# Patient Record
Sex: Female | Born: 1945
Health system: Southern US, Community
[De-identification: ages and names within clinical notes are randomized; demographics above are authoritative.]

## PROBLEM LIST (undated history)

## (undated) DIAGNOSIS — S322XXA Fracture of coccyx, initial encounter for closed fracture: Secondary | ICD-10-CM

## (undated) DIAGNOSIS — F329 Major depressive disorder, single episode, unspecified: Secondary | ICD-10-CM

## (undated) DIAGNOSIS — R519 Headache, unspecified: Secondary | ICD-10-CM

## (undated) DIAGNOSIS — F32A Depression, unspecified: Secondary | ICD-10-CM

## (undated) DIAGNOSIS — I10 Essential (primary) hypertension: Secondary | ICD-10-CM

## (undated) DIAGNOSIS — Z87442 Personal history of urinary calculi: Secondary | ICD-10-CM

## (undated) DIAGNOSIS — K219 Gastro-esophageal reflux disease without esophagitis: Secondary | ICD-10-CM

## (undated) DIAGNOSIS — R51 Headache: Secondary | ICD-10-CM

## (undated) HISTORY — PX: APPENDECTOMY: SHX54

## (undated) HISTORY — PX: TONSILLECTOMY: SUR1361

## (undated) HISTORY — PX: ABDOMINAL HYSTERECTOMY: SHX81

---

## 1998-12-23 ENCOUNTER — Emergency Department (HOSPITAL_COMMUNITY): Admission: EM | Admit: 1998-12-23 | Discharge: 1998-12-23 | Payer: Self-pay | Admitting: Emergency Medicine

## 1998-12-24 ENCOUNTER — Encounter: Payer: Self-pay | Admitting: Emergency Medicine

## 1999-06-10 ENCOUNTER — Other Ambulatory Visit: Admission: RE | Admit: 1999-06-10 | Discharge: 1999-06-10 | Payer: Self-pay | Admitting: Gastroenterology

## 1999-12-23 ENCOUNTER — Other Ambulatory Visit: Admission: RE | Admit: 1999-12-23 | Discharge: 1999-12-23 | Payer: Self-pay | Admitting: Obstetrics and Gynecology

## 2001-06-18 ENCOUNTER — Other Ambulatory Visit: Admission: RE | Admit: 2001-06-18 | Discharge: 2001-06-18 | Payer: Self-pay | Admitting: Obstetrics and Gynecology

## 2001-10-29 ENCOUNTER — Emergency Department (HOSPITAL_COMMUNITY): Admission: EM | Admit: 2001-10-29 | Discharge: 2001-10-29 | Payer: Self-pay | Admitting: *Deleted

## 2001-10-29 ENCOUNTER — Encounter: Payer: Self-pay | Admitting: Emergency Medicine

## 2002-11-17 ENCOUNTER — Encounter: Payer: Self-pay | Admitting: Obstetrics and Gynecology

## 2002-11-17 ENCOUNTER — Ambulatory Visit (HOSPITAL_COMMUNITY): Admission: RE | Admit: 2002-11-17 | Discharge: 2002-11-17 | Payer: Self-pay | Admitting: Obstetrics and Gynecology

## 2012-08-24 ENCOUNTER — Emergency Department (HOSPITAL_COMMUNITY): Payer: Medicare Other

## 2012-08-24 ENCOUNTER — Observation Stay (HOSPITAL_COMMUNITY): Payer: Medicare Other

## 2012-08-24 ENCOUNTER — Observation Stay (HOSPITAL_COMMUNITY)
Admission: EM | Admit: 2012-08-24 | Discharge: 2012-08-28 | Disposition: A | Discharge status: Skilled Nursing Facility | Payer: Medicare Other | Attending: Internal Medicine | Admitting: Internal Medicine

## 2012-08-24 ENCOUNTER — Encounter (HOSPITAL_COMMUNITY): Payer: Self-pay | Admitting: *Deleted

## 2012-08-24 DIAGNOSIS — G43909 Migraine, unspecified, not intractable, without status migrainosus: Secondary | ICD-10-CM | POA: Diagnosis present

## 2012-08-24 DIAGNOSIS — Y92009 Unspecified place in unspecified non-institutional (private) residence as the place of occurrence of the external cause: Secondary | ICD-10-CM

## 2012-08-24 DIAGNOSIS — IMO0002 Reserved for concepts with insufficient information to code with codable children: Secondary | ICD-10-CM | POA: Insufficient documentation

## 2012-08-24 DIAGNOSIS — S329XXA Fracture of unspecified parts of lumbosacral spine and pelvis, initial encounter for closed fracture: Secondary | ICD-10-CM

## 2012-08-24 DIAGNOSIS — W07XXXA Fall from chair, initial encounter: Secondary | ICD-10-CM | POA: Insufficient documentation

## 2012-08-24 DIAGNOSIS — S32592A Other specified fracture of left pubis, initial encounter for closed fracture: Secondary | ICD-10-CM

## 2012-08-24 DIAGNOSIS — I10 Essential (primary) hypertension: Secondary | ICD-10-CM | POA: Diagnosis present

## 2012-08-24 DIAGNOSIS — F324 Major depressive disorder, single episode, in partial remission: Secondary | ICD-10-CM | POA: Diagnosis present

## 2012-08-24 DIAGNOSIS — S32409A Unspecified fracture of unspecified acetabulum, initial encounter for closed fracture: Principal | ICD-10-CM | POA: Insufficient documentation

## 2012-08-24 DIAGNOSIS — M810 Age-related osteoporosis without current pathological fracture: Secondary | ICD-10-CM | POA: Diagnosis present

## 2012-08-24 DIAGNOSIS — S32509A Unspecified fracture of unspecified pubis, initial encounter for closed fracture: Secondary | ICD-10-CM | POA: Insufficient documentation

## 2012-08-24 DIAGNOSIS — M25559 Pain in unspecified hip: Secondary | ICD-10-CM | POA: Insufficient documentation

## 2012-08-24 DIAGNOSIS — W19XXXA Unspecified fall, initial encounter: Secondary | ICD-10-CM

## 2012-08-24 DIAGNOSIS — N289 Disorder of kidney and ureter, unspecified: Secondary | ICD-10-CM | POA: Diagnosis present

## 2012-08-24 LAB — BASIC METABOLIC PANEL
Chloride: 106 mEq/L (ref 96–112)
GFR calc Af Amer: 49 mL/min — ABNORMAL LOW (ref >90)
Potassium: 3.9 mEq/L (ref 3.5–5.1)

## 2012-08-24 LAB — CBC WITH DIFFERENTIAL/PLATELET
Basophils Absolute: 0 10*3/uL (ref 0.0–0.1)
Basophils Relative: 0 % (ref 0–1)
HCT: 37.5 % (ref 36.0–46.0)
Hemoglobin: 12.8 g/dL (ref 12.0–15.0)
Lymphocytes Relative: 8 % — ABNORMAL LOW (ref 12–46)
Monocytes Relative: 6 % (ref 3–12)
Neutro Abs: 8.5 10*3/uL — ABNORMAL HIGH (ref 1.7–7.7)
RBC: 4.01 MIL/uL (ref 3.87–5.11)
RDW: 13.2 % (ref 11.5–15.5)
WBC: 10 10*3/uL (ref 4.0–10.5)

## 2012-08-24 LAB — PROTIME-INR: INR: 1.05 (ref 0.00–1.49)

## 2012-08-24 LAB — ABO/RH: ABO/RH(D): A POS

## 2012-08-24 LAB — TYPE AND SCREEN: ABO/RH(D): A POS

## 2012-08-24 MED ORDER — ONDANSETRON HCL 4 MG/2ML IJ SOLN
4.0000 mg | Freq: Once | INTRAMUSCULAR | Status: AC | PRN
  Administered 2012-08-24: 4 mg via INTRAVENOUS
  Filled 2012-08-24: qty 2

## 2012-08-24 MED ORDER — HYDROMORPHONE HCL PF 1 MG/ML IJ SOLN
0.5000 mg | INTRAMUSCULAR | Status: AC | PRN
  Administered 2012-08-24 (×2): 0.5 mg via INTRAVENOUS
  Filled 2012-08-24 (×2): qty 1

## 2012-08-24 NOTE — ED Notes (Signed)
Pt was standing on chair trying to get something out of cabinet and the chair slipped and patient fell on her butt.  No LOC or head injury

## 2012-08-24 NOTE — H&P (Signed)
PCP:   Kevan Ny   Chief Complaint:  fell  HPI: 67 yo healthy female was on a step stool in her kitchen trying to get something when she tripped and fell off the stool and landed on her buttocks.  Her lower back and buttocks are hurting.  No head trauma, no loc.  No recent illnesses.  Husband is present with her.  She has had some confusion in the ED since arrival therefore a ct head was done to r/o any acute pathology which it did.  Husband says she has been having bouts of "confusion" for about a year now, has not been evaluated fully he thinks she has dementia but again not formally evaluated yet.  Review of Systems:  Positive and negative as per HPI otherwise all other systems are negative  Past Medical History: History reviewed. No pertinent past medical history. Past Surgical History  Procedure Laterality Date  . Abdominal hysterectomy      Medications: Prior to Admission medications   Medication Sig Start Date End Date Taking? Authorizing Provider  ARIPiprazole (ABILIFY) 5 MG tablet Take 2.5 mg by mouth daily.   Yes Historical Provider, MD  Aspirin-Acetaminophen-Caffeine (GOODY HEADACHE PO) Take 1 packet by mouth daily as needed (for pain).   Yes Historical Provider, MD  atenolol (TENORMIN) 50 MG tablet Take 50 mg by mouth daily.   Yes Historical Provider, MD  clonazePAM (KLONOPIN) 1 MG tablet Take 1 mg by mouth 3 (three) times daily as needed for anxiety.   Yes Historical Provider, MD  cloNIDine (CATAPRES) 0.1 MG tablet Take 0.1 mg by mouth 2 (two) times daily.   Yes Historical Provider, MD  FLUoxetine (PROZAC) 20 MG capsule Take 20 mg by mouth daily.   Yes Historical Provider, MD  temazepam (RESTORIL) 15 MG capsule Take 15 mg by mouth at bedtime as needed for sleep.   Yes Historical Provider, MD  topiramate (TOPAMAX) 100 MG tablet Take 150 mg by mouth at bedtime. For headaches   Yes Historical Provider, MD  traMADol (ULTRAM) 50 MG tablet Take 50 mg by mouth every 8 (eight) hours  as needed for pain.    Yes Historical Provider, MD    Allergies:  No Known Allergies  Social History:  reports that she has quit smoking. She does not have any smokeless tobacco history on file. She reports that she does not drink alcohol or use illicit drugs.  Family History: negative  Physical Exam: Filed Vitals:   08/24/12 2100 08/24/12 2115 08/24/12 2130 08/24/12 2212  BP: 124/73 128/82 117/65 137/68  Pulse: 89 90 88 90  Temp:    99.6 F (37.6 C)  TempSrc:    Oral  Resp:    16  SpO2: 92% 92% 92% 94%   General appearance: alert, cooperative and no distress Head: Normocephalic, without obvious abnormality, atraumatic Eyes: negative Nose: Nares normal. Septum midline. Mucosa normal. No drainage or sinus tenderness. Neck: no JVD and supple, symmetrical, trachea midline Lungs: clear to auscultation bilaterally Heart: regular rate and rhythm, S1, S2 normal, no murmur, click, rub or gallop Abdomen: soft, non-tender; bowel sounds normal; no masses,  no organomegaly Extremities: extremities normal, atraumatic, no cyanosis or edema Pulses: 2+ and symmetric Skin: Skin color, texture, turgor normal. No rashes or lesions Neurologic: Grossly normal MSK:  Pelvic stable.  Pain with movement of left hip/thigh/knee    Labs on Admission:   Recent Labs  08/24/12 1930  NA 137  K 3.9  CL 106  CO2 21  GLUCOSE 99  BUN 13  CREATININE 1.29*  CALCIUM 9.3    Recent Labs  08/24/12 1930  WBC 10.0  NEUTROABS 8.5*  HGB 12.8  HCT 37.5  MCV 93.5  PLT 122*    Radiological Exams on Admission: Dg Chest 1 View  08/24/2012  *RADIOLOGY REPORT*  Clinical Data: Fall.  CHEST - 1 VIEW  Comparison: None.  Findings: Remote trauma of posterolateral lower left ribs.  Minimal S-shaped thoracic spine curvature. Midline trachea.  Normal heart size with a tortuous descending thoracic aorta. No pleural effusion or pneumothorax.  Clear lungs.  IMPRESSION: No acute cardiopulmonary disease.   Original  Report Authenticated By: Jeronimo Greaves, M.D.    Dg Thoracic Spine 2 View  08/24/2012  *RADIOLOGY REPORT*  Clinical Data: Trauma and pain.  THORACIC SPINE - 2 VIEW  Comparison: None.  Findings: Minimal S-shaped thoracic spine curvature.  Remote left rib trauma.  The lateral view images from approximately the top of T3 through bottom of L2.  Borderline to minimal loss of thoracic vertebral height at multiple levels, including approximately T6, T7, T8, and T10.  These are of indeterminate acuity.  No canal compromise.  Attempted swimmer's view is nondiagnostic.  IMPRESSION: Incomplete evaluation of the upper thoracic spine.  Borderline/minimal loss of thoracic vertebral body height at multiple levels.  This is of indeterminate acuity.  Correlate with point tenderness.   Original Report Authenticated By: Jeronimo Greaves, M.D.    Dg Lumbar Spine Complete  08/24/2012  *RADIOLOGY REPORT*  Clinical Data: Back  pain secondary to a fall.  LUMBAR SPINE - COMPLETE 4+ VIEW  Comparison: None.  Findings: There is no fracture, subluxation, disc space narrowing, facet arthritis, or other abnormality of the lumbar spine.   However, the patient does have fractures of the left inferior and superior pubic rami. I suspect the patient has a fracture of the left sacral ala.  Calcification in the abdominal aorta. There is a slight lumbar curvature which could be positional.  IMPRESSION: Acute fractures of the left inferior and superior pubic rami.  I suspect the patient has a fracture of the left side of the sacrum.  No significant abnormality of the lumbar spine.   Original Report Authenticated By: Francene Boyers, M.D.    Dg Hip Complete Left  08/24/2012  *RADIOLOGY REPORT*  Clinical Data: Trauma and pain.  LEFT HIP - COMPLETE 2+ VIEW  Comparison: Femur films same date  Findings: AP view pelvis and AP/frog-leg views of left hip. Femoral heads are located.  Extensive soft tissue calcifications.  No proximal femoral fracture.  Lucency  through the left inferior pubic ramus,  acute fracture cannot be excluded.  There is also osseous irregularity about the parasymphyseal region bilaterally.  This could be degenerative or post-traumatic.  IMPRESSION: Probable left inferior pubic ramus fracture.  Concurrent parasymphyseal irregularity bilaterally which could be post- traumatic or degenerative.   Original Report Authenticated By: Jeronimo Greaves, M.D.    Dg Femur Left  08/24/2012  *RADIOLOGY REPORT*  Clinical Data: Fall today with pain.  LEFT FEMUR - 2 VIEW  Comparison: Hip films same date  Findings: Soft tissue calcifications about the proximal left femur. No acute fracture or dislocation.  No knee joint effusion.  IMPRESSION: No acute osseous abnormality.   Original Report Authenticated By: Jeronimo Greaves, M.D.    Dg Tibia/fibula Left  08/24/2012  *RADIOLOGY REPORT*  Clinical Data: Trauma and pain.  LEFT TIBIA AND FIBULA - 2 VIEW  Comparison: Femur films same date  Findings: Mild degenerative irregularity of the medial compartment of the knee.  No fracture about the tibia or fibula.  IMPRESSION: No acute osseous abnormality.   Original Report Authenticated By: Jeronimo Greaves, M.D.    Ct Head Wo Contrast  08/24/2012  *RADIOLOGY REPORT*  Clinical Data: 67 year old female with fall and injury and pain.  CT HEAD WITHOUT CONTRAST  Technique:  Contiguous axial images were obtained from the base of the skull through the vertex without contrast.  Comparison: None  Findings: Bilateral basal ganglia infarcts are identified - age indeterminate but appear remote. Mild chronic small vessel white matter ischemic changes are identified. No acute intracranial abnormalities are identified, including mass lesion or mass effect, hydrocephalus, extra-axial fluid collection, midline shift, hemorrhage, or acute infarction.  The visualized bony calvarium is unremarkable.  IMPRESSION: No evidence of acute intracranial abnormality.  Remote appearing bilateral basal ganglia  lacunar infarcts and mild chronic small vessel white matter ischemic changes.   Original Report Authenticated By: Harmon Pier, M.D.     Assessment/Plan 67 yo female mechanical fall with pelvic fracture  Principal Problem:   Pelvic fracture Active Problems:   Fall at home   Migraines   Renal insufficiency, mild  obs and place on oral pain meds along with iv dilaudid for breakthrough severe pain.  Ortho has been called and recommended ct pelvis which is pending.  Place on ivf overnight.  PT/OT eval.  Full code.  pcp dr gates message left with tannenbaum.    Mckinzi Eriksen A 08/24/2012, 10:21 PM

## 2012-08-24 NOTE — ED Provider Notes (Signed)
History    CSN: [id number] Arrival date & time 08/24/12  1810 First MD Initiated Contact with Patient 08/24/12 1833     Chief Complaint  Patient presents with  . Fall    HPI Patient presents to the emergency room with complaints of left hip and buttock pain. Patient was standing up on a chair trying to get something out of a cabinet when she slipped and landed on her buttock. Patient did not strike her head or lose consciousness.  Initially she was able to get up and walk around. She when sat down on the couch. A short time after that when she tried to get up she was unable to do so. Patient denies any chest pain or abdominal pain. She denies any numbness or weakness. She has not had any nausea or vomiting. Pain is severe and increases with movement. The patient also has pain in her left lower leg as well as her spine.  History reviewed. No pertinent past medical history.  Past Surgical History  Procedure Laterality Date  . Abdominal hysterectomy      No family history on file.  History  Substance Use Topics  . Smoking status: Former Games developer  . Smokeless tobacco: Not on file  . Alcohol Use: No    OB History   Grav Para Term Preterm Abortions TAB SAB Ect Mult Living                  Review of Systems  All other systems reviewed and are negative.    Allergies  Review of patient's allergies indicates no known allergies.  Home Medications   Current Outpatient Rx  Name  Route  Sig  Dispense  Refill  . ARIPiprazole (ABILIFY) 5 MG tablet   Oral   Take 2.5 mg by mouth daily.         . Aspirin-Acetaminophen-Caffeine (GOODY HEADACHE PO)   Oral   Take 1 packet by mouth daily as needed (for pain).         [name] atenolol (TENORMIN) 50 MG tablet   Oral   Take 50 mg by mouth daily.         . clonazePAM (KLONOPIN) 1 MG tablet   Oral   Take 1 mg by mouth 3 (three) times daily as needed for anxiety.         . cloNIDine (CATAPRES) 0.1 MG tablet   Oral   Take 0.1 mg by  mouth 2 (two) times daily.         [name] FLUoxetine (PROZAC) 20 MG capsule   Oral   Take 20 mg by mouth daily.         . temazepam (RESTORIL) 15 MG capsule   Oral   Take 15 mg by mouth at bedtime as needed for sleep.         [name] topiramate (TOPAMAX) 100 MG tablet   Oral   Take 150 mg by mouth at bedtime. For headaches         . traMADol (ULTRAM) 50 MG tablet   Oral   Take 50 mg by mouth every 8 (eight) hours as needed for pain.            BP 128/73  Pulse 95  Temp(Src) 98.5 F (36.9 C) (Oral)  SpO2 95%  Physical Exam  Nursing note and vitals reviewed. Constitutional: She appears well-developed and well-nourished. No distress.  HENT:  Head: Normocephalic and atraumatic.  Right Ear: External ear normal.  Left Ear: External  ear normal.  Eyes: Conjunctivae are normal. Right eye exhibits no discharge. Left eye exhibits no discharge. No scleral icterus.  Neck: Neck supple. No tracheal deviation present.  Cardiovascular: Normal rate, regular rhythm and intact distal pulses.   Pulmonary/Chest: Effort normal and breath sounds normal. No stridor. No respiratory distress. She has no wheezes. She has no rales.  Abdominal: Soft. Bowel sounds are normal. She exhibits no distension. There is no tenderness. There is no rebound and no guarding.  Musculoskeletal: She exhibits no edema and no tenderness.       Right shoulder: Normal.       Left shoulder: Normal.       Right elbow: Normal.      Left elbow: Normal.       Right wrist: Normal.       Left wrist: Normal.       Left hip: She exhibits tenderness and bony tenderness. She exhibits no swelling, no crepitus and no deformity.       Cervical back: Normal. She exhibits no tenderness.       Thoracic back: She exhibits tenderness. She exhibits no swelling, no edema and no deformity.       Lumbar back: She exhibits tenderness. She exhibits no swelling, no edema and no deformity.       Left lower leg: She exhibits tenderness.  No  deformity or swelling noted in all 4 extremities,   Neurological: She is alert. She has normal strength. She displays tremor. No sensory deficit. Cranial nerve deficit:  no gross defecits noted. She exhibits normal muscle tone. She displays no seizure activity. Coordination normal.  Skin: Skin is warm and dry. No rash noted.  Psychiatric: She has a normal mood and affect.    ED Course  Procedures (including critical care time)  Labs Reviewed  BASIC METABOLIC PANEL - Abnormal; Notable for the following:    Creatinine, Ser 1.29 (*)    GFR calc non Af Amer 42 (*)    GFR calc Af Amer 49 (*)    All other components within normal limits  CBC WITH DIFFERENTIAL - Abnormal; Notable for the following:    Platelets 122 (*)    Neutrophils Relative 85 (*)    Lymphocytes Relative 8 (*)    Neutro Abs 8.5 (*)    All other components within normal limits  PROTIME-INR  TYPE AND SCREEN  ABO/RH   Dg Chest 1 View  08/24/2012  *RADIOLOGY REPORT*  Clinical Data: Fall.  CHEST - 1 VIEW  Comparison: None.  Findings: Remote trauma of posterolateral lower left ribs.  Minimal S-shaped thoracic spine curvature. Midline trachea.  Normal heart size with a tortuous descending thoracic aorta. No pleural effusion or pneumothorax.  Clear lungs.  IMPRESSION: No acute cardiopulmonary disease.   Original Report Authenticated By: Jeronimo Greaves, M.D.    Dg Thoracic Spine 2 View  08/24/2012  *RADIOLOGY REPORT*  Clinical Data: Trauma and pain.  THORACIC SPINE - 2 VIEW  Comparison: None.  Findings: Minimal S-shaped thoracic spine curvature.  Remote left rib trauma.  The lateral view images from approximately the top of T3 through bottom of L2.  Borderline to minimal loss of thoracic vertebral height at multiple levels, including approximately T6, T7, T8, and T10.  These are of indeterminate acuity.  No canal compromise.  Attempted swimmer's view is nondiagnostic.  IMPRESSION: Incomplete evaluation of the upper thoracic spine.   Borderline/minimal loss of thoracic vertebral body height at multiple levels.  This is of indeterminate  acuity.  Correlate with point tenderness.   Original Report Authenticated By: Jeronimo Greaves, M.D.    Dg Lumbar Spine Complete  08/24/2012  *RADIOLOGY REPORT*  Clinical Data: Back  pain secondary to a fall.  LUMBAR SPINE - COMPLETE 4+ VIEW  Comparison: None.  Findings: There is no fracture, subluxation, disc space narrowing, facet arthritis, or other abnormality of the lumbar spine.   However, the patient does have fractures of the left inferior and superior pubic rami. I suspect the patient has a fracture of the left sacral ala.  Calcification in the abdominal aorta. There is a slight lumbar curvature which could be positional.  IMPRESSION: Acute fractures of the left inferior and superior pubic rami.  I suspect the patient has a fracture of the left side of the sacrum.  No significant abnormality of the lumbar spine.   Original Report Authenticated By: Francene Boyers, M.D.    Dg Hip Complete Left  08/24/2012  *RADIOLOGY REPORT*  Clinical Data: Trauma and pain.  LEFT HIP - COMPLETE 2+ VIEW  Comparison: Femur films same date  Findings: AP view pelvis and AP/frog-leg views of left hip. Femoral heads are located.  Extensive soft tissue calcifications.  No proximal femoral fracture.  Lucency through the left inferior pubic ramus,  acute fracture cannot be excluded.  There is also osseous irregularity about the parasymphyseal region bilaterally.  This could be degenerative or post-traumatic.  IMPRESSION: Probable left inferior pubic ramus fracture.  Concurrent parasymphyseal irregularity bilaterally which could be post- traumatic or degenerative.   Original Report Authenticated By: Jeronimo Greaves, M.D.    Dg Femur Left  08/24/2012  *RADIOLOGY REPORT*  Clinical Data: Fall today with pain.  LEFT FEMUR - 2 VIEW  Comparison: Hip films same date  Findings: Soft tissue calcifications about the proximal left femur. No  acute fracture or dislocation.  No knee joint effusion.  IMPRESSION: No acute osseous abnormality.   Original Report Authenticated By: Jeronimo Greaves, M.D.    Dg Tibia/fibula Left  08/24/2012  *RADIOLOGY REPORT*  Clinical Data: Trauma and pain.  LEFT TIBIA AND FIBULA - 2 VIEW  Comparison: Femur films same date  Findings: Mild degenerative irregularity of the medial compartment of the knee.  No fracture about the tibia or fibula.  IMPRESSION: No acute osseous abnormality.   Original Report Authenticated By: Jeronimo Greaves, M.D.    Ct Head Wo Contrast  08/24/2012  *RADIOLOGY REPORT*  Clinical Data: 67 year old female with fall and injury and pain.  CT HEAD WITHOUT CONTRAST  Technique:  Contiguous axial images were obtained from the base of the skull through the vertex without contrast.  Comparison: None  Findings: Bilateral basal ganglia infarcts are identified - age indeterminate but appear remote. Mild chronic small vessel white matter ischemic changes are identified. No acute intracranial abnormalities are identified, including mass lesion or mass effect, hydrocephalus, extra-axial fluid collection, midline shift, hemorrhage, or acute infarction.  The visualized bony calvarium is unremarkable.  IMPRESSION: No evidence of acute intracranial abnormality.  Remote appearing bilateral basal ganglia lacunar infarcts and mild chronic small vessel white matter ischemic changes.   Original Report Authenticated By: Harmon Pier, M.D.      1. Bilateral pubic rami fractures, closed, initial encounter       MDM  Cases discussed with Dr Charlann Boxer.  Will add on pelvic ct to further characterize her injuries.  Plan on admission for pain management.  Pt will need pt and possibly ot.        Cletis Athens  Wannetta Sender, MD 08/24/12 2128

## 2012-08-24 NOTE — ED Notes (Signed)
Pt is having mid buttocks to left hip area pain fro fall

## 2012-08-24 NOTE — ED Notes (Signed)
Dr. David at bedside. 

## 2012-08-25 DIAGNOSIS — M810 Age-related osteoporosis without current pathological fracture: Secondary | ICD-10-CM | POA: Diagnosis present

## 2012-08-25 DIAGNOSIS — F324 Major depressive disorder, single episode, in partial remission: Secondary | ICD-10-CM | POA: Diagnosis present

## 2012-08-25 DIAGNOSIS — I10 Essential (primary) hypertension: Secondary | ICD-10-CM | POA: Diagnosis present

## 2012-08-25 LAB — BASIC METABOLIC PANEL
BUN: 17 mg/dL (ref 6–23)
CO2: 24 mEq/L (ref 19–32)
Chloride: 107 mEq/L (ref 96–112)
Creatinine, Ser: 1.39 mg/dL — ABNORMAL HIGH (ref 0.50–1.10)
Glucose, Bld: 102 mg/dL — ABNORMAL HIGH (ref 70–99)
Potassium: 3.6 mEq/L (ref 3.5–5.1)

## 2012-08-25 LAB — CBC
HCT: 34.1 % — ABNORMAL LOW (ref 36.0–46.0)
Hemoglobin: 11.8 g/dL — ABNORMAL LOW (ref 12.0–15.0)
MCH: 32.6 pg (ref 26.0–34.0)
MCHC: 34.6 g/dL (ref 30.0–36.0)
MCV: 94.2 fL (ref 78.0–100.0)
RDW: 13.4 % (ref 11.5–15.5)

## 2012-08-25 MED ORDER — CLONAZEPAM 1 MG PO TABS
1.0000 mg | ORAL_TABLET | Freq: Two times a day (BID) | ORAL | Status: DC
  Administered 2012-08-25 – 2012-08-28 (×6): 1 mg via ORAL
  Filled 2012-08-25 (×6): qty 1

## 2012-08-25 MED ORDER — HYDROCODONE-ACETAMINOPHEN 5-325 MG PO TABS
1.0000 | ORAL_TABLET | ORAL | Status: DC | PRN
  Administered 2012-08-25 – 2012-08-28 (×10): 2 via ORAL
  Filled 2012-08-25 (×10): qty 2

## 2012-08-25 MED ORDER — ONDANSETRON HCL 4 MG PO TABS: 4.0000 mg | ORAL_TABLET | Freq: Four times a day (QID) | ORAL | Status: DC | PRN

## 2012-08-25 MED ORDER — TEMAZEPAM 15 MG PO CAPS
15.0000 mg | ORAL_CAPSULE | Freq: Every evening | ORAL | Status: DC | PRN
  Administered 2012-08-26 – 2012-08-27 (×2): 15 mg via ORAL
  Filled 2012-08-25 (×2): qty 1

## 2012-08-25 MED ORDER — FLUOXETINE HCL 20 MG PO CAPS
20.0000 mg | ORAL_CAPSULE | Freq: Every day | ORAL | Status: DC
  Administered 2012-08-25 – 2012-08-28 (×4): 20 mg via ORAL
  Filled 2012-08-25 (×4): qty 1

## 2012-08-25 MED ORDER — ENOXAPARIN SODIUM 40 MG/0.4ML ~~LOC~~ SOLN
40.0000 mg | SUBCUTANEOUS | Status: DC
  Administered 2012-08-25 – 2012-08-28 (×4): 40 mg via SUBCUTANEOUS
  Filled 2012-08-25 (×4): qty 0.4

## 2012-08-25 MED ORDER — ATENOLOL 50 MG PO TABS
50.0000 mg | ORAL_TABLET | Freq: Every day | ORAL | Status: DC
  Administered 2012-08-25 – 2012-08-28 (×4): 50 mg via ORAL
  Filled 2012-08-25 (×4): qty 1

## 2012-08-25 MED ORDER — CLONIDINE HCL 0.1 MG PO TABS
0.1000 mg | ORAL_TABLET | Freq: Two times a day (BID) | ORAL | Status: DC
  Administered 2012-08-25 – 2012-08-28 (×8): 0.1 mg via ORAL
  Filled 2012-08-25 (×9): qty 1

## 2012-08-25 MED ORDER — ARIPIPRAZOLE 5 MG PO TABS
2.5000 mg | ORAL_TABLET | Freq: Every day | ORAL | Status: DC
  Administered 2012-08-25 – 2012-08-28 (×4): 2.5 mg via ORAL
  Filled 2012-08-25 (×4): qty 1

## 2012-08-25 MED ORDER — LORAZEPAM 2 MG/ML IJ SOLN
1.0000 mg | Freq: Four times a day (QID) | INTRAMUSCULAR | Status: DC | PRN
  Administered 2012-08-26: 1 mg via INTRAVENOUS
  Filled 2012-08-25: qty 1

## 2012-08-25 MED ORDER — POLYETHYLENE GLYCOL 3350 17 G PO PACK
17.0000 g | PACK | Freq: Two times a day (BID) | ORAL | Status: DC
  Administered 2012-08-25 – 2012-08-28 (×5): 17 g via ORAL
  Filled 2012-08-25 (×8): qty 1

## 2012-08-25 MED ORDER — CALCITONIN (SALMON) 200 UNIT/ACT NA SOLN
1.0000 | Freq: Every day | NASAL | Status: DC
  Administered 2012-08-25 – 2012-08-28 (×4): 1 via NASAL
  Filled 2012-08-25 (×2): qty 3.7

## 2012-08-25 MED ORDER — HYDROMORPHONE HCL PF 1 MG/ML IJ SOLN
1.0000 mg | INTRAMUSCULAR | Status: DC | PRN
  Administered 2012-08-25 – 2012-08-28 (×5): 1 mg via INTRAVENOUS
  Filled 2012-08-25 (×5): qty 1

## 2012-08-25 MED ORDER — HYDROMORPHONE HCL PF 1 MG/ML IJ SOLN: 1.0000 mg | INTRAMUSCULAR | Status: AC | PRN

## 2012-08-25 MED ORDER — SODIUM CHLORIDE 0.9 % IV SOLN
INTRAVENOUS | Status: AC
  Administered 2012-08-25: 02:00:00 via INTRAVENOUS

## 2012-08-25 MED ORDER — TOPIRAMATE 25 MG PO TABS
150.0000 mg | ORAL_TABLET | Freq: Every day | ORAL | Status: DC
  Administered 2012-08-25 – 2012-08-27 (×3): 150 mg via ORAL
  Filled 2012-08-25 (×5): qty 2

## 2012-08-25 MED ORDER — HYDROMORPHONE HCL PF 1 MG/ML IJ SOLN
0.5000 mg | INTRAMUSCULAR | Status: DC | PRN
  Administered 2012-08-25: 0.5 mg via INTRAVENOUS
  Filled 2012-08-25: qty 1

## 2012-08-25 MED ORDER — SENNOSIDES-DOCUSATE SODIUM 8.6-50 MG PO TABS
1.0000 | ORAL_TABLET | Freq: Two times a day (BID) | ORAL | Status: DC
  Administered 2012-08-25 – 2012-08-28 (×6): 1 via ORAL
  Filled 2012-08-25 (×6): qty 1

## 2012-08-25 MED ORDER — ONDANSETRON HCL 4 MG/2ML IJ SOLN: 4.0000 mg | Freq: Three times a day (TID) | INTRAMUSCULAR | Status: AC | PRN

## 2012-08-25 MED ORDER — ONDANSETRON HCL 4 MG/2ML IJ SOLN: 4.0000 mg | Freq: Four times a day (QID) | INTRAMUSCULAR | Status: DC | PRN

## 2012-08-25 NOTE — Progress Notes (Signed)
Subjective: [name] is a 67 year old patient of mine for many years who has a history of severe depression migraine headaches insomnia and anxiety as well as osteoporosis.  She was standing on a chair in the kitchen trying to get something out of the cabinet and slipped off the chair that she was standing on and landed on her buttock.  She is having some low back pain and left pelvic pain and has 4 fractures including the inferior and superior pubic rami on the left, a partial acetabular fracture on the left, and a left sacral fracture.  She will require extensive physical therapy prior to returning home almost certainly.  Otherwise she is feeling okay but feels like her pain medicine needs to be stronger.  Objective: Weight change:  No intake or output data in the 24 hours ending 08/25/12 0801 Filed Vitals:   08/24/12 2212 08/24/12 2215 08/25/12 0121 08/25/12 0553  BP: 137/68 128/70 119/73 106/67  Pulse: 90 86 84 78  Temp: 99.6 F (37.6 C)  98.5 F (36.9 C) 99.3 F (37.4 C)  TempSrc: Oral  Oral Oral  Resp: 16  16 20   Height:   5\' 2"  (1.575 m)   Weight:   50.395 kg (111 lb 1.6 oz)   SpO2: 94% 92% 92% 90%    General Appearance: Alert, cooperative, no distress, appears stated age Lungs: Clear to auscultation bilaterally, respirations unlabored Heart: Regular rate and rhythm, S1 and S2 normal, no murmur, rub or gallop Abdomen: Soft, non-tender, bowel sounds active all four quadrants, no masses, no organomegaly Extremities: Extremities normal, atraumatic, no cyanosis.  Slight swelling in the left inguinal area but no ecchymosis. Neuro: Oriented x3, nonfocal  Lab Results: Results for orders placed during the hospital encounter of 08/24/12 (from the past 48 hour(s))  BASIC METABOLIC PANEL     Status: Abnormal   Collection Time    08/24/12  7:30 PM      Result Value Range   Sodium 137  135 - 145 mEq/L   Potassium 3.9  3.5 - 5.1 mEq/L   Chloride 106  96 - 112 mEq/L   CO2 21   19 - 32 mEq/L   Glucose, Bld 99  70 - 99 mg/dL   BUN 13  6 - 23 mg/dL   Creatinine, Ser 6.21 (*) 0.50 - 1.10 mg/dL   Calcium 9.3  8.4 - 30.8 mg/dL   GFR calc non Af Amer 42 (*) >90 mL/min   GFR calc Af Amer 49 (*) >90 mL/min   Comment:            The eGFR has been calculated     using the CKD EPI equation.     This calculation has not been     validated in all clinical     situations.     eGFR's persistently     <90 mL/min signify     possible Chronic Kidney Disease.  CBC WITH DIFFERENTIAL     Status: Abnormal   Collection Time    08/24/12  7:30 PM      Result Value Range   WBC 10.0  4.0 - 10.5 K/uL   RBC 4.01  3.87 - 5.11 MIL/uL   Hemoglobin 12.8  12.0 - 15.0 g/dL   HCT 65.7  84.6 - 96.2 %   MCV 93.5  78.0 - 100.0 fL   MCH 31.9  26.0 - 34.0 pg   MCHC 34.1  30.0 - 36.0 g/dL   RDW 95.2  11.5 - 15.5 %   Platelets 122 (*) 150 - 400 K/uL   Neutrophils Relative 85 (*) 43 - 77 %   Lymphocytes Relative 8 (*) 12 - 46 %   Monocytes Relative 6  3 - 12 %   Eosinophils Relative 1  0 - 5 %   Basophils Relative 0  0 - 1 %   Neutro Abs 8.5 (*) 1.7 - 7.7 K/uL   Lymphs Abs 0.8  0.7 - 4.0 K/uL   Monocytes Absolute 0.6  0.1 - 1.0 K/uL   Eosinophils Absolute 0.1  0.0 - 0.7 K/uL   Basophils Absolute 0.0  0.0 - 0.1 K/uL   Smear Review MORPHOLOGY UNREMARKABLE    PROTIME-INR     Status: None   Collection Time    08/24/12  7:30 PM      Result Value Range   Prothrombin Time 13.6  11.6 - 15.2 seconds   INR 1.05  0.00 - 1.49  TYPE AND SCREEN     Status: None   Collection Time    08/24/12  7:30 PM      Result Value Range   ABO/RH(D) A POS     Antibody Screen NEG     Sample Expiration 08/27/2012    ABO/RH     Status: None   Collection Time    08/24/12  7:30 PM      Result Value Range   ABO/RH(D) A POS    BASIC METABOLIC PANEL     Status: Abnormal   Collection Time    08/25/12  4:40 AM      Result Value Range   Sodium 138  135 - 145 mEq/L   Potassium 3.6  3.5 - 5.1 mEq/L   Chloride  107  96 - 112 mEq/L   CO2 24  19 - 32 mEq/L   Glucose, Bld 102 (*) 70 - 99 mg/dL   BUN 17  6 - 23 mg/dL   Creatinine, Ser 4.78 (*) 0.50 - 1.10 mg/dL   Calcium 8.8  8.4 - 29.5 mg/dL   GFR calc non Af Amer 39 (*) >90 mL/min   GFR calc Af Amer 45 (*) >90 mL/min   Comment:            The eGFR has been calculated     using the CKD EPI equation.     This calculation has not been     validated in all clinical     situations.     eGFR's persistently     <90 mL/min signify     possible Chronic Kidney Disease.  CBC     Status: Abnormal   Collection Time    08/25/12  4:40 AM      Result Value Range   WBC 7.3  4.0 - 10.5 K/uL   RBC 3.62 (*) 3.87 - 5.11 MIL/uL   Hemoglobin 11.8 (*) 12.0 - 15.0 g/dL   HCT 62.1 (*) 30.8 - 65.7 %   MCV 94.2  78.0 - 100.0 fL   MCH 32.6  26.0 - 34.0 pg   MCHC 34.6  30.0 - 36.0 g/dL   RDW 84.6  96.2 - 95.2 %   Platelets 113 (*) 150 - 400 K/uL   Comment: PLATELET COUNT CONFIRMED BY SMEAR    Studies/Results: Dg Chest 1 View  08/24/2012  *RADIOLOGY REPORT*  Clinical Data: Fall.  CHEST - 1 VIEW  Comparison: None.  Findings: Remote trauma of posterolateral lower left ribs.  Minimal S-shaped thoracic  spine curvature. Midline trachea.  Normal heart size with a tortuous descending thoracic aorta. No pleural effusion or pneumothorax.  Clear lungs.  IMPRESSION: No acute cardiopulmonary disease.   Original Report Authenticated By: Jeronimo Greaves, M.D.    Dg Thoracic Spine 2 View  08/24/2012  *RADIOLOGY REPORT*  Clinical Data: Trauma and pain.  THORACIC SPINE - 2 VIEW  Comparison: None.  Findings: Minimal S-shaped thoracic spine curvature.  Remote left rib trauma.  The lateral view images from approximately the top of T3 through bottom of L2.  Borderline to minimal loss of thoracic vertebral height at multiple levels, including approximately T6, T7, T8, and T10.  These are of indeterminate acuity.  No canal compromise.  Attempted swimmer's view is nondiagnostic.  IMPRESSION:  Incomplete evaluation of the upper thoracic spine.  Borderline/minimal loss of thoracic vertebral body height at multiple levels.  This is of indeterminate acuity.  Correlate with point tenderness.   Original Report Authenticated By: Jeronimo Greaves, M.D.    Dg Lumbar Spine Complete  08/24/2012  *RADIOLOGY REPORT*  Clinical Data: Back  pain secondary to a fall.  LUMBAR SPINE - COMPLETE 4+ VIEW  Comparison: None.  Findings: There is no fracture, subluxation, disc space narrowing, facet arthritis, or other abnormality of the lumbar spine.   However, the patient does have fractures of the left inferior and superior pubic rami. I suspect the patient has a fracture of the left sacral ala.  Calcification in the abdominal aorta. There is a slight lumbar curvature which could be positional.  IMPRESSION: Acute fractures of the left inferior and superior pubic rami.  I suspect the patient has a fracture of the left side of the sacrum.  No significant abnormality of the lumbar spine.   Original Report Authenticated By: Francene Boyers, M.D.    Dg Hip Complete Left  08/24/2012  *RADIOLOGY REPORT*  Clinical Data: Trauma and pain.  LEFT HIP - COMPLETE 2+ VIEW  Comparison: Femur films same date  Findings: AP view pelvis and AP/frog-leg views of left hip. Femoral heads are located.  Extensive soft tissue calcifications.  No proximal femoral fracture.  Lucency through the left inferior pubic ramus,  acute fracture cannot be excluded.  There is also osseous irregularity about the parasymphyseal region bilaterally.  This could be degenerative or post-traumatic.  IMPRESSION: Probable left inferior pubic ramus fracture.  Concurrent parasymphyseal irregularity bilaterally which could be post- traumatic or degenerative.   Original Report Authenticated By: Jeronimo Greaves, M.D.    Dg Femur Left  08/24/2012  *RADIOLOGY REPORT*  Clinical Data: Fall today with pain.  LEFT FEMUR - 2 VIEW  Comparison: Hip films same date  Findings: Soft tissue  calcifications about the proximal left femur. No acute fracture or dislocation.  No knee joint effusion.  IMPRESSION: No acute osseous abnormality.   Original Report Authenticated By: Jeronimo Greaves, M.D.    Dg Tibia/fibula Left  08/24/2012  *RADIOLOGY REPORT*  Clinical Data: Trauma and pain.  LEFT TIBIA AND FIBULA - 2 VIEW  Comparison: Femur films same date  Findings: Mild degenerative irregularity of the medial compartment of the knee.  No fracture about the tibia or fibula.  IMPRESSION: No acute osseous abnormality.   Original Report Authenticated By: Jeronimo Greaves, M.D.    Ct Head Wo Contrast  08/24/2012  *RADIOLOGY REPORT*  Clinical Data: 67 year old female with fall and injury and pain.  CT HEAD WITHOUT CONTRAST  Technique:  Contiguous axial images were obtained from the base of the skull through the  vertex without contrast.  Comparison: None  Findings: Bilateral basal ganglia infarcts are identified - age indeterminate but appear remote. Mild chronic small vessel white matter ischemic changes are identified. No acute intracranial abnormalities are identified, including mass lesion or mass effect, hydrocephalus, extra-axial fluid collection, midline shift, hemorrhage, or acute infarction.  The visualized bony calvarium is unremarkable.  IMPRESSION: No evidence of acute intracranial abnormality.  Remote appearing bilateral basal ganglia lacunar infarcts and mild chronic small vessel white matter ischemic changes.   Original Report Authenticated By: Harmon Pier, M.D.    Ct Pelvis Wo Contrast  08/24/2012  *RADIOLOGY REPORT*  Clinical Data: Fall, left hip pain  CT PELVIS WITHOUT CONTRAST  Technique:  Multidetector CT imaging of the pelvis was performed following the standard protocol without intravenous contrast.  Comparison: 08/24/2012 lumbar spine radiograph and pelvis radiograph  Findings: Intraperitoneal contents show nothing acute.  Absent uterus.  No adnexal mass.  Bilateral subcutaneous calcifications  are nonspecific.  There is a left sacral fracture adjacent to the SI joint, with a nondominant component extending into the SI joint as seen on series 3 image 65.  No diastases.  Fracture of the anterior column left acetabulum with mild displacement.  The femoral head remains seated within the acetabulum.  No femoral fracture visualized.  Comminuted left inferior pubic ramus fracture. Nondisplaced parasymphyseal superior pubic ramus fracture on the left as seen on coronal image 41.  IMPRESSION: Left sacral ala fracture.  Left acetabular anterior column fracture.  Left superior and inferior pubic rami fractures.  Nonspecific subcutaneous calcifications.   Original Report Authenticated By: Jearld Lesch, M.D.    Medications: Scheduled Meds: . ARIPiprazole  2.5 mg Oral Daily  . atenolol  50 mg Oral Daily  . cloNIDine  0.1 mg Oral BID  . enoxaparin (LOVENOX) injection  40 mg Subcutaneous Q24H  . FLUoxetine  20 mg Oral Daily  . polyethylene glycol  17 g Oral BID  . senna-docusate  1 tablet Oral BID  . topiramate  150 mg Oral QHS   Continuous Infusions: . sodium chloride 75 mL/hr at 08/25/12 0159   PRN Meds:.HYDROcodone-acetaminophen, HYDROmorphone (DILAUDID) injection, HYDROmorphone (DILAUDID) injection, ondansetron (ZOFRAN) IV, ondansetron (ZOFRAN) IV, ondansetron, temazepam  Assessment/Plan: Principal Problem:   Pelvic fracture - 4 fractures noted on x-ray.  Will likely provide reason for long rehabilitation.  Suspect a skilled nursing facility would be her best choice for now for 2-6 weeks of therapy.  Other medical issues are stable currently.  Will start Fortical nasal spray Active Problems:   Fall at home   Migraines   Renal insufficiency, mild   Major depression in partial remission   Osteoporosis, unspecified   Essential hypertension, benign    LOS: 1 day   Pearla Dubonnet, MD 08/25/2012, 8:01 AM

## 2012-08-25 NOTE — Progress Notes (Signed)
Pt admitted to the unit. Pt is alert and oriented. Pt oriented to room, staff, and call bell. Bed in lowest position. Full assessment to Epic. Call bell with in reach. Told to call for assists. Will continue to monitor.  Patricia Stanley  

## 2012-08-25 NOTE — Consult Note (Signed)
Reason for Consult:  Pelvic fracture Referring Physician: Dr. Tarry Kos  Patricia Stanley is an 67 y.o. female.  HPI: Patient presented to the emergency room with complaints of left hip and buttock pain. Patient was standing up on a chair trying to get something out of a cabinet when she slipped and landed on her buttock. Patient told the ED that she did not strike her head or lose consciousness. Initially she was able to get up and walk around. She when sat down on the couch. A short time after that when she tried to get up she was unable to do so. Patient denies any chest pain or abdominal pain. She denies any numbness or weakness. She has not had any nausea or vomiting. Pain is severe and increases with movement. Dr. Charlann Boxer was consulted.    History reviewed. No pertinent past medical history.  Past Surgical History  Procedure Laterality Date  . Abdominal hysterectomy      No family history on file.  Social History:  reports that she has quit smoking. She does not have any smokeless tobacco history on file. She reports that she does not drink alcohol or use illicit drugs.  Allergies: No Known Allergies   Results for orders placed during the hospital encounter of 08/24/12 (from the past 48 hour(s))  BASIC METABOLIC PANEL     Status: Abnormal   Collection Time    08/24/12  7:30 PM      Result Value Range   Sodium 137  135 - 145 mEq/L   Potassium 3.9  3.5 - 5.1 mEq/L   Chloride 106  96 - 112 mEq/L   CO2 21  19 - 32 mEq/L   Glucose, Bld 99  70 - 99 mg/dL   BUN 13  6 - 23 mg/dL   Creatinine, Ser 1.61 (*) 0.50 - 1.10 mg/dL   Calcium 9.3  8.4 - 09.6 mg/dL   GFR calc non Af Amer 42 (*) >90 mL/min   GFR calc Af Amer 49 (*) >90 mL/min   Comment:            The eGFR has been calculated     using the CKD EPI equation.     This calculation has not been     validated in all clinical     situations.     eGFR's persistently     <90 mL/min signify     possible Chronic Kidney Disease.   CBC WITH DIFFERENTIAL     Status: Abnormal   Collection Time    08/24/12  7:30 PM      Result Value Range   WBC 10.0  4.0 - 10.5 K/uL   RBC 4.01  3.87 - 5.11 MIL/uL   Hemoglobin 12.8  12.0 - 15.0 g/dL   HCT 04.5  40.9 - 81.1 %   MCV 93.5  78.0 - 100.0 fL   MCH 31.9  26.0 - 34.0 pg   MCHC 34.1  30.0 - 36.0 g/dL   RDW 91.4  78.2 - 95.6 %   Platelets 122 (*) 150 - 400 K/uL   Neutrophils Relative 85 (*) 43 - 77 %   Lymphocytes Relative 8 (*) 12 - 46 %   Monocytes Relative 6  3 - 12 %   Eosinophils Relative 1  0 - 5 %   Basophils Relative 0  0 - 1 %   Neutro Abs 8.5 (*) 1.7 - 7.7 K/uL   Lymphs Abs 0.8  0.7 -  4.0 K/uL   Monocytes Absolute 0.6  0.1 - 1.0 K/uL   Eosinophils Absolute 0.1  0.0 - 0.7 K/uL   Basophils Absolute 0.0  0.0 - 0.1 K/uL   Smear Review MORPHOLOGY UNREMARKABLE    PROTIME-INR     Status: None   Collection Time    08/24/12  7:30 PM      Result Value Range   Prothrombin Time 13.6  11.6 - 15.2 seconds   INR 1.05  0.00 - 1.49  TYPE AND SCREEN     Status: None   Collection Time    08/24/12  7:30 PM      Result Value Range   ABO/RH(D) A POS     Antibody Screen NEG     Sample Expiration 08/27/2012    ABO/RH     Status: None   Collection Time    08/24/12  7:30 PM      Result Value Range   ABO/RH(D) A POS    BASIC METABOLIC PANEL     Status: Abnormal   Collection Time    08/25/12  4:40 AM      Result Value Range   Sodium 138  135 - 145 mEq/L   Potassium 3.6  3.5 - 5.1 mEq/L   Chloride 107  96 - 112 mEq/L   CO2 24  19 - 32 mEq/L   Glucose, Bld 102 (*) 70 - 99 mg/dL   BUN 17  6 - 23 mg/dL   Creatinine, Ser 1.61 (*) 0.50 - 1.10 mg/dL   Calcium 8.8  8.4 - 09.6 mg/dL   GFR calc non Af Amer 39 (*) >90 mL/min   GFR calc Af Amer 45 (*) >90 mL/min   Comment:            The eGFR has been calculated     using the CKD EPI equation.     This calculation has not been     validated in all clinical     situations.     eGFR's persistently     <90 mL/min signify      possible Chronic Kidney Disease.  CBC     Status: Abnormal   Collection Time    08/25/12  4:40 AM      Result Value Range   WBC 7.3  4.0 - 10.5 K/uL   RBC 3.62 (*) 3.87 - 5.11 MIL/uL   Hemoglobin 11.8 (*) 12.0 - 15.0 g/dL   HCT 04.5 (*) 40.9 - 81.1 %   MCV 94.2  78.0 - 100.0 fL   MCH 32.6  26.0 - 34.0 pg   MCHC 34.6  30.0 - 36.0 g/dL   RDW 91.4  78.2 - 95.6 %   Platelets 113 (*) 150 - 400 K/uL   Comment: PLATELET COUNT CONFIRMED BY SMEAR    Dg Chest 1 View  08/24/2012  *RADIOLOGY REPORT*  Clinical Data: Fall.  CHEST - 1 VIEW  Comparison: None.  Findings: Remote trauma of posterolateral lower left ribs.  Minimal S-shaped thoracic spine curvature. Midline trachea.  Normal heart size with a tortuous descending thoracic aorta. No pleural effusion or pneumothorax.  Clear lungs.  IMPRESSION: No acute cardiopulmonary disease.   Original Report Authenticated By: Jeronimo Greaves, M.D.    Dg Thoracic Spine 2 View  08/24/2012  *RADIOLOGY REPORT*  Clinical Data: Trauma and pain.  THORACIC SPINE - 2 VIEW  Comparison: None.  Findings: Minimal S-shaped thoracic spine curvature.  Remote left rib trauma.  The lateral view images  from approximately the top of T3 through bottom of L2.  Borderline to minimal loss of thoracic vertebral height at multiple levels, including approximately T6, T7, T8, and T10.  These are of indeterminate acuity.  No canal compromise.  Attempted swimmer's view is nondiagnostic.  IMPRESSION: Incomplete evaluation of the upper thoracic spine.  Borderline/minimal loss of thoracic vertebral body height at multiple levels.  This is of indeterminate acuity.  Correlate with point tenderness.   Original Report Authenticated By: Jeronimo Greaves, M.D.    Dg Lumbar Spine Complete  08/24/2012  *RADIOLOGY REPORT*  Clinical Data: Back  pain secondary to a fall.  LUMBAR SPINE - COMPLETE 4+ VIEW  Comparison: None.  Findings: There is no fracture, subluxation, disc space narrowing, facet arthritis, or other  abnormality of the lumbar spine.   However, the patient does have fractures of the left inferior and superior pubic rami. I suspect the patient has a fracture of the left sacral ala.  Calcification in the abdominal aorta. There is a slight lumbar curvature which could be positional.  IMPRESSION: Acute fractures of the left inferior and superior pubic rami.  I suspect the patient has a fracture of the left side of the sacrum.  No significant abnormality of the lumbar spine.   Original Report Authenticated By: Francene Boyers, M.D.    Dg Hip Complete Left  08/24/2012  *RADIOLOGY REPORT*  Clinical Data: Trauma and pain.  LEFT HIP - COMPLETE 2+ VIEW  Comparison: Femur films same date  Findings: AP view pelvis and AP/frog-leg views of left hip. Femoral heads are located.  Extensive soft tissue calcifications.  No proximal femoral fracture.  Lucency through the left inferior pubic ramus,  acute fracture cannot be excluded.  There is also osseous irregularity about the parasymphyseal region bilaterally.  This could be degenerative or post-traumatic.  IMPRESSION: Probable left inferior pubic ramus fracture.  Concurrent parasymphyseal irregularity bilaterally which could be post- traumatic or degenerative.   Original Report Authenticated By: Jeronimo Greaves, M.D.    Dg Femur Left  08/24/2012  *RADIOLOGY REPORT*  Clinical Data: Fall today with pain.  LEFT FEMUR - 2 VIEW  Comparison: Hip films same date  Findings: Soft tissue calcifications about the proximal left femur. No acute fracture or dislocation.  No knee joint effusion.  IMPRESSION: No acute osseous abnormality.   Original Report Authenticated By: Jeronimo Greaves, M.D.    Dg Tibia/fibula Left  08/24/2012  *RADIOLOGY REPORT*  Clinical Data: Trauma and pain.  LEFT TIBIA AND FIBULA - 2 VIEW  Comparison: Femur films same date  Findings: Mild degenerative irregularity of the medial compartment of the knee.  No fracture about the tibia or fibula.  IMPRESSION: No acute  osseous abnormality.   Original Report Authenticated By: Jeronimo Greaves, M.D.    Ct Head Wo Contrast  08/24/2012  *RADIOLOGY REPORT*  Clinical Data: 67 year old female with fall and injury and pain.  CT HEAD WITHOUT CONTRAST  Technique:  Contiguous axial images were obtained from the base of the skull through the vertex without contrast.  Comparison: None  Findings: Bilateral basal ganglia infarcts are identified - age indeterminate but appear remote. Mild chronic small vessel white matter ischemic changes are identified. No acute intracranial abnormalities are identified, including mass lesion or mass effect, hydrocephalus, extra-axial fluid collection, midline shift, hemorrhage, or acute infarction.  The visualized bony calvarium is unremarkable.  IMPRESSION: No evidence of acute intracranial abnormality.  Remote appearing bilateral basal ganglia lacunar infarcts and mild chronic small vessel white matter  ischemic changes.   Original Report Authenticated By: Harmon Pier, M.D.    Ct Pelvis Wo Contrast  08/24/2012  *RADIOLOGY REPORT*  Clinical Data: Fall, left hip pain  CT PELVIS WITHOUT CONTRAST  Technique:  Multidetector CT imaging of the pelvis was performed following the standard protocol without intravenous contrast.  Comparison: 08/24/2012 lumbar spine radiograph and pelvis radiograph  Findings: Intraperitoneal contents show nothing acute.  Absent uterus.  No adnexal mass.  Bilateral subcutaneous calcifications are nonspecific.  There is a left sacral fracture adjacent to the SI joint, with a nondominant component extending into the SI joint as seen on series 3 image 65.  No diastases.  Fracture of the anterior column left acetabulum with mild displacement.  The femoral head remains seated within the acetabulum.  No femoral fracture visualized.  Comminuted left inferior pubic ramus fracture. Nondisplaced parasymphyseal superior pubic ramus fracture on the left as seen on coronal image 41.  IMPRESSION: Left  sacral ala fracture.  Left acetabular anterior column fracture.  Left superior and inferior pubic rami fractures.  Nonspecific subcutaneous calcifications.   Original Report Authenticated By: Jearld Lesch, M.D.     Review of Systems  Constitutional: Negative.   HENT: Negative.   Eyes: Negative.   Respiratory: Negative.   Cardiovascular: Negative.   Gastrointestinal: Negative.   Genitourinary: Negative.   Musculoskeletal: Positive for back pain, joint pain and falls.  Skin: Negative.   Endo/Heme/Allergies: Negative.   Psychiatric/Behavioral: Positive for memory loss (confusion).   Blood pressure 104/67, pulse 78, temperature 99.3 F (37.4 C), temperature source Oral, resp. rate 20, height 5\' 2"  (1.575 m), weight 50.395 kg (111 lb 1.6 oz), SpO2 90.00%. Physical Exam  Constitutional: She appears well-developed and well-nourished.  HENT:  Head: Normocephalic and atraumatic.  Mouth/Throat: Oropharynx is clear and moist.  Eyes: Pupils are equal, round, and reactive to light.  Neck: Neck supple. No JVD present. No tracheal deviation present. No thyromegaly present.  Cardiovascular: Normal rate, regular rhythm and intact distal pulses.   Respiratory: Breath sounds normal. No respiratory distress. She has no wheezes.  GI: Soft. There is no tenderness. There is no guarding.  Musculoskeletal:       Left hip: She exhibits decreased range of motion, decreased strength, tenderness and bony tenderness.       Lumbar back: She exhibits tenderness (sacral / pelvic pain) and bony tenderness.  Lymphadenopathy:    She has no cervical adenopathy.  Skin: Skin is warm and dry.  Psychiatric: She has a normal mood and affect.    Assessment/Plan: Left pubic rami fracture / sacral fracture   Plan: Ct to further evaluate the left pubic rami and sacral fractures At this time no surgical intervention is necessary NWB on the left lower extremity, otherwise WBAT     Gerrit Halls 08/25/2012, 12:05 PM   After speaking with the patient and reviewing studies I would like to ask Dr. Myrene Galas his expert opinion regarding the nature of and specifics of the acetabular fracture.    She appears to be frail so operative fixation my be a challenge but I would need to defer that decision to him.  If fracture does not need operative fixation I would be glad to continue out patient follow up with the patient until fractures heal

## 2012-08-25 NOTE — Progress Notes (Signed)
OT Cancellation Note  Patient Details Name: Patricia Stanley MRN: [id number] DOB: [birth year]   Cancelled Treatment:     Awaiting ortho input for weightbearing status.  Evette Georges [phone] 08/25/2012, 1:09 PM

## 2012-08-25 NOTE — Progress Notes (Signed)
PT Cancellation Note  Patient Details Name: Patricia Stanley MRN: [id number] DOB: 10-Nov-1945   Cancelled Treatment:    Reason Eval/Treat Not Completed: Other (comment) (order received Await ortho consult for weight bearing status) Thank you!   Ebony Hail [facility] 08/25/2012, 8:39 AM

## 2012-08-25 NOTE — Progress Notes (Signed)
Brief Nutrition Notes:  Pt identified on malnutrition screening tool report for unintetional weight loss and poor oral intake.  Spoke with pt who states appetite is normal and denies any weight loss.  C/o some pain in mouth/tongue. Was supposed to see a dentist about it yesterday but miss appointment. Encouraged pt to notify MD about this.   Wt Readings from Last 5 Encounters:  08/25/12 111 lb 1.6 oz (50.395 kg)   Body mass index is 20.32 kg/(m^2). WNL Diet: Regular  Chart reviewed, no nutrition interventions warranted at this time. Please consult as needed.   Clarene Duke RD, LDN Pager [phone] After Hours pager [phone]

## 2012-08-25 NOTE — Care Management Note (Signed)
    Page 1 of 1   08/27/2012     3:29:03 PM   CARE MANAGEMENT NOTE 08/27/2012  Patient:  Patricia Stanley, Patricia Stanley   Account Number:  [id number]  Date Initiated:  08/25/2012  Documentation initiated by:  Letha Cape  Subjective/Objective Assessment:   dx pelvic fx  admit - lives with spouse.     Action/Plan:   Anticipated DC Date:  08/28/2012   Anticipated DC Plan:  SKILLED NURSING FACILITY  In-house referral  Clinical Social Worker      DC Planning Services  CM consult      Choice offered to / List presented to:             Status of service:  Completed, signed off Medicare Important Message given?   (If response is "NO", the following Medicare IM given date fields will be blank) Date Medicare IM given:   Date Additional Medicare IM given:    Discharge Disposition:  SKILLED NURSING FACILITY  Per UR Regulation:  Reviewed for med. necessity/level of care/duration of stay  If discussed at Long Length of Stay Meetings, dates discussed:    Comments:  08/27/12 10:34 Letha Cape RN, BSN [phone] patient is for possible dc to snf today, CSW awaiting bed offers to present to patient, bed offers presented , awaiting dc orders and ortho recs for weight bearing status.  Per CSW , pt will go to snf in am to [facility].  08/26/12 15:40 Letha Cape RN, BSN [phone] per MD patient will be ready for snf tomorrow, awaiting ortho recs.  Informed CSW.  Per MD, he thinks family would really like [facility], informed him that I would let the CSW know.  08/25/12 17:17 Letha Cape RN, BSN [phone] patient lives with spouse, pt with pelvic fx, pt will need snf at dc, CSW referral.

## 2012-08-26 NOTE — Progress Notes (Signed)
OT Cancellation Note  Patient Details Name: Patricia Stanley MRN: [id number] DOB: [birth year]   Cancelled Treatment:    Reason Eval/Treat Not Completed:  (pt needed pain meds before attempting to move.)-will attempt to see pt tomorrow.  Evette Georges [phone] 08/26/2012, 4:13 PM

## 2012-08-26 NOTE — Progress Notes (Signed)
PT Cancellation Note  Patient Details Name: TOULA MIYASAKI MRN: [id number] DOB: Oct 29, 1945   Cancelled Treatment:    Reason Eval/Treat Not Completed: Other (comment) (awaiting weight bearing guidance from Dr. Carola Frost)   Donnetta Hail 08/26/2012, 8:24 AM

## 2012-08-26 NOTE — Clinical Social Work Psychosocial (Signed)
     Clinical Social Work Department BRIEF PSYCHOSOCIAL ASSESSMENT 08/26/2012  Patient:  Patricia Stanley, Patricia Stanley     Account Number:  [id number]     Admit date:  08/24/2012  Clinical Social Worker:  Hulan Fray  Date/Time:  08/26/2012 02:21 PM  Referred by:  Physician  Date Referred:  08/25/2012 Referred for  SNF Placement   Other Referral:   Interview type:  Patient Other interview type:   Felton Clinton [phone])  (Sister)    PSYCHOSOCIAL DATA Living Status:  HUSBAND Admitted from facility:   Level of care:   Primary support name:  Alphia Behanna Primary support relationship to patient:  SPOUSE Degree of support available:   supportive    CURRENT CONCERNS Current Concerns  Post-Acute Placement   Other Concerns:    SOCIAL WORK ASSESSMENT / PLAN Clinical Social Worker received referral for SNF placement for patient. CSW introduced self and explained reason for visit. Patient's daughter and two sisters were at bedside. CSW provided SNF packet and explained SNF process. Family were agreeable for CSW to initiate SNF search in [place] excluding [place] [facility] and [facility] [employer]. Family was interest in CSW pursuing SNF search to [facility].    CSW will complete FL2 for MD's signature and will update patient and family when bed offers are made.   Assessment/plan status:  Psychosocial Support/Ongoing Assessment of Needs Other assessment/ plan:   Information/referral to community resources:   SNF packet    PATIENTS/FAMILYS RESPONSE TO PLAN OF CARE: Patient and family appeared agreeable for CSW to initiate SNF search in [place]. and [employer]. Patient and family were appreciative for CSW"s visit and assistance.

## 2012-08-26 NOTE — Progress Notes (Signed)
Subjective: Patient is arousable and conversive today.  She is still having pain of course but she is not oversedated at the moment.  Yesterday she was very groggy for much of the day and her narcotics were reduced.  She also was put back on benzodiazepines which she has taken chronically for many years.  She has a history of severe depression.  Sometimes she does have some confusion at home but I think that is more secondary to sedative medications sometimes overused somewhat inadvertently.  Objective: Weight change:   Intake/Output Summary (Last 24 hours) at 08/26/12 0734 Last data filed at 08/25/12 1902  Gross per 24 hour  Intake    240 ml  Output      0 ml  Net    240 ml   Filed Vitals:   08/25/12 1500 08/25/12 2030 08/25/12 2221 08/26/12 0515  BP: 135/56 108/63 116/45 118/70  Pulse: 93 82  80  Temp: 99.8 F (37.7 C) 98.7 F (37.1 C)  97.8 F (36.6 C)  TempSrc: Oral Oral  Oral  Resp: 18 20  18   Height:      Weight:      SpO2: 94% 93%  90%    General Appearance: Alert, cooperative, no distress, appears stated age Lungs: Clear to auscultation bilaterally, respirations unlabored Heart: Regular rate and rhythm, S1 and S2 normal, no murmur, rub or gallop Abdomen: Soft, non-tender, bowel sounds active all four quadrants, no masses, no organomegaly Extremities: Extremities normal, atraumatic, no cyanosis or edema painful to flex left thigh Neuro: Arousable and nonfocal  Lab Results: Results for orders placed during the hospital encounter of 08/24/12 (from the past 48 hour(s))  BASIC METABOLIC PANEL     Status: Abnormal   Collection Time    08/24/12  7:30 PM      Result Value Range   Sodium 137  135 - 145 mEq/L   Potassium 3.9  3.5 - 5.1 mEq/L   Chloride 106  96 - 112 mEq/L   CO2 21  19 - 32 mEq/L   Glucose, Bld 99  70 - 99 mg/dL   BUN 13  6 - 23 mg/dL   Creatinine, Ser 7.82 (*) 0.50 - 1.10 mg/dL   Calcium 9.3  8.4 - 95.6 mg/dL   GFR calc non Af Amer 42 (*) >90 mL/min    GFR calc Af Amer 49 (*) >90 mL/min   Comment:            The eGFR has been calculated     using the CKD EPI equation.     This calculation has not been     validated in all clinical     situations.     eGFR's persistently     <90 mL/min signify     possible Chronic Kidney Disease.  CBC WITH DIFFERENTIAL     Status: Abnormal   Collection Time    08/24/12  7:30 PM      Result Value Range   WBC 10.0  4.0 - 10.5 K/uL   RBC 4.01  3.87 - 5.11 MIL/uL   Hemoglobin 12.8  12.0 - 15.0 g/dL   HCT 21.3  08.6 - 57.8 %   MCV 93.5  78.0 - 100.0 fL   MCH 31.9  26.0 - 34.0 pg   MCHC 34.1  30.0 - 36.0 g/dL   RDW 46.9  62.9 - 52.8 %   Platelets 122 (*) 150 - 400 K/uL   Neutrophils Relative 85 (*) 43 -  77 %   Lymphocytes Relative 8 (*) 12 - 46 %   Monocytes Relative 6  3 - 12 %   Eosinophils Relative 1  0 - 5 %   Basophils Relative 0  0 - 1 %   Neutro Abs 8.5 (*) 1.7 - 7.7 K/uL   Lymphs Abs 0.8  0.7 - 4.0 K/uL   Monocytes Absolute 0.6  0.1 - 1.0 K/uL   Eosinophils Absolute 0.1  0.0 - 0.7 K/uL   Basophils Absolute 0.0  0.0 - 0.1 K/uL   Smear Review MORPHOLOGY UNREMARKABLE    PROTIME-INR     Status: None   Collection Time    08/24/12  7:30 PM      Result Value Range   Prothrombin Time 13.6  11.6 - 15.2 seconds   INR 1.05  0.00 - 1.49  TYPE AND SCREEN     Status: None   Collection Time    08/24/12  7:30 PM      Result Value Range   ABO/RH(D) A POS     Antibody Screen NEG     Sample Expiration 08/27/2012    ABO/RH     Status: None   Collection Time    08/24/12  7:30 PM      Result Value Range   ABO/RH(D) A POS    BASIC METABOLIC PANEL     Status: Abnormal   Collection Time    08/25/12  4:40 AM      Result Value Range   Sodium 138  135 - 145 mEq/L   Potassium 3.6  3.5 - 5.1 mEq/L   Chloride 107  96 - 112 mEq/L   CO2 24  19 - 32 mEq/L   Glucose, Bld 102 (*) 70 - 99 mg/dL   BUN 17  6 - 23 mg/dL   Creatinine, Ser 1.30 (*) 0.50 - 1.10 mg/dL   Calcium 8.8  8.4 - 86.5 mg/dL   GFR calc  non Af Amer 39 (*) >90 mL/min   GFR calc Af Amer 45 (*) >90 mL/min   Comment:            The eGFR has been calculated     using the CKD EPI equation.     This calculation has not been     validated in all clinical     situations.     eGFR's persistently     <90 mL/min signify     possible Chronic Kidney Disease.  CBC     Status: Abnormal   Collection Time    08/25/12  4:40 AM      Result Value Range   WBC 7.3  4.0 - 10.5 K/uL   RBC 3.62 (*) 3.87 - 5.11 MIL/uL   Hemoglobin 11.8 (*) 12.0 - 15.0 g/dL   HCT 78.4 (*) 69.6 - 29.5 %   MCV 94.2  78.0 - 100.0 fL   MCH 32.6  26.0 - 34.0 pg   MCHC 34.6  30.0 - 36.0 g/dL   RDW 28.4  13.2 - 44.0 %   Platelets 113 (*) 150 - 400 K/uL   Comment: PLATELET COUNT CONFIRMED BY SMEAR    Studies/Results: Dg Chest 1 View  08/24/2012  *RADIOLOGY REPORT*  Clinical Data: Fall.  CHEST - 1 VIEW  Comparison: None.  Findings: Remote trauma of posterolateral lower left ribs.  Minimal S-shaped thoracic spine curvature. Midline trachea.  Normal heart size with a tortuous descending thoracic aorta. No pleural effusion or pneumothorax.  Clear  lungs.  IMPRESSION: No acute cardiopulmonary disease.   Original Report Authenticated By: Jeronimo Greaves, M.D.    Dg Thoracic Spine 2 View  08/24/2012  *RADIOLOGY REPORT*  Clinical Data: Trauma and pain.  THORACIC SPINE - 2 VIEW  Comparison: None.  Findings: Minimal S-shaped thoracic spine curvature.  Remote left rib trauma.  The lateral view images from approximately the top of T3 through bottom of L2.  Borderline to minimal loss of thoracic vertebral height at multiple levels, including approximately T6, T7, T8, and T10.  These are of indeterminate acuity.  No canal compromise.  Attempted swimmer's view is nondiagnostic.  IMPRESSION: Incomplete evaluation of the upper thoracic spine.  Borderline/minimal loss of thoracic vertebral body height at multiple levels.  This is of indeterminate acuity.  Correlate with point tenderness.    Original Report Authenticated By: Jeronimo Greaves, M.D.    Dg Lumbar Spine Complete  08/24/2012  *RADIOLOGY REPORT*  Clinical Data: Back  pain secondary to a fall.  LUMBAR SPINE - COMPLETE 4+ VIEW  Comparison: None.  Findings: There is no fracture, subluxation, disc space narrowing, facet arthritis, or other abnormality of the lumbar spine.   However, the patient does have fractures of the left inferior and superior pubic rami. I suspect the patient has a fracture of the left sacral ala.  Calcification in the abdominal aorta. There is a slight lumbar curvature which could be positional.  IMPRESSION: Acute fractures of the left inferior and superior pubic rami.  I suspect the patient has a fracture of the left side of the sacrum.  No significant abnormality of the lumbar spine.   Original Report Authenticated By: Francene Boyers, M.D.    Dg Hip Complete Left  08/24/2012  *RADIOLOGY REPORT*  Clinical Data: Trauma and pain.  LEFT HIP - COMPLETE 2+ VIEW  Comparison: Femur films same date  Findings: AP view pelvis and AP/frog-leg views of left hip. Femoral heads are located.  Extensive soft tissue calcifications.  No proximal femoral fracture.  Lucency through the left inferior pubic ramus,  acute fracture cannot be excluded.  There is also osseous irregularity about the parasymphyseal region bilaterally.  This could be degenerative or post-traumatic.  IMPRESSION: Probable left inferior pubic ramus fracture.  Concurrent parasymphyseal irregularity bilaterally which could be post- traumatic or degenerative.   Original Report Authenticated By: Jeronimo Greaves, M.D.    Dg Femur Left  08/24/2012  *RADIOLOGY REPORT*  Clinical Data: Fall today with pain.  LEFT FEMUR - 2 VIEW  Comparison: Hip films same date  Findings: Soft tissue calcifications about the proximal left femur. No acute fracture or dislocation.  No knee joint effusion.  IMPRESSION: No acute osseous abnormality.   Original Report Authenticated By: Jeronimo Greaves, M.D.     Dg Tibia/fibula Left  08/24/2012  *RADIOLOGY REPORT*  Clinical Data: Trauma and pain.  LEFT TIBIA AND FIBULA - 2 VIEW  Comparison: Femur films same date  Findings: Mild degenerative irregularity of the medial compartment of the knee.  No fracture about the tibia or fibula.  IMPRESSION: No acute osseous abnormality.   Original Report Authenticated By: Jeronimo Greaves, M.D.    Ct Head Wo Contrast  08/24/2012  *RADIOLOGY REPORT*  Clinical Data: 67 year old female with fall and injury and pain.  CT HEAD WITHOUT CONTRAST  Technique:  Contiguous axial images were obtained from the base of the skull through the vertex without contrast.  Comparison: None  Findings: Bilateral basal ganglia infarcts are identified - age indeterminate but appear remote. Mild  chronic small vessel white matter ischemic changes are identified. No acute intracranial abnormalities are identified, including mass lesion or mass effect, hydrocephalus, extra-axial fluid collection, midline shift, hemorrhage, or acute infarction.  The visualized bony calvarium is unremarkable.  IMPRESSION: No evidence of acute intracranial abnormality.  Remote appearing bilateral basal ganglia lacunar infarcts and mild chronic small vessel white matter ischemic changes.   Original Report Authenticated By: Harmon Pier, M.D.    Ct Pelvis Wo Contrast  08/24/2012  *RADIOLOGY REPORT*  Clinical Data: Fall, left hip pain  CT PELVIS WITHOUT CONTRAST  Technique:  Multidetector CT imaging of the pelvis was performed following the standard protocol without intravenous contrast.  Comparison: 08/24/2012 lumbar spine radiograph and pelvis radiograph  Findings: Intraperitoneal contents show nothing acute.  Absent uterus.  No adnexal mass.  Bilateral subcutaneous calcifications are nonspecific.  There is a left sacral fracture adjacent to the SI joint, with a nondominant component extending into the SI joint as seen on series 3 image 65.  No diastases.  Fracture of the anterior  column left acetabulum with mild displacement.  The femoral head remains seated within the acetabulum.  No femoral fracture visualized.  Comminuted left inferior pubic ramus fracture. Nondisplaced parasymphyseal superior pubic ramus fracture on the left as seen on coronal image 41.  IMPRESSION: Left sacral ala fracture.  Left acetabular anterior column fracture.  Left superior and inferior pubic rami fractures.  Nonspecific subcutaneous calcifications.   Original Report Authenticated By: Jearld Lesch, M.D.    Medications: Scheduled Meds: . ARIPiprazole  2.5 mg Oral Daily  . atenolol  50 mg Oral Daily  . calcitonin (salmon)  1 spray Alternating Nares Daily  . clonazePAM  1 mg Oral BID  . cloNIDine  0.1 mg Oral BID  . enoxaparin (LOVENOX) injection  40 mg Subcutaneous Q24H  . FLUoxetine  20 mg Oral Daily  . polyethylene glycol  17 g Oral BID  . senna-docusate  1 tablet Oral BID  . topiramate  150 mg Oral QHS   Continuous Infusions:  PRN Meds:.HYDROcodone-acetaminophen, HYDROmorphone (DILAUDID) injection, LORazepam, ondansetron (ZOFRAN) IV, ondansetron, temazepam  Assessment/Plan:  Principal Problem:  Pelvic fracture - appreciate orthopedic consultation and will find out today whether or not left acetabulum need surgery.  I do think that she is a reasonable surgical candidate but will need to keep up with medications for anxiety and depression..  She has 4 fractures noted on x-ray - an acetabular fracture, inferior and superior pubic rami fractures, and a sacral fracture on the left.  Will likely require a long rehabilitation.  A skilled nursing facility would be her best choice for now for 2-6 weeks of therapy. Other medical issues are stable currently. Will continue Fortical nasal spray   Active Problems:  Fall at home  Migraines  Renal insufficiency, mild  Major depression in partial remission Chronic anxiety - benzodiazepines resumed  Osteoporosis, unspecified - calcitonin nasal  spray started Essential hypertension, benign - controlled  Disposition - plans are for skilled nursing facility for rehabilitation but may need surgery in the interim on the left acetabulum    LOS: 2 days   Patricia Dubonnet, MD 08/26/2012, 7:34 AM

## 2012-08-26 NOTE — Clinical Social Work Placement (Addendum)
    Clinical Social Work Department CLINICAL SOCIAL WORK PLACEMENT NOTE 08/26/2012  Patient:  Patricia Stanley, Patricia Stanley  Account Number:  [id number] Admit date:  08/24/2012  Clinical Social Worker:  Hulan Fray  Date/time:  08/26/2012 02:57 PM  Clinical Social Work is seeking post-discharge placement for this patient at the following level of care:   SKILLED NURSING   (*CSW will update this form in Epic as items are completed)   08/26/2012  Patient/family provided with [facility] Department of Clinical Social Work's list of facilities offering this level of care within the geographic area requested by the patient (or if unable, by the patient's family).  08/26/2012  Patient/family informed of their freedom to choose among providers that offer the needed level of care, that participate in Medicare, Medicaid or managed care program needed by the patient, have an available bed and are willing to accept the patient.  08/26/2012  Patient/family informed of [facility]' ownership interest in [facility], as well as of the fact that they are under no obligation to receive care at this facility.  PASARR submitted to EDS on 08/26/2012 PASARR number received from EDS on 08-26-12  FL2 transmitted to all facilities in geographic area requested by pt/family on  08/26/2012 FL2 transmitted to all facilities within larger geographic area on   Patient informed that his/her managed care company has contracts with or will negotiate with  certain facilities, including the following:     Patient/family informed of bed offers received:  08-27-12 Patient chooses bed at [facility] Physician recommends and patient chooses bed at    Patient to be transferred to [facility] on 08-28-12   Patient to be transferred to facility by [facility]  The following physician request were entered in Epic:   Additional Comments:

## 2012-08-27 MED ORDER — HYDROCODONE-ACETAMINOPHEN 5-325 MG PO TABS: 1.0000 | ORAL_TABLET | ORAL | Status: DC | PRN

## 2012-08-27 NOTE — Clinical Social Work Note (Addendum)
Clinical Social Worker provided bed offers to patient and left list in patient's room. Patient was agreeable for CSW to contact her husband. CSW called number listed in chart and the phone kept ringing. CSW will continue to follow and follow up with patient on which facility is confirmed.   14:07pm CSW spoke with patient and she provided husband's cell phone [phone]). CSW informed husband of bed offers received.   15:19pm CSW spoke with husband and he confirmed [facility] SN[facility]. Facility can take patient tomorrow,and will need discharge summary completed. CSW notified MD.   Patricia Stanley MSW, Theresia Majors [phone]

## 2012-08-27 NOTE — Progress Notes (Signed)
   CARE MANAGEMENT NOTE 08/27/2012  Patient:  Patricia Stanley, Patricia Stanley   Account Number:  [id number]  Date Initiated:  08/25/2012  Documentation initiated by:  Letha Cape  Subjective/Objective Assessment:   dx pelvic fx  admit - lives with spouse.     Action/Plan:   Anticipated DC Date:  08/27/2012   Anticipated DC Plan:  SKILLED NURSING FACILITY  In-house referral  Clinical Social Worker      DC Planning Services  CM consult      Choice offered to / List presented to:             Status of service:  Completed, signed off Medicare Important Message given?   (If response is "NO", the following Medicare IM given date fields will be blank) Date Medicare IM given:   Date Additional Medicare IM given:    Discharge Disposition:  SKILLED NURSING FACILITY  Per UR Regulation:  Reviewed for med. necessity/level of care/duration of stay  If discussed at Long Length of Stay Meetings, dates discussed:    Comments:  08/27/12 10:34 Letha Cape RN, BSN [phone] patient is for possible dc to snf today, CSW awaiting bed offers to present to patient.  08/26/12 15:40 Letha Cape RN, BSN [phone] per MD patient will be ready for snf tomorrow, awaiting ortho recs.  Informed CSW.  Per MD, he thinks family would really like [facility], informed him that I would let the CSW know.  08/25/12 17:17 Letha Cape RN, BSN [phone] patient lives with spouse, pt with pelvic fx, pt will need snf at dc, CSW referral.

## 2012-08-27 NOTE — Progress Notes (Signed)
Subjective: Patricia Stanley did not sleep well last night. Has not been up with physical therapy. Awaiting recommendations from orthopedics about level of activity recommended with pelvic fracture on the left with acetabular involvement. Agrees to [facility] skilled nursing facility if surgery is not needed  Objective: Weight change:   Intake/Output Summary (Last 24 hours) at 08/27/12 1736 Last data filed at 08/27/12 0920  Gross per 24 hour  Intake    300 ml  Output      0 ml  Net    300 ml   Filed Vitals:   08/26/12 1422 08/26/12 2132 08/27/12 0544 08/27/12 1015  BP: 99/48 118/68 137/79 136/71  Pulse: 74 77 79 78  Temp: 98.2 F (36.8 C) 98.6 F (37 C) 98.2 F (36.8 C) 98.4 F (36.9 C)  TempSrc: Oral Oral Oral Oral  Resp: 18 18 20 18   Height:      Weight:      SpO2: 95% 95% 93% 96%    General Appearance: Alert, cooperative, no distress, appears stated age Lungs: Clear to auscultation bilaterally, respirations unlabored Heart: Regular rate and rhythm, S1 and S2 normal, no murmur, rub or gallop Abdomen: Soft, non-tender, bowel sounds active all four quadrants, no masses, no organomegaly Extremities: Extremities normal, atraumatic, no cyanosis or edema Neuro: Oriented x3, nonfocal, some confusion at times on narcotics and benzodiazepines  Lab Results: No results found for this or any previous visit (from the past 48 hour(s)).  Studies/Results: No results found. Medications: Scheduled Meds: . ARIPiprazole  2.5 mg Oral Daily  . atenolol  50 mg Oral Daily  . calcitonin (salmon)  1 spray Alternating Nares Daily  . clonazePAM  1 mg Oral BID  . cloNIDine  0.1 mg Oral BID  . enoxaparin (LOVENOX) injection  40 mg Subcutaneous Q24H  . FLUoxetine  20 mg Oral Daily  . polyethylene glycol  17 g Oral BID  . senna-docusate  1 tablet Oral BID  . topiramate  150 mg Oral QHS   Continuous Infusions:  PRN Meds:.HYDROcodone-acetaminophen, HYDROmorphone (DILAUDID) injection, LORazepam, ondansetron  (ZOFRAN) IV, ondansetron, temazepam  Assessment/Plan: Principal Problem:   Pelvic fracture - appreciate orthopedic advice on rehabilitation. We'll plan for discharge in a.m. to [facility] skilled nursing facility  Active Problems:   Fall at home   Insomnia   Migraines   Renal insufficiency, mild   Major depression in partial remission   Osteoporosis, unspecified   Essential hypertension, benign    LOS: 3 days   Pearla Dubonnet, MD 08/27/2012, 5:36 PM

## 2012-08-27 NOTE — Evaluation (Signed)
Occupational Therapy Evaluation Patient Details Name: Patricia Stanley MRN: [id number] DOB: July 31, 1945 Today's Date: 08/27/2012 Time: 4098-1191 OT Time Calculation (min): 27 min  OT Assessment / Plan / Recommendation Clinical Impression  This 67 yo female s/p fall with resultant fractures of left sacral ala, left ant column of acetabulum, left sup and inf pubic rami presents to acute OT with problems below. Will benefit from acute OT with follow up at SNF.    OT Assessment  Patient needs continued OT Services    Follow Up Recommendations  SNF    Barriers to Discharge None    Equipment Recommendations  None recommended by OT       Frequency  Min 2X/week    Precautions / Restrictions Precautions Precautions: Fall Restrictions Weight Bearing Restrictions: Yes LLE Weight Bearing: Non weight bearing   Pertinent Vitals/Pain LLE pain, RN made aware and repositioned pt in bed    ADL  Eating/Feeding: Simulated;Independent Where Assessed - Eating/Feeding: Edge of bed Grooming: Simulated;Set up;Supervision/safety Where Assessed - Grooming: Unsupported sitting Upper Body Bathing: Simulated;Set up;Supervision/safety Where Assessed - Upper Body Bathing: Unsupported sitting Lower Body Bathing: Simulated;+1 Total assistance Where Assessed - Lower Body Bathing: Unsupported sit to stand Upper Body Dressing: Simulated;Set up;Supervision/safety Where Assessed - Upper Body Dressing: Unsupported sitting Lower Body Dressing: Simulated;+1 Total assistance Where Assessed - Lower Body Dressing: Supported sit to stand Equipment Used: Gait belt;Rolling walker Transfers/Ambulation Related to ADLs: total A +2 (pt-=50%) sit to stand from raised bed, Mod A stand to sit from lowered bed    OT Diagnosis: Generalized weakness;Acute pain  OT Problem List: Decreased strength;Decreased range of motion;Impaired balance (sitting and/or standing);Pain;Decreased knowledge of precautions;Decreased knowledge of  use of DME or AE OT Treatment Interventions: Self-care/ADL training;Balance training;DME and/or AE instruction;Patient/family education   OT Goals Acute Rehab OT Goals OT Goal Formulation: With patient Time For Goal Achievement: 09/17/12 Potential to Achieve Goals: Good ADL Goals Pt Will Perform Lower Body Dressing: with min assist;Sit to stand from chair;Sit to stand from bed;Supported;with adaptive equipment ADL Goal: Lower Body Dressing - Progress: Goal set today Pt Will Transfer to Toilet: with min assist;Stand pivot transfer;with DME;3-in-1;Maintaining weight bearing status ADL Goal: Toilet Transfer - Progress: Goal set today Pt Will Perform Toileting - Clothing Manipulation: Standing (min guard A) ADL Goal: Toileting - Clothing Manipulation - Progress: Goal set today Pt Will Perform Toileting - Hygiene: Sit to stand from 3-in-1/toilet (min guard A) ADL Goal: Toileting - Hygiene - Progress: Goal set today Miscellaneous OT Goals Miscellaneous OT Goal #1: Pt will be able to come up to sit EOB in prep for BADLs and tranfers with min A with HOB up and use of rail OT Goal: Miscellaneous Goal #1 - Progress: Goal set today  Visit Information  Last OT Received On: 08/27/12 Assistance Needed: +2 PT/OT Co-Evaluation/Treatment: Yes    Subjective Data  Subjective: I really do not want to go to a nursing home   Prior Functioning     Home Living Lives With: Family Available Help at Discharge: Family;Available 24 hours/day Type of Home: House Prior Function Level of Independence: Independent Able to Take Stairs?: Yes Driving: Yes Communication Communication: No difficulties Dominant Hand: Right            Cognition  Cognition Arousal/Alertness: Awake/alert Behavior During Therapy: WFL for tasks assessed/performed Overall Cognitive Status: Within Functional Limits for tasks assessed    Extremity/Trunk Assessment Right Upper Extremity Assessment RUE ROM/Strength/Tone:  Within functional levels Left Upper Extremity Assessment LUE  ROM/Strength/Tone: Within functional levels Right Lower Extremity Assessment RLE ROM/Strength/Tone: Within functional levels RLE Sensation: WFL - Light Touch;WFL - Proprioception Left Lower Extremity Assessment LLE ROM/Strength/Tone: Deficits;Due to pain LLE ROM/Strength/Tone Deficits: pt able to move ankle without difficulty, and can do short arc quad over folded pillow , but has pain LLE Sensation: WFL - Light Touch;WFL - Proprioception Trunk Assessment Trunk Assessment: Normal     Mobility Bed Mobility Bed Mobility: Rolling Right;Supine to Sit;Sit to Supine Rolling Right: 3: Mod assist Supine to Sit: 1: +2 Total assist Supine to Sit: Patient Percentage: 30% Sit to Supine: 1: +2 Total assist Sit to Supine: Patient Percentage: 10% Details for Bed Mobility Assistance: pt was limited in bed mobility and transitional movements  by pain Transfers Transfers: Sit to Stand;Stand to Sit Sit to Stand: 1: +2 Total assist Sit to Stand: Patient Percentage: 50% Stand to Sit: 3: Mod assist Details for Transfer Assistance: pt instructed to push up with arms and right leg to stand  Able to maintain standing x 10 seconds     Exercise General Exercises - Lower Extremity Ankle Circles/Pumps: AROM;Both;10 reps;Supine Quad Sets: AROM;Both;5 reps;Supine Short Arc Quad: AROM;Both;10 reps;Supine Other Exercises Other Exercises: deep breathing. Other Exercises: bilateral recipocol shoulder flexion with core activation   Balance Balance Balance Assessed: Yes Static Sitting Balance Static Sitting - Balance Support: No upper extremity supported Static Sitting - Level of Assistance: 6: Modified independent (Device/Increase time) Static Standing Balance Static Standing - Balance Support: Bilateral upper extremity supported Static Standing - Level of Assistance: 5: Stand by assistance Static Standing - Comment/# of Minutes: 10 seconds    End of Session OT - End of Session Equipment Utilized During Treatment: Gait belt Activity Tolerance: Patient tolerated treatment well Patient left: in bed Nurse Communication:  (Still would like more input from ortho)  GO Functional Assessment Tool Used: Clinical observation Functional Limitation: Self care Self Care Current Status (N8295): At least 80 percent but less than 100 percent impaired, limited or restricted Self Care Goal Status (A2130): At least 20 percent but less than 40 percent impaired, limited or restricted   Evette Georges [phone] 08/27/2012, 2:25 PM

## 2012-08-27 NOTE — Evaluation (Signed)
Physical Therapy Evaluation Patient Details Name: Patricia Stanley MRN: [id number] DOB: 02-Oct-1945 Today's Date: 08/27/2012 Time: 6578-4696 PT Time Calculation (min): 24 min  PT Assessment / Plan / Recommendation Clinical Impression  67 yo female admitted with pelvic fx after a fall from a stoll at home: Left sacral ala, Left ant column of acetabulum, left sup and inf pubic rami.  Pt has significant pain, but is able to perform exercise and beginning mobility. Anticipate she will need 24/7 care and continued PT at SNF prior to return to home.     PT Assessment  Patient needs continued PT services    Follow Up Recommendations  SNF    Does the patient have the potential to tolerate intense rehabilitation      Barriers to Discharge        Equipment Recommendations  Rolling walker with 5" wheels    Recommendations for Other Services     Frequency Min 3X/week    Precautions / Restrictions Restrictions Weight Bearing Restrictions: Yes LLE Weight Bearing: Non weight bearing   Pertinent Vitals/Pain Pt with 10/10 pain in left leg      Mobility  Bed Mobility Bed Mobility: Rolling Right;Supine to Sit;Sit to Supine Rolling Right: 3: Mod assist Supine to Sit: 1: +2 Total assist Supine to Sit: Patient Percentage: 30% Sit to Supine: 1: +2 Total assist Sit to Supine: Patient Percentage: 10% Details for Bed Mobility Assistance: pt was limited in bed mobility and transitional movements  by pain Transfers Transfers: Sit to Stand;Stand to Sit Sit to Stand: 1: +2 Total assist Sit to Stand: Patient Percentage: 50% Stand to Sit: 3: Mod assist Details for Transfer Assistance: pt instructed to push up with arms and right leg to stand  Able to maintain standing x 10 seconds Ambulation/Gait Ambulation/Gait Assistance: Not tested (comment) Stairs: No Wheelchair Mobility Wheelchair Mobility: No    Exercises General Exercises - Lower Extremity Ankle Circles/Pumps: AROM;Both;10  reps;Supine Quad Sets: AROM;Both;5 reps;Supine Short Arc Quad: AROM;Both;10 reps;Supine Other Exercises Other Exercises: deep breathing. Other Exercises: bilateral recipocol shoulder flexion with core activation   PT Diagnosis:    PT Problem List: Decreased mobility;Decreased strength;Pain PT Treatment Interventions: DME instruction;Functional mobility training;Therapeutic activities;Therapeutic exercise;Patient/family education   PT Goals Acute Rehab PT Goals PT Goal Formulation: With patient Time For Goal Achievement: 09/10/12 Pt will Roll Supine to Right Side: with min assist PT Goal: Rolling Supine to Right Side - Progress: Goal set today Pt will Roll Supine to Left Side: with min assist PT Goal: Rolling Supine to Left Side - Progress: Goal set today Pt will go Supine/Side to Sit: with min assist PT Goal: Supine/Side to Sit - Progress: Goal set today Pt will Transfer Bed to Chair/Chair to Bed: with min assist PT Transfer Goal: Bed to Chair/Chair to Bed - Progress: Goal set today Pt will Stand: with modified independence;with bilateral upper extremity support PT Goal: Stand - Progress: Goal set today Pt will Ambulate: 1 - 15 feet;with supervision;with rolling walker PT Goal: Ambulate - Progress: Goal set today  Visit Information  Last PT Received On: 08/27/12    Subjective Data  Subjective: "I've been doing that in the bed"  re: ankle range of motion Patient Stated Goal: to get relief from pain   Prior Functioning  Home Living Lives With: Family Available Help at Discharge: Family;Available 24 hours/day Type of Home: House Prior Function Level of Independence: Independent    Cognition  Cognition Arousal/Alertness: Awake/alert Behavior During Therapy: WFL for tasks assessed/performed  Overall Cognitive Status: Within Functional Limits for tasks assessed (pt with difficulty with multi level commands)    Extremity/Trunk Assessment Right Lower Extremity Assessment RLE  ROM/Strength/Tone: Within functional levels RLE Sensation: WFL - Light Touch;WFL - Proprioception Left Lower Extremity Assessment LLE ROM/Strength/Tone: Deficits;Due to pain LLE ROM/Strength/Tone Deficits: pt able to move ankle without difficulty, and can do short arc quad over folded pillow , but has pain LLE Sensation: WFL - Light Touch;WFL - Proprioception Trunk Assessment Trunk Assessment: Normal   Balance Balance Balance Assessed: Yes Static Sitting Balance Static Sitting - Balance Support: No upper extremity supported Static Sitting - Level of Assistance: 6: Modified independent (Device/Increase time) Static Standing Balance Static Standing - Balance Support: Bilateral upper extremity supported Static Standing - Level of Assistance: 5: Stand by assistance Static Standing - Comment/# of Minutes: 10 sec  End of Session    GP Functional Limitation: Changing and maintaining body position Changing and Maintaining Body Position Current Status (Z6109): 100 percent impaired, limited or restricted Changing and Maintaining Body Position Goal Status (U0454): At least 1 percent but less than 20 percent impaired, limited or restricted    Rosey Bath K. [place],  [phone] 08/27/2012, 12:42 PM

## 2012-08-27 NOTE — Progress Notes (Addendum)
   Subjective: Pelvic fractures   Patient reports pain as mild, pain well controlled. No events throughout the night. Orthopaedically stable.  Objective:   VITALS:   Filed Vitals:   08/27/12 1015  BP: 136/71  Pulse: 78  Temp: 98.4 F (36.9 C)  Resp: 18    Neurovascular intact Dorsiflexion/Plantar flexion intact No cellulitis present Compartment soft  LABS  Recent Labs  08/24/12 1930 08/25/12 0440  HGB 12.8 11.8*  HCT 37.5 34.1*  WBC 10.0 7.3  PLT 122* 113*     Recent Labs  08/24/12 1930 08/25/12 0440  NA 137 138  K 3.9 3.6  BUN 13 17  CREATININE 1.29* 1.39*  GLUCOSE 99 102*     Assessment/Plan: Pelvic fractures  Up with therapy No operation is needed, as reviewed with Dr. Charlann Boxer and Dr. Carola Frost Orthopaedically stable, ready for discharged when ready medically Should remain 50% WB on the left leg Hydrocodone Rx on chart for pain Follow up in 2 weeks at [facility]. Follow up with OLIN,Reynolds Kittel D in 2 weeks.  Contact information:  [facility] [address], [address] [place] [address] [phone]        Anastasio Auerbach. Minh [place]   PAC  08/27/2012, 3:47 PM

## 2012-08-28 MED ORDER — CLONAZEPAM 1 MG PO TABS: 1.0000 mg | ORAL_TABLET | Freq: Two times a day (BID) | ORAL | Status: DC

## 2012-08-28 MED ORDER — POLYETHYLENE GLYCOL 3350 17 G PO PACK: 17.0000 g | PACK | Freq: Two times a day (BID) | ORAL | Status: DC

## 2012-08-28 MED ORDER — CALCITONIN (SALMON) 200 UNIT/ACT NA SOLN: 1.0000 | Freq: Every day | NASAL | Status: DC

## 2012-08-28 MED ORDER — HYDROCODONE-ACETAMINOPHEN 5-325 MG PO TABS: 1.0000 | ORAL_TABLET | ORAL | Status: DC | PRN

## 2012-08-28 MED ORDER — SENNOSIDES-DOCUSATE SODIUM 8.6-50 MG PO TABS: 1.0000 | ORAL_TABLET | Freq: Two times a day (BID) | ORAL | Status: DC

## 2012-08-28 NOTE — Progress Notes (Signed)
Discharge report given to [facility] at [facility].  Awaiting for ambulance for transfer to SNF.

## 2012-08-28 NOTE — Clinical Social Work Note (Signed)
Patient to go to [facility] today via [facility]. Patient, patient's husband, and facility aware of transfer. Discharge packet complete. CSW signing off.  Ricke Hey, [place] [phone] (weekend)

## 2012-08-28 NOTE — Discharge Summary (Signed)
Physician Discharge Summary  NAME:Patricia Stanley  ZOX:[id number]  DOB: 10/20/1945   Admit date: 08/24/2012 Discharge date: 08/28/2012  Discharge Diagnoses:  Principal Problem:   Pelvic fracture - discharge to [facility] skilled nursing facility for further rehabilitation.  Patient nonambulatory and requiring 24-hour assistance for toileting dressing bathing and transfers Active Problems:   Fall at home - cell off of a chair while trying to obtain something from a cabinet in her kitchen   Migraines - symptomatically controlled   Renal insufficiency, mild - resolved   Major depression in partial remission   Osteoporosis, unspecified   Essential hypertension, benign - controlled   Discharge Physical Exam:  General Appearance: Alert, cooperative, appears stated age  Weight change:  No intake or output data in the 24 hours ending 08/28/12 0920 Filed Vitals:   08/26/12 2132 08/27/12 0544 08/27/12 1015 08/27/12 2052  BP: 118/68 137/79 136/71 128/77  Pulse: 77 79 78 68  Temp: 98.6 F (37 C) 98.2 F (36.8 C) 98.4 F (36.9 C) 99.1 F (37.3 C)  TempSrc: Oral Oral Oral Oral  Resp: 18 20 18 18   Height:      Weight:      SpO2: 95% 93% 96% 94%    Lungs: Clear to auscultation bilaterally, respirations unlabored Heart: Regular rate and rhythm, S1 and S2 normal, no murmur, rub or gallop Abdomen: Soft, non-tender, bowel sounds active all four quadrants, no masses, no organomegaly Extremities: Painful with rolling over and trying to sit in the left proximal thigh and pelvic area Neuro: Oriented x3, nonfocal.  Some confusion at times when taking medications for pain  Discharge Condition: Moderate improvement in pain  Hospital Course: 67 year old female with history of severe depression and anxiety and insomnia as well as migraine headaches and hypertension.  Larey Seat off a chair while trying to obtain something from an upper In her kitchen.  She landed on her buttocks.  She was able to stand and go  to the couch but after that could not stand.  She was found to have a left sacral, left superior and inferior pubic rami fractures, and also a left acetabular fracture.  Admitted for pain control and now will be discharged to [facility] skilled nursing facility in [place], and see for further rehabilitation prior to discharge home hopefully in several weeks  Things to follow up in the outpatient setting: Monitor her medical problems as above and manage pain  Consults: Treatment Team:  Marden Noble, MD  Disposition: Final discharge disposition not confirmed  Discharge Orders   Future Orders Complete By Expires     Call MD for:  difficulty breathing, headache or visual disturbances  As directed     Call MD for:  severe uncontrolled pain  As directed     Diet - low sodium heart healthy  As directed     Increase activity slowly  As directed     Other Restrictions  As directed     Comments:      Patient may only had 50% weightbearing on left lower extremity until reassessed by orthopedics, Dr. Myrene Galas in 2 weeks        Medication List    STOP taking these medications       GOODY HEADACHE PO      TAKE these medications       ABILIFY 5 MG tablet  Generic drug:  ARIPiprazole  Take 2.5 mg by mouth daily.     atenolol 50 MG tablet  Commonly known  as:  TENORMIN  Take 50 mg by mouth daily.     calcitonin (salmon) 200 UNIT/ACT nasal spray  Commonly known as:  MIACALCIN/FORTICAL  Place 1 spray into the nose daily.     CATAPRES 0.1 MG tablet  Generic drug:  cloNIDine  Take 0.1 mg by mouth 2 (two) times daily.     clonazePAM 1 MG tablet  Commonly known as:  KLONOPIN  Take 1 tablet (1 mg total) by mouth 2 (two) times daily.     FLUoxetine 20 MG capsule  Commonly known as:  PROZAC  Take 20 mg by mouth daily.     HYDROcodone-acetaminophen 5-325 MG per tablet  Commonly known as:  NORCO/VICODIN  Take 1-2 tablets by mouth every 4 (four) hours as needed for pain (1 tablet  for moderate pain and 2 tablets for severe pain every 4 hours when necessary).     polyethylene glycol packet  Commonly known as:  MIRALAX / GLYCOLAX  Take 17 g by mouth 2 (two) times daily.     senna-docusate 8.6-50 MG per tablet  Commonly known as:  Senokot-S  Take 1 tablet by mouth 2 (two) times daily.     temazepam 15 MG capsule  Commonly known as:  RESTORIL  Take 15 mg by mouth at bedtime as needed for sleep.     topiramate 100 MG tablet  Commonly known as:  TOPAMAX  Take 150 mg by mouth at bedtime. For headaches     traMADol 50 MG tablet  Commonly known as:  ULTRAM  Take 50 mg by mouth every 8 (eight) hours as needed for pain.           Follow-up Information   Follow up with Shelda Pal, MD. Schedule an appointment as soon as possible for a visit in 10 days. (call for appointment)    Contact information:   [address] [place] [address] [phone]       The results of significant diagnostics from this hospitalization (including imaging, microbiology, ancillary and laboratory) are listed below for reference.    Significant Diagnostic Studies: Dg Chest 1 View  08/24/2012  *RADIOLOGY REPORT*  Clinical Data: Fall.  CHEST - 1 VIEW  Comparison: None.  Findings: Remote trauma of posterolateral lower left ribs.  Minimal S-shaped thoracic spine curvature. Midline trachea.  Normal heart size with a tortuous descending thoracic aorta. No pleural effusion or pneumothorax.  Clear lungs.  IMPRESSION: No acute cardiopulmonary disease.   Original Report Authenticated By: Jeronimo Greaves, M.D.    Dg Thoracic Spine 2 View  08/24/2012  *RADIOLOGY REPORT*  Clinical Data: Trauma and pain.  THORACIC SPINE - 2 VIEW  Comparison: None.  Findings: Minimal S-shaped thoracic spine curvature.  Remote left rib trauma.  The lateral view images from approximately the top of T3 through bottom of L2.  Borderline to minimal loss of thoracic vertebral height at multiple levels,  including approximately T6, T7, T8, and T10.  These are of indeterminate acuity.  No canal compromise.  Attempted swimmer's view is nondiagnostic.  IMPRESSION: Incomplete evaluation of the upper thoracic spine.  Borderline/minimal loss of thoracic vertebral body height at multiple levels.  This is of indeterminate acuity.  Correlate with point tenderness.   Original Report Authenticated By: Jeronimo Greaves, M.D.    Dg Lumbar Spine Complete  08/24/2012  *RADIOLOGY REPORT*  Clinical Data: Back  pain secondary to a fall.  LUMBAR SPINE - COMPLETE 4+ VIEW  Comparison: None.  Findings: There is  no fracture, subluxation, disc space narrowing, facet arthritis, or other abnormality of the lumbar spine.   However, the patient does have fractures of the left inferior and superior pubic rami. I suspect the patient has a fracture of the left sacral ala.  Calcification in the abdominal aorta. There is a slight lumbar curvature which could be positional.  IMPRESSION: Acute fractures of the left inferior and superior pubic rami.  I suspect the patient has a fracture of the left side of the sacrum.  No significant abnormality of the lumbar spine.   Original Report Authenticated By: Francene Boyers, M.D.    Dg Hip Complete Left  08/24/2012  *RADIOLOGY REPORT*  Clinical Data: Trauma and pain.  LEFT HIP - COMPLETE 2+ VIEW  Comparison: Femur films same date  Findings: AP view pelvis and AP/frog-leg views of left hip. Femoral heads are located.  Extensive soft tissue calcifications.  No proximal femoral fracture.  Lucency through the left inferior pubic ramus,  acute fracture cannot be excluded.  There is also osseous irregularity about the parasymphyseal region bilaterally.  This could be degenerative or post-traumatic.  IMPRESSION: Probable left inferior pubic ramus fracture.  Concurrent parasymphyseal irregularity bilaterally which could be post- traumatic or degenerative.   Original Report Authenticated By: Jeronimo Greaves, M.D.    Dg  Femur Left  08/24/2012  *RADIOLOGY REPORT*  Clinical Data: Fall today with pain.  LEFT FEMUR - 2 VIEW  Comparison: Hip films same date  Findings: Soft tissue calcifications about the proximal left femur. No acute fracture or dislocation.  No knee joint effusion.  IMPRESSION: No acute osseous abnormality.   Original Report Authenticated By: Jeronimo Greaves, M.D.    Dg Tibia/fibula Left  08/24/2012  *RADIOLOGY REPORT*  Clinical Data: Trauma and pain.  LEFT TIBIA AND FIBULA - 2 VIEW  Comparison: Femur films same date  Findings: Mild degenerative irregularity of the medial compartment of the knee.  No fracture about the tibia or fibula.  IMPRESSION: No acute osseous abnormality.   Original Report Authenticated By: Jeronimo Greaves, M.D.    Ct Head Wo Contrast  08/24/2012  *RADIOLOGY REPORT*  Clinical Data: 67 year old female with fall and injury and pain.  CT HEAD WITHOUT CONTRAST  Technique:  Contiguous axial images were obtained from the base of the skull through the vertex without contrast.  Comparison: None  Findings: Bilateral basal ganglia infarcts are identified - age indeterminate but appear remote. Mild chronic small vessel white matter ischemic changes are identified. No acute intracranial abnormalities are identified, including mass lesion or mass effect, hydrocephalus, extra-axial fluid collection, midline shift, hemorrhage, or acute infarction.  The visualized bony calvarium is unremarkable.  IMPRESSION: No evidence of acute intracranial abnormality.  Remote appearing bilateral basal ganglia lacunar infarcts and mild chronic small vessel white matter ischemic changes.   Original Report Authenticated By: Harmon Pier, M.D.    Ct Pelvis Wo Contrast  08/24/2012  *RADIOLOGY REPORT*  Clinical Data: Fall, left hip pain  CT PELVIS WITHOUT CONTRAST  Technique:  Multidetector CT imaging of the pelvis was performed following the standard protocol without intravenous contrast.  Comparison: 08/24/2012 lumbar spine  radiograph and pelvis radiograph  Findings: Intraperitoneal contents show nothing acute.  Absent uterus.  No adnexal mass.  Bilateral subcutaneous calcifications are nonspecific.  There is a left sacral fracture adjacent to the SI joint, with a nondominant component extending into the SI joint as seen on series 3 image 65.  No diastases.  Fracture of the anterior column left acetabulum  with mild displacement.  The femoral head remains seated within the acetabulum.  No femoral fracture visualized.  Comminuted left inferior pubic ramus fracture. Nondisplaced parasymphyseal superior pubic ramus fracture on the left as seen on coronal image 41.  IMPRESSION: Left sacral ala fracture.  Left acetabular anterior column fracture.  Left superior and inferior pubic rami fractures.  Nonspecific subcutaneous calcifications.   Original Report Authenticated By: Jearld Lesch, M.D.     Microbiology: No results found for this or any previous visit (from the past 240 hour(s)).   Labs: Results for orders placed during the hospital encounter of 08/24/12  BASIC METABOLIC PANEL      Result Value Range   Sodium 137  135 - 145 mEq/L   Potassium 3.9  3.5 - 5.1 mEq/L   Chloride 106  96 - 112 mEq/L   CO2 21  19 - 32 mEq/L   Glucose, Bld 99  70 - 99 mg/dL   BUN 13  6 - 23 mg/dL   Creatinine, Ser 1.61 (*) 0.50 - 1.10 mg/dL   Calcium 9.3  8.4 - 09.6 mg/dL   GFR calc non Af Amer 42 (*) >90 mL/min   GFR calc Af Amer 49 (*) >90 mL/min  CBC WITH DIFFERENTIAL      Result Value Range   WBC 10.0  4.0 - 10.5 K/uL   RBC 4.01  3.87 - 5.11 MIL/uL   Hemoglobin 12.8  12.0 - 15.0 g/dL   HCT 04.5  40.9 - 81.1 %   MCV 93.5  78.0 - 100.0 fL   MCH 31.9  26.0 - 34.0 pg   MCHC 34.1  30.0 - 36.0 g/dL   RDW 91.4  78.2 - 95.6 %   Platelets 122 (*) 150 - 400 K/uL   Neutrophils Relative 85 (*) 43 - 77 %   Lymphocytes Relative 8 (*) 12 - 46 %   Monocytes Relative 6  3 - 12 %   Eosinophils Relative 1  0 - 5 %   Basophils Relative 0  0  - 1 %   Neutro Abs 8.5 (*) 1.7 - 7.7 K/uL   Lymphs Abs 0.8  0.7 - 4.0 K/uL   Monocytes Absolute 0.6  0.1 - 1.0 K/uL   Eosinophils Absolute 0.1  0.0 - 0.7 K/uL   Basophils Absolute 0.0  0.0 - 0.1 K/uL   Smear Review MORPHOLOGY UNREMARKABLE    PROTIME-INR      Result Value Range   Prothrombin Time 13.6  11.6 - 15.2 seconds   INR 1.05  0.00 - 1.49  BASIC METABOLIC PANEL      Result Value Range   Sodium 138  135 - 145 mEq/L   Potassium 3.6  3.5 - 5.1 mEq/L   Chloride 107  96 - 112 mEq/L   CO2 24  19 - 32 mEq/L   Glucose, Bld 102 (*) 70 - 99 mg/dL   BUN 17  6 - 23 mg/dL   Creatinine, Ser 2.13 (*) 0.50 - 1.10 mg/dL   Calcium 8.8  8.4 - 08.6 mg/dL   GFR calc non Af Amer 39 (*) >90 mL/min   GFR calc Af Amer 45 (*) >90 mL/min  CBC      Result Value Range   WBC 7.3  4.0 - 10.5 K/uL   RBC 3.62 (*) 3.87 - 5.11 MIL/uL   Hemoglobin 11.8 (*) 12.0 - 15.0 g/dL   HCT 57.8 (*) 46.9 - 62.9 %   MCV 94.2  78.0 - 100.0 fL   MCH 32.6  26.0 - 34.0 pg   MCHC 34.6  30.0 - 36.0 g/dL   RDW 21.3  08.6 - 57.8 %   Platelets 113 (*) 150 - 400 K/uL  TYPE AND SCREEN      Result Value Range   ABO/RH(D) A POS     Antibody Screen NEG     Sample Expiration 08/27/2012    ABO/RH      Result Value Range   ABO/RH(D) A POS      Time coordinating discharge: 25 minutes  Signed: Pearla Dubonnet, MD 08/28/2012, 9:20 AM

## 2013-05-05 DIAGNOSIS — S322XXA Fracture of coccyx, initial encounter for closed fracture: Secondary | ICD-10-CM

## 2013-05-05 HISTORY — DX: Fracture of coccyx, initial encounter for closed fracture: S32.2XXA

## 2014-04-14 ENCOUNTER — Other Ambulatory Visit (HOSPITAL_COMMUNITY): Payer: Self-pay | Admitting: Internal Medicine

## 2014-04-14 ENCOUNTER — Ambulatory Visit (HOSPITAL_COMMUNITY)
Admission: RE | Admit: 2014-04-14 | Discharge: 2014-04-14 | Disposition: A | Discharge status: Home / Self Care | Payer: Commercial Managed Care - HMO | Source: Ambulatory Visit | Attending: Cardiology | Admitting: Cardiology

## 2014-04-14 DIAGNOSIS — R0789 Other chest pain: Secondary | ICD-10-CM

## 2014-04-14 DIAGNOSIS — R0602 Shortness of breath: Secondary | ICD-10-CM | POA: Insufficient documentation

## 2014-04-14 DIAGNOSIS — I10 Essential (primary) hypertension: Secondary | ICD-10-CM | POA: Diagnosis not present

## 2014-04-14 DIAGNOSIS — Z6822 Body mass index (BMI) 22.0-22.9, adult: Secondary | ICD-10-CM | POA: Diagnosis not present

## 2014-04-14 DIAGNOSIS — R079 Chest pain, unspecified: Secondary | ICD-10-CM | POA: Insufficient documentation

## 2014-04-14 MED ORDER — REGADENOSON 0.4 MG/5ML IV SOLN
0.4000 mg | Freq: Once | INTRAVENOUS | Status: AC
  Administered 2014-04-14: 0.4 mg via INTRAVENOUS

## 2014-04-14 MED ORDER — TECHNETIUM TC 99M SESTAMIBI GENERIC - CARDIOLITE
9.9000 | Freq: Once | INTRAVENOUS | Status: AC | PRN
  Administered 2014-04-14: 10 via INTRAVENOUS

## 2014-04-14 MED ORDER — AMINOPHYLLINE 25 MG/ML IV SOLN
75.0000 mg | Freq: Once | INTRAVENOUS | Status: AC
  Administered 2014-04-14: 75 mg via INTRAVENOUS

## 2014-04-14 MED ORDER — TECHNETIUM TC 99M SESTAMIBI GENERIC - CARDIOLITE
29.8000 | Freq: Once | INTRAVENOUS | Status: AC | PRN
  Administered 2014-04-14: 29.8 via INTRAVENOUS

## 2014-04-14 NOTE — Procedures (Addendum)
[facility] [address] [place] [address] [phone]  Cardiology Nuclear Med Study  Durel SaltsMartha J Stanley is a 68 y.o. female     MRN : [id number]     DOB: 12/10/1945  Procedure Date: 04/14/2014  Nuclear Med Background Indication for Stress Test:  Evaluation for Ischemia History:  No prior cardiac or respiratory history reported;No prior NUC MPI for comparison. Cardiac Risk Factors: Hypertension  Symptoms:  Chest Pain and SOB   Nuclear Pre-Procedure Caffeine/Decaff Intake:  7:00pm NPO After: 5:00am   IV Site: R Forearm  IV 0.9% NS with Angio Cath:  22g  Chest Size (in):  n/a IV Started by: Berdie OgrenAmanda Wease, RN  Height: 5\' 1"  (1.549 m)  Cup Size: B  BMI:  Body mass index is 22.87 kg/(m^2). Weight:  121 lb (54.885 kg)   Tech Comments:  n/a    Nuclear Med Study 1 or 2 day study: 1 day  Stress Test Type:  Lexiscan  Order Authorizing Provider:  Shaune Pollackonna Gates, MD   Resting Radionuclide: Technetium 957m Sestamibi  Resting Radionuclide Dose: 9.9 mCi   Stress Radionuclide:  Technetium 9157m Sestamibi  Stress Radionuclide Dose: 29.8 mCi           Stress Protocol Rest HR: 63 Stress HR: 82  Rest BP: 128/61 Stress BP: 143/84  Exercise Time (min): n/a METS: n/a          Dose of Adenosine (mg):  n/a Dose of Lexiscan: 0.4 mg  Dose of Atropine (mg): n/a Dose of Dobutamine: n/a mcg/kg/min (at max HR)  Stress Test Technologist: Ernestene MentionGwen Farrington, CCT Nuclear Technologist: Gonzella LexPam Phillips, CNMT   Rest Procedure:  Myocardial perfusion imaging was performed at rest 45 minutes following the intravenous administration of Technetium 8457m Sestamibi. Stress Procedure:  The patient received IV Lexiscan 0.4 mg over 15-seconds.  Technetium 9957m Sestamibi injected IV at 30-seconds.  Patient experienced shortness of breath and was administered 75 mg of Aminophylline IV. There were no significant changes with Lexiscan.  Quantitative spect images were  obtained after a 45 minute delay.  Transient Ischemic Dilatation (Normal <1.22):  1.29  QGS EDV:  56 ml QGS ESV:  21 ml LV Ejection Fraction: 63%      Rest ECG: NSR, LVH, inferior lateral TWI.  Stress ECG: No significant change from baseline ECG  QPS Raw Data Images:  Acquisition technically good; normal left ventricular size. Stress Images:  There is decreased uptake in the apical lateral wall. Rest Images:  Normal homogeneous uptake in all areas of the myocardium. Subtraction (SDS):  These findings are consistent with ischemia.  Impression Exercise Capacity:  Lexiscan with no exercise. BP Response:  Normal blood pressure response. Clinical Symptoms:  There is dyspnea. ECG Impression:  No significant ST segment change suggestive of ischemia. Comparison with Prior Nuclear Study: No previous nuclear study performed  Overall Impression:  Low risk stress nuclear study with a small, severe intensity, reversible apical lateral defect consistent with mild ischemia.  LV Wall Motion:  NL LV Function; NL Wall Motion   Patricia MillersBrian Crenshaw, MD  04/14/2014 3:50 PM

## 2014-04-17 ENCOUNTER — Encounter (HOSPITAL_COMMUNITY): Payer: Medicare Other

## 2015-02-14 ENCOUNTER — Other Ambulatory Visit: Payer: Self-pay

## 2015-02-14 DIAGNOSIS — F419 Anxiety disorder, unspecified: Secondary | ICD-10-CM | POA: Diagnosis not present

## 2015-02-14 DIAGNOSIS — F329 Major depressive disorder, single episode, unspecified: Secondary | ICD-10-CM | POA: Diagnosis not present

## 2015-02-14 DIAGNOSIS — I10 Essential (primary) hypertension: Secondary | ICD-10-CM | POA: Diagnosis not present

## 2015-02-14 DIAGNOSIS — G43009 Migraine without aura, not intractable, without status migrainosus: Secondary | ICD-10-CM | POA: Diagnosis not present

## 2015-02-14 DIAGNOSIS — R634 Abnormal weight loss: Secondary | ICD-10-CM | POA: Diagnosis not present

## 2015-02-14 DIAGNOSIS — I2510 Atherosclerotic heart disease of native coronary artery without angina pectoris: Secondary | ICD-10-CM | POA: Diagnosis not present

## 2015-02-14 DIAGNOSIS — J309 Allergic rhinitis, unspecified: Secondary | ICD-10-CM | POA: Diagnosis not present

## 2015-02-14 DIAGNOSIS — Z1231 Encounter for screening mammogram for malignant neoplasm of breast: Secondary | ICD-10-CM

## 2015-02-14 DIAGNOSIS — G4700 Insomnia, unspecified: Secondary | ICD-10-CM | POA: Diagnosis not present

## 2015-02-21 ENCOUNTER — Ambulatory Visit: Payer: Commercial Managed Care - HMO

## 2015-03-06 ENCOUNTER — Ambulatory Visit: Payer: Commercial Managed Care - HMO

## 2015-09-06 DIAGNOSIS — I2510 Atherosclerotic heart disease of native coronary artery without angina pectoris: Secondary | ICD-10-CM | POA: Diagnosis not present

## 2015-09-06 DIAGNOSIS — K219 Gastro-esophageal reflux disease without esophagitis: Secondary | ICD-10-CM | POA: Diagnosis not present

## 2015-09-06 DIAGNOSIS — B001 Herpesviral vesicular dermatitis: Secondary | ICD-10-CM | POA: Diagnosis not present

## 2015-09-06 DIAGNOSIS — F329 Major depressive disorder, single episode, unspecified: Secondary | ICD-10-CM | POA: Diagnosis not present

## 2015-09-06 DIAGNOSIS — I10 Essential (primary) hypertension: Secondary | ICD-10-CM | POA: Diagnosis not present

## 2015-09-06 DIAGNOSIS — R300 Dysuria: Secondary | ICD-10-CM | POA: Diagnosis not present

## 2015-09-06 DIAGNOSIS — G4700 Insomnia, unspecified: Secondary | ICD-10-CM | POA: Diagnosis not present

## 2015-09-06 DIAGNOSIS — K1379 Other lesions of oral mucosa: Secondary | ICD-10-CM | POA: Diagnosis not present

## 2015-09-06 DIAGNOSIS — F419 Anxiety disorder, unspecified: Secondary | ICD-10-CM | POA: Diagnosis not present

## 2015-09-06 DIAGNOSIS — E559 Vitamin D deficiency, unspecified: Secondary | ICD-10-CM | POA: Diagnosis not present

## 2015-09-06 DIAGNOSIS — R634 Abnormal weight loss: Secondary | ICD-10-CM | POA: Diagnosis not present

## 2015-09-18 ENCOUNTER — Other Ambulatory Visit: Payer: Self-pay | Admitting: Internal Medicine

## 2015-09-18 DIAGNOSIS — R634 Abnormal weight loss: Secondary | ICD-10-CM | POA: Diagnosis not present

## 2015-09-18 DIAGNOSIS — K219 Gastro-esophageal reflux disease without esophagitis: Secondary | ICD-10-CM | POA: Diagnosis not present

## 2015-09-18 DIAGNOSIS — K1379 Other lesions of oral mucosa: Secondary | ICD-10-CM | POA: Diagnosis not present

## 2015-09-18 DIAGNOSIS — R109 Unspecified abdominal pain: Secondary | ICD-10-CM | POA: Diagnosis not present

## 2015-09-18 DIAGNOSIS — Z79899 Other long term (current) drug therapy: Secondary | ICD-10-CM | POA: Diagnosis not present

## 2015-09-18 DIAGNOSIS — B001 Herpesviral vesicular dermatitis: Secondary | ICD-10-CM | POA: Diagnosis not present

## 2015-09-18 DIAGNOSIS — F329 Major depressive disorder, single episode, unspecified: Secondary | ICD-10-CM | POA: Diagnosis not present

## 2015-09-18 DIAGNOSIS — R197 Diarrhea, unspecified: Secondary | ICD-10-CM | POA: Diagnosis not present

## 2015-09-25 ENCOUNTER — Ambulatory Visit
Admission: RE | Admit: 2015-09-25 | Discharge: 2015-09-25 | Disposition: A | Discharge status: Home / Self Care | Payer: Commercial Managed Care - HMO | Source: Ambulatory Visit | Attending: Internal Medicine | Admitting: Internal Medicine

## 2015-09-25 DIAGNOSIS — R109 Unspecified abdominal pain: Secondary | ICD-10-CM

## 2015-09-25 DIAGNOSIS — N200 Calculus of kidney: Secondary | ICD-10-CM | POA: Diagnosis not present

## 2015-10-31 DIAGNOSIS — R197 Diarrhea, unspecified: Secondary | ICD-10-CM | POA: Diagnosis not present

## 2015-10-31 DIAGNOSIS — R7301 Impaired fasting glucose: Secondary | ICD-10-CM | POA: Diagnosis not present

## 2015-10-31 DIAGNOSIS — R634 Abnormal weight loss: Secondary | ICD-10-CM | POA: Diagnosis not present

## 2015-11-01 ENCOUNTER — Other Ambulatory Visit: Payer: Self-pay | Admitting: Gastroenterology

## 2015-11-29 ENCOUNTER — Encounter (HOSPITAL_COMMUNITY): Payer: Self-pay | Admitting: *Deleted

## 2015-12-03 ENCOUNTER — Encounter (HOSPITAL_COMMUNITY): Payer: Self-pay

## 2015-12-03 ENCOUNTER — Encounter (HOSPITAL_COMMUNITY)
Admission: RE | Disposition: A | Discharge status: Home / Self Care | Payer: Self-pay | Source: Ambulatory Visit | Attending: Gastroenterology

## 2015-12-03 ENCOUNTER — Ambulatory Visit (HOSPITAL_COMMUNITY)
Admission: RE | Admit: 2015-12-03 | Discharge: 2015-12-03 | Disposition: A | Discharge status: Home / Self Care | Payer: Commercial Managed Care - HMO | Source: Ambulatory Visit | Attending: Gastroenterology | Admitting: Gastroenterology

## 2015-12-03 ENCOUNTER — Ambulatory Visit (HOSPITAL_COMMUNITY): Payer: Commercial Managed Care - HMO | Admitting: Anesthesiology

## 2015-12-03 DIAGNOSIS — K219 Gastro-esophageal reflux disease without esophagitis: Secondary | ICD-10-CM | POA: Insufficient documentation

## 2015-12-03 DIAGNOSIS — G43909 Migraine, unspecified, not intractable, without status migrainosus: Secondary | ICD-10-CM | POA: Insufficient documentation

## 2015-12-03 DIAGNOSIS — Z6822 Body mass index (BMI) 22.0-22.9, adult: Secondary | ICD-10-CM | POA: Insufficient documentation

## 2015-12-03 DIAGNOSIS — K529 Noninfective gastroenteritis and colitis, unspecified: Secondary | ICD-10-CM | POA: Insufficient documentation

## 2015-12-03 DIAGNOSIS — Z7982 Long term (current) use of aspirin: Secondary | ICD-10-CM | POA: Insufficient documentation

## 2015-12-03 DIAGNOSIS — R634 Abnormal weight loss: Secondary | ICD-10-CM | POA: Insufficient documentation

## 2015-12-03 DIAGNOSIS — E7800 Pure hypercholesterolemia, unspecified: Secondary | ICD-10-CM | POA: Insufficient documentation

## 2015-12-03 DIAGNOSIS — Z1211 Encounter for screening for malignant neoplasm of colon: Secondary | ICD-10-CM | POA: Diagnosis not present

## 2015-12-03 DIAGNOSIS — I2510 Atherosclerotic heart disease of native coronary artery without angina pectoris: Secondary | ICD-10-CM | POA: Diagnosis not present

## 2015-12-03 DIAGNOSIS — K639 Disease of intestine, unspecified: Secondary | ICD-10-CM | POA: Diagnosis not present

## 2015-12-03 DIAGNOSIS — Z79899 Other long term (current) drug therapy: Secondary | ICD-10-CM | POA: Insufficient documentation

## 2015-12-03 DIAGNOSIS — R197 Diarrhea, unspecified: Secondary | ICD-10-CM | POA: Diagnosis not present

## 2015-12-03 DIAGNOSIS — F329 Major depressive disorder, single episode, unspecified: Secondary | ICD-10-CM | POA: Diagnosis not present

## 2015-12-03 DIAGNOSIS — K900 Celiac disease: Secondary | ICD-10-CM | POA: Diagnosis not present

## 2015-12-03 DIAGNOSIS — R1013 Epigastric pain: Secondary | ICD-10-CM | POA: Diagnosis not present

## 2015-12-03 DIAGNOSIS — R748 Abnormal levels of other serum enzymes: Secondary | ICD-10-CM | POA: Diagnosis not present

## 2015-12-03 DIAGNOSIS — Z87891 Personal history of nicotine dependence: Secondary | ICD-10-CM | POA: Diagnosis not present

## 2015-12-03 DIAGNOSIS — I10 Essential (primary) hypertension: Secondary | ICD-10-CM | POA: Insufficient documentation

## 2015-12-03 HISTORY — PX: ESOPHAGOGASTRODUODENOSCOPY (EGD) WITH PROPOFOL: SHX5813

## 2015-12-03 HISTORY — DX: Headache: R51

## 2015-12-03 HISTORY — DX: Essential (primary) hypertension: I10

## 2015-12-03 HISTORY — DX: Depression, unspecified: F32.A

## 2015-12-03 HISTORY — PX: COLONOSCOPY WITH PROPOFOL: SHX5780

## 2015-12-03 HISTORY — DX: Major depressive disorder, single episode, unspecified: F32.9

## 2015-12-03 HISTORY — DX: Gastro-esophageal reflux disease without esophagitis: K21.9

## 2015-12-03 HISTORY — DX: Personal history of urinary calculi: Z87.442

## 2015-12-03 HISTORY — DX: Fracture of coccyx, initial encounter for closed fracture: S32.2XXA

## 2015-12-03 HISTORY — DX: Headache, unspecified: R51.9

## 2015-12-03 SURGERY — COLONOSCOPY WITH PROPOFOL
Anesthesia: Monitor Anesthesia Care

## 2015-12-03 MED ORDER — SODIUM CHLORIDE 0.9 % IV SOLN: INTRAVENOUS | Status: DC

## 2015-12-03 MED ORDER — LIDOCAINE HCL (CARDIAC) 20 MG/ML IV SOLN
INTRAVENOUS | Status: DC | PRN
  Administered 2015-12-03: 25 mg via INTRATRACHEAL

## 2015-12-03 MED ORDER — PROPOFOL 10 MG/ML IV BOLUS
INTRAVENOUS | Status: AC
  Filled 2015-12-03: qty 60

## 2015-12-03 MED ORDER — PROPOFOL 500 MG/50ML IV EMUL
INTRAVENOUS | Status: DC | PRN
  Administered 2015-12-03: 125 ug/kg/min via INTRAVENOUS

## 2015-12-03 MED ORDER — LACTATED RINGERS IV SOLN
INTRAVENOUS | Status: DC
  Administered 2015-12-03: 1000 mL via INTRAVENOUS

## 2015-12-03 MED ORDER — PROPOFOL 10 MG/ML IV BOLUS
INTRAVENOUS | Status: DC | PRN
  Administered 2015-12-03: 20 mg via INTRAVENOUS

## 2015-12-03 SURGICAL SUPPLY — 24 items

## 2015-12-03 NOTE — Transfer of Care (Signed)
Immediate Anesthesia Transfer of Care Note  Patient: Patricia Stanley  Procedure(s) Performed: Procedure(s): COLONOSCOPY WITH PROPOFOL (N/A) ESOPHAGOGASTRODUODENOSCOPY (EGD) WITH PROPOFOL (N/A)  Patient Location: PACU and Endoscopy Unit  Anesthesia Type:MAC  Level of Consciousness: patient cooperative  Airway & Oxygen Therapy: Patient Spontanous Breathing and Patient connected to nasal cannula oxygen  Post-op Assessment: Report given to RN and Post -op Vital signs reviewed and stable  Post vital signs: Reviewed and stable  Last Vitals:  Vitals:   12/03/15 0725  BP: (!) 150/65  Pulse: 61  Resp: 12  Temp: 36.8 C    Last Pain:  Vitals:   12/03/15 0725  TempSrc: Oral         Complications: No apparent anesthesia complications

## 2015-12-03 NOTE — Op Note (Signed)
[facility] Patient Name: Patricia Stanley Procedure Date: 12/03/2015 MRN: [id number] Attending MD: Charolett Bumpers , MD Date of Birth: 1946/04/17 CSN: [id number] Age: 70 Admit Type: Outpatient Procedure:                Upper GI endoscopy Indications:              Epigastric abdominal pain, Diarrhea Providers:                Charolett Bumpers, MD, Omelia Blackwater RN, RN, Lorenda Ishihara, Technician, Delphia Grates, CRNA Referring MD:              Medicines:                Propofol per Anesthesia Complications:            No immediate complications. Estimated Blood Loss:     Estimated blood loss: none. Procedure:                Pre-Anesthesia Assessment:                           - Prior to the procedure, a History and Physical                            was performed, and patient medications and                            allergies were reviewed. The patient's tolerance of                            previous anesthesia was also reviewed. The risks                            and benefits of the procedure and the sedation                            options and risks were discussed with the patient.                            All questions were answered, and informed consent                            was obtained. Prior Anticoagulants: The patient has                            taken aspirin, last dose was 1 day prior to                            procedure. ASA Grade Assessment: III - A patient                            with severe systemic disease. After reviewing the  risks and benefits, the patient was deemed in                            satisfactory condition to undergo the procedure.                           After obtaining informed consent, the endoscope was                            passed under direct vision. Throughout the                            procedure, the patient's blood pressure, pulse, and                      oxygen saturations were monitored continuously. The                            EG-[id number] ([id number]) scope was introduced through the                            mouth, and advanced to the second part of duodenum.                            The upper GI endoscopy was accomplished without                            difficulty. The patient tolerated the procedure                            well. Scope In: Scope Out: Findings:      The Z-line was regular and was found 37 cm from the incisors.      The examined esophagus was normal.      The entire examined stomach was normal. Biopsies were taken with a cold       forceps for histology.      The examined duodenum was normal. Biopsies for histology were taken with       a cold forceps for evaluation of celiac disease. Impression:               - Z-line regular, 37 cm from the incisors.                           - Normal esophagus.                           - Normal stomach. Biopsied.                           - Normal examined duodenum. Biopsied. Moderate Sedation:      N/A- Per Anesthesia Care Recommendation:           - Patient has a contact number available for                            emergencies. The signs and symptoms of potential  delayed complications were discussed with the                            patient. Return to normal activities tomorrow.                            Written discharge instructions were provided to the                            patient.                           - Await pathology results.                           - Resume previous diet.                           - Continue present medications. Procedure Code(s):        --- Professional ---                           [id number], Esophagogastroduodenoscopy, flexible,                            transoral; with biopsy, single or multiple Diagnosis Code(s):        --- Professional ---                           R10.13,  Epigastric pain                           R19.7, Diarrhea, unspecified CPT copyright 2016 American Medical Association. All rights reserved. The codes documented in this report are preliminary and upon coder review may  be revised to meet current compliance requirements. Danise Edge, MD Charolett Bumpers, MD 12/03/2015 9:25:17 AM This report has been signed electronically. Number of Addenda: 0

## 2015-12-03 NOTE — Anesthesia Postprocedure Evaluation (Signed)
Anesthesia Post Note  Patient: Patricia Stanley  Procedure(s) Performed: Procedure(s) (LRB): COLONOSCOPY WITH PROPOFOL (N/A) ESOPHAGOGASTRODUODENOSCOPY (EGD) WITH PROPOFOL (N/A)  Anesthesia Post Evaluation  Last Vitals:  Vitals:   12/03/15 0945 12/03/15 0946  BP:  (!) 155/64  Pulse: (!) 59 (!) 58  Resp: 18 16  Temp:      Last Pain:  Vitals:   12/03/15 0725  TempSrc: Oral                 Alysah Carton EDWARD

## 2015-12-03 NOTE — Anesthesia Preprocedure Evaluation (Signed)
Anesthesia Evaluation  Patient identified by MRN, date of birth, ID band Patient awake    Reviewed: Allergy & Precautions, H&P , Patient's Chart, lab work & pertinent test results, reviewed documented beta blocker date and time   Airway Mallampati: II  TM Distance: >3 FB Neck ROM: full    Dental no notable dental hx.    Pulmonary former smoker,    Pulmonary exam normal breath sounds clear to auscultation       Cardiovascular hypertension, On Medications  Rhythm:regular Rate:Normal     Neuro/Psych    GI/Hepatic GERD  ,  Endo/Other    Renal/GU      Musculoskeletal   Abdominal   Peds  Hematology   Anesthesia Other Findings   Reproductive/Obstetrics                             Anesthesia Physical Anesthesia Plan  ASA: II  Anesthesia Plan: MAC   Post-op Pain Management:    Induction: Intravenous  Airway Management Planned: Mask and Natural Airway  Additional Equipment:   Intra-op Plan:   Post-operative Plan:   Informed Consent: I have reviewed the patients History and Physical, chart, labs and discussed the procedure including the risks, benefits and alternatives for the proposed anesthesia with the patient or authorized representative who has indicated his/her understanding and acceptance.   Dental Advisory Given  Plan Discussed with: CRNA and Surgeon  Anesthesia Plan Comments: (Discussed sedation and potential to need to place airway or ETT if warranted by clinical changes intra-operatively. We will start procedure as MAC.)        Anesthesia Quick Evaluation

## 2015-12-03 NOTE — Op Note (Signed)
[facility] Patient Name: Patricia Stanley Procedure Date: 12/03/2015 MRN: [id number] Attending MD: Charolett Bumpers , MD Date of Birth: [birth year] CSN: [id number] Age: 70 Admit Type: Outpatient Procedure:                Colonoscopy Indications:              Screening for colorectal malignant neoplasm,                            Incidental - Chronic watery diarrhea Providers:                Charolett Bumpers, MD, Omelia Blackwater RN, RN, Lorenda Ishihara, Technician, Delphia Grates, CRNA Referring MD:              Medicines:                Propofol per Anesthesia Complications:            No immediate complications. Estimated Blood Loss:     Estimated blood loss: none. Procedure:                Pre-Anesthesia Assessment:                           - Prior to the procedure, a History and Physical                            was performed, and patient medications and                            allergies were reviewed. The patient's tolerance of                            previous anesthesia was also reviewed. The risks                            and benefits of the procedure and the sedation                            options and risks were discussed with the patient.                            All questions were answered, and informed consent                            was obtained. Prior Anticoagulants: The patient has                            taken aspirin, last dose was 1 day prior to                            procedure. ASA Grade Assessment: III - A patient  with severe systemic disease. After reviewing the                            risks and benefits, the patient was deemed in                            satisfactory condition to undergo the procedure.                           After obtaining informed consent, the colonoscope                            was passed under direct vision. Throughout the                             procedure, the patient's blood pressure, pulse, and                            oxygen saturations were monitored continuously. The                            EC-[id number] ([id number]) scope was introduced through                            the anus and advanced to the the cecum, identified                            by appendiceal orifice and ileocecal valve. The                            colonoscopy was performed without difficulty. The                            patient tolerated the procedure well. The quality                            of the bowel preparation was good. The appendiceal                            orifice and the rectum were photographed. Scope In: 8:54:29 AM Scope Out: 9:18:57 AM Scope Withdrawal Time: 0 hours 13 minutes 53 seconds  Total Procedure Duration: 0 hours 24 minutes 28 seconds  Findings:      The perianal and digital rectal examinations were normal.      The entire examined colon appeared normal.      Biopsies for histology were taken with a cold forceps from the cecum,       ascending colon and descending colon for evaluation of microscopic       colitis. Impression:               - The entire examined colon is normal.                           - Biopsies were taken with a cold forceps from the  cecum, ascending colon and descending colon for                            evaluation of microscopic colitis. Moderate Sedation:      N/A- Per Anesthesia Care Recommendation:           - Patient has a contact number available for                            emergencies. The signs and symptoms of potential                            delayed complications were discussed with the                            patient. Return to normal activities tomorrow.                            Written discharge instructions were provided to the                            patient.                           - Repeat colonoscopy in 10 years for screening                             purposes.                           - Resume previous diet.                           - Continue present medications. Procedure Code(s):        --- Professional ---                           [phone], Colonoscopy, flexible; with biopsy, single                            or multiple Diagnosis Code(s):        --- Professional ---                           Z12.11, Encounter for screening for malignant                            neoplasm of colon CPT copyright 2016 [employer]. All rights reserved. The codes documented in this report are preliminary and upon coder review may  be revised to meet current compliance requirements. Danise Edge, MD Charolett Bumpers, MD 12/03/2015 9:30:13 AM This report has been signed electronically. Number of Addenda: 0

## 2015-12-03 NOTE — Discharge Instructions (Signed)

## 2015-12-03 NOTE — H&P (Signed)
  Problems: Chronic watery diarrhea and unintentional weight loss (22 pound weight loss since December 2015).  History: The patient is a 70 year old female born 04/07/1946. Her last screening colonoscopy was performed over 10 years ago.  The patient experiences bowel urgency with the passage of small volume loose but not bloody bowel movements after each meal but denies nocturnal diarrhea. She also experiences epigastric discomfort with nausea when she consumes food. She denies vomiting or dysphagia. She takes a baby aspirin daily. She takes Protonix on a daily basis. She denies a family history of celiac disease. Tissue transglutaminase antibody was negative.  Differential diagnosis would include celiac disease, microscopic colitis, H pylori gastritis, colorectal neoplasia.  The patient is scheduled to undergo diagnostic esophagogastroduodenoscopy with screen for H. pylori gastritis and celiac disease followed by diagnostic colonoscopy with screen for microscopic colitis.  Past medical history: Migraine headache syndrome. Hypertension. Hypercholesterolemia. Allergic rhinitis. Major depression. Anxiety. Insomnia. Coronary artery disease. Total abdominal hysterectomy. Right ovary removal.  Medication allergies: Paxil caused lightheadedness  Exam: The patient is alert and lying comfortably on the endoscopy stretcher. Abdomen is soft and nontender to palpation. Lungs are clear to auscultation. Cardiac exam reveals a regular rhythm.  Plan: Proceed with diagnostic esophagogastroduodenoscopy, screen for celiac disease, screen for H. pylori gastritis, and colonoscopy with screen for microscopic colitis.

## 2015-12-04 ENCOUNTER — Encounter (HOSPITAL_COMMUNITY): Payer: Self-pay | Admitting: Gastroenterology

## 2015-12-12 ENCOUNTER — Other Ambulatory Visit: Payer: Self-pay | Admitting: Gastroenterology

## 2015-12-12 DIAGNOSIS — R110 Nausea: Secondary | ICD-10-CM

## 2015-12-12 DIAGNOSIS — R634 Abnormal weight loss: Secondary | ICD-10-CM

## 2015-12-20 ENCOUNTER — Inpatient Hospital Stay: Admission: RE | Admit: 2015-12-20 | Payer: Commercial Managed Care - HMO | Source: Ambulatory Visit

## 2015-12-27 ENCOUNTER — Ambulatory Visit
Admission: RE | Admit: 2015-12-27 | Discharge: 2015-12-27 | Disposition: A | Discharge status: Home / Self Care | Payer: Commercial Managed Care - HMO | Source: Ambulatory Visit | Attending: Gastroenterology | Admitting: Gastroenterology

## 2015-12-27 DIAGNOSIS — R110 Nausea: Secondary | ICD-10-CM

## 2015-12-27 DIAGNOSIS — R634 Abnormal weight loss: Secondary | ICD-10-CM

## 2015-12-27 MED ORDER — IOPAMIDOL (ISOVUE-300) INJECTION 61%
100.0000 mL | Freq: Once | INTRAVENOUS | Status: AC | PRN
  Administered 2015-12-27: 100 mL via INTRAVENOUS

## 2016-03-19 ENCOUNTER — Other Ambulatory Visit: Payer: Self-pay | Admitting: Internal Medicine

## 2016-03-19 DIAGNOSIS — Z1389 Encounter for screening for other disorder: Secondary | ICD-10-CM | POA: Diagnosis not present

## 2016-03-19 DIAGNOSIS — Z79899 Other long term (current) drug therapy: Secondary | ICD-10-CM | POA: Diagnosis not present

## 2016-03-19 DIAGNOSIS — I10 Essential (primary) hypertension: Secondary | ICD-10-CM | POA: Diagnosis not present

## 2016-03-19 DIAGNOSIS — Z23 Encounter for immunization: Secondary | ICD-10-CM | POA: Diagnosis not present

## 2016-03-19 DIAGNOSIS — Z0000 Encounter for general adult medical examination without abnormal findings: Secondary | ICD-10-CM | POA: Diagnosis not present

## 2016-03-19 DIAGNOSIS — Z1231 Encounter for screening mammogram for malignant neoplasm of breast: Secondary | ICD-10-CM

## 2016-03-19 DIAGNOSIS — E7800 Pure hypercholesterolemia, unspecified: Secondary | ICD-10-CM | POA: Diagnosis not present

## 2016-03-19 DIAGNOSIS — K219 Gastro-esophageal reflux disease without esophagitis: Secondary | ICD-10-CM | POA: Diagnosis not present

## 2016-03-19 DIAGNOSIS — B001 Herpesviral vesicular dermatitis: Secondary | ICD-10-CM | POA: Diagnosis not present

## 2016-03-19 DIAGNOSIS — I2510 Atherosclerotic heart disease of native coronary artery without angina pectoris: Secondary | ICD-10-CM | POA: Diagnosis not present

## 2016-03-19 DIAGNOSIS — E559 Vitamin D deficiency, unspecified: Secondary | ICD-10-CM | POA: Diagnosis not present

## 2016-03-19 DIAGNOSIS — F419 Anxiety disorder, unspecified: Secondary | ICD-10-CM | POA: Diagnosis not present

## 2016-03-19 DIAGNOSIS — Z7189 Other specified counseling: Secondary | ICD-10-CM | POA: Diagnosis not present

## 2016-03-19 DIAGNOSIS — G43009 Migraine without aura, not intractable, without status migrainosus: Secondary | ICD-10-CM | POA: Diagnosis not present

## 2016-03-19 DIAGNOSIS — F329 Major depressive disorder, single episode, unspecified: Secondary | ICD-10-CM | POA: Diagnosis not present

## 2016-03-19 DIAGNOSIS — Z1239 Encounter for other screening for malignant neoplasm of breast: Secondary | ICD-10-CM | POA: Diagnosis not present

## 2016-05-19 ENCOUNTER — Ambulatory Visit: Payer: Commercial Managed Care - HMO

## 2016-08-22 DIAGNOSIS — R05 Cough: Secondary | ICD-10-CM | POA: Diagnosis not present

## 2016-08-22 DIAGNOSIS — J301 Allergic rhinitis due to pollen: Secondary | ICD-10-CM | POA: Diagnosis not present

## 2016-08-29 DIAGNOSIS — K5900 Constipation, unspecified: Secondary | ICD-10-CM | POA: Diagnosis not present

## 2016-08-29 DIAGNOSIS — F419 Anxiety disorder, unspecified: Secondary | ICD-10-CM | POA: Diagnosis not present

## 2016-09-22 DIAGNOSIS — R634 Abnormal weight loss: Secondary | ICD-10-CM | POA: Diagnosis not present

## 2016-09-22 DIAGNOSIS — I2510 Atherosclerotic heart disease of native coronary artery without angina pectoris: Secondary | ICD-10-CM | POA: Diagnosis not present

## 2016-09-22 DIAGNOSIS — I10 Essential (primary) hypertension: Secondary | ICD-10-CM | POA: Diagnosis not present

## 2016-09-22 DIAGNOSIS — G43009 Migraine without aura, not intractable, without status migrainosus: Secondary | ICD-10-CM | POA: Diagnosis not present

## 2016-09-22 DIAGNOSIS — F329 Major depressive disorder, single episode, unspecified: Secondary | ICD-10-CM | POA: Diagnosis not present

## 2016-09-22 DIAGNOSIS — J301 Allergic rhinitis due to pollen: Secondary | ICD-10-CM | POA: Diagnosis not present

## 2016-09-22 DIAGNOSIS — R05 Cough: Secondary | ICD-10-CM | POA: Diagnosis not present

## 2016-09-22 DIAGNOSIS — K219 Gastro-esophageal reflux disease without esophagitis: Secondary | ICD-10-CM | POA: Diagnosis not present

## 2016-09-22 DIAGNOSIS — F419 Anxiety disorder, unspecified: Secondary | ICD-10-CM | POA: Diagnosis not present

## 2016-12-11 DIAGNOSIS — Z0100 Encounter for examination of eyes and vision without abnormal findings: Secondary | ICD-10-CM | POA: Diagnosis not present

## 2017-03-31 DIAGNOSIS — Z0000 Encounter for general adult medical examination without abnormal findings: Secondary | ICD-10-CM | POA: Diagnosis not present

## 2017-03-31 DIAGNOSIS — K219 Gastro-esophageal reflux disease without esophagitis: Secondary | ICD-10-CM | POA: Diagnosis not present

## 2017-03-31 DIAGNOSIS — I2510 Atherosclerotic heart disease of native coronary artery without angina pectoris: Secondary | ICD-10-CM | POA: Diagnosis not present

## 2017-03-31 DIAGNOSIS — Z23 Encounter for immunization: Secondary | ICD-10-CM | POA: Diagnosis not present

## 2017-03-31 DIAGNOSIS — E7800 Pure hypercholesterolemia, unspecified: Secondary | ICD-10-CM | POA: Diagnosis not present

## 2017-03-31 DIAGNOSIS — Z1389 Encounter for screening for other disorder: Secondary | ICD-10-CM | POA: Diagnosis not present

## 2017-03-31 DIAGNOSIS — E559 Vitamin D deficiency, unspecified: Secondary | ICD-10-CM | POA: Diagnosis not present

## 2017-03-31 DIAGNOSIS — F324 Major depressive disorder, single episode, in partial remission: Secondary | ICD-10-CM | POA: Diagnosis not present

## 2017-03-31 DIAGNOSIS — Z79899 Other long term (current) drug therapy: Secondary | ICD-10-CM | POA: Diagnosis not present

## 2017-03-31 DIAGNOSIS — I10 Essential (primary) hypertension: Secondary | ICD-10-CM | POA: Diagnosis not present

## 2017-07-19 ENCOUNTER — Emergency Department (HOSPITAL_COMMUNITY)
Admission: EM | Admit: 2017-07-19 | Discharge: 2017-07-20 | Disposition: A | Discharge status: Home / Self Care | Payer: Medicare HMO | Attending: Emergency Medicine | Admitting: Emergency Medicine

## 2017-07-19 ENCOUNTER — Encounter (HOSPITAL_COMMUNITY): Payer: Self-pay | Admitting: Emergency Medicine

## 2017-07-19 ENCOUNTER — Other Ambulatory Visit: Payer: Self-pay

## 2017-07-19 DIAGNOSIS — Z87891 Personal history of nicotine dependence: Secondary | ICD-10-CM | POA: Diagnosis not present

## 2017-07-19 DIAGNOSIS — R197 Diarrhea, unspecified: Secondary | ICD-10-CM | POA: Diagnosis not present

## 2017-07-19 DIAGNOSIS — R110 Nausea: Secondary | ICD-10-CM | POA: Insufficient documentation

## 2017-07-19 DIAGNOSIS — R1031 Right lower quadrant pain: Secondary | ICD-10-CM | POA: Insufficient documentation

## 2017-07-19 DIAGNOSIS — R3911 Hesitancy of micturition: Secondary | ICD-10-CM | POA: Diagnosis not present

## 2017-07-19 DIAGNOSIS — Z79899 Other long term (current) drug therapy: Secondary | ICD-10-CM | POA: Diagnosis not present

## 2017-07-19 DIAGNOSIS — I10 Essential (primary) hypertension: Secondary | ICD-10-CM | POA: Insufficient documentation

## 2017-07-19 LAB — COMPREHENSIVE METABOLIC PANEL
ALBUMIN: 3.9 g/dL (ref 3.5–5.0)
ALK PHOS: 63 U/L (ref 38–126)
ALT: 16 U/L (ref 14–54)
ANION GAP: 10 (ref 5–15)
AST: 27 U/L (ref 15–41)
BILIRUBIN TOTAL: 0.8 mg/dL (ref 0.3–1.2)
BUN: 16 mg/dL (ref 6–20)
CALCIUM: 9.2 mg/dL (ref 8.9–10.3)
CO2: 18 mmol/L — AB (ref 22–32)
CREATININE: 1.1 mg/dL — AB (ref 0.44–1.00)
Chloride: 109 mmol/L (ref 101–111)
GFR calc non Af Amer: 49 mL/min — ABNORMAL LOW (ref >60)
GFR, EST AFRICAN AMERICAN: 57 mL/min — AB (ref >60)
GLUCOSE: 96 mg/dL (ref 65–99)
Potassium: 3.7 mmol/L (ref 3.5–5.1)
SODIUM: 137 mmol/L (ref 135–145)
TOTAL PROTEIN: 6.6 g/dL (ref 6.5–8.1)

## 2017-07-19 LAB — CBC
HCT: 37.3 % (ref 36.0–46.0)
Hemoglobin: 12.7 g/dL (ref 12.0–15.0)
MCH: 33.2 pg (ref 26.0–34.0)
MCHC: 34 g/dL (ref 30.0–36.0)
MCV: 97.6 fL (ref 78.0–100.0)
PLATELETS: 151 10*3/uL (ref 150–400)
RBC: 3.82 MIL/uL — ABNORMAL LOW (ref 3.87–5.11)
RDW: 12.8 % (ref 11.5–15.5)
WBC: 5.6 10*3/uL (ref 4.0–10.5)

## 2017-07-19 LAB — MAGNESIUM: MAGNESIUM: 1.9 mg/dL (ref 1.7–2.4)

## 2017-07-19 LAB — LIPASE, BLOOD: Lipase: 37 U/L (ref 11–51)

## 2017-07-19 MED ORDER — SODIUM CHLORIDE 0.9 % IV BOLUS (SEPSIS)
1000.0000 mL | Freq: Once | INTRAVENOUS | Status: AC
  Administered 2017-07-19: 1000 mL via INTRAVENOUS

## 2017-07-19 MED ORDER — ONDANSETRON HCL 4 MG/2ML IJ SOLN
4.0000 mg | Freq: Once | INTRAMUSCULAR | Status: AC
  Administered 2017-07-19: 4 mg via INTRAVENOUS
  Filled 2017-07-19: qty 2

## 2017-07-19 NOTE — ED Notes (Signed)
ED Provider at bedside. 

## 2017-07-19 NOTE — ED Notes (Signed)
Pt has been told that we need to collect a urine sample.

## 2017-07-19 NOTE — ED Triage Notes (Signed)
C/o nausea, diarrhea, and lower abd pain x 1 week.  Diarrhea x 6 in last 24 hours.  Denies vomiting.

## 2017-07-19 NOTE — ED Provider Notes (Signed)
[facility]   CSN: [id number] Arrival date & time: 07/19/17  1810     History   Chief Complaint Chief Complaint  Patient presents with  . Diarrhea  . Abdominal Pain    HPI Patricia Stanley is a 72 y.o. female.  Patient with a history of HTN presents with recurrent diarrhea x 1 week. She reports up to 6 episodes daily of nonbloody, non-melanic stools. She experiences nausea but no vomiting. Symptoms are related to any PO intake. Symptoms have been recurrent for the past one year. She was seen and evaluated by GI (Dr. Madilyn FiremanHayes) one year ago and reports negative EGD/colonoscopy but did not return for follow up. No fever, cough, chest pain. She states she has urinary hesitancy but no dysuria, hematuria or frequency.    The history is provided by the patient and the spouse. No language interpreter was used.    Past Medical History:  Diagnosis Date  . Closed fracture of coccyx (HCC) 2015   AFTER FALL  . Depression   . GERD (gastroesophageal reflux disease)   . Headache    MIGRAINES OCCASIONAL  . History of kidney stones    HAS NOW  . Hypertension     Patient Active Problem List   Diagnosis Date Noted  . Major depression in partial remission (HCC) 08/25/2012  . Osteoporosis, unspecified 08/25/2012  . Essential hypertension, benign 08/25/2012  . Fall at home 08/24/2012  . Pelvic fracture (HCC) 08/24/2012  . Migraines 08/24/2012  . Renal insufficiency, mild 08/24/2012    Past Surgical History:  Procedure Laterality Date  . ABDOMINAL HYSTERECTOMY     1 OVARY LEFT  . APPENDECTOMY  AGE 12   DONE WITH OVARY REMOVAL  . COLONOSCOPY WITH PROPOFOL N/A 12/03/2015   Procedure: COLONOSCOPY WITH PROPOFOL;  Surgeon: Charolett BumpersMartin K Johnson, MD;  Location: [facility] ENDOSCOPY;  Service: Endoscopy;  Laterality: N/A;  . ESOPHAGOGASTRODUODENOSCOPY (EGD) WITH PROPOFOL N/A 12/03/2015   Procedure: ESOPHAGOGASTRODUODENOSCOPY (EGD) WITH PROPOFOL;   Surgeon: Charolett BumpersMartin K Johnson, MD;  Location: [facility] ENDOSCOPY;  Service: Endoscopy;  Laterality: N/A;  . TONSILLECTOMY  AGE 28   AND ADENOIDS REMOVED    OB History    No data available       Home Medications    Prior to Admission medications   Medication Sig Start Date End Date Taking? Authorizing Provider  atenolol (TENORMIN) 50 MG tablet Take 50 mg by mouth daily.    [provider]  clonazePAM (KLONOPIN) 1 MG tablet Take 1 tablet (1 mg total) by mouth 2 (two) times daily. Patient taking differently: Take 1-2 mg by mouth 2 (two) times daily. Take 1 tablet in the morning, and 2 tablets at bedtime 08/28/12   Marden NobleGates, Robert, MD  cloNIDine (CATAPRES) 0.3 MG tablet Take 0.3 mg by mouth daily.    [provider]  FLUoxetine (PROZAC) 40 MG capsule Take 40 mg by mouth daily. 09/07/15   [provider]  pantoprazole (PROTONIX) 40 MG tablet Take 40 mg by mouth daily. 08/20/15   [provider]  simvastatin (ZOCOR) 40 MG tablet Take 40 mg by mouth every evening. 09/27/15   [provider]  topiramate (TOPAMAX) 100 MG tablet Take 150 mg by mouth at bedtime. For headaches    [provider]  traMADol (ULTRAM) 50 MG tablet Take 50 mg by mouth every 8 (eight) hours as needed for pain.     [provider]    Family History No  family history on file.  Social History Social History   Tobacco Use  . Smoking status: Former Smoker    Last attempt to quit: 05/06/1995    Years since quitting: 22.2  . Smokeless tobacco: Never Used  Substance Use Topics  . Alcohol use: No  . Drug use: No     Allergies   Paroxetine   Review of Systems Review of Systems  Constitutional: Negative for chills and fever.  Respiratory: Negative.  Negative for cough and shortness of breath.   Cardiovascular: Negative.  Negative for chest pain.  Gastrointestinal: Positive for abdominal pain, diarrhea and nausea. Negative for abdominal distention and blood in stool.    Genitourinary: Positive for difficulty urinating. Negative for dysuria, flank pain, frequency and hematuria.  Musculoskeletal: Negative.  Negative for myalgias.  Skin: Negative.  Negative for pallor.  Neurological: Positive for light-headedness. Negative for syncope.     Physical Exam Updated Vital Signs BP (!) 145/65   Pulse 62   Temp 98 F (36.7 C) (Oral)   Resp 16   Ht 4\' 10"  (1.473 m)   Wt 59 kg (130 lb)   SpO2 99%   BMI 27.17 kg/m   Physical Exam  Constitutional: She is oriented to person, place, and time. She appears well-developed and well-nourished.  HENT:  Head: Normocephalic.  Mouth/Throat: Mucous membranes are dry.  Neck: Normal range of motion. Neck supple.  Cardiovascular: Normal rate and regular rhythm.  Pulmonary/Chest: Effort normal and breath sounds normal.  Abdominal: Soft. Bowel sounds are normal. She exhibits no distension. There is tenderness in the right lower quadrant and left lower quadrant. There is no rebound and no guarding.  Musculoskeletal: Normal range of motion.  Neurological: She is alert and oriented to person, place, and time.  Skin: Skin is warm and dry. No rash noted.  Psychiatric: She has a normal mood and affect.     ED Treatments / Results  Labs (all labs ordered are listed, but only abnormal results are displayed) Labs Reviewed  COMPREHENSIVE METABOLIC PANEL - Abnormal; Notable for the following components:      Result Value   CO2 18 (*)    Creatinine, Ser 1.10 (*)    GFR calc non Af Amer 49 (*)    GFR calc Af Amer 57 (*)    All other components within normal limits  CBC - Abnormal; Notable for the following components:   RBC 3.82 (*)    All other components within normal limits  LIPASE, BLOOD  URINALYSIS, ROUTINE W REFLEX MICROSCOPIC  MAGNESIUM    EKG  EKG Interpretation None       Radiology No results found.  Procedures Procedures (including critical care time)  Medications Ordered in ED Medications   ondansetron (ZOFRAN) injection 4 mg (not administered)  sodium chloride 0.9 % bolus 1,000 mL (1,000 mLs Intravenous New Bag/Given 07/19/17 2249)     Initial Impression / Assessment and Plan / ED Course  I have reviewed the triage vital signs and the nursing notes.  Pertinent labs & imaging results that were available during my care of the patient were reviewed by me and considered in my medical decision making (see chart for details).     Patient presents with recurrent diarrhea, nausea without vomiting. This episode x 1 week, recurrent episodes x 1 year. Lost to GI follow up. PCP is Dr. Kevan Ny  She appears nontoxic here. She feels better after IVF's. No vomiting. She had one bout of loose stool here. Labs  are reassuring. No evidence infection. Abdominal exam is benign. CT is not felt necessary.   She can be discharged home with guidelines for Imodium use. Will treat nausea with Zofran. All questions of patient and family addressed.   Final Clinical Impressions(s) / ED Diagnoses   Final diagnoses:  None   1. Diarrhea, recurrent  ED Discharge Orders    None       Danne Harbor 07/20/17 0121    Blane Ohara, MD 07/21/17 [phone]

## 2017-07-20 LAB — URINALYSIS, ROUTINE W REFLEX MICROSCOPIC
BACTERIA UA: NONE SEEN
Bilirubin Urine: NEGATIVE
GLUCOSE, UA: NEGATIVE mg/dL
KETONES UR: 20 mg/dL — AB
Nitrite: NEGATIVE
PROTEIN: NEGATIVE mg/dL
Specific Gravity, Urine: 1.013 (ref 1.005–1.030)
pH: 6 (ref 5.0–8.0)

## 2017-07-20 MED ORDER — ONDANSETRON HCL 4 MG PO TABS: 4.0000 mg | ORAL_TABLET | Freq: Four times a day (QID) | ORAL | 0 refills | Status: DC

## 2017-07-20 NOTE — ED Notes (Signed)
ED Provider at bedside. 

## 2017-07-20 NOTE — Discharge Instructions (Signed)
Recommend Imodium if having more than 5 bowel movements daily. It is important for you to follow up with gastroenterology for further evaluation. Return to the emergency department with any severe pain, high fever, bloody bowel movements or uncontrolled vomiting.

## 2017-10-23 DIAGNOSIS — F419 Anxiety disorder, unspecified: Secondary | ICD-10-CM | POA: Diagnosis not present

## 2017-10-23 DIAGNOSIS — K649 Unspecified hemorrhoids: Secondary | ICD-10-CM | POA: Diagnosis not present

## 2017-10-23 DIAGNOSIS — K5900 Constipation, unspecified: Secondary | ICD-10-CM | POA: Diagnosis not present

## 2017-12-23 DIAGNOSIS — E782 Mixed hyperlipidemia: Secondary | ICD-10-CM | POA: Diagnosis not present

## 2017-12-23 DIAGNOSIS — Z1389 Encounter for screening for other disorder: Secondary | ICD-10-CM | POA: Diagnosis not present

## 2017-12-23 DIAGNOSIS — G4700 Insomnia, unspecified: Secondary | ICD-10-CM | POA: Diagnosis not present

## 2017-12-23 DIAGNOSIS — I10 Essential (primary) hypertension: Secondary | ICD-10-CM | POA: Diagnosis not present

## 2017-12-23 DIAGNOSIS — E559 Vitamin D deficiency, unspecified: Secondary | ICD-10-CM | POA: Diagnosis not present

## 2017-12-23 DIAGNOSIS — I2510 Atherosclerotic heart disease of native coronary artery without angina pectoris: Secondary | ICD-10-CM | POA: Diagnosis not present

## 2017-12-23 DIAGNOSIS — F419 Anxiety disorder, unspecified: Secondary | ICD-10-CM | POA: Diagnosis not present

## 2017-12-23 DIAGNOSIS — Z79899 Other long term (current) drug therapy: Secondary | ICD-10-CM | POA: Diagnosis not present

## 2017-12-23 DIAGNOSIS — K219 Gastro-esophageal reflux disease without esophagitis: Secondary | ICD-10-CM | POA: Diagnosis not present

## 2017-12-23 DIAGNOSIS — Z0000 Encounter for general adult medical examination without abnormal findings: Secondary | ICD-10-CM | POA: Diagnosis not present

## 2017-12-23 DIAGNOSIS — F324 Major depressive disorder, single episode, in partial remission: Secondary | ICD-10-CM | POA: Diagnosis not present

## 2018-07-14 DIAGNOSIS — K219 Gastro-esophageal reflux disease without esophagitis: Secondary | ICD-10-CM | POA: Diagnosis not present

## 2019-04-18 ENCOUNTER — Emergency Department (HOSPITAL_COMMUNITY): Payer: Medicare HMO

## 2019-04-18 ENCOUNTER — Other Ambulatory Visit: Payer: Self-pay

## 2019-04-18 ENCOUNTER — Encounter (HOSPITAL_COMMUNITY): Payer: Self-pay

## 2019-04-18 ENCOUNTER — Inpatient Hospital Stay (HOSPITAL_COMMUNITY)
Admission: EM | Admit: 2019-04-18 | Discharge: 2019-04-25 | DRG: 038 | Disposition: A | Discharge status: Rehabilitation Facility | Payer: Medicare HMO | Attending: Internal Medicine | Admitting: Internal Medicine

## 2019-04-18 DIAGNOSIS — I952 Hypotension due to drugs: Secondary | ICD-10-CM | POA: Diagnosis not present

## 2019-04-18 DIAGNOSIS — E785 Hyperlipidemia, unspecified: Secondary | ICD-10-CM | POA: Diagnosis present

## 2019-04-18 DIAGNOSIS — I6521 Occlusion and stenosis of right carotid artery: Secondary | ICD-10-CM | POA: Diagnosis present

## 2019-04-18 DIAGNOSIS — I10 Essential (primary) hypertension: Secondary | ICD-10-CM | POA: Diagnosis not present

## 2019-04-18 DIAGNOSIS — Y92239 Unspecified place in hospital as the place of occurrence of the external cause: Secondary | ICD-10-CM | POA: Diagnosis not present

## 2019-04-18 DIAGNOSIS — I63233 Cerebral infarction due to unspecified occlusion or stenosis of bilateral carotid arteries: Secondary | ICD-10-CM | POA: Diagnosis not present

## 2019-04-18 DIAGNOSIS — Z87442 Personal history of urinary calculi: Secondary | ICD-10-CM | POA: Diagnosis not present

## 2019-04-18 DIAGNOSIS — F419 Anxiety disorder, unspecified: Secondary | ICD-10-CM

## 2019-04-18 DIAGNOSIS — J984 Other disorders of lung: Secondary | ICD-10-CM | POA: Diagnosis not present

## 2019-04-18 DIAGNOSIS — F329 Major depressive disorder, single episode, unspecified: Secondary | ICD-10-CM | POA: Diagnosis present

## 2019-04-18 DIAGNOSIS — Z23 Encounter for immunization: Secondary | ICD-10-CM

## 2019-04-18 DIAGNOSIS — I69320 Aphasia following cerebral infarction: Secondary | ICD-10-CM | POA: Diagnosis not present

## 2019-04-18 DIAGNOSIS — R29810 Facial weakness: Secondary | ICD-10-CM | POA: Diagnosis present

## 2019-04-18 DIAGNOSIS — F331 Major depressive disorder, recurrent, moderate: Secondary | ICD-10-CM | POA: Diagnosis not present

## 2019-04-18 DIAGNOSIS — Z20828 Contact with and (suspected) exposure to other viral communicable diseases: Secondary | ICD-10-CM | POA: Diagnosis not present

## 2019-04-18 DIAGNOSIS — D62 Acute posthemorrhagic anemia: Secondary | ICD-10-CM | POA: Diagnosis not present

## 2019-04-18 DIAGNOSIS — R4701 Aphasia: Secondary | ICD-10-CM | POA: Diagnosis present

## 2019-04-18 DIAGNOSIS — K228 Other specified diseases of esophagus: Secondary | ICD-10-CM | POA: Diagnosis not present

## 2019-04-18 DIAGNOSIS — D696 Thrombocytopenia, unspecified: Secondary | ICD-10-CM | POA: Diagnosis present

## 2019-04-18 DIAGNOSIS — Z9889 Other specified postprocedural states: Secondary | ICD-10-CM | POA: Diagnosis not present

## 2019-04-18 DIAGNOSIS — K8689 Other specified diseases of pancreas: Secondary | ICD-10-CM | POA: Diagnosis not present

## 2019-04-18 DIAGNOSIS — K219 Gastro-esophageal reflux disease without esophagitis: Secondary | ICD-10-CM | POA: Diagnosis present

## 2019-04-18 DIAGNOSIS — E7800 Pure hypercholesterolemia, unspecified: Secondary | ICD-10-CM | POA: Diagnosis present

## 2019-04-18 DIAGNOSIS — K838 Other specified diseases of biliary tract: Secondary | ICD-10-CM | POA: Diagnosis not present

## 2019-04-18 DIAGNOSIS — Z681 Body mass index (BMI) 19 or less, adult: Secondary | ICD-10-CM | POA: Diagnosis not present

## 2019-04-18 DIAGNOSIS — Z9049 Acquired absence of other specified parts of digestive tract: Secondary | ICD-10-CM

## 2019-04-18 DIAGNOSIS — R4781 Slurred speech: Secondary | ICD-10-CM | POA: Diagnosis not present

## 2019-04-18 DIAGNOSIS — G43909 Migraine, unspecified, not intractable, without status migrainosus: Secondary | ICD-10-CM | POA: Diagnosis present

## 2019-04-18 DIAGNOSIS — Z79891 Long term (current) use of opiate analgesic: Secondary | ICD-10-CM

## 2019-04-18 DIAGNOSIS — G4700 Insomnia, unspecified: Secondary | ICD-10-CM | POA: Diagnosis present

## 2019-04-18 DIAGNOSIS — R634 Abnormal weight loss: Secondary | ICD-10-CM | POA: Diagnosis not present

## 2019-04-18 DIAGNOSIS — D72819 Decreased white blood cell count, unspecified: Secondary | ICD-10-CM | POA: Diagnosis present

## 2019-04-18 DIAGNOSIS — Z87891 Personal history of nicotine dependence: Secondary | ICD-10-CM | POA: Diagnosis not present

## 2019-04-18 DIAGNOSIS — J982 Interstitial emphysema: Secondary | ICD-10-CM | POA: Diagnosis not present

## 2019-04-18 DIAGNOSIS — I639 Cerebral infarction, unspecified: Secondary | ICD-10-CM | POA: Diagnosis not present

## 2019-04-18 DIAGNOSIS — K229 Disease of esophagus, unspecified: Secondary | ICD-10-CM | POA: Diagnosis present

## 2019-04-18 DIAGNOSIS — Z888 Allergy status to other drugs, medicaments and biological substances status: Secondary | ICD-10-CM

## 2019-04-18 DIAGNOSIS — G8194 Hemiplegia, unspecified affecting left nondominant side: Secondary | ICD-10-CM | POA: Diagnosis not present

## 2019-04-18 DIAGNOSIS — Z79899 Other long term (current) drug therapy: Secondary | ICD-10-CM

## 2019-04-18 DIAGNOSIS — N200 Calculus of kidney: Secondary | ICD-10-CM | POA: Diagnosis not present

## 2019-04-18 DIAGNOSIS — R933 Abnormal findings on diagnostic imaging of other parts of digestive tract: Secondary | ICD-10-CM | POA: Diagnosis not present

## 2019-04-18 DIAGNOSIS — R29818 Other symptoms and signs involving the nervous system: Secondary | ICD-10-CM | POA: Diagnosis not present

## 2019-04-18 DIAGNOSIS — J479 Bronchiectasis, uncomplicated: Secondary | ICD-10-CM | POA: Diagnosis not present

## 2019-04-18 DIAGNOSIS — T465X5A Adverse effect of other antihypertensive drugs, initial encounter: Secondary | ICD-10-CM | POA: Diagnosis not present

## 2019-04-18 DIAGNOSIS — R29706 NIHSS score 6: Secondary | ICD-10-CM | POA: Diagnosis present

## 2019-04-18 DIAGNOSIS — Z90710 Acquired absence of both cervix and uterus: Secondary | ICD-10-CM | POA: Diagnosis not present

## 2019-04-18 DIAGNOSIS — K580 Irritable bowel syndrome with diarrhea: Secondary | ICD-10-CM | POA: Diagnosis present

## 2019-04-18 DIAGNOSIS — I69392 Facial weakness following cerebral infarction: Secondary | ICD-10-CM | POA: Diagnosis not present

## 2019-04-18 DIAGNOSIS — I69354 Hemiplegia and hemiparesis following cerebral infarction affecting left non-dominant side: Secondary | ICD-10-CM | POA: Diagnosis not present

## 2019-04-18 DIAGNOSIS — I6389 Other cerebral infarction: Principal | ICD-10-CM | POA: Diagnosis present

## 2019-04-18 DIAGNOSIS — G43709 Chronic migraine without aura, not intractable, without status migrainosus: Secondary | ICD-10-CM | POA: Diagnosis not present

## 2019-04-18 DIAGNOSIS — I700 Atherosclerosis of aorta: Secondary | ICD-10-CM | POA: Diagnosis not present

## 2019-04-18 DIAGNOSIS — R531 Weakness: Secondary | ICD-10-CM | POA: Diagnosis not present

## 2019-04-18 LAB — DIFFERENTIAL
Abs Immature Granulocytes: 0 10*3/uL (ref 0.00–0.07)
Basophils Absolute: 0.1 10*3/uL (ref 0.0–0.1)
Basophils Relative: 1 %
Eosinophils Absolute: 0.3 10*3/uL (ref 0.0–0.5)
Eosinophils Relative: 8 %
Immature Granulocytes: 0 %
Lymphocytes Relative: 30 %
Lymphs Abs: 1.1 10*3/uL (ref 0.7–4.0)
Monocytes Absolute: 0.3 10*3/uL (ref 0.1–1.0)
Monocytes Relative: 9 %
Neutro Abs: 1.9 10*3/uL (ref 1.7–7.7)
Neutrophils Relative %: 52 %

## 2019-04-18 LAB — COMPREHENSIVE METABOLIC PANEL
ALT: 13 U/L (ref 0–44)
AST: 21 U/L (ref 15–41)
Albumin: 3.8 g/dL (ref 3.5–5.0)
Alkaline Phosphatase: 59 U/L (ref 38–126)
Anion gap: 8 (ref 5–15)
BUN: 15 mg/dL (ref 8–23)
CO2: 22 mmol/L (ref 22–32)
Calcium: 8.9 mg/dL (ref 8.9–10.3)
Chloride: 114 mmol/L — ABNORMAL HIGH (ref 98–111)
Creatinine, Ser: 1.01 mg/dL — ABNORMAL HIGH (ref 0.44–1.00)
GFR calc Af Amer: >60 mL/min (ref >60)
GFR calc non Af Amer: 56 mL/min — ABNORMAL LOW (ref >60)
Glucose, Bld: 88 mg/dL (ref 70–99)
Potassium: 3.5 mmol/L (ref 3.5–5.1)
Sodium: 144 mmol/L (ref 135–145)
Total Bilirubin: 0.5 mg/dL (ref 0.3–1.2)
Total Protein: 6.8 g/dL (ref 6.5–8.1)

## 2019-04-18 LAB — URINALYSIS, ROUTINE W REFLEX MICROSCOPIC
Bilirubin Urine: NEGATIVE
Glucose, UA: NEGATIVE mg/dL
Hgb urine dipstick: NEGATIVE
Ketones, ur: 5 mg/dL — AB
Leukocytes,Ua: NEGATIVE
Nitrite: NEGATIVE
Protein, ur: NEGATIVE mg/dL
Specific Gravity, Urine: 1.013 (ref 1.005–1.030)
pH: 7 (ref 5.0–8.0)

## 2019-04-18 LAB — ETHANOL: Alcohol, Ethyl (B): <10 mg/dL (ref <10)

## 2019-04-18 LAB — CBC
HCT: 38.8 % (ref 36.0–46.0)
Hemoglobin: 12.6 g/dL (ref 12.0–15.0)
MCH: 31.1 pg (ref 26.0–34.0)
MCHC: 32.5 g/dL (ref 30.0–36.0)
MCV: 95.8 fL (ref 80.0–100.0)
Platelets: 120 10*3/uL — ABNORMAL LOW (ref 150–400)
RBC: 4.05 MIL/uL (ref 3.87–5.11)
RDW: 12.6 % (ref 11.5–15.5)
WBC: 3.8 10*3/uL — ABNORMAL LOW (ref 4.0–10.5)
nRBC: 0 % (ref 0.0–0.2)

## 2019-04-18 LAB — PROTIME-INR
INR: 1 (ref 0.8–1.2)
Prothrombin Time: 13.3 seconds (ref 11.4–15.2)

## 2019-04-18 LAB — APTT: aPTT: 28 seconds (ref 24–36)

## 2019-04-18 MED ORDER — ASPIRIN 325 MG PO TABS
325.0000 mg | ORAL_TABLET | Freq: Every day | ORAL | Status: DC
  Administered 2019-04-19 – 2019-04-25 (×7): 325 mg via ORAL
  Filled 2019-04-18 (×7): qty 1

## 2019-04-18 MED ORDER — HYDRALAZINE HCL 20 MG/ML IJ SOLN
5.0000 mg | INTRAMUSCULAR | Status: DC | PRN
  Administered 2019-04-21: 5 mg via INTRAVENOUS

## 2019-04-18 MED ORDER — SODIUM CHLORIDE 0.9 % IV BOLUS
1000.0000 mL | Freq: Once | INTRAVENOUS | Status: AC
  Administered 2019-04-18: 18:00:00 1000 mL via INTRAVENOUS

## 2019-04-18 MED ORDER — STROKE: EARLY STAGES OF RECOVERY BOOK
Freq: Once | Status: AC
  Filled 2019-04-18: qty 1

## 2019-04-18 MED ORDER — ACETAMINOPHEN 325 MG PO TABS
650.0000 mg | ORAL_TABLET | ORAL | Status: DC | PRN
  Administered 2019-04-20 – 2019-04-24 (×4): 650 mg via ORAL
  Filled 2019-04-18 (×4): qty 2

## 2019-04-18 MED ORDER — CLONAZEPAM 1 MG PO TABS
1.0000 mg | ORAL_TABLET | Freq: Every morning | ORAL | Status: DC
  Administered 2019-04-19 – 2019-04-25 (×6): 1 mg via ORAL
  Filled 2019-04-18 (×3): qty 1
  Filled 2019-04-18: qty 2
  Filled 2019-04-18: qty 1
  Filled 2019-04-18: qty 2

## 2019-04-18 MED ORDER — ENOXAPARIN SODIUM 30 MG/0.3ML ~~LOC~~ SOLN
30.0000 mg | SUBCUTANEOUS | Status: DC
  Administered 2019-04-19 – 2019-04-20 (×3): 30 mg via SUBCUTANEOUS
  Filled 2019-04-18 (×3): qty 0.3

## 2019-04-18 MED ORDER — CLONIDINE HCL 0.3 MG PO TABS
0.3000 mg | ORAL_TABLET | Freq: Every day | ORAL | Status: DC
  Administered 2019-04-19: 0.3 mg via ORAL
  Filled 2019-04-18 (×2): qty 1

## 2019-04-18 MED ORDER — ASPIRIN 300 MG RE SUPP
300.0000 mg | Freq: Every day | RECTAL | Status: DC
  Filled 2019-04-18: qty 1

## 2019-04-18 MED ORDER — ACETAMINOPHEN 160 MG/5ML PO SOLN: 650.0000 mg | ORAL | Status: DC | PRN

## 2019-04-18 MED ORDER — CLONAZEPAM 1 MG PO TABS
2.0000 mg | ORAL_TABLET | Freq: Every day | ORAL | Status: DC
  Administered 2019-04-19 – 2019-04-25 (×8): 2 mg via ORAL
  Filled 2019-04-18 (×3): qty 2
  Filled 2019-04-18: qty 4
  Filled 2019-04-18: qty 2
  Filled 2019-04-18: qty 4
  Filled 2019-04-18: qty 2
  Filled 2019-04-18: qty 4

## 2019-04-18 MED ORDER — TOPIRAMATE 25 MG PO TABS
150.0000 mg | ORAL_TABLET | Freq: Every day | ORAL | Status: DC
  Administered 2019-04-19 (×2): 150 mg via ORAL
  Filled 2019-04-18 (×2): qty 2
  Filled 2019-04-18: qty 6

## 2019-04-18 MED ORDER — ATORVASTATIN CALCIUM 80 MG PO TABS
80.0000 mg | ORAL_TABLET | Freq: Every day | ORAL | Status: DC
  Administered 2019-04-19 – 2019-04-25 (×8): 80 mg via ORAL
  Filled 2019-04-18 (×8): qty 1

## 2019-04-18 MED ORDER — ACETAMINOPHEN 650 MG RE SUPP: 650.0000 mg | RECTAL | Status: DC | PRN

## 2019-04-18 MED ORDER — ATENOLOL 25 MG PO TABS
50.0000 mg | ORAL_TABLET | Freq: Every evening | ORAL | Status: DC
  Administered 2019-04-19 – 2019-04-25 (×7): 50 mg via ORAL
  Filled 2019-04-18: qty 2
  Filled 2019-04-18: qty 1
  Filled 2019-04-18 (×6): qty 2

## 2019-04-18 NOTE — ED Provider Notes (Signed)
I assumed care of patient from previous team, please see their note for full H and P.  Briefly patient is here for evaluation of left sided weakness.   Symptoms have been present for 2 days, outside any stroke window.   Physical Exam  BP (!) 173/72   Pulse 62   Temp 98.2 F (36.8 C) (Oral)   Resp 13   Ht 5\' 1"  (1.549 m)   Wt 43.1 kg   SpO2 99%   BMI 17.95 kg/m   Physical Exam Vitals and nursing note reviewed.  Constitutional:      General: She is not in acute distress.    Appearance: She is well-developed. She is not diaphoretic.  HENT:     Head: Normocephalic and atraumatic.  Eyes:     General: No scleral icterus.       Right eye: No discharge.        Left eye: No discharge.     Conjunctiva/sclera: Conjunctivae normal.  Cardiovascular:     Rate and Rhythm: Normal rate and regular rhythm.  Pulmonary:     Effort: Pulmonary effort is normal. No respiratory distress.     Breath sounds: No stridor.  Abdominal:     General: There is no distension.  Musculoskeletal:        General: No deformity.     Cervical back: Normal range of motion.  Skin:    General: Skin is warm and dry.  Neurological:     Mental Status: She is alert.     Motor: No abnormal muscle tone.     Comments: Patient is able to speak.  She is an awake and alert. Left leg and arm are weak when compared to  right.  Psychiatric:        Behavior: Behavior normal.     ED Course/Procedures   Clinical Course as of Apr 18 40  Mon Apr 18, 2019  Apr 20, 2019 Spoke with Dr. [id number] who will see patient.    [EH]    Clinical Course User Index [EH] Loney Loh, PA-C    Procedures CT HEAD WO CONTRAST  Result Date: 04/18/2019 CLINICAL DATA:  Focal neural deficit for more than 6 hours suspected stroke, history hypertension, former smoker EXAM: CT HEAD WITHOUT CONTRAST TECHNIQUE: Contiguous axial images were obtained from the base of the skull through the vertex without intravenous contrast. Sagittal and  coronal MPR images reconstructed from axial data set. COMPARISON:  08/24/2012 FINDINGS: Brain: Generalized atrophy. Normal ventricular morphology. No midline shift or mass effect. Small vessel chronic ischemic changes of deep cerebral white matter. Old BILATERAL caudate lacunar infarcts. Old BILATERAL basal ganglia lacunar infarcts with extension on LEFT into periventricular white matter in the LEFT frontal lobe. No intracranial hemorrhage, mass lesion or evidence of acute infarction. No extra-axial fluid collections. Vascular: Atherosclerotic calcification of internal carotid arteries at skull base Skull: Intact Sinuses/Orbits: Clear Other: N/A IMPRESSION: Atrophy with small vessel chronic ischemic changes of deep cerebral white matter. Old lacunar infarcts as above. No acute intracranial abnormalities. Electronically Signed   By: 08/26/2012 M.D.   On: 04/18/2019 14:40   MR BRAIN WO CONTRAST  Result Date: 04/18/2019 CLINICAL DATA:  Suspected stroke, left-sided weakness. EXAM: MRI HEAD WITHOUT CONTRAST TECHNIQUE: Multiplanar, multiecho pulse sequences of the brain and surrounding structures were obtained without intravenous contrast. COMPARISON:  Head CT 04/18/2019, head CT 08/24/2012 FINDINGS: Brain: Acute infarct within the right corona radiata measuring 0.8 x 1.4 x 1.0 cm (series 3, image 34) (series  4, image 17). Corresponding T2/FLAIR hyperintensity at this site. No evidence of intracranial mass. No midline shift or extra-axial fluid collection. Redemonstrated are multiple chronic lacunar infarcts within the left front lobe periventricular white matter, as well as bilateral basal ganglia and thalami. There is a background of otherwise moderate chronic small vessel ischemic disease. There are several chronic microhemorrhages, some supratentorial as well as within the bilateral dentate nuclei. Vascular: Flow voids maintained within the proximal large arterial vessels. Skull and upper cervical spine: No  focal marrow lesion Sinuses/Orbits: Visualized orbits demonstrate no acute abnormality. Minimal ethmoid sinus mucosal thickening. Trace fluid within bilateral mastoid air cells. IMPRESSION: 1.4 cm acute infarct within the right corona radiata. Redemonstrated are multiple chronic lacunar infarcts within the left frontal lobe periventricular white matter, as well as bilateral basal ganglia and thalami. Background of otherwise moderate chronic small vessel ischemic disease. Electronically Signed   By: Kellie Simmering DO   On: 04/18/2019 17:32    MDM  Plan is to follow up on MRI.  Call back neurology after.   MRI shows acute infarct in the right corona radiata with multiple chronic lacunar infarcts.  I spoke with both Dr. Lorraine Lax and Malen Gauze about this patient.  Patient to be admitted to the hospital.   I spoke with Dr. Marlowe Sax who will admit patient.   Patient remained hemodynamically stable while in my care.        Lorin Glass, PA-C 04/19/19 0041    Lucrezia Starch, MD 04/20/19 [phone]

## 2019-04-18 NOTE — Consult Note (Signed)
Neurology Consultation  Reason for Consult: stroke Referring Physician: Dr Alvino Chapel  CC: left sided weakness  History is obtained from: Patient, husband at bedside, chart  HPI: Patricia Stanley is a 73 y.o. female past medical history of migraines, hypertension, depression, gastroesophageal reflux and ongoing weight loss with over more than a years worth of unexplainable weight loss for which she has received a GI work-up, presenting for 2 to 3 days worth of left-sided weakness and difficulty with speech. The patient says that 3 days ago, she was walking to the bathroom in the hallway when she noticed that her foot was dragging her left arm was weak.  She does not like going to the doctors and hence did not call for medical attention.  Symptoms did not improve and got a little worse than what that started.  She also had some slurred speech-husband says which might be combination of her not using her dentures as well as may be some facial weakness. She came to the hospital today, was evaluated in the ED, not a candidate for any acute intervention since her last known normal was 3 days ago. MRI brain with stroke-described below. Neurology consulted for further evaluation and recommendations. Denies any palpitations.  Denies any history of abnormal heart rhythm.  Reports hypertension and also high cholesterol per her verbal history. She is a non-smoker. Has not had any strokelike symptoms in the past. Husband reports ongoing cognitive decline over the past 1 to 2 years. 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  Lives with family at home.   LKW: 3 days ago tpa given?: no, outside the window Premorbid modified Rankin scale (mRS):1 ROS: Review of systems performed and negative except noted in the HPI.  Past Medical History:  Diagnosis Date  . Closed fracture of coccyx ([place]) 2015   AFTER FALL  . Depression   . GERD (gastroesophageal  reflux disease)   . Headache    MIGRAINES OCCASIONAL  . History of kidney stones    HAS NOW  . Hypertension     No family history on file. No family history of  Social History:   reports that she quit smoking about 23 years ago. She has never used smokeless tobacco. She reports that she does not drink alcohol or use drugs.  Medications No current facility-administered medications for this encounter.  Current Outpatient Medications:  .  atenolol (TENORMIN) 50 MG tablet, Take 50 mg by mouth daily., Disp: , Rfl:  .  clonazePAM (KLONOPIN) 1 MG tablet, Take 1 tablet (1 mg total) by mouth 2 (two) times daily. (Patient taking differently: Take 1-2 mg by mouth 2 (two) times daily. Take 1 tablet in the morning, and 2 tablets at bedtime), Disp: 30 tablet, Rfl: 1 .  cloNIDine (CATAPRES) 0.3 MG tablet, Take 0.3 mg by mouth daily., Disp: , Rfl:  .  FLUoxetine (PROZAC) 40 MG capsule, Take 40 mg by mouth daily., Disp: , Rfl:  .  ondansetron (ZOFRAN) 4 MG tablet, Take 1 tablet (4 mg total) by mouth every 6 (six) hours., Disp: 12 tablet, Rfl: 0 .  pantoprazole (PROTONIX) 40 MG tablet, Take 40 mg by mouth daily., Disp: , Rfl:  .  simvastatin (ZOCOR) 40 MG tablet, Take 40 mg by mouth every evening., Disp: , Rfl:  .  topiramate (TOPAMAX) 100 MG tablet, Take 150 mg by mouth at bedtime. For headaches, Disp: , Rfl:  .  traMADol (ULTRAM) 50 MG  tablet, Take 50 mg by mouth every 8 (eight) hours as needed for pain. , Disp: , Rfl:    Exam: Current vital signs: BP (!) 176/87   Pulse 73   Temp 98.2 F (36.8 C) (Oral)   Resp 16   Ht 5\' 1"  (1.549 m)   Wt 43.1 kg   SpO2 100%   BMI 17.95 kg/m  Vital signs in last 24 hours: Temp:  [98.2 F (36.8 C)] 98.2 F (36.8 C) (12/14 1115) Pulse Rate:  [61-78] 73 (12/14 1830) Resp:  [12-22] 16 (12/14 1830) BP: (138-181)/(72-94) 176/87 (12/14 1830) SpO2:  [97 %-100 %] 100 % (12/14 1830) Weight:  [43.1 kg] 43.1 kg (12/14 1115) GENERAL: Thin frail looking woman,  awake, alert in NAD HEENT: - Normocephalic and atraumatic, dry mm, no LN++, no Thyromegally LUNGS - Clear to auscultation bilaterally with no wheezes CV - S1S2 RRR, no m/r/g, equal pulses bilaterally. ABDOMEN - Soft, nontender, nondistended with normoactive BS Ext: warm, well perfused, intact peripheral pulses, no edema  NEURO:  Mental Status: AA&Ox3  Language: speech is dysarthric.  Naming, repetition, fluency, and comprehension intact.  Attention concentration is mildly reduced Cranial Nerves: PERRL EOMI, visual fields full, mild left facial weakness on rest but symmetric on smiling, facial sensation intact, hearing intact, tongue/uvula/soft palate midline, normal sternocleidomastoid and trapezius muscle strength. No evidence of tongue atrophy or fibrillations Motor: 3-4/5 left upper and left lower extremity.  5/5 right upper right lower extremity. Tone: is normal and bulk is normal Sensation-decreased to light touch on left.  No extinction. Coordination: FTN intact bilaterally-with the caveat that left side is weak Gait- deferred  NIHSS 1a Level of Conscious.: 0 1b LOC Questions: 0 1c LOC Commands: 0 2 Best Gaze: 0 3 Visual: 0 4 Facial Palsy: 1 5a Motor Arm - left: 1 5b Motor Arm - Right: 0 6a Motor Leg - Left: 1 6b Motor Leg - Right: 0 7 Limb Ataxia: 0 8 Sensory: 1 9 Best Language: 0 10 Dysarthria: 1 11 Extinct. and Inatten.: 0 TOTAL: 5   Labs I have reviewed labs in epic and the results pertinent to this consultation are:  CBC    Component Value Date/Time   WBC 3.8 (L) 04/18/2019 1234   RBC 4.05 04/18/2019 1234   HGB 12.6 04/18/2019 1234   HCT 38.8 04/18/2019 1234   PLT 120 (L) 04/18/2019 1234   MCV 95.8 04/18/2019 1234   MCH 31.1 04/18/2019 1234   MCHC 32.5 04/18/2019 1234   RDW 12.6 04/18/2019 1234   LYMPHSABS 1.1 04/18/2019 1234   MONOABS 0.3 04/18/2019 1234   EOSABS 0.3 04/18/2019 1234   BASOSABS 0.1 04/18/2019 1234    CMP     Component Value  Date/Time   NA 144 04/18/2019 1234   K 3.5 04/18/2019 1234   CL 114 (H) 04/18/2019 1234   CO2 22 04/18/2019 1234   GLUCOSE 88 04/18/2019 1234   BUN 15 04/18/2019 1234   CREATININE 1.01 (H) 04/18/2019 1234   CALCIUM 8.9 04/18/2019 1234   PROT 6.8 04/18/2019 1234   ALBUMIN 3.8 04/18/2019 1234   AST 21 04/18/2019 1234   ALT 13 04/18/2019 1234   ALKPHOS 59 04/18/2019 1234   BILITOT 0.5 04/18/2019 1234   GFRNONAA 56 (L) 04/18/2019 1234   GFRAA >60 04/18/2019 1234   Imaging I have reviewed the images obtained:  CT-scan of the brain-generalized atrophy.  Bilateral chronic lacunar infarcts in the caudate.  Bilateral basal ganglia lacunar infarcts.  MRI examination  of the brain 1.4 cm acute infarct in the right corona radiata.  Chronic lacunar infarcts with involvement of bilateral basal ganglia and thalami.  Moderate chronic small vessel disease.  Assessment: 73 year old with above said past medical history presenting with 3 days worth of left-sided weakness, left facial weakness and some dysarthria noted to have a 14 mm right corona radiata acute infarct-likely small vessel etiology. Patient does not follow-up with doctors and is not very compliant with medications. Has had unexplained weight loss over the past 1 year, according to the husband due to some GI issues. Needs further work-up.  Impression: Acute ischemic stroke-likely small vessel etiology  Recommendations: Aspirin 325 now and daily Atorvastatin 80 now and daily Telemetry Frequent neurochecks 2D echo CTA head and neck Hemoglobin A1c Lipid panel PT OT Speech therapy N.p.o. until cleared by swallow evaluation at the bedside or formal swallow evaluation by speech therapy. Medical work-up for weight loss and GI issues. Stroke team will continue to follow with you.  Relayed my plan to the husband at bedside.  Also relayed my plan to the ED APP, to call hospitalist for admission and medical work-up as above.  Please  call with questions.   -- Milon DikesAshish Santino Kinsella, MD [facility] Pager: [phone] If 7pm to 7am, please call on call as listed on AMION.

## 2019-04-18 NOTE — ED Triage Notes (Signed)
Pt brought from home via EMS. Family reported pt has had left sided weakness with expressive aphasia and slight confusion for 2 days. Pt denies chest pain, shortness of breath, n/v. Pt is currently A&O x4.

## 2019-04-18 NOTE — ED Provider Notes (Addendum)
[facility]   CSN: [id number] Arrival date & time: 04/18/19  1103     History Chief Complaint  Patient presents with  . Stroke Symptoms    Patricia Stanley is a 73 y.o. female.  HPI   73 year old female with a history of depression, GERD, headaches, nephrolithiasis, hypertension, who presents emergency department today for evaluation of strokelike symptoms.  Per patient and family, she has had left-sided weakness for the last 2 to 3 days.  Per family she has also had expressive aphasia and has been slightly confused.  Patient reports she has been somewhat nauseated but she has had no vomiting.  Denies chest pain, shortness of breath, headaches, vision changes.  She denies any recent falls or trauma.  She is not on any blood thinners.  Past Medical History:  Diagnosis Date  . Closed fracture of coccyx ([facility]) 2015   AFTER FALL  . Depression   . GERD (gastroesophageal reflux disease)   . Headache    MIGRAINES OCCASIONAL  . History of kidney stones    HAS NOW  . Hypertension     Patient Active Problem List   Diagnosis Date Noted  . Major depression in partial remission ([facility]) 08/25/2012  . Osteoporosis, unspecified 08/25/2012  . Essential hypertension, benign 08/25/2012  . Fall at home 08/24/2012  . Pelvic fracture ([facility]) 08/24/2012  . Migraines 08/24/2012  . Renal insufficiency, mild 08/24/2012    Past Surgical History:  Procedure Laterality Date  . ABDOMINAL HYSTERECTOMY     1 OVARY LEFT  . APPENDECTOMY  AGE 28   DONE WITH OVARY REMOVAL  . COLONOSCOPY WITH PROPOFOL N/A 12/03/2015   Procedure: COLONOSCOPY WITH PROPOFOL;  Surgeon: Charolett BumpersMartin K Johnson, MD;  Location: [facility] ENDOSCOPY;  Service: Endoscopy;  Laterality: N/A;  . ESOPHAGOGASTRODUODENOSCOPY (EGD) WITH PROPOFOL N/A 12/03/2015   Procedure: ESOPHAGOGASTRODUODENOSCOPY (EGD) WITH PROPOFOL;  Surgeon: Charolett BumpersMartin K Johnson, MD;  Location: [facility] ENDOSCOPY;  Service: Endoscopy;   Laterality: N/A;  . TONSILLECTOMY  AGE 71   AND ADENOIDS REMOVED     OB History   No obstetric history on file.     No family history on file.  Social History   Tobacco Use  . Smoking status: Former Smoker    Quit date: 05/06/1995    Years since quitting: 23.9  . Smokeless tobacco: Never Used  Substance Use Topics  . Alcohol use: No  . Drug use: No    Home Medications Prior to Admission medications   Medication Sig Start Date End Date Taking? Authorizing Provider  atenolol (TENORMIN) 50 MG tablet Take 50 mg by mouth daily.    [provider]  clonazePAM (KLONOPIN) 1 MG tablet Take 1 tablet (1 mg total) by mouth 2 (two) times daily. Patient taking differently: Take 1-2 mg by mouth 2 (two) times daily. Take 1 tablet in the morning, and 2 tablets at bedtime 08/28/12   Marden NobleGates, Robert, MD  cloNIDine (CATAPRES) 0.3 MG tablet Take 0.3 mg by mouth daily.    [provider]  FLUoxetine (PROZAC) 40 MG capsule Take 40 mg by mouth daily. 09/07/15   [provider]  ondansetron (ZOFRAN) 4 MG tablet Take 1 tablet (4 mg total) by mouth every 6 (six) hours. 07/20/17   Elpidio AnisUpstill, Shari, PA-C  pantoprazole (PROTONIX) 40 MG tablet Take 40 mg by mouth daily. 08/20/15   [provider]  simvastatin (ZOCOR) 40 MG tablet Take 40 mg by mouth every evening. 09/27/15  [provider]  topiramate (TOPAMAX) 100 MG tablet Take 150 mg by mouth at bedtime. For headaches    [provider]  traMADol (ULTRAM) 50 MG tablet Take 50 mg by mouth every 8 (eight) hours as needed for pain.     [provider]    Allergies    Paroxetine  Review of Systems   Review of Systems  Constitutional: Negative for fever.  HENT: Negative for ear pain and sore throat.   Eyes: Negative for visual disturbance.  Respiratory: Negative for cough and shortness of breath.   Cardiovascular: Negative for chest pain.  Gastrointestinal: Positive for nausea. Negative for  abdominal pain, constipation, diarrhea and vomiting.  Genitourinary: Negative for dysuria and hematuria.  Musculoskeletal: Negative for back pain.  Skin: Negative for color change and rash.  Neurological: Positive for speech difficulty, weakness and numbness. Negative for headaches.  All other systems reviewed and are negative.   Physical Exam Updated Vital Signs BP (!) 158/74   Pulse 66   Temp 98.2 F (36.8 C) (Oral)   Resp 12   Ht 5\' 1"  (1.549 m)   Wt 43.1 kg   SpO2 98%   BMI 17.95 kg/m   Physical Exam Vitals and nursing note reviewed.  Constitutional:      General: She is not in acute distress.    Appearance: She is well-developed.  HENT:     Head: Normocephalic and atraumatic.  Eyes:     Conjunctiva/sclera: Conjunctivae normal.  Cardiovascular:     Rate and Rhythm: Normal rate and regular rhythm.     Pulses: Normal pulses.     Heart sounds: Normal heart sounds. No murmur.  Pulmonary:     Effort: Pulmonary effort is normal. No respiratory distress.     Breath sounds: Normal breath sounds. No wheezing, rhonchi or rales.  Abdominal:     General: Bowel sounds are normal.     Palpations: Abdomen is soft.     Tenderness: There is no abdominal tenderness. There is no guarding or rebound.  Musculoskeletal:     Cervical back: Neck supple.  Skin:    General: Skin is warm and dry.  Neurological:     Mental Status: She is alert.     Comments: Mental Status:  Alert, thought content appropriate, able to give a coherent history. Expressive aphasia. Able to follow 2 step commands without difficulty.  Cranial Nerves:  II:  Peripheral visual fields grossly normal, pupils equal, round, reactive to light III,IV, VI: ptosis not present, extra-ocular motions intact bilaterally  V,VII: smile symmetric, sensory change to left face VIII: hearing grossly normal to voice  X: uvula elevates symmetrically  XI: bilateral shoulder shrug symmetric and strong XII: midline tongue extension  without fassiculations Motor:  Normal tone. LUE and LLE weakness Sensory: light touch normal in all extremities. DTRs: biceps and achilles 2+ symmetric b/l Cerebellar: normal finger-to-nose with bilateral upper extremities Gait: normal gait and balance. Able to walk on toes and heels with ease.  CV: 2+ radial and DP/PT pulses     ED Results / Procedures / Treatments   Labs (all labs ordered are listed, but only abnormal results are displayed) Labs Reviewed  CBC - Abnormal; Notable for the following components:      Result Value   WBC 3.8 (*)    Platelets 120 (*)    All other components within normal limits  COMPREHENSIVE METABOLIC PANEL - Abnormal; Notable for the following components:   Chloride 114 (*)  Creatinine, Ser 1.01 (*)    GFR calc non Af Amer 56 (*)    All other components within normal limits  ETHANOL  PROTIME-INR  APTT  DIFFERENTIAL  URINALYSIS, ROUTINE W REFLEX MICROSCOPIC    EKG None  Radiology CT HEAD WO CONTRAST  Result Date: 04/18/2019 CLINICAL DATA:  Focal neural deficit for more than 6 hours suspected stroke, history hypertension, former smoker EXAM: CT HEAD WITHOUT CONTRAST TECHNIQUE: Contiguous axial images were obtained from the base of the skull through the vertex without intravenous contrast. Sagittal and coronal MPR images reconstructed from axial data set. COMPARISON:  08/24/2012 FINDINGS: Brain: Generalized atrophy. Normal ventricular morphology. No midline shift or mass effect. Small vessel chronic ischemic changes of deep cerebral white matter. Old BILATERAL caudate lacunar infarcts. Old BILATERAL basal ganglia lacunar infarcts with extension on LEFT into periventricular white matter in the LEFT frontal lobe. No intracranial hemorrhage, mass lesion or evidence of acute infarction. No extra-axial fluid collections. Vascular: Atherosclerotic calcification of internal carotid arteries at skull base Skull: Intact Sinuses/Orbits: Clear Other: N/A  IMPRESSION: Atrophy with small vessel chronic ischemic changes of deep cerebral white matter. Old lacunar infarcts as above. No acute intracranial abnormalities. Electronically Signed   By: Ulyses Southward M.D.   On: 04/18/2019 14:40    Procedures Procedures (including critical care time) CRITICAL CARE Performed by: Karrie Meres   Total critical care time: 31 minutes  Critical care time was exclusive of separately billable procedures and treating other patients.  Critical care was necessary to treat or prevent imminent or life-threatening deterioration. Concern for suspected CVA.   Critical care was time spent personally by me on the following activities: development of treatment plan with patient and/or surrogate as well as nursing, discussions with consultants, evaluation of patient's response to treatment, examination of patient, obtaining history from patient or surrogate, ordering and performing treatments and interventions, ordering and review of laboratory studies, ordering and review of radiographic studies, pulse oximetry and re-evaluation of patient's condition.   Medications Ordered in ED Medications  sodium chloride 0.9 % bolus 1,000 mL (has no administration in time range)    ED Course  I have reviewed the triage vital signs and the nursing notes.  Pertinent labs & imaging results that were available during my care of the patient were reviewed by me and considered in my medical decision making (see chart for details).    MDM Rules/Calculators/A&P                      73 year old female presenting with left-sided weakness onset 2 to 3 days ago.  Also with expression of aphasia per family.  Somewhat hypertensive on arrival, otherwise vital signs reassuring.  She does have weakness of left upper and left lower extremity with some sensory changes to both the arm and the leg.  Also with decreased sensation to the left side of her face without facial droop.  CBC with mild  leukopenia and thrombocytopenia. cmp nonacute coags nonacute etoh negative ua pending at shift change  Ct head neg for acute abnormality.  3:09 PM Discussed case with Dr. Laurence Slate with neurology. He recommends MRI.   AT shift change, care transitioned to Lyndel Safe, PA-C pending MRI And UA. Likely admit for stroke w/u  Final Clinical Impression(s) / ED Diagnoses Final diagnoses:  Stroke-like symptoms    Rx / DC Orders ED Discharge Orders    None       Karrie Meres, PA-C 04/18/19  [address] 04/18/19 1551    Davonna Belling, MD 04/25/19 [phone]

## 2019-04-18 NOTE — ED Notes (Signed)
Patient transported to CT 

## 2019-04-18 NOTE — H&P (Addendum)
History and Physical    Patricia Stanley XMI:[id number] DOB: [birth year] DOA: 04/18/2019  PCP: Josetta Huddle, MD Patient coming from: Home  Chief Complaint: Left-sided weakness  HPI: Patricia Stanley is a 73 y.o. female with medical history significant of hypertension, migraine headaches, depression, GERD being brought to the hospital via EMS for evaluation of left-sided weakness.  History provided by patient and husband at bedside.  For the past 3 days she has had left-sided weakness.  No numbness.  Family has not noticed any difficulty with speech or facial droop.  She has never had a stroke before and is currently not on aspirin.  Husband at bedside is concerned that over the past 1 year patient has lost a significant amount of weight.  She has had loss of appetite and intermittent diarrhea.  No diarrhea recently.  Husband states patient had a colonoscopy done 3 years ago as she was having GI bleed at that time.  States patient has canceled appointments with her PCP and has not had follow-up to discuss her weight loss.  She is a former smoker- 3/4 PPD x 10-12 yrs, quit 30 yrs ago.  ED Course: Blood pressure is elevated with systolic up to 825O.  Head CT negative for acute finding.  Brain MRI showing a 1.4 cm acute infarct within the right corona radiata.  In addition, multiple chronic lacunar infarcts within the left frontal lobe periventricular white matter as well as bilateral basal ganglia and thalami. Patient received 1 L normal saline bolus.  Neurology was consulted.  Review of Systems:  All systems reviewed and apart from history of presenting illness, are negative.  Past Medical History:  Diagnosis Date  . Closed fracture of coccyx ([place]) 2015   AFTER FALL  . Depression   . GERD (gastroesophageal reflux disease)   . Headache    MIGRAINES OCCASIONAL  . History of kidney stones    HAS NOW  . Hypertension     Past Surgical History:  Procedure Laterality Date  . ABDOMINAL  HYSTERECTOMY     1 OVARY LEFT  . APPENDECTOMY  AGE 36   DONE WITH OVARY REMOVAL  . COLONOSCOPY WITH PROPOFOL N/A 12/03/2015   Procedure: COLONOSCOPY WITH PROPOFOL;  Surgeon: Garlan Fair, MD;  Location: [facility];  Service: Endoscopy;  Laterality: N/A;  . ESOPHAGOGASTRODUODENOSCOPY (EGD) WITH PROPOFOL N/A 12/03/2015   Procedure: ESOPHAGOGASTRODUODENOSCOPY (EGD) WITH PROPOFOL;  Surgeon: Garlan Fair, MD;  Location: [facility];  Service: Endoscopy;  Laterality: N/A;  . TONSILLECTOMY  AGE 5   AND ADENOIDS REMOVED     reports that she quit smoking about 23 years ago. She has never used smokeless tobacco. She reports that she does not drink alcohol or use drugs.  Allergies  Allergen Reactions  . Paroxetine Other (See Comments)    Shakes-tremors    History reviewed. No pertinent family history.  Prior to Admission medications   Medication Sig Start Date End Date Taking? Authorizing Provider  atenolol (TENORMIN) 50 MG tablet Take 50 mg by mouth daily.    [provider]  clonazePAM (KLONOPIN) 1 MG tablet Take 1 tablet (1 mg total) by mouth 2 (two) times daily. Patient taking differently: Take 1-2 mg by mouth 2 (two) times daily. Take 1 tablet in the morning, and 2 tablets at bedtime 08/28/12   Josetta Huddle, MD  cloNIDine (CATAPRES) 0.3 MG tablet Take 0.3 mg by mouth daily.    [provider]  FLUoxetine (PROZAC) 40 MG capsule Take 40  mg by mouth daily. 09/07/15   [provider]  ondansetron (ZOFRAN) 4 MG tablet Take 1 tablet (4 mg total) by mouth every 6 (six) hours. 07/20/17   Charlann Lange, PA-C  pantoprazole (PROTONIX) 40 MG tablet Take 40 mg by mouth daily. 08/20/15   [provider]  simvastatin (ZOCOR) 40 MG tablet Take 40 mg by mouth every evening. 09/27/15   [provider]  topiramate (TOPAMAX) 100 MG tablet Take 150 mg by mouth at bedtime. For headaches    [provider]  traMADol (ULTRAM) 50 MG tablet Take 50 mg by  mouth every 8 (eight) hours as needed for pain.     [provider]    Physical Exam: Vitals:   04/18/19 1530 04/18/19 1808 04/18/19 1815 04/18/19 1830  BP: (!) 158/74 (!) 181/84 (!) 180/81 (!) 176/87  Pulse: 66 69 78 73  Resp: 12 17 (!) 22 16  Temp:      TempSrc:      SpO2: 98% 98% 99% 100%  Weight:      Height:        Physical Exam  Constitutional: She is oriented to person, place, and time. No distress.  HENT:  Head: Normocephalic.  Eyes: Right eye exhibits no discharge. Left eye exhibits no discharge.  Cardiovascular: Normal rate, regular rhythm and intact distal pulses.  Pulmonary/Chest: Effort normal and breath sounds normal. No respiratory distress. She has no wheezes. She has no rales.  Abdominal: Soft. Bowel sounds are normal. She exhibits no distension. There is no abdominal tenderness. There is no guarding.  Musculoskeletal:        General: No edema.     Cervical back: Neck supple.  Neurological: She is alert and oriented to person, place, and time. No cranial nerve deficit.  Speech fluent, tongue midline Strength 3 out of 5 in the left upper extremity.  Strength 5 out of 5 in the right upper extremity.  Strength 5 out of 5 in bilateral lower extremities.   Sensation to light touch intact throughout.  Skin: Skin is warm and dry. She is not diaphoretic.     Labs on Admission: I have personally reviewed following labs and imaging studies  CBC: Recent Labs  Lab 04/18/19 1234  WBC 3.8*  NEUTROABS 1.9  HGB 12.6  HCT 38.8  MCV 95.8  PLT 789*   Basic Metabolic Panel: Recent Labs  Lab 04/18/19 1234  NA 144  K 3.5  CL 114*  CO2 22  GLUCOSE 88  BUN 15  CREATININE 1.01*  CALCIUM 8.9   GFR: Estimated Creatinine Clearance: 34.3 mL/min (A) (by C-G formula based on SCr of 1.01 mg/dL (H)). Liver Function Tests: Recent Labs  Lab 04/18/19 1234  AST 21  ALT 13  ALKPHOS 59  BILITOT 0.5  PROT 6.8  ALBUMIN 3.8   No results for input(s): LIPASE,  AMYLASE in the last 168 hours. No results for input(s): AMMONIA in the last 168 hours. Coagulation Profile: Recent Labs  Lab 04/18/19 1234  INR 1.0   Cardiac Enzymes: No results for input(s): CKTOTAL, CKMB, CKMBINDEX, TROPONINI in the last 168 hours. BNP (last 3 results) No results for input(s): PROBNP in the last 8760 hours. HbA1C: No results for input(s): HGBA1C in the last 72 hours. CBG: No results for input(s): GLUCAP in the last 168 hours. Lipid Profile: No results for input(s): CHOL, HDL, LDLCALC, TRIG, CHOLHDL, LDLDIRECT in the last 72 hours. Thyroid Function Tests: No results for input(s): TSH, T4TOTAL, FREET4,  T3FREE, THYROIDAB in the last 72 hours. Anemia Panel: No results for input(s): VITAMINB12, FOLATE, FERRITIN, TIBC, IRON, RETICCTPCT in the last 72 hours. Urine analysis:    Component Value Date/Time   COLORURINE YELLOW 04/18/2019 1807   APPEARANCEUR CLOUDY (A) 04/18/2019 1807   LABSPEC 1.013 04/18/2019 1807   PHURINE 7.0 04/18/2019 1807   GLUCOSEU NEGATIVE 04/18/2019 1807   HGBUR NEGATIVE 04/18/2019 1807   BILIRUBINUR NEGATIVE 04/18/2019 1807   KETONESUR 5 (A) 04/18/2019 1807   PROTEINUR NEGATIVE 04/18/2019 1807   NITRITE NEGATIVE 04/18/2019 1807   LEUKOCYTESUR NEGATIVE 04/18/2019 1807    Radiological Exams on Admission: CT HEAD WO CONTRAST  Result Date: 04/18/2019 CLINICAL DATA:  Focal neural deficit for more than 6 hours suspected stroke, history hypertension, former smoker EXAM: CT HEAD WITHOUT CONTRAST TECHNIQUE: Contiguous axial images were obtained from the base of the skull through the vertex without intravenous contrast. Sagittal and coronal MPR images reconstructed from axial data set. COMPARISON:  08/24/2012 FINDINGS: Brain: Generalized atrophy. Normal ventricular morphology. No midline shift or mass effect. Small vessel chronic ischemic changes of deep cerebral white matter. Old BILATERAL caudate lacunar infarcts. Old BILATERAL basal ganglia lacunar  infarcts with extension on LEFT into periventricular white matter in the LEFT frontal lobe. No intracranial hemorrhage, mass lesion or evidence of acute infarction. No extra-axial fluid collections. Vascular: Atherosclerotic calcification of internal carotid arteries at skull base Skull: Intact Sinuses/Orbits: Clear Other: N/A IMPRESSION: Atrophy with small vessel chronic ischemic changes of deep cerebral white matter. Old lacunar infarcts as above. No acute intracranial abnormalities. Electronically Signed   By: Lavonia Dana M.D.   On: 04/18/2019 14:40   MR BRAIN WO CONTRAST  Result Date: 04/18/2019 CLINICAL DATA:  Suspected stroke, left-sided weakness. EXAM: MRI HEAD WITHOUT CONTRAST TECHNIQUE: Multiplanar, multiecho pulse sequences of the brain and surrounding structures were obtained without intravenous contrast. COMPARISON:  Head CT 04/18/2019, head CT 08/24/2012 FINDINGS: Brain: Acute infarct within the right corona radiata measuring 0.8 x 1.4 x 1.0 cm (series 3, image 34) (series 4, image 17). Corresponding T2/FLAIR hyperintensity at this site. No evidence of intracranial mass. No midline shift or extra-axial fluid collection. Redemonstrated are multiple chronic lacunar infarcts within the left front lobe periventricular white matter, as well as bilateral basal ganglia and thalami. There is a background of otherwise moderate chronic small vessel ischemic disease. There are several chronic microhemorrhages, some supratentorial as well as within the bilateral dentate nuclei. Vascular: Flow voids maintained within the proximal large arterial vessels. Skull and upper cervical spine: No focal marrow lesion Sinuses/Orbits: Visualized orbits demonstrate no acute abnormality. Minimal ethmoid sinus mucosal thickening. Trace fluid within bilateral mastoid air cells. IMPRESSION: 1.4 cm acute infarct within the right corona radiata. Redemonstrated are multiple chronic lacunar infarcts within the left frontal lobe  periventricular white matter, as well as bilateral basal ganglia and thalami. Background of otherwise moderate chronic small vessel ischemic disease. Electronically Signed   By: Kellie Simmering DO   On: 04/18/2019 17:32    EKG: Independently reviewed.  Sinus rhythm, nonspecific T wave abnormalities, baseline wander.  No prior EKG for comparison.  Assessment/Plan Principal Problem:   Acute CVA (cerebrovascular accident) ([place]) Active Problems:   Migraines   HTN (hypertension)   Weight loss, unintentional   Anxiety   Acute ischemic stroke Patient is presenting with a 3-day history of left-sided weakness. Brain MRI showing a 1.4 cm acute infarct within the right corona radiata. -Neurology following, appreciate recommendations -Telemetry monitoring -CTA head and neck -  2D echocardiogram -Hemoglobin A1c, fasting lipid panel -Aspirin 325 p.o. now and daily -Atorvastatin 80 mg now and daily -Frequent neurochecks -PT, OT, speech therapy. -N.p.o. until cleared by bedside swallow evaluation or formal speech evaluation -Outside window for permissive hypertension (discussed with neurology).  Okay to resume blood pressure medications.  Uncontrolled hypertension Per husband, patient has not had regular follow-up with her PCP. Blood pressure is elevated with systolic up to 901Q.  Currently outside window for permissive hypertension in the setting of stroke. -Resume home atenolol and Klonopin -Hydralazine as needed for SBP greater than 180  Unintentional weight loss Per husband, patient has had loss of appetite, intermittent diarrhea, and significant unintentional weight loss over the past 1 year and has not had any recent outpatient work-up done.  Per chart, 16 kg weight loss since 07/2017.  She is a former smoker.  Per chart, patient was last seen by GI in July 2017 for evaluation of chronic watery diarrhea and unintentional weight loss.  Colonoscopy and EGD were done at that time.  Colonoscopy  revealed normal colon and biopsies showing benign colonic mucosa.  EGD showing normal stomach, esophagus, and duodenum.  Small intestinal biopsy showing benign small bowel mucosa and stomach biopsy showing essentially unremarkable gastric type mucosa. -Chest x-ray -TSH, ESR, CRP, A1c, HIV antibody -FOBT -Please ensure GI follow-up after discharge.  Anxiety -Continue home Klonopin  History of migraine headaches -Continue home Topamax  DVT prophylaxis: Lovenox Code Status: Partial code (NO intubation/mechanical ventilation) Family Communication: Husband at bedside. Disposition Plan: Anticipate discharge after clinical improvement. Consults called: Neurology Admission status: It is my clinical opinion that admission to INPATIENT is reasonable and necessary in this 73 y.o. female presenting with left-sided weakness secondary to acute ischemic stroke.  Needs stroke work-up and PT/OT evaluation.  Given the aforementioned, the predictability of an adverse outcome is felt to be significant. I expect that the patient will require at least 2 midnights in the hospital to treat this condition.   The medical decision making on this patient was of high complexity and the patient is at high risk for clinical deterioration, therefore this is a level 3 visit.  Shela Leff MD [facility] Pager [phone]  If 7PM-7AM, please contact night-coverage [email or web address] Password [facility]  04/18/2019, 9:16 PM

## 2019-04-18 NOTE — ED Notes (Signed)
Pure Wick was placed on patient. 

## 2019-04-18 NOTE — ED Notes (Signed)
Patient returned from MRI.

## 2019-04-18 NOTE — ED Notes (Signed)
Patient transported to MRI 

## 2019-04-19 ENCOUNTER — Inpatient Hospital Stay (HOSPITAL_COMMUNITY): Payer: Medicare HMO

## 2019-04-19 DIAGNOSIS — I6389 Other cerebral infarction: Secondary | ICD-10-CM

## 2019-04-19 DIAGNOSIS — I639 Cerebral infarction, unspecified: Secondary | ICD-10-CM

## 2019-04-19 LAB — SEDIMENTATION RATE: Sed Rate: 7 mm/hr (ref 0–22)

## 2019-04-19 LAB — LIPID PANEL
Cholesterol: 152 mg/dL (ref 0–200)
HDL: 53 mg/dL (ref >40)
LDL Cholesterol: 87 mg/dL (ref 0–99)
Total CHOL/HDL Ratio: 2.9 RATIO
Triglycerides: 58 mg/dL (ref <150)
VLDL: 12 mg/dL (ref 0–40)

## 2019-04-19 LAB — ECHOCARDIOGRAM COMPLETE
Height: 61.5 in
Weight: 1495.6 oz

## 2019-04-19 LAB — TSH: TSH: 1.184 u[IU]/mL (ref 0.350–4.500)

## 2019-04-19 LAB — HEMOGLOBIN A1C
Hgb A1c MFr Bld: 5.8 % — ABNORMAL HIGH (ref 4.8–5.6)
Mean Plasma Glucose: 119.76 mg/dL

## 2019-04-19 LAB — SARS CORONAVIRUS 2 (TAT 6-24 HRS): SARS Coronavirus 2: NEGATIVE

## 2019-04-19 LAB — C-REACTIVE PROTEIN: CRP: 0.6 mg/dL (ref <1.0)

## 2019-04-19 LAB — HIV ANTIBODY (ROUTINE TESTING W REFLEX): HIV Screen 4th Generation wRfx: NONREACTIVE

## 2019-04-19 MED ORDER — IOHEXOL 350 MG/ML SOLN
80.0000 mL | Freq: Once | INTRAVENOUS | Status: AC | PRN
  Administered 2019-04-19: 80 mL via INTRAVENOUS

## 2019-04-19 MED ORDER — SODIUM CHLORIDE 0.9 % IV SOLN: INTRAVENOUS | Status: DC

## 2019-04-19 MED ORDER — INFLUENZA VAC A&B SA ADJ QUAD 0.5 ML IM PRSY
0.5000 mL | PREFILLED_SYRINGE | INTRAMUSCULAR | Status: AC
  Administered 2019-04-20: 0.5 mL via INTRAMUSCULAR
  Filled 2019-04-19: qty 0.5

## 2019-04-19 MED ORDER — CLOPIDOGREL BISULFATE 75 MG PO TABS
75.0000 mg | ORAL_TABLET | Freq: Every day | ORAL | Status: DC
  Administered 2019-04-19 – 2019-04-25 (×6): 75 mg via ORAL
  Filled 2019-04-19 (×6): qty 1

## 2019-04-19 MED ORDER — CLONIDINE HCL 0.2 MG PO TABS: 0.2000 mg | ORAL_TABLET | Freq: Every day | ORAL | Status: DC

## 2019-04-19 MED ORDER — SODIUM CHLORIDE 0.9 % IV BOLUS
500.0000 mL | Freq: Once | INTRAVENOUS | Status: AC
  Administered 2019-04-19: 500 mL via INTRAVENOUS

## 2019-04-19 MED ORDER — PNEUMOCOCCAL VAC POLYVALENT 25 MCG/0.5ML IJ INJ
0.5000 mL | INJECTION | INTRAMUSCULAR | Status: AC
  Administered 2019-04-22: 0.5 mL via INTRAMUSCULAR
  Filled 2019-04-19: qty 0.5

## 2019-04-19 NOTE — Progress Notes (Signed)
  Echocardiogram 2D Echocardiogram has been performed.  Patricia Stanley 04/19/2019, 2:10 PM

## 2019-04-19 NOTE — Progress Notes (Signed)
Carotid duplex complete. Please ses CV Proc tab for preliminary results. [place], RVT 1:48 PM  04/19/2019

## 2019-04-19 NOTE — Evaluation (Signed)
Physical Therapy Evaluation Patient Details Name: Patricia Stanley MRN: [id number] DOB: December 01, 1945 Today's Date: 04/19/2019   History of Present Illness  Patricia Stanley is a 73 y.o. female with medical history significant of hypertension, migraine headaches, depression, GERD being brought to the hospital via EMS for evaluation of left-sided weakness. In the ER Brain MRI showing a 1.4 cm acute infarct within the right corona radiata.  Clinical Impression  Pt admitted with above. Pt presenting with L hemiparesis, expressive aphasia, delayed processing, and is requiring maxA for safe transfers. Spoke with spouse who reports patient hasn't left the house in over a year and doesn't talk to anyone but the spouse and daughter. He reports her mind isn't as good either. Pt was ambulating in the home without AD PTA and could complete ADLs but like to have someone help her in/out of shower. Recommending CIR Upon d/c to allow pt to achieve safe supervision level of function for safe transfer home with spouse.     Follow Up Recommendations CIR    Equipment Recommendations  (TBD at next venue)    Recommendations for Other Services Rehab consult     Precautions / Restrictions Precautions Precautions: Fall Precaution Comments: L hemi Restrictions Weight Bearing Restrictions: No      Mobility  Bed Mobility Overal bed mobility: Needs Assistance Bed Mobility: Rolling;Sidelying to Sit Rolling: Max assist Sidelying to sit: Max assist       General bed mobility comments: max directional verbal cues, pt with no initiation, required max verbal and tactile cues, maxA for trunk elevation, pt with R lateral posterior lean  Transfers Overall transfer level: Needs assistance Equipment used: 1 person hand held assist Transfers: Sit to/from [facility] Sit to Stand: Max assist Stand pivot transfers: Max assist       General transfer comment: pt with L knee buckling, unable to grip  PTs hand with L UE, pt did attempted to sequence steps to std pvt to chair however required maxA to prevent fall, pt unable to clear L foot, dragged it to chair  Ambulation/Gait             General Gait Details: unable this date  Stairs            Wheelchair Mobility    Modified Rankin (Stroke Patients Only) Modified Rankin (Stroke Patients Only) Pre-Morbid Rankin Score: Slight disability Modified Rankin: Severe disability     Balance Overall balance assessment: Needs assistance Sitting-balance support: Feet supported;Single extremity supported Sitting balance-Leahy Scale: Poor Sitting balance - Comments: pt requiring modA to maintain EOB balance. pt with R lateral lean and posterior lean without self correction   Standing balance support: During functional activity Standing balance-Leahy Scale: Poor Standing balance comment: dependent on physical assist                             Pertinent Vitals/Pain Pain Assessment: No/denies pain    Home Living Family/patient expects to be discharged to:: Private residence Living Arrangements: Spouse/significant other Available Help at Discharge: Family;Available 24 hours/day Type of Home: House Home Access: Stairs to enter Entrance Stairs-Rails: Can reach both Entrance Stairs-Number of Steps: 4 Home Layout: One level Home Equipment: Walker - 2 wheels;Bedside commode;Shower seat;Grab bars - tub/shower;Hand held shower head      Prior Function Level of Independence: Needs assistance   Gait / Transfers Assistance Needed: didn't use AD  ADL's / Homemaking Assistance Needed: family assist pt into  shower and on the chair,   Comments: spouse states she hasn't been out of the house in a  year     Hand Dominance   Dominant Hand: Left    Extremity/Trunk Assessment   Upper Extremity Assessment Upper Extremity Assessment: LUE deficits/detail LUE Deficits / Details: grip 2/5, elbow flexion 3-/5, shld flexion  with 2+/5, sensation deficit LUE Sensation: decreased light touch;decreased proprioception LUE Coordination: decreased gross motor;decreased fine motor    Lower Extremity Assessment Lower Extremity Assessment: LLE deficits/detail LLE Deficits / Details: grossly 3-/5, decreased sensation LLE Sensation: decreased light touch    Cervical / Trunk Assessment Cervical / Trunk Assessment: Kyphotic  Communication   Communication: Expressive difficulties  Cognition Arousal/Alertness: Awake/alert Behavior During Therapy: WFL for tasks assessed/performed Overall Cognitive Status: Impaired/Different from baseline Area of Impairment: Following commands;Safety/judgement;Awareness;Problem solving                       Following Commands: Follows one step commands with increased time;Follows one step commands consistently;Follows multi-step commands inconsistently Safety/Judgement: Decreased awareness of safety;Decreased awareness of deficits Awareness: Intellectual Problem Solving: Slow processing;Decreased initiation;Difficulty sequencing;Requires verbal cues;Requires tactile cues        General Comments General comments (skin integrity, edema, etc.): VSS    Exercises     Assessment/Plan    PT Assessment Patient needs continued PT services  PT Problem List Decreased strength;Decreased range of motion;Decreased activity tolerance;Decreased balance;Decreased mobility;Decreased coordination;Decreased cognition;Decreased knowledge of use of DME;Decreased safety awareness;Decreased knowledge of precautions       PT Treatment Interventions DME instruction;Gait training;Stair training;Functional mobility training;Therapeutic activities;Therapeutic exercise;Balance training;Neuromuscular re-education;Cognitive remediation;Patient/family education    PT Goals (Current goals can be found in the Care Plan section)  Acute Rehab PT Goals Patient Stated Goal: didn't state PT Goal  Formulation: With patient Time For Goal Achievement: 05/03/19 Potential to Achieve Goals: Good    Frequency Min 4X/week   Barriers to discharge        Co-evaluation               AM-PAC PT "6 Clicks" Mobility  Outcome Measure Help needed turning from your back to your side while in a flat bed without using bedrails?: A Lot Help needed moving from lying on your back to sitting on the side of a flat bed without using bedrails?: A Lot Help needed moving to and from a bed to a chair (including a wheelchair)?: A Lot Help needed standing up from a chair using your arms (e.g., wheelchair or bedside chair)?: A Lot Help needed to walk in hospital room?: Total Help needed climbing 3-5 steps with a railing? : Total 6 Click Score: 10    End of Session Equipment Utilized During Treatment: Gait belt Activity Tolerance: Patient tolerated treatment well Patient left: in chair;with call bell/phone within reach;with chair alarm set Nurse Communication: Mobility status PT Visit Diagnosis: Unsteadiness on feet (R26.81);Other abnormalities of gait and mobility (R26.89);Repeated falls (R29.6);Difficulty in walking, not elsewhere classified (R26.2);Hemiplegia and hemiparesis Hemiplegia - Right/Left: Left Hemiplegia - dominant/non-dominant: Dominant Hemiplegia - caused by: Cerebral infarction    Time: 0746-0809 PT Time Calculation (min) (ACUTE ONLY): 23 min   Charges:   PT Evaluation $PT Eval Moderate Complexity: 1 Mod PT Treatments $Therapeutic Activity: 8-22 mins        Lewis Shock, PT, DPT Acute Rehabilitation Services Pager #: [phone] Office #: [phone]   Iona Hansen 04/19/2019, 10:41 AM

## 2019-04-19 NOTE — ED Notes (Signed)
Patient transported to CT 

## 2019-04-19 NOTE — Progress Notes (Addendum)
PROGRESS NOTE  RUMOR SUN IBB:[id number] DOB: 05-Jan-1946 DOA: 04/18/2019 PCP: Josetta Huddle, MD  Hospital Course/Subjective: Patricia Stanley is a 73 y.o. female with medical history significant of hypertension, migraine headaches, depression, GERD being brought to the hospital via EMS for evaluation of left-sided weakness. In the ER Brain MRI showing a 1.4 cm acute infarct within the right corona radiata and she was admitted for further stroke workup. Started on daily ASA, TTE pending and CTA head and neck shows extensive atherosclerotic changes as well as significant stenosis in the right ICA.   Assessment/Plan: Principal Problem:   Acute CVA (cerebrovascular accident) ([place]) Active Problems:   Migraines   HTN (hypertension)   Weight loss, unintentional   Anxiety   Acute ischemic stroke Patient is presenting with a 3-day history of left-sided weakness. Brain MRI showing a 1.4 cm acute infarct within the right corona radiata. -Neurology following, appreciate recommendations -Telemetry monitoring -CTA head and neck abnormal as listed below, called Vascular consult -2D echocardiogram pending -Hemoglobin A1c, fasting lipid panel -Aspirin 325 p.o. and daily -Atorvastatin 80 mg and daily -Frequent neurochecks -PT, OT, speech therapy. PT recommends some rehab - Okay to resume blood pressure medications.  Uncontrolled hypertension Per husband, patient has not had regular follow-up with her PCP. Blood pressure is elevated with systolic up to 450T.  Currently outside window for permissive hypertension in the setting of stroke. -Resume home atenolol and Klonopin -Hydralazine as needed for SBP greater than 180  Unintentional weight loss Per husband, patient has had loss of appetite, intermittent diarrhea, and significant unintentional weight loss over the past 1 year and has not had any recent outpatient work-up done.  Per chart, 16 kg weight loss since 07/2017.  She is a former  smoker.  Per chart, patient was last seen by GI in July 2017 for evaluation of chronic watery diarrhea and unintentional weight loss.  Colonoscopy and EGD were done at that time.  Colonoscopy revealed normal colon and biopsies showing benign colonic mucosa.  EGD showing normal stomach, esophagus, and duodenum.  Small intestinal biopsy showing benign small bowel mucosa and stomach biopsy showing essentially unremarkable gastric type mucosa. -Chest x-ray pending this AM -TSH, ESR, CRP, A1c, HIV antibody -FOBT -Please ensure GI follow-up after discharge.  Anxiety -Continue home Klonopin  History of migraine headaches -Continue home Topamax  DVT prophylaxis: Lovenox Code Status: Partial code (NO intubation/mechanical ventilation) Family Communication: Husband at bedside. Disposition Plan: Anticipate discharge after clinical improvement. Consults called: Neurology   Objective: Vitals:   04/19/19 0402 04/19/19 0413 04/19/19 0415 04/19/19 0617  BP: 122/68   97/60  Pulse:    (!) 57  Resp: 15   16  Temp:  98.1 F (36.7 C)    TempSrc:  Oral    SpO2: 98%   98%  Weight:   42.4 kg   Height:   5' 1.5" (1.562 m)    No intake or output data in the 24 hours ending 04/19/19 0808 Filed Weights   04/18/19 1115 04/19/19 0415  Weight: 43.1 kg 42.4 kg     Exam: General:  Alert, oriented, calm, in no acute distress, working with PT this AM, able to transfer to chair from bed but with a lot of assistance Eyes: EOMI, clear sclerea Neck: supple, no masses, trachea mildline  Cardiovascular: RRR, no murmurs or rubs, no peripheral edema  Respiratory: clear to auscultation bilaterally, no wheezes, no crackles  Abdomen: soft, nontender, nondistended, normal bowel tones heard  Skin: dry,  no rashes  Musculoskeletal: no joint effusions, normal range of motion  Psychiatric: appropriate affect, normal speech  Neurologic: extraocular muscles intact, clear speech little slow, moving all extremities with  intact sensorium but with marked weakness in left side especially arm   Data Reviewed: CBC: Recent Labs  Lab 04/18/19 1234  WBC 3.8*  NEUTROABS 1.9  HGB 12.6  HCT 38.8  MCV 95.8  PLT 161*   Basic Metabolic Panel: Recent Labs  Lab 04/18/19 1234  NA 144  K 3.5  CL 114*  CO2 22  GLUCOSE 88  BUN 15  CREATININE 1.01*  CALCIUM 8.9   GFR: Estimated Creatinine Clearance: 33.7 mL/min (A) (by C-G formula based on SCr of 1.01 mg/dL (H)). Liver Function Tests: Recent Labs  Lab 04/18/19 1234  AST 21  ALT 13  ALKPHOS 59  BILITOT 0.5  PROT 6.8  ALBUMIN 3.8   No results for input(s): LIPASE, AMYLASE in the last 168 hours. No results for input(s): AMMONIA in the last 168 hours. Coagulation Profile: Recent Labs  Lab 04/18/19 1234  INR 1.0   Cardiac Enzymes: No results for input(s): CKTOTAL, CKMB, CKMBINDEX, TROPONINI in the last 168 hours. BNP (last 3 results) No results for input(s): PROBNP in the last 8760 hours. HbA1C: Recent Labs    04/19/19 0414  HGBA1C 5.8*   CBG: No results for input(s): GLUCAP in the last 168 hours. Lipid Profile: Recent Labs    04/19/19 0414  CHOL 152  HDL 53  LDLCALC 87  TRIG 58  CHOLHDL 2.9   Thyroid Function Tests: Recent Labs    04/19/19 0414  TSH 1.184   Anemia Panel: No results for input(s): VITAMINB12, FOLATE, FERRITIN, TIBC, IRON, RETICCTPCT in the last 72 hours. Urine analysis:    Component Value Date/Time   COLORURINE YELLOW 04/18/2019 1807   APPEARANCEUR CLOUDY (A) 04/18/2019 1807   LABSPEC 1.013 04/18/2019 1807   PHURINE 7.0 04/18/2019 1807   GLUCOSEU NEGATIVE 04/18/2019 1807   HGBUR NEGATIVE 04/18/2019 1807   BILIRUBINUR NEGATIVE 04/18/2019 1807   KETONESUR 5 (A) 04/18/2019 1807   PROTEINUR NEGATIVE 04/18/2019 1807   NITRITE NEGATIVE 04/18/2019 1807   LEUKOCYTESUR NEGATIVE 04/18/2019 1807   Sepsis Labs: _0 (procalcitonin:4,lacticidven:4)  ) Recent Results (from the past 240 hour(s))  SARS  CORONAVIRUS 2 (TAT 6-24 HRS) Nasopharyngeal Nasopharyngeal Swab     Status: None   Collection Time: 04/18/19  6:17 PM   Specimen: Nasopharyngeal Swab  Result Value Ref Range Status   SARS Coronavirus 2 NEGATIVE NEGATIVE Final    Comment: (NOTE) SARS-CoV-2 target nucleic acids are NOT DETECTED. The SARS-CoV-2 RNA is generally detectable in upper and lower respiratory specimens during the acute phase of infection. Negative results do not preclude SARS-CoV-2 infection, do not rule out co-infections with other pathogens, and should not be used as the sole basis for treatment or other patient management decisions. Negative results must be combined with clinical observations, patient history, and epidemiological information. The expected result is Negative. Fact Sheet for Patients: SugarRoll.be Fact Sheet for Healthcare Providers: https://www.woods-mathews.com/ This test is not yet approved or cleared by the [place] FDA and  has been authorized for detection and/or diagnosis of SARS-CoV-2 by FDA under an Emergency Use Authorization (EUA). This EUA will remain  in effect (meaning this test can be used) for the duration of the COVID-19 declaration under Section 56 4(b)(1) of the Act, 21 U.S.C. section 360bbb-3(b)(1), unless the authorization is terminated or revoked sooner. Performed at [facility],  [address] [place], [place] [address]      Studies: CT ANGIO HEAD W OR WO CONTRAST  Result Date: 04/19/2019 CLINICAL DATA:  Initial evaluation for acute stroke. EXAM: CT ANGIOGRAPHY HEAD AND NECK TECHNIQUE: Multidetector CT imaging of the head and neck was performed using the standard protocol during bolus administration of intravenous contrast. Multiplanar CT image reconstructions and MIPs were obtained to evaluate the vascular anatomy. Carotid stenosis measurements (when applicable) are obtained utilizing NASCET criteria, using the  distal internal carotid diameter as the denominator. CONTRAST:  85m OMNIPAQUE IOHEXOL 350 MG/ML SOLN COMPARISON:  Prior CT and MRI from 04/18/2019. FINDINGS: CTA NECK FINDINGS Aortic arch: Visualized aortic arch of normal caliber with normal 3 vessel morphology. Moderate atherosclerotic change seen about the aortic arch and origin of the great vessels. Atheromatous irregularity with stenoses measuring up to 30% seen at the mid left subclavian artery beyond the takeoff of the left vertebral artery. Visualized right subclavian artery widely patent. Right carotid system: Right CCA tortuous proximally. Scattered atheromatous irregularity within the right CCA without hemodynamically significant stenosis. Mix concentric plaque about the right bifurcation/proximal right ICA. Resultant stenosis of up to 65-70% by NASCET criteria. Right ICA otherwise widely patent distally to the skull base without stenosis, dissection, or occlusion. Left carotid system: Atheromatous plaque at the origin of the left CCA with short-segment 50% stenosis. Additional moderate approximate 50% diffuse narrowing seen just distally within the proximal left CCA (series 8, image 293). Left CCA otherwise patent to the bifurcation without flow-limiting stenosis. Mild plaque about the left bifurcation and proximal left ICA without hemodynamically significant stenosis. Left ICA otherwise patent distally to the skull base without stenosis, dissection or occlusion. Vertebral arteries: Both vertebral arteries arise from the subclavian arteries. Atheromatous plaque at the origin of the vertebral arteries bilaterally without high-grade stenosis. Vertebral arteries mildly irregular but widely patent distally to the skull base without stenosis, dissection or occlusion. Skeleton: No acute osseous finding. No discrete lytic or blastic osseous lesions. Mild to moderate cervical spondylolysis noted at C5-6. Other neck: No other acute soft tissue abnormality within  the neck. No mass lesion or adenopathy. Upper chest: Visualized upper chest demonstrates no acute finding. Subpleural fibrotic changes noted within the partially visualized lungs. Review of the MIP images confirms the above findings CTA HEAD FINDINGS Anterior circulation: Petrous segments patent bilaterally. Scattered atheromatous plaque within the cavernous/supraclinoid ICAs bilaterally, left worse than right. Resultant mild to moderate narrowing at the para clinoid right ICA. There is a short-segment fairly severe stenosis at the supraclinoid left ICA (series 8, image 108). ICA termini well perfused. Right A1 widely patent. Left A1 hypoplastic and/or absent. Normal anterior communicating artery. Multifocal atheromatous irregularity with stenoses seen involving the ACAs bilaterally. Both ICAs are perfused to their distal aspects. Irregular moderate approximate 50% stenosis seen involving the proximal right M1 segment (series 8, image 106). Area of involvement measures approximately 7 mm in length. Right M1 patent distally. Normal right MCA bifurcation. Distal right MCA branches are well perfused, although demonstrate extensive small vessel atheromatous irregularity. Left M1 irregular with short-segment moderate stenosis at its mid aspect. Negative left MCA bifurcation. Extensive atheromatous irregularity with multifocal stenoses seen involving the distal left MCA branches. Specific note made of a short-segment severe proximal left M2 stenosis (series 8, image 100). Posterior circulation: Right vertebral artery widely patent to the vertebrobasilar junction without significant stenosis. Patent right PICA. Short-segment severe stenosis seen involving the mid left V4 segment just prior to the takeoff  of the left PICA (series 8, image 138). Left PICA patent. Additional short-segment moderate stenosis at the distal left V4 segment just prior to the vertebrobasilar junction. Multifocal atheromatous or layering 80 seen  involving the basilar artery with associated mild narrowing at its mid aspect. Superior cerebral arteries patent bilaterally. Both PCAs primarily supplied via the basilar. PCAs are fairly diseased with extensive atheromatous irregularity and moderate to severe multifocal stenoses bilaterally. There is an apparent 2 mm focal outpouching arising from the mid left P2 segment, suspicious for a small aneurysm (series 9, image 108). Venous sinuses: Patent. Anatomic variants: None significant. Review of the MIP images confirms the above findings IMPRESSION: 1. Negative CTA for large vessel occlusion. 2. Extensive mixed plaque about the origin of the right ICA with associated stenosis of up to 65-70% by NASCET criteria. 3. Multifocal moderate 50% stenoses seen involving the origin and proximal left common carotid artery as above. 4. Extensive atheromatous disease throughout the intracranial circulation with multifocal moderate to severe stenoses as above. Notable findings include a severe stenosis at the supraclinoid left ICA, moderate proximal right M1 and mid left M1 stenoses, with moderate to severe multifocal left V4 stenoses. Both PCAs are heavily diseased with extensive atheromatous irregularity. 5. 2 mm focal outpouching arising from the right PCA as above, suspicious for a small aneurysm. Electronically Signed   By: Jeannine Boga M.D.   On: 04/19/2019 04:47   CT HEAD WO CONTRAST  Result Date: 04/18/2019 CLINICAL DATA:  Focal neural deficit for more than 6 hours suspected stroke, history hypertension, former smoker EXAM: CT HEAD WITHOUT CONTRAST TECHNIQUE: Contiguous axial images were obtained from the base of the skull through the vertex without intravenous contrast. Sagittal and coronal MPR images reconstructed from axial data set. COMPARISON:  08/24/2012 FINDINGS: Brain: Generalized atrophy. Normal ventricular morphology. No midline shift or mass effect. Small vessel chronic ischemic changes of deep  cerebral white matter. Old BILATERAL caudate lacunar infarcts. Old BILATERAL basal ganglia lacunar infarcts with extension on LEFT into periventricular white matter in the LEFT frontal lobe. No intracranial hemorrhage, mass lesion or evidence of acute infarction. No extra-axial fluid collections. Vascular: Atherosclerotic calcification of internal carotid arteries at skull base Skull: Intact Sinuses/Orbits: Clear Other: N/A IMPRESSION: Atrophy with small vessel chronic ischemic changes of deep cerebral white matter. Old lacunar infarcts as above. No acute intracranial abnormalities. Electronically Signed   By: Lavonia Dana M.D.   On: 04/18/2019 14:40   CT ANGIO NECK W OR WO CONTRAST  Result Date: 04/19/2019 CLINICAL DATA:  Initial evaluation for acute stroke. EXAM: CT ANGIOGRAPHY HEAD AND NECK TECHNIQUE: Multidetector CT imaging of the head and neck was performed using the standard protocol during bolus administration of intravenous contrast. Multiplanar CT image reconstructions and MIPs were obtained to evaluate the vascular anatomy. Carotid stenosis measurements (when applicable) are obtained utilizing NASCET criteria, using the distal internal carotid diameter as the denominator. CONTRAST:  67m OMNIPAQUE IOHEXOL 350 MG/ML SOLN COMPARISON:  Prior CT and MRI from 04/18/2019. FINDINGS: CTA NECK FINDINGS Aortic arch: Visualized aortic arch of normal caliber with normal 3 vessel morphology. Moderate atherosclerotic change seen about the aortic arch and origin of the great vessels. Atheromatous irregularity with stenoses measuring up to 30% seen at the mid left subclavian artery beyond the takeoff of the left vertebral artery. Visualized right subclavian artery widely patent. Right carotid system: Right CCA tortuous proximally. Scattered atheromatous irregularity within the right CCA without hemodynamically significant stenosis. Mix concentric plaque about the right  bifurcation/proximal right ICA. Resultant  stenosis of up to 65-70% by NASCET criteria. Right ICA otherwise widely patent distally to the skull base without stenosis, dissection, or occlusion. Left carotid system: Atheromatous plaque at the origin of the left CCA with short-segment 50% stenosis. Additional moderate approximate 50% diffuse narrowing seen just distally within the proximal left CCA (series 8, image 293). Left CCA otherwise patent to the bifurcation without flow-limiting stenosis. Mild plaque about the left bifurcation and proximal left ICA without hemodynamically significant stenosis. Left ICA otherwise patent distally to the skull base without stenosis, dissection or occlusion. Vertebral arteries: Both vertebral arteries arise from the subclavian arteries. Atheromatous plaque at the origin of the vertebral arteries bilaterally without high-grade stenosis. Vertebral arteries mildly irregular but widely patent distally to the skull base without stenosis, dissection or occlusion. Skeleton: No acute osseous finding. No discrete lytic or blastic osseous lesions. Mild to moderate cervical spondylolysis noted at C5-6. Other neck: No other acute soft tissue abnormality within the neck. No mass lesion or adenopathy. Upper chest: Visualized upper chest demonstrates no acute finding. Subpleural fibrotic changes noted within the partially visualized lungs. Review of the MIP images confirms the above findings CTA HEAD FINDINGS Anterior circulation: Petrous segments patent bilaterally. Scattered atheromatous plaque within the cavernous/supraclinoid ICAs bilaterally, left worse than right. Resultant mild to moderate narrowing at the para clinoid right ICA. There is a short-segment fairly severe stenosis at the supraclinoid left ICA (series 8, image 108). ICA termini well perfused. Right A1 widely patent. Left A1 hypoplastic and/or absent. Normal anterior communicating artery. Multifocal atheromatous irregularity with stenoses seen involving the ACAs  bilaterally. Both ICAs are perfused to their distal aspects. Irregular moderate approximate 50% stenosis seen involving the proximal right M1 segment (series 8, image 106). Area of involvement measures approximately 7 mm in length. Right M1 patent distally. Normal right MCA bifurcation. Distal right MCA branches are well perfused, although demonstrate extensive small vessel atheromatous irregularity. Left M1 irregular with short-segment moderate stenosis at its mid aspect. Negative left MCA bifurcation. Extensive atheromatous irregularity with multifocal stenoses seen involving the distal left MCA branches. Specific note made of a short-segment severe proximal left M2 stenosis (series 8, image 100). Posterior circulation: Right vertebral artery widely patent to the vertebrobasilar junction without significant stenosis. Patent right PICA. Short-segment severe stenosis seen involving the mid left V4 segment just prior to the takeoff of the left PICA (series 8, image 138). Left PICA patent. Additional short-segment moderate stenosis at the distal left V4 segment just prior to the vertebrobasilar junction. Multifocal atheromatous or layering 80 seen involving the basilar artery with associated mild narrowing at its mid aspect. Superior cerebral arteries patent bilaterally. Both PCAs primarily supplied via the basilar. PCAs are fairly diseased with extensive atheromatous irregularity and moderate to severe multifocal stenoses bilaterally. There is an apparent 2 mm focal outpouching arising from the mid left P2 segment, suspicious for a small aneurysm (series 9, image 108). Venous sinuses: Patent. Anatomic variants: None significant. Review of the MIP images confirms the above findings IMPRESSION: 1. Negative CTA for large vessel occlusion. 2. Extensive mixed plaque about the origin of the right ICA with associated stenosis of up to 65-70% by NASCET criteria. 3. Multifocal moderate 50% stenoses seen involving the origin  and proximal left common carotid artery as above. 4. Extensive atheromatous disease throughout the intracranial circulation with multifocal moderate to severe stenoses as above. Notable findings include a severe stenosis at the supraclinoid left ICA, moderate proximal right M1 and  mid left M1 stenoses, with moderate to severe multifocal left V4 stenoses. Both PCAs are heavily diseased with extensive atheromatous irregularity. 5. 2 mm focal outpouching arising from the right PCA as above, suspicious for a small aneurysm. Electronically Signed   By: Jeannine Boga M.D.   On: 04/19/2019 04:47   MR BRAIN WO CONTRAST  Result Date: 04/18/2019 CLINICAL DATA:  Suspected stroke, left-sided weakness. EXAM: MRI HEAD WITHOUT CONTRAST TECHNIQUE: Multiplanar, multiecho pulse sequences of the brain and surrounding structures were obtained without intravenous contrast. COMPARISON:  Head CT 04/18/2019, head CT 08/24/2012 FINDINGS: Brain: Acute infarct within the right corona radiata measuring 0.8 x 1.4 x 1.0 cm (series 3, image 34) (series 4, image 17). Corresponding T2/FLAIR hyperintensity at this site. No evidence of intracranial mass. No midline shift or extra-axial fluid collection. Redemonstrated are multiple chronic lacunar infarcts within the left front lobe periventricular white matter, as well as bilateral basal ganglia and thalami. There is a background of otherwise moderate chronic small vessel ischemic disease. There are several chronic microhemorrhages, some supratentorial as well as within the bilateral dentate nuclei. Vascular: Flow voids maintained within the proximal large arterial vessels. Skull and upper cervical spine: No focal marrow lesion Sinuses/Orbits: Visualized orbits demonstrate no acute abnormality. Minimal ethmoid sinus mucosal thickening. Trace fluid within bilateral mastoid air cells. IMPRESSION: 1.4 cm acute infarct within the right corona radiata. Redemonstrated are multiple chronic  lacunar infarcts within the left frontal lobe periventricular white matter, as well as bilateral basal ganglia and thalami. Background of otherwise moderate chronic small vessel ischemic disease. Electronically Signed   By: Kellie Simmering DO   On: 04/18/2019 17:32    Scheduled Meds: . aspirin  300 mg Rectal Daily   Or  . aspirin  325 mg Oral Daily  . atenolol  50 mg Oral QPM  . atorvastatin  80 mg Oral q1800  . clonazePAM  1 mg Oral q morning - 10a  . clonazePAM  2 mg Oral QHS  . cloNIDine  0.3 mg Oral Daily  . enoxaparin (LOVENOX) injection  30 mg Subcutaneous Q24H  . topiramate  150 mg Oral QHS    Continuous Infusions:   LOS: 1 day   Time spent: 23 minutes  Kyilee Gregg Marry Guan, MD [facility] Pager [phone]  If 7PM-7AM, please contact night-coverage [email or web address] Password [email or web address] 04/19/2019, 8:08 AM

## 2019-04-19 NOTE — ED Notes (Signed)
ED TO INPATIENT HANDOFF REPORT  ED Nurse Name and Phone #: Sharrie Self  S Name/Age/Gender Patricia Stanley 73 y.o. female Room/Bed: 026C/026C  Code Status   Code Status: Partial Code  Home/SNF/Other Home Patient oriented to: self, place, time and situation Is this baseline? Yes   Triage Complete: Triage complete  Chief Complaint Acute CVA (cerebrovascular accident) [facility](HCC) [I63.9]  Triage Note Pt brought from home via EMS. Family reported pt has had left sided weakness with expressive aphasia and slight confusion for 2 days. Pt denies chest pain, shortness of breath, n/v. Pt is currently A&O x4.     Allergies Allergies  Allergen Reactions  . Paroxetine Other (See Comments)    Shakes-tremors    Level of Care/Admitting Diagnosis ED Disposition    ED Disposition Condition Comment   Admit  [facility] [[id number]]  Level of Care: Telemetry Medical [104]  Covid Evaluation: Asymptomatic Screening Protocol (No Symptoms)  Diagnosis: Acute CVA (cerebrovascular accident) [facility](HCC) [[id number]]) [[id number]]  Admitting Physician: John GiovanniATHORE, VASUNDHRA [[id number]]  Attending Physician: John GiovanniATHORE, VASUNDHRA [[id number]]  Estimated length of stay: past midnight tomorrow  Certification:: I certify this patient will need inpatient services for at least 2 midnights       B Medical/Surgery History Past Medical History:  Diagnosis Date  . Closed fracture of coccyx (HCC) 2015   AFTER FALL  . Depression   . GERD (gastroesophageal reflux disease)   . Headache    MIGRAINES OCCASIONAL  . History of kidney stones    HAS NOW  . Hypertension    Past Surgical History:  Procedure Laterality Date  . ABDOMINAL HYSTERECTOMY     1 OVARY LEFT  . APPENDECTOMY  AGE 43   DONE WITH OVARY REMOVAL  . COLONOSCOPY WITH PROPOFOL N/A 12/03/2015   Procedure: COLONOSCOPY WITH PROPOFOL;  Surgeon: Charolett BumpersMartin K Johnson, MD;  Location: [facility];  Service: Endoscopy;  Laterality: N/A;  .  ESOPHAGOGASTRODUODENOSCOPY (EGD) WITH PROPOFOL N/A 12/03/2015   Procedure: ESOPHAGOGASTRODUODENOSCOPY (EGD) WITH PROPOFOL;  Surgeon: Charolett BumpersMartin K Johnson, MD;  Location: [facility];  Service: Endoscopy;  Laterality: N/A;  . TONSILLECTOMY  AGE 55   AND ADENOIDS REMOVED     A IV Location/Drains/Wounds Patient Lines/Drains/Airways Status   Active Line/Drains/Airways    Name:   Placement date:   Placement time:   Site:   Days:   Peripheral IV 04/18/19 Right Antecubital   04/18/19    1108    Antecubital   1   Peripheral IV 04/19/19 Left Antecubital   04/19/19    0236    Antecubital   less than 1          Intake/Output Last 24 hours No intake or output data in the 24 hours ending 04/19/19 0255  Labs/Imaging Results for orders placed or performed during the hospital encounter of 04/18/19 (from the past 48 hour(s))  Ethanol     Status: None   Collection Time: 04/18/19 12:34 PM  Result Value Ref Range   Alcohol, Ethyl (B) <10 <10 mg/dL    Comment: (NOTE) Lowest detectable limit for serum alcohol is 10 mg/dL. For medical purposes only. Performed at [facility], [address] [place], [place] [address]   Protime-INR     Status: None   Collection Time: 04/18/19 12:34 PM  Result Value Ref Range   Prothrombin Time 13.3 11.4 - 15.2 seconds   INR 1.0 0.8 - 1.2    Comment: (NOTE) INR goal varies based on device and disease  states. Performed at [facility], [place] [address] [place], [place] [address]   APTT     Status: None   Collection Time: 04/18/19 12:34 PM  Result Value Ref Range   aPTT 28 24 - 36 seconds    Comment: Performed at [facility] [address] [place], [place] [address]  CBC     Status: Abnormal   Collection Time: 04/18/19 12:34 PM  Result Value Ref Range   WBC 3.8 (L) 4.0 - 10.5 K/uL   RBC 4.05 3.87 - 5.11 MIL/uL   Hemoglobin 12.6 12.0 - 15.0 g/dL   HCT 38.8 36.0 - 46.0 %   MCV 95.8 80.0 - 100.0 fL   MCH 31.1 26.0 - 34.0 pg   MCHC 32.5  30.0 - 36.0 g/dL   RDW 12.6 11.5 - 15.5 %   Platelets 120 (L) 150 - 400 K/uL    Comment: Immature Platelet Fraction may be clinically indicated, consider ordering this additional test [id number]    nRBC 0.0 0.0 - 0.2 %    Comment: Performed at [facility], [place] [address] [place], [place] [address]  Differential     Status: None   Collection Time: 04/18/19 12:34 PM  Result Value Ref Range   Neutrophils Relative % 52 %   Neutro Abs 1.9 1.7 - 7.7 K/uL   Lymphocytes Relative 30 %   Lymphs Abs 1.1 0.7 - 4.0 K/uL   Monocytes Relative 9 %   Monocytes Absolute 0.3 0.1 - 1.0 K/uL   Eosinophils Relative 8 %   Eosinophils Absolute 0.3 0.0 - 0.5 K/uL   Basophils Relative 1 %   Basophils Absolute 0.1 0.0 - 0.1 K/uL   Immature Granulocytes 0 %   Abs Immature Granulocytes 0.00 0.00 - 0.07 K/uL    Comment: Performed at [facility], [place] [address] [place], [place] [address]  Comprehensive metabolic panel     Status: Abnormal   Collection Time: 04/18/19 12:34 PM  Result Value Ref Range   Sodium 144 135 - 145 mmol/L   Potassium 3.5 3.5 - 5.1 mmol/L   Chloride 114 (H) 98 - 111 mmol/L   CO2 22 22 - 32 mmol/L   Glucose, Bld 88 70 - 99 mg/dL   BUN 15 8 - 23 mg/dL   Creatinine, Ser 1.01 (H) 0.44 - 1.00 mg/dL   Calcium 8.9 8.9 - 10.3 mg/dL   Total Protein 6.8 6.5 - 8.1 g/dL   Albumin 3.8 3.5 - 5.0 g/dL   AST 21 15 - 41 U/L   ALT 13 0 - 44 U/L   Alkaline Phosphatase 59 38 - 126 U/L   Total Bilirubin 0.5 0.3 - 1.2 mg/dL   GFR calc non Af Amer 56 (L) >60 mL/min   GFR calc Af Amer >60 >60 mL/min   Anion gap 8 5 - 15    Comment: Performed at [facility] [address], [place], [place] [address]  Urinalysis, Routine w reflex microscopic     Status: Abnormal   Collection Time: 04/18/19  6:07 PM  Result Value Ref Range   Color, Urine YELLOW YELLOW   APPearance CLOUDY (A) CLEAR   Specific Gravity, Urine 1.013 1.005 - 1.030   pH 7.0 5.0 - 8.0   Glucose, UA NEGATIVE NEGATIVE  mg/dL   Hgb urine dipstick NEGATIVE NEGATIVE   Bilirubin Urine NEGATIVE NEGATIVE   Ketones, ur 5 (A) NEGATIVE mg/dL   Protein, ur NEGATIVE  NEGATIVE mg/dL   Nitrite NEGATIVE NEGATIVE   Leukocytes,Ua NEGATIVE NEGATIVE    Comment: Performed at [facility], [address] [place], [place] [address]  SARS CORONAVIRUS 2 (TAT 6-24 HRS) Nasopharyngeal Nasopharyngeal Swab     Status: None   Collection Time: 04/18/19  6:17 PM   Specimen: Nasopharyngeal Swab  Result Value Ref Range   SARS Coronavirus 2 NEGATIVE NEGATIVE    Comment: (NOTE) SARS-CoV-2 target nucleic acids are NOT DETECTED. The SARS-CoV-2 RNA is generally detectable in upper and lower respiratory specimens during the acute phase of infection. Negative results do not preclude SARS-CoV-2 infection, do not rule out co-infections with other pathogens, and should not be used as the sole basis for treatment or other patient management decisions. Negative results must be combined with clinical observations, patient history, and epidemiological information. The expected result is Negative. Fact Sheet for Patients: HairSlick.no Fact Sheet for Healthcare Providers: quierodirigir.com This test is not yet approved or cleared by the [place] FDA and  has been authorized for detection and/or diagnosis of SARS-CoV-2 by FDA under an Emergency Use Authorization (EUA). This EUA will remain  in effect (meaning this test can be used) for the duration of the COVID-19 declaration under Section 56 4(b)(1) of the Act, 21 U.S.C. section 360bbb-3(b)(1), unless the authorization is terminated or revoked sooner. Performed at [facility], [address], [place], [place] [address]    CT HEAD WO CONTRAST  Result Date: 04/18/2019 CLINICAL DATA:  Focal neural deficit for more than 6 hours suspected stroke, history hypertension, former smoker EXAM: CT HEAD WITHOUT CONTRAST TECHNIQUE:  Contiguous axial images were obtained from the base of the skull through the vertex without intravenous contrast. Sagittal and coronal MPR images reconstructed from axial data set. COMPARISON:  08/24/2012 FINDINGS: Brain: Generalized atrophy. Normal ventricular morphology. No midline shift or mass effect. Small vessel chronic ischemic changes of deep cerebral white matter. Old BILATERAL caudate lacunar infarcts. Old BILATERAL basal ganglia lacunar infarcts with extension on LEFT into periventricular white matter in the LEFT frontal lobe. No intracranial hemorrhage, mass lesion or evidence of acute infarction. No extra-axial fluid collections. Vascular: Atherosclerotic calcification of internal carotid arteries at skull base Skull: Intact Sinuses/Orbits: Clear Other: N/A IMPRESSION: Atrophy with small vessel chronic ischemic changes of deep cerebral white matter. Old lacunar infarcts as above. No acute intracranial abnormalities. Electronically Signed   By: Ulyses Southward M.D.   On: 04/18/2019 14:40   MR BRAIN WO CONTRAST  Result Date: 04/18/2019 CLINICAL DATA:  Suspected stroke, left-sided weakness. EXAM: MRI HEAD WITHOUT CONTRAST TECHNIQUE: Multiplanar, multiecho pulse sequences of the brain and surrounding structures were obtained without intravenous contrast. COMPARISON:  Head CT 04/18/2019, head CT 08/24/2012 FINDINGS: Brain: Acute infarct within the right corona radiata measuring 0.8 x 1.4 x 1.0 cm (series 3, image 34) (series 4, image 17). Corresponding T2/FLAIR hyperintensity at this site. No evidence of intracranial mass. No midline shift or extra-axial fluid collection. Redemonstrated are multiple chronic lacunar infarcts within the left front lobe periventricular white matter, as well as bilateral basal ganglia and thalami. There is a background of otherwise moderate chronic small vessel ischemic disease. There are several chronic microhemorrhages, some supratentorial as well as within the bilateral  dentate nuclei. Vascular: Flow voids maintained within the proximal large arterial vessels. Skull and upper cervical spine: No focal marrow lesion Sinuses/Orbits: Visualized orbits demonstrate no acute abnormality. Minimal ethmoid sinus mucosal thickening. Trace fluid within bilateral mastoid air cells. IMPRESSION: 1.4  cm acute infarct within the right corona radiata. Redemonstrated are multiple chronic lacunar infarcts within the left frontal lobe periventricular white matter, as well as bilateral basal ganglia and thalami. Background of otherwise moderate chronic small vessel ischemic disease. Electronically Signed   By: Jackey Loge DO   On: 04/18/2019 17:32    Pending Labs Unresulted Labs (From admission, onward)    Start     Ordered   04/19/19 0500  Hemoglobin A1c  Tomorrow morning,   R     04/18/19 2045   04/19/19 0500  Lipid panel  Tomorrow morning,   R    Comments: Fasting    04/18/19 2045   04/19/19 0500  TSH  Tomorrow morning,   R     04/18/19 2109   04/19/19 0500  Sedimentation rate  Tomorrow morning,   R     04/18/19 2109   04/19/19 0500  C-reactive protein  Tomorrow morning,   R     04/18/19 2109   04/18/19 2110  HIV Antibody (routine testing w rflx)  (HIV Antibody (Routine testing w reflex) panel)  Once,   STAT     04/18/19 2109   04/18/19 2110  Occult blood card to lab, stool  Once,   STAT     04/18/19 2109          Vitals/Pain Today's Vitals   04/18/19 1815 04/18/19 1830 04/19/19 0006 04/19/19 0100  BP: (!) 180/81 (!) 176/87 (!) 185/81 (!) 156/96  Pulse: 78 73 72 76  Resp: (!) Temp:      TempSrc:      SpO2: 99% 100% 96% 96%  Weight:      Height:      PainSc:        Isolation Precautions No active isolations  Medications Medications  clonazePAM (KLONOPIN) tablet 1 mg (has no administration in time range)  topiramate (TOPAMAX) tablet 150 mg (150 mg Oral Given 04/19/19 0156)  atenolol (TENORMIN) tablet 50 mg (50 mg Oral Given 04/19/19 0157)   cloNIDine (CATAPRES) tablet 0.3 mg (0.3 mg Oral Given 04/19/19 0206)  acetaminophen (TYLENOL) tablet 650 mg (has no administration in time range)    Or  acetaminophen (TYLENOL) 160 MG/5ML solution 650 mg (has no administration in time range)    Or  acetaminophen (TYLENOL) suppository 650 mg (has no administration in time range)  enoxaparin (LOVENOX) injection 30 mg (30 mg Subcutaneous Given 04/19/19 0202)  aspirin suppository 300 mg ( Rectal See Alternative 04/19/19 0206)    Or  aspirin tablet 325 mg (325 mg Oral Given 04/19/19 0206)  atorvastatin (LIPITOR) tablet 80 mg (80 mg Oral Given 04/19/19 0158)  hydrALAZINE (APRESOLINE) injection 5 mg (has no administration in time range)  clonazePAM (KLONOPIN) tablet 2 mg (2 mg Oral Given 04/19/19 0157)  sodium chloride 0.9 % bolus 1,000 mL (0 mLs Intravenous Stopped 04/19/19 0117)   stroke: mapping our early stages of recovery book ( Does not apply Given 04/19/19 0208)    Mobility walks with person assist High fall risk   Focused Assessments Neuro Assessment Handoff:  Swallow screen pass? Yes    NIH Stroke Scale ( + Modified Stroke Scale Criteria)  Interval: Initial Level of Consciousness (1a.)   : Alert, keenly responsive LOC Questions (1b. )   +: Answers both questions correctly LOC Commands (1c. )   + : Performs both tasks correctly Best Gaze (2. )  +: Normal Visual (3. )  +: No visual loss  Facial Palsy (4. )    : Normal symmetrical movements Motor Arm, Left (5a. )   +: No drift Motor Arm, Right (5b. )   +: No drift Motor Leg, Left (6a. )   +: No drift Motor Leg, Right (6b. )   +: No drift Limb Ataxia (7. ): Present in two limbs Sensory (8. )   +: Normal, no sensory loss Best Language (9. )   +: No aphasia Dysarthria (10. ): Normal Extinction/Inattention (11.)   +: No Abnormality Modified SS Total  +: 0 Complete NIHSS TOTAL: 6     Neuro Assessment: Exceptions to WDL Neuro Checks:   Initial (04/18/19 1120)  Last  Documented NIHSS Modified Score: 0 (04/19/19 0130) Has TPA been given? No If patient is a Neuro Trauma and patient is going to OR before floor call report to 4N Charge nurse: [phone] or [phone]     R Recommendations: See Admitting Provider Note  Report given to:   Additional Notes:

## 2019-04-19 NOTE — Progress Notes (Signed)
SLP Cancellation Note  Patient Details Name: CYNTHIE GARMON MRN: [id number] DOB: 03/04/1946   Cancelled treatment:       Reason Eval/Treat Not Completed: Patient at procedure or test/unavailable. Will continue efforts to complete SLE.  Carmela Rima, [place] Speech Language Pathologist Office: [phone] Pager: [phone]  Shonna Chock 04/19/2019, 3:45 PM

## 2019-04-19 NOTE — Progress Notes (Signed)
STROKE TEAM PROGRESS NOTE   INTERVAL HISTORY Husband at bedside. Patricia Stanley is sitting in bed for dinner. As per husband, Patricia Stanley LUE weakness worsened this morning, she was able to life up pretty well last night, however, this morning barely lifted up. He thought it was due to the IV in her left arm, which has been removed. Patricia Stanley is left handed and she was able to use left hand to feed herself. MRI showed right carotid 70% stenosis and VVS has been consulted by primary team.   Patricia Stanley has weight loss for the last 2 years. As per husband, she was lack of appetite, not feeling in her stomach. Had colonoscopy 2 years ago but negative. She suppose to have more work up but she did not show up to those appointment.    Vitals:   04/19/19 0617 04/19/19 0829 04/19/19 0845 04/19/19 1016  BP: 97/60 98/62  (!) 95/56  Pulse: (!) 57 (!) 57 (!) 59 64  Resp: 16 13 16 17   Temp:    97.6 F (36.4 C)  TempSrc:    Oral  SpO2: 98% 99% 97% 100%  Weight:      Height:        CBC:  Recent Labs  Lab 04/18/19 1234  WBC 3.8*  NEUTROABS 1.9  HGB 12.6  HCT 38.8  MCV 95.8  PLT 120*    Basic Metabolic Panel:  Recent Labs  Lab 04/18/19 1234  NA 144  K 3.5  CL 114*  CO2 22  GLUCOSE 88  BUN 15  CREATININE 1.01*  CALCIUM 8.9   Lipid Panel:     Component Value Date/Time   CHOL 152 04/19/2019 0414   TRIG 58 04/19/2019 0414   HDL 53 04/19/2019 0414   CHOLHDL 2.9 04/19/2019 0414   VLDL 12 04/19/2019 0414   LDLCALC 87 04/19/2019 0414   HgbA1c:  Lab Results  Component Value Date   HGBA1C 5.8 (H) 04/19/2019   Urine Drug Screen: No results found for: LABOPIA, COCAINSCRNUR, LABBENZ, AMPHETMU, THCU, LABBARB  Alcohol Level     Component Value Date/Time   ETH <10 04/18/2019 1234    IMAGING CT ANGIO HEAD W OR WO CONTRAST  Result Date: 04/19/2019 CLINICAL DATA:  Initial evaluation for acute stroke. EXAM: CT ANGIOGRAPHY HEAD AND NECK TECHNIQUE: Multidetector CT imaging of the head and neck was performed using the  standard protocol during bolus administration of intravenous contrast. Multiplanar CT image reconstructions and MIPs were obtained to evaluate the vascular anatomy. Carotid stenosis measurements (when applicable) are obtained utilizing NASCET criteria, using the distal internal carotid diameter as the denominator. CONTRAST:  80mL OMNIPAQUE IOHEXOL 350 MG/ML SOLN COMPARISON:  Prior CT and MRI from 04/18/2019. FINDINGS: CTA NECK FINDINGS Aortic arch: Visualized aortic arch of normal caliber with normal 3 vessel morphology. Moderate atherosclerotic change seen about the aortic arch and origin of the great vessels. Atheromatous irregularity with stenoses measuring up to 30% seen at the mid left subclavian artery beyond the takeoff of the left vertebral artery. Visualized right subclavian artery widely patent. Right carotid system: Right CCA tortuous proximally. Scattered atheromatous irregularity within the right CCA without hemodynamically significant stenosis. Mix concentric plaque about the right bifurcation/proximal right ICA. Resultant stenosis of up to 65-70% by NASCET criteria. Right ICA otherwise widely patent distally to the skull base without stenosis, dissection, or occlusion. Left carotid system: Atheromatous plaque at the origin of the left CCA with short-segment 50% stenosis. Additional moderate approximate 50% diffuse narrowing seen just distally within  the proximal left CCA (series 8, image 293). Left CCA otherwise patent to the bifurcation without flow-limiting stenosis. Mild plaque about the left bifurcation and proximal left ICA without hemodynamically significant stenosis. Left ICA otherwise patent distally to the skull base without stenosis, dissection or occlusion. Vertebral arteries: Both vertebral arteries arise from the subclavian arteries. Atheromatous plaque at the origin of the vertebral arteries bilaterally without high-grade stenosis. Vertebral arteries mildly irregular but widely patent  distally to the skull base without stenosis, dissection or occlusion. Skeleton: No acute osseous finding. No discrete lytic or blastic osseous lesions. Mild to moderate cervical spondylolysis noted at C5-6. Other neck: No other acute soft tissue abnormality within the neck. No mass lesion or adenopathy. Upper chest: Visualized upper chest demonstrates no acute finding. Subpleural fibrotic changes noted within the partially visualized lungs. Review of the MIP images confirms the above findings CTA HEAD FINDINGS Anterior circulation: Petrous segments patent bilaterally. Scattered atheromatous plaque within the cavernous/supraclinoid ICAs bilaterally, left worse than right. Resultant mild to moderate narrowing at the para clinoid right ICA. There is a short-segment fairly severe stenosis at the supraclinoid left ICA (series 8, image 108). ICA termini well perfused. Right A1 widely patent. Left A1 hypoplastic and/or absent. Normal anterior communicating artery. Multifocal atheromatous irregularity with stenoses seen involving the ACAs bilaterally. Both ICAs are perfused to their distal aspects. Irregular moderate approximate 50% stenosis seen involving the proximal right M1 segment (series 8, image 106). Area of involvement measures approximately 7 mm in length. Right M1 patent distally. Normal right MCA bifurcation. Distal right MCA branches are well perfused, although demonstrate extensive small vessel atheromatous irregularity. Left M1 irregular with short-segment moderate stenosis at its mid aspect. Negative left MCA bifurcation. Extensive atheromatous irregularity with multifocal stenoses seen involving the distal left MCA branches. Specific note made of a short-segment severe proximal left M2 stenosis (series 8, image 100). Posterior circulation: Right vertebral artery widely patent to the vertebrobasilar junction without significant stenosis. Patent right PICA. Short-segment severe stenosis seen involving the mid  left V4 segment just prior to the takeoff of the left PICA (series 8, image 138). Left PICA patent. Additional short-segment moderate stenosis at the distal left V4 segment just prior to the vertebrobasilar junction. Multifocal atheromatous or layering 80 seen involving the basilar artery with associated mild narrowing at its mid aspect. Superior cerebral arteries patent bilaterally. Both PCAs primarily supplied via the basilar. PCAs are fairly diseased with extensive atheromatous irregularity and moderate to severe multifocal stenoses bilaterally. There is an apparent 2 mm focal outpouching arising from the mid left P2 segment, suspicious for a small aneurysm (series 9, image 108). Venous sinuses: Patent. Anatomic variants: None significant. Review of the MIP images confirms the above findings IMPRESSION: 1. Negative CTA for large vessel occlusion. 2. Extensive mixed plaque about the origin of the right ICA with associated stenosis of up to 65-70% by NASCET criteria. 3. Multifocal moderate 50% stenoses seen involving the origin and proximal left common carotid artery as above. 4. Extensive atheromatous disease throughout the intracranial circulation with multifocal moderate to severe stenoses as above. Notable findings include a severe stenosis at the supraclinoid left ICA, moderate proximal right M1 and mid left M1 stenoses, with moderate to severe multifocal left V4 stenoses. Both PCAs are heavily diseased with extensive atheromatous irregularity. 5. 2 mm focal outpouching arising from the right PCA as above, suspicious for a small aneurysm. Electronically Signed   By: Rise Mu M.D.   On: 04/19/2019 04:47  DG Chest 2 View  Result Date: 04/19/2019 CLINICAL DATA:  Left-sided weakness, expressive aphasia and confusion for the past 2 days. EXAM: CHEST - 2 VIEW COMPARISON:  08/24/2012 FINDINGS: Grossly unchanged cardiac silhouette and mediastinal contours with atherosclerotic plaque within the  thoracic aorta. There is persistent thickening the right paratracheal stripe presumably secondary to prominent vasculature. Lungs remain hyperexpanded with flattening of the diaphragms and diffuse slightly nodular thickening of the pulmonary interstitium. No discrete focal airspace opacities. No pleural effusion or pneumothorax. No evidence of edema. Old/healed fractures involving the posterolateral aspect of the left ninth and tenth ribs. No acute osseous abnormalities. IMPRESSION: 1. Similar findings of lung hyperexpansion and chronic bronchitic change without superimposed acute cardiopulmonary disease. 2.  Aortic Atherosclerosis (ICD10-I70.0). Electronically Signed   By: Simonne Come M.D.   On: 04/19/2019 08:14   CT HEAD WO CONTRAST  Result Date: 04/18/2019 CLINICAL DATA:  Focal neural deficit for more than 6 hours suspected stroke, history hypertension, former smoker EXAM: CT HEAD WITHOUT CONTRAST TECHNIQUE: Contiguous axial images were obtained from the base of the skull through the vertex without intravenous contrast. Sagittal and coronal MPR images reconstructed from axial data set. COMPARISON:  08/24/2012 FINDINGS: Brain: Generalized atrophy. Normal ventricular morphology. No midline shift or mass effect. Small vessel chronic ischemic changes of deep cerebral white matter. Old BILATERAL caudate lacunar infarcts. Old BILATERAL basal ganglia lacunar infarcts with extension on LEFT into periventricular white matter in the LEFT frontal lobe. No intracranial hemorrhage, mass lesion or evidence of acute infarction. No extra-axial fluid collections. Vascular: Atherosclerotic calcification of internal carotid arteries at skull base Skull: Intact Sinuses/Orbits: Clear Other: N/A IMPRESSION: Atrophy with small vessel chronic ischemic changes of deep cerebral white matter. Old lacunar infarcts as above. No acute intracranial abnormalities. Electronically Signed   By: Ulyses Southward M.D.   On: 04/18/2019 14:40   CT  ANGIO NECK W OR WO CONTRAST  Result Date: 04/19/2019 CLINICAL DATA:  Initial evaluation for acute stroke. EXAM: CT ANGIOGRAPHY HEAD AND NECK TECHNIQUE: Multidetector CT imaging of the head and neck was performed using the standard protocol during bolus administration of intravenous contrast. Multiplanar CT image reconstructions and MIPs were obtained to evaluate the vascular anatomy. Carotid stenosis measurements (when applicable) are obtained utilizing NASCET criteria, using the distal internal carotid diameter as the denominator. CONTRAST:  80mL OMNIPAQUE IOHEXOL 350 MG/ML SOLN COMPARISON:  Prior CT and MRI from 04/18/2019. FINDINGS: CTA NECK FINDINGS Aortic arch: Visualized aortic arch of normal caliber with normal 3 vessel morphology. Moderate atherosclerotic change seen about the aortic arch and origin of the great vessels. Atheromatous irregularity with stenoses measuring up to 30% seen at the mid left subclavian artery beyond the takeoff of the left vertebral artery. Visualized right subclavian artery widely patent. Right carotid system: Right CCA tortuous proximally. Scattered atheromatous irregularity within the right CCA without hemodynamically significant stenosis. Mix concentric plaque about the right bifurcation/proximal right ICA. Resultant stenosis of up to 65-70% by NASCET criteria. Right ICA otherwise widely patent distally to the skull base without stenosis, dissection, or occlusion. Left carotid system: Atheromatous plaque at the origin of the left CCA with short-segment 50% stenosis. Additional moderate approximate 50% diffuse narrowing seen just distally within the proximal left CCA (series 8, image 293). Left CCA otherwise patent to the bifurcation without flow-limiting stenosis. Mild plaque about the left bifurcation and proximal left ICA without hemodynamically significant stenosis. Left ICA otherwise patent distally to the skull base without stenosis, dissection or occlusion. Vertebral  arteries: Both vertebral arteries arise from the subclavian arteries. Atheromatous plaque at the origin of the vertebral arteries bilaterally without high-grade stenosis. Vertebral arteries mildly irregular but widely patent distally to the skull base without stenosis, dissection or occlusion. Skeleton: No acute osseous finding. No discrete lytic or blastic osseous lesions. Mild to moderate cervical spondylolysis noted at C5-6. Other neck: No other acute soft tissue abnormality within the neck. No mass lesion or adenopathy. Upper chest: Visualized upper chest demonstrates no acute finding. Subpleural fibrotic changes noted within the partially visualized lungs. Review of the MIP images confirms the above findings CTA HEAD FINDINGS Anterior circulation: Petrous segments patent bilaterally. Scattered atheromatous plaque within the cavernous/supraclinoid ICAs bilaterally, left worse than right. Resultant mild to moderate narrowing at the para clinoid right ICA. There is a short-segment fairly severe stenosis at the supraclinoid left ICA (series 8, image 108). ICA termini well perfused. Right A1 widely patent. Left A1 hypoplastic and/or absent. Normal anterior communicating artery. Multifocal atheromatous irregularity with stenoses seen involving the ACAs bilaterally. Both ICAs are perfused to their distal aspects. Irregular moderate approximate 50% stenosis seen involving the proximal right M1 segment (series 8, image 106). Area of involvement measures approximately 7 mm in length. Right M1 patent distally. Normal right MCA bifurcation. Distal right MCA branches are well perfused, although demonstrate extensive small vessel atheromatous irregularity. Left M1 irregular with short-segment moderate stenosis at its mid aspect. Negative left MCA bifurcation. Extensive atheromatous irregularity with multifocal stenoses seen involving the distal left MCA branches. Specific note made of a short-segment severe proximal left M2  stenosis (series 8, image 100). Posterior circulation: Right vertebral artery widely patent to the vertebrobasilar junction without significant stenosis. Patent right PICA. Short-segment severe stenosis seen involving the mid left V4 segment just prior to the takeoff of the left PICA (series 8, image 138). Left PICA patent. Additional short-segment moderate stenosis at the distal left V4 segment just prior to the vertebrobasilar junction. Multifocal atheromatous or layering 80 seen involving the basilar artery with associated mild narrowing at its mid aspect. Superior cerebral arteries patent bilaterally. Both PCAs primarily supplied via the basilar. PCAs are fairly diseased with extensive atheromatous irregularity and moderate to severe multifocal stenoses bilaterally. There is an apparent 2 mm focal outpouching arising from the mid left P2 segment, suspicious for a small aneurysm (series 9, image 108). Venous sinuses: Patent. Anatomic variants: None significant. Review of the MIP images confirms the above findings IMPRESSION: 1. Negative CTA for large vessel occlusion. 2. Extensive mixed plaque about the origin of the right ICA with associated stenosis of up to 65-70% by NASCET criteria. 3. Multifocal moderate 50% stenoses seen involving the origin and proximal left common carotid artery as above. 4. Extensive atheromatous disease throughout the intracranial circulation with multifocal moderate to severe stenoses as above. Notable findings include a severe stenosis at the supraclinoid left ICA, moderate proximal right M1 and mid left M1 stenoses, with moderate to severe multifocal left V4 stenoses. Both PCAs are heavily diseased with extensive atheromatous irregularity. 5. 2 mm focal outpouching arising from the right PCA as above, suspicious for a small aneurysm. Electronically Signed   By: Rise Mu M.D.   On: 04/19/2019 04:47   MR BRAIN WO CONTRAST  Result Date: 04/18/2019 CLINICAL DATA:   Suspected stroke, left-sided weakness. EXAM: MRI HEAD WITHOUT CONTRAST TECHNIQUE: Multiplanar, multiecho pulse sequences of the brain and surrounding structures were obtained without intravenous contrast. COMPARISON:  Head CT 04/18/2019, head CT 08/24/2012 FINDINGS: Brain: Acute  infarct within the right corona radiata measuring 0.8 x 1.4 x 1.0 cm (series 3, image 34) (series 4, image 17). Corresponding T2/FLAIR hyperintensity at this site. No evidence of intracranial mass. No midline shift or extra-axial fluid collection. Redemonstrated are multiple chronic lacunar infarcts within the left front lobe periventricular white matter, as well as bilateral basal ganglia and thalami. There is a background of otherwise moderate chronic small vessel ischemic disease. There are several chronic microhemorrhages, some supratentorial as well as within the bilateral dentate nuclei. Vascular: Flow voids maintained within the proximal large arterial vessels. Skull and upper cervical spine: No focal marrow lesion Sinuses/Orbits: Visualized orbits demonstrate no acute abnormality. Minimal ethmoid sinus mucosal thickening. Trace fluid within bilateral mastoid air cells. IMPRESSION: 1.4 cm acute infarct within the right corona radiata. Redemonstrated are multiple chronic lacunar infarcts within the left frontal lobe periventricular white matter, as well as bilateral basal ganglia and thalami. Background of otherwise moderate chronic small vessel ischemic disease. Electronically Signed   By: Jackey Loge DO   On: 04/18/2019 17:32    PHYSICAL EXAM  Temp:  [97.4 F (36.3 C)-98.1 F (36.7 C)] 97.4 F (36.3 C) (12/15 1426) Pulse Rate:  [57-78] 70 (12/15 1700) Resp:  [13-22] 13 (12/15 1700) BP: (95-185)/(55-96) 123/58 (12/15 1700) SpO2:  [96 %-100 %] 99 % (12/15 1700) Weight:  [42.4 kg] 42.4 kg (12/15 0415)  General - Well nourished, well developed, in no apparent distress.  Ophthalmologic - fundi not visualized due to  noncooperation.  Cardiovascular - Regular rhythm and rate.  Mental Status -  Level of arousal and orientation to time, place, and person were intact. Language including expression, naming, repetition, comprehension was assessed and found intact. Fund of Knowledge was assessed and was intact.  Cranial Nerves II - XII - II - Visual field intact OU. III, IV, VI - Extraocular movements intact. V - Facial sensation intact bilaterally. VII - Facial movement intact bilaterally. VIII - Hearing & vestibular intact bilaterally. X - Palate elevates symmetrically. XI - Chin turning & shoulder shrug intact bilaterally. XII - Tongue protrusion intact.  Motor Strength - The patient's strength was normal in RUE and RLE however, LUE3/5 and LLE 4/5.  Bulk was normal and fasciculations were absent.   Motor Tone - Muscle tone was assessed at the neck and appendages and was normal.  Reflexes - The patient's reflexes were symmetrical in all extremities and she had no pathological reflexes.  Sensory - Light touch, temperature/pinprick were assessed and were symmetrical.    Coordination - The patient had normal movements in the right hand with no ataxia or dysmetria.  Tremor was absent.  Gait and Station - deferred.   ASSESSMENT/PLAN Patricia Stanley is a 73 y.o. female with history of migraines, hypertension, depression, gastroesophageal reflux and ongoing weight loss with over more than a year of unexplainable weight loss for which she has received a GI work-up, presenting with 2 to 3 days worth of left-sided weakness and difficulty with speech.   Stroke:  right CR infarct secondary to small vessel disease in setting of large vessel disease source  CT head Small vessel disease. Atrophy. Old lacunes. No acute abnormality.   MRI  R corona radiata infarct. Multiple old lacunes L frontal lobe, B basal ganglia, B thalami. Small vessel disease.   CTA head & neck no LVO. Extensive plaque R ICA w/  65-70% stenosis. Multifocal moderate L CCA/origin 50% stenosis. extenvie atherosclerosis w/ severe stenosis supraclinoid L ICA, moderate proximal  R M1 and L M1, moderate to severe multifocal L V4 stenoses. B PCAs extensive atherosclerosis. 61mm outpouching R PCA ? Small aneurysm.  Carotid Doppler right ICA 40-59% stenosis  2D Echo EF 60-65%  Recommend VVS consult  LDL 87  HgbA1c 5.8  Lovenox 30 mg sq daily for VTE prophylaxis  No antithrombotic prior to admission, now on aspirin 325 mg daily and clopidogrel 75 mg daily.   Therapy recommendations:  CIR  Disposition:  pending   Symptomatic right ICA stenosis  CTA head & neck Extensive plaque R ICA w/ 65-70% stenosis.   Carotid Doppler right ICA 40-59% stenosis  VVS consulted  Hypotension  Hx of hypertension  Low BP this am after clonidine, which could explain Patricia Stanley neuro worsening  Clonidine discontinued . Permissive hypertension (OK if < 220/120) but gradually normalize in 5-7 days . Long-term BP goal 130-160 before carotid intervention . NS bolus 500 cc . NS IVF @ 75cc/h   Hyperlipidemia  Home meds:  zocor 40  Now on lipitor 80  LDL 87, goal < 70  Continue statin at discharge  Other Stroke Risk Factors  Advanced age  Former cigarette smoker  Migraines on topamax  Other Active Problems  Cognitive decline for 1-2 years  Unintentional weight loss - continue outpt work up  Anxiety on [facility] day # 1   Rosalin Hawking, MD PhD Stroke Neurology 04/19/2019 5:38 PM  To contact Stroke Continuity provider, please refer to [email or web address] After hours, contact General Neurology

## 2019-04-19 NOTE — Consult Note (Signed)
Physical Medicine and Rehabilitation Consult Reason for Consult: Left side weakness Referring Physician: [facility]   HPI: Patricia Stanley is a 73 y.o. right-handed female with history of hypertension, migraine headaches and quit smoking 23 years ago.  Per chart review lives with spouse.  1 level home 4 steps to entry.  By report patient ambulatory in the home but has not left the house in over a year and was very sedentary as well as report of significant weight loss over the past year.  Patient presented 04/18/2019 with left-sided weakness x3 days with difficulty in speech and facial droop.  Noted blood pressure systolic in the 180s.  Cranial CT scan negative.  Patient did not receive TPA.  MRI showed a 1.4 cm acute infarct within the right corona radiata in addition to multiple chronic lacunar infarcts within the left frontal lobe periventricular white matter as well as bilateral basal ganglia and thalami.  Neurology felt stroke was d/t small vessel disease. Echocardiogram just complete.  CT angiogram of head and neck negative for large vessel occlusion.  Admission chemistries with creatinine 1.01, SARS coronavirus negative, alcohol level negative, WBC 3.8.  Neurology follow-up currently on aspirin Plavix for CVA prophylaxis.  Subcutaneous Lovenox for DVT prophylaxis.  Tolerating a regular diet.  Therapy evaluations completed with recommendations of physical medicine rehab consult.   Review of Systems  Constitutional: Positive for weight loss. Negative for fever.  HENT: Negative for hearing loss.   Eyes: Negative for blurred vision and double vision.  Respiratory: Negative for cough and shortness of breath.   Cardiovascular: Negative for chest pain and leg swelling.  Gastrointestinal: Positive for constipation. Negative for heartburn.       GERD  Genitourinary: Negative for dysuria, flank pain and hematuria.  Musculoskeletal: Positive for joint pain and myalgias.  Skin: Negative for rash.    Neurological: Positive for headaches.  Psychiatric/Behavioral: Positive for depression. The patient has insomnia.   All other systems reviewed and are negative.  Past Medical History:  Diagnosis Date  . Closed fracture of coccyx (HCC) 2015   AFTER FALL  . Depression   . GERD (gastroesophageal reflux disease)   . Headache    MIGRAINES OCCASIONAL  . History of kidney stones    HAS NOW  . Hypertension    Past Surgical History:  Procedure Laterality Date  . ABDOMINAL HYSTERECTOMY     1 OVARY LEFT  . APPENDECTOMY  AGE 63   DONE WITH OVARY REMOVAL  . COLONOSCOPY WITH PROPOFOL N/A 12/03/2015   Procedure: COLONOSCOPY WITH PROPOFOL;  Surgeon: Charolett Bumpers, MD;  Location: [facility];  Service: Endoscopy;  Laterality: N/A;  . ESOPHAGOGASTRODUODENOSCOPY (EGD) WITH PROPOFOL N/A 12/03/2015   Procedure: ESOPHAGOGASTRODUODENOSCOPY (EGD) WITH PROPOFOL;  Surgeon: Charolett Bumpers, MD;  Location: [facility];  Service: Endoscopy;  Laterality: N/A;  . TONSILLECTOMY  AGE 76   AND ADENOIDS REMOVED   History reviewed. No pertinent family history. Social History:  reports that she quit smoking about 23 years ago. She has never used smokeless tobacco. She reports that she does not drink alcohol or use drugs. Allergies:  Allergies  Allergen Reactions  . Paroxetine Other (See Comments)    Shakes-tremors   Medications Prior to Admission  Medication Sig Dispense Refill  . atenolol (TENORMIN) 50 MG tablet Take 50 mg by mouth every evening.     . clonazePAM (KLONOPIN) 1 MG tablet Take 1 tablet (1 mg total) by mouth 2 (two) times daily. (Patient  taking differently: Take 1-2 mg by mouth See admin instructions. Take 1 tablet in the morning, and 2 tablets at bedtime) 30 tablet 1  . cloNIDine (CATAPRES) 0.3 MG tablet Take 0.3 mg by mouth daily.    . ondansetron (ZOFRAN) 4 MG tablet Take 1 tablet (4 mg total) by mouth every 6 (six) hours. (Patient taking differently: Take 4 mg by mouth every 8 (eight)  hours as needed for nausea or vomiting. ) 12 tablet 0  . simvastatin (ZOCOR) 40 MG tablet Take 40 mg by mouth every evening.    . topiramate (TOPAMAX) 100 MG tablet Take 150 mg by mouth at bedtime. For headaches      Home: Home Living Family/patient expects to be discharged to:: Private residence Living Arrangements: Spouse/significant other Available Help at Discharge: Family, Available 24 hours/day Type of Home: House Home Access: Stairs to enter [employer] of Steps: 4 Entrance Stairs-Rails: Can reach both Home Layout: One level Bathroom Shower/Tub: Chiropodist: Standard Home Equipment: Environmental consultant - 2 wheels, Bedside commode, Shower seat, Grab bars - tub/shower, Hand held shower head  Functional History: Prior Function Level of Independence: Needs assistance Gait / Transfers Assistance Needed: didn't use AD ADL's / Homemaking Assistance Needed: family assist pt into shower and on the chair,  Communication / Swallowing Assistance Needed: spouse states her mind isn't as fast as it used to be, she's had a hard time explaining things to me Comments: spouse states she hasn't been out of the house in a  year Functional Status:  Mobility: Bed Mobility Overal bed mobility: Needs Assistance Bed Mobility: Rolling, Sidelying to Sit Rolling: Max assist Sidelying to sit: Max assist General bed mobility comments: max directional verbal cues, pt with no initiation, required max verbal and tactile cues, maxA for trunk elevation, pt with R lateral posterior lean Transfers Overall transfer level: Needs assistance Equipment used: 1 person hand held assist Transfers: Sit to/from Stand, Stand Pivot Transfers Sit to Stand: Max assist Stand pivot transfers: Max assist General transfer comment: pt with L knee buckling, unable to grip PTs hand with L UE, pt did attempted to sequence steps to std pvt to chair however required maxA to prevent fall, pt unable to clear L foot,  dragged it to chair Ambulation/Gait General Gait Details: unable this date    ADL: ADL Overall ADL's : Needs assistance/impaired Eating/Feeding: Set up, Sitting Eating/Feeding Details (indicate cue type and reason): assist cutting up food, opening containers as patient is L hand dominant and having to use mainly R hand for self care tasks Grooming: Oral care, Wash/dry face, Sitting, Minimal assistance Grooming Details (indicate cue type and reason): set up initially, min A for trunk control and to correct posture in order to spit into basin Upper Body Bathing: Sitting, Moderate assistance Upper Body Bathing Details (indicate cue type and reason): due to deficits in L UE Lower Body Bathing: Sitting/lateral leans, Total assistance Lower Body Bathing Details (indicate cue type and reason): decreased trunk control Upper Body Dressing : Moderate assistance, Sitting Upper Body Dressing Details (indicate cue type and reason): due to limited trunk control and patient limited use of dominant hand Lower Body Dressing: Total assistance, Sitting/lateral leans Lower Body Dressing Details (indicate cue type and reason): limited trunk control, limited use of L UE Toilet Transfer: +2 for physical assistance, +2 for safety/equipment, Stand-pivot, BSC Toilet Transfer Details (indicate cue type and reason): did not attempt this session as patient's breakfast arrived, anticipate assist of 2 for transfers for  safety as patient has limited core stabilization  Toileting- Clothing Manipulation and Hygiene: Total assistance, Sitting/lateral lean General ADL Comments: min A for UB ADL sitting supported in seat, difficulty with trunk control  Cognition: Cognition Overall Cognitive Status: Impaired/Different from baseline Orientation Level: Oriented X4 Cognition Arousal/Alertness: Awake/alert Behavior During Therapy: WFL for tasks assessed/performed Overall Cognitive Status: Impaired/Different from  baseline Area of Impairment: Following commands, Safety/judgement, Awareness, Problem solving Following Commands: Follows one step commands with increased time, Follows one step commands consistently, Follows multi-step commands inconsistently Safety/Judgement: Decreased awareness of safety, Decreased awareness of deficits Awareness: Intellectual Problem Solving: Slow processing, Decreased initiation, Difficulty sequencing, Requires verbal cues, Requires tactile cues  Blood pressure (!) 95/56, pulse 64, temperature 97.6 F (36.4 C), temperature source Oral, resp. rate 17, height 5' 1.5" (1.562 m), weight 42.4 kg, SpO2 100 %. Physical Exam  Constitutional: She appears well-developed. No distress.  HENT:  Head: Normocephalic.  Eyes: EOM are normal.  Neck: No thyromegaly present.  Cardiovascular: Normal rate.  Respiratory: Effort normal.  GI: Soft.  Musculoskeletal:     Cervical back: Normal range of motion.  Neurological:  Patient is alert resting comfortably with husband at bedside.  She does make eye contact and appropriate to provide her name and age. Left central 7. RUE and RLE 5/5.  Left HP 3-4/5. With [facility]. Senses pain in all 4.   Skin: Skin is warm.    Results for orders placed or performed during the hospital encounter of 04/18/19 (from the past 24 hour(s))  Urinalysis, Routine w reflex microscopic     Status: Abnormal   Collection Time: 04/18/19  6:07 PM  Result Value Ref Range   Color, Urine YELLOW YELLOW   APPearance CLOUDY (A) CLEAR   Specific Gravity, Urine 1.013 1.005 - 1.030   pH 7.0 5.0 - 8.0   Glucose, UA NEGATIVE NEGATIVE mg/dL   Hgb urine dipstick NEGATIVE NEGATIVE   Bilirubin Urine NEGATIVE NEGATIVE   Ketones, ur 5 (A) NEGATIVE mg/dL   Protein, ur NEGATIVE NEGATIVE mg/dL   Nitrite NEGATIVE NEGATIVE   Leukocytes,Ua NEGATIVE NEGATIVE  SARS CORONAVIRUS 2 (TAT 6-24 HRS) Nasopharyngeal Nasopharyngeal Swab     Status: None   Collection Time: 04/18/19  6:17 PM    Specimen: Nasopharyngeal Swab  Result Value Ref Range   SARS Coronavirus 2 NEGATIVE NEGATIVE  Hemoglobin A1c     Status: Abnormal   Collection Time: 04/19/19  4:14 AM  Result Value Ref Range   Hgb A1c MFr Bld 5.8 (H) 4.8 - 5.6 %   Mean Plasma Glucose 119.76 mg/dL  Lipid panel     Status: None   Collection Time: 04/19/19  4:14 AM  Result Value Ref Range   Cholesterol 152 0 - 200 mg/dL   Triglycerides 58 <161 mg/dL   HDL 53 >09 mg/dL   Total CHOL/HDL Ratio 2.9 RATIO   VLDL 12 0 - 40 mg/dL   LDL Cholesterol 87 0 - 99 mg/dL  TSH     Status: None   Collection Time: 04/19/19  4:14 AM  Result Value Ref Range   TSH 1.184 0.350 - 4.500 uIU/mL  Sedimentation rate     Status: None   Collection Time: 04/19/19  4:14 AM  Result Value Ref Range   Sed Rate 7 0 - 22 mm/hr  C-reactive protein     Status: None   Collection Time: 04/19/19  4:14 AM  Result Value Ref Range   CRP 0.6 <1.0 mg/dL  HIV Antibody (  routine testing w rflx)     Status: None   Collection Time: 04/19/19  4:14 AM  Result Value Ref Range   HIV Screen 4th Generation wRfx NON REACTIVE NON REACTIVE   CT ANGIO HEAD W OR WO CONTRAST  Result Date: 04/19/2019 CLINICAL DATA:  Initial evaluation for acute stroke. EXAM: CT ANGIOGRAPHY HEAD AND NECK TECHNIQUE: Multidetector CT imaging of the head and neck was performed using the standard protocol during bolus administration of intravenous contrast. Multiplanar CT image reconstructions and MIPs were obtained to evaluate the vascular anatomy. Carotid stenosis measurements (when applicable) are obtained utilizing NASCET criteria, using the distal internal carotid diameter as the denominator. CONTRAST:  80mL OMNIPAQUE IOHEXOL 350 MG/ML SOLN COMPARISON:  Prior CT and MRI from 04/18/2019. FINDINGS: CTA NECK FINDINGS Aortic arch: Visualized aortic arch of normal caliber with normal 3 vessel morphology. Moderate atherosclerotic change seen about the aortic arch and origin of the great vessels.  Atheromatous irregularity with stenoses measuring up to 30% seen at the mid left subclavian artery beyond the takeoff of the left vertebral artery. Visualized right subclavian artery widely patent. Right carotid system: Right CCA tortuous proximally. Scattered atheromatous irregularity within the right CCA without hemodynamically significant stenosis. Mix concentric plaque about the right bifurcation/proximal right ICA. Resultant stenosis of up to 65-70% by NASCET criteria. Right ICA otherwise widely patent distally to the skull base without stenosis, dissection, or occlusion. Left carotid system: Atheromatous plaque at the origin of the left CCA with short-segment 50% stenosis. Additional moderate approximate 50% diffuse narrowing seen just distally within the proximal left CCA (series 8, image 293). Left CCA otherwise patent to the bifurcation without flow-limiting stenosis. Mild plaque about the left bifurcation and proximal left ICA without hemodynamically significant stenosis. Left ICA otherwise patent distally to the skull base without stenosis, dissection or occlusion. Vertebral arteries: Both vertebral arteries arise from the subclavian arteries. Atheromatous plaque at the origin of the vertebral arteries bilaterally without high-grade stenosis. Vertebral arteries mildly irregular but widely patent distally to the skull base without stenosis, dissection or occlusion. Skeleton: No acute osseous finding. No discrete lytic or blastic osseous lesions. Mild to moderate cervical spondylolysis noted at C5-6. Other neck: No other acute soft tissue abnormality within the neck. No mass lesion or adenopathy. Upper chest: Visualized upper chest demonstrates no acute finding. Subpleural fibrotic changes noted within the partially visualized lungs. Review of the MIP images confirms the above findings CTA HEAD FINDINGS Anterior circulation: Petrous segments patent bilaterally. Scattered atheromatous plaque within the  cavernous/supraclinoid ICAs bilaterally, left worse than right. Resultant mild to moderate narrowing at the para clinoid right ICA. There is a short-segment fairly severe stenosis at the supraclinoid left ICA (series 8, image 108). ICA termini well perfused. Right A1 widely patent. Left A1 hypoplastic and/or absent. Normal anterior communicating artery. Multifocal atheromatous irregularity with stenoses seen involving the ACAs bilaterally. Both ICAs are perfused to their distal aspects. Irregular moderate approximate 50% stenosis seen involving the proximal right M1 segment (series 8, image 106). Area of involvement measures approximately 7 mm in length. Right M1 patent distally. Normal right MCA bifurcation. Distal right MCA branches are well perfused, although demonstrate extensive small vessel atheromatous irregularity. Left M1 irregular with short-segment moderate stenosis at its mid aspect. Negative left MCA bifurcation. Extensive atheromatous irregularity with multifocal stenoses seen involving the distal left MCA branches. Specific note made of a short-segment severe proximal left M2 stenosis (series 8, image 100). Posterior circulation: Right vertebral artery widely patent to the  vertebrobasilar junction without significant stenosis. Patent right PICA. Short-segment severe stenosis seen involving the mid left V4 segment just prior to the takeoff of the left PICA (series 8, image 138). Left PICA patent. Additional short-segment moderate stenosis at the distal left V4 segment just prior to the vertebrobasilar junction. Multifocal atheromatous or layering 80 seen involving the basilar artery with associated mild narrowing at its mid aspect. Superior cerebral arteries patent bilaterally. Both PCAs primarily supplied via the basilar. PCAs are fairly diseased with extensive atheromatous irregularity and moderate to severe multifocal stenoses bilaterally. There is an apparent 2 mm focal outpouching arising from the  mid left P2 segment, suspicious for a small aneurysm (series 9, image 108). Venous sinuses: Patent. Anatomic variants: None significant. Review of the MIP images confirms the above findings IMPRESSION: 1. Negative CTA for large vessel occlusion. 2. Extensive mixed plaque about the origin of the right ICA with associated stenosis of up to 65-70% by NASCET criteria. 3. Multifocal moderate 50% stenoses seen involving the origin and proximal left common carotid artery as above. 4. Extensive atheromatous disease throughout the intracranial circulation with multifocal moderate to severe stenoses as above. Notable findings include a severe stenosis at the supraclinoid left ICA, moderate proximal right M1 and mid left M1 stenoses, with moderate to severe multifocal left V4 stenoses. Both PCAs are heavily diseased with extensive atheromatous irregularity. 5. 2 mm focal outpouching arising from the right PCA as above, suspicious for a small aneurysm. Electronically Signed   By: Rise Mu M.D.   On: 04/19/2019 04:47   DG Chest 2 View  Result Date: 04/19/2019 CLINICAL DATA:  Left-sided weakness, expressive aphasia and confusion for the past 2 days. EXAM: CHEST - 2 VIEW COMPARISON:  08/24/2012 FINDINGS: Grossly unchanged cardiac silhouette and mediastinal contours with atherosclerotic plaque within the thoracic aorta. There is persistent thickening the right paratracheal stripe presumably secondary to prominent vasculature. Lungs remain hyperexpanded with flattening of the diaphragms and diffuse slightly nodular thickening of the pulmonary interstitium. No discrete focal airspace opacities. No pleural effusion or pneumothorax. No evidence of edema. Old/healed fractures involving the posterolateral aspect of the left ninth and tenth ribs. No acute osseous abnormalities. IMPRESSION: 1. Similar findings of lung hyperexpansion and chronic bronchitic change without superimposed acute cardiopulmonary disease. 2.   Aortic Atherosclerosis (ICD10-I70.0). Electronically Signed   By: Simonne Come M.D.   On: 04/19/2019 08:14   CT HEAD WO CONTRAST  Result Date: 04/18/2019 CLINICAL DATA:  Focal neural deficit for more than 6 hours suspected stroke, history hypertension, former smoker EXAM: CT HEAD WITHOUT CONTRAST TECHNIQUE: Contiguous axial images were obtained from the base of the skull through the vertex without intravenous contrast. Sagittal and coronal MPR images reconstructed from axial data set. COMPARISON:  08/24/2012 FINDINGS: Brain: Generalized atrophy. Normal ventricular morphology. No midline shift or mass effect. Small vessel chronic ischemic changes of deep cerebral white matter. Old BILATERAL caudate lacunar infarcts. Old BILATERAL basal ganglia lacunar infarcts with extension on LEFT into periventricular white matter in the LEFT frontal lobe. No intracranial hemorrhage, mass lesion or evidence of acute infarction. No extra-axial fluid collections. Vascular: Atherosclerotic calcification of internal carotid arteries at skull base Skull: Intact Sinuses/Orbits: Clear Other: N/A IMPRESSION: Atrophy with small vessel chronic ischemic changes of deep cerebral white matter. Old lacunar infarcts as above. No acute intracranial abnormalities. Electronically Signed   By: Ulyses Southward M.D.   On: 04/18/2019 14:40   CT ANGIO NECK W OR WO CONTRAST  Result Date: 04/19/2019 CLINICAL  DATA:  Initial evaluation for acute stroke. EXAM: CT ANGIOGRAPHY HEAD AND NECK TECHNIQUE: Multidetector CT imaging of the head and neck was performed using the standard protocol during bolus administration of intravenous contrast. Multiplanar CT image reconstructions and MIPs were obtained to evaluate the vascular anatomy. Carotid stenosis measurements (when applicable) are obtained utilizing NASCET criteria, using the distal internal carotid diameter as the denominator. CONTRAST:  80mL OMNIPAQUE IOHEXOL 350 MG/ML SOLN COMPARISON:  Prior CT and  MRI from 04/18/2019. FINDINGS: CTA NECK FINDINGS Aortic arch: Visualized aortic arch of normal caliber with normal 3 vessel morphology. Moderate atherosclerotic change seen about the aortic arch and origin of the great vessels. Atheromatous irregularity with stenoses measuring up to 30% seen at the mid left subclavian artery beyond the takeoff of the left vertebral artery. Visualized right subclavian artery widely patent. Right carotid system: Right CCA tortuous proximally. Scattered atheromatous irregularity within the right CCA without hemodynamically significant stenosis. Mix concentric plaque about the right bifurcation/proximal right ICA. Resultant stenosis of up to 65-70% by NASCET criteria. Right ICA otherwise widely patent distally to the skull base without stenosis, dissection, or occlusion. Left carotid system: Atheromatous plaque at the origin of the left CCA with short-segment 50% stenosis. Additional moderate approximate 50% diffuse narrowing seen just distally within the proximal left CCA (series 8, image 293). Left CCA otherwise patent to the bifurcation without flow-limiting stenosis. Mild plaque about the left bifurcation and proximal left ICA without hemodynamically significant stenosis. Left ICA otherwise patent distally to the skull base without stenosis, dissection or occlusion. Vertebral arteries: Both vertebral arteries arise from the subclavian arteries. Atheromatous plaque at the origin of the vertebral arteries bilaterally without high-grade stenosis. Vertebral arteries mildly irregular but widely patent distally to the skull base without stenosis, dissection or occlusion. Skeleton: No acute osseous finding. No discrete lytic or blastic osseous lesions. Mild to moderate cervical spondylolysis noted at C5-6. Other neck: No other acute soft tissue abnormality within the neck. No mass lesion or adenopathy. Upper chest: Visualized upper chest demonstrates no acute finding. Subpleural fibrotic  changes noted within the partially visualized lungs. Review of the MIP images confirms the above findings CTA HEAD FINDINGS Anterior circulation: Petrous segments patent bilaterally. Scattered atheromatous plaque within the cavernous/supraclinoid ICAs bilaterally, left worse than right. Resultant mild to moderate narrowing at the para clinoid right ICA. There is a short-segment fairly severe stenosis at the supraclinoid left ICA (series 8, image 108). ICA termini well perfused. Right A1 widely patent. Left A1 hypoplastic and/or absent. Normal anterior communicating artery. Multifocal atheromatous irregularity with stenoses seen involving the ACAs bilaterally. Both ICAs are perfused to their distal aspects. Irregular moderate approximate 50% stenosis seen involving the proximal right M1 segment (series 8, image 106). Area of involvement measures approximately 7 mm in length. Right M1 patent distally. Normal right MCA bifurcation. Distal right MCA branches are well perfused, although demonstrate extensive small vessel atheromatous irregularity. Left M1 irregular with short-segment moderate stenosis at its mid aspect. Negative left MCA bifurcation. Extensive atheromatous irregularity with multifocal stenoses seen involving the distal left MCA branches. Specific note made of a short-segment severe proximal left M2 stenosis (series 8, image 100). Posterior circulation: Right vertebral artery widely patent to the vertebrobasilar junction without significant stenosis. Patent right PICA. Short-segment severe stenosis seen involving the mid left V4 segment just prior to the takeoff of the left PICA (series 8, image 138). Left PICA patent. Additional short-segment moderate stenosis at the distal left V4 segment just prior to the  vertebrobasilar junction. Multifocal atheromatous or layering 80 seen involving the basilar artery with associated mild narrowing at its mid aspect. Superior cerebral arteries patent bilaterally. Both  PCAs primarily supplied via the basilar. PCAs are fairly diseased with extensive atheromatous irregularity and moderate to severe multifocal stenoses bilaterally. There is an apparent 2 mm focal outpouching arising from the mid left P2 segment, suspicious for a small aneurysm (series 9, image 108). Venous sinuses: Patent. Anatomic variants: None significant. Review of the MIP images confirms the above findings IMPRESSION: 1. Negative CTA for large vessel occlusion. 2. Extensive mixed plaque about the origin of the right ICA with associated stenosis of up to 65-70% by NASCET criteria. 3. Multifocal moderate 50% stenoses seen involving the origin and proximal left common carotid artery as above. 4. Extensive atheromatous disease throughout the intracranial circulation with multifocal moderate to severe stenoses as above. Notable findings include a severe stenosis at the supraclinoid left ICA, moderate proximal right M1 and mid left M1 stenoses, with moderate to severe multifocal left V4 stenoses. Both PCAs are heavily diseased with extensive atheromatous irregularity. 5. 2 mm focal outpouching arising from the right PCA as above, suspicious for a small aneurysm. Electronically Signed   By: Rise Mu M.D.   On: 04/19/2019 04:47   MR BRAIN WO CONTRAST  Result Date: 04/18/2019 CLINICAL DATA:  Suspected stroke, left-sided weakness. EXAM: MRI HEAD WITHOUT CONTRAST TECHNIQUE: Multiplanar, multiecho pulse sequences of the brain and surrounding structures were obtained without intravenous contrast. COMPARISON:  Head CT 04/18/2019, head CT 08/24/2012 FINDINGS: Brain: Acute infarct within the right corona radiata measuring 0.8 x 1.4 x 1.0 cm (series 3, image 34) (series 4, image 17). Corresponding T2/FLAIR hyperintensity at this site. No evidence of intracranial mass. No midline shift or extra-axial fluid collection. Redemonstrated are multiple chronic lacunar infarcts within the left front lobe periventricular  white matter, as well as bilateral basal ganglia and thalami. There is a background of otherwise moderate chronic small vessel ischemic disease. There are several chronic microhemorrhages, some supratentorial as well as within the bilateral dentate nuclei. Vascular: Flow voids maintained within the proximal large arterial vessels. Skull and upper cervical spine: No focal marrow lesion Sinuses/Orbits: Visualized orbits demonstrate no acute abnormality. Minimal ethmoid sinus mucosal thickening. Trace fluid within bilateral mastoid air cells. IMPRESSION: 1.4 cm acute infarct within the right corona radiata. Redemonstrated are multiple chronic lacunar infarcts within the left frontal lobe periventricular white matter, as well as bilateral basal ganglia and thalami. Background of otherwise moderate chronic small vessel ischemic disease. 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 Does the need for close, 24 hr/day medical supervision in concert with the patient's rehab needs make it unreasonable for this patient to be served in a less intensive setting? Yes 2. Co-Morbidities requiring supervision/potential complications: migraine h/a's, depression, htn, recent weight loss 3. Due to bladder management, bowel management, safety, skin/wound care, disease management, medication administration, pain management and patient education, does the patient require 24 hr/day rehab nursing? Yes 4. Does the patient require coordinated care of a physician, rehab nurse, PT, OT, and potentially SLP  to address physical and functional deficits in the context of the above medical diagnosis(es)? Yes Addressing deficits in the following  areas:  balance, endurance, locomotion, strength, transferring, bowel/bladder control, bathing, dressing, feeding, grooming, toileting, speech and psychosocial support 5. Can the patient actively participate in an intensive therapy program of at least 3 hrs of therapy per day at least 5 days per week? Yes 6. The potential for patient to make measurable gains while on inpatient rehab is excellent 7. Anticipated functional outcomes upon discharge from inpatient rehab are supervision and min assist  with PT, supervision and min assist with OT, modified independent with SLP. 8. Estimated rehab length of stay to reach the above functional goals is: 14-18 days 9. Anticipated discharge destination: Home 10. Overall Rehab/Functional Prognosis: excellent  RECOMMENDATIONS: This patient's condition is appropriate for continued rehabilitative care in the following setting: CIR Patient has agreed to participate in recommended program. Yes and Potentially Note that insurance prior authorization may be required for reimbursement for recommended care.  Comment: Rehab Admissions Coordinator to follow up.  Thanks,  Ranelle OysterZachary T. Jameelah Watts, MD, FAAPMR  I have personally reviewed this patient's chart. Additionally, I have examined pertinent labs and radiographic images. I have reviewed and concur with the physician assistant's documentation above.    Mcarthur RossettiDaniel J Angiulli, PA-C 04/19/2019

## 2019-04-19 NOTE — Evaluation (Signed)
Occupational Therapy Evaluation Patient Details Name: Patricia Stanley MRN: [id number] DOB: 1946-04-10 Today's Date: 04/19/2019    History of Present Illness Patricia Stanley is a 73 y.o. female with medical history significant of hypertension, migraine headaches, depression, GERD being brought to the hospital via EMS for evaluation of left-sided weakness. In the ER Brain MRI showing a 1.4 cm acute infarct within the right corona radiata.   Clinical Impression   Patient is a 73 year old female that lives with her family in a two level home, can stay main level and with 3 steps to enter. Per chart review patient has not left the house in over a year, ambulated without AD and required assist on/off shower chair. Currently patient requiring min A seated in recliner chair for trunk mobility + control and anticipate requiring increased assist in unsupported seating position. Patient leaning R lateral and posteriorly, fatigues easily when sitting upright without support of recliner. Patient reports she is left handed, with L UE hemiparesis, decreased gross and fine motor control patient performing grooming/hygiene and self feeding tasks with R UE. Patient required set up assistance to open containers, hold onto kidney basin, cutting up her food. Due to deficits listed below recommend continued acute OT services during hospitalization.     Follow Up Recommendations  CIR;Supervision/Assistance - 24 hour    Equipment Recommendations  Other (comment)(to be determined)    Recommendations for Other Services Rehab consult     Precautions / Restrictions Precautions Precautions: Fall Precaution Comments: L hemi Restrictions Weight Bearing Restrictions: No      Mobility Bed Mobility        General bed mobility comments: patient seated in chair upon arrival  Transfers        General transfer comment: not formally assessed this session, anticipate x2 assistance for safety due to hemiparesis  and poor trunk control in sitting.    Balance Overall balance assessment: Needs assistance Sitting-balance support: Feet supported;Single extremity supported Sitting balance-Leahy Scale: Poor Sitting balance - Comments: pt requiring min A for trunk control when not supported by back of recliner Postural control: Right lateral lean;Posterior lean                            ADL either performed or assessed with clinical judgement   ADL Overall ADL's : Needs assistance/impaired Eating/Feeding: Set up;Sitting Eating/Feeding Details (indicate cue type and reason): assist cutting up food, opening containers as patient is L hand dominant and having to use mainly R hand for self care tasks Grooming: Oral care;Wash/dry face;Sitting;Minimal assistance Grooming Details (indicate cue type and reason): set up initially, min A for trunk control and to correct posture in order to spit into basin Upper Body Bathing: Sitting;Moderate assistance Upper Body Bathing Details (indicate cue type and reason): due to deficits in L UE Lower Body Bathing: Sitting/lateral leans;Total assistance Lower Body Bathing Details (indicate cue type and reason): decreased trunk control Upper Body Dressing : Moderate assistance;Sitting Upper Body Dressing Details (indicate cue type and reason): due to limited trunk control and patient limited use of dominant hand Lower Body Dressing: Total assistance;Sitting/lateral leans Lower Body Dressing Details (indicate cue type and reason): limited trunk control, limited use of L UE Toilet Transfer: +2 for physical assistance;+2 for safety/equipment;Stand-pivot;BSC Toilet Transfer Details (indicate cue type and reason): did not attempt this session as patient's breakfast arrived, anticipate assist of 2 for transfers for safety as patient has limited core stabilization  Toileting- Clothing Manipulation and Hygiene: Total assistance;Sitting/lateral lean               Vision Patient Visual Report: No change from baseline Additional Comments: pt denies any blurred or double vision, far and near sighted distance intact (able to read signs in room and menu)     Perception Perception Comments: patient crossing midline during grooming/hygiene tasks. able to appropriately use and identify objects.    Praxis Praxis Praxis-Other Comments: no praxis deficits observed this session    Pertinent Vitals/Pain Pain Assessment: No/denies pain     Hand Dominance Left   Extremity/Trunk Assessment Upper Extremity Assessment Upper Extremity Assessment: LUE deficits/detail LUE Deficits / Details: grip 2/5, elbow flexion 3-/5, shld flexion with 2+/5, sensation deficit LUE Sensation: decreased light touch;decreased proprioception LUE Coordination: decreased gross motor;decreased fine motor   Lower Extremity Assessment Lower Extremity Assessment: defer to PT     Cervical / Trunk Assessment Cervical / Trunk Assessment: Kyphotic   Communication Communication Communication: Expressive difficulties   Cognition Arousal/Alertness: Awake/alert Behavior During Therapy: WFL for tasks assessed/performed Overall Cognitive Status: Impaired/Different from baseline Area of Impairment: Following commands;Safety/judgement;Awareness;Problem solving                       Following Commands: Follows one step commands with increased time;Follows one step commands consistently;Follows multi-step commands inconsistently Safety/Judgement: Decreased awareness of safety;Decreased awareness of deficits Awareness: Intellectual Problem Solving: Slow processing;Decreased initiation;Difficulty sequencing;Requires verbal cues;Requires tactile cues     General Comments  VSS            Home Living Family/patient expects to be discharged to:: Private residence Living Arrangements: Spouse/significant other Available Help at Discharge: Family;Available 24  hours/day Type of Home: House Home Access: Stairs to enter Entergy CorporationEntrance Stairs-Number of Steps: 4 Entrance Stairs-Rails: Can reach both Home Layout: One level     Bathroom Shower/Tub: Chief Strategy OfficerTub/shower unit   Bathroom Toilet: Standard     Home Equipment: Environmental consultantWalker - 2 wheels;Bedside commode;Shower seat;Grab bars - tub/shower;Hand held shower head          Prior Functioning/Environment Level of Independence: Needs assistance  Gait / Transfers Assistance Needed: didn't use AD ADL's / Homemaking Assistance Needed: family assist pt into shower and on the chair,  Communication / Swallowing Assistance Needed: spouse states her mind isn't as fast as it used to be, she's had a hard time explaining things to me Comments: spouse states she hasn't been out of the house in a  year        OT Problem List: Decreased strength;Decreased range of motion;Decreased activity tolerance;Impaired balance (sitting and/or standing);Decreased coordination;Decreased cognition;Decreased safety awareness;Decreased knowledge of use of DME or AE;Decreased knowledge of precautions;Impaired sensation;Impaired tone;Impaired UE functional use      OT Treatment/Interventions: Self-care/ADL training;Therapeutic exercise;Neuromuscular education;DME and/or AE instruction;Therapeutic activities;Cognitive remediation/compensation;Patient/family education;Balance training    OT Goals(Current goals can be found in the care plan section) Acute Rehab OT Goals Patient Stated Goal: didn't state OT Goal Formulation: With patient Time For Goal Achievement: 05/03/19 Potential to Achieve Goals: Good  OT Frequency: Min 2X/week    AM-PAC OT "6 Clicks" Daily Activity     Outcome Measure Help from another person eating meals?: None Help from another person taking care of personal grooming?: A Little Help from another person toileting, which includes using toliet, bedpan, or urinal?: Total Help from another person bathing (including  washing, rinsing, drying)?: A Lot Help from another person to put on and taking off regular  upper body clothing?: A Lot Help from another person to put on and taking off regular lower body clothing?: Total 6 Click Score: 13   End of Session Nurse Communication: Other (comment)(assist with ADLs)  Activity Tolerance: Patient tolerated treatment well Patient left: in chair;with call bell/phone within reach;with chair alarm set  OT Visit Diagnosis: Unsteadiness on feet (R26.81);Other abnormalities of gait and mobility (R26.89);Muscle weakness (generalized) (M62.81);Other symptoms and signs involving cognitive function;Other symptoms and signs involving the nervous system (R29.898);Hemiplegia and hemiparesis Hemiplegia - Right/Left: Left Hemiplegia - dominant/non-dominant: Dominant Hemiplegia - caused by: Cerebral infarction                Time: 7026-3785 OT Time Calculation (min): 23 min Charges:  OT General Charges $OT Visit: 1 Visit OT Evaluation $OT Eval Moderate Complexity: 1 Mod OT Treatments $Self Care/Home Management : 8-22 mins  Myrtie Neither OT OT office: [phone]  Carmelia Roller 04/19/2019, 12:45 PM

## 2019-04-20 DIAGNOSIS — R634 Abnormal weight loss: Secondary | ICD-10-CM

## 2019-04-20 DIAGNOSIS — I6521 Occlusion and stenosis of right carotid artery: Secondary | ICD-10-CM

## 2019-04-20 DIAGNOSIS — F329 Major depressive disorder, single episode, unspecified: Secondary | ICD-10-CM

## 2019-04-20 DIAGNOSIS — G43709 Chronic migraine without aura, not intractable, without status migrainosus: Secondary | ICD-10-CM

## 2019-04-20 MED ORDER — TOPIRAMATE 25 MG PO TABS
100.0000 mg | ORAL_TABLET | Freq: Every day | ORAL | Status: DC
  Administered 2019-04-20: 100 mg via ORAL
  Filled 2019-04-20: qty 1

## 2019-04-20 NOTE — Progress Notes (Signed)
73 year old female evaluated earlier today for symptomatic right carotid stenosis.  We had recommended right carotid endarterectomy but patient did not want surgery and deferred surgical treatment.  I was called this evening by patient's nurse stating that she had changed her mind and wants to proceed with surgery.  She is now posted for right carotid endarterectomy tomorrow.  Please have the patient n.p.o. after midnight.  I went up and talk to her and her family again and answered all their questions.  Marty Heck, MD [facility] Office: [phone] Pager: [place]

## 2019-04-20 NOTE — Progress Notes (Signed)
Physical Therapy Treatment Patient Details Name: Patricia Stanley MRN: [id number] DOB: Mar 09, 1946 Today's Date: 04/20/2019    History of Present Illness Patricia Stanley is a 73 y.o. female with medical history significant of hypertension, migraine headaches, depression, GERD being brought to the hospital via EMS for evaluation of left-sided weakness. In the ER Brain MRI showing a 1.4 cm acute infarct within the right corona radiata.  Per vascular note pt declined carotid endarectomy ; however, pt reporting that she has changed her mind (RN was notified).    PT Comments    Pt demonstrating progress with transfers and good motivation to work with therapy.  Required moderate assistance at times, but was able to stand and complete pre-gait activities with assist.  Required skilled therapy for cues, joint protection, and education.  Cont POC   Follow Up Recommendations  CIR     Equipment Recommendations  Other (comment)(to be assessed at next venue)    Recommendations for Other Services [facility] consult     Precautions / Restrictions Precautions Precautions: Fall Precaution Comments: L hemi Restrictions Weight Bearing Restrictions: No    Mobility  Bed Mobility Overal bed mobility: Needs Assistance Bed Mobility: Sidelying to Sit   Sidelying to sit: Mod assist       General bed mobility comments: cues for technique; min A for L LE, mod A for trunk;  cued to push with UE to scoot forward (L UE blocked and supported to assist)  Transfers Overall transfer level: Needs assistance Equipment used: 1 person hand held assist Transfers: Sit to/from [employer] Sit to Stand: Mod assist;Min assist Stand pivot transfers: Mod assist       General transfer comment: Performed sit to stand from bed with mod A; performed pivot to chair with L LE blocked (L ankle with tendency to supinate so did not attempt walking); performed sit to stand from chair x 2 with cues for technique  and mod A (L LE guarded)  Ambulation/Gait             General Gait Details: unable this date   Stairs             Wheelchair Mobility    Modified Rankin (Stroke Patients Only)       Balance Overall balance assessment: Needs assistance Sitting-balance support: Feet supported;Single extremity supported Sitting balance-Leahy Scale: Fair Sitting balance - Comments: Pt can maintain balance for short period of time (<1 minute) but needs cues (keep R LE down, lean forward) -Worked on balance at EOB for 3 minutes with L UE supported in weight bearing position and pt performed 10 reps LAQ and ankle pumps -Worked on balance at edge of chair for 4 minutes: static balance with cues for posture and to keep R LE on ground, added turning head for challenge, lifting arms to low guard position instead of on knees, and reaching 6 " outside BOS in all directions with R UE.  Required cues and min A at times to recover balance.     Standing balance-Leahy Scale: Poor Standing balance comment: required R UE on RW and min A from PT  -Performed standing balance/pre-gait activities with R UE supported on walker, PT providing guarding/support under buttock and guarding L knee and ankle.  Performed 10 x mini squats, weight shifting, and 5 reps lifting R LE with L knee guarded and not locked in extension  Cognition Arousal/Alertness: Awake/alert Behavior During Therapy: WFL for tasks assessed/performed Overall Cognitive Status: Impaired/Different from baseline Area of Impairment: Following commands;Safety/judgement;Awareness;Problem solving                       Following Commands: Follows one step commands with increased time;Follows one step commands consistently;Follows multi-step commands inconsistently Safety/Judgement: Decreased awareness of safety;Decreased awareness of deficits   Problem Solving: Slow processing;Decreased initiation;Difficulty  sequencing;Requires verbal cues;Requires tactile cues        Exercises      General Comments General comments (skin integrity, edema, etc.): VSS   Educated pt and family on CVA recovery including: benefits of weightbearing on joints, use of hemiplegic side as able, and safety for L UE (not pulling on joint and not resting in internally rotated/flexed position)      Pertinent Vitals/Pain Pain Assessment: No/denies pain    Home Living                      Prior Function            PT Goals (current goals can now be found in the care plan section) Progress towards PT goals: Progressing toward goals    Frequency    Min 4X/week      PT Plan Current plan remains appropriate    Co-evaluation              AM-PAC PT "6 Clicks" Mobility   Outcome Measure  Help needed turning from your back to your side while in a flat bed without using bedrails?: A Little Help needed moving from lying on your back to sitting on the side of a flat bed without using bedrails?: A Lot Help needed moving to and from a bed to a chair (including a wheelchair)?: A Lot Help needed standing up from a chair using your arms (e.g., wheelchair or bedside chair)?: A Lot Help needed to walk in hospital room?: Total Help needed climbing 3-5 steps with a railing? : Total 6 Click Score: 11    End of Session Equipment Utilized During Treatment: Gait belt Activity Tolerance: Patient tolerated treatment well Patient left: in chair;with call bell/phone within reach;with chair alarm set;with family/visitor present Nurse Communication: Mobility status PT Visit Diagnosis: Unsteadiness on feet (R26.81);Other abnormalities of gait and mobility (R26.89);Repeated falls (R29.6);Difficulty in walking, not elsewhere classified (R26.2);Hemiplegia and hemiparesis;Muscle weakness (generalized) (M62.81) Hemiplegia - Right/Left: Left Hemiplegia - dominant/non-dominant: Dominant Hemiplegia - caused by: Cerebral  infarction     Time: 9604-5409 PT Time Calculation (min) (ACUTE ONLY): 34 min  Charges:  $Therapeutic Activity: 8-22 mins $Neuromuscular Re-education: 8-22 mins                     Maggie Font, PT [facility] Pager [phone] Patricia Stanley [phone] Patricia Stanley [phone]    Patricia Stanley 04/20/2019, 4:16 PM

## 2019-04-20 NOTE — Progress Notes (Signed)
STROKE TEAM PROGRESS NOTE   INTERVAL HISTORY Husband at bedside. She is lying in bed, stated that she has not eaten anything today, even no breakfast. Her BP in 140s, on IVF. LUE 3/5 and LLE 4/5 similar to yesterday. Dr. Chestine Spore from [facility] came to see her and will discuss about right CEA.    Vitals:   04/19/19 1426 04/19/19 1700 04/19/19 2132 04/20/19 0416  BP: (!) 107/55 (!) 123/58 (!) 163/66 (!) 148/71  Pulse: 62 70 69   Resp: 15 13    Temp: (!) 97.4 F (36.3 C)  (!) 97.3 F (36.3 C) (!) 97.4 F (36.3 C)  TempSrc: Oral  Oral Oral  SpO2: 99% 99% 100%   Weight:    44.2 kg  Height:        CBC:  Recent Labs  Lab 04/18/19 1234  WBC 3.8*  NEUTROABS 1.9  HGB 12.6  HCT 38.8  MCV 95.8  PLT 120*    Basic Metabolic Panel:  Recent Labs  Lab 04/18/19 1234  NA 144  K 3.5  CL 114*  CO2 22  GLUCOSE 88  BUN 15  CREATININE 1.01*  CALCIUM 8.9   Lipid Panel:     Component Value Date/Time   CHOL 152 04/19/2019 0414   TRIG 58 04/19/2019 0414   HDL 53 04/19/2019 0414   CHOLHDL 2.9 04/19/2019 0414   VLDL 12 04/19/2019 0414   LDLCALC 87 04/19/2019 0414   HgbA1c:  Lab Results  Component Value Date   HGBA1C 5.8 (H) 04/19/2019   Urine Drug Screen: No results found for: LABOPIA, COCAINSCRNUR, LABBENZ, AMPHETMU, THCU, LABBARB  Alcohol Level     Component Value Date/Time   ETH <10 04/18/2019 1234    IMAGING CT ANGIO HEAD W OR WO CONTRAST  Result Date: 04/19/2019 CLINICAL DATA:  Initial evaluation for acute stroke. EXAM: CT ANGIOGRAPHY HEAD AND NECK TECHNIQUE: Multidetector CT imaging of the head and neck was performed using the standard protocol during bolus administration of intravenous contrast. Multiplanar CT image reconstructions and MIPs were obtained to evaluate the vascular anatomy. Carotid stenosis measurements (when applicable) are obtained utilizing NASCET criteria, using the distal internal carotid diameter as the denominator. CONTRAST:  80mL OMNIPAQUE IOHEXOL 350  MG/ML SOLN COMPARISON:  Prior CT and MRI from 04/18/2019. FINDINGS: CTA NECK FINDINGS Aortic arch: Visualized aortic arch of normal caliber with normal 3 vessel morphology. Moderate atherosclerotic change seen about the aortic arch and origin of the great vessels. Atheromatous irregularity with stenoses measuring up to 30% seen at the mid left subclavian artery beyond the takeoff of the left vertebral artery. Visualized right subclavian artery widely patent. Right carotid system: Right CCA tortuous proximally. Scattered atheromatous irregularity within the right CCA without hemodynamically significant stenosis. Mix concentric plaque about the right bifurcation/proximal right ICA. Resultant stenosis of up to 65-70% by NASCET criteria. Right ICA otherwise widely patent distally to the skull base without stenosis, dissection, or occlusion. Left carotid system: Atheromatous plaque at the origin of the left CCA with short-segment 50% stenosis. Additional moderate approximate 50% diffuse narrowing seen just distally within the proximal left CCA (series 8, image 293). Left CCA otherwise patent to the bifurcation without flow-limiting stenosis. Mild plaque about the left bifurcation and proximal left ICA without hemodynamically significant stenosis. Left ICA otherwise patent distally to the skull base without stenosis, dissection or occlusion. Vertebral arteries: Both vertebral arteries arise from the subclavian arteries. Atheromatous plaque at the origin of the vertebral arteries bilaterally without high-grade stenosis. Vertebral  arteries mildly irregular but widely patent distally to the skull base without stenosis, dissection or occlusion. Skeleton: No acute osseous finding. No discrete lytic or blastic osseous lesions. Mild to moderate cervical spondylolysis noted at C5-6. Other neck: No other acute soft tissue abnormality within the neck. No mass lesion or adenopathy. Upper chest: Visualized upper chest demonstrates no  acute finding. Subpleural fibrotic changes noted within the partially visualized lungs. Review of the MIP images confirms the above findings CTA HEAD FINDINGS Anterior circulation: Petrous segments patent bilaterally. Scattered atheromatous plaque within the cavernous/supraclinoid ICAs bilaterally, left worse than right. Resultant mild to moderate narrowing at the para clinoid right ICA. There is a short-segment fairly severe stenosis at the supraclinoid left ICA (series 8, image 108). ICA termini well perfused. Right A1 widely patent. Left A1 hypoplastic and/or absent. Normal anterior communicating artery. Multifocal atheromatous irregularity with stenoses seen involving the ACAs bilaterally. Both ICAs are perfused to their distal aspects. Irregular moderate approximate 50% stenosis seen involving the proximal right M1 segment (series 8, image 106). Area of involvement measures approximately 7 mm in length. Right M1 patent distally. Normal right MCA bifurcation. Distal right MCA branches are well perfused, although demonstrate extensive small vessel atheromatous irregularity. Left M1 irregular with short-segment moderate stenosis at its mid aspect. Negative left MCA bifurcation. Extensive atheromatous irregularity with multifocal stenoses seen involving the distal left MCA branches. Specific note made of a short-segment severe proximal left M2 stenosis (series 8, image 100). Posterior circulation: Right vertebral artery widely patent to the vertebrobasilar junction without significant stenosis. Patent right PICA. Short-segment severe stenosis seen involving the mid left V4 segment just prior to the takeoff of the left PICA (series 8, image 138). Left PICA patent. Additional short-segment moderate stenosis at the distal left V4 segment just prior to the vertebrobasilar junction. Multifocal atheromatous or layering 80 seen involving the basilar artery with associated mild narrowing at its mid aspect. Superior  cerebral arteries patent bilaterally. Both PCAs primarily supplied via the basilar. PCAs are fairly diseased with extensive atheromatous irregularity and moderate to severe multifocal stenoses bilaterally. There is an apparent 2 mm focal outpouching arising from the mid left P2 segment, suspicious for a small aneurysm (series 9, image 108). Venous sinuses: Patent. Anatomic variants: None significant. Review of the MIP images confirms the above findings IMPRESSION: 1. Negative CTA for large vessel occlusion. 2. Extensive mixed plaque about the origin of the right ICA with associated stenosis of up to 65-70% by NASCET criteria. 3. Multifocal moderate 50% stenoses seen involving the origin and proximal left common carotid artery as above. 4. Extensive atheromatous disease throughout the intracranial circulation with multifocal moderate to severe stenoses as above. Notable findings include a severe stenosis at the supraclinoid left ICA, moderate proximal right M1 and mid left M1 stenoses, with moderate to severe multifocal left V4 stenoses. Both PCAs are heavily diseased with extensive atheromatous irregularity. 5. 2 mm focal outpouching arising from the right PCA as above, suspicious for a small aneurysm. Electronically Signed   By: Rise Mu M.D.   On: 04/19/2019 04:47   DG Chest 2 View  Result Date: 04/19/2019 CLINICAL DATA:  Left-sided weakness, expressive aphasia and confusion for the past 2 days. EXAM: CHEST - 2 VIEW COMPARISON:  08/24/2012 FINDINGS: Grossly unchanged cardiac silhouette and mediastinal contours with atherosclerotic plaque within the thoracic aorta. There is persistent thickening the right paratracheal stripe presumably secondary to prominent vasculature. Lungs remain hyperexpanded with flattening of the diaphragms and diffuse slightly nodular  thickening of the pulmonary interstitium. No discrete focal airspace opacities. No pleural effusion or pneumothorax. No evidence of edema.  Old/healed fractures involving the posterolateral aspect of the left ninth and tenth ribs. No acute osseous abnormalities. IMPRESSION: 1. Similar findings of lung hyperexpansion and chronic bronchitic change without superimposed acute cardiopulmonary disease. 2.  Aortic Atherosclerosis (ICD10-I70.0). Electronically Signed   By: Simonne Come M.D.   On: 04/19/2019 08:14   CT HEAD WO CONTRAST  Result Date: 04/18/2019 CLINICAL DATA:  Focal neural deficit for more than 6 hours suspected stroke, history hypertension, former smoker EXAM: CT HEAD WITHOUT CONTRAST TECHNIQUE: Contiguous axial images were obtained from the base of the skull through the vertex without intravenous contrast. Sagittal and coronal MPR images reconstructed from axial data set. COMPARISON:  08/24/2012 FINDINGS: Brain: Generalized atrophy. Normal ventricular morphology. No midline shift or mass effect. Small vessel chronic ischemic changes of deep cerebral white matter. Old BILATERAL caudate lacunar infarcts. Old BILATERAL basal ganglia lacunar infarcts with extension on LEFT into periventricular white matter in the LEFT frontal lobe. No intracranial hemorrhage, mass lesion or evidence of acute infarction. No extra-axial fluid collections. Vascular: Atherosclerotic calcification of internal carotid arteries at skull base Skull: Intact Sinuses/Orbits: Clear Other: N/A IMPRESSION: Atrophy with small vessel chronic ischemic changes of deep cerebral white matter. Old lacunar infarcts as above. No acute intracranial abnormalities. Electronically Signed   By: Ulyses Southward M.D.   On: 04/18/2019 14:40   CT ANGIO NECK W OR WO CONTRAST  Result Date: 04/19/2019 CLINICAL DATA:  Initial evaluation for acute stroke. EXAM: CT ANGIOGRAPHY HEAD AND NECK TECHNIQUE: Multidetector CT imaging of the head and neck was performed using the standard protocol during bolus administration of intravenous contrast. Multiplanar CT image reconstructions and MIPs were  obtained to evaluate the vascular anatomy. Carotid stenosis measurements (when applicable) are obtained utilizing NASCET criteria, using the distal internal carotid diameter as the denominator. CONTRAST:  17mL OMNIPAQUE IOHEXOL 350 MG/ML SOLN COMPARISON:  Prior CT and MRI from 04/18/2019. FINDINGS: CTA NECK FINDINGS Aortic arch: Visualized aortic arch of normal caliber with normal 3 vessel morphology. Moderate atherosclerotic change seen about the aortic arch and origin of the great vessels. Atheromatous irregularity with stenoses measuring up to 30% seen at the mid left subclavian artery beyond the takeoff of the left vertebral artery. Visualized right subclavian artery widely patent. Right carotid system: Right CCA tortuous proximally. Scattered atheromatous irregularity within the right CCA without hemodynamically significant stenosis. Mix concentric plaque about the right bifurcation/proximal right ICA. Resultant stenosis of up to 65-70% by NASCET criteria. Right ICA otherwise widely patent distally to the skull base without stenosis, dissection, or occlusion. Left carotid system: Atheromatous plaque at the origin of the left CCA with short-segment 50% stenosis. Additional moderate approximate 50% diffuse narrowing seen just distally within the proximal left CCA (series 8, image 293). Left CCA otherwise patent to the bifurcation without flow-limiting stenosis. Mild plaque about the left bifurcation and proximal left ICA without hemodynamically significant stenosis. Left ICA otherwise patent distally to the skull base without stenosis, dissection or occlusion. Vertebral arteries: Both vertebral arteries arise from the subclavian arteries. Atheromatous plaque at the origin of the vertebral arteries bilaterally without high-grade stenosis. Vertebral arteries mildly irregular but widely patent distally to the skull base without stenosis, dissection or occlusion. Skeleton: No acute osseous finding. No discrete lytic  or blastic osseous lesions. Mild to moderate cervical spondylolysis noted at C5-6. Other neck: No other acute soft tissue abnormality within the neck.  No mass lesion or adenopathy. Upper chest: Visualized upper chest demonstrates no acute finding. Subpleural fibrotic changes noted within the partially visualized lungs. Review of the MIP images confirms the above findings CTA HEAD FINDINGS Anterior circulation: Petrous segments patent bilaterally. Scattered atheromatous plaque within the cavernous/supraclinoid ICAs bilaterally, left worse than right. Resultant mild to moderate narrowing at the para clinoid right ICA. There is a short-segment fairly severe stenosis at the supraclinoid left ICA (series 8, image 108). ICA termini well perfused. Right A1 widely patent. Left A1 hypoplastic and/or absent. Normal anterior communicating artery. Multifocal atheromatous irregularity with stenoses seen involving the ACAs bilaterally. Both ICAs are perfused to their distal aspects. Irregular moderate approximate 50% stenosis seen involving the proximal right M1 segment (series 8, image 106). Area of involvement measures approximately 7 mm in length. Right M1 patent distally. Normal right MCA bifurcation. Distal right MCA branches are well perfused, although demonstrate extensive small vessel atheromatous irregularity. Left M1 irregular with short-segment moderate stenosis at its mid aspect. Negative left MCA bifurcation. Extensive atheromatous irregularity with multifocal stenoses seen involving the distal left MCA branches. Specific note made of a short-segment severe proximal left M2 stenosis (series 8, image 100). Posterior circulation: Right vertebral artery widely patent to the vertebrobasilar junction without significant stenosis. Patent right PICA. Short-segment severe stenosis seen involving the mid left V4 segment just prior to the takeoff of the left PICA (series 8, image 138). Left PICA patent. Additional  short-segment moderate stenosis at the distal left V4 segment just prior to the vertebrobasilar junction. Multifocal atheromatous or layering 80 seen involving the basilar artery with associated mild narrowing at its mid aspect. Superior cerebral arteries patent bilaterally. Both PCAs primarily supplied via the basilar. PCAs are fairly diseased with extensive atheromatous irregularity and moderate to severe multifocal stenoses bilaterally. There is an apparent 2 mm focal outpouching arising from the mid left P2 segment, suspicious for a small aneurysm (series 9, image 108). Venous sinuses: Patent. Anatomic variants: None significant. Review of the MIP images confirms the above findings IMPRESSION: 1. Negative CTA for large vessel occlusion. 2. Extensive mixed plaque about the origin of the right ICA with associated stenosis of up to 65-70% by NASCET criteria. 3. Multifocal moderate 50% stenoses seen involving the origin and proximal left common carotid artery as above. 4. Extensive atheromatous disease throughout the intracranial circulation with multifocal moderate to severe stenoses as above. Notable findings include a severe stenosis at the supraclinoid left ICA, moderate proximal right M1 and mid left M1 stenoses, with moderate to severe multifocal left V4 stenoses. Both PCAs are heavily diseased with extensive atheromatous irregularity. 5. 2 mm focal outpouching arising from the right PCA as above, suspicious for a small aneurysm. Electronically Signed   By: Rise Mu M.D.   On: 04/19/2019 04:47   MR BRAIN WO CONTRAST  Result Date: 04/18/2019 CLINICAL DATA:  Suspected stroke, left-sided weakness. EXAM: MRI HEAD WITHOUT CONTRAST TECHNIQUE: Multiplanar, multiecho pulse sequences of the brain and surrounding structures were obtained without intravenous contrast. COMPARISON:  Head CT 04/18/2019, head CT 08/24/2012 FINDINGS: Brain: Acute infarct within the right corona radiata measuring 0.8 x 1.4 x  1.0 cm (series 3, image 34) (series 4, image 17). Corresponding T2/FLAIR hyperintensity at this site. No evidence of intracranial mass. No midline shift or extra-axial fluid collection. Redemonstrated are multiple chronic lacunar infarcts within the left front lobe periventricular white matter, as well as bilateral basal ganglia and thalami. There is a background of otherwise moderate chronic small  vessel ischemic disease. There are several chronic microhemorrhages, some supratentorial as well as within the bilateral dentate nuclei. Vascular: Flow voids maintained within the proximal large arterial vessels. Skull and upper cervical spine: No focal marrow lesion Sinuses/Orbits: Visualized orbits demonstrate no acute abnormality. Minimal ethmoid sinus mucosal thickening. Trace fluid within bilateral mastoid air cells. IMPRESSION: 1.4 cm acute infarct within the right corona radiata. Redemonstrated are multiple chronic lacunar infarcts within the left frontal lobe periventricular white matter, as well as bilateral basal ganglia and thalami. Background of otherwise moderate chronic small vessel ischemic disease. Electronically Signed   By: Jackey Loge DO   On: 04/18/2019 17:32   ECHOCARDIOGRAM COMPLETE  Result Date: 04/19/2019   ECHOCARDIOGRAM REPORT   Patient Name:   IMAJEAN MCDERMID Date of Exam: 04/19/2019 Medical Rec #:  [id number]        Height:       61.5 in Accession #:    [id number]       Weight:       93.5 lb Date of Birth:  13-May-1945         BSA:          1.38 m Patient Age:    73 years         BP:           97/60 mmHg Patient Gender: F                HR:           56 bpm. Exam Location:  Inpatient Procedure: 2D Echo, Cardiac Doppler and Color Doppler Indications:    Stroke 434.91  History:        Patient has no prior history of Echocardiogram examinations.                 Stroke; Risk Factors:Hypertension and Former Smoker.  Sonographer:    Tonia Ghent RDCS Referring Phys: [id number] VASUNDHRA RATHORE  IMPRESSIONS  1. Left ventricular ejection fraction, by visual estimation, is 60 to 65%. The left ventricle has normal function. There is no left ventricular hypertrophy.  2. Left ventricular diastolic parameters are consistent with Grade II diastolic dysfunction (pseudonormalization).  3. The left ventricle has no regional wall motion abnormalities.  4. Global right ventricle has normal systolic function.The right ventricular size is normal. No increase in right ventricular wall thickness.  5. Left atrial size was normal.  6. Right atrial size was normal.  7. The mitral valve is normal in structure. No evidence of mitral valve regurgitation. No evidence of mitral stenosis.  8. The tricuspid valve is normal in structure. Tricuspid valve regurgitation is trivial.  9. The aortic valve is normal in structure. Aortic valve regurgitation is not visualized. No evidence of aortic valve sclerosis or stenosis. 10. The pulmonic valve was normal in structure. Pulmonic valve regurgitation is not visualized. 11. Normal pulmonary artery systolic pressure. 12. The inferior vena cava is normal in size with greater than 50% respiratory variability, suggesting right atrial pressure of 3 mmHg. FINDINGS  Left Ventricle: Left ventricular ejection fraction, by visual estimation, is 60 to 65%. The left ventricle has normal function. The left ventricle has no regional wall motion abnormalities. There is no left ventricular hypertrophy. Left ventricular diastolic parameters are consistent with Grade II diastolic dysfunction (pseudonormalization). Normal left atrial pressure. Right Ventricle: The right ventricular size is normal. No increase in right ventricular wall thickness. Global RV systolic function is has normal systolic function. The tricuspid regurgitant velocity is 1.74  m/s, and with an assumed right atrial pressure  of 3 mmHg, the estimated right ventricular systolic pressure is normal at 15.1 mmHg. Left Atrium: Left atrial size  was normal in size. Right Atrium: Right atrial size was normal in size Pericardium: There is no evidence of pericardial effusion. Mitral Valve: The mitral valve is normal in structure. No evidence of mitral valve regurgitation. No evidence of mitral valve stenosis by observation. Tricuspid Valve: The tricuspid valve is normal in structure. Tricuspid valve regurgitation is trivial. Aortic Valve: The aortic valve is normal in structure. Aortic valve regurgitation is not visualized. The aortic valve is structurally normal, with no evidence of sclerosis or stenosis. Pulmonic Valve: The pulmonic valve was normal in structure. Pulmonic valve regurgitation is not visualized. Pulmonic regurgitation is not visualized. Aorta: The aortic root, ascending aorta and aortic arch are all structurally normal, with no evidence of dilitation or obstruction. Venous: The inferior vena cava is normal in size with greater than 50% respiratory variability, suggesting right atrial pressure of 3 mmHg. IAS/Shunts: No atrial level shunt detected by color flow Doppler. There is no evidence of a patent foramen ovale. No ventricular septal defect is seen or detected. There is no evidence of an atrial septal defect.  LEFT VENTRICLE PLAX 2D LVIDd:         3.57 cm       Diastology LVIDs:         2.39 cm       LV e' lateral:   8.49 cm/s LV PW:         0.78 cm       LV E/e' lateral: 9.6 LV IVS:        0.78 cm       LV e' medial:    5.98 cm/s LVOT diam:     1.40 cm       LV E/e' medial:  13.6 LV SV:         33 ml LV SV Index:   24.77 LVOT Area:     1.54 cm  LV Volumes (MOD) LV area d, A2C:    21.40 cm LV area d, A4C:    20.40 cm LV area s, A2C:    12.20 cm LV area s, A4C:    11.60 cm LV major d, A2C:   6.47 cm LV major d, A4C:   6.42 cm LV major s, A2C:   5.38 cm LV major s, A4C:   5.45 cm LV vol d, MOD A2C: 58.5 ml LV vol d, MOD A4C: 54.0 ml LV vol s, MOD A2C: 23.1 ml LV vol s, MOD A4C: 21.0 ml LV SV MOD A2C:     35.4 ml LV SV MOD A4C:     54.0  ml LV SV MOD BP:      34.2 ml RIGHT VENTRICLE RV S prime:     12.80 cm/s TAPSE (M-mode): 2.1 cm LEFT ATRIUM             Index       RIGHT ATRIUM          Index LA diam:        2.70 cm 1.96 cm/m  RA Area:     8.86 cm LA Vol (A2C):   24.6 ml 17.89 ml/m RA Volume:   16.30 ml 11.85 ml/m LA Vol (A4C):   37.7 ml 27.41 ml/m LA Biplane Vol: 32.1 ml 23.34 ml/m  AORTIC VALVE LVOT Vmax:   106.00 cm/s LVOT Vmean:  71.200  cm/s LVOT VTI:    0.270 m  AORTA Ao Root diam: 2.70 cm MITRAL VALVE                        TRICUSPID VALVE MV Area (PHT): 2.95 cm             TR Peak grad:   12.1 mmHg MV PHT:        74.53 msec           TR Vmax:        174.00 cm/s MV Decel Time: 257 msec MV E velocity: 81.40 cm/s 103 cm/s  SHUNTS MV A velocity: 63.40 cm/s 70.3 cm/s Systemic VTI:  0.27 m MV E/A ratio:  1.28       1.5       Systemic Diam: 1.40 cm  Rachelle HoraMihai Croitoru MD Electronically signed by Thurmon FairMihai Croitoru MD Signature Date/Time: 04/19/2019/3:35:50 PM    Final    VAS US CAROTID  Result Date: 04/19/2019 Carotid Arterial Duplex Study Indications: Stenosis on CTA. Performing Technologist: Levada Schillingharlotte Bynum RDMS, RVT  Examination Guidelines: A complete evaluation includes B-mode imaging, spectral Doppler, color Doppler, and power Doppler as needed of all accessible portions of each vessel. Bilateral testing is considered an integral part of a complete examination. Limited examinations for reoccurring indications may be performed as noted.  Right Carotid Findings: +----------+-------+--------+--------+--------------------------------+--------+           PSV    EDV cm/sStenosisPlaque Description              Comments           cm/s                                                            +----------+-------+--------+--------+--------------------------------+--------+ CCA Prox  81     19                                                       +----------+-------+--------+--------+--------------------------------+--------+  CCA Mid                          diffuse and smooth                       +----------+-------+--------+--------+--------------------------------+--------+ CCA Distal84     24                                                       +----------+-------+--------+--------+--------------------------------+--------+ ICA Prox  166    45      40-59%  calcific, heterogenous and                                                diffuse                                  +----------+-------+--------+--------+--------------------------------+--------+  ICA Mid   161    52      40-59%  heterogenous and diffuse                 +----------+-------+--------+--------+--------------------------------+--------+ ICA Distal74     29                                                       +----------+-------+--------+--------+--------------------------------+--------+ ECA       178    16                                                       +----------+-------+--------+--------+--------------------------------+--------+ +----------+--------+-------+--------+-------------------+           PSV cm/sEDV cmsDescribeArm Pressure (mmHG) +----------+--------+-------+--------+-------------------+ Subclavian115                                        +----------+--------+-------+--------+-------------------+ +---------+--------+--+--------+--+---------+ VertebralPSV cm/s71EDV cm/s24Antegrade +---------+--------+--+--------+--+---------+  Left Carotid Findings: +----------+--------+--------+--------+---------------------+------------------+           PSV cm/sEDV cm/sStenosisPlaque Description   Comments           +----------+--------+--------+--------+---------------------+------------------+ CCA Prox  107     16                                                      +----------+--------+--------+--------+---------------------+------------------+ CCA Mid                                                 intimal thickening +----------+--------+--------+--------+---------------------+------------------+ CCA Distal86      18              diffuse, hyperechoic                                                      and smooth                              +----------+--------+--------+--------+---------------------+------------------+ ICA Prox  48      14              heterogenous,                                                             calcific and focal                      +----------+--------+--------+--------+---------------------+------------------+ ICA Distal50  19                                                      +----------+--------+--------+--------+---------------------+------------------+ ECA       147     16              calcific                                +----------+--------+--------+--------+---------------------+------------------+ +----------+--------+--------+--------+-------------------+           PSV cm/sEDV cm/sDescribeArm Pressure (mmHG) +----------+--------+--------+--------+-------------------+ Subclavian199                                         +----------+--------+--------+--------+-------------------+ +---------+--------+--+--------+--+---------+ VertebralPSV cm/s87EDV cm/s16Antegrade +---------+--------+--+--------+--+---------+  Summary: Right Carotid: Velocities in the right ICA are consistent with a 40-59%                stenosis. IMAGES 27-56 are of the right carotid system. Left Carotid: Velocities in the left ICA are consistent with a 1-39% stenosis.               IMAGES 1-26 are mislabled, 1-26 are images of the left carotid               system!!!. Vertebrals: Bilateral vertebral arteries demonstrate antegrade flow. *See table(s) above for measurements and observations.     Preliminary     PHYSICAL EXAM   Temp:  [97.3 F (36.3 C)-97.6 F (36.4 C)] 97.4 F (36.3 C) (12/16  0416) Pulse Rate:  [62-70] 69 (12/15 2132) Resp:  [13-17] 13 (12/15 1700) BP: (95-163)/(55-71) 148/71 (12/16 0416) SpO2:  [99 %-100 %] 100 % (12/15 2132) Weight:  [44.2 kg] 44.2 kg (12/16 0416)  General - Well nourished, well developed, in no apparent distress.  Ophthalmologic - fundi not visualized due to noncooperation.  Cardiovascular - Regular rhythm and rate.  Mental Status -  Level of arousal and orientation to time, place, and person were intact. Language including expression, naming, repetition, comprehension was assessed and found intact. Fund of Knowledge was assessed and was intact.  Cranial Nerves II - XII - II - Visual field intact OU. III, IV, VI - Extraocular movements intact. V - Facial sensation intact bilaterally. VII - Facial movement intact bilaterally. VIII - Hearing & vestibular intact bilaterally. X - Palate elevates symmetrically. XI - Chin turning & shoulder shrug intact bilaterally. XII - Tongue protrusion intact.  Motor Strength - The patient's strength was normal in RUE and RLE however, LUE3/5 and LLE 4/5.  Bulk was normal and fasciculations were absent.   Motor Tone - Muscle tone was assessed at the neck and appendages and was normal.  Reflexes - The patient's reflexes were symmetrical in all extremities and she had no pathological reflexes.  Sensory - Light touch, temperature/pinprick were assessed and were symmetrical.    Coordination - The patient had normal movements in the right hand with no ataxia or dysmetria.  Tremor was absent.  Gait and Station - deferred.   ASSESSMENT/PLAN Ms. PARNEET GLANTZ is a 73 y.o. female with history of migraines, hypertension, depression, gastroesophageal reflux and ongoing weight loss with over more than a year of unexplainable  weight loss for which she has received a GI work-up, presenting with 2 to 3 days worth of left-sided weakness and difficulty with speech.   Stroke:  right CR infarct secondary to  small vessel disease in setting of large vessel disease source  CT head Small vessel disease. Atrophy. Old lacunes. No acute abnormality.   MRI  R corona radiata infarct. Multiple old lacunes L frontal lobe, B basal ganglia, B thalami. Small vessel disease.   CTA head & neck no LVO. Extensive plaque R ICA w/ 65-70% stenosis. Multifocal moderate L CCA/origin 50% stenosis. extenvie atherosclerosis w/ severe stenosis supraclinoid L ICA, moderate proximal R M1 and L M1, moderate to severe multifocal L V4 stenoses. B PCAs extensive atherosclerosis. 7mm outpouching R PCA ? Small aneurysm.  Carotid Doppler right ICA 40-59% stenosis  2D Echo EF 60-65%  VVS Dr. Carlis Abbott recommended R CEA, but pt currently declined  LDL 87  HgbA1c 5.8  Lovenox 30 mg sq daily for VTE prophylaxis  No antithrombotic prior to admission, now on aspirin 325 mg daily and clopidogrel 75 mg daily. Continue DAPT for 3 months and then ASA alone.  Therapy recommendations:  CIR - but pt declined  Disposition:  pending   Symptomatic right ICA stenosis  CTA head & neck Extensive plaque R ICA w/ 65-70% stenosis.   Carotid Doppler right ICA 40-59% stenosis  VVS Dr Carlis Abbott on board, so far pt declined right CEA  Hypotension  Hx of hypertension  BP improved  Clonidine discontinued at this time . Continue NS IVF @ 75cc/h  . Permissive hypertension (OK if < 220/120) but gradually normalize in 5-7 days . Long-term BP goal 130-160 before carotid intervention  Hyperlipidemia  Home meds:  zocor 40  Now on lipitor 80  LDL 87, goal < 70  Continue statin at discharge  Depression   As per husband, the depression has been going on for years  She followed with psychiatry for a while and then lost follow up  Pt had no motivation at home. 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), declining CEA and CIR  Could be part of the  reason for her loss of appetite and weight loss  Unintentional weight loss  Likely multifactorial  Could be related to long term depression, anorexia vervosa  Had GI work up before but failed follow up  Continue outpt follow up and work up  Could be related topamax use - weight loss is common side effect of topamax - recommend taper off topamax - topamax decreased to 100mg  for 3 days and then 50mg  for 3 days and then 25mg  for 3 days and then off  Other Stroke Risk Factors  Advanced age  Former cigarette smoker  Migraines on topamax - taper off topamax - follow up with neurology as outpt for alternative if needed for migraine  Other Active Problems  Anxiety on klonopin  Hospital day # 2  I spent  35 minutes in total face-to-face time with the patient, more than 50% of which was spent in counseling and coordination of care, reviewing test results, images and medication, and discussing the diagnosis of stroke, right ICA stenosis, depression, weight loss, treatment plan and potential prognosis. This patient's care requiresreview of multiple databases, neurological assessment, discussion with family, other specialists and medical decision making of high complexity. I had long discussion with pt and husband at bedside, updated pt current condition, treatment plan and potential  prognosis, and answered all the questions. They expressed understanding and appreciation. I also discussed with Dr. Chestine Spore.   Marvel Plan, MD PhD Stroke Neurology 04/20/2019 9:59 AM  To contact Stroke Continuity provider, please refer to [email or web address]. After hours, contact General Neurology

## 2019-04-20 NOTE — Progress Notes (Addendum)
Inpatient Rehabilitation Admissions Coordinator  I met with patient at bedside to discus a possible inpt rehab admit. Patient states she wants to go directly home and that her spouse and daughter can care for her. I will contact her spouse to further discuss her preference. She does not want CIR admit.  Danne Baxter, RN, MSN Rehab Admissions Coordinator [phone] 04/20/2019 10:43 AM    I met with patient and her spouse at bedside to discuss rehab venue options. Patient wishes to d/c home. I asked for the couple to discus between them and I will follow up tomorrow. Patient with history of depression and spouse states she has lost her will. I do feel this is playing a role in her decisions.  Danne Baxter, RN, MSN Rehab Admissions Coordinator [phone] 04/20/2019 12:13 PM

## 2019-04-20 NOTE — Evaluation (Signed)
Speech Language Pathology Evaluation Patient Details Name: BRANDIN DILDAY MRN: [id number] DOB: [birth year] Today's Date: 04/20/2019 Time: 0930-1000 SLP Time Calculation (min) (ACUTE ONLY): 30 min  Problem List:  Patient Active Problem List   Diagnosis Date Noted  . Acute CVA (cerebrovascular accident) ([place]) 04/18/2019  . Weight loss, unintentional 04/18/2019  . Anxiety 04/18/2019  . Major depression in partial remission ([place]) 08/25/2012  . Osteoporosis, unspecified 08/25/2012  . HTN (hypertension) 08/25/2012  . Fall at home 08/24/2012  . Pelvic fracture ([place]) 08/24/2012  . Migraines 08/24/2012  . Renal insufficiency, mild 08/24/2012   Past Medical History:  Past Medical History:  Diagnosis Date  . Closed fracture of coccyx ([place]) 2015   AFTER FALL  . Depression   . GERD (gastroesophageal reflux disease)   . Headache    MIGRAINES OCCASIONAL  . History of kidney stones    HAS NOW  . Hypertension    Past Surgical History:  Past Surgical History:  Procedure Laterality Date  . ABDOMINAL HYSTERECTOMY     1 OVARY LEFT  . APPENDECTOMY  AGE 73   DONE WITH OVARY REMOVAL  . COLONOSCOPY WITH PROPOFOL N/A 12/03/2015   Procedure: COLONOSCOPY WITH PROPOFOL;  Surgeon: Garlan Fair, MD;  Location: [facility];  Service: Endoscopy;  Laterality: N/A;  . ESOPHAGOGASTRODUODENOSCOPY (EGD) WITH PROPOFOL N/A 12/03/2015   Procedure: ESOPHAGOGASTRODUODENOSCOPY (EGD) WITH PROPOFOL;  Surgeon: Garlan Fair, MD;  Location: [facility];  Service: Endoscopy;  Laterality: N/A;  . TONSILLECTOMY  AGE 76   AND ADENOIDS REMOVED   HPI:  73 y.o. female admitted 04/18/2019 with left side weakness x3 days. PMH: hypertension, migraine headaches, depression, GERD, significant weight loss over last year.  MRI = Right corona radiata acute infarct. Multiple chronic lacunar infarcts left frontal lobe periventricular white matter, bilateral basal ganglia and thalami, moderate chronic small vessel ischemic  disease.   Assessment / Plan / Recommendation Clinical Impression  The [place] Mental Status (SLUMS) Examination was administered. Pt scored 11/30, raising concern for neurocognitive disorder. Pt reports she did not graduate high school, completing the 11th grade. Pt presents with decreased hearing acuity, and is easily distractible, requiring cues to attend to task of repeating words.   Pt exhibited difficulty with orientation, immediate and delayed recall, mental math, thought organization (naming only 7 animals), digit reversal, clock drawing, figure recognition, and auditory retention of information. This issues raise concern for pt safety if left alone, however, she indicates her husband manages household finances and is present 24/7. Pt indicated she managed her medications independently prior to admit, and did most of the house hold responsibilities. She did not drive prior to admit.   At this time, 24 hour supervision is recommended at discharge, with home health speech therapy. Further neurological workup is also recommended to determine cognitive deficits related to stroke vs possible subcortical dementia (given lacunar infarcts per MRI), as speech therapy cannot diagnose dementia.  No further ST intervention is recommended at this level of care. Pt would benefit from follow up at next venue to facilitate establishment of routines and compensatory strategies to maximize safety and independence.     SLP Assessment  SLP Recommendation/Assessment: All further Speech Language Pathology needs can be addressed in the next venue of care  SLP Visit Diagnosis: Cognitive communication deficit (R41.841)    Follow Up Recommendations  24 hour supervision/assistance;Home health SLP          SLP Evaluation Cognition  Overall Cognitive Status: No family/caregiver present to  determine baseline cognitive functioning Arousal/Alertness: Awake/alert Orientation Level: Oriented to  person;Oriented to place;Oriented to situation;Disoriented to time Attention: Focused;Sustained Focused Attention: Appears intact Sustained Attention: Impaired Sustained Attention Impairment: Verbal basic;Functional basic Memory: Impaired Memory Impairment: Storage deficit;Retrieval deficit;Decreased recall of new information;Decreased short term memory Decreased Short Term Memory: Verbal basic;Functional basic Awareness: Impaired Awareness Impairment: Intellectual impairment Problem Solving: Impaired Problem Solving Impairment: Verbal basic;Functional basic Executive Function: Organizing;Self Monitoring Organizing: Impaired Self Monitoring: Impaired Safety/Judgment: Impaired       Comprehension  Auditory Comprehension Overall Auditory Comprehension: Appears within functional limits for tasks assessed Interfering Components: Hearing    Expression Expression Primary Mode of Expression: Verbal Verbal Expression Overall Verbal Expression: Appears within functional limits for tasks assessed Written Expression Dominant Hand: Left   Oral / Motor  Oral Motor/Sensory Function Overall Oral Motor/Sensory Function: Within functional limits Motor Speech Overall Motor Speech: Appears within functional limits for tasks assessed Intelligibility: Intelligible Motor Planning: Witnin functional limits   GO                   Harlow Asa, MSP, CCC-SLP Speech Language Pathologist Office: [phone] Pager: [phone]  Leigh Aurora 04/20/2019, 10:09 AM

## 2019-04-20 NOTE — Consult Note (Signed)
Hospital Consult    Reason for Consult: Symptomatic right carotid stenosis Referring Physician: Dr. Gerri LinsSwayze MRN #:  [id number]  History of Present Illness: This is a 73 y.o. female with history of hypertension that vascular surgery has been consulted for symptomatic right carotid stenosis.  Patient was in her usual state of health until 4 days ago when she had acute onset of left upper arm and left lower leg weakness.  She states this has never happened before.  She underwent extensive stroke work-up.  MRI brain on 04/18/2019 showed 1.4 cm acute infarct within the right corona radiata.  CTA head and neck showed was negative for large vessel occlusion but did show a 65 to 70% mixed echogenic plaque in the origin of the right ICA and she also had extensive disease throughout the intracranial circulation. Patient patient's husband is at the bedside and states that his wife has been decline for some time.  Sounds like she has not been motivated and has only been walking to the bathroom and has otherwise been very sluggish at home.  Also has been losing weight. Patient states she does not want any surgery just wants to go home.  Past Medical History:  Diagnosis Date  . Closed fracture of coccyx ([facility]) 2015   AFTER FALL  . Depression   . GERD (gastroesophageal reflux disease)   . Headache    MIGRAINES OCCASIONAL  . History of kidney stones    HAS NOW  . Hypertension     Past Surgical History:  Procedure Laterality Date  . ABDOMINAL HYSTERECTOMY     1 OVARY LEFT  . APPENDECTOMY  AGE 71   DONE WITH OVARY REMOVAL  . COLONOSCOPY WITH PROPOFOL N/A 12/03/2015   Procedure: COLONOSCOPY WITH PROPOFOL;  Surgeon: Charolett BumpersMartin K Johnson, MD;  Location: [facility];  Service: Endoscopy;  Laterality: N/A;  . ESOPHAGOGASTRODUODENOSCOPY (EGD) WITH PROPOFOL N/A 12/03/2015   Procedure: ESOPHAGOGASTRODUODENOSCOPY (EGD) WITH PROPOFOL;  Surgeon: Charolett BumpersMartin K Johnson, MD;  Location: [facility];  Service: Endoscopy;   Laterality: N/A;  . TONSILLECTOMY  AGE 62   AND ADENOIDS REMOVED    Allergies  Allergen Reactions  . Paroxetine Other (See Comments)    Shakes-tremors    Prior to Admission medications   Medication Sig Start Date End Date Taking? Authorizing Provider  atenolol (TENORMIN) 50 MG tablet Take 50 mg by mouth every evening.    Yes [provider]  clonazePAM (KLONOPIN) 1 MG tablet Take 1 tablet (1 mg total) by mouth 2 (two) times daily. Patient taking differently: Take 1-2 mg by mouth See admin instructions. Take 1 tablet in the morning, and 2 tablets at bedtime 08/28/12  Yes Marden NobleGates, Robert, MD  cloNIDine (CATAPRES) 0.3 MG tablet Take 0.3 mg by mouth daily.   Yes [provider]  ondansetron (ZOFRAN) 4 MG tablet Take 1 tablet (4 mg total) by mouth every 6 (six) hours. Patient taking differently: Take 4 mg by mouth every 8 (eight) hours as needed for nausea or vomiting.  07/20/17  Yes Upstill, Melvenia BeamShari, PA-C  simvastatin (ZOCOR) 40 MG tablet Take 40 mg by mouth every evening. 09/27/15  Yes [provider]  topiramate (TOPAMAX) 100 MG tablet Take 150 mg by mouth at bedtime. For headaches   Yes [provider]    Social History   Socioeconomic History  . Marital status: Married    Spouse name: Not on file  . Number of children: Not on file  . Years of education: Not on file  .  Highest education level: Not on file  Occupational History  . Not on file  Tobacco Use  . Smoking status: Former Smoker    Quit date: 05/06/1995    Years since quitting: 23.9  . Smokeless tobacco: Never Used  Substance and Sexual Activity  . Alcohol use: No  . Drug use: No  . Sexual activity: Not on file  Other Topics Concern  . Not on file  Social History Narrative  . Not on file   Social Determinants of Health   Financial Resource Strain:   . Difficulty of Paying Living Expenses: Not on file  Food Insecurity:   . Worried About Programme researcher, broadcasting/film/video in the Last Year: Not on  file  . Ran Out of Food in the Last Year: Not on file  Transportation Needs:   . Lack of Transportation (Medical): Not on file  . Lack of Transportation (Non-Medical): Not on file  Physical Activity:   . Days of Exercise per Week: Not on file  . Minutes of Exercise per Session: Not on file  Stress:   . Feeling of Stress : Not on file  Social Connections:   . Frequency of Communication with Friends and Family: Not on file  . Frequency of Social Gatherings with Friends and Family: Not on file  . Attends Religious Services: Not on file  . Active Member of Clubs or Organizations: Not on file  . Attends Banker Meetings: Not on file  . Marital Status: Not on file  Intimate Partner Violence:   . Fear of Current or Ex-Partner: Not on file  . Emotionally Abused: Not on file  . Physically Abused: Not on file  . Sexually Abused: Not on file     History reviewed. No pertinent family history.  ROS: [x]  Positive   [ ]  Negative   [ ]  All sytems reviewed and are negative  Cardiovascular: []  chest pain/pressure []  palpitations []  SOB lying flat []  DOE []  pain in legs while walking []  pain in legs at rest []  pain in legs at night []  non-healing ulcers []  hx of DVT []  swelling in legs  Pulmonary: []  productive cough []  asthma/wheezing []  home O2  Neurologic: []  weakness in []  arms []  legs []  numbness in []  arms []  legs []  hx of CVA []  mini stroke [] difficulty speaking or slurred speech []  temporary loss of vision in one eye []  dizziness  Hematologic: []  hx of cancer []  bleeding problems []  problems with blood clotting easily  Endocrine:   []  diabetes []  thyroid disease  GI []  vomiting blood []  blood in stool  GU: []  CKD/renal failure []  HD--[]  M/W/F or []  T/T/S []  burning with urination []  blood in urine  Psychiatric: []  anxiety []  depression  Musculoskeletal: []  arthritis []  joint pain  Integumentary: []  rashes []   ulcers  Constitutional: []  fever []  chills   Physical Examination  Vitals:   04/19/19 2132 04/20/19 0416  BP: (!) 163/66 (!) 148/71  Pulse: 69   Resp:    Temp: (!) 97.3 F (36.3 C) (!) 97.4 F (36.3 C)  SpO2: 100%    Body mass index is 18.11 kg/m.  General:  NAD Gait: Not observed HENT: WNL, normocephalic Pulmonary: normal non-labored breathing, without Rales, rhonchi,  wheezing Cardiac: regular, without  Murmurs, rubs or gallops Abdomen: soft, NT/ND, no masses Skin: without rashes Vascular Exam/Pulses:  Right Left  Radial 2+ (normal) 2+ (normal)  Ulnar    Femoral 2+ (normal) 2+ (normal)  Popliteal    DP 2+ (normal) 2+ (normal)  PT     Extremities: without ischemic changes, without Gangrene , without cellulitis; without open wounds;  Musculoskeletal: no muscle wasting or atrophy  Neurologic: A&O X 3; Appropriate Affect ; SENSATION: normal; MOTOR FUNCTION: Left upper arm 4 out of 5 strength.  Left lower leg 4 out of 5 strength.    CBC    Component Value Date/Time   WBC 3.8 (L) 04/18/2019 1234   RBC 4.05 04/18/2019 1234   HGB 12.6 04/18/2019 1234   HCT 38.8 04/18/2019 1234   PLT 120 (L) 04/18/2019 1234   MCV 95.8 04/18/2019 1234   MCH 31.1 04/18/2019 1234   MCHC 32.5 04/18/2019 1234   RDW 12.6 04/18/2019 1234   LYMPHSABS 1.1 04/18/2019 1234   MONOABS 0.3 04/18/2019 1234   EOSABS 0.3 04/18/2019 1234   BASOSABS 0.1 04/18/2019 1234    BMET    Component Value Date/Time   NA 144 04/18/2019 1234   K 3.5 04/18/2019 1234   CL 114 (H) 04/18/2019 1234   CO2 22 04/18/2019 1234   GLUCOSE 88 04/18/2019 1234   BUN 15 04/18/2019 1234   CREATININE 1.01 (H) 04/18/2019 1234   CALCIUM 8.9 04/18/2019 1234   GFRNONAA 56 (L) 04/18/2019 1234   GFRAA >60 04/18/2019 1234    COAGS: Lab Results  Component Value Date   INR 1.0 04/18/2019   INR 1.05 08/24/2012     Non-Invasive Vascular Imaging:    MRI brain 04/18/2019: 1.4 cm acute infarct within the right  corona radiata   CTA neck from yesterday: On my review patient does have approximate 60% stenosis within the proximal right ICA with soft appearing plaque.  Moderate to severe disease throughout intra-cranial circulation as documented.  Carotid duplex yesterday:   L ICA proximal 166/45 consistent with 40-59% stenosis  ASSESSMENT/PLAN: This is a 73 y.o. female with symptomatic 60% right ICA stenosis that presented with right corona radiata infarct (also in the setting of intracranial small vessel disease).  I have recommended a right carotid endarterectomy and discussed in detail benefits of surgery including stroke risk reduction.  We discussed risks of surgery as well including risk of perioperative stroke, hematoma, bleeding, infection, etc.  Discussed in detail that I think she would benefit significantly from carotid revascularization.  That being said patient is not interested in surgery.  States she just wants to go home.  She is alert and oriented x3 and seems to have good understanding of her current clinical course but states she is not interested in surgery.  I will follow-up with her again tomorrow.  I did discuss with Dr. Erlinda Hong and he states she would be cleared for surgery tomorrow if possible.  Marty Heck, MD [facility] Office: [phone] Pager: [place]

## 2019-04-20 NOTE — Progress Notes (Signed)
PROGRESS NOTE  ARDINE IACOVELLI HXT:[id number] DOB: 1945-07-14 DOA: 04/18/2019 PCP: Josetta Huddle, MD  Brief History   Patricia Barra Gregoryis a 73 y.o.femalewith medical history significant ofhypertension, migraine headaches, depression, GERD being brought to the hospital via EMS for evaluation of left-sided weakness. In the ER Brain MRI showing a 1.4 cm acute infarct within the right corona radiata and she was admitted for further stroke workup. Started on daily ASA, TTE pending and CTA head and neck shows extensive atherosclerotic changes as well as significant stenosis in the right ICA.   Vascular surgery has been consulted. The patient has decided to go forward with CEA. Dr. Carlis Abbott plans on taking her to surgery in the morning.  Consultants  . Neurology . PM & R . Vascular Surgery  Procedures  . None  Antibiotics   Anti-infectives (From admission, onward)   None     Subjective  The patient is resting comfortably. No new complaints. She is upset that she missed breakfast.  Objective   Vitals:  Vitals:   04/20/19 0416 04/20/19 1436  BP: (!) 148/71 (!) 158/66  Pulse:  65  Resp:    Temp: (!) 97.4 F (36.3 C) 98 F (36.7 C)  SpO2:  100%   Exam:  Constitutional:  . The patient is awake, alert, and oriented x 3. No acute distress. Respiratory:  . No increased work of breathing. . No wheezes, rales, or rhonchi . No tactile fremitus Cardiovascular:  . Regular rate and rhythm . No murmurs, ectopy, or gallups. . No lateral PMI. No thrills. Abdomen:  . Abdomen is soft, non-tender, non-distended . No hernias, masses, or organomegaly . Normoactive bowel sounds.  Musculoskeletal:  . No cyanosis, clubbing, or edema Skin:  . No rashes, lesions, ulcers . palpation of skin: no induration or nodules Neurologic:  . Left sided facial droop and weakness. [name]  Psychiatric:  . Mental status o Mood, affect appropriate o Orientation to person, place, time  . judgment and  insight appear intact  I have personally reviewed the following:   Today's Data  . Vitals  Imaging  . Carotid dopplers  Scheduled Meds: . aspirin  300 mg Rectal Daily   Or  . aspirin  325 mg Oral Daily  . atenolol  50 mg Oral QPM  . atorvastatin  80 mg Oral q1800  . clonazePAM  1 mg Oral q morning - 10a  . clonazePAM  2 mg Oral QHS  . clopidogrel  75 mg Oral Daily  . enoxaparin (LOVENOX) injection  30 mg Subcutaneous Q24H  . pneumococcal 23 valent vaccine  0.5 mL Intramuscular Tomorrow-1000  . topiramate  100 mg Oral QHS   Continuous Infusions: . sodium chloride 75 mL/hr at 04/19/19 2320    Principal Problem:   Acute CVA (cerebrovascular accident) [facility]) Active Problems:   Migraines   HTN (hypertension)   Weight loss, unintentional   Anxiety   LOS: 2 days   A & P   Acute ischemic stroke:  Patient presented with a3-day history of left-sided weakness.Brain MRI showing a 1.4 cm acute infarct within the right corona radiata. Neurology was consulted. CTA head and neck demonstrated significant stenosis of the Rt ICA. Vascular consult was called. Dr. Carlis Abbott will take the patient for CEA tomorrow am. Echocardiogram demonstrates LVEF of 60-65%. No LVH with Grade II Diastolic dysfunction. There are no wall motion abnormalities. No valvular abnormalities. No intracardiac sources of emboli. The patient is receiving ASA 325 mg daily, atorvastatin 80 mg daily.  Blood pressure medications have been restarted.  Carotid stenosis: Right ICA stenosis. Plan is for patient to go for CEA in the am. She is NPO after midnight. Vascular surgery's assistance is greatly appreciated.  Uncontrolled hypertension: Per husband, patient has not had regular follow-up with her PCP.Blood pressure is elevated with systolic up to 803O.Currently outside window for permissive hypertension in the setting of stroke. Resume home atenolol and Klonopin. Hydralazine as needed for SBP greater than  180.  Unintentional weight loss: Per husband, patient has had loss of appetite, intermittent diarrhea, and significant unintentional weight loss over the past 1 year and has not had any recent outpatient work-up done. Per chart, 16 kg weight loss since 07/2017.She is a former smoker.Per chart, patient was last seen by GI in July 2017 for evaluation of chronic watery diarrhea and unintentional weight loss.Colonoscopy and EGD were done at that time. Colonoscopy revealed normal colon and biopsiesshowing benign colonic mucosa. EGD showing normal stomach, esophagus, and duodenum. Small intestinal biopsy showing benign small bowel mucosa and stomach biopsy showing essentially unremarkable gastric type mucosa. CXR performed on 04/19/2019 demonstrated hype-rexapansion and chronic bronchitic changes without superimposed acute cardiopulmonary disease and aortic atherosclerosis. TSH  - nl. ESR - nl. CRP - nl. HIV - nr. FOBT pending. The patient will need a GI follow-up after discharge.  Anxiety: Continue home Klonopin.  History of migraine headaches: Continue home Topamax.  I have seen and examined this patient myself. I have spent 42 minutes in her evaluation and care. More than 50% of this was spent in coordination of care with vascular surgery.  DVT prophylaxis:Lovenox Code Status:Partial code (NOintubation/mechanical ventilation) Family Communication:Husband at bedside. Disposition Plan:tbd  Elif Yonts, DO [facility] Direct contact: see [email or web address]  7PM-7AM contact night coverage as above 04/20/2019, 5:32 PM  LOS: 2 days

## 2019-04-21 ENCOUNTER — Inpatient Hospital Stay (HOSPITAL_COMMUNITY): Payer: Medicare HMO

## 2019-04-21 ENCOUNTER — Encounter (HOSPITAL_COMMUNITY)
Admission: EM | Disposition: A | Discharge status: Rehabilitation Facility | Payer: Self-pay | Source: Home / Self Care | Attending: Internal Medicine

## 2019-04-21 DIAGNOSIS — I6521 Occlusion and stenosis of right carotid artery: Secondary | ICD-10-CM

## 2019-04-21 HISTORY — PX: ENDARTERECTOMY: SHX5162

## 2019-04-21 LAB — CBC WITH DIFFERENTIAL/PLATELET
Abs Immature Granulocytes: 0.01 10*3/uL (ref 0.00–0.07)
Basophils Absolute: 0 10*3/uL (ref 0.0–0.1)
Basophils Relative: 1 %
Eosinophils Absolute: 0.4 10*3/uL (ref 0.0–0.5)
Eosinophils Relative: 8 %
HCT: 35 % — ABNORMAL LOW (ref 36.0–46.0)
Hemoglobin: 11.7 g/dL — ABNORMAL LOW (ref 12.0–15.0)
Immature Granulocytes: 0 %
Lymphocytes Relative: 40 %
Lymphs Abs: 2 10*3/uL (ref 0.7–4.0)
MCH: 31.5 pg (ref 26.0–34.0)
MCHC: 33.4 g/dL (ref 30.0–36.0)
MCV: 94.1 fL (ref 80.0–100.0)
Monocytes Absolute: 0.4 10*3/uL (ref 0.1–1.0)
Monocytes Relative: 8 %
Neutro Abs: 2.2 10*3/uL (ref 1.7–7.7)
Neutrophils Relative %: 43 %
Platelets: 120 10*3/uL — ABNORMAL LOW (ref 150–400)
RBC: 3.72 MIL/uL — ABNORMAL LOW (ref 3.87–5.11)
RDW: 12.5 % (ref 11.5–15.5)
WBC: 5.1 10*3/uL (ref 4.0–10.5)
nRBC: 0 % (ref 0.0–0.2)

## 2019-04-21 LAB — BASIC METABOLIC PANEL
Anion gap: 9 (ref 5–15)
BUN: 11 mg/dL (ref 8–23)
CO2: 20 mmol/L — ABNORMAL LOW (ref 22–32)
Calcium: 8.7 mg/dL — ABNORMAL LOW (ref 8.9–10.3)
Chloride: 115 mmol/L — ABNORMAL HIGH (ref 98–111)
Creatinine, Ser: 0.99 mg/dL (ref 0.44–1.00)
GFR calc Af Amer: >60 mL/min (ref >60)
GFR calc non Af Amer: 57 mL/min — ABNORMAL LOW (ref >60)
Glucose, Bld: 94 mg/dL (ref 70–99)
Potassium: 3 mmol/L — ABNORMAL LOW (ref 3.5–5.1)
Sodium: 144 mmol/L (ref 135–145)

## 2019-04-21 LAB — SURGICAL PCR SCREEN
MRSA, PCR: NEGATIVE
Staphylococcus aureus: NEGATIVE

## 2019-04-21 LAB — POCT ACTIVATED CLOTTING TIME: Activated Clotting Time: 296 seconds

## 2019-04-21 SURGERY — ENDARTERECTOMY, CAROTID
Anesthesia: General | Site: Neck | Laterality: Right

## 2019-04-21 MED ORDER — FENTANYL CITRATE (PF) 100 MCG/2ML IJ SOLN
INTRAMUSCULAR | Status: AC
  Filled 2019-04-21: qty 2

## 2019-04-21 MED ORDER — PROPOFOL 10 MG/ML IV BOLUS
INTRAVENOUS | Status: AC
  Filled 2019-04-21: qty 20

## 2019-04-21 MED ORDER — IOHEXOL 300 MG/ML  SOLN
100.0000 mL | Freq: Once | INTRAMUSCULAR | Status: AC | PRN
  Administered 2019-04-21: 100 mL via INTRAVENOUS

## 2019-04-21 MED ORDER — SODIUM CHLORIDE 0.9 % IV SOLN
INTRAVENOUS | Status: DC | PRN
  Administered 2019-04-21: 500 mL

## 2019-04-21 MED ORDER — DEXAMETHASONE SODIUM PHOSPHATE 10 MG/ML IJ SOLN
INTRAMUSCULAR | Status: DC | PRN
  Administered 2019-04-21: 10 mg via INTRAVENOUS

## 2019-04-21 MED ORDER — METOPROLOL TARTRATE 5 MG/5ML IV SOLN: 2.0000 mg | INTRAVENOUS | Status: DC | PRN

## 2019-04-21 MED ORDER — HYDROMORPHONE HCL 1 MG/ML IJ SOLN
0.5000 mg | INTRAMUSCULAR | Status: DC | PRN
  Administered 2019-04-23: 0.5 mg via INTRAVENOUS
  Filled 2019-04-21: qty 1

## 2019-04-21 MED ORDER — ONDANSETRON HCL 4 MG/2ML IJ SOLN
4.0000 mg | Freq: Four times a day (QID) | INTRAMUSCULAR | Status: DC | PRN
  Administered 2019-04-21: 4 mg via INTRAVENOUS
  Filled 2019-04-21: qty 2

## 2019-04-21 MED ORDER — LIDOCAINE HCL (PF) 1 % IJ SOLN
INTRAMUSCULAR | Status: AC
  Filled 2019-04-21: qty 30

## 2019-04-21 MED ORDER — PROTAMINE SULFATE 10 MG/ML IV SOLN
INTRAVENOUS | Status: DC | PRN
  Administered 2019-04-21: 40 mg via INTRAVENOUS

## 2019-04-21 MED ORDER — ONDANSETRON HCL 4 MG/2ML IJ SOLN: 4.0000 mg | Freq: Once | INTRAMUSCULAR | Status: DC | PRN

## 2019-04-21 MED ORDER — LABETALOL HCL 5 MG/ML IV SOLN
10.0000 mg | INTRAVENOUS | Status: DC | PRN
  Administered 2019-04-23 (×2): 10 mg via INTRAVENOUS
  Filled 2019-04-21 (×3): qty 4

## 2019-04-21 MED ORDER — GUAIFENESIN-DM 100-10 MG/5ML PO SYRP: 15.0000 mL | ORAL_SOLUTION | ORAL | Status: DC | PRN

## 2019-04-21 MED ORDER — CEFAZOLIN SODIUM-DEXTROSE 2-4 GM/100ML-% IV SOLN
2.0000 g | Freq: Once | INTRAVENOUS | Status: AC
  Administered 2019-04-21: 16:00:00 2 g via INTRAVENOUS
  Filled 2019-04-21: qty 100

## 2019-04-21 MED ORDER — HYDROCODONE-ACETAMINOPHEN 5-325 MG PO TABS
1.0000 | ORAL_TABLET | ORAL | Status: DC | PRN
  Administered 2019-04-21 (×2): 2 via ORAL
  Administered 2019-04-22: 1 via ORAL
  Administered 2019-04-22 – 2019-04-25 (×10): 2 via ORAL
  Filled 2019-04-21 (×2): qty 2
  Filled 2019-04-21: qty 1
  Filled 2019-04-21 (×10): qty 2

## 2019-04-21 MED ORDER — PANTOPRAZOLE SODIUM 40 MG PO TBEC
40.0000 mg | DELAYED_RELEASE_TABLET | Freq: Every day | ORAL | Status: DC
  Administered 2019-04-22: 40 mg via ORAL
  Filled 2019-04-21: qty 1

## 2019-04-21 MED ORDER — SODIUM CHLORIDE 0.9 % IV SOLN
INTRAVENOUS | Status: AC
  Filled 2019-04-21: qty 1.2

## 2019-04-21 MED ORDER — FENTANYL CITRATE (PF) 100 MCG/2ML IJ SOLN
25.0000 ug | INTRAMUSCULAR | Status: DC | PRN
  Administered 2019-04-21 (×3): 25 ug via INTRAVENOUS

## 2019-04-21 MED ORDER — GLYCOPYRROLATE 0.2 MG/ML IJ SOLN
INTRAMUSCULAR | Status: DC | PRN
  Administered 2019-04-21: .1 mg via INTRAVENOUS

## 2019-04-21 MED ORDER — HEPARIN SODIUM (PORCINE) 1000 UNIT/ML IJ SOLN
INTRAMUSCULAR | Status: AC
  Filled 2019-04-21: qty 1

## 2019-04-21 MED ORDER — SUGAMMADEX SODIUM 200 MG/2ML IV SOLN
INTRAVENOUS | Status: DC | PRN
  Administered 2019-04-21: 100 mg via INTRAVENOUS

## 2019-04-21 MED ORDER — SODIUM CHLORIDE 0.9 % IV SOLN: 500.0000 mL | Freq: Once | INTRAVENOUS | Status: DC | PRN

## 2019-04-21 MED ORDER — FENTANYL CITRATE (PF) 100 MCG/2ML IJ SOLN
INTRAMUSCULAR | Status: DC | PRN
  Administered 2019-04-21: 100 ug via INTRAVENOUS
  Administered 2019-04-21: 50 ug via INTRAVENOUS

## 2019-04-21 MED ORDER — PHENOL 1.4 % MT LIQD: 1.0000 | OROMUCOSAL | Status: DC | PRN

## 2019-04-21 MED ORDER — PROPOFOL 10 MG/ML IV BOLUS
INTRAVENOUS | Status: DC | PRN
  Administered 2019-04-21 (×2): 10 mg via INTRAVENOUS
  Administered 2019-04-21: 70 mg via INTRAVENOUS

## 2019-04-21 MED ORDER — ENOXAPARIN SODIUM 30 MG/0.3ML ~~LOC~~ SOLN
30.0000 mg | SUBCUTANEOUS | Status: DC
  Administered 2019-04-22 – 2019-04-25 (×4): 30 mg via SUBCUTANEOUS
  Filled 2019-04-21 (×4): qty 0.3

## 2019-04-21 MED ORDER — ROCURONIUM BROMIDE 10 MG/ML (PF) SYRINGE
PREFILLED_SYRINGE | INTRAVENOUS | Status: DC | PRN
  Administered 2019-04-21: 50 mg via INTRAVENOUS

## 2019-04-21 MED ORDER — POTASSIUM CHLORIDE CRYS ER 20 MEQ PO TBCR
20.0000 meq | EXTENDED_RELEASE_TABLET | Freq: Every day | ORAL | Status: AC | PRN
  Administered 2019-04-21: 40 meq via ORAL
  Filled 2019-04-21: qty 2

## 2019-04-21 MED ORDER — HEPARIN SODIUM (PORCINE) 1000 UNIT/ML IJ SOLN
INTRAMUSCULAR | Status: DC | PRN
  Administered 2019-04-21: 5000 [IU] via INTRAVENOUS

## 2019-04-21 MED ORDER — 0.9 % SODIUM CHLORIDE (POUR BTL) OPTIME
TOPICAL | Status: DC | PRN
  Administered 2019-04-21 (×2): 1000 mL

## 2019-04-21 MED ORDER — LACTATED RINGERS IV SOLN: INTRAVENOUS | Status: DC | PRN

## 2019-04-21 MED ORDER — FENTANYL CITRATE (PF) 250 MCG/5ML IJ SOLN
INTRAMUSCULAR | Status: AC
  Filled 2019-04-21: qty 5

## 2019-04-21 MED ORDER — MAGNESIUM SULFATE 2 GM/50ML IV SOLN: 2.0000 g | Freq: Every day | INTRAVENOUS | Status: DC | PRN

## 2019-04-21 MED ORDER — HEMOSTATIC AGENTS (NO CHARGE) OPTIME
TOPICAL | Status: DC | PRN
  Administered 2019-04-21: 1 via TOPICAL

## 2019-04-21 MED ORDER — SODIUM CHLORIDE 0.9 % IV SOLN
0.0125 ug/kg/min | INTRAVENOUS | Status: AC
  Administered 2019-04-21: .05 ug/kg/min via INTRAVENOUS
  Filled 2019-04-21 (×2): qty 2000

## 2019-04-21 MED ORDER — FENTANYL CITRATE (PF) 100 MCG/2ML IJ SOLN: 25.0000 ug | INTRAMUSCULAR | Status: DC | PRN

## 2019-04-21 MED ORDER — LIDOCAINE 2% (20 MG/ML) 5 ML SYRINGE
INTRAMUSCULAR | Status: DC | PRN
  Administered 2019-04-21: 100 mg via INTRAVENOUS

## 2019-04-21 MED ORDER — ONDANSETRON HCL 4 MG/2ML IJ SOLN
INTRAMUSCULAR | Status: DC | PRN
  Administered 2019-04-21: 4 mg via INTRAVENOUS

## 2019-04-21 MED ORDER — PHENYLEPHRINE HCL (PRESSORS) 10 MG/ML IV SOLN
INTRAVENOUS | Status: DC | PRN
  Administered 2019-04-21: 120 ug via INTRAVENOUS
  Administered 2019-04-21: 40 ug via INTRAVENOUS
  Administered 2019-04-21: 25 ug via INTRAVENOUS

## 2019-04-21 MED ORDER — HYDRALAZINE HCL 20 MG/ML IJ SOLN
INTRAMUSCULAR | Status: AC
  Filled 2019-04-21: qty 1

## 2019-04-21 MED ORDER — CEFAZOLIN SODIUM-DEXTROSE 2-3 GM-%(50ML) IV SOLR
INTRAVENOUS | Status: DC | PRN
  Administered 2019-04-21: 2 g via INTRAVENOUS

## 2019-04-21 MED ORDER — PHENYLEPHRINE HCL-NACL 10-0.9 MG/250ML-% IV SOLN
INTRAVENOUS | Status: DC | PRN
  Administered 2019-04-21: 15 ug/min via INTRAVENOUS
  Administered 2019-04-21: 25 ug/min via INTRAVENOUS

## 2019-04-21 MED ORDER — PHENYLEPHRINE 40 MCG/ML (10ML) SYRINGE FOR IV PUSH (FOR BLOOD PRESSURE SUPPORT)
PREFILLED_SYRINGE | INTRAVENOUS | Status: AC
  Filled 2019-04-21: qty 10

## 2019-04-21 MED ORDER — ONDANSETRON HCL 4 MG/2ML IJ SOLN
INTRAMUSCULAR | Status: AC
  Filled 2019-04-21: qty 2

## 2019-04-21 MED ORDER — DOCUSATE SODIUM 100 MG PO CAPS
100.0000 mg | ORAL_CAPSULE | Freq: Every day | ORAL | Status: DC
  Administered 2019-04-22 – 2019-04-25 (×4): 100 mg via ORAL
  Filled 2019-04-21 (×4): qty 1

## 2019-04-21 MED ORDER — ALUM & MAG HYDROXIDE-SIMETH 200-200-20 MG/5ML PO SUSP: 15.0000 mL | ORAL | Status: DC | PRN

## 2019-04-21 SURGICAL SUPPLY — 54 items
CANISTER SUCT 3000ML PPV (MISCELLANEOUS) ×3 IMPLANT
CATH ROBINSON RED A/P 18FR (CATHETERS) ×3 IMPLANT
CLIP VESOCCLUDE MED 24/CT (CLIP) ×3 IMPLANT
CLIP VESOCCLUDE SM WIDE 24/CT (CLIP) ×3 IMPLANT
COVER PROBE W GEL 5X96 (DRAPES) IMPLANT
COVER TRANSDUCER ULTRASND GEL (DRAPE) ×3 IMPLANT
COVER WAND RF STERILE (DRAPES) ×1 IMPLANT
DERMABOND ADVANCED (GAUZE/BANDAGES/DRESSINGS) ×2
DERMABOND ADVANCED .7 DNX12 (GAUZE/BANDAGES/DRESSINGS) ×1 IMPLANT
DRAIN CHANNEL 15F RND FF W/TCR (WOUND CARE) IMPLANT
ELECT REM PT RETURN 9FT ADLT (ELECTROSURGICAL) ×3
ELECTRODE REM PT RTRN 9FT ADLT (ELECTROSURGICAL) ×1 IMPLANT
EVACUATOR SILICONE 100CC (DRAIN) IMPLANT
GLOVE BIO SURGEON STRL SZ7.5 (GLOVE) ×3 IMPLANT
GLOVE BIOGEL PI IND STRL 8 (GLOVE) ×1 IMPLANT
GLOVE BIOGEL PI INDICATOR 8 (GLOVE) ×2
GOWN STRL REUS W/ TWL LRG LVL3 (GOWN DISPOSABLE) ×2 IMPLANT
GOWN STRL REUS W/ TWL XL LVL3 (GOWN DISPOSABLE) ×2 IMPLANT
GOWN STRL REUS W/TWL LRG LVL3 (GOWN DISPOSABLE) ×4
GOWN STRL REUS W/TWL XL LVL3 (GOWN DISPOSABLE) ×4
HEMOSTAT SNOW SURGICEL 2X4 (HEMOSTASIS) ×2 IMPLANT
IV ADAPTER SYR DOUBLE MALE LL (MISCELLANEOUS) IMPLANT
KIT BASIN OR (CUSTOM PROCEDURE TRAY) ×3 IMPLANT
KIT SHUNT ARGYLE CAROTID ART 6 (VASCULAR PRODUCTS) IMPLANT
KIT TURNOVER KIT B (KITS) ×3 IMPLANT
LOOP VESSEL MINI RED (MISCELLANEOUS) IMPLANT
NDL HYPO 25GX1X1/2 BEV (NEEDLE) IMPLANT
NDL SPNL 20GX3.5 QUINCKE YW (NEEDLE) IMPLANT
NEEDLE HYPO 25GX1X1/2 BEV (NEEDLE) IMPLANT
NEEDLE SPNL 20GX3.5 QUINCKE YW (NEEDLE) IMPLANT
NS IRRIG 1000ML POUR BTL (IV SOLUTION) ×7 IMPLANT
PACK CAROTID (CUSTOM PROCEDURE TRAY) ×3 IMPLANT
PAD ARMBOARD 7.5X6 YLW CONV (MISCELLANEOUS) ×6 IMPLANT
PATCH VASC XENOSURE 1CMX6CM (Vascular Products) ×2 IMPLANT
PATCH VASC XENOSURE 1X6 (Vascular Products) IMPLANT
POSITIONER HEAD DONUT 9IN (MISCELLANEOUS) ×3 IMPLANT
SHUNT CAROTID BYPASS 10 (VASCULAR PRODUCTS) ×2 IMPLANT
SHUNT CAROTID BYPASS 12FRX15.5 (VASCULAR PRODUCTS) IMPLANT
SPONGE SURGIFOAM ABS GEL 100 (HEMOSTASIS) IMPLANT
STOPCOCK 4 WAY LG BORE MALE ST (IV SETS) IMPLANT
SUT ETHILON 3 0 PS 1 (SUTURE) IMPLANT
SUT MNCRL AB 4-0 PS2 18 (SUTURE) ×3 IMPLANT
SUT PROLENE 5 0 C 1 24 (SUTURE) ×3 IMPLANT
SUT PROLENE 6 0 BV (SUTURE) ×7 IMPLANT
SUT SILK 3 0 (SUTURE)
SUT SILK 3-0 18XBRD TIE 12 (SUTURE) IMPLANT
SUT VIC AB 2-0 CT1 27 (SUTURE) ×2
SUT VIC AB 2-0 CT1 TAPERPNT 27 (SUTURE) ×1 IMPLANT
SUT VIC AB 3-0 SH 27 (SUTURE) ×2
SUT VIC AB 3-0 SH 27X BRD (SUTURE) ×1 IMPLANT
SYR CONTROL 10ML LL (SYRINGE) IMPLANT
TOWEL GREEN STERILE (TOWEL DISPOSABLE) ×3 IMPLANT
TUBING ART PRESS 48 MALE/FEM (TUBING) IMPLANT
WATER STERILE IRR 1000ML POUR (IV SOLUTION) ×3 IMPLANT

## 2019-04-21 NOTE — Anesthesia Procedure Notes (Signed)
Arterial Line Insertion Start/End12/17/2020 7:15 AM Performed by: Trinna Post., CRNA, CRNA  Patient location: Pre-op. Preanesthetic checklist: patient identified, IV checked, site marked, risks and benefits discussed, surgical consent, monitors and equipment checked, pre-op evaluation, timeout performed and anesthesia consent Lidocaine 1% used for infiltration Left, radial was placed Catheter size: 20 G Hand hygiene performed  and maximum sterile barriers used   Attempts: 1 Procedure performed without using ultrasound guided technique. Following insertion, dressing applied and Biopatch. Post procedure assessment: normal  Patient tolerated the procedure well with no immediate complications.

## 2019-04-21 NOTE — Progress Notes (Signed)
[facility]  Subjective  - No new neurologic events overnight.   Objective (!) 166/71 (!) 55 97.6 F (36.4 C) (Oral) 18 100%  Intake/Output Summary (Last 24 hours) at 04/21/2019 0741 Last data filed at 04/21/2019 0357 Gross per 24 hour  Intake 150 ml  Output 2350 ml  Net -2200 ml    Left upper and lower extremity 4/5 Right upper and lower extremity 5/5 CN II-XII grossly intact  Laboratory Lab Results: Recent Labs    04/18/19 1234 04/21/19 0407  WBC 3.8* 5.1  HGB 12.6 11.7*  HCT 38.8 35.0*  PLT 120* 120*   BMET Recent Labs    04/18/19 1234 04/21/19 0407  NA 144 144  K 3.5 3.0*  CL 114* 115*  CO2 22 20*  GLUCOSE 88 94  BUN 15 11  CREATININE 1.01* 0.99  CALCIUM 8.9 8.7*    COAG Lab Results  Component Value Date   INR 1.0 04/18/2019   INR 1.05 08/24/2012   No results found for: PTT  Assessment/Planning:  Plan right carotid endarterectomy for symptomatic right carotid stenosis ~60%.  Risks and benefits again discussed with patient.  Marty Heck 04/21/2019 7:41 AM --

## 2019-04-21 NOTE — Transfer of Care (Signed)
Immediate Anesthesia Transfer of Care Note  Patient: Patricia Stanley  Procedure(s) Performed: ENDARTERECTOMY CAROTID (Right Neck)  Patient Location: PACU  Anesthesia Type:General  Level of Consciousness: awake, alert  and oriented  Airway & Oxygen Therapy: Patient Spontanous Breathing and Patient connected to face mask oxygen  Post-op Assessment: Report given to RN and Post -op Vital signs reviewed and stable  Post vital signs: Reviewed and stable  Last Vitals:  Vitals Value Taken Time  BP    Temp    Pulse    Resp    SpO2      Last Pain:  Vitals:   04/21/19 0357  TempSrc: Oral  PainSc:       Patients Stated Pain Goal: 0 (62/13/08 6578)  Complications: No apparent anesthesia complications

## 2019-04-21 NOTE — Anesthesia Preprocedure Evaluation (Signed)
Anesthesia Evaluation  Patient identified by MRN, date of birth, ID band Patient awake    Reviewed: Allergy & Precautions, NPO status , Patient's Chart, lab work & pertinent test results  Airway Mallampati: II  TM Distance: >3 FB Neck ROM: Full    Dental  (+) Edentulous Upper   Pulmonary former smoker,    Pulmonary exam normal breath sounds clear to auscultation       Cardiovascular hypertension, Pt. on home beta blockers and Pt. on medications Normal cardiovascular exam Rhythm:Regular Rate:Normal  ECG: SR, rate 67  ECHO: 1. Left ventricular ejection fraction, by visual estimation, is 60 to 65%. The left ventricle has normal function. There is no left ventricular hypertrophy. 2. Left ventricular diastolic parameters are consistent with Grade II diastolic dysfunction (pseudonormalization). 3. The left ventricle has no regional wall motion abnormalities. 4. Global right ventricle has normal systolic function.The right ventricular size is normal. No increase in right ventricular wall thickness. 5. Left atrial size was normal. 6. Right atrial size was normal. 7. The mitral valve is normal in structure. No evidence of mitral valve regurgitation. No evidence of mitral stenosis. 8. The tricuspid valve is normal in structure. Tricuspid valve regurgitation is trivial. 9. The aortic valve is normal in structure. Aortic valve regurgitation is not visualized. No evidence of aortic valve sclerosis or stenosis. 10. The pulmonic valve was normal in structure. Pulmonic valve regurgitation is not visualized. 11. Normal pulmonary artery systolic pressure. 12. The inferior vena cava is normal in size with greater than 50% respiratory variability, suggesting right atrial pressure of 3 mmHg.   Neuro/Psych  Headaches, PSYCHIATRIC DISORDERS Anxiety Depression CVA (Left-sided weakness), Residual Symptoms    GI/Hepatic Neg liver ROS, GERD   Controlled and Medicated,  Endo/Other  negative endocrine ROS  Renal/GU negative Renal ROS     Musculoskeletal negative musculoskeletal ROS (+)   Abdominal   Peds  Hematology  (+) anemia , HLD   Anesthesia Other Findings RIGHT CAROTID ARTERY STENOSIS  Reproductive/Obstetrics                             Anesthesia Physical Anesthesia Plan  ASA: III  Anesthesia Plan: General   Post-op Pain Management:    Induction: Intravenous  PONV Risk Score and Plan: 3 and Ondansetron, Dexamethasone and Treatment may vary due to age or medical condition  Airway Management Planned: Oral ETT  Additional Equipment: Arterial line  Intra-op Plan:   Post-operative Plan: Extubation in OR  Informed Consent: I have reviewed the patients History and Physical, chart, labs and discussed the procedure including the risks, benefits and alternatives for the proposed anesthesia with the patient or authorized representative who has indicated his/her understanding and acceptance.     Dental advisory given  Plan Discussed with: CRNA  Anesthesia Plan Comments:         Anesthesia Quick Evaluation

## 2019-04-21 NOTE — Progress Notes (Signed)
PROGRESS NOTE  Patricia Stanley HYQ:[id number] DOB: 03-16-1946 DOA: 04/18/2019 PCP: Josetta Huddle, MD  Brief History   Patricia Standlee Gregoryis a 73 y.o.femalewith medical history significant ofhypertension, migraine headaches, depression, GERD being brought to the hospital via EMS for evaluation of left-sided weakness. In the ER Brain MRI showing a 1.4 cm acute infarct within the right corona radiata and she was admitted for further stroke workup. Started on daily ASA, TTE pending and CTA head and neck shows extensive atherosclerotic changes as well as significant stenosis in the right ICA.   Vascular surgery has been consulted. The patient underwent CEA for Right Carotid stenosis with Dr. Carlis Abbott on 04/21/2019.  Consultants   Neurology  PM & R  Vascular Surgery  Procedures   None  Antibiotics   Anti-infectives (From admission, onward)   Start     Dose/Rate Route Frequency Ordered Stop   04/21/19 1600  ceFAZolin (ANCEF) IVPB 2g/100 mL premix     2 g 200 mL/hr over 30 Minutes Intravenous  Once 04/21/19 1340       Subjective  The patient is resting comfortably. She is having some post operative nausea and vomiting. Monitor.  Objective   Vitals:  Vitals:   04/21/19 1350 04/21/19 1400  BP: 113/65 108/69  Pulse: 64 61  Resp: 11 13  Temp: 98 F (36.7 C)   SpO2: 99% 100%   Exam:  Constitutional:   The patient is awake, alert, and oriented x 3. Mild distress from nausea. Respiratory:   No increased work of breathing.  No wheezes, rales, or rhonchi  No tactile fremitus Cardiovascular:   Regular rate and rhythm  No murmurs, ectopy, or gallups.  No lateral PMI. No thrills. Abdomen:   Abdomen is soft, non-tender, non-distended  No hernias, masses, or organomegaly  Hypoactive bowel sounds.  Musculoskeletal:   No cyanosis, clubbing, or edema Skin:   No rashes, lesions, ulcers  palpation of skin: no induration or nodules Neurologic:   Left sided facial  droop and weakness.   Psychiatric:   Mental status o Mood, affect appropriate o Orientation to person, place, time   judgment and insight appear intact  I have personally reviewed the following:   Today's Data   Vitals, BMP, CBC  Imaging   Carotid dopplers  Scheduled Meds:  aspirin  300 mg Rectal Daily   Or   aspirin  325 mg Oral Daily   atenolol  50 mg Oral QPM   atorvastatin  80 mg Oral q1800   clonazePAM  1 mg Oral q morning - 10a   clonazePAM  2 mg Oral QHS   clopidogrel  75 mg Oral Daily   [START ON 04/22/2019] docusate sodium  100 mg Oral Daily   [START ON 04/22/2019] enoxaparin (LOVENOX) injection  30 mg Subcutaneous Q24H   [START ON 04/22/2019] pantoprazole  40 mg Oral Daily   pneumococcal 23 valent vaccine  0.5 mL Intramuscular Tomorrow-1000   Continuous Infusions:  sodium chloride 75 mL/hr at 04/21/19 1404   sodium chloride      ceFAZolin (ANCEF) IV     magnesium sulfate bolus IVPB      Principal Problem:   Acute CVA (cerebrovascular accident) ([place]) Active Problems:   Migraines   HTN (hypertension)   Weight loss, unintentional   Anxiety   LOS: 3 days   A & P   Acute ischemic stroke:  Patient presented with a3-day history of left-sided weakness.Brain MRI showing a 1.4 cm acute infarct within the right  corona radiata. Neurology was consulted. CTA head and neck demonstrated significant stenosis of the Rt ICA. Vascular consult was called. Dr. Carlis Abbott will take the patient for CEA tomorrow am. Echocardiogram demonstrates LVEF of 60-65%. No LVH with Grade II Diastolic dysfunction. There are no wall motion abnormalities. No valvular abnormalities. No intracardiac sources of emboli. The patient is receiving ASA 325 mg daily, atorvastatin 80 mg daily. Blood pressure medications have been restarted.  Carotid stenosis: Right ICA stenosis. Patient has undergone CEA of right carotid today. She has tolerated it well, despite some post-operative  nausea and vomiting. Vascular surgery's assistance is greatly appreciated.  Uncontrolled hypertension: Per husband, patient has not had regular follow-up with her PCP.Blood pressure is elevated with systolic up to 937J.Blood pressures today are normotensive. Her home doses of atenolol and Klonopin have been restarted. Hydralazine is available as needed for SBP greater than 180.  Unintentional weight loss: Per husband, patient has had loss of appetite, intermittent diarrhea, and significant unintentional weight loss over the past 1 year and has not had any recent outpatient work-up done. Per chart, 16 kg weight loss since 07/2017.She is a former smoker.Per chart, patient was last seen by GI in July 2017 for evaluation of chronic watery diarrhea and unintentional weight loss.Colonoscopy and EGD were done at that time. Colonoscopy revealed normal colon and biopsiesshowing benign colonic mucosa. EGD showing normal stomach, esophagus, and duodenum. Small intestinal biopsy showing benign small bowel mucosa and stomach biopsy showing essentially unremarkable gastric type mucosa. CXR performed on 04/19/2019 demonstrated hype-rexapansion and chronic bronchitic changes without superimposed acute cardiopulmonary disease and aortic atherosclerosis. TSH  - nl. ESR - nl. CRP - nl. HIV - nr. FOBT pending. The patient will need a GI follow-up after discharge.  Anxiety: Continue home Klonopin.  History of migraine headaches: Continue home Topamax.  I have seen and examined this patient myself. I have spent 32 minutes in her evaluation and care.   DVT prophylaxis:Lovenox Code Status:Partial code (NOintubation/mechanical ventilation) Family Communication:None available Disposition Plan:CIR  Patricia Lomba, DO [facility] Direct contact: see [email or web address]  7PM-7AM contact night coverage as above 04/20/2019, 5:32 PM  LOS: 2 days

## 2019-04-21 NOTE — Progress Notes (Signed)
STROKE TEAM PROGRESS NOTE   INTERVAL HISTORY Pt was seen after right CEA procedure.  Patient lying in bed, complains of mild right neck incision pain.  Husband at bedside.  Neuro stable, still has left hemiparesis arm more than leg.  Discussed with husband and patient, they would like to have some work-up for the weight loss done in the hospital.  Vitals:   04/21/19 1215 04/21/19 1300 04/21/19 1330 04/21/19 1350  BP:  (!) 107/58  113/65  Pulse: (!) 57 (!) 56  64  Resp: 11 13  11   Temp:   (!) 96.8 F (36 C) 98 F (36.7 C)  TempSrc:    Oral  SpO2: 97% 98%  99%  Weight:      Height:        CBC:  Recent Labs  Lab 04/18/19 1234 04/21/19 0407  WBC 3.8* 5.1  NEUTROABS 1.9 2.2  HGB 12.6 11.7*  HCT 38.8 35.0*  MCV 95.8 94.1  PLT 120* 120*    Basic Metabolic Panel:  Recent Labs  Lab 04/18/19 1234 04/21/19 0407  NA 144 144  K 3.5 3.0*  CL 114* 115*  CO2 22 20*  GLUCOSE 88 94  BUN 15 11  CREATININE 1.01* 0.99  CALCIUM 8.9 8.7*   Lipid Panel:     Component Value Date/Time   CHOL 152 04/19/2019 0414   TRIG 58 04/19/2019 0414   HDL 53 04/19/2019 0414   CHOLHDL 2.9 04/19/2019 0414   VLDL 12 04/19/2019 0414   LDLCALC 87 04/19/2019 0414   HgbA1c:  Lab Results  Component Value Date   HGBA1C 5.8 (H) 04/19/2019   Urine Drug Screen: No results found for: LABOPIA, COCAINSCRNUR, LABBENZ, AMPHETMU, THCU, LABBARB  Alcohol Level     Component Value Date/Time   ETH <10 04/18/2019 1234    IMAGING ECHOCARDIOGRAM COMPLETE  Result Date: 04/19/2019   ECHOCARDIOGRAM REPORT   Patient Name:   CYNITHIA HAKIMI Date of Exam: 04/19/2019 Medical Rec #:  [id number]        Height:       61.5 in Accession #:    [id number]       Weight:       93.5 lb Date of Birth:  1945/06/20         BSA:          1.38 m Patient Age:    62 years         BP:           97/60 mmHg Patient Gender: F                HR:           56 bpm. Exam Location:  Inpatient Procedure: 2D Echo, Cardiac Doppler and Color  Doppler Indications:    Stroke 434.91  History:        Patient has no prior history of Echocardiogram examinations.                 Stroke; Risk Factors:Hypertension and Former Smoker.  Sonographer:    Paulita Fujita RDCS Referring Phys: [id number] [place]  1. Left ventricular ejection fraction, by visual estimation, is 60 to 65%. The left ventricle has normal function. There is no left ventricular hypertrophy.  2. Left ventricular diastolic parameters are consistent with Grade II diastolic dysfunction (pseudonormalization).  3. The left ventricle has no regional wall motion abnormalities.  4. Global right ventricle has normal systolic function.The right ventricular size is normal.  No increase in right ventricular wall thickness.  5. Left atrial size was normal.  6. Right atrial size was normal.  7. The mitral valve is normal in structure. No evidence of mitral valve regurgitation. No evidence of mitral stenosis.  8. The tricuspid valve is normal in structure. Tricuspid valve regurgitation is trivial.  9. The aortic valve is normal in structure. Aortic valve regurgitation is not visualized. No evidence of aortic valve sclerosis or stenosis. 10. The pulmonic valve was normal in structure. Pulmonic valve regurgitation is not visualized. 11. Normal pulmonary artery systolic pressure. 12. The inferior vena cava is normal in size with greater than 50% respiratory variability, suggesting right atrial pressure of 3 mmHg. FINDINGS  Left Ventricle: Left ventricular ejection fraction, by visual estimation, is 60 to 65%. The left ventricle has normal function. The left ventricle has no regional wall motion abnormalities. There is no left ventricular hypertrophy. Left ventricular diastolic parameters are consistent with Grade II diastolic dysfunction (pseudonormalization). Normal left atrial pressure. Right Ventricle: The right ventricular size is normal. No increase in right ventricular wall thickness.  Global RV systolic function is has normal systolic function. The tricuspid regurgitant velocity is 1.74 m/s, and with an assumed right atrial pressure  of 3 mmHg, the estimated right ventricular systolic pressure is normal at 15.1 mmHg. Left Atrium: Left atrial size was normal in size. Right Atrium: Right atrial size was normal in size Pericardium: There is no evidence of pericardial effusion. Mitral Valve: The mitral valve is normal in structure. No evidence of mitral valve regurgitation. No evidence of mitral valve stenosis by observation. Tricuspid Valve: The tricuspid valve is normal in structure. Tricuspid valve regurgitation is trivial. Aortic Valve: The aortic valve is normal in structure. Aortic valve regurgitation is not visualized. The aortic valve is structurally normal, with no evidence of sclerosis or stenosis. Pulmonic Valve: The pulmonic valve was normal in structure. Pulmonic valve regurgitation is not visualized. Pulmonic regurgitation is not visualized. Aorta: The aortic root, ascending aorta and aortic arch are all structurally normal, with no evidence of dilitation or obstruction. Venous: The inferior vena cava is normal in size with greater than 50% respiratory variability, suggesting right atrial pressure of 3 mmHg. IAS/Shunts: No atrial level shunt detected by color flow Doppler. There is no evidence of a patent foramen ovale. No ventricular septal defect is seen or detected. There is no evidence of an atrial septal defect.  LEFT VENTRICLE PLAX 2D LVIDd:         3.57 cm       Diastology LVIDs:         2.39 cm       LV e' lateral:   8.49 cm/s LV PW:         0.78 cm       LV E/e' lateral: 9.6 LV IVS:        0.78 cm       LV e' medial:    5.98 cm/s LVOT diam:     1.40 cm       LV E/e' medial:  13.6 LV SV:         33 ml LV SV Index:   24.77 LVOT Area:     1.54 cm  LV Volumes (MOD) LV area d, A2C:    21.40 cm LV area d, A4C:    20.40 cm LV area s, A2C:    12.20 cm LV area s, A4C:    11.60 cm  LV major d, A2C:  6.47 cm LV major d, A4C:   6.42 cm LV major s, A2C:   5.38 cm LV major s, A4C:   5.45 cm LV vol d, MOD A2C: 58.5 ml LV vol d, MOD A4C: 54.0 ml LV vol s, MOD A2C: 23.1 ml LV vol s, MOD A4C: 21.0 ml LV SV MOD A2C:     35.4 ml LV SV MOD A4C:     54.0 ml LV SV MOD BP:      34.2 ml RIGHT VENTRICLE RV S prime:     12.80 cm/s TAPSE (M-mode): 2.1 cm LEFT ATRIUM             Index       RIGHT ATRIUM          Index LA diam:        2.70 cm 1.96 cm/m  RA Area:     8.86 cm LA Vol (A2C):   24.6 ml 17.89 ml/m RA Volume:   16.30 ml 11.85 ml/m LA Vol (A4C):   37.7 ml 27.41 ml/m LA Biplane Vol: 32.1 ml 23.34 ml/m  AORTIC VALVE LVOT Vmax:   106.00 cm/s LVOT Vmean:  71.200 cm/s LVOT VTI:    0.270 m  AORTA Ao Root diam: 2.70 cm MITRAL VALVE                        TRICUSPID VALVE MV Area (PHT): 2.95 cm             TR Peak grad:   12.1 mmHg MV PHT:        74.53 msec           TR Vmax:        174.00 cm/s MV Decel Time: 257 msec MV E velocity: 81.40 cm/s 103 cm/s  SHUNTS MV A velocity: 63.40 cm/s 70.3 cm/s Systemic VTI:  0.27 m MV E/A ratio:  1.28       1.5       Systemic Diam: 1.40 cm  Mihai Croitoru MD Electronically signed by Thurmon Fair MD Signature Date/Time: 04/19/2019/3:35:50 PM    Final     PHYSICAL EXAM  Temp:  [96.8 F (36 C)-98.7 F (37.1 C)] 98 F (36.7 C) (12/17 1350) Pulse Rate:  [55-65] 64 (12/17 1350) Resp:  [10-18] 11 (12/17 1350) BP: (98-169)/(49-72) 113/65 (12/17 1350) SpO2:  [97 %-100 %] 99 % (12/17 1350) Arterial Line BP: (120-147)/(47-56) 145/51 (12/17 1350) Weight:  [41.5 kg] 41.5 kg (12/17 0358)  General - Well nourished, well developed, in no apparent distress.  Right neck incision clean and dry.  Ophthalmologic - fundi not visualized due to noncooperation.  Cardiovascular - Regular rhythm and rate.  Mental Status -  Level of arousal and orientation to time, place, and person were intact. Language including expression, naming, repetition, comprehension was assessed  and found intact. Fund of Knowledge was assessed and was intact.  Cranial Nerves II - XII - II - Visual field intact OU. III, IV, VI - Extraocular movements intact. V - Facial sensation intact bilaterally. VII - Facial movement intact bilaterally. VIII - Hearing & vestibular intact bilaterally. X - Palate elevates symmetrically. XI - Chin turning & shoulder shrug intact bilaterally. XII - Tongue protrusion intact.  Motor Strength - The patient's strength was normal in RUE and RLE however, LUE3/5 and LLE 4/5.  Bulk was normal and fasciculations were absent.   Motor Tone - Muscle tone was assessed at the neck and appendages and was  normal.  Reflexes - The patient's reflexes were symmetrical in all extremities and she had no pathological reflexes.  Sensory - Light touch, temperature/pinprick were assessed and were symmetrical.    Coordination - The patient had normal movements in the right hand with no ataxia or dysmetria.  Tremor was absent.  Gait and Station - deferred.   ASSESSMENT/PLAN Ms. Patricia Stanley is a 73 y.o. female with history of migraines, hypertension, depression, gastroesophageal reflux and ongoing weight loss with over more than a year of unexplainable weight loss for which she has received a GI work-up, presenting with 2 to 3 days worth of left-sided weakness and difficulty with speech.   Stroke:  right CR infarct secondary to small vessel disease in setting of large vessel disease source  CT head Small vessel disease. Atrophy. Old lacunes. No acute abnormality.   MRI  R corona radiata infarct. Multiple old lacunes L frontal lobe, B basal ganglia, B thalami. Small vessel disease.   CTA head & neck no LVO. Extensive plaque R ICA w/ 65-70% stenosis. Multifocal moderate L CCA/origin 50% stenosis. extenvie atherosclerosis w/ severe stenosis supraclinoid L ICA, moderate proximal R M1 and L M1, moderate to severe multifocal L V4 stenoses. B PCAs extensive  atherosclerosis. 2mm outpouching R PCA ? Small aneurysm.  Carotid Doppler right ICA 40-59% stenosis  2D Echo EF 60-65%  LDL 87  HgbA1c 5.8  Lovenox 30 mg sq daily for VTE prophylaxis  No antithrombotic prior to admission, now on aspirin 325 mg daily and clopidogrel 75 mg daily. Continue DAPT for 3 months and then ASA alone.  Therapy recommendations:  CIR   Disposition:  pending   Symptomatic right ICA stenosis  CTA head & neck Extensive plaque R ICA w/ 65-70% stenosis.   Carotid Doppler right ICA 40-59% stenosis  R CEA by [facility] Dr Chestine Sporelark 12/17  Hypotension  Hx of hypertension  BP improved  Clonidine discontinued . Continue NS IVF @ 75cc/h  . BP goal < 160 post CEA . Long-term BP goal normotensive  Hyperlipidemia  Home meds:  zocor 40  Now on lipitor 80  LDL 87, goal < 70  Continue statin at discharge  Depression   As per husband, the depression has been going on for years  She followed with psychiatry for a while and then lost follow up  Pt had no motivation at home. 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)  Could be part of the reason for her loss of appetite and weight loss  Unintentional weight loss  Likely multifactorial  Could be related to long term depression, anorexia nervosa  Had GI work up before but failed follow up  Pt stated that she is not on topamax PTA - d/c topamax   Pan CT - pending to rule out maligancy  Other Stroke Risk Factors  Advanced age  Former cigarette smoker  Migraines not on topamax PTA as per pt   Other Active Problems  [facility]xiety [facility]  Hospital day # 3   Marvel PlanJindong Yan Pankratz, MD PhD Stroke Neurology 04/21/2019 2:00 PM  To contact Stroke Continuity provider, please refer to [email or web address]. After hours, contact General Neurology

## 2019-04-21 NOTE — Anesthesia Procedure Notes (Signed)
Procedure Name: Intubation Date/Time: 04/21/2019 8:17 AM Performed by: Trinna Post., CRNA Pre-anesthesia Checklist: Patient identified, Emergency Drugs available, Suction available and Patient being monitored Patient Re-evaluated:Patient Re-evaluated prior to induction Oxygen Delivery Method: Circle system utilized Preoxygenation: Pre-oxygenation with 100% oxygen Induction Type: IV induction Ventilation: Mask ventilation without difficulty Laryngoscope Size: Mac and 3 Grade View: Grade I Tube type: Oral Tube size: 7.0 mm Number of attempts: 1 Airway Equipment and Method: Stylet and Oral airway Placement Confirmation: ETT inserted through vocal cords under direct vision,  positive ETCO2 and breath sounds checked- equal and bilateral Secured at: 18 cm Tube secured with: Tape Dental Injury: Teeth and Oropharynx as per pre-operative assessment  Comments: Atraumatic intubation by [place]

## 2019-04-21 NOTE — Op Note (Signed)
OPERATIVE NOTE  PROCEDURE:   1.  right carotid endarterectomy with bovine patch angioplasty 2.  right intraoperative carotid ultrasound  PRE-OPERATIVE DIAGNOSIS: right symptomatic carotid stenosis (~60%)  POST-OPERATIVE DIAGNOSIS: same as above   SURGEON: Marty Heck, MD  ASSISTANT(S): Risa Grill, PA  ANESTHESIA: general  ESTIMATED BLOOD LOSS: Minimal  FINDING(S): 1. Right carotid plaque extended approximately 2 cm distal to carotid bifurcation. 2.  Continuous Doppler audible flow signatures are appropriate for each carotid artery after endarterectomy and patch angioplasty. 3.  No evidence of intimal flap visualized on transverse or longitudinal ultrasonography, some more proximal common carotid disease.  SPECIMEN(S):  Carotid plaque (sent to Pathology)  INDICATIONS:   Patricia Stanley is a 73 y.o. female who presents with right symptomatic carotid stenosis approximately 60% with left arm and leg weakness. I discussed with the patient the risks, benefits, and alternatives to carotid endarterectomy.  The patient is aware that the risks of carotid endarterectomy include but are not limited to: bleeding, infection, stroke, myocardial infarction, death, cranial nerve injuries both temporary and permanent, neck hematoma, possible airway compromise, labile blood pressure post-operatively, cerebral hyperperfusion syndrome, and possible need for additional interventions in the future. The patient is aware of the risks and agrees to proceed forward with the procedure.  DESCRIPTION: After full informed written consent was obtained from the patient, the patient was brought back to the operating room and placed supine upon the operating table.  Prior to induction, the patient received IV antibiotics.  After obtaining adequate anesthesia, the patient was placed into semi-Fowler position with a shoulder roll in place and the patient's neck slightly hyperextended and rotated away from  the surgical site.  The patient was prepped in the standard fashion for a right carotid endarterectomy.  I made an incision anterior to the sternocleidomastoid muscle and dissected down through the subcutaneous tissue.  The platysma was opened with electrocautery.  I then used Bovie cautery and blunt dissection to dissect through the underlying platysma and to mobilize the anterior border of the sternocleidomastoid as well as the internal jugular vein laterally.  The facial vein was ligated with 3-0 silk and surgical clips and divided.  After identifying the carotid artery I used Metzenbaum scissors to bluntly dissect the common carotid artery and then controlled this with a umbilical tape.  At this point in time the patient was given 100 units/kg of IV heparin and we checked an ACT to ensure it was greater than 250.  I then carried my dissection cephalad and mobilized the external carotid artery and superior thyroid artery and controlled each of these with a vessel loop.  I then dissected out the internal carotid artery well past the distal plaque.  The internal carotid artery was then controlled with a umbilical tape as well. I was careful to identify the vagus nerve between the internal jugular and common carotid and this was presereved.  I was also careful to identify and preserve the hypoglossal nerve and this was preserved.    Once our ACT was confirmed, I proceeded by clamping the internal carotid artery with a angled bulldog clamp first.  The proximal common carotid artery was controlled with a angled debakey clamp.  The external carotid was controlled with a vessel loop.  I subsequently opened the common carotid artery with an 11 blade scalpel in longitudinal fashion and extended the arteriotomy with Potts scissors onto the ICA past the distal plaque.   I then used a Garment/textile technologist and  performed a endarterectomy starting in the common carotid artery.  The external carotid artery was endarterectomized  with an eversion technique and I was careful to feather the distal ICA plaque.  The specimen was passed off the field. At this point a 10 [place] shunt was brought to the field and then initially placed distally into the ICA after removing the clamp and controlled with a umbilical tape. The shunt was back bleed with good flow.  I then placed the proximal end of the shunt in the common carotid artery and controlled this with a Rummel tourniquet. I then brought a bovine carotid patch on the field and this was sewn in place with a running anastomosis using a 6-0 Prolene distally and a 5-0 proximal.  The bovine patch was trimmed accordingly.  The shunt was removed just before completion of the patch.  The artery was flushed antegrade and retrograde prior to completion of the patch.  I also flushed the patch site with heparinized saline.  Once the patch was complete, I flushed up the external carotid artery first prior to releasing the internal carotid artery clamp.  An intraoperative duplex was performed that showed no evidence of any flaps.  Once I was happy with the intraoperative ultrasound the patient was given protamine for reversal.  I used Surgicel snow to get hemostasis around the patch.  Ultimately the platysma was closed in running fashion with 3-0 Vicryl.  The skin was closed with a running 4-0 Monocryl.  Dermabond was applied with a dry sterile dressing.  The patient was awakened from anesthesia with no new neurological deficit and taken to PACU in stable condition.  It should be noted she remains weak on the left sided as noted preoperatively, but she could raise her left leg and left arm off the bed.      COMPLICATIONS: None  CONDITION: Stable  Cephus Shelling, MD [facility] Office: [phone] Pager: [phone]  04/21/2019, 9:59 AM

## 2019-04-21 NOTE — Progress Notes (Signed)
  Day of Surgery Note    Subjective:  Right neck soreness   Vitals:   04/21/19 1215 04/21/19 1300  BP:  (!) 107/58  Pulse: (!) 57 (!) 56  Resp: 11 13  Temp:    SpO2: 97% 98%    Incisions:  Moderate ecchymosis, soft Extremities:  Moves all extremities; 4/5 on left, 5/5 on right Cardiac:  RRR Lungs:  Non labored Neuro: alert and oriented, tongue midline, speech clear   Assessment/Plan:  This is a 73 y.o. female who is s/p  right CEA  -Patient doing well in PACU. Moderate ecchymosis of incision unchanged since admission to PACU.   Risa Grill, PA-C 04/21/2019 1:36 PM [phone]

## 2019-04-21 NOTE — Anesthesia Postprocedure Evaluation (Signed)
Anesthesia Post Note  Patient: [name]  Procedure(s) Performed: ENDARTERECTOMY CAROTID (Right Neck)     Patient location during evaluation: PACU Anesthesia Type: General Level of consciousness: awake and alert Pain management: pain level controlled Vital Signs Assessment: post-procedure vital signs reviewed and stable Respiratory status: spontaneous breathing, nonlabored ventilation, respiratory function stable and patient connected to nasal cannula oxygen Cardiovascular status: blood pressure returned to baseline and stable Postop Assessment: no apparent nausea or vomiting Anesthetic complications: no    Last Vitals:  Vitals:   04/21/19 1350 04/21/19 1400  BP: 113/65 108/69  Pulse: 64 61  Resp: 11 13  Temp: 36.7 C   SpO2: 99% 100%    Last Pain:  Vitals:   04/21/19 1350  TempSrc: Oral  PainSc: Asleep                 Patricia Stanley

## 2019-04-22 ENCOUNTER — Inpatient Hospital Stay (HOSPITAL_COMMUNITY): Payer: Medicare HMO

## 2019-04-22 DIAGNOSIS — Z9889 Other specified postprocedural states: Secondary | ICD-10-CM

## 2019-04-22 LAB — CBC
HCT: 31.7 % — ABNORMAL LOW (ref 36.0–46.0)
Hemoglobin: 10.5 g/dL — ABNORMAL LOW (ref 12.0–15.0)
MCH: 31.1 pg (ref 26.0–34.0)
MCHC: 33.1 g/dL (ref 30.0–36.0)
MCV: 93.8 fL (ref 80.0–100.0)
Platelets: 113 10*3/uL — ABNORMAL LOW (ref 150–400)
RBC: 3.38 MIL/uL — ABNORMAL LOW (ref 3.87–5.11)
RDW: 12.8 % (ref 11.5–15.5)
WBC: 8.4 10*3/uL (ref 4.0–10.5)
nRBC: 0 % (ref 0.0–0.2)

## 2019-04-22 LAB — BASIC METABOLIC PANEL
Anion gap: 8 (ref 5–15)
BUN: 11 mg/dL (ref 8–23)
CO2: 19 mmol/L — ABNORMAL LOW (ref 22–32)
Calcium: 8.7 mg/dL — ABNORMAL LOW (ref 8.9–10.3)
Chloride: 115 mmol/L — ABNORMAL HIGH (ref 98–111)
Creatinine, Ser: 0.99 mg/dL (ref 0.44–1.00)
GFR calc Af Amer: >60 mL/min (ref >60)
GFR calc non Af Amer: 57 mL/min — ABNORMAL LOW (ref >60)
Glucose, Bld: 95 mg/dL (ref 70–99)
Potassium: 4.1 mmol/L (ref 3.5–5.1)
Sodium: 142 mmol/L (ref 135–145)

## 2019-04-22 MED ORDER — PANTOPRAZOLE SODIUM 40 MG PO TBEC
40.0000 mg | DELAYED_RELEASE_TABLET | Freq: Two times a day (BID) | ORAL | Status: DC
  Administered 2019-04-22 – 2019-04-25 (×7): 40 mg via ORAL
  Filled 2019-04-22 (×7): qty 1

## 2019-04-22 MED ORDER — IOHEXOL 300 MG/ML  SOLN: 50.0000 mL | Freq: Once | INTRAMUSCULAR | Status: DC | PRN

## 2019-04-22 MED ORDER — BISACODYL 10 MG RE SUPP
10.0000 mg | Freq: Once | RECTAL | Status: DC
  Filled 2019-04-22: qty 1

## 2019-04-22 MED ORDER — DOCUSATE SODIUM 100 MG PO CAPS: 200.0000 mg | ORAL_CAPSULE | Freq: Once | ORAL | Status: AC

## 2019-04-22 NOTE — Progress Notes (Addendum)
[facility]  Subjective  - Doing well from a post surgical stand point.   Objective (!) 162/68 (!) 58 98.1 F (36.7 C) (Oral) 16 98%  Intake/Output Summary (Last 24 hours) at 04/22/2019 0717 Last data filed at 04/22/2019 0600 Gross per 24 hour  Intake 3423.91 ml  Output 1575 ml  Net 1848.91 ml    Right neck ecchymosis without hematoma No tongue, smile is symmetric Left UE weakness 3/5, left LE 4/5 with out change Lungs non labored breathing   Assessment/Planning: POD # right CEA  Cont. Lipitor, plavix and ASA OK for rehabilitation from a vascular point of view  Roxy Horseman 04/22/2019 7:17 AM --  Laboratory Lab Results: Recent Labs    04/21/19 0407  WBC 5.1  HGB 11.7*  HCT 35.0*  PLT 120*   BMET Recent Labs    04/21/19 0407 04/22/19 0345  NA 144 142  K 3.0* 4.1  CL 115* 115*  CO2 20* 19*  GLUCOSE 94 95  BUN 11 11  CREATININE 0.99 0.99  CALCIUM 8.7* 8.7*    COAG Lab Results  Component Value Date   INR 1.0 04/18/2019   INR 1.05 08/24/2012   No results found for: PTT  I have seen and evaluated the patient. I agree with the PA note as documented above. POD#1 s/p R CEA fopr symptomatic 60% stenosis.  Left sided weakness at pre-op baseline following stroke.  Can lift left arm and leg off bed, but weak, 4/5.  Neck bruising but no overt hematoma.  Continue aspirin/plavix.  [place] for rehab from our standpoint.  Will arrange post-op check in clinic in 3 weeks.  Call vascular with questions or concerns.  Marty Heck, MD [facility] Office: [phone] Pager: [phone]

## 2019-04-22 NOTE — Progress Notes (Signed)
Physical Therapy Treatment Patient Details Name: Patricia Stanley MRN: [id number] DOB: [birth year] Today's Date: 04/22/2019    History of Present Illness Patricia Stanley is a 73 y.o. female with medical history significant of hypertension, migraine headaches, depression, GERD being brought to the hospital via EMS for evaluation of left-sided weakness. In the ER Brain MRI showing a 1.4 cm acute infarct within the right corona radiata.  Per vascular note pt declined carotid endarectomy ; however, pt reporting that she has changed her mind (RN was notified).    PT Comments    Patient received up in recliner, reports she is not feeling well, neck pain. Agrees to PT session. Patient requires mod assist to scoot in recliner in seated position. Left LE & UE strengthening exercises performed, cues needed for quality of movements and pacing. Declined standing or attempting to ambulate this visit.  She will continue to benefit from skilled PT while here to improve functional independence and improve strength on left side. CIR is still recommended, although per notes patient is declining CIR and wants to go home.         Follow Up Recommendations  CIR     Equipment Recommendations  Wheelchair (measurements PT);Wheelchair cushion (measurements PT);Other (comment);Rolling walker with 5" wheels    Recommendations for Other Services Rehab consult     Precautions / Restrictions Precautions Precautions: Fall Restrictions Weight Bearing Restrictions: No    Mobility  Bed Mobility               General bed mobility comments: patient received up in recliner  Transfers                 General transfer comment: she declined standing or attempting to walk today, reports she is not feeling well. Requires mod assist to scoot back into recliner in seated position.  Ambulation/Gait             General Gait Details: declines attempting   Stairs             Wheelchair  Mobility    Modified Rankin (Stroke Patients Only) Modified Rankin (Stroke Patients Only) Pre-Morbid Rankin Score: Slight disability Modified Rankin: Severe disability     Balance Overall balance assessment: Needs assistance Sitting-balance support: Feet supported Sitting balance-Leahy Scale: Fair                                      Cognition Arousal/Alertness: Awake/alert Behavior During Therapy: WFL for tasks assessed/performed Overall Cognitive Status: No family/caregiver present to determine baseline cognitive functioning Area of Impairment: Following commands;Safety/judgement;Problem solving                       Following Commands: Follows one step commands with increased time Safety/Judgement: Decreased awareness of safety;Decreased awareness of deficits Awareness: Intellectual Problem Solving: Slow processing;Decreased initiation;Requires verbal cues        Exercises Other Exercises Other Exercises: L LE strengthening: ap, heel slides, SLR, hip abd/add, LAQ, marching x 10 reps each. Cues needed to try to slow down and control the movements Other Exercises: L UE hand open closing, elbow flexion/ext    General Comments        Pertinent Vitals/Pain Pain Assessment: Faces Faces Pain Scale: Hurts little more Pain Location: neck Pain Descriptors / Indicators: Sore Pain Intervention(s): Monitored during session    Home Living  Prior Function            PT Goals (current goals can now be found in the care plan section) Acute Rehab PT Goals Patient Stated Goal: wants to go home PT Goal Formulation: With patient Time For Goal Achievement: 05/03/19 Potential to Achieve Goals: Fair Progress towards PT goals: Progressing toward goals    Frequency    Min 4X/week      PT Plan Current plan remains appropriate    Co-evaluation              AM-PAC PT "6 Clicks" Mobility   Outcome Measure  Help  needed turning from your back to your side while in a flat bed without using bedrails?: A Little Help needed moving from lying on your back to sitting on the side of a flat bed without using bedrails?: A Lot Help needed moving to and from a bed to a chair (including a wheelchair)?: A Lot Help needed standing up from a chair using your arms (e.g., wheelchair or bedside chair)?: A Lot Help needed to walk in hospital room?: Total Help needed climbing 3-5 steps with a railing? : Total 6 Click Score: 11    End of Session   Activity Tolerance: Patient tolerated treatment well Patient left: in chair;with call bell/phone within reach Nurse Communication: Mobility status PT Visit Diagnosis: Muscle weakness (generalized) (M62.81);Other abnormalities of gait and mobility (R26.89);Difficulty in walking, not elsewhere classified (R26.2);Hemiplegia and hemiparesis;Unsteadiness on feet (R26.81) Hemiplegia - Right/Left: Left Hemiplegia - dominant/non-dominant: Dominant Hemiplegia - caused by: Cerebral infarction     Time: 1020-1037 PT Time Calculation (min) (ACUTE ONLY): 17 min  Charges:  $Therapeutic Exercise: 8-22 mins                     Patricia Stanley, PT, GCS 04/22/19,10:48 AM

## 2019-04-22 NOTE — Progress Notes (Signed)
PROGRESS NOTE  Patricia Stanley YNW:[id number] DOB: [birth year] DOA: 04/18/2019 PCP: Josetta Huddle, MD  Brief History   Patricia Tenny Gregoryis a 73 y.o.femalewith medical history significant ofhypertension, migraine headaches, depression, GERD being brought to the hospital via EMS for evaluation of left-sided weakness. In the ER Brain MRI showing a 1.4 cm acute infarct within the right corona radiata and she was admitted for further stroke workup. Started on daily ASA, TTE pending and CTA head and neck shows extensive atherosclerotic changes as well as significant stenosis in the right ICA.   Vascular surgery has been consulted. The patient underwent CEA for Right Carotid stenosis with Dr. Carlis Abbott on 04/21/2019.   CTA of the chest abdomen and pelvis was obtained on 04/21/2019. It demonstrated thickening of the esophageal mucosa, and dilatation of the bile ducts. There was also possibly a pneumomediastinum, likely due to CEA yesterday. Consultants  . Neurology . PM & R . Vascular Surgery . Gastroenterology  Procedures  . CEA  Antibiotics   Anti-infectives (From admission, onward)   Start     Dose/Rate Route Frequency Ordered Stop   04/21/19 1600  ceFAZolin (ANCEF) IVPB 2g/100 mL premix     2 g 200 mL/hr over 30 Minutes Intravenous  Once 04/21/19 1340 04/21/19 1608     Subjective  The patient is resting comfortably. No new complaints.  Objective   Vitals:  Vitals:   04/22/19 0840 04/22/19 1240  BP: (!) 153/62 (!) 153/64  Pulse: 73 74  Resp: 12 15  Temp: 98.4 F (36.9 C) 98.2 F (36.8 C)  SpO2: 100% 100%   Exam:  Constitutional:  . The patient is awake, alert, and oriented x 3. Mild distress from nausea. Respiratory:  . No increased work of breathing. . No wheezes, rales, or rhonchi . No tactile fremitus Cardiovascular:  . Regular rate and rhythm . No murmurs, ectopy, or gallups. . No lateral PMI. No thrills. Abdomen:  . Abdomen is soft, non-tender, non-distended .  No hernias, masses, or organomegaly . Hypoactive bowel sounds.  Musculoskeletal:  . No cyanosis, clubbing, or edema Skin:  . No rashes, lesions, ulcers . palpation of skin: no induration or nodules Neurologic:  . Left sided facial droop and weakness. [name]  Psychiatric:  . Mental status o Mood, affect appropriate o Orientation to person, place, time  . judgment and insight appear intact  I have personally reviewed the following:   Today's Data  . Vitals, BMP, CBC  Imaging  . Carotid dopplers  Scheduled Meds: . aspirin  300 mg Rectal Daily   Or  . aspirin  325 mg Oral Daily  . atenolol  50 mg Oral QPM  . atorvastatin  80 mg Oral q1800  . bisacodyl  10 mg Rectal Once  . clonazePAM  1 mg Oral q morning - 10a  . clonazePAM  2 mg Oral QHS  . clopidogrel  75 mg Oral Daily  . docusate sodium  100 mg Oral Daily  . enoxaparin (LOVENOX) injection  30 mg Subcutaneous Q24H  . pantoprazole  40 mg Oral BID AC   Continuous Infusions: . sodium chloride 75 mL/hr at 04/22/19 0600  . sodium chloride    . magnesium sulfate bolus IVPB      Principal Problem:   Acute CVA (cerebrovascular accident) ([place]) Active Problems:   Migraines   HTN (hypertension)   Weight loss, unintentional   Anxiety   LOS: 4 days   A & P   Acute ischemic stroke:  Patient  presented with a3-day history of left-sided weakness.Brain MRI showing a 1.4 cm acute infarct within the right corona radiata. Neurology was consulted. CTA head and neck demonstrated significant stenosis of the Rt ICA. Vascular consult was called. Dr. Carlis Abbott will take the patient for CEA tomorrow am. Echocardiogram demonstrates LVEF of 60-65%. No LVH with Grade II Diastolic dysfunction. There are no wall motion abnormalities. No valvular abnormalities. No intracardiac sources of emboli. The patient is receiving ASA 325 mg daily, atorvastatin 80 mg daily. Blood pressure medications have been restarted.  Carotid stenosis: Right ICA stenosis.  Patient has undergone CEA of right carotid today. She has tolerated it well, despite some post-operative nausea and vomiting. Vascular surgery's assistance is greatly appreciated.  Esophageal thickening/Dilated bile ducts: Seen on CT of the chest abdomen and pelvis performed on 04/21/2019 to investigate possible oncological cause of her unintentional weight loss. Gastroenterology has been consulted.   Uncontrolled hypertension: Per husband, patient has not had regular follow-up with her PCP.Blood pressure is elevated with systolic up to 701T.Blood pressures today are a little higher than desirable. Will add low dose lisinopril.  Unintentional weight loss: Per husband, patient has had loss of appetite, intermittent diarrhea, and significant unintentional weight loss over the past 1 year and has not had any recent outpatient work-up done. Per chart, 16 kg weight loss since 07/2017.She is a former smoker.Per chart, patient was last seen by GI in July 2017 for evaluation of chronic watery diarrhea and unintentional weight loss.Colonoscopy and EGD were done at that time. Colonoscopy revealed normal colon and biopsiesshowing benign colonic mucosa. EGD showing normal stomach, esophagus, and duodenum. Small intestinal biopsy showing benign small bowel mucosa and stomach biopsy showing essentially unremarkable gastric type mucosa. CXR performed on 04/19/2019 demonstrated hype-rexapansion and chronic bronchitic changes without superimposed acute cardiopulmonary disease and aortic atherosclerosis. TSH  - nl. ESR - nl. CRP - nl. HIV - nr. FOBT pending. The patient will need a GI follow-up after discharge.  Anxiety: Continue home Klonopin.  History of migraine headaches: Continue home Topamax.  I have seen and examined this patient myself. I have spent 34 minutes in her evaluation and care. I haave spent more than 50% of this in the counseling of the patient and her daughter and in coordination of  care with gastroenterology.  DVT prophylaxis:Lovenox Code Status:Partial code (NOintubation/mechanical ventilation) Family Communication:None available Disposition Plan:CIR  Patricia Hoopingarner, DO [facility] Direct contact: see [email or web address]  7PM-7AM contact night coverage as above 04/22/2019, 4:00 PM  LOS: 2 days

## 2019-04-22 NOTE — Progress Notes (Signed)
STROKE TEAM PROGRESS NOTE   INTERVAL HISTORY Patient sitting in a chair, daughter at bedside, husband on Patricia phone.  CIR coordinator Patricia Stanley also at bedside.  Discussed with patient CIR needs vs. HH PT/OT. Pt right neck incision dry clean, with mild swelling. Pan CT showed distal esophagus thickening. GI consulted.   Vitals:   04/22/19 0205 04/22/19 0330 04/22/19 0625 04/22/19 0840  BP: (!) 128/54 (!) 153/67 (!) 162/68 (!) 153/62  Pulse:  (!) 58  73  Resp: Temp:  98.1 F (36.7 C)  98.4 F (36.9 C)  TempSrc:  Oral    SpO2:  98%  100%  Weight:      Height:        CBC:  Recent Labs  Lab 04/18/19 1234 04/21/19 0407 04/22/19 0345  WBC 3.8* 5.1 8.4  NEUTROABS 1.9 2.2  --   HGB 12.6 11.7* 10.5*  HCT 38.8 35.0* 31.7*  MCV 95.8 94.1 93.8  PLT 120* 120* 113*    Basic Metabolic Panel:  Recent Labs  Lab 04/21/19 0407 04/22/19 0345  NA 144 142  K 3.0* 4.1  CL 115* 115*  CO2 20* 19*  GLUCOSE 94 95  BUN 11 11  CREATININE 0.99 0.99  CALCIUM 8.7* 8.7*   Lipid Panel:     Component Value Date/Time   CHOL 152 04/19/2019 0414   TRIG 58 04/19/2019 0414   HDL 53 04/19/2019 0414   CHOLHDL 2.9 04/19/2019 0414   VLDL 12 04/19/2019 0414   LDLCALC 87 04/19/2019 0414   HgbA1c:  Lab Results  Component Value Date   HGBA1C 5.8 (H) 04/19/2019   Urine Drug Screen: No results found for: LABOPIA, COCAINSCRNUR, LABBENZ, AMPHETMU, THCU, LABBARB  Alcohol Level     Component Value Date/Time   ETH <10 04/18/2019 1234    IMAGING CT CHEST W CONTRAST  Result Date: 04/21/2019 CLINICAL DATA:  Unintentional weight loss. EXAM: CT CHEST, ABDOMEN, AND PELVIS WITH CONTRAST TECHNIQUE: Multidetector CT imaging of Patricia chest, abdomen and pelvis was performed following Patricia standard protocol during bolus administration of intravenous contrast. CONTRAST:  OMNIPAQUE IOHEXOL 300 MG/ML  SOLN COMPARISON:  12/27/2015 CT of Patricia pelvis. FINDINGS: CT CHEST FINDINGS Cardiovascular: Patricia heart  size is normal. There is no significant pericardial effusion. Coronary artery calcifications are noted. There is no large centrally located pulmonary embolism. Detection of smaller pulmonary emboli is limited by technique. There is no thoracic aortic aneurysm. Thoracic aortic calcifications are noted. Mediastinum/Nodes: --No mediastinal or hilar lymphadenopathy. --No axillary lymphadenopathy. --No supraclavicular lymphadenopathy. --Normal thyroid gland. --there is diffuse wall thickening of Patricia lower third of Patricia esophagus. There is pneumomediastinum in Patricia upper mediastinum and low right neck. There is fluid tracking within Patricia fat planes of Patricia upper mediastinum and right lower neck. There may be some overlying skin thickening involving Patricia low right neck. Patricia exact cause of these findings is not clearly identified. Lungs/Pleura: There is scarring and bronchiectasis at Patricia lung bases bilaterally. This has slightly progressed since Patricia prior study. There is no pneumothorax. No large pleural effusion. There is a small ground-glass airspace opacity in Patricia left lung apex measuring approximately 3 mm (axial series 5, image 30). This is felt to be post infectious in etiology. Reticulations bilaterally which have slightly progressed since Patricia prior study. There is questionable honeycombing involving Patricia right middle lobe and right lower lobe. Musculoskeletal: No chest wall abnormality. No acute or significant osseous findings. CT ABDOMEN PELVIS FINDINGS Hepatobiliary: Again  identified is a probable portal venous malformation in Patricia left hepatic lobe, similar to prior study. Normal gallbladder.Patricia common bile duct is somewhat dilated. Pancreas: Patricia pancreatic duct is slightly dilated which has progressed since 2017. Spleen: No splenic laceration or hematoma. Adrenals/Urinary Tract: --Adrenal glands: No adrenal hemorrhage. --Right kidney/ureter: Patricia right kidney is slightly atrophic. --Left kidney/ureter: Patricia left kidney  is slightly atrophic. There is a nonobstructing stone in Patricia upper pole. There is no hydronephrosis. --Urinary bladder: Unremarkable. Stomach/Bowel: --Stomach/Duodenum: No hiatal hernia or other gastric abnormality. Normal duodenal course and caliber. --Small bowel: No dilatation or inflammation. --Colon: Patricia stool burden is large. --Appendix: Not visualized. No right lower quadrant inflammation or free fluid. Vascular/Lymphatic: Atherosclerotic calcification is present within Patricia non-aneurysmal abdominal aorta, without hemodynamically significant stenosis. --No retroperitoneal lymphadenopathy. --No mesenteric lymphadenopathy. --No pelvic or inguinal lymphadenopathy. Reproductive: Status post hysterectomy. No adnexal mass. Other: There is a small amount of free fluid in Patricia patient's pelvis. Calcifications are noted in Patricia bilateral gluteal region and bilateral anterior proximal thighs. This may be secondary to prior injections at this location. These have slightly progressed since 2014. Musculoskeletal. There is an old healed fracture involving Patricia left inferior pubic ramus. There is no acute displaced fracture or dislocation on this study. There is a possible developing midline decubitus ulcer inferior to Patricia sacrum. There is evidence for pelvic floor laxity. IMPRESSION: 1. There is diffuse wall thickening of Patricia lower third of Patricia esophagus. Findings may be secondary to esophagitis, however a mass is not excluded. Follow-up with outpatient EGD is recommended. Patricia findings are concerning for esophageal perforation. 2. Free fluid and subcutaneous gas in Patricia upper mediastinum and right neck is likely related to Patricia patient's recent carotid endarterectomy. 3. Slight progression of scarring and bronchiectasis at Patricia lung bases bilaterally. Slight progression of subpleural reticulations bilaterally which may be secondary to chronic interstitial lung disease. 4. Slightly dilated pancreatic and common bile ducts,  progressed since 2017. Outpatient follow-up with MRCP or ERCP is recommended for further evaluation. Correlation with laboratory studies is recommended. 5. Nonobstructing left nephrolithiasis. 6. Small amount of free fluid in Patricia patient's pelvis. 7. Large stool burden. 8. Possible developing decubitus ulcer. Correlation with physical exam is recommended. 9. Pelvic floor laxity. Aortic Atherosclerosis (ICD10-I70.0). Electronically Signed   By: Katherine Mantle M.D.   On: 04/21/2019 20:24   CT ABDOMEN PELVIS W CONTRAST  Result Date: 04/21/2019 CLINICAL DATA:  Unintentional weight loss. EXAM: CT CHEST, ABDOMEN, AND PELVIS WITH CONTRAST TECHNIQUE: Multidetector CT imaging of Patricia chest, abdomen and pelvis was performed following Patricia standard protocol during bolus administration of intravenous contrast. CONTRAST:  OMNIPAQUE IOHEXOL 300 MG/ML  SOLN COMPARISON:  12/27/2015 CT of Patricia pelvis. FINDINGS: CT CHEST FINDINGS Cardiovascular: Patricia heart size is normal. There is no significant pericardial effusion. Coronary artery calcifications are noted. There is no large centrally located pulmonary embolism. Detection of smaller pulmonary emboli is limited by technique. There is no thoracic aortic aneurysm. Thoracic aortic calcifications are noted. Mediastinum/Nodes: --No mediastinal or hilar lymphadenopathy. --No axillary lymphadenopathy. --No supraclavicular lymphadenopathy. --Normal thyroid gland. --there is diffuse wall thickening of Patricia lower third of Patricia esophagus. There is pneumomediastinum in Patricia upper mediastinum and low right neck. There is fluid tracking within Patricia fat planes of Patricia upper mediastinum and right lower neck. There may be some overlying skin thickening involving Patricia low right neck. Patricia exact cause of these findings is not clearly identified. Lungs/Pleura: There is scarring and bronchiectasis at Patricia lung  bases bilaterally. This has slightly progressed since Patricia prior study. There is no  pneumothorax. No large pleural effusion. There is a small ground-glass airspace opacity in Patricia left lung apex measuring approximately 3 mm (axial series 5, image 30). This is felt to be post infectious in etiology. Reticulations bilaterally which have slightly progressed since Patricia prior study. There is questionable honeycombing involving Patricia right middle lobe and right lower lobe. Musculoskeletal: No chest wall abnormality. No acute or significant osseous findings. CT ABDOMEN PELVIS FINDINGS Hepatobiliary: Again identified is a probable portal venous malformation in Patricia left hepatic lobe, similar to prior study. Normal gallbladder.Patricia common bile duct is somewhat dilated. Pancreas: Patricia pancreatic duct is slightly dilated which has progressed since 2017. Spleen: No splenic laceration or hematoma. Adrenals/Urinary Tract: --Adrenal glands: No adrenal hemorrhage. --Right kidney/ureter: Patricia right kidney is slightly atrophic. --Left kidney/ureter: Patricia left kidney is slightly atrophic. There is a nonobstructing stone in Patricia upper pole. There is no hydronephrosis. --Urinary bladder: Unremarkable. Stomach/Bowel: --Stomach/Duodenum: No hiatal hernia or other gastric abnormality. Normal duodenal course and caliber. --Small bowel: No dilatation or inflammation. --Colon: Patricia stool burden is large. --Appendix: Not visualized. No right lower quadrant inflammation or free fluid. Vascular/Lymphatic: Atherosclerotic calcification is present within Patricia non-aneurysmal abdominal aorta, without hemodynamically significant stenosis. --No retroperitoneal lymphadenopathy. --No mesenteric lymphadenopathy. --No pelvic or inguinal lymphadenopathy. Reproductive: Status post hysterectomy. No adnexal mass. Other: There is a small amount of free fluid in Patricia patient's pelvis. Calcifications are noted in Patricia bilateral gluteal region and bilateral anterior proximal thighs. This may be secondary to prior injections at this location. These have  slightly progressed since 2014. Musculoskeletal. There is an old healed fracture involving Patricia left inferior pubic ramus. There is no acute displaced fracture or dislocation on this study. There is a possible developing midline decubitus ulcer inferior to Patricia sacrum. There is evidence for pelvic floor laxity. IMPRESSION: 1. There is diffuse wall thickening of Patricia lower third of Patricia esophagus. Findings may be secondary to esophagitis, however a mass is not excluded. Follow-up with outpatient EGD is recommended. Patricia findings are concerning for esophageal perforation. 2. Free fluid and subcutaneous gas in Patricia upper mediastinum and right neck is likely related to Patricia patient's recent carotid endarterectomy. 3. Slight progression of scarring and bronchiectasis at Patricia lung bases bilaterally. Slight progression of subpleural reticulations bilaterally which may be secondary to chronic interstitial lung disease. 4. Slightly dilated pancreatic and common bile ducts, progressed since 2017. Outpatient follow-up with MRCP or ERCP is recommended for further evaluation. Correlation with laboratory studies is recommended. 5. Nonobstructing left nephrolithiasis. 6. Small amount of free fluid in Patricia patient's pelvis. 7. Large stool burden. 8. Possible developing decubitus ulcer. Correlation with physical exam is recommended. 9. Pelvic floor laxity. Aortic Atherosclerosis (ICD10-I70.0). Electronically Signed   By: Katherine Mantle M.D.   On: 04/21/2019 20:24    PHYSICAL EXAM    Temp:  [96.8 F (36 C)-98.4 F (36.9 C)] 98.4 F (36.9 C) (12/18 0840) Pulse Rate:  [56-77] 73 (12/18 0840) Resp:  [10-20] 12 (12/18 0840) BP: (98-162)/(52-72) 153/62 (12/18 0840) SpO2:  [97 %-100 %] 100 % (12/18 0840) Arterial Line BP: (119-197)/(47-73) 133/52 (12/18 0205)  General - Well nourished, well developed, in no apparent distress.  Right neck incision clean and dry.  Ophthalmologic - fundi not visualized due to  noncooperation.  Cardiovascular - Regular rhythm and rate.  Mental Status -  Level of arousal and orientation to time, place, and person were intact.  Language including expression, naming, repetition, comprehension was assessed and found intact. Fund of Knowledge was assessed and was intact.  Cranial Nerves II - XII - II - Visual field intact OU. III, IV, VI - Extraocular movements intact. V - Facial sensation intact bilaterally. VII - Facial movement intact bilaterally. VIII - Hearing & vestibular intact bilaterally. X - Palate elevates symmetrically. XI - Chin turning & shoulder shrug intact bilaterally. XII - Tongue protrusion intact.  Motor Stanley - Patricia Stanley was normal in RUE and RLE however, LUE3/5 and LLE 4/5.  Bulk was normal and fasciculations were absent.   Motor Tone - Muscle tone was assessed at Patricia neck and appendages and was normal.  Reflexes - Patricia patient's reflexes were symmetrical in all extremities and Patricia Stanley had no pathological reflexes.  Sensory - Light touch, temperature/pinprick were assessed and were symmetrical.    Coordination - Patricia patient had normal movements in Patricia right hand with no ataxia or dysmetria.  Tremor was absent.  Gait and Station - deferred.   ASSESSMENT/PLAN Patricia Stanley is a 73 y.o. female with history of migraines, hypertension, depression, gastroesophageal reflux and ongoing weight loss with over more than a year of unexplainable weight loss for which Patricia Stanley has received a GI work-up, presenting with 2 to 3 days worth of left-sided weakness and difficulty with speech.   Stroke:  right CR infarct secondary to small vessel disease in setting of large vessel disease source  CT head Small vessel disease. Atrophy. Old lacunes. No acute abnormality.   MRI  R corona radiata infarct. Multiple old lacunes L frontal lobe, B basal ganglia, B thalami. Small vessel disease.   CTA head & neck no LVO. Extensive plaque R ICA w/  65-70% stenosis. Multifocal moderate L CCA/origin 50% stenosis. extenvie atherosclerosis w/ severe stenosis supraclinoid L ICA, moderate proximal R M1 and L M1, moderate to severe multifocal L V4 stenoses. B PCAs extensive atherosclerosis. 79mm outpouching R PCA ? Small aneurysm.  Carotid Doppler right ICA 40-59% stenosis  2D Echo EF 60-65%  LDL 87  HgbA1c 5.8  Lovenox 30 mg sq daily for VTE prophylaxis  No antithrombotic prior to admission, now on aspirin 325 mg daily and clopidogrel 75 mg daily. Continue DAPT for 3 months and then ASA alone.  Therapy recommendations:  CIR vs HH  Disposition:  pending   Symptomatic right ICA stenosis  CTA head & neck Extensive plaque R ICA w/ 65-70% stenosis.   Carotid Doppler right ICA 40-59% stenosis  R CEA by [facility] Dr Carlis Abbott 12/17  Hypotension  Hx of hypertension  BP improved  Clonidine discontinued  On atenolol . Continue NS IVF @ 75cc/h  . BP goal < 160 post CEA . Long-term BP goal normotensive  Hyperlipidemia  Home meds:  zocor 40  Now on lipitor 80  LDL 87, goal < 70  Continue statin at discharge  Depression   As per husband, Patricia depression has been going on for years  Patricia Stanley followed with psychiatry for a while and then lost follow up  Pt had no motivation at home. 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 Slightly dilated pancreatic and CBD.   GI consult requested  Other Stroke Risk Factors  Advanced  age  Former cigarette smoker  Migraines not on topamax PTA as per pt   Other Active Problems  [facility]  H[facility] day # 4  Neurology will sign off. Please call with questions. Pt [facility] stroke [facility]. Thanks for Patricia consult.   Marvel PlanJindong Loza Prell, MD PhD Stroke Neurology 04/22/2019 11:03 AM  To contact Stroke Continuity provider, please refer to [email or web address]. After hours, contact General Neurology

## 2019-04-22 NOTE — Progress Notes (Signed)
Inpatient Rehabilitation Admissions Coordinator  I met with patient and her daughter at bedside with spouse via phone and Dr. Erlinda Hong.. I do not have a bed available for this patient over the weekend. They wish me to pursue insurance approval for a possible inpt rehab bed in case bed becomes available. We explained that once medical work up is complete, we can not wait for bed available or insurance approval to remain in house. If not able to go to CIR, spouse wishes to take patient home but will need hospital bed and other DME and home health. They do not want SNF.  Danne Baxter, RN, MSN Rehab Admissions Coordinator [phone] 04/22/2019 12:05 PM

## 2019-04-22 NOTE — Progress Notes (Signed)
Inpatient Rehabilitation Admissions Coordinator  I will meet with patient and her spouse today to clarify her preference for d/c home with [facility] vs pursuing CIR admit. I have not begun insurance approval for pt has refused to date ,nor do I have a bed available this week for this patient.  Danne Baxter, RN, MSN Rehab Admissions Coordinator [phone] 04/22/2019 8:33 AM

## 2019-04-22 NOTE — Consult Note (Signed)
Referring Provider:   Dr. Fran Lowes Primary Care Physician:  Marden Noble, MD Primary Gastroenterologist:  Dr. Danise Edge (now retired)  Reason for Consultation: Pneumomediastinum, esophageal thickening  HPI: Patricia Stanley is a 73 y.o. female admitted to the hospital several days ago with a TIA and who underwent an emergent carotid endarterectomy.  Yesterday evening, she underwent a CT scan because of a history of significant weight loss.  It did not show any definite evidence of malignancy, but did show pneumomediastinum and fluid in the upper mediastinum and lower right neck, consistent with postsurgical changes, but in addition, did show distal esophageal thickening raising the question of esophagitis or even possibly a mass.  Given this constellation of findings, we were asked to see the patient.  It turns out the patient has several GI issues.  Although she does not have any dysphagia, she has had significant, progressive weight loss over the past couple of years, the exact amount of which is unclear.  The patient also has a history of significant heartburn and regurgitation, and apparently was not on PPI therapy prior to admission, although here in the hospital she is on pantoprazole 40 mg orally once daily.  The patient had endoscopy and colonoscopy by Dr. Laural Benes 3-1/2 years ago.  At that time, the endoscopy was normal, specifically without evidence of any esophagitis and with normal duodenal biopsies.  The colonoscopy was also normal including random mucosal biopsies, showing the absence of microscopic colitis.  She has a longstanding history of postprandial diarrhea and in fact apparently Dr. Laural Benes encouraged her to come in for further evaluation several years ago but she declined to do so.  At present, she denies any issues with chest pain or trouble breathing, and she is afebrile, so there are no clinical manifestations of an esophageal perforation.  Also of relevance, the  patient was found on her CT scan to have a somewhat dilated bile duct without any evident pancreatic mass; in this regard, it is noteworthy that the patient has completely normal liver chemistries.   Past Medical History:  Diagnosis Date  . Closed fracture of coccyx (HCC) 2015   AFTER FALL  . Depression   . GERD (gastroesophageal reflux disease)   . Headache    MIGRAINES OCCASIONAL  . History of kidney stones    HAS NOW  . Hypertension     Past Surgical History:  Procedure Laterality Date  . ABDOMINAL HYSTERECTOMY     1 OVARY LEFT  . APPENDECTOMY  AGE 34   DONE WITH OVARY REMOVAL  . COLONOSCOPY WITH PROPOFOL N/A 12/03/2015   Procedure: COLONOSCOPY WITH PROPOFOL;  Surgeon: Charolett Bumpers, MD;  Location: [facility];  Service: Endoscopy;  Laterality: N/A;  . ESOPHAGOGASTRODUODENOSCOPY (EGD) WITH PROPOFOL N/A 12/03/2015   Procedure: ESOPHAGOGASTRODUODENOSCOPY (EGD) WITH PROPOFOL;  Surgeon: Charolett Bumpers, MD;  Location: [facility];  Service: Endoscopy;  Laterality: N/A;  . TONSILLECTOMY  AGE 81   AND ADENOIDS REMOVED    Prior to Admission medications   Medication Sig Start Date End Date Taking? Authorizing Provider  atenolol (TENORMIN) 50 MG tablet Take 50 mg by mouth every evening.    Yes [provider]  clonazePAM (KLONOPIN) 1 MG tablet Take 1 tablet (1 mg total) by mouth 2 (two) times daily. Patient taking differently: Take 1-2 mg by mouth See admin instructions. Take 1 tablet in the morning, and 2 tablets at bedtime 08/28/12  Yes Marden Noble, MD  cloNIDine (CATAPRES) 0.3 MG tablet Take  0.3 mg by mouth daily.   Yes [provider]  ondansetron (ZOFRAN) 4 MG tablet Take 1 tablet (4 mg total) by mouth every 6 (six) hours. Patient taking differently: Take 4 mg by mouth every 8 (eight) hours as needed for nausea or vomiting.  07/20/17  Yes Upstill, Melvenia Beam, PA-C  simvastatin (ZOCOR) 40 MG tablet Take 40 mg by mouth every evening. 09/27/15  Yes [provider]  topiramate (TOPAMAX) 100 MG tablet Take 150 mg by mouth at bedtime. For headaches   Yes [provider]    Current Facility-Administered Medications  Medication Dose Route Frequency Provider Last Rate Last Admin  . 0.9 %  sodium chloride infusion   Intravenous Continuous Milinda Antis, PA-C 75 mL/hr at 04/22/19 0600 Rate Verify at 04/22/19 0600  . 0.9 %  sodium chloride infusion  500 mL Intravenous Once PRN Setzer, Lynnell Jude, PA-C      . acetaminophen (TYLENOL) tablet 650 mg  650 mg Oral Q4H PRN Milinda Antis, PA-C   650 mg at 04/21/19 1530   Or  . acetaminophen (TYLENOL) 160 MG/5ML solution 650 mg  650 mg Per Tube Q4H PRN Setzer, Lynnell Jude, PA-C       Or  . acetaminophen (TYLENOL) suppository 650 mg  650 mg Rectal Q4H PRN Setzer, Lynnell Jude, PA-C      . alum & mag hydroxide-simeth (MAALOX/MYLANTA) 200-200-20 MG/5ML suspension 15-30 mL  15-30 mL Oral Q2H PRN Setzer, Lynnell Jude, PA-C      . aspirin suppository 300 mg  300 mg Rectal Daily Setzer, Sandra J, PA-C       Or  . aspirin tablet 325 mg  325 mg Oral Daily Milinda Antis, PA-C   325 mg at 04/22/19 8366  . atenolol (TENORMIN) tablet 50 mg  50 mg Oral QPM Milinda Antis, PA-C   50 mg at 04/21/19 1714  . atorvastatin (LIPITOR) tablet 80 mg  80 mg Oral q1800 Milinda Antis, PA-C   80 mg at 04/21/19 1714  . bisacodyl (DULCOLAX) suppository 10 mg  10 mg Rectal Once Swayze, Ava, DO      . clonazePAM (KLONOPIN) tablet 1 mg  1 mg Oral q morning - 10a Milinda Antis, PA-C   1 mg at 04/22/19 2947  . clonazePAM (KLONOPIN) tablet 2 mg  2 mg Oral QHS Milinda Antis, PA-C   2 mg at 04/21/19 2150  . clopidogrel (PLAVIX) tablet 75 mg  75 mg Oral Daily Milinda Antis, PA-C   75 mg at 04/22/19 0813  . docusate sodium (COLACE) capsule 100 mg  100 mg Oral Daily Milinda Antis, PA-C   100 mg at 04/22/19 0813  . docusate sodium (COLACE) capsule 200 mg  200 mg Oral Once Swayze, Ava, DO      . enoxaparin (LOVENOX)  injection 30 mg  30 mg Subcutaneous Q24H Milinda Antis, PA-C   30 mg at 04/22/19 0813  . guaiFENesin-dextromethorphan (ROBITUSSIN DM) 100-10 MG/5ML syrup 15 mL  15 mL Oral Q4H PRN Setzer, Lynnell Jude, PA-C      . hydrALAZINE (APRESOLINE) injection 5 mg  5 mg Intravenous Q4H PRN Milinda Antis, PA-C   5 mg at 04/21/19 1033  . HYDROcodone-acetaminophen (NORCO/VICODIN) 5-325 MG per tablet 1-2 tablet  1-2 tablet Oral Q4H PRN Cephus Shelling, MD   2 tablet at 04/22/19 [phone]  . HYDROmorphone (DILAUDID) injection 0.5 mg  0.5 mg Intravenous Q3H PRN Cephus Shelling,  MD      . labetalol (NORMODYNE) injection 10 mg  10 mg Intravenous Q10 min PRN Setzer, Lynnell Jude, PA-C      . magnesium sulfate IVPB 2 g 50 mL  2 g Intravenous Daily PRN Setzer, Lynnell Jude, PA-C      . metoprolol tartrate (LOPRESSOR) injection 2-5 mg  2-5 mg Intravenous Q2H PRN Setzer, Lynnell Jude, PA-C      . ondansetron (ZOFRAN) injection 4 mg  4 mg Intravenous Q6H PRN Milinda Antis, PA-C   4 mg at 04/21/19 1531  . pantoprazole (PROTONIX) EC tablet 40 mg  40 mg Oral Daily Milinda Antis, PA-C   40 mg at 04/22/19 1610  . phenol (CHLORASEPTIC) mouth spray 1 spray  1 spray Mouth/Throat PRN Milinda Antis, PA-C        Allergies as of 04/18/2019 - Review Complete 04/18/2019  Allergen Reaction Noted  . Paroxetine Other (See Comments) 11/20/2015    History reviewed. No pertinent family history.  Social History   Socioeconomic History  . Marital status: Married    Spouse name: Not on file  . Number of children: Not on file  . Years of education: Not on file  . Highest education level: Not on file  Occupational History  . Not on file  Tobacco Use  . Smoking status: Former Smoker    Quit date: 05/06/1995    Years since quitting: 23.9  . Smokeless tobacco: Never Used  Substance and Sexual Activity  . Alcohol use: No  . Drug use: No  . Sexual activity: Not on file  Other Topics Concern  . Not on file  Social History  Narrative  . Not on file   Social Determinants of Health   Financial Resource Strain:   . Difficulty of Paying Living Expenses: Not on file  Food Insecurity:   . Worried About Programme researcher, broadcasting/film/video in the Last Year: Not on file  . Ran Out of Food in the Last Year: Not on file  Transportation Needs:   . Lack of Transportation (Medical): Not on file  . Lack of Transportation (Non-Medical): Not on file  Physical Activity:   . Days of Exercise per Week: Not on file  . Minutes of Exercise per Session: Not on file  Stress:   . Feeling of Stress : Not on file  Social Connections:   . Frequency of Communication with Friends and Family: Not on file  . Frequency of Social Gatherings with Friends and Family: Not on file  . Attends Religious Services: Not on file  . Active Member of Clubs or Organizations: Not on file  . Attends Banker Meetings: Not on file  . Marital Status: Not on file  Intimate Partner Violence:   . Fear of Current or Ex-Partner: Not on file  . Emotionally Abused: Not on file  . Physically Abused: Not on file  . Sexually Abused: Not on file    Review of Systems: See HPI  Physical Exam: Vital signs in last 24 hours: Temp:  [96.8 F (36 C)-98.4 F (36.9 C)] 98.4 F (36.9 C) (12/18 0840) Pulse Rate:  [56-77] (P) 74 (12/18 1240) Resp:  [11-20] (P) 15 (12/18 1240) BP: (108-162)/(52-72) 153/62 (12/18 0840) SpO2:  [98 %-100 %] 100 % (12/18 0840) Arterial Line BP: (119-197)/(47-73) 133/52 (12/18 0205) Last BM Date: 04/21/19(per pt had BM this AM)  This is a rather frail female, appearing somewhat older than her age, sitting up in her  bedside chair in absolutely no distress, eating lunch when I came to see her.  She has an ecchymosis in her right neck with a bandage, consistent with her recent surgery.  She is awake and alert and without overt focal neurologic deficit.  She seems to be cognitively intact and her mood is unremarkable.  The chest is clear  except for a few inspiratory fine crackles at the right base.  No rhonchi, no pleural rub.  Heart normal, no pericardial rub.  Abdomen without any tenderness or overt mass-effect.  No obvious pallor or icterus.  Intake/Output from previous day: 12/17 0701 - 12/18 0700 In: 3423.9 [P.O.:600; I.V.:2723.9; IV Piggyback:100] Out: 1575 [Urine:1400; Blood:175] Intake/Output this shift: Total I/O In: -  Out: 400 [Urine:400]  Lab Results: Recent Labs    04/21/19 0407 04/22/19 0345  WBC 5.1 8.4  HGB 11.7* 10.5*  HCT 35.0* 31.7*  PLT 120* 113*   BMET Recent Labs    04/21/19 0407 04/22/19 0345  NA 144 142  K 3.0* 4.1  CL 115* 115*  CO2 20* 19*  GLUCOSE 94 95  BUN 11 11  CREATININE 0.99 0.99  CALCIUM 8.7* 8.7*   LFT No results for input(s): PROT, ALBUMIN, AST, ALT, ALKPHOS, BILITOT, BILIDIR, IBILI in the last 72 hours. PT/INR No results for input(s): LABPROT, INR in the last 72 hours.  Studies/Results: CT CHEST W CONTRAST  Result Date: 04/21/2019 CLINICAL DATA:  Unintentional weight loss. EXAM: CT CHEST, ABDOMEN, AND PELVIS WITH CONTRAST TECHNIQUE: Multidetector CT imaging of the chest, abdomen and pelvis was performed following the standard protocol during bolus administration of intravenous contrast. CONTRAST:  OMNIPAQUE IOHEXOL 300 MG/ML  SOLN COMPARISON:  12/27/2015 CT of the pelvis. FINDINGS: CT CHEST FINDINGS Cardiovascular: The heart size is normal. There is no significant pericardial effusion. Coronary artery calcifications are noted. There is no large centrally located pulmonary embolism. Detection of smaller pulmonary emboli is limited by technique. There is no thoracic aortic aneurysm. Thoracic aortic calcifications are noted. Mediastinum/Nodes: --No mediastinal or hilar lymphadenopathy. --No axillary lymphadenopathy. --No supraclavicular lymphadenopathy. --Normal thyroid gland. --there is diffuse wall thickening of the lower third of the esophagus. There is  pneumomediastinum in the upper mediastinum and low right neck. There is fluid tracking within the fat planes of the upper mediastinum and right lower neck. There may be some overlying skin thickening involving the low right neck. The exact cause of these findings is not clearly identified. Lungs/Pleura: There is scarring and bronchiectasis at the lung bases bilaterally. This has slightly progressed since the prior study. There is no pneumothorax. No large pleural effusion. There is a small ground-glass airspace opacity in the left lung apex measuring approximately 3 mm (axial series 5, image 30). This is felt to be post infectious in etiology. Reticulations bilaterally which have slightly progressed since the prior study. There is questionable honeycombing involving the right middle lobe and right lower lobe. Musculoskeletal: No chest wall abnormality. No acute or significant osseous findings. CT ABDOMEN PELVIS FINDINGS Hepatobiliary: Again identified is a probable portal venous malformation in the left hepatic lobe, similar to prior study. Normal gallbladder.The common bile duct is somewhat dilated. Pancreas: The pancreatic duct is slightly dilated which has progressed since 2017. Spleen: No splenic laceration or hematoma. Adrenals/Urinary Tract: --Adrenal glands: No adrenal hemorrhage. --Right kidney/ureter: The right kidney is slightly atrophic. --Left kidney/ureter: The left kidney is slightly atrophic. There is a nonobstructing stone in the upper pole. There is no hydronephrosis. --Urinary bladder: Unremarkable. Stomach/Bowel: --  Stomach/Duodenum: No hiatal hernia or other gastric abnormality. Normal duodenal course and caliber. --Small bowel: No dilatation or inflammation. --Colon: The stool burden is large. --Appendix: Not visualized. No right lower quadrant inflammation or free fluid. Vascular/Lymphatic: Atherosclerotic calcification is present within the non-aneurysmal abdominal aorta, without  hemodynamically significant stenosis. --No retroperitoneal lymphadenopathy. --No mesenteric lymphadenopathy. --No pelvic or inguinal lymphadenopathy. Reproductive: Status post hysterectomy. No adnexal mass. Other: There is a small amount of free fluid in the patient's pelvis. Calcifications are noted in the bilateral gluteal region and bilateral anterior proximal thighs. This may be secondary to prior injections at this location. These have slightly progressed since 2014. Musculoskeletal. There is an old healed fracture involving the left inferior pubic ramus. There is no acute displaced fracture or dislocation on this study. There is a possible developing midline decubitus ulcer inferior to the sacrum. There is evidence for pelvic floor laxity. IMPRESSION: 1. There is diffuse wall thickening of the lower third of the esophagus. Findings may be secondary to esophagitis, however a mass is not excluded. Follow-up with outpatient EGD is recommended. The findings are concerning for esophageal perforation. 2. Free fluid and subcutaneous gas in the upper mediastinum and right neck is likely related to the patient's recent carotid endarterectomy. 3. Slight progression of scarring and bronchiectasis at the lung bases bilaterally. Slight progression of subpleural reticulations bilaterally which may be secondary to chronic interstitial lung disease. 4. Slightly dilated pancreatic and common bile ducts, progressed since 2017. Outpatient follow-up with MRCP or ERCP is recommended for further evaluation. Correlation with laboratory studies is recommended. 5. Nonobstructing left nephrolithiasis. 6. Small amount of free fluid in the patient's pelvis. 7. Large stool burden. 8. Possible developing decubitus ulcer. Correlation with physical exam is recommended. 9. Pelvic floor laxity. Aortic Atherosclerosis (ICD10-I70.0). Electronically Signed   By: Katherine Mantlehristopher  Green M.D.   On: 04/21/2019 20:24   CT ABDOMEN PELVIS W  CONTRAST  Result Date: 04/21/2019 CLINICAL DATA:  Unintentional weight loss. EXAM: CT CHEST, ABDOMEN, AND PELVIS WITH CONTRAST TECHNIQUE: Multidetector CT imaging of the chest, abdomen and pelvis was performed following the standard protocol during bolus administration of intravenous contrast. CONTRAST:  100mL OMNIPAQUE IOHEXOL 300 MG/ML  SOLN COMPARISON:  12/27/2015 CT of the pelvis. FINDINGS: CT CHEST FINDINGS Cardiovascular: The heart size is normal. There is no significant pericardial effusion. Coronary artery calcifications are noted. There is no large centrally located pulmonary embolism. Detection of smaller pulmonary emboli is limited by technique. There is no thoracic aortic aneurysm. Thoracic aortic calcifications are noted. Mediastinum/Nodes: --No mediastinal or hilar lymphadenopathy. --No axillary lymphadenopathy. --No supraclavicular lymphadenopathy. --Normal thyroid gland. --there is diffuse wall thickening of the lower third of the esophagus. There is pneumomediastinum in the upper mediastinum and low right neck. There is fluid tracking within the fat planes of the upper mediastinum and right lower neck. There may be some overlying skin thickening involving the low right neck. The exact cause of these findings is not clearly identified. Lungs/Pleura: There is scarring and bronchiectasis at the lung bases bilaterally. This has slightly progressed since the prior study. There is no pneumothorax. No large pleural effusion. There is a small ground-glass airspace opacity in the left lung apex measuring approximately 3 mm (axial series 5, image 30). This is felt to be post infectious in etiology. Reticulations bilaterally which have slightly progressed since the prior study. There is questionable honeycombing involving the right middle lobe and right lower lobe. Musculoskeletal: No chest wall abnormality. No acute or significant osseous findings.  CT ABDOMEN PELVIS FINDINGS Hepatobiliary: Again identified  is a probable portal venous malformation in the left hepatic lobe, similar to prior study. Normal gallbladder.The common bile duct is somewhat dilated. Pancreas: The pancreatic duct is slightly dilated which has progressed since 2017. Spleen: No splenic laceration or hematoma. Adrenals/Urinary Tract: --Adrenal glands: No adrenal hemorrhage. --Right kidney/ureter: The right kidney is slightly atrophic. --Left kidney/ureter: The left kidney is slightly atrophic. There is a nonobstructing stone in the upper pole. There is no hydronephrosis. --Urinary bladder: Unremarkable. Stomach/Bowel: --Stomach/Duodenum: No hiatal hernia or other gastric abnormality. Normal duodenal course and caliber. --Small bowel: No dilatation or inflammation. --Colon: The stool burden is large. --Appendix: Not visualized. No right lower quadrant inflammation or free fluid. Vascular/Lymphatic: Atherosclerotic calcification is present within the non-aneurysmal abdominal aorta, without hemodynamically significant stenosis. --No retroperitoneal lymphadenopathy. --No mesenteric lymphadenopathy. --No pelvic or inguinal lymphadenopathy. Reproductive: Status post hysterectomy. No adnexal mass. Other: There is a small amount of free fluid in the patient's pelvis. Calcifications are noted in the bilateral gluteal region and bilateral anterior proximal thighs. This may be secondary to prior injections at this location. These have slightly progressed since 2014. Musculoskeletal. There is an old healed fracture involving the left inferior pubic ramus. There is no acute displaced fracture or dislocation on this study. There is a possible developing midline decubitus ulcer inferior to the sacrum. There is evidence for pelvic floor laxity. IMPRESSION: 1. There is diffuse wall thickening of the lower third of the esophagus. Findings may be secondary to esophagitis, however a mass is not excluded. Follow-up with outpatient EGD is recommended. The findings are  concerning for esophageal perforation. 2. Free fluid and subcutaneous gas in the upper mediastinum and right neck is likely related to the patient's recent carotid endarterectomy. 3. Slight progression of scarring and bronchiectasis at the lung bases bilaterally. Slight progression of subpleural reticulations bilaterally which may be secondary to chronic interstitial lung disease. 4. Slightly dilated pancreatic and common bile ducts, progressed since 2017. Outpatient follow-up with MRCP or ERCP is recommended for further evaluation. Correlation with laboratory studies is recommended. 5. Nonobstructing left nephrolithiasis. 6. Small amount of free fluid in the patient's pelvis. 7. Large stool burden. 8. Possible developing decubitus ulcer. Correlation with physical exam is recommended. 9. Pelvic floor laxity. Aortic Atherosclerosis (ICD10-I70.0). Electronically Signed   By: Constance Holster M.D.   On: 04/21/2019 20:24    Impression: 1.  Pneumomediastinum, probably postsurgical.  No clinical manifestations to since last esophageal perforation. 2.  Distal esophageal thickening, in the setting of reflux symptomatology, possible esophagitis (not present on endoscopy 3-1/2 years ago) 3.  Progressive weight loss, without evidence of malignancy radiographically  Plan: 1.  Gastrografin swallow today to confirm absence of esophageal perforation (doubt, on clinical grounds), ideally followed by barium to check for evidence of esophagitis or mass in the distal esophagus given the esophageal thickening seen on CT.  In the meantime, I will increase her pantoprazole to twice a day.  2.  Depending on radiographic findings, consider possible endoscopic evaluation for further elucidation of what is going on in her esophagus, although if we can get by without that, it would be better from the anesthesia risk perspective, given her recent neurologic event.  3.  Consider outpatient follow-up for weight loss and IBS type  diarrhea    LOS: 4 days   Youlanda Mighty Dmarco Baldus  04/22/2019, 1:02 PM   Pager [phone] If no answer or after 5 PM call [phone]

## 2019-04-22 NOTE — Care Management Important Message (Signed)
Important Message  Patient Details  Name: Patricia Stanley MRN: [id number] Date of Birth: Jun 05, 1945   Medicare Important Message Given:  Yes     Shelda Altes 04/22/2019, 12:19 PM

## 2019-04-23 MED ORDER — POLYETHYLENE GLYCOL 3350 17 G PO PACK
17.0000 g | PACK | Freq: Every day | ORAL | Status: DC | PRN
  Administered 2019-04-23 – 2019-04-24 (×2): 17 g via ORAL
  Filled 2019-04-23 (×2): qty 1

## 2019-04-23 NOTE — Progress Notes (Signed)
Physical Therapy Treatment Patient Details Name: Patricia Stanley MRN: [id number] DOB: 05-23-1945 Today's Date: 04/23/2019    History of Present Illness Patricia Stanley is a 73 y.o. female with medical history significant of hypertension, migraine headaches, depression, GERD being brought to the hospital via EMS for evaluation of left-sided weakness. In the ER Brain MRI showing a 1.4 cm acute infarct within the right corona radiata.  Per vascular note pt declined carotid endarectomy ; however, pt reporting that she has changed her mind (RN was notified).    PT Comments    Pt supine in bed on arrival.  She reports she has been sitting in chair and recently assisted back to bed.  PTA educated on need to assess functional mobility.  Pt had to urinate so assisted patient from bed to bed side commode.  Pt required cues for use of RW, hand placement and assistance to mobilize L side.  Pt continues to improve and remains an excellent candidate for aggressive therapies at CIR.  Pt able to ambulate with RW and mod-max assistance for 12 ft.     Follow Up Recommendations  CIR     Equipment Recommendations  Wheelchair (measurements PT);Wheelchair cushion (measurements PT);Other (comment);Rolling walker with 5" wheels    Recommendations for Other Services Rehab consult     Precautions / Restrictions Precautions Precautions: Fall Precaution Comments: L hemi Restrictions Weight Bearing Restrictions: No    Mobility  Bed Mobility Overal bed mobility: Needs Assistance Bed Mobility: Sidelying to Sit Rolling: Min guard Sidelying to sit: Min assist       General bed mobility comments: Pt required assistance to roll to R side and elevate trunk into sitting.  Pt with heavy reliance on railing to move to sitting.  Cues for LUE awareness but is moving LUE and LE better this session.  Transfers Overall transfer level: Needs assistance Equipment used: Rolling walker (2 wheeled)   Sit to Stand: Min  assist;Mod assist         General transfer comment: min assistance to come to sitting moderate assistance to lower to seated position due to poor eccentric load.  Pt required cues for R hand placement as she reaches to hold RW.  Ambulation/Gait Ambulation/Gait assistance: Mod assist;Max assist Gait Distance (Feet): 4 Feet(+ 12 ft) Assistive device: Rolling walker (2 wheeled) Gait Pattern/deviations: Step-to pattern;Shuffle;Trunk flexed;Ataxic;Narrow base of support     General Gait Details: Pt performed gt training with cues for sequencing.  Assistance to keep L hand on RW.  Pt with poor ability to turn and back to seated surface.   Stairs             Wheelchair Mobility    Modified Rankin (Stroke Patients Only) Modified Rankin (Stroke Patients Only) Pre-Morbid Rankin Score: Slight disability Modified Rankin: Moderately severe disability     Balance Overall balance assessment: Needs assistance Sitting-balance support: Feet supported Sitting balance-Leahy Scale: Fair Sitting balance - Comments: Pt can maintain balance for short period of time (<1 minute) but needs cues (keep R LE down, lean forward) Postural control: Right lateral lean;Posterior lean   Standing balance-Leahy Scale: Poor                              Cognition Arousal/Alertness: Awake/alert Behavior During Therapy: WFL for tasks assessed/performed Overall Cognitive Status: No family/caregiver present to determine baseline cognitive functioning Area of Impairment: Following commands;Safety/judgement;Problem solving  Following Commands: Follows one step commands with increased time Safety/Judgement: Decreased awareness of safety;Decreased awareness of deficits Awareness: Intellectual Problem Solving: Slow processing;Decreased initiation;Requires verbal cues        Exercises Other Exercises Other Exercises: Cervical rotation x10 reps focusing on end range  to teh R.    General Comments        Pertinent Vitals/Pain Pain Assessment: Faces Faces Pain Scale: Hurts even more Pain Location: neck,L wrist and L ankle Pain Descriptors / Indicators: Sore Pain Intervention(s): Monitored during session;Repositioned    Home Living                      Prior Function            PT Goals (current goals can now be found in the care plan section) Acute Rehab PT Goals Patient Stated Goal: wants to go home Potential to Achieve Goals: Fair Progress towards PT goals: Progressing toward goals    Frequency    Min 4X/week      PT Plan Current plan remains appropriate    Co-evaluation              AM-PAC PT "6 Clicks" Mobility   Outcome Measure  Help needed turning from your back to your side while in a flat bed without using bedrails?: A Little Help needed moving from lying on your back to sitting on the side of a flat bed without using bedrails?: A Little Help needed moving to and from a bed to a chair (including a wheelchair)?: A Lot Help needed standing up from a chair using your arms (e.g., wheelchair or bedside chair)?: A Lot Help needed to walk in hospital room?: A Lot Help needed climbing 3-5 steps with a railing? : Total 6 Click Score: 13    End of Session Equipment Utilized During Treatment: Gait belt Activity Tolerance: Patient tolerated treatment well Patient left: in chair;with call bell/phone within reach Nurse Communication: Mobility status PT Visit Diagnosis: Muscle weakness (generalized) (M62.81);Other abnormalities of gait and mobility (R26.89);Difficulty in walking, not elsewhere classified (R26.2);Hemiplegia and hemiparesis;Unsteadiness on feet (R26.81) Hemiplegia - Right/Left: Left Hemiplegia - dominant/non-dominant: Dominant Hemiplegia - caused by: Cerebral infarction     Time: 9629-5284 PT Time Calculation (min) (ACUTE ONLY): 21 min  Charges:  $Gait Training: 8-22 mins                      Bonney Leitz , PTA Acute Rehabilitation Services Pager [phone] Office [phone]     Roston Grunewald Artis Delay 04/23/2019, 3:35 PM

## 2019-04-23 NOTE — Progress Notes (Signed)
Subjective: Patient has been able to eat regular food without nausea, vomiting, abdominal pain, acid reflux, heartburn, difficulty swallowing or pain on swallowing.  She was started on pantoprazole twice a day yesterday evening.  She reports that she has not had a bowel movement for the last 24 hours.  Objective: Vital signs in last 24 hours: Temp:  [97.6 F (36.4 C)-98.6 F (37 C)] 97.6 F (36.4 C) (12/19 0846) Pulse Rate:  [66-74] 72 (12/19 0846) Resp:  [10-15] 10 (12/19 0846) BP: (136-176)/(64-73) 176/68 (12/19 0846) SpO2:  [98 %-100 %] 99 % (12/19 0846) Weight change:  Last BM Date: 04/21/19(per pt had BM this AM)  PE: Sitting up on bedside chair, thinly built, frail appearing GENERAL: Changes of recent surgery on right side of the neck, mild pallor, no icterus ABDOMEN: Soft, nondistended, nontender, normoactive bowel sounds EXTREMITIES: Weak left upper and left lower extremity  Lab Results: Results for orders placed or performed during the hospital encounter of 04/18/19 (from the past 48 hour(s))  CBC     Status: Abnormal   Collection Time: 04/22/19  3:45 AM  Result Value Ref Range   WBC 8.4 4.0 - 10.5 K/uL   RBC 3.38 (L) 3.87 - 5.11 MIL/uL   Hemoglobin 10.5 (L) 12.0 - 15.0 g/dL   HCT 16.1 (L) 09.6 - 04.5 %   MCV 93.8 80.0 - 100.0 fL   MCH 31.1 26.0 - 34.0 pg   MCHC 33.1 30.0 - 36.0 g/dL   RDW 40.9 81.1 - 91.4 %   Platelets 113 (L) 150 - 400 K/uL    Comment: REPEATED TO VERIFY PLATELET COUNT CONFIRMED BY SMEAR Immature Platelet Fraction may be clinically indicated, consider ordering this additional test [id number]    nRBC 0.0 0.0 - 0.2 %    Comment: Performed at [facility], [address], [place], [place] [address]  Basic metabolic panel     Status: Abnormal   Collection Time: 04/22/19  3:45 AM  Result Value Ref Range   Sodium 142 135 - 145 mmol/L   Potassium 4.1 3.5 - 5.1 mmol/L   Chloride 115 (H) 98 - 111 mmol/L   CO2 19 (L) 22 - 32 mmol/L   Glucose,  Bld 95 70 - 99 mg/dL   BUN 11 8 - 23 mg/dL   Creatinine, Ser 7.84 0.44 - 1.00 mg/dL   Calcium 8.7 (L) 8.9 - 10.3 mg/dL   GFR calc non Af Amer 57 (L) >60 mL/min   GFR calc Af Amer >60 >60 mL/min   Anion gap 8 5 - 15    Comment: Performed at [facility], [address] [place], [place] [address]    Studies/Results: CT CHEST W CONTRAST  Result Date: 04/21/2019 CLINICAL DATA:  Unintentional weight loss. EXAM: CT CHEST, ABDOMEN, AND PELVIS WITH CONTRAST TECHNIQUE: Multidetector CT imaging of the chest, abdomen and pelvis was performed following the standard protocol during bolus administration of intravenous contrast. CONTRAST:  OMNIPAQUE IOHEXOL 300 MG/ML  SOLN COMPARISON:  12/27/2015 CT of the pelvis. FINDINGS: CT CHEST FINDINGS Cardiovascular: The heart size is normal. There is no significant pericardial effusion. Coronary artery calcifications are noted. There is no large centrally located pulmonary embolism. Detection of smaller pulmonary emboli is limited by technique. There is no thoracic aortic aneurysm. Thoracic aortic calcifications are noted. Mediastinum/Nodes: --No mediastinal or hilar lymphadenopathy. --No axillary lymphadenopathy. --No supraclavicular lymphadenopathy. --Normal thyroid gland. --there is diffuse wall thickening of the lower third of the esophagus. There  is pneumomediastinum in the upper mediastinum and low right neck. There is fluid tracking within the fat planes of the upper mediastinum and right lower neck. There may be some overlying skin thickening involving the low right neck. The exact cause of these findings is not clearly identified. Lungs/Pleura: There is scarring and bronchiectasis at the lung bases bilaterally. This has slightly progressed since the prior study. There is no pneumothorax. No large pleural effusion. There is a small ground-glass airspace opacity in the left lung apex measuring approximately 3 mm (axial series 5, image 30). This is felt to  be post infectious in etiology. Reticulations bilaterally which have slightly progressed since the prior study. There is questionable honeycombing involving the right middle lobe and right lower lobe. Musculoskeletal: No chest wall abnormality. No acute or significant osseous findings. CT ABDOMEN PELVIS FINDINGS Hepatobiliary: Again identified is a probable portal venous malformation in the left hepatic lobe, similar to prior study. Normal gallbladder.The common bile duct is somewhat dilated. Pancreas: The pancreatic duct is slightly dilated which has progressed since 2017. Spleen: No splenic laceration or hematoma. Adrenals/Urinary Tract: --Adrenal glands: No adrenal hemorrhage. --Right kidney/ureter: The right kidney is slightly atrophic. --Left kidney/ureter: The left kidney is slightly atrophic. There is a nonobstructing stone in the upper pole. There is no hydronephrosis. --Urinary bladder: Unremarkable. Stomach/Bowel: --Stomach/Duodenum: No hiatal hernia or other gastric abnormality. Normal duodenal course and caliber. --Small bowel: No dilatation or inflammation. --Colon: The stool burden is large. --Appendix: Not visualized. No right lower quadrant inflammation or free fluid. Vascular/Lymphatic: Atherosclerotic calcification is present within the non-aneurysmal abdominal aorta, without hemodynamically significant stenosis. --No retroperitoneal lymphadenopathy. --No mesenteric lymphadenopathy. --No pelvic or inguinal lymphadenopathy. Reproductive: Status post hysterectomy. No adnexal mass. Other: There is a small amount of free fluid in the patient's pelvis. Calcifications are noted in the bilateral gluteal region and bilateral anterior proximal thighs. This may be secondary to prior injections at this location. These have slightly progressed since 2014. Musculoskeletal. There is an old healed fracture involving the left inferior pubic ramus. There is no acute displaced fracture or dislocation on this study.  There is a possible developing midline decubitus ulcer inferior to the sacrum. There is evidence for pelvic floor laxity. IMPRESSION: 1. There is diffuse wall thickening of the lower third of the esophagus. Findings may be secondary to esophagitis, however a mass is not excluded. Follow-up with outpatient EGD is recommended. The findings are concerning for esophageal perforation. 2. Free fluid and subcutaneous gas in the upper mediastinum and right neck is likely related to the patient's recent carotid endarterectomy. 3. Slight progression of scarring and bronchiectasis at the lung bases bilaterally. Slight progression of subpleural reticulations bilaterally which may be secondary to chronic interstitial lung disease. 4. Slightly dilated pancreatic and common bile ducts, progressed since 2017. Outpatient follow-up with MRCP or ERCP is recommended for further evaluation. Correlation with laboratory studies is recommended. 5. Nonobstructing left nephrolithiasis. 6. Small amount of free fluid in the patient's pelvis. 7. Large stool burden. 8. Possible developing decubitus ulcer. Correlation with physical exam is recommended. 9. Pelvic floor laxity. Aortic Atherosclerosis (ICD10-I70.0). Electronically Signed   By: Constance Holster M.D.   On: 04/21/2019 20:24   CT ABDOMEN PELVIS W CONTRAST  Result Date: 04/21/2019 CLINICAL DATA:  Unintentional weight loss. EXAM: CT CHEST, ABDOMEN, AND PELVIS WITH CONTRAST TECHNIQUE: Multidetector CT imaging of the chest, abdomen and pelvis was performed following the standard protocol during bolus administration of intravenous contrast. CONTRAST:  177mL OMNIPAQUE IOHEXOL  300 MG/ML  SOLN COMPARISON:  12/27/2015 CT of the pelvis. FINDINGS: CT CHEST FINDINGS Cardiovascular: The heart size is normal. There is no significant pericardial effusion. Coronary artery calcifications are noted. There is no large centrally located pulmonary embolism. Detection of smaller pulmonary emboli is  limited by technique. There is no thoracic aortic aneurysm. Thoracic aortic calcifications are noted. Mediastinum/Nodes: --No mediastinal or hilar lymphadenopathy. --No axillary lymphadenopathy. --No supraclavicular lymphadenopathy. --Normal thyroid gland. --there is diffuse wall thickening of the lower third of the esophagus. There is pneumomediastinum in the upper mediastinum and low right neck. There is fluid tracking within the fat planes of the upper mediastinum and right lower neck. There may be some overlying skin thickening involving the low right neck. The exact cause of these findings is not clearly identified. Lungs/Pleura: There is scarring and bronchiectasis at the lung bases bilaterally. This has slightly progressed since the prior study. There is no pneumothorax. No large pleural effusion. There is a small ground-glass airspace opacity in the left lung apex measuring approximately 3 mm (axial series 5, image 30). This is felt to be post infectious in etiology. Reticulations bilaterally which have slightly progressed since the prior study. There is questionable honeycombing involving the right middle lobe and right lower lobe. Musculoskeletal: No chest wall abnormality. No acute or significant osseous findings. CT ABDOMEN PELVIS FINDINGS Hepatobiliary: Again identified is a probable portal venous malformation in the left hepatic lobe, similar to prior study. Normal gallbladder.The common bile duct is somewhat dilated. Pancreas: The pancreatic duct is slightly dilated which has progressed since 2017. Spleen: No splenic laceration or hematoma. Adrenals/Urinary Tract: --Adrenal glands: No adrenal hemorrhage. --Right kidney/ureter: The right kidney is slightly atrophic. --Left kidney/ureter: The left kidney is slightly atrophic. There is a nonobstructing stone in the upper pole. There is no hydronephrosis. --Urinary bladder: Unremarkable. Stomach/Bowel: --Stomach/Duodenum: No hiatal hernia or other gastric  abnormality. Normal duodenal course and caliber. --Small bowel: No dilatation or inflammation. --Colon: The stool burden is large. --Appendix: Not visualized. No right lower quadrant inflammation or free fluid. Vascular/Lymphatic: Atherosclerotic calcification is present within the non-aneurysmal abdominal aorta, without hemodynamically significant stenosis. --No retroperitoneal lymphadenopathy. --No mesenteric lymphadenopathy. --No pelvic or inguinal lymphadenopathy. Reproductive: Status post hysterectomy. No adnexal mass. Other: There is a small amount of free fluid in the patient's pelvis. Calcifications are noted in the bilateral gluteal region and bilateral anterior proximal thighs. This may be secondary to prior injections at this location. These have slightly progressed since 2014. Musculoskeletal. There is an old healed fracture involving the left inferior pubic ramus. There is no acute displaced fracture or dislocation on this study. There is a possible developing midline decubitus ulcer inferior to the sacrum. There is evidence for pelvic floor laxity. IMPRESSION: 1. There is diffuse wall thickening of the lower third of the esophagus. Findings may be secondary to esophagitis, however a mass is not excluded. Follow-up with outpatient EGD is recommended. The findings are concerning for esophageal perforation. 2. Free fluid and subcutaneous gas in the upper mediastinum and right neck is likely related to the patient's recent carotid endarterectomy. 3. Slight progression of scarring and bronchiectasis at the lung bases bilaterally. Slight progression of subpleural reticulations bilaterally which may be secondary to chronic interstitial lung disease. 4. Slightly dilated pancreatic and common bile ducts, progressed since 2017. Outpatient follow-up with MRCP or ERCP is recommended for further evaluation. Correlation with laboratory studies is recommended. 5. Nonobstructing left nephrolithiasis. 6. Small amount  of free fluid in the patient's  pelvis. 7. Large stool burden. 8. Possible developing decubitus ulcer. Correlation with physical exam is recommended. 9. Pelvic floor laxity. Aortic Atherosclerosis (ICD10-I70.0). Electronically Signed   By: Katherine Mantlehristopher  Green M.D.   On: 04/21/2019 20:24   DG ESOPHAGUS W SINGLE CM (SOL OR THIN BA)  Result Date: 04/22/2019 CLINICAL DATA:  Pneumomediastinum.  Recent carotid end arterectomy. EXAM: ESOPHOGRAM/BARIUM SWALLOW TECHNIQUE: Single contrast examination was performed using  water-soluble. FLUOROSCOPY TIME:  Fluoroscopy Time:  0 minutes and 39 seconds. Radiation Exposure Index (if provided by the fluoroscopic device): Number of Acquired Spot Images: COMPARISON:  CT chest 04/21/2019 FINDINGS: Patient was placed in a shallow LPO position relative to the fluoro table with fluoro table and 30 degree head up position. She was given sips of water soluble contrast material and monitored fluoroscopically while swallowing. This reveals a tortuous esophagus without evidence for esophageal leak, diverticulum, or stricture. No esophageal mass lesion evident. Tertiary contractions are noted in the mid and distal esophagus without evidence for presbyesophagus. IMPRESSION: No evidence for esophageal leak. No esophageal mass lesion evident on this single contrast water-soluble study. Electronically Signed   By: Kennith CenterEric  Mansell M.D.   On: 04/22/2019 14:22    Medications: I have reviewed the patient's current medications.  Assessment: Changes of pneumomediastinum noted on CAT scan of the chest with diffuse wall lower third of the esophagus thickening and a follow-up EGD was recommended  Single contrast esophagogram from yesterday showed no evidence of esophageal leak, tortuous esophagus without evidence of diverticulum, stricture or mass noted  Patient noted to have slightly dilated pancreatic and common bile duct which is progressed since 2017, LFTs normal, T bili/AST/ALT/ALP of  0.09/23/11/59  Acute infarct of right coronary radiata with significant stenosis of right ICA, status post right carotid endarterectomy, on aspirin and Plavix  Plan: No plans for EGD-would avoid anesthesia unless needed, patient also on full dose of aspirin and Plavix, esophagogram from yesterday unremarkable. Continue PPI twice daily for now.  Recommend outpatient work-up for dilated CBD and pancreatic duct with normal LFTs.  Patient has longstanding history of IBS type diarrhea and work-up for weight loss can be done as an outpatient.  We will sign off, please recall as needed.   Kerin SalenArya Kandice Schmelter, MD 04/23/2019, 11:25 AM

## 2019-04-23 NOTE — Progress Notes (Signed)
PROGRESS NOTE  Patricia Stanley DJM:[id number] DOB: 14-Mar-1946 DOA: 04/18/2019 PCP: Josetta Huddle, MD  Brief History   Patricia Masterson Gregoryis a 73 y.o.femalewith medical history significant ofhypertension, migraine headaches, depression, GERD being brought to the hospital via EMS for evaluation of left-sided weakness. In the ER Brain MRI showing a 1.4 cm acute infarct within the right corona radiata and she was admitted for further stroke workup. Started on daily ASA, TTE pending and CTA head and neck shows extensive atherosclerotic changes as well as significant stenosis in the right ICA.   Vascular surgery has been consulted. The patient underwent CEA for Right Carotid stenosis with Dr. Carlis Abbott on 04/21/2019.   CTA of the chest abdomen and pelvis was obtained on 04/21/2019. It demonstrated thickening of the esophageal mucosa, and dilatation of the bile ducts. There was also possibly a pneumomediastinum, likely due to CEA yesterday.  The patient has been evaluated by gastroenterology. Esophagram was ordered there is no evidence for esophageal leak. There is no esophageal mass lesion evident. GI has signed off. Recommendation is for outpatient work up for dilated common bile duct and pancreatic duct with normal LFT's. Outpatient work up is also recommended for IBS-type diarrhea and work up for weight loss.  Consultants  . Neurology . PM & R . Vascular Surgery . Gastroenterology  Procedures  . CEA  Antibiotics   Anti-infectives (From admission, onward)   Start     Dose/Rate Route Frequency Ordered Stop   04/21/19 1600  ceFAZolin (ANCEF) IVPB 2g/100 mL premix     2 g 200 mL/hr over 30 Minutes Intravenous  Once 04/21/19 1340 04/21/19 1608     Subjective  The patient is resting comfortably. No new complaints.  Objective   Vitals:  Vitals:   04/23/19 0846 04/23/19 1145  BP: (!) 176/68 136/69  Pulse: 72 71  Resp: 10 16  Temp: 97.6 F (36.4 C) 98.4 F (36.9 C)  SpO2: 99% 97%    Exam:  Constitutional:  . The patient is awake, alert, and oriented x 3. Mild distress from nausea. Respiratory:  . No increased work of breathing. . No wheezes, rales, or rhonchi . No tactile fremitus Cardiovascular:  . Regular rate and rhythm . No murmurs, ectopy, or gallups. . No lateral PMI. No thrills. Abdomen:  . Abdomen is soft, non-tender, non-distended . No hernias, masses, or organomegaly . Hypoactive bowel sounds.  Musculoskeletal:  . No cyanosis, clubbing, or edema Skin:  . No rashes, lesions, ulcers . palpation of skin: no induration or nodules Neurologic:  . Left sided facial droop and weakness. [name]  Psychiatric:  . Mental status o Mood, affect appropriate o Orientation to person, place, time  . judgment and insight appear intact  I have personally reviewed the following:   Today's Data  . Vitals, BMP, CBC  Imaging  . Carotid dopplers  Scheduled Meds: . aspirin  300 mg Rectal Daily   Or  . aspirin  325 mg Oral Daily  . atenolol  50 mg Oral QPM  . atorvastatin  80 mg Oral q1800  . bisacodyl  10 mg Rectal Once  . clonazePAM  1 mg Oral q morning - 10a  . clonazePAM  2 mg Oral QHS  . clopidogrel  75 mg Oral Daily  . docusate sodium  100 mg Oral Daily  . enoxaparin (LOVENOX) injection  30 mg Subcutaneous Q24H  . pantoprazole  40 mg Oral BID AC   Continuous Infusions: . sodium chloride 75 mL/hr at 04/22/19  1834  . sodium chloride    . magnesium sulfate bolus IVPB      Principal Problem:   Acute CVA (cerebrovascular accident) ([place]) Active Problems:   Migraines   HTN (hypertension)   Weight loss, unintentional   Anxiety   LOS: 5 days   A & P   Acute ischemic stroke:  Patient presented with a3-day history of left-sided weakness.Brain MRI showing a 1.4 cm acute infarct within the right corona radiata. Neurology was consulted. CTA head and neck demonstrated significant stenosis of the Rt ICA. Vascular consult was called. Dr. Carlis Abbott will  take the patient for CEA tomorrow am. Echocardiogram demonstrates LVEF of 60-65%. No LVH with Grade II Diastolic dysfunction. There are no wall motion abnormalities. No valvular abnormalities. No intracardiac sources of emboli. The patient is receiving ASA 325 mg daily, atorvastatin 80 mg daily. Blood pressure medications have been restarted.  Carotid stenosis: Right ICA stenosis. Patient has undergone CEA of right carotid today. She has tolerated it well, despite some post-operative nausea and vomiting. Vascular surgery's assistance is greatly appreciated.  Esophageal thickening/Dilated bile ducts: Seen on CT of the chest abdomen and pelvis performed on 04/21/2019 to investigate possible oncological cause of her unintentional weight loss. Gastroenterology has been consulted.   Uncontrolled hypertension: Per husband, patient has not had regular follow-up with her PCP.Blood pressure is elevated with systolic up to 314H.Blood pressures today are a little higher than desirable. Will add low dose lisinopril.  Unintentional weight loss: Per husband, patient has had loss of appetite, intermittent diarrhea, and significant unintentional weight loss over the past 1 year and has not had any recent outpatient work-up done. Per chart, 16 kg weight loss since 07/2017.She is a former smoker.Per chart, patient was last seen by GI in July 2017 for evaluation of chronic watery diarrhea and unintentional weight loss.Colonoscopy and EGD were done at that time. Colonoscopy revealed normal colon and biopsiesshowing benign colonic mucosa. EGD showing normal stomach, esophagus, and duodenum. Small intestinal biopsy showing benign small bowel mucosa and stomach biopsy showing essentially unremarkable gastric type mucosa. CXR performed on 04/19/2019 demonstrated hype-rexapansion and chronic bronchitic changes without superimposed acute cardiopulmonary disease and aortic atherosclerosis. TSH  - nl. ESR - nl. CRP - nl.  HIV - nr. FOBT pending. Gastroenterology was consulted.  Esophagram was ordered there is no evidence for esophageal leak. There is no esophageal mass lesion evident. GI has signed off. Recommendation is for outpatient work up for dilated common bile duct and pancreatic duct with normal LFT's. Outpatient work up is also recommended for IBS-type diarrhea and work up for weight loss.   Anxiety: Continue home Klonopin.  History of migraine headaches: Continue home Topamax.  I have seen and examined this patient myself. I have spent 32 minutes in her evaluation and care.  DVT prophylaxis:Lovenox Code Status:Partial code (NOintubation/mechanical ventilation) Family Communication:None available Disposition Plan:CIR  Ashiah Karpowicz, DO [facility] Direct contact: see [email or web address]  7PM-7AM contact night coverage as above 04/23/2019, 2:17 PM  LOS: 2 days

## 2019-04-24 ENCOUNTER — Encounter: Payer: Self-pay | Admitting: *Deleted

## 2019-04-24 MED ORDER — LISINOPRIL 10 MG PO TABS
10.0000 mg | ORAL_TABLET | Freq: Every day | ORAL | Status: DC
  Administered 2019-04-24 – 2019-04-25 (×2): 10 mg via ORAL
  Filled 2019-04-24 (×2): qty 1

## 2019-04-24 MED ORDER — POTASSIUM CHLORIDE 10 MEQ/100ML IV SOLN: 10.0000 meq | INTRAVENOUS | Status: DC

## 2019-04-24 MED ORDER — HYDRALAZINE HCL 25 MG PO TABS
25.0000 mg | ORAL_TABLET | Freq: Four times a day (QID) | ORAL | Status: DC
  Administered 2019-04-24 – 2019-04-25 (×6): 25 mg via ORAL
  Filled 2019-04-24 (×5): qty 1

## 2019-04-24 MED ORDER — HYDROCORTISONE 1 % EX CREA
TOPICAL_CREAM | Freq: Three times a day (TID) | CUTANEOUS | Status: DC | PRN
  Administered 2019-04-24: 1 via TOPICAL
  Filled 2019-04-24: qty 28

## 2019-04-24 NOTE — Progress Notes (Signed)
Physical Therapy Treatment Patient Details Name: Patricia Stanley MRN: [id number] DOB: 09-09-1945 Today's Date: 04/24/2019    History of Present Illness Patricia Stanley is a 73 y.o. female with medical history significant of hypertension, migraine headaches, depression, GERD being brought to the hospital via EMS for evaluation of left-sided weakness. In the ER Brain MRI showing a 1.4 cm acute infarct within the right corona radiata.  Per vascular note pt declined carotid endarectomy ; however, pt reporting that she has changed her mind (RN was notified).    PT Comments    Pt tolerated treatment well demonstrating improved tolerance for OOB activity and transfer quality. Pt with improved LLE strength leading to reduced assistance requirements during mobility. Pt continues to demonstrate balance and gait deviations which significantly increase the pt's falls risk. Pt will benefit from continued acute PT services to improve L side strength, balance, and gait quality, in order to also reduce falls risk.   Follow Up Recommendations  CIR     Equipment Recommendations  Wheelchair (measurements PT);Wheelchair cushion (measurements PT);Other (comment);Rolling walker with 5" wheels    Recommendations for Other Services       Precautions / Restrictions Precautions Precautions: Fall Precaution Comments: L hemi Restrictions Weight Bearing Restrictions: No    Mobility  Bed Mobility Overal bed mobility: Needs Assistance Bed Mobility: Supine to Sit;Sit to Supine     Supine to sit: Min assist Sit to supine: Min assist      Transfers Overall transfer level: Needs assistance Equipment used: Rolling walker (2 wheeled) Transfers: Sit to/from [facility] Sit to Stand: Min assist Stand pivot transfers: Mod assist       General transfer comment: SPT without RW  Ambulation/Gait Ambulation/Gait assistance: Mod assist Gait Distance (Feet): 8 Feet(3 trials of 8') Assistive  device: Rolling walker (2 wheeled) Gait Pattern/deviations: Step-to pattern;Scissoring Gait velocity: reduced Gait velocity interpretation: <1.31 ft/sec, indicative of household ambulator General Gait Details: pt with short step to gait with inconsistent placement of L foot, intermittent scissoring. PT providing verbal and visual cues to increase width of base of support   Stairs             Wheelchair Mobility    Modified Rankin (Stroke Patients Only) Modified Rankin (Stroke Patients Only) Pre-Morbid Rankin Score: Slight disability Modified Rankin: Moderately severe disability     Balance Overall balance assessment: Needs assistance Sitting-balance support: Single extremity supported;Feet supported Sitting balance-Leahy Scale: Good Sitting balance - Comments: supervision   Standing balance support: Bilateral upper extremity supported Standing balance-Leahy Scale: Poor Standing balance comment: modA with posterior lean at times                            Cognition Arousal/Alertness: Awake/alert Behavior During Therapy: WFL for tasks assessed/performed Overall Cognitive Status: Impaired/Different from baseline Area of Impairment: Memory                     Memory: Decreased short-term memory                Exercises Other Exercises Other Exercises: L shoulder flexion self-AAROM    General Comments General comments (skin integrity, edema, etc.): VSS      Pertinent Vitals/Pain Pain Assessment: Faces Faces Pain Scale: Hurts little more Pain Location: neck,L wrist and L ankle Pain Descriptors / Indicators: Sore Pain Intervention(s): Limited activity within patient's tolerance    Home Living  Prior Function            PT Goals (current goals can now be found in the care plan section) Acute Rehab PT Goals Patient Stated Goal: Pt more agreeable to rehab, wants to return to baseline Progress towards PT  goals: Progressing toward goals    Frequency    Min 4X/week      PT Plan Current plan remains appropriate    Co-evaluation              AM-PAC PT "6 Clicks" Mobility   Outcome Measure  Help needed turning from your back to your side while in a flat bed without using bedrails?: A Little Help needed moving from lying on your back to sitting on the side of a flat bed without using bedrails?: A Little Help needed moving to and from a bed to a chair (including a wheelchair)?: A Little Help needed standing up from a chair using your arms (e.g., wheelchair or bedside chair)?: A Little Help needed to walk in hospital room?: A Lot Help needed climbing 3-5 steps with a railing? : Total 6 Click Score: 15    End of Session Equipment Utilized During Treatment: Gait belt Activity Tolerance: Patient tolerated treatment well Patient left: in bed;with call bell/phone within reach;with bed alarm set;with family/visitor present Nurse Communication: Mobility status PT Visit Diagnosis: Muscle weakness (generalized) (M62.81);Other abnormalities of gait and mobility (R26.89);Difficulty in walking, not elsewhere classified (R26.2);Hemiplegia and hemiparesis;Unsteadiness on feet (R26.81) Hemiplegia - Right/Left: Left Hemiplegia - dominant/non-dominant: Dominant Hemiplegia - caused by: Cerebral infarction     Time: 1544-1610 PT Time Calculation (min) (ACUTE ONLY): 26 min  Charges:  $Gait Training: 8-22 mins $Therapeutic Activity: 8-22 mins                     Patricia Stanley, PT, DPT Acute Rehabilitation Pager: [phone]    Patricia Stanley 04/24/2019, 4:45 PM

## 2019-04-24 NOTE — Plan of Care (Signed)
Poc progressing.  

## 2019-04-24 NOTE — Progress Notes (Signed)
PROGRESS NOTE  Patricia Stanley ZGY:[id number] DOB: Nov 08, 1945 DOA: 04/18/2019 PCP: Josetta Huddle, MD  Brief History   Patricia Stanley a 73 y.o.femalewith medical history significant ofhypertension, migraine headaches, depression, GERD being brought to the hospital via EMS for evaluation of left-sided weakness. In the ER Brain MRI showing a 1.4 cm acute infarct within the right corona radiata and she was admitted for further stroke workup. Started on daily ASA, TTE pending and CTA head and neck shows extensive atherosclerotic changes as well as significant stenosis in the right ICA.   Vascular surgery has been consulted. The patient underwent CEA for Right Carotid stenosis with Dr. Carlis Abbott on 04/21/2019.   CTA of the chest abdomen and pelvis was obtained on 04/21/2019. It demonstrated thickening of the esophageal mucosa, and dilatation of the bile ducts. There was also possibly a pneumomediastinum, likely due to CEA yesterday.  The patient has been evaluated by gastroenterology. Esophagram was ordered there is no evidence for esophageal leak. There is no esophageal mass lesion evident. GI has signed off. Recommendation is for outpatient work up for dilated common bile duct and pancreatic duct with normal LFT's. Outpatient work up is also recommended for IBS-type diarrhea and work up for weight loss.   The patient is awaiting CIR bed.  Consultants  . Neurology . PM & R . Vascular Surgery . Gastroenterology  Procedures  . CEA  Antibiotics   Anti-infectives (From admission, onward)   Start     Dose/Rate Route Frequency Ordered Stop   04/21/19 1600  ceFAZolin (ANCEF) IVPB 2g/100 mL premix     2 g 200 mL/hr over 30 Minutes Intravenous  Once 04/21/19 1340 04/21/19 1608     Subjective  The patient is resting comfortably. No new complaints.  Objective   Vitals:  Vitals:   04/24/19 0734 04/24/19 1155  BP: (!) 159/74 (!) 154/64  Pulse: 83 77  Resp: 14 16  Temp: 98.4 F (36.9  C) 98.1 F (36.7 C)  SpO2: 95% 97%   Exam:  Constitutional:  . The patient is awake, alert, and oriented x 3. Mild distress from nausea. Respiratory:  . No increased work of breathing. . No wheezes, rales, or rhonchi . No tactile fremitus Cardiovascular:  . Regular rate and rhythm . No murmurs, ectopy, or gallups. . No lateral PMI. No thrills. Abdomen:  . Abdomen is soft, non-tender, non-distended . No hernias, masses, or organomegaly . Hypoactive bowel sounds.  Musculoskeletal:  . No cyanosis, clubbing, or edema Skin:  . No rashes, lesions, ulcers . palpation of skin: no induration or nodules Neurologic:  . Left sided facial droop and weakness. [name]  Psychiatric:  . Mental status o Mood, affect appropriate o Orientation to person, place, time  . judgment and insight appear intact  I have personally reviewed the following:   Today's Data  . Vitals  Imaging  . Carotid dopplers  Scheduled Meds: . aspirin  300 mg Rectal Daily   Or  . aspirin  325 mg Oral Daily  . atenolol  50 mg Oral QPM  . atorvastatin  80 mg Oral q1800  . bisacodyl  10 mg Rectal Once  . clonazePAM  1 mg Oral q morning - 10a  . clonazePAM  2 mg Oral QHS  . clopidogrel  75 mg Oral Daily  . docusate sodium  100 mg Oral Daily  . enoxaparin (LOVENOX) injection  30 mg Subcutaneous Q24H  . pantoprazole  40 mg Oral BID AC   Continuous Infusions: .  sodium chloride    . magnesium sulfate bolus IVPB      Principal Problem:   Acute CVA (cerebrovascular accident) ([place]) Active Problems:   Migraines   HTN (hypertension)   Weight loss, unintentional   Anxiety   LOS: 6 days   A & P   Acute ischemic stroke:  Patient presented with a3-day history of left-sided weakness.Brain MRI showing a 1.4 cm acute infarct within the right corona radiata. Neurology was consulted. CTA head and neck demonstrated significant stenosis of the Rt ICA. Vascular consult was called. Dr. Carlis Abbott will take the patient for  CEA tomorrow am. Echocardiogram demonstrates LVEF of 60-65%. No LVH with Grade II Diastolic dysfunction. There are no wall motion abnormalities. No valvular abnormalities. No intracardiac sources of emboli. The patient is receiving ASA 325 mg daily, atorvastatin 80 mg daily. Blood pressure medications have been restarted.  Carotid stenosis: Right ICA stenosis. Patient has undergone CEA of right carotid today. She has tolerated it well, despite some post-operative nausea and vomiting. Vascular surgery's assistance is greatly appreciated.  Esophageal thickening/Dilated bile ducts: Seen on CT of the chest abdomen and pelvis performed on 04/21/2019 to investigate possible oncological cause of her unintentional weight loss. Gastroenterology has been consulted. Esophagram did not demonstrate a mass. There was no perforation observed either. The patient is receiving bid pantoprazole. She will follow up with GI as outpatient for investigation of her IBS-D and possibly esophageal EGD once she is recovered from stroke.   Uncontrolled hypertension: Per husband, patient has not had regular follow-up with her PCP.Blood pressure is elevated with systolic up to 574V.Blood pressures today are a little higher than desirable. Will add scheduled hydralazine and low dose lisinopril.  Unintentional weight loss: Per husband, patient has had loss of appetite, intermittent diarrhea, and significant unintentional weight loss over the past 1 year and has not had any recent outpatient work-up done. Per chart, 16 kg weight loss since 07/2017.She is a former smoker.Per chart, patient was last seen by GI in July 2017 for evaluation of chronic watery diarrhea and unintentional weight loss.Colonoscopy and EGD were done at that time. Colonoscopy revealed normal colon and biopsiesshowing benign colonic mucosa. EGD showing normal stomach, esophagus, and duodenum. Small intestinal biopsy showing benign small bowel mucosa and  stomach biopsy showing essentially unremarkable gastric type mucosa. CXR performed on 04/19/2019 demonstrated hype-rexapansion and chronic bronchitic changes without superimposed acute cardiopulmonary disease and aortic atherosclerosis. TSH  - nl. ESR - nl. CRP - nl. HIV - nr. FOBT pending. Gastroenterology was consulted.  Esophagram was ordered there is no evidence for esophageal leak. There is no esophageal mass lesion evident. GI has signed off. Recommendation is for outpatient work up for dilated common bile duct and pancreatic duct with normal LFT's. Outpatient work up is also recommended for IBS-type diarrhea and work up for weight loss.   Anxiety: Continue home Klonopin.  History of migraine headaches: Continue home Topamax.  I have seen and examined this patient myself. I have spent 34 minutes in her evaluation and care.  DVT prophylaxis:Lovenox Code Status:Partial code (NOintubation/mechanical ventilation) Family Communication:None available Disposition Plan:CIR  Patricia Vargus, DO [facility] Direct contact: see [email or web address]  7PM-7AM contact night coverage as above 04/24/2019, 1:44 PM  LOS: 2 days

## 2019-04-25 ENCOUNTER — Encounter (HOSPITAL_COMMUNITY): Payer: Self-pay | Admitting: Physical Medicine & Rehabilitation

## 2019-04-25 ENCOUNTER — Inpatient Hospital Stay (HOSPITAL_COMMUNITY)
Admission: RE | Admit: 2019-04-25 | Discharge: 2019-05-10 | DRG: 057 | Disposition: A | Discharge status: Home Health | Payer: Medicare HMO | Source: Intra-hospital | Attending: Physical Medicine & Rehabilitation | Admitting: Physical Medicine & Rehabilitation

## 2019-04-25 ENCOUNTER — Other Ambulatory Visit: Payer: Self-pay

## 2019-04-25 DIAGNOSIS — F331 Major depressive disorder, recurrent, moderate: Secondary | ICD-10-CM

## 2019-04-25 DIAGNOSIS — Z87891 Personal history of nicotine dependence: Secondary | ICD-10-CM | POA: Diagnosis not present

## 2019-04-25 DIAGNOSIS — I69392 Facial weakness following cerebral infarction: Secondary | ICD-10-CM

## 2019-04-25 DIAGNOSIS — K5900 Constipation, unspecified: Secondary | ICD-10-CM | POA: Diagnosis not present

## 2019-04-25 DIAGNOSIS — I639 Cerebral infarction, unspecified: Secondary | ICD-10-CM | POA: Diagnosis present

## 2019-04-25 DIAGNOSIS — I6521 Occlusion and stenosis of right carotid artery: Secondary | ICD-10-CM | POA: Diagnosis present

## 2019-04-25 DIAGNOSIS — G4700 Insomnia, unspecified: Secondary | ICD-10-CM | POA: Diagnosis not present

## 2019-04-25 DIAGNOSIS — I69320 Aphasia following cerebral infarction: Secondary | ICD-10-CM | POA: Diagnosis not present

## 2019-04-25 DIAGNOSIS — Z7902 Long term (current) use of antithrombotics/antiplatelets: Secondary | ICD-10-CM | POA: Diagnosis not present

## 2019-04-25 DIAGNOSIS — Z23 Encounter for immunization: Secondary | ICD-10-CM | POA: Diagnosis not present

## 2019-04-25 DIAGNOSIS — R634 Abnormal weight loss: Secondary | ICD-10-CM | POA: Diagnosis present

## 2019-04-25 DIAGNOSIS — Z888 Allergy status to other drugs, medicaments and biological substances status: Secondary | ICD-10-CM | POA: Diagnosis not present

## 2019-04-25 DIAGNOSIS — G43909 Migraine, unspecified, not intractable, without status migrainosus: Secondary | ICD-10-CM | POA: Diagnosis present

## 2019-04-25 DIAGNOSIS — I10 Essential (primary) hypertension: Secondary | ICD-10-CM | POA: Diagnosis not present

## 2019-04-25 DIAGNOSIS — Z7982 Long term (current) use of aspirin: Secondary | ICD-10-CM

## 2019-04-25 DIAGNOSIS — E785 Hyperlipidemia, unspecified: Secondary | ICD-10-CM | POA: Diagnosis present

## 2019-04-25 DIAGNOSIS — K219 Gastro-esophageal reflux disease without esophagitis: Secondary | ICD-10-CM | POA: Diagnosis present

## 2019-04-25 DIAGNOSIS — I69354 Hemiplegia and hemiparesis following cerebral infarction affecting left non-dominant side: Secondary | ICD-10-CM | POA: Diagnosis not present

## 2019-04-25 DIAGNOSIS — D62 Acute posthemorrhagic anemia: Secondary | ICD-10-CM | POA: Diagnosis not present

## 2019-04-25 MED ORDER — HYDROCODONE-ACETAMINOPHEN 5-325 MG PO TABS: 1.0000 | ORAL_TABLET | ORAL | 0 refills | Status: DC | PRN

## 2019-04-25 MED ORDER — ASPIRIN 325 MG PO TABS: 325.0000 mg | ORAL_TABLET | Freq: Every day | ORAL | 0 refills | Status: AC

## 2019-04-25 MED ORDER — LISINOPRIL 10 MG PO TABS: 10.0000 mg | ORAL_TABLET | Freq: Every day | ORAL | 0 refills | Status: DC

## 2019-04-25 MED ORDER — CLONAZEPAM 1 MG PO TABS: 1.0000 mg | ORAL_TABLET | Freq: Every morning | ORAL | 0 refills | Status: DC

## 2019-04-25 MED ORDER — LISINOPRIL 10 MG PO TABS
10.0000 mg | ORAL_TABLET | Freq: Every day | ORAL | Status: DC
  Administered 2019-04-26 – 2019-05-10 (×15): 10 mg via ORAL
  Filled 2019-04-25 (×15): qty 1

## 2019-04-25 MED ORDER — ACETAMINOPHEN 650 MG RE SUPP: 650.0000 mg | RECTAL | Status: DC | PRN

## 2019-04-25 MED ORDER — ATENOLOL 50 MG PO TABS
50.0000 mg | ORAL_TABLET | Freq: Every evening | ORAL | Status: DC
  Administered 2019-04-26 – 2019-05-09 (×14): 50 mg via ORAL
  Filled 2019-04-25 (×14): qty 1

## 2019-04-25 MED ORDER — POLYETHYLENE GLYCOL 3350 17 G PO PACK: 17.0000 g | PACK | Freq: Every day | ORAL | Status: DC | PRN

## 2019-04-25 MED ORDER — ATORVASTATIN CALCIUM 80 MG PO TABS
80.0000 mg | ORAL_TABLET | Freq: Every day | ORAL | Status: DC
  Administered 2019-04-26 – 2019-05-09 (×14): 80 mg via ORAL
  Filled 2019-04-25 (×14): qty 1

## 2019-04-25 MED ORDER — CLONAZEPAM 0.5 MG PO TABS
1.0000 mg | ORAL_TABLET | Freq: Every morning | ORAL | Status: DC
  Administered 2019-04-26 – 2019-05-10 (×15): 1 mg via ORAL
  Filled 2019-04-25 (×15): qty 2

## 2019-04-25 MED ORDER — METOPROLOL TARTRATE 25 MG PO TABS: 25.0000 mg | ORAL_TABLET | Freq: Two times a day (BID) | ORAL | 11 refills | Status: DC

## 2019-04-25 MED ORDER — CLONAZEPAM 2 MG PO TABS: 2.0000 mg | ORAL_TABLET | Freq: Every day | ORAL | 0 refills | Status: DC

## 2019-04-25 MED ORDER — ASPIRIN 325 MG PO TABS
325.0000 mg | ORAL_TABLET | Freq: Every day | ORAL | Status: DC
  Administered 2019-04-26 – 2019-05-10 (×15): 325 mg via ORAL
  Filled 2019-04-25 (×15): qty 1

## 2019-04-25 MED ORDER — CLOPIDOGREL BISULFATE 75 MG PO TABS: 75.0000 mg | ORAL_TABLET | Freq: Every day | ORAL | 0 refills | Status: DC

## 2019-04-25 MED ORDER — ACETAMINOPHEN 325 MG PO TABS: 650.0000 mg | ORAL_TABLET | ORAL | 0 refills | Status: DC | PRN

## 2019-04-25 MED ORDER — ATORVASTATIN CALCIUM 80 MG PO TABS: 80.0000 mg | ORAL_TABLET | Freq: Every day | ORAL | 0 refills | Status: DC

## 2019-04-25 MED ORDER — ENOXAPARIN SODIUM 30 MG/0.3ML ~~LOC~~ SOLN
30.0000 mg | SUBCUTANEOUS | Status: DC
  Administered 2019-04-26 – 2019-05-10 (×15): 30 mg via SUBCUTANEOUS
  Filled 2019-04-25 (×16): qty 0.3

## 2019-04-25 MED ORDER — HYDROCODONE-ACETAMINOPHEN 5-325 MG PO TABS
1.0000 | ORAL_TABLET | ORAL | Status: DC | PRN
  Administered 2019-04-25 – 2019-04-26 (×2): 2 via ORAL
  Administered 2019-04-26: 1 via ORAL
  Administered 2019-04-27 – 2019-04-28 (×4): 2 via ORAL
  Administered 2019-04-28 (×2): 1 via ORAL
  Administered 2019-04-28 – 2019-04-29 (×4): 2 via ORAL
  Administered 2019-04-30 (×3): 1 via ORAL
  Administered 2019-05-02 – 2019-05-04 (×6): 2 via ORAL
  Administered 2019-05-04: 1 via ORAL
  Administered 2019-05-05 – 2019-05-06 (×3): 2 via ORAL
  Administered 2019-05-07: 23:00:00 1 via ORAL
  Filled 2019-04-25 (×2): qty 2
  Filled 2019-04-25: qty 1
  Filled 2019-04-25 (×5): qty 2
  Filled 2019-04-25 (×2): qty 1
  Filled 2019-04-25 (×5): qty 2
  Filled 2019-04-25: qty 1
  Filled 2019-04-25: qty 2
  Filled 2019-04-25 (×2): qty 1
  Filled 2019-04-25 (×7): qty 2
  Filled 2019-04-25: qty 1
  Filled 2019-04-25 (×3): qty 2

## 2019-04-25 MED ORDER — CLONAZEPAM 0.5 MG PO TABS
2.0000 mg | ORAL_TABLET | Freq: Every day | ORAL | Status: DC
  Administered 2019-04-26 – 2019-05-09 (×14): 2 mg via ORAL
  Filled 2019-04-25 (×14): qty 4

## 2019-04-25 MED ORDER — ACETAMINOPHEN 160 MG/5ML PO SOLN: 650.0000 mg | ORAL | Status: DC | PRN

## 2019-04-25 MED ORDER — HYDRALAZINE HCL 25 MG PO TABS
25.0000 mg | ORAL_TABLET | Freq: Four times a day (QID) | ORAL | Status: DC
  Administered 2019-04-25 – 2019-04-26 (×3): 25 mg via ORAL
  Filled 2019-04-25 (×4): qty 1

## 2019-04-25 MED ORDER — PANTOPRAZOLE SODIUM 40 MG PO TBEC
40.0000 mg | DELAYED_RELEASE_TABLET | Freq: Two times a day (BID) | ORAL | Status: DC
  Administered 2019-04-26 – 2019-05-10 (×29): 40 mg via ORAL
  Filled 2019-04-25 (×29): qty 1

## 2019-04-25 MED ORDER — SORBITOL 70 % SOLN
30.0000 mL | Freq: Every day | Status: DC | PRN
  Administered 2019-04-25 – 2019-05-07 (×2): 30 mL via ORAL
  Filled 2019-04-25 (×2): qty 30

## 2019-04-25 MED ORDER — ASPIRIN 300 MG RE SUPP
300.0000 mg | Freq: Every day | RECTAL | Status: DC
  Filled 2019-04-25 (×4): qty 1

## 2019-04-25 MED ORDER — ENOXAPARIN SODIUM 30 MG/0.3ML ~~LOC~~ SOLN
30.0000 mg | SUBCUTANEOUS | Status: DC
  Administered 2019-04-25: 30 mg via SUBCUTANEOUS

## 2019-04-25 MED ORDER — CLOPIDOGREL BISULFATE 75 MG PO TABS
75.0000 mg | ORAL_TABLET | Freq: Every day | ORAL | Status: DC
  Administered 2019-04-26 – 2019-05-10 (×15): 75 mg via ORAL
  Filled 2019-04-25 (×15): qty 1

## 2019-04-25 MED ORDER — POLYETHYLENE GLYCOL 3350 17 G PO PACK: 17.0000 g | PACK | Freq: Every day | ORAL | 0 refills | Status: DC | PRN

## 2019-04-25 MED ORDER — HYDRALAZINE HCL 25 MG PO TABS: 25.0000 mg | ORAL_TABLET | Freq: Four times a day (QID) | ORAL | 0 refills | Status: DC

## 2019-04-25 MED ORDER — ACETAMINOPHEN 325 MG PO TABS
650.0000 mg | ORAL_TABLET | ORAL | Status: DC | PRN
  Administered 2019-04-28 – 2019-05-02 (×2): 650 mg via ORAL
  Filled 2019-04-25 (×4): qty 2

## 2019-04-25 MED ORDER — DOCUSATE SODIUM 100 MG PO CAPS
100.0000 mg | ORAL_CAPSULE | Freq: Every day | ORAL | Status: DC
  Administered 2019-04-26 – 2019-05-04 (×7): 100 mg via ORAL
  Filled 2019-04-25 (×9): qty 1

## 2019-04-25 MED ORDER — PANTOPRAZOLE SODIUM 40 MG PO TBEC: 40.0000 mg | DELAYED_RELEASE_TABLET | Freq: Two times a day (BID) | ORAL | 0 refills | Status: DC

## 2019-04-25 NOTE — PMR Pre-admission (Signed)
PMR Admission Coordinator Pre-Admission Assessment  Patient: Patricia Stanley is an 73 y.o., female MRN: [id number] DOB: 1945-05-16 Height: 5' 1.5" (156.2 cm) Weight: 41.5 kg              Insurance Information HMO: yes    PPO:      PCP:      IPA:      80/20:      OTHER:  PRIMARY: [employer]      Policy#: [id number]      Subscriber: pt CM Name: Chanetta Marshall      Phone#: [phone] ext [phone]     Fax#: [phone] Pre-Cert#: [id number] approved for 7 days with f/u with Montel Clock      Employer:  Benefits:  Phone #: [phone]     Name: 12/21 Eff. Date: 05/05/2017     Deduct: none      Out of Pocket Max: $3400      Life Max: none CIR: $295 co pay per day days 1 until 6      SNF: no co pay days 1 until 20: $178 per day days 21 until 100 Outpatient: $10 to $40 per visit     Co-Pay: visits per medical neccesity Home Health: 100%      Co-Pay: visits per medical neccesity DME: 80%     Co-Pay: 20% Providers: in network  SECONDARY: none        Medicaid Application Date:       Case Manager:  Disability Application Date:       Case Worker:   The "Data Collection Information Summary" for patients in Inpatient Rehabilitation Facilities with attached "Privacy Act Statement-Health Care Records" was provided and verbally reviewed with: Patient and Family  Emergency Contact Information Contact Information    Name Relation Home Work Mobile   Rochelle Spouse [phone]  [phone]     Current Medical History  Patient Admitting Diagnosis: CVA  History of Present Illness: 73 year old right-handed female with history of hypertension, depression, migraine headaches and quit smoking 23 years ago.  Patient presented 04/18/2019 with left-sided weakness x3 days with difficulty in speech and facial droop.  Noted blood pressure systolic in the 180s.  Cranial CT scan negative.  Patient did not receive TPA.  MRI of the brain showed a 1.4 cm acute infarction within the right corona radiata in addition to  multiple chronic lacunar infarcts within the left frontal lobe periventricular white matter as well as bilateral basal ganglia and thalami.  CTA of head and neck with extensive plaque right ICA with 65 to 70% stenosis as well as carotid Dopplers with right ICA 40 to 59% stenosis..  Patient underwent emergent right carotid enterectomy per vascular surgery Dr. Chestine Spore 04/21/2019.  Neurology services follow-up and maintained on aspirin 325 mg as well as Plavix x3 months then aspirin alone.  Patient is also maintained on Lovenox for DVT prophylaxis.  Echocardiogram with ejection fraction of 65% without emboli.  Gastroenterology consulted 04/22/2019 for history of unintentional weight loss with CT of the abdomen showing no definite malignancy but did show pneumomediastinum and fluid in the upper mediastinum and lower right neck consistent with postsurgical changes but in addition, did show distal esophageal thickening and raising the question of esophagitis or even possibly a mass.  Patient did have an endoscopy and colonoscopy 3-1/2 years ago by Dr. Laural Benes that showed endoscopy normal specifically without evidence of any esophagitis and with normal duodenal biopsies.  The colonoscopy was also reportedly negative.  Patient underwent esophagram/barium swallow showing  no evidence of esophageal leak.  No esophageal mass lesion evident.   Complete NIHSS TOTAL: 2 Glasgow Coma Scale Score: 15  Past Medical History  Past Medical History:  Diagnosis Date  . Closed fracture of coccyx ([place]) 2015   AFTER FALL  . Depression   . GERD (gastroesophageal reflux disease)   . Headache    MIGRAINES OCCASIONAL  . History of kidney stones    HAS NOW  . Hypertension     Family History  family history is not on file.  Prior Rehab/Hospitalizations:  Has the patient had prior rehab or hospitalizations prior to admission? Yes  Has the patient had major surgery during 100 days prior to admission? Yes  Current Medications    Current Facility-Administered Medications:  .  0.9 %  sodium chloride infusion, 500 mL, Intravenous, Once PRN, Setzer, Edman Circle, PA-C .  acetaminophen (TYLENOL) tablet 650 mg, 650 mg, Oral, Q4H PRN, 650 mg at 04/24/19 0751 **OR** acetaminophen (TYLENOL) 160 MG/5ML solution 650 mg, 650 mg, Per Tube, Q4H PRN **OR** acetaminophen (TYLENOL) suppository 650 mg, 650 mg, Rectal, Q4H PRN, Setzer, Edman Circle, PA-C .  alum & mag hydroxide-simeth (MAALOX/MYLANTA) 200-200-20 MG/5ML suspension 15-30 mL, 15-30 mL, Oral, Q2H PRN, Setzer, Edman Circle, PA-C .  aspirin suppository 300 mg, 300 mg, Rectal, Daily **OR** aspirin tablet 325 mg, 325 mg, Oral, Daily, Setzer, Edman Circle, PA-C, 325 mg at 04/25/19 6761 .  atenolol (TENORMIN) tablet 50 mg, 50 mg, Oral, QPM, Setzer, Edman Circle, PA-C, 50 mg at 04/24/19 1739 .  atorvastatin (LIPITOR) tablet 80 mg, 80 mg, Oral, q1800, Barbie Banner, PA-C, 80 mg at 04/24/19 1738 .  bisacodyl (DULCOLAX) suppository 10 mg, 10 mg, Rectal, Once, Swayze, Ava, DO .  clonazePAM (KLONOPIN) tablet 1 mg, 1 mg, Oral, q morning - 10a, Setzer, Edman Circle, PA-C, 1 mg at 04/25/19 9509 .  clonazePAM (KLONOPIN) tablet 2 mg, 2 mg, Oral, QHS, Setzer, Edman Circle, PA-C, 2 mg at 04/24/19 2103 .  clopidogrel (PLAVIX) tablet 75 mg, 75 mg, Oral, Daily, Barbie Banner, PA-C, 75 mg at 04/25/19 3267 .  docusate sodium (COLACE) capsule 100 mg, 100 mg, Oral, Daily, Barbie Banner, PA-C, 100 mg at 04/25/19 1245 .  enoxaparin (LOVENOX) injection 30 mg, 30 mg, Subcutaneous, Q24H, Setzer, Edman Circle, PA-C, 30 mg at 04/25/19 0919 .  guaiFENesin-dextromethorphan (ROBITUSSIN DM) 100-10 MG/5ML syrup 15 mL, 15 mL, Oral, Q4H PRN, Setzer, Edman Circle, PA-C .  hydrALAZINE (APRESOLINE) injection 5 mg, 5 mg, Intravenous, Q4H PRN, Setzer, Edman Circle, PA-C, 5 mg at 04/21/19 1033 .  hydrALAZINE (APRESOLINE) tablet 25 mg, 25 mg, Oral, Q6H, Swayze, Ava, DO, 25 mg at 04/25/19 1142 .  HYDROcodone-acetaminophen (NORCO/VICODIN) 5-325 MG per  tablet 1-2 tablet, 1-2 tablet, Oral, Q4H PRN, Marty Heck, MD, 2 tablet at 04/25/19 [phone] .  hydrocortisone cream 1 %, , Topical, TID PRN, Vertis Kelch, NP, 1 application at 83/38/25 2207 .  HYDROmorphone (DILAUDID) injection 0.5 mg, 0.5 mg, Intravenous, Q3H PRN, Marty Heck, MD, 0.5 mg at 04/23/19 2129 .  iohexol (OMNIPAQUE) 300 MG/ML solution 50 mL, 50 mL, Oral, Once PRN, Buccini, Robert, MD .  labetalol (NORMODYNE) injection 10 mg, 10 mg, Intravenous, Q10 min PRN, Barbie Banner, PA-C, 10 mg at 04/23/19 2129 .  lisinopril (ZESTRIL) tablet 10 mg, 10 mg, Oral, Daily, Swayze, Ava, DO, 10 mg at 04/25/19 0918 .  magnesium sulfate IVPB 2 g 50 mL, 2 g, Intravenous, Daily PRN, Vaughan Basta, Edman Circle,  PA-C .  metoprolol tartrate (LOPRESSOR) injection 2-5 mg, 2-5 mg, Intravenous, Q2H PRN, Setzer, Lynnell Jude, PA-C .  ondansetron [facility]) injection 4 mg, 4 mg, Intravenous, Q6H PRN, Milinda Antis, PA-C, 4 mg at 04/21/19 1531 .  pantoprazole (PROTONIX) EC tablet 40 mg, 40 mg, Oral, BID AC, Buccini, Robert, MD, 40 mg at 04/25/19 0917 .  phenol (CHLORASEPTIC) mouth spray 1 spray, 1 spray, Mouth/Throat, PRN, Setzer, Lynnell Jude, PA-C .  polyethylene glycol (MIRALAX / GLYCOLAX) packet 17 g, 17 g, Oral, Daily PRN, Swayze, Ava, DO, 17 g at 04/24/19 2104  Patients Current Diet:  Diet Order            Diet - low sodium heart healthy        Diet Heart Room service appropriate? Yes with Assist; Fluid consistency: Thin  Diet effective now              Precautions / Restrictions Precautions Precautions: Fall Precaution Comments: L hemi Restrictions Weight Bearing Restrictions: No   Has the patient had 2 or more falls or a fall with injury in the past year?No  Prior Activity Level Household: gradual decline over  past year; very sedentary  Prior Functional Level Prior Function Level of Independence: Needs assistance Gait / Transfers Assistance Needed: did not use AD; used walls  in home ot hold onto ADL's / Homemaking Assistance Needed: family assist pt into shower and on the chair,  Communication / Swallowing Assistance Needed: spouse states her mind isn't as fast as it used to be, she's had a hard time explaining things to me Comments: spouse states she hasn't been out of the house in a  year  Self Care: Did the patient need help bathing, dressing, using the toilet or eating?  Needed some help  Indoor Mobility: Did the patient need assistance with walking from room to room (with or without device)? Needed some help  Stairs: Did the patient need assistance with internal or external stairs (with or without device)? Needed some help  Functional Cognition: Did the patient need help planning regular tasks such as shopping or remembering to take medications? Needed some help  Home Assistive Devices / Equipment Home Assistive Devices/Equipment: None Home Equipment: Walker - 2 wheels, Bedside commode, Shower seat, Grab bars - tub/shower, Hand held shower head  Prior Device Use: Indicate devices/aids used by the patient prior to current illness, exacerbation or injury? None of the above  Current Functional Level Cognition  Arousal/Alertness: Awake/alert Overall Cognitive Status: Impaired/Different from baseline Orientation Level: Oriented X4 Following Commands: Follows one step commands with increased time, Follows multi-step commands inconsistently Safety/Judgement: Decreased awareness of safety, Decreased awareness of deficits General Comments: pt slow to process overall, unable to recall room number stated at start of session but was able to recall date at end of session. Attention: Focused, Sustained Focused Attention: Appears intact Sustained Attention: Impaired Sustained Attention Impairment: Verbal basic, Functional basic Memory: Impaired Memory Impairment: Storage deficit, Retrieval deficit, Decreased recall of new information, Decreased short term  memory Decreased Short Term Memory: Verbal basic, Functional basic Awareness: Impaired Awareness Impairment: Intellectual impairment Problem Solving: Impaired Problem Solving Impairment: Verbal basic, Functional basic Executive Function: Organizing, Self Monitoring Organizing: Impaired Self Monitoring: Impaired Safety/Judgment: Impaired    Extremity Assessment (includes Sensation/Coordination)  Upper Extremity Assessment: LUE deficits/detail LUE Deficits / Details: grip 2/5, elbow flexion 3-/5, shld flexion with 2+/5, sensation deficit LUE Sensation: decreased light touch, decreased proprioception LUE Coordination: decreased gross motor, decreased fine motor  Lower Extremity Assessment: LLE deficits/detail LLE Deficits / Details: grossly 3-/5, decreased sensation LLE Sensation: decreased light touch    ADLs  Overall ADL's : Needs assistance/impaired Eating/Feeding: Set up, Sitting Eating/Feeding Details (indicate cue type and reason): assist cutting up food, opening containers as patient is L hand dominant and having to use mainly R hand for self care tasks Grooming: Set up, Sitting Grooming Details (indicate cue type and reason): apply lotion to L hand, set- up assist , able to open can with R hand using L hand as stabilizer, supervision for safety not to spill Upper Body Bathing: Sitting, Moderate assistance Upper Body Bathing Details (indicate cue type and reason): due to deficits in L UE Lower Body Bathing: Sitting/lateral leans, Total assistance Lower Body Bathing Details (indicate cue type and reason): decreased trunk control Upper Body Dressing : Moderate assistance, Sitting Upper Body Dressing Details (indicate cue type and reason): due to limited trunk control and patient limited use of dominant hand Lower Body Dressing: Total assistance, Sitting/lateral leans Lower Body Dressing Details (indicate cue type and reason): limited trunk control, limited use of L UE Toilet  Transfer: Minimal assistance, Moderate assistance, RW, Ambulation Toilet Transfer Details (indicate cue type and reason): simulated via functional mobility, MOD A for balance as pt leans to L and scissors L leg needing cues to self- correct Toileting- Clothing Manipulation and Hygiene: Total assistance, Sitting/lateral lean Functional mobility during ADLs: Moderate assistance, Cueing for safety, Rolling walker General ADL Comments: session focus on functional mobility and seated therex to facilitate LUE ROM for BADL participation    Mobility  Overal bed mobility: Needs Assistance Bed Mobility: Supine to Sit, Sit to Supine Rolling: Min guard Sidelying to sit: Min assist Supine to sit: Min assist Sit to supine: Min assist General bed mobility comments: OOB in recliner    Transfers  Overall transfer level: Needs assistance Equipment used: Rolling walker (2 wheeled) Transfers: Sit to/from Stand Sit to Stand: Min assist Stand pivot transfers: Mod assist General transfer comment: MIN A sit>stand, cues for hand placement    Ambulation / Gait / Stairs / Wheelchair Mobility  Ambulation/Gait Ambulation/Gait assistance: Mod assist Gait Distance (Feet): 8 Feet(3 trials of 8') Assistive device: Rolling walker (2 wheeled) Gait Pattern/deviations: Step-to pattern, Scissoring General Gait Details: pt with short step to gait with inconsistent placement of L foot, intermittent scissoring. PT providing verbal and visual cues to increase width of base of support Gait velocity: reduced Gait velocity interpretation: <1.31 ft/sec, indicative of household ambulator    Posture / Balance Dynamic Sitting Balance Sitting balance - Comments: supervision Balance Overall balance assessment: Needs assistance Sitting-balance support: Single extremity supported, Feet supported Sitting balance-Leahy Scale: Good Sitting balance - Comments: supervision Postural control: Right lateral lean, Posterior  lean Standing balance support: Bilateral upper extremity supported Standing balance-Leahy Scale: Poor Standing balance comment: modA with posterior lean at times    Special needs/care consideration BiPAP/CPAP CPM Continuous Drip IV Dialysis      Life Vest Oxygen Special Bed Trach Size Wound Vac  Skin surgical incision Bowel mgmt:LBM 12/17 continent Bladder mgmt:external catheter Diabetic mgmt Hgb A1c 5.8 Behavioral consideration  chemo/radiation  Designated visitor is spouse Peyton NajjarLarry   Previous Home Environment  Living Arrangements: Spouse/significant other, Children(daughter lives with parents)  Lives With: Spouse, Daughter Available Help at Discharge: Family, Available 24 hours/day Type of Home: House Home Layout: One level Home Access: Stairs to enter Entrance Stairs-Rails: Can reach both Entrance Stairs-Number of Steps: 4 Bathroom Shower/Tub: Tub/shower unit  Bathroom Toilet: Pharmacist, community: Yes How Accessible: Accessible via walker Home Care Services: No  Discharge Living Setting Plans for Discharge Living Setting: Patient's home, Lives with (comment)(spouse and daughter) Type of Home at Discharge: House Discharge Home Layout: One level Discharge Home Access: Stairs to enter Entrance Stairs-Rails: Right, Left, Can reach both Entrance Stairs-Number of Steps: 4 Discharge Bathroom Shower/Tub: Tub/shower unit Discharge Bathroom Toilet: Standard Discharge Bathroom Accessibility: Yes How Accessible: Accessible via walker Does the patient have any problems obtaining your medications?: No  Social/Family/Support Systems Patient Roles: Spouse, Parent Contact Information: spouse, Peyton Najjar Anticipated Caregiver: spouse and daughters Anticipated Caregiver's Contact Information: [phone] Ability/Limitations of Caregiver: spouse works, but he and daughter can work out to provide 24/7 supervision Caregiver Availability: 24/7 Discharge Plan Discussed with  Primary Caregiver: Yes Is Caregiver In Agreement with Plan?: Yes Does Caregiver/Family have Issues with Lodging/Transportation while Pt is in Rehab?: No  Goals/Additional Needs Patient/Family Goal for Rehab: supervision PT and SLP, supervision to min assist OT Expected length of stay: ELOS 10 to 14 days Pt/Family Agrees to Admission and willing to participate: Yes Program Orientation Provided & Reviewed with Pt/Caregiver Including Roles  & Responsibilities: Yes  Decrease burden of Care through IP rehab admission:   Possible need for SNF placement upon discharge:   Patient Condition: This patient's medical and functional status has changed since the consult dated: 04/19/2019 in which the Rehabilitation Physician determined and documented that the patient's condition is appropriate for intensive rehabilitative care in an inpatient rehabilitation facility. See "History of Present Illness" (above) for medical update. Functional changes are: mod assist. Patient's medical and functional status update has been discussed with the Rehabilitation physician and patient remains appropriate for inpatient rehabilitation. Will admit to inpatient rehab today.  Preadmission Screen Completed By:  Clois Dupes, RN, 04/25/2019 12:34 PM ______________________________________________________________________   Discussed status with Dr. Carlis Abbott on 04/25/2019 at  1245 and received approval for admission today.  Admission Coordinator:  Clois Dupes, time 9629 Date 04/25/2019

## 2019-04-25 NOTE — Progress Notes (Signed)
Occupational Therapy Treatment Patient Details Name: Patricia Stanley MRN: [id number] DOB: February 01, 1946 Today's Date: 04/25/2019    History of present illness Patricia Stanley is a 73 y.o. female with medical history significant of hypertension, migraine headaches, depression, GERD being brought to the hospital via EMS for evaluation of left-sided weakness. In the ER Brain MRI showing a 1.4 cm acute infarct within the right corona radiata.  Per vascular note pt declined carotid endarectomy ; however, pt reporting that she has changed her mind (RN was notified).   OT comments  Pt making steady progress towards OT goals this session. Session focus on functional mobility and LUE therex to increase ROM for BADL participation. Pt completes household distance functional mobility with RW and MOD A as pt presents with scissoring pattern with LLE with slight posterior lean, needing cues to correct. Pt completed LUE therex as indicated below via AAROM. Pt able to complete functional grasp and release task with supervision for safety. Pt continues to be an excellent candidate for CIR level therapies as pt continues to progress with therapies and is motivated to return home with family support. Will continue to follow acutely per POC.   Follow Up Recommendations  CIR;Supervision/Assistance - 24 hour    Equipment Recommendations  Other (comment)(TBD)    Recommendations for Other Services      Precautions / Restrictions Precautions Precautions: Fall Precaution Comments: L hemi Restrictions Weight Bearing Restrictions: No       Mobility Bed Mobility               General bed mobility comments: OOB in recliner  Transfers Overall transfer level: Needs assistance Equipment used: Rolling walker (2 wheeled) Transfers: Sit to/from Stand Sit to Stand: Min assist         General transfer comment: MIN A sit>stand, cues for hand placement    Balance Overall balance assessment: Needs  assistance Sitting-balance support: Single extremity supported;Feet supported Sitting balance-Leahy Scale: Good Sitting balance - Comments: supervision   Standing balance support: Bilateral upper extremity supported Standing balance-Leahy Scale: Poor Standing balance comment: modA with posterior lean at times                           ADL either performed or assessed with clinical judgement   ADL Overall ADL's : Needs assistance/impaired     Grooming: Set up;Sitting Grooming Details (indicate cue type and reason): apply lotion to L hand, set- up assist , able to open can with R hand using L hand as stabilizer, supervision for safety not to spill                 Toilet Transfer: Minimal assistance;Moderate assistance;RW;Ambulation Toilet Transfer Details (indicate cue type and reason): simulated via functional mobility, MOD A for balance as pt leans to L and scissors L leg needing cues to self- correct         Functional mobility during ADLs: Moderate assistance;Cueing for safety;Rolling walker General ADL Comments: session focus on functional mobility and seated therex to facilitate LUE ROM for BADL participation     Vision Patient Visual Report: No change from baseline     Perception     Praxis      Cognition Arousal/Alertness: Awake/alert Behavior During Therapy: WFL for tasks assessed/performed Overall Cognitive Status: Impaired/Different from baseline Area of Impairment: Memory;Following commands;Problem solving  Memory: Decreased short-term memory Following Commands: Follows one step commands with increased time;Follows multi-step commands inconsistently     Problem Solving: Slow processing;Decreased initiation;Requires verbal cues General Comments: pt slow to process overall, unable to recall room number stated at start of session but was able to recall date at end of session.        Exercises General Exercises -  Upper Extremity Shoulder Flexion: AAROM;Left;10 reps;Seated Hand Exercises Digit Composite Flexion: AROM;Left;10 reps;Seated Composite Extension: AROM;Left;10 reps;Seated Other Exercises Other Exercises: opening drink can with R hand L hand in cylindrical grasp to stabilize Other Exercises: functional grasp/ release with L hand via reaching for cup   Shoulder Instructions       General Comments BP in recliner at start of session, 153/81 HR, 87- RN okay for session to proceed as pt has not had her BP meds    Pertinent Vitals/ Pain       Pain Assessment: 0-10 Pain Score: 6  Pain Location: neck Pain Descriptors / Indicators: Sore;Aching;Grimacing;Discomfort Pain Intervention(s): Limited activity within patient's tolerance;Monitored during session;Repositioned  Home Living                                          Prior Functioning/Environment              Frequency  Min 2X/week        Progress Toward Goals  OT Goals(current goals can now be found in the care plan section)  Progress towards OT goals: Progressing toward goals  Acute Rehab OT Goals Patient Stated Goal: Pt more agreeable to rehab, wants to return to baseline OT Goal Formulation: With patient Time For Goal Achievement: 05/03/19 Potential to Achieve Goals: Good  Plan Discharge plan remains appropriate    Co-evaluation                 AM-PAC OT "6 Clicks" Daily Activity     Outcome Measure   Help from another person eating meals?: None Help from another person taking care of personal grooming?: A Little Help from another person toileting, which includes using toliet, bedpan, or urinal?: Total Help from another person bathing (including washing, rinsing, drying)?: A Lot Help from another person to put on and taking off regular upper body clothing?: A Lot Help from another person to put on and taking off regular lower body clothing?: Total 6 Click Score: 13    End of  Session Equipment Utilized During Treatment: Gait belt;Rolling walker  OT Visit Diagnosis: Unsteadiness on feet (R26.81);Other abnormalities of gait and mobility (R26.89);Muscle weakness (generalized) (M62.81);Other symptoms and signs involving cognitive function;Other symptoms and signs involving the nervous system (R29.898);Hemiplegia and hemiparesis Hemiplegia - Right/Left: Left Hemiplegia - dominant/non-dominant: Dominant Hemiplegia - caused by: Cerebral infarction   Activity Tolerance Patient tolerated treatment well   Patient Left in chair;with call bell/phone within reach   Nurse Communication Mobility status;Other (comment)(BP high)        Time: 3500-9381 OT Time Calculation (min): 22 min  Charges: OT General Charges $OT Visit: 1 Visit OT Treatments $Therapeutic Activity: 8-22 mins  Audery Amel., COTA/L Acute Rehabilitation Services [phone]    Angelina Pih 04/25/2019, 9:21 AM

## 2019-04-25 NOTE — Discharge Summary (Signed)
Physician Discharge Summary  Patricia Stanley WGN:[id number] DOB: [birth year] DOA: 04/18/2019  PCP: Marden Noble, MD  Admit date: 04/18/2019 Discharge date: 04/25/2019  Recommendations for Outpatient Follow-up:  1. Discharge to inpatient rehab. 2. Follow up with vascular surgery as directed. 3. Follow up with neurology as directed. 4. Follow up with gastroenterology as directed. 5. Follow up with PCP in 7-10 days after discharge from rehab.  Follow-up Information    Micki Riley, MD. Schedule an appointment as soon as possible for a visit in 4 week(s).   Specialties: Neurology, Radiology Contact information: [address] [place] [address] [phone]          Discharge Diagnoses: Principal diagnosis is #1 1. Acute CVA or the right corona radiata 2. Rt ICA stenosis 3. S/P CEA Rt ICA 4. Constipation 5. Unintentional Weight loss 6. Hypertension 7. Hyperlipidemia  Discharge Condition: Fair  Disposition: CIR  Diet recommendation: Heart healthy  Filed Weights   04/19/19 0415 04/20/19 0416 04/21/19 0358  Weight: 42.4 kg 44.2 kg 41.5 kg    History of present illness:  Patricia Stanley is a 73 y.o. female with medical history significant of hypertension, migraine headaches, depression, GERD being brought to the hospital via EMS for evaluation of left-sided weakness.  History provided by patient and husband at bedside.  For the past 3 days she has had left-sided weakness.  No numbness.  Family has not noticed any difficulty with speech or facial droop.  She has never had a stroke before and is currently not on aspirin.  Husband at bedside is concerned that over the past 1 year patient has lost a significant amount of weight.  She has had loss of appetite and intermittent diarrhea.  No diarrhea recently.  Husband states patient had a colonoscopy done 3 years ago as she was having GI bleed at that time.  States patient has canceled appointments with her PCP  and has not had follow-up to discuss her weight loss.  She is a former smoker- 3/4 PPD x 10-12 yrs, quit 30 yrs ago.  ED Course: Blood pressure is elevated with systolic up to 180s.  Head CT negative for acute finding.  Brain MRI showing a 1.4 cm acute infarct within the right corona radiata.  In addition, multiple chronic lacunar infarcts within the left frontal lobe periventricular white matter as well as bilateral basal ganglia and thalami. Patient received 1 L normal saline bolus.  Neurology was consulted.  Hospital Course:  Patricia Stanley a 73 y.o.femalewith medical history significant ofhypertension, migraine headaches, depression, GERD being brought to the hospital via EMS for evaluation of left-sided weakness.In the ERBrain MRI showing a 1.4 cm acute infarct within the right corona radiataand she was admitted for further stroke workup. Started on daily ASA, TTE pending and CTA head and neck shows extensive atherosclerotic changes as well as significant stenosis in the right ICA.  Vascular surgery has been consulted. The patient underwent CEA for Right Carotid stenosis with Dr. Chestine Spore on 04/21/2019.   CTA of the chest abdomen and pelvis was obtained on 04/21/2019. It demonstrated thickening of the esophageal mucosa, and dilatation of the bile ducts. There was also possibly a pneumomediastinum, likely due to CEA yesterday.  The patient has been evaluated by gastroenterology. Esophagram was ordered there is no evidence for esophageal leak. There is no esophageal mass lesion evident. GI has signed off. Recommendation is for outpatient work up for dilated common bile duct and pancreatic duct with normal  LFT's. Outpatient work up is also recommended for IBS-type diarrhea and work up for weight loss.   The patient is awaiting CIR bed.  Today's assessment: S: The patient is resting comfortably. No new complaints. O: Vitals:  Vitals:   04/25/19 0910 04/25/19 0918  BP: (!) 153/84  (!) 153/84  Pulse: 87   Resp:    Temp:    SpO2:     Constitutional:   The patient is awake, alert, and oriented x 3. Mild distress from nausea. Respiratory:   No increased work of breathing.  No wheezes, rales, or rhonchi  No tactile fremitus Cardiovascular:   Regular rate and rhythm  No murmurs, ectopy, or gallups.  No lateral PMI. No thrills. Abdomen:   Abdomen is soft, non-tender, non-distended  No hernias, masses, or organomegaly  Hypoactive bowel sounds.  Musculoskeletal:   No cyanosis, clubbing, or edema Skin:   No rashes, lesions, ulcers  palpation of skin: no induration or nodules Neurologic:   Left sided facial droop and weakness.   Psychiatric:   Mental status ? Mood, affect appropriate ? Orientation to person, place, time   judgment and insight appear intact   Discharge Instructions  Discharge Instructions    Activity as tolerated - No restrictions   Complete by: As directed    Ambulatory referral to Neurology   Complete by: As directed    Follow up with Dr. Pearlean Brownie at [facility] in 4-6 weeks. Too complicated for NP to follow. Thanks.   Call MD for:   Complete by: As directed    New lateralizing weakness, confusion, inability to speak, swallow, walk, asymmetrical facial expression.   Call MD for:  difficulty breathing, headache or visual disturbances   Complete by: As directed    Call MD for:  persistant dizziness or light-headedness   Complete by: As directed    Call MD for:  redness, tenderness, or signs of infection (pain, swelling, redness, odor or green/yellow discharge around incision site)   Complete by: As directed    Diet - low sodium heart healthy   Complete by: As directed    Discharge instructions   Complete by: As directed    Discharge to inpatient rehab. Follow up with vascular surgery as directed. Follow up with neurology as directed. Follow up with gastroenterology as directed. Follow up with PCP in 7-10 days after  discharge from rehab.   Increase activity slowly   Complete by: As directed      Allergies as of 04/25/2019      Reactions   Paroxetine Other (See Comments)   Shakes-tremors      Medication List    STOP taking these medications   cloNIDine 0.3 MG tablet Commonly known as: CATAPRES   ondansetron 4 MG tablet Commonly known as: ZOFRAN   simvastatin 40 MG tablet Commonly known as: ZOCOR   topiramate 100 MG tablet Commonly known as: TOPAMAX     TAKE these medications   acetaminophen 325 MG tablet Commonly known as: TYLENOL Take 2 tablets (650 mg total) by mouth every 4 (four) hours as needed for mild pain (or temp > 37.5 C (99.5 F)).   aspirin 325 MG tablet Take 1 tablet (325 mg total) by mouth daily. Start taking on: April 26, 2019   atenolol 50 MG tablet Commonly known as: TENORMIN Take 50 mg by mouth every evening.   atorvastatin 80 MG tablet Commonly known as: LIPITOR Take 1 tablet (80 mg total) by mouth daily at 6 PM.  clonazePAM 2 MG tablet Commonly known as: KLONOPIN Take 1 tablet (2 mg total) by mouth at bedtime. What changed: You were already taking a medication with the same name, and this prescription was added. Make sure you understand how and when to take each.   clonazePAM 1 MG tablet Commonly known as: KLONOPIN Take 1 tablet (1 mg total) by mouth every morning. Start taking on: April 26, 2019 What changed: when to take this   clopidogrel 75 MG tablet Commonly known as: PLAVIX Take 1 tablet (75 mg total) by mouth daily. Start taking on: April 26, 2019   hydrALAZINE 25 MG tablet Commonly known as: APRESOLINE Take 1 tablet (25 mg total) by mouth every 6 (six) hours.   HYDROcodone-acetaminophen 5-325 MG tablet Commonly known as: NORCO/VICODIN Take 1-2 tablets by mouth every 4 (four) hours as needed for moderate pain.   lisinopril 10 MG tablet Commonly known as: ZESTRIL Take 1 tablet (10 mg total) by mouth daily. Start taking on:  April 26, 2019   metoprolol tartrate 25 MG tablet Commonly known as: LOPRESSOR Take 1 tablet (25 mg total) by mouth 2 (two) times daily.   pantoprazole 40 MG tablet Commonly known as: PROTONIX Take 1 tablet (40 mg total) by mouth 2 (two) times daily before a meal.   polyethylene glycol 17 g packet Commonly known as: MIRALAX / GLYCOLAX Take 17 g by mouth daily as needed for mild constipation.      Allergies  Allergen Reactions  . Paroxetine Other (See Comments)    Shakes-tremors    The results of significant diagnostics from this hospitalization (including imaging, microbiology, ancillary and laboratory) are listed below for reference.    Significant Diagnostic Studies: CT ANGIO HEAD W OR WO CONTRAST  Result Date: 04/19/2019 CLINICAL DATA:  Initial evaluation for acute stroke. EXAM: CT ANGIOGRAPHY HEAD AND NECK TECHNIQUE: Multidetector CT imaging of the head and neck was performed using the standard protocol during bolus administration of intravenous contrast. Multiplanar CT image reconstructions and MIPs were obtained to evaluate the vascular anatomy. Carotid stenosis measurements (when applicable) are obtained utilizing NASCET criteria, using the distal internal carotid diameter as the denominator. CONTRAST:  18mL OMNIPAQUE IOHEXOL 350 MG/ML SOLN COMPARISON:  Prior CT and MRI from 04/18/2019. FINDINGS: CTA NECK FINDINGS Aortic arch: Visualized aortic arch of normal caliber with normal 3 vessel morphology. Moderate atherosclerotic change seen about the aortic arch and origin of the great vessels. Atheromatous irregularity with stenoses measuring up to 30% seen at the mid left subclavian artery beyond the takeoff of the left vertebral artery. Visualized right subclavian artery widely patent. Right carotid system: Right CCA tortuous proximally. Scattered atheromatous irregularity within the right CCA without hemodynamically significant stenosis. Mix concentric plaque about the right  bifurcation/proximal right ICA. Resultant stenosis of up to 65-70% by NASCET criteria. Right ICA otherwise widely patent distally to the skull base without stenosis, dissection, or occlusion. Left carotid system: Atheromatous plaque at the origin of the left CCA with short-segment 50% stenosis. Additional moderate approximate 50% diffuse narrowing seen just distally within the proximal left CCA (series 8, image 293). Left CCA otherwise patent to the bifurcation without flow-limiting stenosis. Mild plaque about the left bifurcation and proximal left ICA without hemodynamically significant stenosis. Left ICA otherwise patent distally to the skull base without stenosis, dissection or occlusion. Vertebral arteries: Both vertebral arteries arise from the subclavian arteries. Atheromatous plaque at the origin of the vertebral arteries bilaterally without high-grade stenosis. Vertebral arteries mildly irregular but widely  patent distally to the skull base without stenosis, dissection or occlusion. Skeleton: No acute osseous finding. No discrete lytic or blastic osseous lesions. Mild to moderate cervical spondylolysis noted at C5-6. Other neck: No other acute soft tissue abnormality within the neck. No mass lesion or adenopathy. Upper chest: Visualized upper chest demonstrates no acute finding. Subpleural fibrotic changes noted within the partially visualized lungs. Review of the MIP images confirms the above findings CTA HEAD FINDINGS Anterior circulation: Petrous segments patent bilaterally. Scattered atheromatous plaque within the cavernous/supraclinoid ICAs bilaterally, left worse than right. Resultant mild to moderate narrowing at the para clinoid right ICA. There is a short-segment fairly severe stenosis at the supraclinoid left ICA (series 8, image 108). ICA termini well perfused. Right A1 widely patent. Left A1 hypoplastic and/or absent. Normal anterior communicating artery. Multifocal atheromatous irregularity with  stenoses seen involving the ACAs bilaterally. Both ICAs are perfused to their distal aspects. Irregular moderate approximate 50% stenosis seen involving the proximal right M1 segment (series 8, image 106). Area of involvement measures approximately 7 mm in length. Right M1 patent distally. Normal right MCA bifurcation. Distal right MCA branches are well perfused, although demonstrate extensive small vessel atheromatous irregularity. Left M1 irregular with short-segment moderate stenosis at its mid aspect. Negative left MCA bifurcation. Extensive atheromatous irregularity with multifocal stenoses seen involving the distal left MCA branches. Specific note made of a short-segment severe proximal left M2 stenosis (series 8, image 100). Posterior circulation: Right vertebral artery widely patent to the vertebrobasilar junction without significant stenosis. Patent right PICA. Short-segment severe stenosis seen involving the mid left V4 segment just prior to the takeoff of the left PICA (series 8, image 138). Left PICA patent. Additional short-segment moderate stenosis at the distal left V4 segment just prior to the vertebrobasilar junction. Multifocal atheromatous or layering 80 seen involving the basilar artery with associated mild narrowing at its mid aspect. Superior cerebral arteries patent bilaterally. Both PCAs primarily supplied via the basilar. PCAs are fairly diseased with extensive atheromatous irregularity and moderate to severe multifocal stenoses bilaterally. There is an apparent 2 mm focal outpouching arising from the mid left P2 segment, suspicious for a small aneurysm (series 9, image 108). Venous sinuses: Patent. Anatomic variants: None significant. Review of the MIP images confirms the above findings IMPRESSION: 1. Negative CTA for large vessel occlusion. 2. Extensive mixed plaque about the origin of the right ICA with associated stenosis of up to 65-70% by NASCET criteria. 3. Multifocal moderate 50%  stenoses seen involving the origin and proximal left common carotid artery as above. 4. Extensive atheromatous disease throughout the intracranial circulation with multifocal moderate to severe stenoses as above. Notable findings include a severe stenosis at the supraclinoid left ICA, moderate proximal right M1 and mid left M1 stenoses, with moderate to severe multifocal left V4 stenoses. Both PCAs are heavily diseased with extensive atheromatous irregularity. 5. 2 mm focal outpouching arising from the right PCA as above, suspicious for a small aneurysm. Electronically Signed   By: Rise Mu M.D.   On: 04/19/2019 04:47   DG Chest 2 View  Result Date: 04/19/2019 CLINICAL DATA:  Left-sided weakness, expressive aphasia and confusion for the past 2 days. EXAM: CHEST - 2 VIEW COMPARISON:  08/24/2012 FINDINGS: Grossly unchanged cardiac silhouette and mediastinal contours with atherosclerotic plaque within the thoracic aorta. There is persistent thickening the right paratracheal stripe presumably secondary to prominent vasculature. Lungs remain hyperexpanded with flattening of the diaphragms and diffuse slightly nodular thickening of the pulmonary interstitium.  No discrete focal airspace opacities. No pleural effusion or pneumothorax. No evidence of edema. Old/healed fractures involving the posterolateral aspect of the left ninth and tenth ribs. No acute osseous abnormalities. IMPRESSION: 1. Similar findings of lung hyperexpansion and chronic bronchitic change without superimposed acute cardiopulmonary disease. 2.  Aortic Atherosclerosis (ICD10-I70.0). Electronically Signed   By: Simonne Come M.D.   On: 04/19/2019 08:14   CT HEAD WO CONTRAST  Result Date: 04/18/2019 CLINICAL DATA:  Focal neural deficit for more than 6 hours suspected stroke, history hypertension, former smoker EXAM: CT HEAD WITHOUT CONTRAST TECHNIQUE: Contiguous axial images were obtained from the base of the skull through the vertex  without intravenous contrast. Sagittal and coronal MPR images reconstructed from axial data set. COMPARISON:  08/24/2012 FINDINGS: Brain: Generalized atrophy. Normal ventricular morphology. No midline shift or mass effect. Small vessel chronic ischemic changes of deep cerebral white matter. Old BILATERAL caudate lacunar infarcts. Old BILATERAL basal ganglia lacunar infarcts with extension on LEFT into periventricular white matter in the LEFT frontal lobe. No intracranial hemorrhage, mass lesion or evidence of acute infarction. No extra-axial fluid collections. Vascular: Atherosclerotic calcification of internal carotid arteries at skull base Skull: Intact Sinuses/Orbits: Clear Other: N/A IMPRESSION: Atrophy with small vessel chronic ischemic changes of deep cerebral white matter. Old lacunar infarcts as above. No acute intracranial abnormalities. Electronically Signed   By: Ulyses Southward M.D.   On: 04/18/2019 14:40   CT ANGIO NECK W OR WO CONTRAST  Result Date: 04/19/2019 CLINICAL DATA:  Initial evaluation for acute stroke. EXAM: CT ANGIOGRAPHY HEAD AND NECK TECHNIQUE: Multidetector CT imaging of the head and neck was performed using the standard protocol during bolus administration of intravenous contrast. Multiplanar CT image reconstructions and MIPs were obtained to evaluate the vascular anatomy. Carotid stenosis measurements (when applicable) are obtained utilizing NASCET criteria, using the distal internal carotid diameter as the denominator. CONTRAST:  80mL OMNIPAQUE IOHEXOL 350 MG/ML SOLN COMPARISON:  Prior CT and MRI from 04/18/2019. FINDINGS: CTA NECK FINDINGS Aortic arch: Visualized aortic arch of normal caliber with normal 3 vessel morphology. Moderate atherosclerotic change seen about the aortic arch and origin of the great vessels. Atheromatous irregularity with stenoses measuring up to 30% seen at the mid left subclavian artery beyond the takeoff of the left vertebral artery. Visualized right  subclavian artery widely patent. Right carotid system: Right CCA tortuous proximally. Scattered atheromatous irregularity within the right CCA without hemodynamically significant stenosis. Mix concentric plaque about the right bifurcation/proximal right ICA. Resultant stenosis of up to 65-70% by NASCET criteria. Right ICA otherwise widely patent distally to the skull base without stenosis, dissection, or occlusion. Left carotid system: Atheromatous plaque at the origin of the left CCA with short-segment 50% stenosis. Additional moderate approximate 50% diffuse narrowing seen just distally within the proximal left CCA (series 8, image 293). Left CCA otherwise patent to the bifurcation without flow-limiting stenosis. Mild plaque about the left bifurcation and proximal left ICA without hemodynamically significant stenosis. Left ICA otherwise patent distally to the skull base without stenosis, dissection or occlusion. Vertebral arteries: Both vertebral arteries arise from the subclavian arteries. Atheromatous plaque at the origin of the vertebral arteries bilaterally without high-grade stenosis. Vertebral arteries mildly irregular but widely patent distally to the skull base without stenosis, dissection or occlusion. Skeleton: No acute osseous finding. No discrete lytic or blastic osseous lesions. Mild to moderate cervical spondylolysis noted at C5-6. Other neck: No other acute soft tissue abnormality within the neck. No mass lesion or adenopathy. Upper  chest: Visualized upper chest demonstrates no acute finding. Subpleural fibrotic changes noted within the partially visualized lungs. Review of the MIP images confirms the above findings CTA HEAD FINDINGS Anterior circulation: Petrous segments patent bilaterally. Scattered atheromatous plaque within the cavernous/supraclinoid ICAs bilaterally, left worse than right. Resultant mild to moderate narrowing at the para clinoid right ICA. There is a short-segment fairly severe  stenosis at the supraclinoid left ICA (series 8, image 108). ICA termini well perfused. Right A1 widely patent. Left A1 hypoplastic and/or absent. Normal anterior communicating artery. Multifocal atheromatous irregularity with stenoses seen involving the ACAs bilaterally. Both ICAs are perfused to their distal aspects. Irregular moderate approximate 50% stenosis seen involving the proximal right M1 segment (series 8, image 106). Area of involvement measures approximately 7 mm in length. Right M1 patent distally. Normal right MCA bifurcation. Distal right MCA branches are well perfused, although demonstrate extensive small vessel atheromatous irregularity. Left M1 irregular with short-segment moderate stenosis at its mid aspect. Negative left MCA bifurcation. Extensive atheromatous irregularity with multifocal stenoses seen involving the distal left MCA branches. Specific note made of a short-segment severe proximal left M2 stenosis (series 8, image 100). Posterior circulation: Right vertebral artery widely patent to the vertebrobasilar junction without significant stenosis. Patent right PICA. Short-segment severe stenosis seen involving the mid left V4 segment just prior to the takeoff of the left PICA (series 8, image 138). Left PICA patent. Additional short-segment moderate stenosis at the distal left V4 segment just prior to the vertebrobasilar junction. Multifocal atheromatous or layering 80 seen involving the basilar artery with associated mild narrowing at its mid aspect. Superior cerebral arteries patent bilaterally. Both PCAs primarily supplied via the basilar. PCAs are fairly diseased with extensive atheromatous irregularity and moderate to severe multifocal stenoses bilaterally. There is an apparent 2 mm focal outpouching arising from the mid left P2 segment, suspicious for a small aneurysm (series 9, image 108). Venous sinuses: Patent. Anatomic variants: None significant. Review of the MIP images confirms  the above findings IMPRESSION: 1. Negative CTA for large vessel occlusion. 2. Extensive mixed plaque about the origin of the right ICA with associated stenosis of up to 65-70% by NASCET criteria. 3. Multifocal moderate 50% stenoses seen involving the origin and proximal left common carotid artery as above. 4. Extensive atheromatous disease throughout the intracranial circulation with multifocal moderate to severe stenoses as above. Notable findings include a severe stenosis at the supraclinoid left ICA, moderate proximal right M1 and mid left M1 stenoses, with moderate to severe multifocal left V4 stenoses. Both PCAs are heavily diseased with extensive atheromatous irregularity. 5. 2 mm focal outpouching arising from the right PCA as above, suspicious for a small aneurysm. Electronically Signed   By: Rise MuBenjamin  McClintock M.D.   On: 04/19/2019 04:47   CT CHEST W CONTRAST  Result Date: 04/21/2019 CLINICAL DATA:  Unintentional weight loss. EXAM: CT CHEST, ABDOMEN, AND PELVIS WITH CONTRAST TECHNIQUE: Multidetector CT imaging of the chest, abdomen and pelvis was performed following the standard protocol during bolus administration of intravenous contrast. CONTRAST:  100mL OMNIPAQUE IOHEXOL 300 MG/ML  SOLN COMPARISON:  12/27/2015 CT of the pelvis. FINDINGS: CT CHEST FINDINGS Cardiovascular: The heart size is normal. There is no significant pericardial effusion. Coronary artery calcifications are noted. There is no large centrally located pulmonary embolism. Detection of smaller pulmonary emboli is limited by technique. There is no thoracic aortic aneurysm. Thoracic aortic calcifications are noted. Mediastinum/Nodes: --No mediastinal or hilar lymphadenopathy. --No axillary lymphadenopathy. --No supraclavicular lymphadenopathy. --Normal thyroid  gland. --there is diffuse wall thickening of the lower third of the esophagus. There is pneumomediastinum in the upper mediastinum and low right neck. There is fluid tracking  within the fat planes of the upper mediastinum and right lower neck. There may be some overlying skin thickening involving the low right neck. The exact cause of these findings is not clearly identified. Lungs/Pleura: There is scarring and bronchiectasis at the lung bases bilaterally. This has slightly progressed since the prior study. There is no pneumothorax. No large pleural effusion. There is a small ground-glass airspace opacity in the left lung apex measuring approximately 3 mm (axial series 5, image 30). This is felt to be post infectious in etiology. Reticulations bilaterally which have slightly progressed since the prior study. There is questionable honeycombing involving the right middle lobe and right lower lobe. Musculoskeletal: No chest wall abnormality. No acute or significant osseous findings. CT ABDOMEN PELVIS FINDINGS Hepatobiliary: Again identified is a probable portal venous malformation in the left hepatic lobe, similar to prior study. Normal gallbladder.The common bile duct is somewhat dilated. Pancreas: The pancreatic duct is slightly dilated which has progressed since 2017. Spleen: No splenic laceration or hematoma. Adrenals/Urinary Tract: --Adrenal glands: No adrenal hemorrhage. --Right kidney/ureter: The right kidney is slightly atrophic. --Left kidney/ureter: The left kidney is slightly atrophic. There is a nonobstructing stone in the upper pole. There is no hydronephrosis. --Urinary bladder: Unremarkable. Stomach/Bowel: --Stomach/Duodenum: No hiatal hernia or other gastric abnormality. Normal duodenal course and caliber. --Small bowel: No dilatation or inflammation. --Colon: The stool burden is large. --Appendix: Not visualized. No right lower quadrant inflammation or free fluid. Vascular/Lymphatic: Atherosclerotic calcification is present within the non-aneurysmal abdominal aorta, without hemodynamically significant stenosis. --No retroperitoneal lymphadenopathy. --No mesenteric  lymphadenopathy. --No pelvic or inguinal lymphadenopathy. Reproductive: Status post hysterectomy. No adnexal mass. Other: There is a small amount of free fluid in the patient's pelvis. Calcifications are noted in the bilateral gluteal region and bilateral anterior proximal thighs. This may be secondary to prior injections at this location. These have slightly progressed since 2014. Musculoskeletal. There is an old healed fracture involving the left inferior pubic ramus. There is no acute displaced fracture or dislocation on this study. There is a possible developing midline decubitus ulcer inferior to the sacrum. There is evidence for pelvic floor laxity. IMPRESSION: 1. There is diffuse wall thickening of the lower third of the esophagus. Findings may be secondary to esophagitis, however a mass is not excluded. Follow-up with outpatient EGD is recommended. The findings are concerning for esophageal perforation. 2. Free fluid and subcutaneous gas in the upper mediastinum and right neck is likely related to the patient's recent carotid endarterectomy. 3. Slight progression of scarring and bronchiectasis at the lung bases bilaterally. Slight progression of subpleural reticulations bilaterally which may be secondary to chronic interstitial lung disease. 4. Slightly dilated pancreatic and common bile ducts, progressed since 2017. Outpatient follow-up with MRCP or ERCP is recommended for further evaluation. Correlation with laboratory studies is recommended. 5. Nonobstructing left nephrolithiasis. 6. Small amount of free fluid in the patient's pelvis. 7. Large stool burden. 8. Possible developing decubitus ulcer. Correlation with physical exam is recommended. 9. Pelvic floor laxity. Aortic Atherosclerosis (ICD10-I70.0). Electronically Signed   By: Katherine Mantle M.D.   On: 04/21/2019 20:24   MR BRAIN WO CONTRAST  Result Date: 04/18/2019 CLINICAL DATA:  Suspected stroke, left-sided weakness. EXAM: MRI HEAD WITHOUT  CONTRAST TECHNIQUE: Multiplanar, multiecho pulse sequences of the brain and surrounding structures were obtained without intravenous  contrast. COMPARISON:  Head CT 04/18/2019, head CT 08/24/2012 FINDINGS: Brain: Acute infarct within the right corona radiata measuring 0.8 x 1.4 x 1.0 cm (series 3, image 34) (series 4, image 17). Corresponding T2/FLAIR hyperintensity at this site. No evidence of intracranial mass. No midline shift or extra-axial fluid collection. Redemonstrated are multiple chronic lacunar infarcts within the left front lobe periventricular white matter, as well as bilateral basal ganglia and thalami. There is a background of otherwise moderate chronic small vessel ischemic disease. There are several chronic microhemorrhages, some supratentorial as well as within the bilateral dentate nuclei. Vascular: Flow voids maintained within the proximal large arterial vessels. Skull and upper cervical spine: No focal marrow lesion Sinuses/Orbits: Visualized orbits demonstrate no acute abnormality. Minimal ethmoid sinus mucosal thickening. Trace fluid within bilateral mastoid air cells. IMPRESSION: 1.4 cm acute infarct within the right corona radiata. Redemonstrated are multiple chronic lacunar infarcts within the left frontal lobe periventricular white matter, as well as bilateral basal ganglia and thalami. Background of otherwise moderate chronic small vessel ischemic disease. Electronically Signed   By: Jackey Loge DO   On: 04/18/2019 17:32   CT ABDOMEN PELVIS W CONTRAST  Result Date: 04/21/2019 CLINICAL DATA:  Unintentional weight loss. EXAM: CT CHEST, ABDOMEN, AND PELVIS WITH CONTRAST TECHNIQUE: Multidetector CT imaging of the chest, abdomen and pelvis was performed following the standard protocol during bolus administration of intravenous contrast. CONTRAST:  OMNIPAQUE IOHEXOL 300 MG/ML  SOLN COMPARISON:  12/27/2015 CT of the pelvis. FINDINGS: CT CHEST FINDINGS Cardiovascular: The heart size is  normal. There is no significant pericardial effusion. Coronary artery calcifications are noted. There is no large centrally located pulmonary embolism. Detection of smaller pulmonary emboli is limited by technique. There is no thoracic aortic aneurysm. Thoracic aortic calcifications are noted. Mediastinum/Nodes: --No mediastinal or hilar lymphadenopathy. --No axillary lymphadenopathy. --No supraclavicular lymphadenopathy. --Normal thyroid gland. --there is diffuse wall thickening of the lower third of the esophagus. There is pneumomediastinum in the upper mediastinum and low right neck. There is fluid tracking within the fat planes of the upper mediastinum and right lower neck. There may be some overlying skin thickening involving the low right neck. The exact cause of these findings is not clearly identified. Lungs/Pleura: There is scarring and bronchiectasis at the lung bases bilaterally. This has slightly progressed since the prior study. There is no pneumothorax. No large pleural effusion. There is a small ground-glass airspace opacity in the left lung apex measuring approximately 3 mm (axial series 5, image 30). This is felt to be post infectious in etiology. Reticulations bilaterally which have slightly progressed since the prior study. There is questionable honeycombing involving the right middle lobe and right lower lobe. Musculoskeletal: No chest wall abnormality. No acute or significant osseous findings. CT ABDOMEN PELVIS FINDINGS Hepatobiliary: Again identified is a probable portal venous malformation in the left hepatic lobe, similar to prior study. Normal gallbladder.The common bile duct is somewhat dilated. Pancreas: The pancreatic duct is slightly dilated which has progressed since 2017. Spleen: No splenic laceration or hematoma. Adrenals/Urinary Tract: --Adrenal glands: No adrenal hemorrhage. --Right kidney/ureter: The right kidney is slightly atrophic. --Left kidney/ureter: The left kidney is  slightly atrophic. There is a nonobstructing stone in the upper pole. There is no hydronephrosis. --Urinary bladder: Unremarkable. Stomach/Bowel: --Stomach/Duodenum: No hiatal hernia or other gastric abnormality. Normal duodenal course and caliber. --Small bowel: No dilatation or inflammation. --Colon: The stool burden is large. --Appendix: Not visualized. No right lower quadrant inflammation or free fluid. Vascular/Lymphatic: Atherosclerotic  calcification is present within the non-aneurysmal abdominal aorta, without hemodynamically significant stenosis. --No retroperitoneal lymphadenopathy. --No mesenteric lymphadenopathy. --No pelvic or inguinal lymphadenopathy. Reproductive: Status post hysterectomy. No adnexal mass. Other: There is a small amount of free fluid in the patient's pelvis. Calcifications are noted in the bilateral gluteal region and bilateral anterior proximal thighs. This may be secondary to prior injections at this location. These have slightly progressed since 2014. Musculoskeletal. There is an old healed fracture involving the left inferior pubic ramus. There is no acute displaced fracture or dislocation on this study. There is a possible developing midline decubitus ulcer inferior to the sacrum. There is evidence for pelvic floor laxity. IMPRESSION: 1. There is diffuse wall thickening of the lower third of the esophagus. Findings may be secondary to esophagitis, however a mass is not excluded. Follow-up with outpatient EGD is recommended. The findings are concerning for esophageal perforation. 2. Free fluid and subcutaneous gas in the upper mediastinum and right neck is likely related to the patient's recent carotid endarterectomy. 3. Slight progression of scarring and bronchiectasis at the lung bases bilaterally. Slight progression of subpleural reticulations bilaterally which may be secondary to chronic interstitial lung disease. 4. Slightly dilated pancreatic and common bile ducts, progressed  since 2017. Outpatient follow-up with MRCP or ERCP is recommended for further evaluation. Correlation with laboratory studies is recommended. 5. Nonobstructing left nephrolithiasis. 6. Small amount of free fluid in the patient's pelvis. 7. Large stool burden. 8. Possible developing decubitus ulcer. Correlation with physical exam is recommended. 9. Pelvic floor laxity. Aortic Atherosclerosis (ICD10-I70.0). Electronically Signed   By: Katherine Mantle M.D.   On: 04/21/2019 20:24   ECHOCARDIOGRAM COMPLETE  Result Date: 04/19/2019   ECHOCARDIOGRAM REPORT   Patient Name:   Patricia Stanley Date of Exam: 04/19/2019 Medical Rec #:  [id number]        Height:       61.5 in Accession #:    [id number]       Weight:       93.5 lb Date of Birth:  [birth year]         BSA:          1.38 m Patient Age:    73 years         BP:           97/60 mmHg Patient Gender: F                HR:           56 bpm. Exam Location:  Inpatient Procedure: 2D Echo, Cardiac Doppler and Color Doppler Indications:    Stroke 434.91  History:        Patient has no prior history of Echocardiogram examinations.                 Stroke; Risk Factors:Hypertension and Former Smoker.  Sonographer:    Tonia Ghent RDCS Referring Phys: [id number] VASUNDHRA RATHORE IMPRESSIONS  1. Left ventricular ejection fraction, by visual estimation, is 60 to 65%. The left ventricle has normal function. There is no left ventricular hypertrophy.  2. Left ventricular diastolic parameters are consistent with Grade II diastolic dysfunction (pseudonormalization).  3. The left ventricle has no regional wall motion abnormalities.  4. Global right ventricle has normal systolic function.The right ventricular size is normal. No increase in right ventricular wall thickness.  5. Left atrial size was normal.  6. Right atrial size was normal.  7. The mitral valve is normal in structure. No evidence  of mitral valve regurgitation. No evidence of mitral stenosis.  8. The tricuspid valve is  normal in structure. Tricuspid valve regurgitation is trivial.  9. The aortic valve is normal in structure. Aortic valve regurgitation is not visualized. No evidence of aortic valve sclerosis or stenosis. 10. The pulmonic valve was normal in structure. Pulmonic valve regurgitation is not visualized. 11. Normal pulmonary artery systolic pressure. 12. The inferior vena cava is normal in size with greater than 50% respiratory variability, suggesting right atrial pressure of 3 mmHg. FINDINGS  Left Ventricle: Left ventricular ejection fraction, by visual estimation, is 60 to 65%. The left ventricle has normal function. The left ventricle has no regional wall motion abnormalities. There is no left ventricular hypertrophy. Left ventricular diastolic parameters are consistent with Grade II diastolic dysfunction (pseudonormalization). Normal left atrial pressure. Right Ventricle: The right ventricular size is normal. No increase in right ventricular wall thickness. Global RV systolic function is has normal systolic function. The tricuspid regurgitant velocity is 1.74 m/s, and with an assumed right atrial pressure  of 3 mmHg, the estimated right ventricular systolic pressure is normal at 15.1 mmHg. Left Atrium: Left atrial size was normal in size. Right Atrium: Right atrial size was normal in size Pericardium: There is no evidence of pericardial effusion. Mitral Valve: The mitral valve is normal in structure. No evidence of mitral valve regurgitation. No evidence of mitral valve stenosis by observation. Tricuspid Valve: The tricuspid valve is normal in structure. Tricuspid valve regurgitation is trivial. Aortic Valve: The aortic valve is normal in structure. Aortic valve regurgitation is not visualized. The aortic valve is structurally normal, with no evidence of sclerosis or stenosis. Pulmonic Valve: The pulmonic valve was normal in structure. Pulmonic valve regurgitation is not visualized. Pulmonic regurgitation is not  visualized. Aorta: The aortic root, ascending aorta and aortic arch are all structurally normal, with no evidence of dilitation or obstruction. Venous: The inferior vena cava is normal in size with greater than 50% respiratory variability, suggesting right atrial pressure of 3 mmHg. IAS/Shunts: No atrial level shunt detected by color flow Doppler. There is no evidence of a patent foramen ovale. No ventricular septal defect is seen or detected. There is no evidence of an atrial septal defect.  LEFT VENTRICLE PLAX 2D LVIDd:         3.57 cm       Diastology LVIDs:         2.39 cm       LV e' lateral:   8.49 cm/s LV PW:         0.78 cm       LV E/e' lateral: 9.6 LV IVS:        0.78 cm       LV e' medial:    5.98 cm/s LVOT diam:     1.40 cm       LV E/e' medial:  13.6 LV SV:         33 ml LV SV Index:   24.77 LVOT Area:     1.54 cm  LV Volumes (MOD) LV area d, A2C:    21.40 cm LV area d, A4C:    20.40 cm LV area s, A2C:    12.20 cm LV area s, A4C:    11.60 cm LV major d, A2C:   6.47 cm LV major d, A4C:   6.42 cm LV major s, A2C:   5.38 cm LV major s, A4C:   5.45 cm LV vol d, MOD A2C:  58.5 ml LV vol d, MOD A4C: 54.0 ml LV vol s, MOD A2C: 23.1 ml LV vol s, MOD A4C: 21.0 ml LV SV MOD A2C:     35.4 ml LV SV MOD A4C:     54.0 ml LV SV MOD BP:      34.2 ml RIGHT VENTRICLE RV S prime:     12.80 cm/s TAPSE (M-mode): 2.1 cm LEFT ATRIUM             Index       RIGHT ATRIUM          Index LA diam:        2.70 cm 1.96 cm/m  RA Area:     8.86 cm LA Vol (A2C):   24.6 ml 17.89 ml/m RA Volume:   16.30 ml 11.85 ml/m LA Vol (A4C):   37.7 ml 27.41 ml/m LA Biplane Vol: 32.1 ml 23.34 ml/m  AORTIC VALVE LVOT Vmax:   106.00 cm/s LVOT Vmean:  71.200 cm/s LVOT VTI:    0.270 m  AORTA Ao Root diam: 2.70 cm MITRAL VALVE                        TRICUSPID VALVE MV Area (PHT): 2.95 cm             TR Peak grad:   12.1 mmHg MV PHT:        74.53 msec           TR Vmax:        174.00 cm/s MV Decel Time: 257 msec MV E velocity: 81.40 cm/s 103  cm/s  SHUNTS MV A velocity: 63.40 cm/s 70.3 cm/s Systemic VTI:  0.27 m MV E/A ratio:  1.28       1.5       Systemic Diam: 1.40 cm  Rachelle Hora Croitoru MD Electronically signed by Thurmon Fair MD Signature Date/Time: 04/19/2019/3:35:50 PM    Final    VAS US CAROTID  Result Date: 04/23/2019 Carotid Arterial Duplex Study Indications: Stenosis on CTA. Performing Technologist: Levada Schilling RDMS, RVT  Examination Guidelines: A complete evaluation includes B-mode imaging, spectral Doppler, color Doppler, and power Doppler as needed of all accessible portions of each vessel. Bilateral testing is considered an integral part of a complete examination. Limited examinations for reoccurring indications may be performed as noted.  Right Carotid Findings: +----------+-------+--------+--------+--------------------------------+--------+           PSV    EDV cm/sStenosisPlaque Description              Comments           cm/s                                                            +----------+-------+--------+--------+--------------------------------+--------+ CCA Prox  81     19                                                       +----------+-------+--------+--------+--------------------------------+--------+ CCA Mid  diffuse and smooth                       +----------+-------+--------+--------+--------------------------------+--------+ CCA Distal84     24                                                       +----------+-------+--------+--------+--------------------------------+--------+ ICA Prox  166    45      40-59%  calcific, heterogenous and                                                diffuse                                  +----------+-------+--------+--------+--------------------------------+--------+ ICA Mid   161    52      40-59%  heterogenous and diffuse                  +----------+-------+--------+--------+--------------------------------+--------+ ICA Distal74     29                                                       +----------+-------+--------+--------+--------------------------------+--------+ ECA       178    16                                                       +----------+-------+--------+--------+--------------------------------+--------+ +----------+--------+-------+--------+-------------------+           PSV cm/sEDV cmsDescribeArm Pressure (mmHG) +----------+--------+-------+--------+-------------------+ Subclavian115                                        +----------+--------+-------+--------+-------------------+ +---------+--------+--+--------+--+---------+ VertebralPSV cm/s71EDV cm/s24Antegrade +---------+--------+--+--------+--+---------+  Left Carotid Findings: +----------+--------+--------+--------+---------------------+------------------+           PSV cm/sEDV cm/sStenosisPlaque Description   Comments           +----------+--------+--------+--------+---------------------+------------------+ CCA Prox  107     16                                                      +----------+--------+--------+--------+---------------------+------------------+ CCA Mid                                                intimal thickening +----------+--------+--------+--------+---------------------+------------------+ CCA Distal86      18              diffuse, hyperechoic  and smooth                              +----------+--------+--------+--------+---------------------+------------------+ ICA Prox  48      14              heterogenous,                                                             calcific and focal                      +----------+--------+--------+--------+---------------------+------------------+ ICA Distal50      19                                                       +----------+--------+--------+--------+---------------------+------------------+ ECA       147     16              calcific                                +----------+--------+--------+--------+---------------------+------------------+ +----------+--------+--------+--------+-------------------+           PSV cm/sEDV cm/sDescribeArm Pressure (mmHG) +----------+--------+--------+--------+-------------------+ [id number]                                         +----------+--------+--------+--------+-------------------+ +---------+--------+--+--------+--+---------+ VertebralPSV cm/s87EDV cm/s16Antegrade +---------+--------+--+--------+--+---------+  Summary: Right Carotid: Velocities in the right ICA are consistent with a 40-59%                stenosis. IMAGES 27-56 are of the right carotid system. Left Carotid: Velocities in the left ICA are consistent with a 1-39% stenosis.               IMAGES 1-26 are mislabled, 1-26 are images of the left carotid               system!!!. Vertebrals: Bilateral vertebral arteries demonstrate antegrade flow. *See table(s) above for measurements and observations.  Electronically signed by Delia Heady MD on 04/23/2019 at 12:41:49 PM.    Final    DG ESOPHAGUS W SINGLE CM (SOL OR THIN BA)  Result Date: 04/22/2019 CLINICAL DATA:  Pneumomediastinum.  Recent carotid end arterectomy. EXAM: ESOPHOGRAM/BARIUM SWALLOW TECHNIQUE: Single contrast examination was performed using  water-soluble. FLUOROSCOPY TIME:  Fluoroscopy Time:  0 minutes and 39 seconds. Radiation Exposure Index (if provided by the fluoroscopic device): Number of Acquired Spot Images: COMPARISON:  CT chest 04/21/2019 FINDINGS: Patient was placed in a shallow LPO position relative to the fluoro table with fluoro table and 30 degree head up position. She was given sips of water soluble contrast material and monitored fluoroscopically while swallowing.  This reveals a tortuous esophagus without evidence for esophageal leak, diverticulum, or stricture. No esophageal mass lesion evident. Tertiary contractions are noted in the mid and distal esophagus without evidence for presbyesophagus. IMPRESSION: No evidence for esophageal leak. No esophageal mass lesion evident on this single contrast  water-soluble study. Electronically Signed   By: Kennith Center M.D.   On: 04/22/2019 14:22    Microbiology: Recent Results (from the past 240 hour(s))  SARS CORONAVIRUS 2 (TAT 6-24 HRS) Nasopharyngeal Nasopharyngeal Swab     Status: None   Collection Time: 04/18/19  6:17 PM   Specimen: Nasopharyngeal Swab  Result Value Ref Range Status   SARS Coronavirus 2 NEGATIVE NEGATIVE Final    Comment: (NOTE) SARS-CoV-2 target nucleic acids are NOT DETECTED. The SARS-CoV-2 RNA is generally detectable in upper and lower respiratory specimens during the acute phase of infection. Negative results do not preclude SARS-CoV-2 infection, do not rule out co-infections with other pathogens, and should not be used as the sole basis for treatment or other patient management decisions. Negative results must be combined with clinical observations, patient history, and epidemiological information. The expected result is Negative. Fact Sheet for Patients: HairSlick.no Fact Sheet for Healthcare Providers: quierodirigir.com This test is not yet approved or cleared by the [place] FDA and  has been authorized for detection and/or diagnosis of SARS-CoV-2 by FDA under an Emergency Use Authorization (EUA). This EUA will remain  in effect (meaning this test can be used) for the duration of the COVID-19 declaration under Section 56 4(b)(1) of the Act, 21 U.S.C. section 360bbb-3(b)(1), unless the authorization is terminated or revoked sooner. Performed at [facility], [address] [place], [place] [address]     Surgical pcr screen     Status: None   Collection Time: 04/21/19 12:23 AM   Specimen: Nasal Mucosa; Nasal Swab  Result Value Ref Range Status   MRSA, PCR NEGATIVE NEGATIVE Final   Staphylococcus aureus NEGATIVE NEGATIVE Final    Comment: (NOTE) The Xpert SA Assay (FDA approved for NASAL specimens in patients 50 years of age and older), is one component of a comprehensive surveillance program. It is not intended to diagnose infection nor to guide or monitor treatment. Performed at [facility], [address] [place], [place] [address]      Labs: Basic Metabolic Panel: Recent Labs  Lab 04/18/19 1234 04/21/19 0407 04/22/19 0345  NA 144 144 142  K 3.5 3.0* 4.1  CL 114* 115* 115*  CO2 22 20* 19*  GLUCOSE 88 94 95  BUN 15 11 11   CREATININE 1.01* 0.99 0.99  CALCIUM 8.9 8.7* 8.7*   Liver Function Tests: Recent Labs  Lab 04/18/19 1234  AST 21  ALT 13  ALKPHOS 59  BILITOT 0.5  PROT 6.8  ALBUMIN 3.8   No results for input(s): LIPASE, AMYLASE in the last 168 hours. No results for input(s): AMMONIA in the last 168 hours. CBC: Recent Labs  Lab 04/18/19 1234 04/21/19 0407 04/22/19 0345  WBC 3.8* 5.1 8.4  NEUTROABS 1.9 2.2  --   HGB 12.6 11.7* 10.5*  HCT 38.8 35.0* 31.7*  MCV 95.8 94.1 93.8  PLT 120* 120* 113*   Cardiac Enzymes: No results for input(s): CKTOTAL, CKMB, CKMBINDEX, TROPONINI in the last 168 hours. BNP: BNP (last 3 results) No results for input(s): BNP in the last 8760 hours.  ProBNP (last 3 results) No results for input(s): PROBNP in the last 8760 hours.  CBG: No results for input(s): GLUCAP in the last 168 hours.  Principal Problem:   Acute CVA (cerebrovascular accident) (HCC) Active Problems:   Migraines   HTN (hypertension)   Weight loss, unintentional   Anxiety   Time coordinating discharge: 38 minutes.  Signed:  Akaysha Cobern, DO [facility]  04/25/2019, 9:36 AM

## 2019-04-25 NOTE — Progress Notes (Signed)
Physical Therapy Treatment Patient Details Name: Patricia Stanley MRN: [id number] DOB: 27-Apr-1946 Today's Date: 04/25/2019    History of Present Illness Patricia Stanley is a 73 y.o. female with medical history significant of hypertension, migraine headaches, depression, GERD being brought to the hospital via EMS for evaluation of left-sided weakness. In the ER Brain MRI showing a 1.4 cm acute infarct within the right corona radiata.  Pt s/p carotid endarectomy on 04/21/19    PT Comments    Pt demonstrating good improvements.  Able to ambulate 20' with mod A and good participation with balance and target tapping (L LE) exercises.  Continues to need skilled therapy to provide verbal/visual/tactile cues, facilitation, and education on stroke recovery.    Follow Up Recommendations  CIR     Equipment Recommendations  Other (comment)(to be determined next venue)    Recommendations for Other Services Rehab consult     Precautions / Restrictions Precautions Precautions: Fall Precaution Comments: L hemi    Mobility  Bed Mobility Overal bed mobility: Needs Assistance Bed Mobility: Supine to Sit;Sit to Supine Rolling: Min guard   Supine to sit: Min guard Sit to supine: Min guard   General bed mobility comments: used bed rail; performed sit to/from supine x 2  Transfers Overall transfer level: Needs assistance Equipment used: Rolling walker (2 wheeled) Transfers: Sit to/from Stand Sit to Stand: Min assist  [place] ADLs: min A and RW for balance (spouse assisted with ADLs)         General transfer comment: performed sit to stand x 5: cues for hand placement, foot placement and focus on using both legs equally  Ambulation/Gait Ambulation/Gait assistance: Mod assist Gait Distance (Feet): 20 Feet Assistive device: Rolling walker (2 wheeled) Gait Pattern/deviations: Step-to pattern;Scissoring;Ataxic Gait velocity: reduced   General Gait Details: pt with short step to gait  with inconsistent placement of L foot, intermittent scissoring. PT providing verbal and visual cues to increase width of base of support   Stairs             Wheelchair Mobility    Modified Rankin (Stroke Patients Only)       Balance Overall balance assessment: Needs assistance Sitting-balance support: Single extremity supported;Feet supported Sitting balance-Leahy Scale: Good     Standing balance support: Bilateral upper extremity supported;During functional activity Standing balance-Leahy Scale: Poor               High level balance activites: Side stepping;Other (comment) High Level Balance Comments:  Performed:  -standing target tapping with L LE 10x2: cues to control L LE and gown held so pt could see L LE -Side stepping 5' both directions x 2: min-mod A, cues for L LE and to move RW -rest break after each activity            Cognition Arousal/Alertness: Awake/alert Behavior During Therapy: WFL for tasks assessed/performed Overall Cognitive Status: Impaired/Different from baseline Area of Impairment: Memory;Following commands;Problem solving                     Memory: Decreased short-term memory Following Commands: Follows one step commands with increased time;Follows multi-step commands inconsistently     Problem Solving: Slow processing;Decreased initiation;Requires verbal cues        Exercises      General Comments General comments (skin integrity, edema, etc.): VSS      Pertinent Vitals/Pain Pain Assessment: No/denies pain    Home Living  Prior Function            PT Goals (current goals can now be found in the care plan section) Progress towards PT goals: Progressing toward goals    Frequency    Min 4X/week      PT Plan Current plan remains appropriate    Co-evaluation              AM-PAC PT "6 Clicks" Mobility   Outcome Measure  Help needed turning from your back to your  side while in a flat bed without using bedrails?: None Help needed moving from lying on your back to sitting on the side of a flat bed without using bedrails?: None Help needed moving to and from a bed to a chair (including a wheelchair)?: A Little Help needed standing up from a chair using your arms (e.g., wheelchair or bedside chair)?: A Little Help needed to walk in hospital room?: A Lot Help needed climbing 3-5 steps with a railing? : A Lot 6 Click Score: 18    End of Session Equipment Utilized During Treatment: Gait belt Activity Tolerance: Patient tolerated treatment well Patient left: in bed;with call bell/phone within reach;with bed alarm set;with family/visitor present Nurse Communication: Mobility status PT Visit Diagnosis: Muscle weakness (generalized) (M62.81);Other abnormalities of gait and mobility (R26.89);Difficulty in walking, not elsewhere classified (R26.2);Hemiplegia and hemiparesis;Unsteadiness on feet (R26.81)     Time: 1455-1530 PT Time Calculation (min) (ACUTE ONLY): 35 min  Charges:  $Gait Training: 8-22 mins $Neuromuscular Re-education: 8-22 mins                     Royetta Asal, PT Acute Rehab Services Pager [phone] [facility] [phone] [facility] [phone]    Rayetta Humphrey 04/25/2019, 4:32 PM

## 2019-04-25 NOTE — Progress Notes (Signed)
Inpatient Rehabilitation Admissions Coordinator  I have insurance approval and bed to admit pt to CIR today. I met with patient and her spouse at bedside and they are in agreement. I spoke with Dr. Benny Lennert at bedside and RN CM , Manor aware. I will make the arrangements.  Danne Baxter, RN, MSN Rehab Admissions Coordinator [phone] 04/25/2019 10:42 AM

## 2019-04-25 NOTE — Progress Notes (Signed)
Standley BrookingBoyette, Mercer Stallworth G, RN  Rehab Admission Coordinator  Physical Medicine and Rehabilitation  PMR Pre-admission  Signed  Date of Service:  04/25/2019 12:33 PM      Related encounter: ED [facility]      Signed        Show:Clear all [x] Manual[x] Template[x] Copied  Added by: [x] Standley BrookingBoyette, Nimrit Kehres G, RN  [] Hover for details PMR Admission Coordinator Pre-Admission Assessment   Patient: Patricia SaltsMartha J Stanley is an 73 y.o., female MRN: [id number] DOB: 11/18/1945 Height: 5' 1.5" (156.2 cm) Weight: 41.5 kg                                                                                                                                                  Insurance Information HMO: yes    PPO:      PCP:      IPA:      80/20:      OTHER:  PRIMARY: [employer]      Policy#: [id number]      Subscriber: pt CM Name: Chanetta MarshallKiera      Phone#: [phone]     Fax#: [phone] Pre-Cert#: [id number] approved for 7 days with f/u with Montel ClockKierra      Employer:  Benefits:  Phone #: [phone]     Name: 12/21 Eff. Date: 05/05/2017     Deduct: none      Out of Pocket Max: $3400      Life Max: none CIR: $295 co pay per day days 1 until 6      SNF: no co pay days 1 until 20: $178 per day days 21 until 100 Outpatient: $10 to $40 per visit     Co-Pay: visits per medical neccesity Home Health: 100%      Co-Pay: visits per medical neccesity DME: 80%     Co-Pay: 20% Providers: in network  SECONDARY: none         Medicaid Application Date:       Case Manager:  Disability Application Date:       Case Worker:    The "Data Collection Information Summary" for patients in Inpatient Rehabilitation Facilities with attached "Privacy Act Statement-Health Care Records" was provided and verbally reviewed with: Patient and Family   Emergency Contact Information         Contact Information     Name Relation Home Work Mobile    [place] Ridge[place]REG[place],LARRY Spouse  [phone]   [phone]       Current Medical History  Patient Admitting Diagnosis: CVA   History of Present Illness: 73 year old right-handed female with history of hypertension, depression, migraine headaches and quit smoking 23 years ago.  Patient presented 04/18/2019 with left-sided weakness x3 days with difficulty in speech and facial droop.  Noted blood pressure systolic in the 180s.  Cranial CT scan  negative.  Patient did not receive TPA.  MRI of the brain showed a 1.4 cm acute infarction within the right corona radiata in addition to multiple chronic lacunar infarcts within the left frontal lobe periventricular white matter as well as bilateral basal ganglia and thalami.  CTA of head and neck with extensive plaque right ICA with 65 to 70% stenosis as well as carotid Dopplers with right ICA 40 to 59% stenosis..  Patient underwent emergent right carotid enterectomy per vascular surgery Dr. Chestine Spore 04/21/2019.  Neurology services follow-up and maintained on aspirin 325 mg as well as Plavix x3 months then aspirin alone.  Patient is also maintained on Lovenox for DVT prophylaxis.  Echocardiogram with ejection fraction of 65% without emboli.  Gastroenterology consulted 04/22/2019 for history of unintentional weight loss with CT of the abdomen showing no definite malignancy but did show pneumomediastinum and fluid in the upper mediastinum and lower right neck consistent with postsurgical changes but in addition, did show distal esophageal thickening and raising the question of esophagitis or even possibly a mass.  Patient did have an endoscopy and colonoscopy 3-1/2 years ago by Dr. Laural Benes that showed endoscopy normal specifically without evidence of any esophagitis and with normal duodenal biopsies.  The colonoscopy was also reportedly negative.  Patient underwent esophagram/barium swallow showing no evidence of esophageal leak.  No esophageal mass lesion evident.    Complete NIHSS TOTAL: 2 Glasgow Coma  Scale Score: 15   Past Medical History      Past Medical History:  Diagnosis Date  . Closed fracture of coccyx (HCC) 2015    AFTER FALL  . Depression    . GERD (gastroesophageal reflux disease)    . Headache      MIGRAINES OCCASIONAL  . History of kidney stones      HAS NOW  . Hypertension        Family History  family history is not on file.   Prior Rehab/Hospitalizations:  Has the patient had prior rehab or hospitalizations prior to admission? Yes   Has the patient had major surgery during 100 days prior to admission? Yes   Current Medications    Current Facility-Administered Medications:  .  0.9 %  sodium chloride infusion, 500 mL, Intravenous, Once PRN, Setzer, Lynnell Jude, PA-C .  acetaminophen (TYLENOL) tablet 650 mg, 650 mg, Oral, Q4H PRN, 650 mg at 04/24/19 0751 **OR** acetaminophen (TYLENOL) 160 MG/5ML solution 650 mg, 650 mg, Per Tube, Q4H PRN **OR** acetaminophen (TYLENOL) suppository 650 mg, 650 mg, Rectal, Q4H PRN, Setzer, Lynnell Jude, PA-C .  alum & mag hydroxide-simeth (MAALOX/MYLANTA) 200-200-20 MG/5ML suspension 15-30 mL, 15-30 mL, Oral, Q2H PRN, Setzer, Lynnell Jude, PA-C .  aspirin suppository 300 mg, 300 mg, Rectal, Daily **OR** aspirin tablet 325 mg, 325 mg, Oral, Daily, Setzer, Lynnell Jude, PA-C, 325 mg at 04/25/19 4098 .  atenolol (TENORMIN) tablet 50 mg, 50 mg, Oral, QPM, Setzer, Lynnell Jude, PA-C, 50 mg at 04/24/19 1739 .  atorvastatin (LIPITOR) tablet 80 mg, 80 mg, Oral, q1800, Milinda Antis, PA-C, 80 mg at 04/24/19 1738 .  bisacodyl (DULCOLAX) suppository 10 mg, 10 mg, Rectal, Once, Swayze, Ava, DO .  clonazePAM (KLONOPIN) tablet 1 mg, 1 mg, Oral, q morning - 10a, Setzer, Lynnell Jude, PA-C, 1 mg at 04/25/19 1191 .  clonazePAM (KLONOPIN) tablet 2 mg, 2 mg, Oral, QHS, Setzer, Lynnell Jude, PA-C, 2 mg at 04/24/19 2103 .  clopidogrel (PLAVIX) tablet 75 mg, 75 mg, Oral, Daily, Setzer, Lynnell Jude, PA-C, 75 mg  at 04/25/19 0917 .  docusate sodium (COLACE) capsule 100 mg, 100 mg,  Oral, Daily, Milinda Antis, PA-C, 100 mg at 04/25/19 9417 .  enoxaparin (LOVENOX) injection 30 mg, 30 mg, Subcutaneous, Q24H, Setzer, Lynnell Jude, PA-C, 30 mg at 04/25/19 0919 .  guaiFENesin-dextromethorphan (ROBITUSSIN DM) 100-10 MG/5ML syrup 15 mL, 15 mL, Oral, Q4H PRN, Setzer, Lynnell Jude, PA-C .  hydrALAZINE (APRESOLINE) injection 5 mg, 5 mg, Intravenous, Q4H PRN, Setzer, Lynnell Jude, PA-C, 5 mg at 04/21/19 1033 .  hydrALAZINE (APRESOLINE) tablet 25 mg, 25 mg, Oral, Q6H, Swayze, Ava, DO, 25 mg at 04/25/19 1142 .  HYDROcodone-acetaminophen (NORCO/VICODIN) 5-325 MG per tablet 1-2 tablet, 1-2 tablet, Oral, Q4H PRN, Cephus Shelling, MD, 2 tablet at 04/25/19 [id number] .  hydrocortisone cream 1 %, , Topical, TID PRN, Macon Large, NP, 1 application at 04/24/19 2207 .  HYDROmorphone (DILAUDID) injection 0.5 mg, 0.5 mg, Intravenous, Q3H PRN, Cephus Shelling, MD, 0.5 mg at 04/23/19 2129 .  iohexol (OMNIPAQUE) 300 MG/ML solution 50 mL, 50 mL, Oral, Once PRN, Buccini, Robert, MD .  labetalol (NORMODYNE) injection 10 mg, 10 mg, Intravenous, Q10 min PRN, Milinda Antis, PA-C, 10 mg at 04/23/19 2129 .  lisinopril (ZESTRIL) tablet 10 mg, 10 mg, Oral, Daily, Swayze, Ava, DO, 10 mg at 04/25/19 0918 .  magnesium sulfate IVPB 2 g 50 mL, 2 g, Intravenous, Daily PRN, Setzer, Lynnell Jude, PA-C .  metoprolol tartrate (LOPRESSOR) injection 2-5 mg, 2-5 mg, Intravenous, Q2H PRN, Setzer, Lynnell Jude, PA-C .  ondansetron [facility]) injection 4 mg, 4 mg, Intravenous, Q6H PRN, Milinda Antis, PA-C, 4 mg at 04/21/19 1531 .  pantoprazole (PROTONIX) EC tablet 40 mg, 40 mg, Oral, BID AC, Buccini, Robert, MD, 40 mg at 04/25/19 0917 .  phenol (CHLORASEPTIC) mouth spray 1 spray, 1 spray, Mouth/Throat, PRN, Setzer, Lynnell Jude, PA-C .  polyethylene glycol (MIRALAX / GLYCOLAX) packet 17 g, 17 g, Oral, Daily PRN, Swayze, Ava, DO, 17 g at 04/24/19 2104   Patients Current Diet:     Diet Order                      Diet - low  sodium heart healthy           Diet Heart Room service appropriate? Yes with Assist; Fluid consistency: Thin  Diet effective now                   Precautions / Restrictions Precautions Precautions: Fall Precaution Comments: L hemi Restrictions Weight Bearing Restrictions: No    Has the patient had 2 or more falls or a fall with injury in the past year?No   Prior Activity Level Household: gradual decline over  past year; very sedentary   Prior Functional Level Prior Function Level of Independence: Needs assistance Gait / Transfers Assistance Needed: did not use AD; used walls in home ot hold onto ADL's / Homemaking Assistance Needed: family assist pt into shower and on the chair,  Communication / Swallowing Assistance Needed: spouse states her mind isn't as fast as it used to be, she's had a hard time explaining things to me Comments: spouse states she hasn't been out of the house in a  year   Self Care: Did the patient need help bathing, dressing, using the toilet or eating?  Needed some help   Indoor Mobility: Did the patient need assistance with walking from room to room (with or without device)? Needed some help  Stairs: Did the patient need assistance with internal or external stairs (with or without device)? Needed some help   Functional Cognition: Did the patient need help planning regular tasks such as shopping or remembering to take medications? Needed some help   Home Assistive Devices / [place] Devices/Equipment: None Home Equipment: Walker - 2 wheels, Bedside commode, Shower seat, Grab bars - tub/shower, Hand held shower head   Prior Device Use: Indicate devices/aids used by the patient prior to current illness, exacerbation or injury? None of the above   Current Functional Level Cognition   Arousal/Alertness: Awake/alert Overall Cognitive Status: Impaired/Different from baseline Orientation Level: Oriented X4 Following Commands: Follows  one step commands with increased time, Follows multi-step commands inconsistently Safety/Judgement: Decreased awareness of safety, Decreased awareness of deficits General Comments: pt slow to process overall, unable to recall room number stated at start of session but was able to recall date at end of session. Attention: Focused, Sustained Focused Attention: Appears intact Sustained Attention: Impaired Sustained Attention Impairment: Verbal basic, Functional basic Memory: Impaired Memory Impairment: Storage deficit, Retrieval deficit, Decreased recall of new information, Decreased short term memory Decreased Short Term Memory: Verbal basic, Functional basic Awareness: Impaired Awareness Impairment: Intellectual impairment Problem Solving: Impaired Problem Solving Impairment: Verbal basic, Functional basic Executive Function: Organizing, Self Monitoring Organizing: Impaired Self Monitoring: Impaired Safety/Judgment: Impaired    Extremity Assessment (includes Sensation/Coordination)   Upper Extremity Assessment: LUE deficits/detail LUE Deficits / Details: grip 2/5, elbow flexion 3-/5, shld flexion with 2+/5, sensation deficit LUE Sensation: decreased light touch, decreased proprioception LUE Coordination: decreased gross motor, decreased fine motor  Lower Extremity Assessment: LLE deficits/detail LLE Deficits / Details: grossly 3-/5, decreased sensation LLE Sensation: decreased light touch     ADLs   Overall ADL's : Needs assistance/impaired Eating/Feeding: Set up, Sitting Eating/Feeding Details (indicate cue type and reason): assist cutting up food, opening containers as patient is L hand dominant and having to use mainly R hand for self care tasks Grooming: Set up, Sitting Grooming Details (indicate cue type and reason): apply lotion to L hand, set- up assist , able to open can with R hand using L hand as stabilizer, supervision for safety not to spill Upper Body Bathing: Sitting,  Moderate assistance Upper Body Bathing Details (indicate cue type and reason): due to deficits in L UE Lower Body Bathing: Sitting/lateral leans, Total assistance Lower Body Bathing Details (indicate cue type and reason): decreased trunk control Upper Body Dressing : Moderate assistance, Sitting Upper Body Dressing Details (indicate cue type and reason): due to limited trunk control and patient limited use of dominant hand Lower Body Dressing: Total assistance, Sitting/lateral leans Lower Body Dressing Details (indicate cue type and reason): limited trunk control, limited use of L UE Toilet Transfer: Minimal assistance, Moderate assistance, RW, Ambulation Toilet Transfer Details (indicate cue type and reason): simulated via functional mobility, MOD A for balance as pt leans to L and scissors L leg needing cues to self- correct Toileting- Clothing Manipulation and Hygiene: Total assistance, Sitting/lateral lean Functional mobility during ADLs: Moderate assistance, Cueing for safety, Rolling walker General ADL Comments: session focus on functional mobility and seated therex to facilitate LUE ROM for BADL participation     Mobility   Overal bed mobility: Needs Assistance Bed Mobility: Supine to Sit, Sit to Supine Rolling: Min guard Sidelying to sit: Min assist Supine to sit: Min assist Sit to supine: Min assist General bed mobility comments: OOB in recliner     Transfers  Overall transfer level: Needs assistance Equipment used: Rolling walker (2 wheeled) Transfers: Sit to/from Stand Sit to Stand: Min assist Stand pivot transfers: Mod assist General transfer comment: MIN A sit>stand, cues for hand placement     Ambulation / Gait / Stairs / Wheelchair Mobility   Ambulation/Gait Ambulation/Gait assistance: Mod assist Gait Distance (Feet): 8 Feet(3 trials of 8') Assistive device: Rolling walker (2 wheeled) Gait Pattern/deviations: Step-to pattern, Scissoring General Gait Details: pt  with short step to gait with inconsistent placement of L foot, intermittent scissoring. PT providing verbal and visual cues to increase width of base of support Gait velocity: reduced Gait velocity interpretation: <1.31 ft/sec, indicative of household ambulator     Posture / Balance Dynamic Sitting Balance Sitting balance - Comments: supervision Balance Overall balance assessment: Needs assistance Sitting-balance support: Single extremity supported, Feet supported Sitting balance-Leahy Scale: Good Sitting balance - Comments: supervision Postural control: Right lateral lean, Posterior lean Standing balance support: Bilateral upper extremity supported Standing balance-Leahy Scale: Poor Standing balance comment: modA with posterior lean at times     Special needs/care consideration BiPAP/CPAP CPM Continuous Drip IV Dialysis      Life Vest Oxygen Special Bed Trach Size Wound Vac  Skin surgical incision Bowel mgmt:LBM 12/17 continent Bladder mgmt:external catheter Diabetic mgmt Hgb A1c 5.8 Behavioral consideration  chemo/radiation  Designated visitor is spouse Peyton Najjar    Previous Home Environment  Living Arrangements: Spouse/significant other, Children(daughter lives with parents)  Lives With: Spouse, Daughter Available Help at Discharge: Family, Available 24 hours/day Type of Home: House Home Layout: One level Home Access: Stairs to enter Entrance Stairs-Rails: Can reach both Entrance Stairs-Number of Steps: 4 Bathroom Shower/Tub: Engineer, manufacturing systems: Standard Bathroom Accessibility: Yes How Accessible: Accessible via walker Home Care Services: No   Discharge Living Setting Plans for Discharge Living Setting: Patient's home, Lives with (comment)(spouse and daughter) Type of Home at Discharge: House Discharge Home Layout: One level Discharge Home Access: Stairs to enter Entrance Stairs-Rails: Right, Left, Can reach both Entrance Stairs-Number of Steps:  4 Discharge Bathroom Shower/Tub: Tub/shower unit Discharge Bathroom Toilet: Standard Discharge Bathroom Accessibility: Yes How Accessible: Accessible via walker Does the patient have any problems obtaining your medications?: No   Social/Family/Support Systems Patient Roles: Spouse, Parent Contact Information: spouse, Peyton Najjar Anticipated Caregiver: spouse and daughters Anticipated Caregiver's Contact Information: [phone] Ability/Limitations of Caregiver: spouse works, but he and daughter can work out to provide 24/7 supervision Caregiver Availability: 24/7 Discharge Plan Discussed with Primary Caregiver: Yes Is Caregiver In Agreement with Plan?: Yes Does Caregiver/Family have Issues with Lodging/Transportation while Pt is in Rehab?: No   Goals/Additional Needs Patient/Family Goal for Rehab: supervision PT and SLP, supervision to min assist OT Expected length of stay: ELOS 10 to 14 days Pt/Family Agrees to Admission and willing to participate: Yes Program Orientation Provided & Reviewed with Pt/Caregiver Including Roles  & Responsibilities: Yes   Decrease burden of Care through IP rehab admission:    Possible need for SNF placement upon discharge:     Patient Condition: This patient's medical and functional status has changed since the consult dated: 04/19/2019 in which the Rehabilitation Physician determined and documented that the patient's condition is appropriate for intensive rehabilitative care in an inpatient rehabilitation facility. See "History of Present Illness" (above) for medical update. Functional changes are: mod assist. Patient's medical and functional status update has been discussed with the Rehabilitation physician and patient remains appropriate for inpatient rehabilitation. Will admit to inpatient rehab today.   Preadmission Screen Completed  By:  Clois Dupes, RN, 04/25/2019 12:34  PM ______________________________________________________________________   Discussed status with Dr. Carlis Abbott on 04/25/2019 at  1245 and received approval for admission today.   Admission Coordinator:  Clois Dupes, time 4098 Date 04/25/2019         Cosigned by: Horton Chin, MD at 04/25/2019  1:15 PM  Revision History

## 2019-04-25 NOTE — Progress Notes (Signed)
Ranelle OysterSwartz, Zachary T, MD  Physician  Physical Medicine and Rehabilitation  Consult Note  Signed  Date of Service:  04/19/2019  1:16 PM      Related encounter: ED to Hosp-Admission (Current) from [facility] SURGICAL PROGRESSIVE CARE      Signed      Expand AllCollapse All   Show:Clear all [x] Manual[x] Template[] Copied  Added by: [x] Angiulli, Mcarthur Rossettianiel J, PA-C[x] Ranelle OysterSwartz, Zachary T, MD  [] Hover for details          Physical Medicine and Rehabilitation Consult Reason for Consult: Left side weakness Referring Physician: [facility]     HPI: Patricia Stanley is a 73 y.o. right-handed female with history of hypertension, migraine headaches and quit smoking 23 years ago.  Per chart review lives with spouse.  1 level home 4 steps to entry.  By report patient ambulatory in the home but has not left the house in over a year and was very sedentary as well as report of significant weight loss over the past year.  Patient presented 04/18/2019 with left-sided weakness x3 days with difficulty in speech and facial droop.  Noted blood pressure systolic in the 180s.  Cranial CT scan negative.  Patient did not receive TPA.  MRI showed a 1.4 cm acute infarct within the right corona radiata in addition to multiple chronic lacunar infarcts within the left frontal lobe periventricular white matter as well as bilateral basal ganglia and thalami.  Neurology felt stroke was d/t small vessel disease. Echocardiogram just complete.  CT angiogram of head and neck negative for large vessel occlusion.  Admission chemistries with creatinine 1.01, SARS coronavirus negative, alcohol level negative, WBC 3.8.  Neurology follow-up currently on aspirin Plavix for CVA prophylaxis.  Subcutaneous Lovenox for DVT prophylaxis.  Tolerating a regular diet.  Therapy evaluations completed with recommendations of physical medicine rehab consult.     Review of Systems  Constitutional: Positive for weight loss. Negative for fever.    HENT: Negative for hearing loss.   Eyes: Negative for blurred vision and double vision.  Respiratory: Negative for cough and shortness of breath.   Cardiovascular: Negative for chest pain and leg swelling.  Gastrointestinal: Positive for constipation. Negative for heartburn.       GERD  Genitourinary: Negative for dysuria, flank pain and hematuria.  Musculoskeletal: Positive for joint pain and myalgias.  Skin: Negative for rash.  Neurological: Positive for headaches.  Psychiatric/Behavioral: Positive for depression. The patient has insomnia.   All other systems reviewed and are negative.       Past Medical History:  Diagnosis Date  . Closed fracture of coccyx (HCC) 2015    AFTER FALL  . Depression    . GERD (gastroesophageal reflux disease)    . Headache      MIGRAINES OCCASIONAL  . History of kidney stones      HAS NOW  . Hypertension           Past Surgical History:  Procedure Laterality Date  . ABDOMINAL HYSTERECTOMY        1 OVARY LEFT  . APPENDECTOMY   AGE 80    DONE WITH OVARY REMOVAL  . COLONOSCOPY WITH PROPOFOL N/A 12/03/2015    Procedure: COLONOSCOPY WITH PROPOFOL;  Surgeon: Charolett BumpersMartin K Johnson, MD;  Location: [facility];  Service: Endoscopy;  Laterality: N/A;  . ESOPHAGOGASTRODUODENOSCOPY (EGD) WITH PROPOFOL N/A 12/03/2015    Procedure: ESOPHAGOGASTRODUODENOSCOPY (EGD) WITH PROPOFOL;  Surgeon: Charolett BumpersMartin K Johnson, MD;  Location: [facility];  Service: Endoscopy;  Laterality:  N/A;  . TONSILLECTOMY   AGE 6    AND ADENOIDS REMOVED    History reviewed. No pertinent family history. Social History:  reports that she quit smoking about 23 years ago. She has never used smokeless tobacco. She reports that she does not drink alcohol or use drugs. Allergies:       Allergies  Allergen Reactions  . Paroxetine Other (See Comments)      Shakes-tremors    Medications Prior to Admission  Medication Sig Dispense Refill  . atenolol (TENORMIN) 50 MG tablet Take 50 mg by mouth  every evening.       . clonazePAM (KLONOPIN) 1 MG tablet Take 1 tablet (1 mg total) by mouth 2 (two) times daily. (Patient taking differently: Take 1-2 mg by mouth See admin instructions. Take 1 tablet in the morning, and 2 tablets at bedtime) 30 tablet 1  . cloNIDine (CATAPRES) 0.3 MG tablet Take 0.3 mg by mouth daily.      . ondansetron (ZOFRAN) 4 MG tablet Take 1 tablet (4 mg total) by mouth every 6 (six) hours. (Patient taking differently: Take 4 mg by mouth every 8 (eight) hours as needed for nausea or vomiting. ) 12 tablet 0  . simvastatin (ZOCOR) 40 MG tablet Take 40 mg by mouth every evening.      . topiramate (TOPAMAX) 100 MG tablet Take 150 mg by mouth at bedtime. For headaches          Home: Home Living Family/patient expects to be discharged to:: Private residence Living Arrangements: Spouse/significant other Available Help at Discharge: Family, Available 24 hours/day Type of Home: House Home Access: Stairs to enter [employer] of Steps: 4 Entrance Stairs-Rails: Can reach both Home Layout: One level Bathroom Shower/Tub: Engineer, manufacturing systems: Standard Home Equipment: Environmental consultant - 2 wheels, Bedside commode, Shower seat, Grab bars - tub/shower, Hand held shower head  Functional History: Prior Function Level of Independence: Needs assistance Gait / Transfers Assistance Needed: didn't use AD ADL's / Homemaking Assistance Needed: family assist pt into shower and on the chair,  Communication / Swallowing Assistance Needed: spouse states her mind isn't as fast as it used to be, she's had a hard time explaining things to me Comments: spouse states she hasn't been out of the house in a  year Functional Status:  Mobility: Bed Mobility Overal bed mobility: Needs Assistance Bed Mobility: Rolling, Sidelying to Sit Rolling: Max assist Sidelying to sit: Max assist General bed mobility comments: max directional verbal cues, pt with no initiation, required max verbal  and tactile cues, maxA for trunk elevation, pt with R lateral posterior lean Transfers Overall transfer level: Needs assistance Equipment used: 1 person hand held assist Transfers: Sit to/from Stand, Stand Pivot Transfers Sit to Stand: Max assist Stand pivot transfers: Max assist General transfer comment: pt with L knee buckling, unable to grip PTs hand with L UE, pt did attempted to sequence steps to std pvt to chair however required maxA to prevent fall, pt unable to clear L foot, dragged it to chair Ambulation/Gait General Gait Details: unable this date   ADL: ADL Overall ADL's : Needs assistance/impaired Eating/Feeding: Set up, Sitting Eating/Feeding Details (indicate cue type and reason): assist cutting up food, opening containers as patient is L hand dominant and having to use mainly R hand for self care tasks Grooming: Oral care, Wash/dry face, Sitting, Minimal assistance Grooming Details (indicate cue type and reason): set up initially, min A for trunk control and to correct posture  in order to spit into basin Upper Body Bathing: Sitting, Moderate assistance Upper Body Bathing Details (indicate cue type and reason): due to deficits in L UE Lower Body Bathing: Sitting/lateral leans, Total assistance Lower Body Bathing Details (indicate cue type and reason): decreased trunk control Upper Body Dressing : Moderate assistance, Sitting Upper Body Dressing Details (indicate cue type and reason): due to limited trunk control and patient limited use of dominant hand Lower Body Dressing: Total assistance, Sitting/lateral leans Lower Body Dressing Details (indicate cue type and reason): limited trunk control, limited use of L UE Toilet Transfer: +2 for physical assistance, +2 for safety/equipment, Stand-pivot, BSC Toilet Transfer Details (indicate cue type and reason): did not attempt this session as patient's breakfast arrived, anticipate assist of 2 for transfers for safety as patient has  limited core stabilization  Toileting- Clothing Manipulation and Hygiene: Total assistance, Sitting/lateral lean General ADL Comments: min A for UB ADL sitting supported in seat, difficulty with trunk control   Cognition: Cognition Overall Cognitive Status: Impaired/Different from baseline Orientation Level: Oriented X4 Cognition Arousal/Alertness: Awake/alert Behavior During Therapy: WFL for tasks assessed/performed Overall Cognitive Status: Impaired/Different from baseline Area of Impairment: Following commands, Safety/judgement, Awareness, Problem solving Following Commands: Follows one step commands with increased time, Follows one step commands consistently, Follows multi-step commands inconsistently Safety/Judgement: Decreased awareness of safety, Decreased awareness of deficits Awareness: Intellectual Problem Solving: Slow processing, Decreased initiation, Difficulty sequencing, Requires verbal cues, Requires tactile cues   Blood pressure (!) 95/56, pulse 64, temperature 97.6 F (36.4 C), temperature source Oral, resp. rate 17, height 5' 1.5" (1.562 m), weight 42.4 kg, SpO2 100 %. Physical Exam  Constitutional: She appears well-developed. No distress.  HENT:  Head: Normocephalic.  Eyes: EOM are normal.  Neck: No thyromegaly present.  Cardiovascular: Normal rate.  Respiratory: Effort normal.  GI: Soft.  Musculoskeletal:     Cervical back: Normal range of motion.  Neurological:  Patient is alert resting comfortably with husband at bedside.  She does make eye contact and appropriate to provide her name and age. Left central 7. RUE and RLE 5/5.  Left HP 3-4/5. With [facility]. Senses pain in all 4.   Skin: Skin is warm.      Lab Results Last 24 Hours       Results for orders placed or performed during the hospital encounter of 04/18/19 (from the past 24 hour(s))  Urinalysis, Routine w reflex microscopic     Status: Abnormal    Collection Time: 04/18/19  6:07 PM  Result Value  Ref Range    Color, Urine YELLOW YELLOW    APPearance CLOUDY (A) CLEAR    Specific Gravity, Urine 1.013 1.005 - 1.030    pH 7.0 5.0 - 8.0    Glucose, UA NEGATIVE NEGATIVE mg/dL    Hgb urine dipstick NEGATIVE NEGATIVE    Bilirubin Urine NEGATIVE NEGATIVE    Ketones, ur 5 (A) NEGATIVE mg/dL    Protein, ur NEGATIVE NEGATIVE mg/dL    Nitrite NEGATIVE NEGATIVE    Leukocytes,Ua NEGATIVE NEGATIVE  SARS CORONAVIRUS 2 (TAT 6-24 HRS) Nasopharyngeal Nasopharyngeal Swab     Status: None    Collection Time: 04/18/19  6:17 PM    Specimen: Nasopharyngeal Swab  Result Value Ref Range    SARS Coronavirus 2 NEGATIVE NEGATIVE  Hemoglobin A1c     Status: Abnormal    Collection Time: 04/19/19  4:14 AM  Result Value Ref Range    Hgb A1c MFr Bld 5.8 (H) 4.8 - 5.6 %  Mean Plasma Glucose 119.76 mg/dL  Lipid panel     Status: None    Collection Time: 04/19/19  4:14 AM  Result Value Ref Range    Cholesterol 152 0 - 200 mg/dL    Triglycerides 58 <098 mg/dL    HDL 53 >11 mg/dL    Total CHOL/HDL Ratio 2.9 RATIO    VLDL 12 0 - 40 mg/dL    LDL Cholesterol 87 0 - 99 mg/dL  TSH     Status: None    Collection Time: 04/19/19  4:14 AM  Result Value Ref Range    TSH 1.184 0.350 - 4.500 uIU/mL  Sedimentation rate     Status: None    Collection Time: 04/19/19  4:14 AM  Result Value Ref Range    Sed Rate 7 0 - 22 mm/hr  C-reactive protein     Status: None    Collection Time: 04/19/19  4:14 AM  Result Value Ref Range    CRP 0.6 <1.0 mg/dL  HIV Antibody (routine testing w rflx)     Status: None    Collection Time: 04/19/19  4:14 AM  Result Value Ref Range    HIV Screen 4th Generation wRfx NON REACTIVE NON REACTIVE       Imaging Results (Last 48 hours)  CT ANGIO HEAD W OR WO CONTRAST   Result Date: 04/19/2019 CLINICAL DATA:  Initial evaluation for acute stroke. EXAM: CT ANGIOGRAPHY HEAD AND NECK TECHNIQUE: Multidetector CT imaging of the head and neck was performed using the standard protocol during  bolus administration of intravenous contrast. Multiplanar CT image reconstructions and MIPs were obtained to evaluate the vascular anatomy. Carotid stenosis measurements (when applicable) are obtained utilizing NASCET criteria, using the distal internal carotid diameter as the denominator. CONTRAST:  80mL OMNIPAQUE IOHEXOL 350 MG/ML SOLN COMPARISON:  Prior CT and MRI from 04/18/2019. FINDINGS: CTA NECK FINDINGS Aortic arch: Visualized aortic arch of normal caliber with normal 3 vessel morphology. Moderate atherosclerotic change seen about the aortic arch and origin of the great vessels. Atheromatous irregularity with stenoses measuring up to 30% seen at the mid left subclavian artery beyond the takeoff of the left vertebral artery. Visualized right subclavian artery widely patent. Right carotid system: Right CCA tortuous proximally. Scattered atheromatous irregularity within the right CCA without hemodynamically significant stenosis. Mix concentric plaque about the right bifurcation/proximal right ICA. Resultant stenosis of up to 65-70% by NASCET criteria. Right ICA otherwise widely patent distally to the skull base without stenosis, dissection, or occlusion. Left carotid system: Atheromatous plaque at the origin of the left CCA with short-segment 50% stenosis. Additional moderate approximate 50% diffuse narrowing seen just distally within the proximal left CCA (series 8, image 293). Left CCA otherwise patent to the bifurcation without flow-limiting stenosis. Mild plaque about the left bifurcation and proximal left ICA without hemodynamically significant stenosis. Left ICA otherwise patent distally to the skull base without stenosis, dissection or occlusion. Vertebral arteries: Both vertebral arteries arise from the subclavian arteries. Atheromatous plaque at the origin of the vertebral arteries bilaterally without high-grade stenosis. Vertebral arteries mildly irregular but widely patent distally to the skull base  without stenosis, dissection or occlusion. Skeleton: No acute osseous finding. No discrete lytic or blastic osseous lesions. Mild to moderate cervical spondylolysis noted at C5-6. Other neck: No other acute soft tissue abnormality within the neck. No mass lesion or adenopathy. Upper chest: Visualized upper chest demonstrates no acute finding. Subpleural fibrotic changes noted within the partially visualized lungs. Review of the  MIP images confirms the above findings CTA HEAD FINDINGS Anterior circulation: Petrous segments patent bilaterally. Scattered atheromatous plaque within the cavernous/supraclinoid ICAs bilaterally, left worse than right. Resultant mild to moderate narrowing at the para clinoid right ICA. There is a short-segment fairly severe stenosis at the supraclinoid left ICA (series 8, image 108). ICA termini well perfused. Right A1 widely patent. Left A1 hypoplastic and/or absent. Normal anterior communicating artery. Multifocal atheromatous irregularity with stenoses seen involving the ACAs bilaterally. Both ICAs are perfused to their distal aspects. Irregular moderate approximate 50% stenosis seen involving the proximal right M1 segment (series 8, image 106). Area of involvement measures approximately 7 mm in length. Right M1 patent distally. Normal right MCA bifurcation. Distal right MCA branches are well perfused, although demonstrate extensive small vessel atheromatous irregularity. Left M1 irregular with short-segment moderate stenosis at its mid aspect. Negative left MCA bifurcation. Extensive atheromatous irregularity with multifocal stenoses seen involving the distal left MCA branches. Specific note made of a short-segment severe proximal left M2 stenosis (series 8, image 100). Posterior circulation: Right vertebral artery widely patent to the vertebrobasilar junction without significant stenosis. Patent right PICA. Short-segment severe stenosis seen involving the mid left V4 segment just prior  to the takeoff of the left PICA (series 8, image 138). Left PICA patent. Additional short-segment moderate stenosis at the distal left V4 segment just prior to the vertebrobasilar junction. Multifocal atheromatous or layering 80 seen involving the basilar artery with associated mild narrowing at its mid aspect. Superior cerebral arteries patent bilaterally. Both PCAs primarily supplied via the basilar. PCAs are fairly diseased with extensive atheromatous irregularity and moderate to severe multifocal stenoses bilaterally. There is an apparent 2 mm focal outpouching arising from the mid left P2 segment, suspicious for a small aneurysm (series 9, image 108). Venous sinuses: Patent. Anatomic variants: None significant. Review of the MIP images confirms the above findings IMPRESSION: 1. Negative CTA for large vessel occlusion. 2. Extensive mixed plaque about the origin of the right ICA with associated stenosis of up to 65-70% by NASCET criteria. 3. Multifocal moderate 50% stenoses seen involving the origin and proximal left common carotid artery as above. 4. Extensive atheromatous disease throughout the intracranial circulation with multifocal moderate to severe stenoses as above. Notable findings include a severe stenosis at the supraclinoid left ICA, moderate proximal right M1 and mid left M1 stenoses, with moderate to severe multifocal left V4 stenoses. Both PCAs are heavily diseased with extensive atheromatous irregularity. 5. 2 mm focal outpouching arising from the right PCA as above, suspicious for a small aneurysm. Electronically Signed   By: Rise Mu M.D.   On: 04/19/2019 04:47    DG Chest 2 View   Result Date: 04/19/2019 CLINICAL DATA:  Left-sided weakness, expressive aphasia and confusion for the past 2 days. EXAM: CHEST - 2 VIEW COMPARISON:  08/24/2012 FINDINGS: Grossly unchanged cardiac silhouette and mediastinal contours with atherosclerotic plaque within the thoracic aorta. There is  persistent thickening the right paratracheal stripe presumably secondary to prominent vasculature. Lungs remain hyperexpanded with flattening of the diaphragms and diffuse slightly nodular thickening of the pulmonary interstitium. No discrete focal airspace opacities. No pleural effusion or pneumothorax. No evidence of edema. Old/healed fractures involving the posterolateral aspect of the left ninth and tenth ribs. No acute osseous abnormalities. IMPRESSION: 1. Similar findings of lung hyperexpansion and chronic bronchitic change without superimposed acute cardiopulmonary disease. 2.  Aortic Atherosclerosis (ICD10-I70.0). Electronically Signed   By: Simonne Come M.D.   On: 04/19/2019 08:14  CT HEAD WO CONTRAST   Result Date: 04/18/2019 CLINICAL DATA:  Focal neural deficit for more than 6 hours suspected stroke, history hypertension, former smoker EXAM: CT HEAD WITHOUT CONTRAST TECHNIQUE: Contiguous axial images were obtained from the base of the skull through the vertex without intravenous contrast. Sagittal and coronal MPR images reconstructed from axial data set. COMPARISON:  08/24/2012 FINDINGS: Brain: Generalized atrophy. Normal ventricular morphology. No midline shift or mass effect. Small vessel chronic ischemic changes of deep cerebral white matter. Old BILATERAL caudate lacunar infarcts. Old BILATERAL basal ganglia lacunar infarcts with extension on LEFT into periventricular white matter in the LEFT frontal lobe. No intracranial hemorrhage, mass lesion or evidence of acute infarction. No extra-axial fluid collections. Vascular: Atherosclerotic calcification of internal carotid arteries at skull base Skull: Intact Sinuses/Orbits: Clear Other: N/A IMPRESSION: Atrophy with small vessel chronic ischemic changes of deep cerebral white matter. Old lacunar infarcts as above. No acute intracranial abnormalities. Electronically Signed   By: Lavonia Dana M.D.   On: 04/18/2019 14:40    CT ANGIO NECK W OR WO  CONTRAST   Result Date: 04/19/2019 CLINICAL DATA:  Initial evaluation for acute stroke. EXAM: CT ANGIOGRAPHY HEAD AND NECK TECHNIQUE: Multidetector CT imaging of the head and neck was performed using the standard protocol during bolus administration of intravenous contrast. Multiplanar CT image reconstructions and MIPs were obtained to evaluate the vascular anatomy. Carotid stenosis measurements (when applicable) are obtained utilizing NASCET criteria, using the distal internal carotid diameter as the denominator. CONTRAST:  75mL OMNIPAQUE IOHEXOL 350 MG/ML SOLN COMPARISON:  Prior CT and MRI from 04/18/2019. FINDINGS: CTA NECK FINDINGS Aortic arch: Visualized aortic arch of normal caliber with normal 3 vessel morphology. Moderate atherosclerotic change seen about the aortic arch and origin of the great vessels. Atheromatous irregularity with stenoses measuring up to 30% seen at the mid left subclavian artery beyond the takeoff of the left vertebral artery. Visualized right subclavian artery widely patent. Right carotid system: Right CCA tortuous proximally. Scattered atheromatous irregularity within the right CCA without hemodynamically significant stenosis. Mix concentric plaque about the right bifurcation/proximal right ICA. Resultant stenosis of up to 65-70% by NASCET criteria. Right ICA otherwise widely patent distally to the skull base without stenosis, dissection, or occlusion. Left carotid system: Atheromatous plaque at the origin of the left CCA with short-segment 50% stenosis. Additional moderate approximate 50% diffuse narrowing seen just distally within the proximal left CCA (series 8, image 293). Left CCA otherwise patent to the bifurcation without flow-limiting stenosis. Mild plaque about the left bifurcation and proximal left ICA without hemodynamically significant stenosis. Left ICA otherwise patent distally to the skull base without stenosis, dissection or occlusion. Vertebral arteries: Both  vertebral arteries arise from the subclavian arteries. Atheromatous plaque at the origin of the vertebral arteries bilaterally without high-grade stenosis. Vertebral arteries mildly irregular but widely patent distally to the skull base without stenosis, dissection or occlusion. Skeleton: No acute osseous finding. No discrete lytic or blastic osseous lesions. Mild to moderate cervical spondylolysis noted at C5-6. Other neck: No other acute soft tissue abnormality within the neck. No mass lesion or adenopathy. Upper chest: Visualized upper chest demonstrates no acute finding. Subpleural fibrotic changes noted within the partially visualized lungs. Review of the MIP images confirms the above findings CTA HEAD FINDINGS Anterior circulation: Petrous segments patent bilaterally. Scattered atheromatous plaque within the cavernous/supraclinoid ICAs bilaterally, left worse than right. Resultant mild to moderate narrowing at the para clinoid right ICA. There is a short-segment fairly severe stenosis  at the supraclinoid left ICA (series 8, image 108). ICA termini well perfused. Right A1 widely patent. Left A1 hypoplastic and/or absent. Normal anterior communicating artery. Multifocal atheromatous irregularity with stenoses seen involving the ACAs bilaterally. Both ICAs are perfused to their distal aspects. Irregular moderate approximate 50% stenosis seen involving the proximal right M1 segment (series 8, image 106). Area of involvement measures approximately 7 mm in length. Right M1 patent distally. Normal right MCA bifurcation. Distal right MCA branches are well perfused, although demonstrate extensive small vessel atheromatous irregularity. Left M1 irregular with short-segment moderate stenosis at its mid aspect. Negative left MCA bifurcation. Extensive atheromatous irregularity with multifocal stenoses seen involving the distal left MCA branches. Specific note made of a short-segment severe proximal left M2 stenosis  (series 8, image 100). Posterior circulation: Right vertebral artery widely patent to the vertebrobasilar junction without significant stenosis. Patent right PICA. Short-segment severe stenosis seen involving the mid left V4 segment just prior to the takeoff of the left PICA (series 8, image 138). Left PICA patent. Additional short-segment moderate stenosis at the distal left V4 segment just prior to the vertebrobasilar junction. Multifocal atheromatous or layering 80 seen involving the basilar artery with associated mild narrowing at its mid aspect. Superior cerebral arteries patent bilaterally. Both PCAs primarily supplied via the basilar. PCAs are fairly diseased with extensive atheromatous irregularity and moderate to severe multifocal stenoses bilaterally. There is an apparent 2 mm focal outpouching arising from the mid left P2 segment, suspicious for a small aneurysm (series 9, image 108). Venous sinuses: Patent. Anatomic variants: None significant. Review of the MIP images confirms the above findings IMPRESSION: 1. Negative CTA for large vessel occlusion. 2. Extensive mixed plaque about the origin of the right ICA with associated stenosis of up to 65-70% by NASCET criteria. 3. Multifocal moderate 50% stenoses seen involving the origin and proximal left common carotid artery as above. 4. Extensive atheromatous disease throughout the intracranial circulation with multifocal moderate to severe stenoses as above. Notable findings include a severe stenosis at the supraclinoid left ICA, moderate proximal right M1 and mid left M1 stenoses, with moderate to severe multifocal left V4 stenoses. Both PCAs are heavily diseased with extensive atheromatous irregularity. 5. 2 mm focal outpouching arising from the right PCA as above, suspicious for a small aneurysm. Electronically Signed   By: Rise Mu M.D.   On: 04/19/2019 04:47    MR BRAIN WO CONTRAST   Result Date: 04/18/2019 CLINICAL DATA:  Suspected  stroke, left-sided weakness. EXAM: MRI HEAD WITHOUT CONTRAST TECHNIQUE: Multiplanar, multiecho pulse sequences of the brain and surrounding structures were obtained without intravenous contrast. COMPARISON:  Head CT 04/18/2019, head CT 08/24/2012 FINDINGS: Brain: Acute infarct within the right corona radiata measuring 0.8 x 1.4 x 1.0 cm (series 3, image 34) (series 4, image 17). Corresponding T2/FLAIR hyperintensity at this site. No evidence of intracranial mass. No midline shift or extra-axial fluid collection. Redemonstrated are multiple chronic lacunar infarcts within the left front lobe periventricular white matter, as well as bilateral basal ganglia and thalami. There is a background of otherwise moderate chronic small vessel ischemic disease. There are several chronic microhemorrhages, some supratentorial as well as within the bilateral dentate nuclei. Vascular: Flow voids maintained within the proximal large arterial vessels. Skull and upper cervical spine: No focal marrow lesion Sinuses/Orbits: Visualized orbits demonstrate no acute abnormality. Minimal ethmoid sinus mucosal thickening. Trace fluid within bilateral mastoid air cells. IMPRESSION: 1.4 cm acute infarct within the right corona radiata. Redemonstrated are multiple  chronic lacunar infarcts within the left frontal lobe periventricular white matter, as well as bilateral basal ganglia and thalami. Background of otherwise moderate chronic small vessel ischemic disease. 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 Does the need for close, 24 hr/day medical supervision in concert with the patient's rehab needs make it unreasonable for this patient to be  served in a less intensive setting? Yes 2. Co-Morbidities requiring supervision/potential complications: migraine h/a's, depression, htn, recent weight loss 3. Due to bladder management, bowel management, safety, skin/wound care, disease management, medication administration, pain management and patient education, does the patient require 24 hr/day rehab nursing? Yes 4. Does the patient require coordinated care of a physician, rehab nurse, PT, OT, and potentially SLP  to address physical and functional deficits in the context of the above medical diagnosis(es)? Yes Addressing deficits in the following areas: balance, endurance, locomotion, strength, transferring, bowel/bladder control, bathing, dressing, feeding, grooming, toileting, speech and psychosocial support 5. Can the patient actively participate in an intensive therapy program of at least 3 hrs of therapy per day at least 5 days per week? Yes 6. The potential for patient to make measurable gains while on inpatient rehab is excellent 7. Anticipated functional outcomes upon discharge from inpatient rehab are supervision and min assist  with PT, supervision and min assist with OT, modified independent with SLP. 8. Estimated rehab length of stay to reach the above functional goals is: 14-18 days 9. Anticipated discharge destination: Home 10. Overall Rehab/Functional Prognosis: excellent   RECOMMENDATIONS: This patient's condition is appropriate for continued rehabilitative care in the following setting: CIR Patient has agreed to participate in recommended program. Yes and Potentially Note that insurance prior authorization may be required for reimbursement for recommended care.   Comment: Rehab Admissions Coordinator to follow up.   Thanks,   Ranelle Oyster, MD, FAAPMR   I have personally reviewed this patient's chart. Additionally, I have examined pertinent labs and radiographic images. I have reviewed and concur with the physician  assistant's documentation above.     Mcarthur Rossetti Angiulli, PA-C 04/19/2019        Revision History                     Routing History

## 2019-04-25 NOTE — H&P (Signed)
Physical Medicine and Rehabilitation Admission H&P    Chief Complaint  Patient presents with  . Stroke Symptoms  : HPI: Patricia Stanley is a 73 year old right-handed female with history of hypertension, depression, migraine headaches and quit smoking 23 years ago.  Per chart review she lives with her spouse.  1 level home 4 steps to entry.  By report patient ambulatory in the home but has not left the house in over a year and was very sedentary as well as reports of significant weight loss over the past year.  Patient presented 04/18/2019 with left-sided weakness x3 days with difficulty in speech and facial droop.  Noted blood pressure systolic in the 180s.  Cranial CT scan negative.  Patient did not receive TPA.  MRI of the brain showed a 1.4 cm acute infarction within the right corona radiata in addition to multiple chronic lacunar infarcts within the left frontal lobe periventricular white matter as well as bilateral basal ganglia and thalami.  CTA of head and neck with extensive plaque right ICA with 65 to 70% stenosis as well as carotid Dopplers with right ICA 40 to 59% stenosis..  Patient underwent emergent right carotid enterectomy per vascular surgery Dr. Chestine Spore 04/21/2019.  Neurology services follow-up and maintained on aspirin 325 mg as well as Plavix x3 months then aspirin alone.  Patient is also maintained on Lovenox for DVT prophylaxis.  Echocardiogram with ejection fraction of 65% without emboli.  Gastroenterology consulted 04/22/2019 for history of unintentional weight loss with CT of the abdomen showing no definite malignancy but did show pneumomediastinum and fluid in the upper mediastinum and lower right neck consistent with postsurgical changes but in addition, did show distal esophageal thickening and raising the question of esophagitis or even possibly a mass.  Patient did have an endoscopy and colonoscopy 3-1/2 years ago by Dr. Laural Benes that showed endoscopy normal specifically  without evidence of any esophagitis and with normal duodenal biopsies.  The colonoscopy was also reportedly negative.  Patient underwent esophagram/barium swallow showing no evidence of esophageal leak.  No esophageal mass lesion evident.  Therapy evaluations completed and patient was admitted for a comprehensive rehab program.  Review of Systems  Constitutional: Negative for chills and fever.  HENT: Negative for hearing loss.   Eyes: Negative for blurred vision and double vision.  Respiratory: Negative for cough and shortness of breath.   Cardiovascular: Negative for chest pain, palpitations and leg swelling.  Gastrointestinal: Positive for diarrhea, heartburn and nausea. Negative for vomiting.       GERD  Genitourinary: Negative for flank pain and hematuria.  Musculoskeletal: Positive for joint pain and myalgias.  Skin: Negative for rash.  Neurological: Positive for weakness and headaches.  Psychiatric/Behavioral: Positive for depression. The patient has insomnia.   All other systems reviewed and are negative.  Past Medical History:  Diagnosis Date  . Closed fracture of coccyx (HCC) 2015   AFTER FALL  . Depression   . GERD (gastroesophageal reflux disease)   . Headache    MIGRAINES OCCASIONAL  . History of kidney stones    HAS NOW  . Hypertension    Past Surgical History:  Procedure Laterality Date  . ABDOMINAL HYSTERECTOMY     1 OVARY LEFT  . APPENDECTOMY  AGE 63   DONE WITH OVARY REMOVAL  . COLONOSCOPY WITH PROPOFOL N/A 12/03/2015   Procedure: COLONOSCOPY WITH PROPOFOL;  Surgeon: Charolett Bumpers, MD;  Location: [facility];  Service: Endoscopy;  Laterality: N/A;  . ENDARTERECTOMY  Right 04/21/2019   Procedure: ENDARTERECTOMY CAROTID;  Surgeon: Marty Heck, MD;  Location: [facility];  Service: Vascular;  Laterality: Right;  . ESOPHAGOGASTRODUODENOSCOPY (EGD) WITH PROPOFOL N/A 12/03/2015   Procedure: ESOPHAGOGASTRODUODENOSCOPY (EGD) WITH PROPOFOL;  Surgeon: Garlan Fair, MD;  Location: [facility];  Service: Endoscopy;  Laterality: N/A;  . TONSILLECTOMY  AGE 36   AND ADENOIDS REMOVED   History reviewed. No pertinent family history. Social History:  reports that she quit smoking about 23 years ago. She has never used smokeless tobacco. She reports that she does not drink alcohol or use drugs. Allergies:  Allergies  Allergen Reactions  . Paroxetine Other (See Comments)    Shakes-tremors   Medications Prior to Admission  Medication Sig Dispense Refill  . atenolol (TENORMIN) 50 MG tablet Take 50 mg by mouth every evening.     . clonazePAM (KLONOPIN) 1 MG tablet Take 1 tablet (1 mg total) by mouth 2 (two) times daily. (Patient taking differently: Take 1-2 mg by mouth See admin instructions. Take 1 tablet in the morning, and 2 tablets at bedtime) 30 tablet 1  . cloNIDine (CATAPRES) 0.3 MG tablet Take 0.3 mg by mouth daily.    . ondansetron (ZOFRAN) 4 MG tablet Take 1 tablet (4 mg total) by mouth every 6 (six) hours. (Patient taking differently: Take 4 mg by mouth every 8 (eight) hours as needed for nausea or vomiting. ) 12 tablet 0  . simvastatin (ZOCOR) 40 MG tablet Take 40 mg by mouth every evening.    . topiramate (TOPAMAX) 100 MG tablet Take 150 mg by mouth at bedtime. For headaches      Drug Regimen Review Drug regimen was reviewed and remains appropriate with no significant issues identified  Home: Home Living Family/patient expects to be discharged to:: Private residence Living Arrangements: Spouse/significant other Available Help at Discharge: Family, Available 24 hours/day Type of Home: House Home Access: Stairs to enter [employer] of Steps: 4 Entrance Stairs-Rails: Can reach both Home Layout: One level Bathroom Shower/Tub: Chiropodist: Standard Home Equipment: Environmental consultant - 2 wheels, Bedside commode, Shower seat, Grab bars - tub/shower, Hand held shower head  Lives With: Spouse   Functional  History: Prior Function Level of Independence: Needs assistance Gait / Transfers Assistance Needed: didn't use AD ADL's / Homemaking Assistance Needed: family assist pt into shower and on the chair,  Communication / Swallowing Assistance Needed: spouse states her mind isn't as fast as it used to be, she's had a hard time explaining things to me Comments: spouse states she hasn't been out of the house in a  year  Functional Status:  Mobility: Bed Mobility Overal bed mobility: Needs Assistance Bed Mobility: Supine to Sit, Sit to Supine Rolling: Min guard Sidelying to sit: Min assist Supine to sit: Min assist Sit to supine: Min assist General bed mobility comments: Pt required assistance to roll to R side and elevate trunk into sitting.  Pt with heavy reliance on railing to move to sitting.  Cues for LUE awareness but is moving LUE and LE better this session. Transfers Overall transfer level: Needs assistance Equipment used: Rolling walker (2 wheeled) Transfers: Sit to/from Stand, [employer] Transfers Sit to Stand: Min assist Stand pivot transfers: Mod assist General transfer comment: SPT without RW Ambulation/Gait Ambulation/Gait assistance: Mod assist Gait Distance (Feet): 8 Feet(3 trials of 8') Assistive device: Rolling walker (2 wheeled) Gait Pattern/deviations: Step-to pattern, Scissoring General Gait Details: pt with short step to gait with inconsistent  placement of L foot, intermittent scissoring. PT providing verbal and visual cues to increase width of base of support Gait velocity: reduced Gait velocity interpretation: <1.31 ft/sec, indicative of household ambulator    ADL: ADL Overall ADL's : Needs assistance/impaired Eating/Feeding: Set up, Sitting Eating/Feeding Details (indicate cue type and reason): assist cutting up food, opening containers as patient is L hand dominant and having to use mainly R hand for self care tasks Grooming: Oral care, Wash/dry face,  Sitting, Minimal assistance Grooming Details (indicate cue type and reason): set up initially, min A for trunk control and to correct posture in order to spit into basin Upper Body Bathing: Sitting, Moderate assistance Upper Body Bathing Details (indicate cue type and reason): due to deficits in L UE Lower Body Bathing: Sitting/lateral leans, Total assistance Lower Body Bathing Details (indicate cue type and reason): decreased trunk control Upper Body Dressing : Moderate assistance, Sitting Upper Body Dressing Details (indicate cue type and reason): due to limited trunk control and patient limited use of dominant hand Lower Body Dressing: Total assistance, Sitting/lateral leans Lower Body Dressing Details (indicate cue type and reason): limited trunk control, limited use of L UE Toilet Transfer: +2 for physical assistance, +2 for safety/equipment, Stand-pivot, BSC Toilet Transfer Details (indicate cue type and reason): did not attempt this session as patient's breakfast arrived, anticipate assist of 2 for transfers for safety as patient has limited core stabilization  Toileting- Clothing Manipulation and Hygiene: Total assistance, Sitting/lateral lean General ADL Comments: min A for UB ADL sitting supported in seat, difficulty with trunk control  Cognition: Cognition Overall Cognitive Status: Impaired/Different from baseline Arousal/Alertness: Awake/alert Orientation Level: Oriented X4 Attention: Focused, Sustained Focused Attention: Appears intact Sustained Attention: Impaired Sustained Attention Impairment: Verbal basic, Functional basic Memory: Impaired Memory Impairment: Storage deficit, Retrieval deficit, Decreased recall of new information, Decreased short term memory Decreased Short Term Memory: Verbal basic, Functional basic Awareness: Impaired Awareness Impairment: Intellectual impairment Problem Solving: Impaired Problem Solving Impairment: Verbal basic, Functional  basic Executive Function: Organizing, Self Monitoring Organizing: Impaired Self Monitoring: Impaired Safety/Judgment: Impaired Cognition Arousal/Alertness: Awake/alert Behavior During Therapy: WFL for tasks assessed/performed Overall Cognitive Status: Impaired/Different from baseline Area of Impairment: Memory Memory: Decreased short-term memory Following Commands: Follows one step commands with increased time Safety/Judgement: Decreased awareness of safety, Decreased awareness of deficits Awareness: Intellectual Problem Solving: Slow processing, Decreased initiation, Requires verbal cues  Physical Exam: Blood pressure (!) 97/54, pulse 72, temperature 98 F (36.7 C), temperature source Oral, resp. rate 10, height 5' 1.5" (1.562 m), weight 41.5 kg, SpO2 98 %. General: Alert and oriented x 3, Crying because she will not be home for Christmas HEENT: Head is normocephalic, atraumatic, PERRLA, EOMI, sclera anicteric, oral mucosa pink and moist, dentition intact, ext ear canals clear,  Neck: Supple without JVD or lymphadenopathy Heart: Reg rate and rhythm. No murmurs rubs or gallops Chest: CTA bilaterally without wheezes, rales, or rhonchi; no distress Abdomen: Soft, non-tender, non-distended, bowel sounds positive. Extremities: No clubbing, cyanosis, or edema. Pulses are 2+ Skin: Clean and intact without signs of breakdown Neuro: Patient is alert resting comfortably in no acute distress.  Makes good eye contact with examiner.  Provides her name age and date of birth.  Fair medical historian.  Musculoskeletal:  4/5 strength in RUE, RLE, and LLE 3/5 strength in LUE Psych: Pt's affect is appropriate. Pt is cooperative  No results found for this or any previous visit (from the past 48 hour(s)). No results found.  Medical Problem List and  Plan: 1.  Left-sided weakness secondary to right corona radiata infarction  -patient may shower  -ELOS/Goals: MinA in PT, OT, I in SLP 2.   Antithrombotics: -DVT/anticoagulation: Lovenox  -antiplatelet therapy: Aspirin 325 mg daily and Plavix 75 mg daily x3 months then aspirin alone 3. Pain Management: Hydrocodone as needed 4. Mood: Klonopin 1 mg every morning 2 mg nightly  -antipsychotic agents: N/A  She is depressed about being home in the hospital during Christmas. Husband plans to celebrate Christmas once patient is discharged from rehab. Advised regarding the importance of rehab in the weeks following stroke and that we can try to facilitate FaceTiming with her family while here.  5. Neuropsych: This patient is capable of making decisions on her own behalf. 6. Skin/Wound Care: Routine skin checks 7. Fluids/Electrolytes/Nutrition: Routine in and outs with follow-up chemistries 8.  Symptomatic right carotid stenosis.  Status post right carotid enterectomy 04/21/2019. 9.  Hypertension.  Hydralazine 25 mg every 6 hours, Tenormin 50 mg daily, lisinopril 10 mg daily.  Monitor with increased mobility 10.  Unintentional weight loss.  Work-up per GI services.  Protonix 40 mg twice daily.  Dietary follow-up. Husband says she has been eating better in the hospital than she was at home. 11.  Hyperlipidemia.  Lipitor  Mcarthur Rossetti Angiulli, PA-C 04/25/2019   I have personally performed a face to face diagnostic evaluation, including, but not limited to relevant history and physical exam findings, of this patient and developed relevant assessment and plan.  Additionally, I have reviewed and concur with the physician assistant's documentation above.  Sula Soda, MD

## 2019-04-26 ENCOUNTER — Inpatient Hospital Stay (HOSPITAL_COMMUNITY): Payer: Medicare HMO | Admitting: Occupational Therapy

## 2019-04-26 ENCOUNTER — Inpatient Hospital Stay (HOSPITAL_COMMUNITY): Payer: Medicare HMO | Admitting: Physical Therapy

## 2019-04-26 ENCOUNTER — Inpatient Hospital Stay (HOSPITAL_COMMUNITY): Payer: Medicare HMO

## 2019-04-26 DIAGNOSIS — I639 Cerebral infarction, unspecified: Secondary | ICD-10-CM

## 2019-04-26 LAB — CBC WITH DIFFERENTIAL/PLATELET
Abs Immature Granulocytes: 0.01 10*3/uL (ref 0.00–0.07)
Basophils Absolute: 0 10*3/uL (ref 0.0–0.1)
Basophils Relative: 1 %
Eosinophils Absolute: 0.5 10*3/uL (ref 0.0–0.5)
Eosinophils Relative: 10 %
HCT: 31.1 % — ABNORMAL LOW (ref 36.0–46.0)
Hemoglobin: 9.9 g/dL — ABNORMAL LOW (ref 12.0–15.0)
Immature Granulocytes: 0 %
Lymphocytes Relative: 35 %
Lymphs Abs: 1.5 10*3/uL (ref 0.7–4.0)
MCH: 30.7 pg (ref 26.0–34.0)
MCHC: 31.8 g/dL (ref 30.0–36.0)
MCV: 96.6 fL (ref 80.0–100.0)
Monocytes Absolute: 0.4 10*3/uL (ref 0.1–1.0)
Monocytes Relative: 10 %
Neutro Abs: 1.9 10*3/uL (ref 1.7–7.7)
Neutrophils Relative %: 44 %
Platelets: 123 10*3/uL — ABNORMAL LOW (ref 150–400)
RBC: 3.22 MIL/uL — ABNORMAL LOW (ref 3.87–5.11)
RDW: 12.9 % (ref 11.5–15.5)
WBC: 4.3 10*3/uL (ref 4.0–10.5)
nRBC: 0 % (ref 0.0–0.2)

## 2019-04-26 LAB — COMPREHENSIVE METABOLIC PANEL
ALT: 28 U/L (ref 0–44)
AST: 41 U/L (ref 15–41)
Albumin: 2.7 g/dL — ABNORMAL LOW (ref 3.5–5.0)
Alkaline Phosphatase: 74 U/L (ref 38–126)
Anion gap: 10 (ref 5–15)
BUN: 15 mg/dL (ref 8–23)
CO2: 27 mmol/L (ref 22–32)
Calcium: 9 mg/dL (ref 8.9–10.3)
Chloride: 105 mmol/L (ref 98–111)
Creatinine, Ser: 0.91 mg/dL (ref 0.44–1.00)
GFR calc Af Amer: >60 mL/min (ref >60)
GFR calc non Af Amer: >60 mL/min (ref >60)
Glucose, Bld: 96 mg/dL (ref 70–99)
Potassium: 4.1 mmol/L (ref 3.5–5.1)
Sodium: 142 mmol/L (ref 135–145)
Total Bilirubin: 0.5 mg/dL (ref 0.3–1.2)
Total Protein: 5.3 g/dL — ABNORMAL LOW (ref 6.5–8.1)

## 2019-04-26 MED ORDER — HYDRALAZINE HCL 25 MG PO TABS
25.0000 mg | ORAL_TABLET | Freq: Four times a day (QID) | ORAL | Status: DC
  Administered 2019-04-26 – 2019-05-05 (×33): 25 mg via ORAL
  Filled 2019-04-26 (×34): qty 1

## 2019-04-26 NOTE — Progress Notes (Signed)
Physical Therapy Session Note  Patient Details  Name: Patricia Stanley MRN: [id number] Date of Birth: 1945-11-27  Today's Date: 04/26/2019 PT Individual Time: 1624-4695 PT Individual Time Calculation (min): 38 min   Short Term Goals: Week 1:  PT Short Term Goal 1 (Week 1): Pt will initiate stair training PT Short Term Goal 2 (Week 1): Pt will ambulate 25' w/ min assist PT Short Term Goal 3 (Week 1): Pt will perform stand pivot w/ min assist PT Short Term Goal 4 (Week 1): Pt will maintain dynamic standing balance w/ min assist  Skilled Therapeutic Interventions/Progress Updates: Pt presented in w/c agreeable to therapy with husband present. Pt c/o pain in L hand but states was recently premedicated. Pt transported to ortho gym and performed ambulatory transfer to car with RW requiring minA for gait as required verbal cues for LLE placement and tactile cues to decrease posterior lean upon standing. Performed car transfer with minA and increased time for processing. Pt then ambulated to mat approx 58f and performed seated reaching activity placing cards on board for forced use of LUE with verbal cues for opening hand and then performing HLyttonassist to place card on board with emphasis on anteriolateral reaching to R. Once completed pt transported back to room and requested to use bathroom. Pt ambulated to toilet from sink with HHA and modA with L Trendelenburg and decreased L knee flexion. PTA provided maxA for LB clothing management and CGA for toilet transfers (+void). Pt then ambulated with RW to bed with minA and mod multimodal cues for RW management. Performed sit to supine with bed flat and use of bed rail with CGA. Pt repositioned to comfort and left with bed alarm on, call bell within reach and needs met.     Therapy Documentation Precautions:  Precautions Precautions: Fall Precaution Comments: L hemi Restrictions Weight Bearing Restrictions: No General:   Vital Signs:   Pain:      Other Treatments:      Therapy/Group: Individual Therapy  Callista Hoh 04/26/2019, 4:00 PM

## 2019-04-26 NOTE — Evaluation (Signed)
Physical Therapy Assessment and Plan  Patient Details  Name: Patricia Stanley MRN: [id number] Date of Birth: [birth year]  PT Diagnosis: Abnormal posture, Abnormality of gait, Coordination disorder, Difficulty walking, Hemiplegia dominant, Impaired sensation and Muscle weakness Rehab Potential: Good ELOS: 10-14 days   Today's Date: 04/26/2019 PT Individual Time: 0905-1000 PT Individual Time Calculation (min): 55 min    Problem List:  Patient Active Problem List   Diagnosis Date Noted  . Small vessel cerebrovascular accident (CVA) ([place]) 04/25/2019  . Acute CVA (cerebrovascular accident) ([place]) 04/18/2019  . Weight loss, unintentional 04/18/2019  . Anxiety 04/18/2019  . Major depression in partial remission ([place]) 08/25/2012  . Osteoporosis, unspecified 08/25/2012  . HTN (hypertension) 08/25/2012  . Fall at home 08/24/2012  . Pelvic fracture ([place]) 08/24/2012  . Migraines 08/24/2012  . Renal insufficiency, mild 08/24/2012    Past Medical History:  Past Medical History:  Diagnosis Date  . Closed fracture of coccyx ([place]) 2015   AFTER FALL  . Depression   . GERD (gastroesophageal reflux disease)   . Headache    MIGRAINES OCCASIONAL  . History of kidney stones    HAS NOW  . Hypertension    Past Surgical History:  Past Surgical History:  Procedure Laterality Date  . ABDOMINAL HYSTERECTOMY     1 OVARY LEFT  . APPENDECTOMY  AGE 76   DONE WITH OVARY REMOVAL  . COLONOSCOPY WITH PROPOFOL N/A 12/03/2015   Procedure: COLONOSCOPY WITH PROPOFOL;  Surgeon: Garlan Fair, MD;  Location: [facility];  Service: Endoscopy;  Laterality: N/A;  . ENDARTERECTOMY Right 04/21/2019   Procedure: ENDARTERECTOMY CAROTID;  Surgeon: Marty Heck, MD;  Location: [place];  Service: Vascular;  Laterality: Right;  . ESOPHAGOGASTRODUODENOSCOPY (EGD) WITH PROPOFOL N/A 12/03/2015   Procedure: ESOPHAGOGASTRODUODENOSCOPY (EGD) WITH PROPOFOL;  Surgeon: Garlan Fair, MD;  Location: [facility];   Service: Endoscopy;  Laterality: N/A;  . TONSILLECTOMY  AGE 85   AND ADENOIDS REMOVED    Assessment & Plan Clinical Impression: Patient is a 73 year old right-handed female with history of hypertension, depression, migraine headaches and quit smoking 23 years ago. Patient presented 04/18/2019 with left-sided weakness x3 days with difficulty in speech and facial droop. Noted blood pressure systolic in the 048G. Cranial CT scan negative. Patient did not receive TPA. MRI of the brain showed a 1.4 cm acute infarction within the right corona radiata in addition to multiple chronic lacunar infarcts within the left frontal lobe periventricular white matter as well as bilateral basal ganglia and thalami. CTA of head and neck with extensive plaque right ICA with 65 to 70% stenosis as well as carotid Dopplers with right ICA 40 to 59% stenosis.. Patient underwent emergent right carotid enterectomy per vascular surgery Dr. Carlis Abbott 04/21/2019. Neurology services follow-up and maintained on aspirin 325 mg as well as Plavix x3 months then aspirin alone. Patient is also maintained on Lovenox for DVT prophylaxis. Echocardiogram with ejection fraction of 65% without emboli. Gastroenterology consulted 04/22/2019 for history of unintentional weight loss with CT of the abdomen showing no definite malignancy but did show pneumomediastinum and fluid in the upper mediastinum and lower right neck consistent with postsurgical changes but in addition, did show distal esophageal thickening and raising the question of esophagitis or even possibly a mass. Patient did have an endoscopy and colonoscopy 3-1/2 years ago by Dr. Wynetta Emery that showed endoscopy normal specifically without evidence of any esophagitis and with normal duodenal biopsies. The colonoscopy was also reportedly negative. Patient underwent esophagram/barium swallow showing  no evidence of esophageal leak. No esophageal mass lesion evident.  Patient transferred to  [facility] on 04/25/2019 .   Patient currently requires mod with mobility secondary to muscle weakness, decreased cardiorespiratoy endurance, impaired timing and sequencing, unbalanced muscle activation, motor apraxia, decreased coordination and decreased motor planning and decreased standing balance, decreased postural control, hemiplegia and decreased balance strategies.  Prior to hospitalization, patient was supervision with mobility and supervision-min assist w/ ADLs and lived with Spouse, Daughter in a House home.  Home access is 4 steps, per chart reviewStairs to enter.  Patient will benefit from skilled PT intervention to maximize safe functional mobility, minimize fall risk and decrease caregiver burden for planned discharge home with 24 hour supervision.  Anticipate patient will benefit from follow up [place] at discharge.  PT - End of Session Activity Tolerance: Tolerates < 10 min activity, no significant change in vital signs Endurance Deficit: Yes Endurance Deficit Description: decreased PT Assessment Rehab Potential (ACUTE/IP ONLY): Good PT Barriers to Discharge: Medical stability PT Patient demonstrates impairments in the following area(s): Balance;Endurance;Motor;Safety;Sensory PT Transfers Functional Problem(s): Floor;Bed Mobility;Bed to Chair;Car;Furniture PT Locomotion Functional Problem(s): Stairs;Wheelchair Mobility;Ambulation PT Plan PT Intensity: Minimum of 1-2 x/day ,45 to 90 minutes PT Frequency: 5 out of 7 days PT Duration Estimated Length of Stay: 10-14 days PT Treatment/Interventions: Ambulation/gait training;Cognitive remediation/compensation;Discharge planning;DME/adaptive equipment instruction;Functional mobility training;Pain management;Psychosocial support;Splinting/orthotics;Therapeutic Activities;UE/LE Strength taining/ROM;Visual/perceptual remediation/compensation;Wheelchair propulsion/positioning;UE/LE Coordination activities;Therapeutic Exercise;Skin care/wound  management;Stair training;Patient/family education;Functional electrical stimulation;Neuromuscular re-education;Disease management/prevention;Community reintegration;Balance/vestibular training PT Transfers Anticipated Outcome(s): supervision PT Locomotion Anticipated Outcome(s): supervision household gait w/ LRAD PT Recommendation Follow Up Recommendations: Home health PT Patient destination: Home Equipment Recommended: To be determined  Skilled Therapeutic Intervention  Pt in supine and agreeable to therapy, no c/o pain. Bed mobility w/ min assist and pt requesting to toilet. Mod assist stand pivot to [facility] w/ verbal and tactile cues for technique and sequencing. Total assist for pericare for time management. Ambulated 10' to w/c w/ mod assist via HHA x1. Total assist w/c transport to/from therapy gym for time management. Worked on Personnel officer w/ RW. Pt w/ increased time, was able to independently place LUE onto RW and grip. Ambulated 10' w/ 2 turns w/ min-mod assist. Verbal and visual cues for LLE step placement, no physical assist needed for LLE management. Switched to 16x16 w/c instead of 18x18 w/ improved fit for pt to sit up in w/c outside of therapy. Attempted to have pt self-propel w/c, only able to propel 10' w/ BUEs prior to pt c/o L hand soreness. Returned to room total assist via w/c.   Instructed pt in results of PT evaluation as detailed below, PT POC, rehab potential, rehab goals, and discharge recommendations. Additionally discussed [facility]'s policies regarding fall safety and use of chair alarm and/or quick release belt. Pt verbalized understanding and in agreement. Ended session in w/c, all needs in reach.  PT Evaluation Precautions/Restrictions Precautions Precautions: Fall Restrictions Weight Bearing Restrictions: No Home Living/Prior Functioning Home Living Available Help at Discharge: Family;Available 24 hours/day(husband works but husband and daughter to arrange for 24/7  assist) Type of Home: House Home Access: Stairs to enter [employer] of Steps: 4 steps, per chart review Entrance Stairs-Rails: Left;Right(can only reach one at a time, according to pt) Home Layout: One level  Lives With: Spouse;Daughter Prior Function Level of Independence: Needs assistance with ADLs;Needs assistance with homemaking(Per chart review, husband and daughter assist w/ housekeeping, ADLs, meals, etc. Pt reports no AD use, but per chart review she was holding  into furniture and walls PTA.)  Able to Take Stairs?: Yes(Yes, but pt reports it has been a while) Driving: No Vocation: Retired Vision/Perception  Vision - Risk analyst: Within Passenger transport manager Range of Motion: Within Functional Limits Alignment/Gaze Preference: Within Defined Limits Tracking/Visual Pursuits: Requires cues, head turns, or add eye shifts to track Perception Perception: Within Functional Limits Praxis Praxis: Impaired Praxis Impairment Details: Ideomotor;Motor planning  Cognition Orientation Level: Oriented X4 Sensation Sensation Light Touch: Appears Intact Proprioception: Impaired by gross assessment(impaired LLE, distal>proximal, pt states she often has difficulty knowing where to place her foot) Coordination Gross Motor Movements are Fluid and Coordinated: No Heel Shin Test: Impaired LLE, impaired accuracy and speed Motor  Motor Motor: Hemiplegia;Motor apraxia Motor - Skilled Clinical Observations: L hemi, UE>LE, mild motor apraxia  Mobility Bed Mobility Bed Mobility: Rolling Right;Rolling Left;Supine to Sit;Sit to Supine Rolling Right: Minimal Assistance - Patient > 75% Rolling Left: Minimal Assistance - Patient > 75% Supine to Sit: Minimal Assistance - Patient > 75% Sit to Supine: Minimal Assistance - Patient > 75% Transfers Transfers: Sit to Stand;Stand to Sit;Stand Pivot Transfers Sit to Stand: Minimal Assistance - Patient > 75% Stand to Sit: Minimal  Assistance - Patient > 75% Stand Pivot Transfers: Moderate Assistance - Patient 50 - 74% Stand Pivot Transfer Details: Manual facilitation for weight shifting;Verbal cues for technique;Verbal cues for sequencing;Tactile cues for posture;Tactile cues for sequencing Transfer (Assistive device): 1 person hand held assist Locomotion  Gait Ambulation: Yes Gait Assistance: Moderate Assistance - Patient 50-74% Gait Distance (Feet): 10 Feet Assistive device: 1 person hand held assist Gait Gait: Yes Gait Pattern: Impaired Gait Pattern: Step-to pattern;Decreased step length - left;Decreased stance time - left;Decreased dorsiflexion - left;Decreased hip/knee flexion - left;Narrow base of support;Poor foot clearance - left Stairs / Additional Locomotion Stairs: No Wheelchair Mobility Wheelchair Mobility: Yes Wheelchair Assistance: Moderate Assistance - Patient 50 - 74% Wheelchair Propulsion: Both upper extremities Wheelchair Parts Management: Needs assistance Distance: 10'  Trunk/Postural Assessment  Cervical Assessment Cervical Assessment: Within Functional Limits Thoracic Assessment Thoracic Assessment: Exceptions to WFL(kyphotic) Lumbar Assessment Lumbar Assessment: Within Functional Limits Postural Control Postural Control: Deficits on evaluation(delayed/insufficient)  Balance Balance Balance Assessed: Yes Static Sitting Balance Static Sitting - Level of Assistance: 5: Stand by assistance Dynamic Sitting Balance Dynamic Sitting - Level of Assistance: 4: Min assist Static Standing Balance Static Standing - Level of Assistance: 4: Min assist Dynamic Standing Balance Dynamic Standing - Level of Assistance: 3: Mod assist Extremity Assessment  RLE Assessment RLE Assessment: Exceptions to [facility] Passive Range of Motion (PROM) Comments: WFL General Strength Comments: Globally 4/5 LLE Assessment LLE Assessment: Exceptions to [place] Passive Range of Motion (PROM) Comments: WFL General  Strength Comments: Globally 3+ to 4-/5    Refer to Care Plan for Long Term Goals  Recommendations for other services: None   Discharge Criteria: Patient will be discharged from PT if patient refuses treatment 3 consecutive times without medical reason, if treatment goals not met, if there is a change in medical status, if patient makes no progress towards goals or if patient is discharged from hospital.  The above assessment, treatment plan, treatment alternatives and goals were discussed and mutually agreed upon: by patient  Andera Cranmer K Chet Greenley 04/26/2019, 12:30 PM

## 2019-04-26 NOTE — Evaluation (Addendum)
Occupational Therapy Assessment and Plan  Patient Details  Name: Patricia Stanley MRN: [id number] Date of Birth: Sep 22, 1945  OT Diagnosis: ataxia, cognitive deficits and hemiplegia affecting dominant side Rehab Potential: Rehab Potential (ACUTE ONLY): Good ELOS: ~14 days   Today's Date: 04/26/2019 OT Individual Time:  - 1030-1130 (60 min )       Problem List:  Patient Active Problem List   Diagnosis Date Noted  . Small vessel cerebrovascular accident (CVA) ([place]) 04/25/2019  . Acute CVA (cerebrovascular accident) ([place]) 04/18/2019  . Weight loss, unintentional 04/18/2019  . Anxiety 04/18/2019  . Major depression in partial remission ([place]) 08/25/2012  . Osteoporosis, unspecified 08/25/2012  . HTN (hypertension) 08/25/2012  . Fall at home 08/24/2012  . Pelvic fracture ([place]) 08/24/2012  . Migraines 08/24/2012  . Renal insufficiency, mild 08/24/2012    Past Medical History:  Past Medical History:  Diagnosis Date  . Closed fracture of coccyx ([place]) 2015   AFTER FALL  . Depression   . GERD (gastroesophageal reflux disease)   . Headache    MIGRAINES OCCASIONAL  . History of kidney stones    HAS NOW  . Hypertension    Past Surgical History:  Past Surgical History:  Procedure Laterality Date  . ABDOMINAL HYSTERECTOMY     1 OVARY LEFT  . APPENDECTOMY  AGE 32   DONE WITH OVARY REMOVAL  . COLONOSCOPY WITH PROPOFOL N/A 12/03/2015   Procedure: COLONOSCOPY WITH PROPOFOL;  Surgeon: Garlan Fair, MD;  Location: [facility];  Service: Endoscopy;  Laterality: N/A;  . ENDARTERECTOMY Right 04/21/2019   Procedure: ENDARTERECTOMY CAROTID;  Surgeon: Marty Heck, MD;  Location: [place] [facility];  Service: Vascular;  Laterality: Right;  . ESOPHAGOGASTRODUODENOSCOPY (EGD) WITH PROPOFOL N/A 12/03/2015   Procedure: ESOPHAGOGASTRODUODENOSCOPY (EGD) WITH PROPOFOL;  Surgeon: Garlan Fair, MD;  Location: [facility];  Service: Endoscopy;  Laterality: N/A;  . TONSILLECTOMY  AGE 89   AND  ADENOIDS REMOVED    Assessment & Plan Clinical Impression: Patient is a 73 y.o. year old with history of hypertension, depression, migraine headaches and quit smoking 23 years ago.  Per chart review she lives with her spouse.  1 level home 4 steps to entry.  By report patient ambulatory in the home but has not left the house in over a year and was very sedentary as well as reports of significant weight loss over the past year.  Patient presented 04/18/2019 with left-sided weakness x3 days with difficulty in speech and facial droop.  Noted blood pressure systolic in the 537H.  Cranial CT scan negative.  Patient did not receive TPA.  MRI of the brain showed a 1.4 cm acute infarction within the right corona radiata in addition to multiple chronic lacunar infarcts within the left frontal lobe periventricular white matter as well as bilateral basal ganglia and thalami.  CTA of head and neck with extensive plaque right ICA with 65 to 70% stenosis as well as carotid Dopplers with right ICA 40 to 59% stenosis..  Patient underwent emergent right carotid enterectomy per vascular surgery Dr. Carlis Abbott 04/21/2019.  Neurology services follow-up and maintained on aspirin 325 mg as well as Plavix x3 months then aspirin alone.  Patient is also maintained on Lovenox for DVT prophylaxis.  Echocardiogram with ejection fraction of 65% without emboli.  Gastroenterology consulted 04/22/2019 for history of unintentional weight loss with CT of the abdomen showing no definite malignancy but did show pneumomediastinum and fluid in the upper mediastinum and lower right neck consistent with  postsurgical changes but in addition, did show distal esophageal thickening and raising the question of esophagitis or even possibly a mass.  Patient did have an endoscopy and colonoscopy 3-1/2 years ago by Dr. Wynetta Emery that showed endoscopy normal specifically without evidence of any esophagitis and with normal duodenal biopsies.  The colonoscopy was also  reportedly negative.  Patient underwent esophagram/barium swallow showing no evidence of esophageal leak.  No esophageal mass lesion evident.Patient transferred to CIR on 04/25/2019 .    Patient currently requires mod A for basic mobility and mod to max for ADLs secondary to muscle weakness, decreased cardiorespiratoy endurance, impaired timing and sequencing, abnormal tone, unbalanced muscle activation, ataxia and decreased coordination, decreased attention and decreased standing balance, hemiplegia and decreased balance strategies.  Prior to hospitalization, patient could complete ADLs with independent .  Patient will benefit from skilled intervention to decrease level of assist with basic self-care skills and increase independence with basic self-care skills prior to discharge home with care partner.  Anticipate patient will require intermittent supervision and follow up home health.  OT - End of Session Activity Tolerance: Tolerates 30+ min activity with multiple rests Endurance Deficit: Yes Endurance Deficit Description: decreased OT Assessment Rehab Potential (ACUTE ONLY): Good OT Patient demonstrates impairments in the following area(s): Balance;Behavior;Cognition;Endurance;Motor;Pain;Safety;Sensory;Perception OT Basic ADL's Functional Problem(s): Grooming;Bathing;Dressing OT Transfers Functional Problem(s): Toilet;Tub/Shower OT Additional Impairment(s): Fuctional Use of Upper Extremity OT Plan OT Intensity: Minimum of 1-2 x/day, 45 to 90 minutes OT Frequency: 5 out of 7 days OT Duration/Estimated Length of Stay: ~14 days OT Treatment/Interventions: Balance/vestibular training;Discharge planning;Functional electrical stimulation;Pain management;Self Care/advanced ADL retraining;Therapeutic Activities;UE/LE Coordination activities;Cognitive remediation/compensation;Disease mangement/prevention;Functional mobility training;Patient/family education;Skin care/wound managment;Therapeutic  Exercise;Community reintegration;DME/adaptive equipment instruction;Neuromuscular re-education;Psychosocial support;Splinting/orthotics;UE/LE Strength taining/ROM OT Self Feeding Anticipated Outcome(s): n/a OT Basic Self-Care Anticipated Outcome(s): supervision OT Toileting Anticipated Outcome(s): supervision OT Bathroom Transfers Anticipated Outcome(s): supervision OT Recommendation Recommendations for Other Services: Neuropsych consult;Speech consult Patient destination: Home Follow Up Recommendations: Home health OT Equipment Recommended: To be determined   Skilled Therapeutic Intervention 1:1 OT eval initiated with Ot goals, purpose and role discussed with pt and pt's husband.  Self care retraining at shower level. Pt performed bed mobility to come to EOB with mod A with extra time with cues for sequencing. Pt performed transfers to w/c<toilet. Pt ambulated ~5 feet to shower stall without device with mod A. Pt does present with hyperextension of left knee but able to achieve neutral with stability with cues (tactile and verbal). Pt with an inattention to left UE. Pt reports she is left handed. Pt requires cues and environment setup to encourage use of left hand. PT able to reach and retreive items with extra time for motor planning/ coordination. Pt dressed sit to stand with mod A. Education provided on hemi dressing techniques and setup assistance. A provided with fixing her hair. Left sitting up in the chair with husband present.  OT Evaluation Precautions/Restrictions  Precautions Precautions: Fall Precaution Comments: L hemi Restrictions Weight Bearing Restrictions: No General Chart Reviewed: Yes Family/Caregiver Present: Yes(husband) Vital Signs   Pain  no c/o pain in session  Home Living/Prior [place] expects to be discharged to:: Private residence Living Arrangements: Spouse/significant other, Children Available Help at Discharge: Family,  Available 24 hours/day Type of Home: House Home Access: Stairs to enter [employer] of Steps: 4 steps, per chart review Entrance Stairs-Rails: Left, Right Home Layout: One level Bathroom Shower/Tub: Chiropodist: Standard  Lives With: Spouse, Daughter Prior Function Level of  Independence: Needs assistance with ADLs, Needs assistance with homemaking(Per chart review, husband and daughter assist w/ housekeeping, ADLs, meals, etc. Pt reports no AD use, but per chart review she was holding into furniture and walls PTA.)  Able to Take Stairs?: Yes(Yes, but pt reports it has been a while) Driving: No Vocation: Retired ADL ADL Grooming: Maximal assistance Where Assessed-Grooming: Sitting at sink Upper Body Bathing: Minimal assistance Where Assessed-Upper Body Bathing: Shower Lower Body Bathing: Maximal assistance Where Assessed-Lower Body Bathing: Shower Upper Body Dressing: Minimal assistance Where Assessed-Upper Body Dressing: Wheelchair Lower Body Dressing: Maximal assistance Where Assessed-Lower Body Dressing: Wheelchair Toileting: Moderate assistance Where Assessed-Toileting: Glass blower/designer: Moderate assistance Toilet Transfer Method: Squat pivot Toilet Transfer Equipment: Energy manager: Moderate assistance Social research officer, government Method: Ambulating, Stand pivot Nurse, mental health Baseline Vision/History: Wears glasses Wears Glasses: Reading only Patient Visual Report: No change from baseline Vision Assessment?: Yes Eye Alignment: Within Functional Limits Ocular Range of Motion: Within Functional Limits Alignment/Gaze Preference: Within Defined Limits Tracking/Visual Pursuits: Requires cues, head turns, or add eye shifts to track Visual Fields: No apparent deficits Perception  Perception: Impaired Inattention/Neglect: Does not attend to left side of body Praxis Praxis:  Impaired Praxis Impairment Details: Motor planning Cognition Overall Cognitive Status: Impaired/Different from baseline Arousal/Alertness: Awake/alert Orientation Level: Person;Place;Situation Person: Oriented Place: Oriented Situation: Oriented Year: 2020 Month: December Day of Week: Incorrect(wed) Memory: Impaired Memory Impairment: Storage deficit;Retrieval deficit;Decreased recall of new information;Decreased short term memory Decreased Short Term Memory: Verbal basic;Functional basic Immediate Memory Recall: Sock;Blue;Bed Memory Recall Sock: Not able to recall Memory Recall Blue: Without Cue Memory Recall Bed: Not able to recall Attention: Focused;Sustained Focused Attention: Appears intact Sustained Attention: Appears intact Awareness: Impaired Awareness Impairment: Intellectual impairment Problem Solving: Impaired Problem Solving Impairment: Verbal basic;Functional basic Executive Function: Organizing;Self Monitoring Organizing: Impaired Self Monitoring: Impaired Safety/Judgment: Impaired Sensation Sensation Light Touch: Appears Intact Proprioception: Impaired by gross assessment;Impaired Detail Proprioception Impaired Details: Impaired LLE;Impaired LUE Coordination Gross Motor Movements are Fluid and Coordinated: No Fine Motor Movements are Fluid and Coordinated: No Finger Nose Finger Test: dysmetria Heel Shin Test: Impaired LLE, impaired accuracy and speed Motor  Motor Motor: Hemiplegia;Motor apraxia Motor - Skilled Clinical Observations: L hemi, UE>LE, mild motor apraxia Mobility  Bed Mobility Bed Mobility: Rolling Right;Rolling Left;Supine to Sit;Sit to Supine Rolling Right: Minimal Assistance - Patient > 75% Rolling Left: Minimal Assistance - Patient > 75% Supine to Sit: Minimal Assistance - Patient > 75% Sit to Supine: Minimal Assistance - Patient > 75% Transfers Sit to Stand: Minimal Assistance - Patient > 75% Stand to Sit: Minimal Assistance -  Patient > 75%  Trunk/Postural Assessment  Cervical Assessment Cervical Assessment: Within Functional Limits Thoracic Assessment Thoracic Assessment: Exceptions to [facility] Lumbar Assessment Lumbar Assessment: Within Functional Limits Postural Control Postural Control: Deficits on evaluation  Balance Balance Balance Assessed: Yes Static Sitting Balance Static Sitting - Level of Assistance: 5: Stand by assistance Dynamic Sitting Balance Dynamic Sitting - Level of Assistance: 4: Min assist Static Standing Balance Static Standing - Level of Assistance: 4: Min assist Dynamic Standing Balance Dynamic Standing - Level of Assistance: 3: Mod assist Extremity/Trunk Assessment RUE Assessment RUE Assessment: Within Functional Limits LUE Assessment LUE Assessment: Exceptions to [facility] LUE Body System: Neuro Brunstrum levels for arm and hand: Arm;Hand Brunstrum level for arm: Stage IV Movement is deviating from synergy Brunstrum level for hand: Stage V Independence from basic synergies     Refer to Care Plan  for Long Term Goals  Recommendations for other services: Therapeutic Recreation  Outing/community reintegration   Discharge Criteria: Patient will be discharged from OT if patient refuses treatment 3 consecutive times without medical reason, if treatment goals not met, if there is a change in medical status, if patient makes no progress towards goals or if patient is discharged from hospital.  The above assessment, treatment plan, treatment alternatives and goals were discussed and mutually agreed upon: by patient and by family  Nicoletta Ba 04/26/2019, 4:04 PM

## 2019-04-26 NOTE — Progress Notes (Signed)
Inpatient Rehabilitation  Patient information reviewed and entered into eRehab system by Lavante Toso M. Lalah Durango, M.A., CCC/SLP, PPS Coordinator.  Information including medical coding, functional ability and quality indicators will be reviewed and updated through discharge.    

## 2019-04-26 NOTE — Progress Notes (Signed)
Social Work Assessment and Plan   Patient Details  Name: Patricia Stanley MRN: [id number] Date of Birth: 31-Jan-1946  Today's Date: 04/26/2019  Problem List:  Patient Active Problem List   Diagnosis Date Noted  . Small vessel cerebrovascular accident (CVA) ([facility]) 04/25/2019  . Acute CVA (cerebrovascular accident) ([facility]) 04/18/2019  . Weight loss, unintentional 04/18/2019  . Anxiety 04/18/2019  . Major depression in partial remission ([facility]) 08/25/2012  . Osteoporosis, unspecified 08/25/2012  . HTN (hypertension) 08/25/2012  . Fall at home 08/24/2012  . Pelvic fracture ([facility]) 08/24/2012  . Migraines 08/24/2012  . Renal insufficiency, mild 08/24/2012   Past Medical History:  Past Medical History:  Diagnosis Date  . Closed fracture of coccyx ([facility]) 2015   AFTER FALL  . Depression   . GERD (gastroesophageal reflux disease)   . Headache    MIGRAINES OCCASIONAL  . History of kidney stones    HAS NOW  . Hypertension    Past Surgical History:  Past Surgical History:  Procedure Laterality Date  . ABDOMINAL HYSTERECTOMY     1 OVARY LEFT  . APPENDECTOMY  AGE 65   DONE WITH OVARY REMOVAL  . COLONOSCOPY WITH PROPOFOL N/A 12/03/2015   Procedure: COLONOSCOPY WITH PROPOFOL;  Surgeon: Charolett Bumpers, MD;  Location: [facility];  Service: Endoscopy;  Laterality: N/A;  . ENDARTERECTOMY Right 04/21/2019   Procedure: ENDARTERECTOMY CAROTID;  Surgeon: Cephus Shelling, MD;  Location: [facility] OR;  Service: Vascular;  Laterality: Right;  . ESOPHAGOGASTRODUODENOSCOPY (EGD) WITH PROPOFOL N/A 12/03/2015   Procedure: ESOPHAGOGASTRODUODENOSCOPY (EGD) WITH PROPOFOL;  Surgeon: Charolett Bumpers, MD;  Location: [facility];  Service: Endoscopy;  Laterality: N/A;  . TONSILLECTOMY  AGE 66   AND ADENOIDS REMOVED   Social History:  reports that she quit smoking about 23 years ago. She has never used smokeless tobacco. She reports that she does not drink alcohol or use drugs.  Family / Support  Systems Marital Status: Married Patient Roles: Spouse, Parent Spouse/Significant Other: Peyton Najjar [phone]  [phone] Children: Daughtrer whom lives with them and can assist Other Supports: Friends Anticipated Caregiver: Husband and daughter Ability/Limitations of Caregiver: husband works days but daughter lives there and can assist Caregiver Availability: 24/7 Family Dynamics: Close knit family who pull together in times of need. Pt has been declining for the past year and not going to MD's appointments due to stubborness. They have friends and church members who are supportive.  Social History Preferred language: English Religion: None Cultural Background: No issues Education: HS Read: Yes Write: Yes Employment Status: Retired Marine scientist Issues: None Guardian/Conservator: None-according to MD pt is capable of making her own decisions while here, husband plans to be here daily and be involved in her care while here   Abuse/Neglect Abuse/Neglect Assessment Can Be Completed: Yes Physical Abuse: Denies Verbal Abuse: Denies Sexual Abuse: Denies Exploitation of patient/patient's resources: Denies Self-Neglect: Denies  Emotional Status Pt's affect, behavior and adjustment status: Pt is motivated to get her movement back but also tired from two therapies this am. She voiced she has been getting weaker and less able to remember things the past few months. Her husband and daughter have been getting on her for not moving more and staying in the house. Recent Psychosocial Issues: Slow decline in function the past year-having work up now for this Psychiatric History: Hx depression deferred at this time due to exhaustionshe is coping appropriately and seems to be motivated to try in therapies. Do feel she would benefit from  seeing neuro-psych while here along with her history of depresison will make neuro-psych referral. Substance Abuse History: Remote tobacco no other  issues  Patient / Family Perceptions, Expectations & Goals Pt/Family understanding of illness & functional limitations: Pt and husband can explain her stroke and are hopeful she will impriove while here. They spoke with the MD while on acute and feel they have a good understanding but wonder if something else is going on and caused her decline. Premorbid pt/family roles/activities: Mom, wife, retiree, church member, friend, etc Anticipated changes in roles/activities/participation: resume Pt/family expectations/goals: Pt states: " I want to be able to do for myself and not burden my family."  Husband states: " I hope they can find what is wrong but hope she does well here." " Glad she is here and getting help."  [employer]: None Premorbid Home Care/DME Agencies: Other (Comment)(has rw, bsc, tub seat) Transportation available at discharge: Husband and family Resource referrals recommended: Neuropsychology  Discharge Planning Living Arrangements: Spouse/significant other, Children Support Systems: Spouse/significant other, Children, Friends/neighbors, Church/faith community Type of Residence: Private residence Insurance Resources: Multimedia programmer (specify)([employer] Medicare) Financial Resources: Family Support, Social Security Financial Screen Referred: No Living Expenses: Own Money Management: Spouse Does the patient have any problems obtaining your medications?: No Home Management: Family was doing this prior to admission Patient/Family Preliminary Plans: Return home with husband and daughter both together can provide 24 hr care if needed. Husband concerned regarding her decline over the past year and hope the reason can be found while here. They are focusing on her recovery from the stroke. Social Work Anticipated Follow Up Needs: HH/OP, Support Group  Clinical Impression Pleasant female who is quite thin and looks lie a failure to thrive pt. Husband is here  and supportive and voiced he and their daughter's will be assisting her at discharge. Will ask neuro-psych to see while here and work on discharge needs.  Elease Hashimoto 04/26/2019, 12:45 PM

## 2019-04-26 NOTE — Evaluation (Signed)
Speech Language Pathology Assessment and Plan  Patient Details  Name: Patricia Stanley MRN: [id number] Date of Birth: 09-04-1945  SLP Diagnosis: Cognitive Impairments  Rehab Potential: Good ELOS: 10-14 days    Today's Date: 04/26/2019 SLP Individual Time: 4696-2952 SLP Individual Time Calculation (min): 55 min   Problem List:  Patient Active Problem List   Diagnosis Date Noted  . Small vessel cerebrovascular accident (CVA) ([place]) 04/25/2019  . Acute CVA (cerebrovascular accident) ([place]) 04/18/2019  . Weight loss, unintentional 04/18/2019  . Anxiety 04/18/2019  . Major depression in partial remission ([place]) 08/25/2012  . Osteoporosis, unspecified 08/25/2012  . HTN (hypertension) 08/25/2012  . Fall at home 08/24/2012  . Pelvic fracture ([place]) 08/24/2012  . Migraines 08/24/2012  . Renal insufficiency, mild 08/24/2012   Past Medical History:  Past Medical History:  Diagnosis Date  . Closed fracture of coccyx ([place]) 2015   AFTER FALL  . Depression   . GERD (gastroesophageal reflux disease)   . Headache    MIGRAINES OCCASIONAL  . History of kidney stones    HAS NOW  . Hypertension    Past Surgical History:  Past Surgical History:  Procedure Laterality Date  . ABDOMINAL HYSTERECTOMY     1 OVARY LEFT  . APPENDECTOMY  AGE 60   DONE WITH OVARY REMOVAL  . COLONOSCOPY WITH PROPOFOL N/A 12/03/2015   Procedure: COLONOSCOPY WITH PROPOFOL;  Surgeon: Garlan Fair, MD;  Location: [facility];  Service: Endoscopy;  Laterality: N/A;  . ENDARTERECTOMY Right 04/21/2019   Procedure: ENDARTERECTOMY CAROTID;  Surgeon: Marty Heck, MD;  Location: [place];  Service: Vascular;  Laterality: Right;  . ESOPHAGOGASTRODUODENOSCOPY (EGD) WITH PROPOFOL N/A 12/03/2015   Procedure: ESOPHAGOGASTRODUODENOSCOPY (EGD) WITH PROPOFOL;  Surgeon: Garlan Fair, MD;  Location: [facility];  Service: Endoscopy;  Laterality: N/A;  . TONSILLECTOMY  AGE 27   AND ADENOIDS REMOVED    Assessment /  Plan / Recommendation Clinical Impression 73 year old right-handed female with history of hypertension, depression, migraine headaches and quit smoking 23 years ago. Patient presented 04/18/2019 with left-sided weakness x3 days with difficulty in speech and facial droop. Noted blood pressure systolic in the 841L. Cranial CT scan negative. Patient did not receive TPA. MRI of the brain showed a 1.4 cm acute infarction within the right corona radiata in addition to multiple chronic lacunar infarcts within the left frontal lobe periventricular white matter as well as bilateral basal ganglia and thalami. CTA of head and neck with extensive plaque right ICA with 65 to 70% stenosis as well as carotid Dopplers with right ICA 40 to 59% stenosis.. Patient underwent emergent right carotid enterectomy per vascular surgery Dr. Carlis Abbott 04/21/2019. Neurology services follow-up and maintained on aspirin 325 mg as well as Plavix x3 months then aspirin alone. Patient is also maintained on Lovenox for DVT prophylaxis. Echocardiogram with ejection fraction of 65% without emboli. Gastroenterology consulted 04/22/2019 for history of unintentional weight loss with CT of the abdomen showing no definite malignancy but did show pneumomediastinum and fluid in the upper mediastinum and lower right neck consistent with postsurgical changes but in addition, did show distal esophageal thickening and raising the question of esophagitis or even possibly a mass. Patient did have an endoscopy and colonoscopy 3-1/2 years ago by Dr. Wynetta Emery that showed endoscopy normal specifically without evidence of any esophagitis and with normal duodenal biopsies. The colonoscopy was also reportedly negative. Patient underwent esophagram/barium swallow showing no evidence of esophageal leak. No esophageal mass lesion evident.  Cognitive linguistic evaluation  was very limited due to priority of toileting and sanitary needs. Pt had voided (BM) in and  outside of brief, therefore SLP and NT spent majority of allotted time cleaning pt and changing items (clothing, sheets and brief.)  Pt participated in some subsections of Cognistat: demonstrated WFL for orientation (missing date only), WFL for delayed recall (recalled 3 out 4 words independently and 4 out 4 with choices) and mild sustained attention (4) however suggest due to reduced hearing acuity, sustained/selective attention was functional during informal assessment. Pt demonstrated ability to follow basic commands, and  intellectual awareness of left side weakness. Pt demonstrated no impairments in speech during conversation nor language during informal assessment only. Pt completed medication management prior to admission and would benefit from continued assessment of cognitive linguistic skills. Pt would benefit from skilled ST services in order to maximize functional independence and reduce burden of care.   Skilled Therapeutic Interventions          Skilled ST services focused on cognitive skills. SLP administered cognitive linguistic assessment, educated pt on limited results and created plan to address deficits as well continued assessment of skills. All questions were answered to satisfaction. Pt was left in room with call bell within reach and bed alarm set. ST recommends to continue skilled ST services.   SLP Assessment  Patient will need skilled Speech Lanaguage Pathology Services during CIR admission    Recommendations  SLP Diet Recommendations: Thin Liquid Administration via: Cup;Straw Medication Administration: Whole meds with liquid Supervision: Patient able to self feed Postural Changes and/or Swallow Maneuvers: Seated upright 90 degrees Follow up Recommendations: (TBD) Equipment Recommended: None recommended by SLP    SLP Frequency 3 to 5 out of 7 days   SLP Duration  SLP Intensity  SLP Treatment/Interventions 10-14 days  Minumum of 1-2 x/day, 30 to 90 minutes  Cueing  hierarchy;Cognitive remediation/compensation;Functional tasks;Medication managment    Pain Pain Assessment Pain Score: 0-No pain  Prior Functioning Cognitive/Linguistic Baseline: Information not available Type of Home: House  Lives With: Spouse;Daughter Available Help at Discharge: Family;Available 24 hours/day Education: finished 11th grade Vocation: Retired  Programmer, systems Overall Cognitive Status: Impaired/Different from baseline Arousal/Alertness: Awake/alert Orientation Level: Oriented to person;Oriented to place;Disoriented to time;Oriented to situation Attention: Sustained;Selective Focused Attention: Appears intact Sustained Attention: Appears intact Selective Attention: Appears intact Memory: Impaired(recalled 3 out 4 words, although WFL on assessment) Memory Impairment: Storage deficit;Retrieval deficit;Decreased recall of new information;Decreased short term memory Decreased Short Term Memory: Verbal basic;Functional basic Immediate Memory Recall: Sock;Blue;Bed Memory Recall Sock: Not able to recall Memory Recall Blue: Without Cue Memory Recall Bed: Not able to recall Awareness: Impaired Awareness Impairment: Intellectual impairment Problem Solving: Impaired Problem Solving Impairment: Verbal basic;Functional basic Executive Function: Organizing;Self Monitoring Organizing: Impaired Self Monitoring: Impaired Safety/Judgment: Impaired Comments: Evaluation was very limited due to tolieting needs. Pt had large BM all over self and bed.  Comprehension Auditory Comprehension Overall Auditory Comprehension: Appears within functional limits for tasks assessed Conversation: Complex Interfering Components: Hearing Expression Expression Primary Mode of Expression: Verbal Verbal Expression Overall Verbal Expression: Appears within functional limits for tasks assessed Oral Motor Oral Motor/Sensory Function Overall Oral Motor/Sensory Function: Within  functional limits Motor Speech Overall Motor Speech: Appears within functional limits for tasks assessed Intelligibility: Intelligible Motor Planning: Witnin functional limits  Short Term Goals: Week 1: SLP Short Term Goal 1 (Week 1): Pt will participate in continued cognitive lingustic assessment. SLP Short Term Goal 2 (Week 1): Pt will demonstrate semi-complex problem solving skills  in functional task of medication management with supervision A verbal cues.  Refer to Care Plan for Long Term Goals  Recommendations for other services: None   Discharge Criteria: Patient will be discharged from SLP if patient refuses treatment 3 consecutive times without medical reason, if treatment goals not met, if there is a change in medical status, if patient makes no progress towards goals or if patient is discharged from hospital.  The above assessment, treatment plan, treatment alternatives and goals were discussed and mutually agreed upon: by patient  Hae Ahlers  [facility] 04/26/2019, 4:32 PM

## 2019-04-26 NOTE — Progress Notes (Signed)
Pt to room W418 via bed. Pt is alert and oriented. Pt admitted, questions answered. Reviewed safety policy and rehab safety protocols with patient. Reviewed the visitation policy with the patient. Pt is in bed with 3 SR up VSS resting.

## 2019-04-26 NOTE — Progress Notes (Signed)
[facility] PHYSICAL MEDICINE & REHABILITATION PROGRESS NOTE   Subjective/Complaints:  No issues overnite   Appetite fair  ROS- neg CP, SOB, N/V/D  Objective:   No results found. No results for input(s): WBC, HGB, HCT, PLT in the last 72 hours. No results for input(s): NA, K, CL, CO2, GLUCOSE, BUN, CREATININE, CALCIUM in the last 72 hours. No intake or output data in the 24 hours ending 04/26/19 0602   Physical Exam: Vital Signs Blood pressure 127/63, pulse 66, temperature 98 F (36.7 C), temperature source Oral, resp. rate 16, weight 44.8 kg, SpO2 97 %.     Assessment/Plan: 1. Functional deficits secondary to RIght corona radiata infarct  which require 3+ hours per day of interdisciplinary therapy in a comprehensive inpatient rehab setting.  Physiatrist is providing close team supervision and 24 hour management of active medical problems listed below.  Physiatrist and rehab team continue to assess barriers to discharge/monitor patient progress toward functional and medical goals  Care Tool:  Bathing              Bathing assist       Upper Body Dressing/Undressing Upper body dressing        Upper body assist      Lower Body Dressing/Undressing Lower body dressing            Lower body assist       Toileting Toileting    Toileting assist Assist for toileting: Total Assistance - Patient < 25%     Transfers Chair/bed transfer  Transfers assist           Locomotion Ambulation   Ambulation assist              Walk 10 feet activity   Assist           Walk 50 feet activity   Assist           Walk 150 feet activity   Assist           Walk 10 feet on uneven surface  activity   Assist           Wheelchair     Assist               Wheelchair 50 feet with 2 turns activity    Assist            Wheelchair 150 feet activity     Assist          Blood pressure 127/63, pulse  66, temperature 98 F (36.7 C), temperature source Oral, resp. rate 16, weight 44.8 kg, SpO2 97 %.    Medical Problem List and Plan: 1.  Left-sided weakness secondary to right corona radiata infarction             -patient may shower- PT ,OT evals today             -ELOS/Goals: MinA in PT, OT, I in SLP 2.  Antithrombotics: -DVT/anticoagulation: Lovenox             -antiplatelet therapy: Aspirin 325 mg daily and Plavix 75 mg daily x3 months then aspirin alone 3. Pain Management: Hydrocodone as needed 4. Mood: Klonopin 1 mg every morning 2 mg nightly             -antipsychotic agents: N/A       depressed about being home in the hospital during Christmas. Husband plans to celebrate Christmas once patient is discharged from rehab. Advised regarding the  importance of rehab in the weeks following stroke and that we can try to facilitate [place] with her family while here.  5. Neuropsych: This patient is capable of making decisions on her own behalf. 6. Skin/Wound Care: Routine skin checks 7. Fluids/Electrolytes/Nutrition: Routine in and outs, CMET shows hypoalbuminemia ad supplement I and Os not yet recorded  8.  Symptomatic right carotid stenosis.  Status post right carotid enterectomy 04/21/2019. 9.  Hypertension.  Hydralazine 25 mg every 6 hours, Tenormin 50 mg daily, lisinopril 10 mg daily.  Monitor with increased mobility 10.  Unintentional weight loss.  Work-up per GI services.  Protonix 40 mg twice daily.  Dietary follow-up. Monitor intake may need appetite stimulant  11.  Hyperlipidemia.  Lipitor  LOS: 1 days A FACE TO FACE EVALUATION WAS PERFORMED  Charlett Blake 04/26/2019, 6:02 AM

## 2019-04-26 NOTE — Plan of Care (Signed)
  Problem: Consults Goal: Nutrition Consult-if indicated Outcome: Progressing   Problem: RH BOWEL ELIMINATION Goal: RH STG MANAGE BOWEL WITH ASSISTANCE Description: STG Manage Bowel with Mod Assistance. Outcome: Progressing Goal: RH STG MANAGE BOWEL W/MEDICATION W/ASSISTANCE Description: STG Manage Bowel with Medication with  Mod Assistance. Outcome: Progressing   Problem: RH BLADDER ELIMINATION Goal: RH STG MANAGE BLADDER WITH ASSISTANCE Description: STG Manage Bladder With  Mod Assistance Outcome: Progressing   Problem: RH SKIN INTEGRITY Goal: RH STG SKIN FREE OF INFECTION/BREAKDOWN Outcome: Progressing Goal: RH STG ABLE TO PERFORM INCISION/WOUND CARE W/ASSISTANCE Description: STG Able To Perform Incision/Wound Care With  [employer]. Outcome: Progressing   Problem: RH SAFETY Goal: RH STG ADHERE TO SAFETY PRECAUTIONS W/ASSISTANCE/DEVICE Description: STG Adhere to Safety Precautions With  Mod Assistance/Device. Outcome: Progressing   Problem: RH PAIN MANAGEMENT Goal: RH STG PAIN MANAGED AT OR BELOW PT'S PAIN GOAL Description: Pain <=3/10 Outcome: Progressing   Problem: RH KNOWLEDGE DEFICIT Goal: RH STG INCREASE KNOWLEDGE OF HYPERTENSION Outcome: Progressing Goal: RH STG INCREASE KNOWLEGDE OF HYPERLIPIDEMIA Outcome: Progressing Goal: RH STG INCREASE KNOWLEDGE OF STROKE PROPHYLAXIS Outcome: Progressing

## 2019-04-26 NOTE — Care Management (Signed)
[facility] Individual Statement of Services  Patient Name:  Patricia Stanley  Date:  04/26/2019  Welcome to the [facility].  Our goal is to provide you with an individualized program based on your diagnosis and situation, designed to meet your specific needs.  With this comprehensive rehabilitation program, you will be expected to participate in at least 3 hours of rehabilitation therapies Monday-Friday, with modified therapy programming on the weekends.  Your rehabilitation program will include the following services:  Physical Therapy (PT), Occupational Therapy (OT), Speech Therapy (ST), 24 hour per day rehabilitation nursing, Case Management (Social Worker), Rehabilitation Medicine, Nutrition Services and Pharmacy Services  Weekly team conferences will be held on Wednesday to discuss your progress.  Your Social Worker will talk with you frequently to get your input and to update you on team discussions.  Team conferences with you and your family in attendance may also be held.  Expected length of stay: 10-14 days  Overall anticipated outcome: supervision level  Depending on your progress and recovery, your program may change. Your Social Worker will coordinate services and will keep you informed of any changes. Your Social Worker's name and contact numbers are listed  below.  The following services may also be recommended but are not provided by the [facility]:    [place] will be made to provide these services after discharge if needed.  Arrangements include referral to agencies that provide these services.  Your insurance has been verified to be:  [employer] Your primary doctor is:  Josetta Huddle  Pertinent information will be shared with your doctor and your insurance company.  Social Worker:  Ovidio Kin, [place] or [phone]  Information discussed with and copy given to patient by: Elease Hashimoto, 04/26/2019, 11:33 AM

## 2019-04-26 NOTE — H&P (Signed)
Physical Medicine and Rehabilitation Admission H&P  Chief complaint: Impaired gait and mobility  HPI: Patricia Stanley is a 73 year old right-handed female with history of hypertension, depression, migraine headaches and quit smoking 23 years ago.  Per chart review she lives with her spouse.  1 level home 4 steps to entry.  By report patient ambulatory in the home but has not left the house in over a year and was very sedentary as well as reports of significant weight loss over the past year.  Patient presented 04/18/2019 with left-sided weakness x3 days with difficulty in speech and facial droop.  Noted blood pressure systolic in the 180s.  Cranial CT scan negative.  Patient did not receive TPA.  MRI of the brain showed a 1.4 cm acute infarction within the right corona radiata in addition to multiple chronic lacunar infarcts within the left frontal lobe periventricular white matter as well as bilateral basal ganglia and thalami.  CTA of head and neck with extensive plaque right ICA with 65 to 70% stenosis as well as carotid Dopplers with right ICA 40 to 59% stenosis..  Patient underwent emergent right carotid enterectomy per vascular surgery Dr. Chestine Spore 04/21/2019.  Neurology services follow-up and maintained on aspirin 325 mg as well as Plavix x3 months then aspirin alone.  Patient is also maintained on Lovenox for DVT prophylaxis.  Echocardiogram with ejection fraction of 65% without emboli.  Gastroenterology consulted 04/22/2019 for history of unintentional weight loss with CT of the abdomen showing no definite malignancy but did show pneumomediastinum and fluid in the upper mediastinum and lower right neck consistent with postsurgical changes but in addition, did show distal esophageal thickening and raising the question of esophagitis or even possibly a mass.  Patient did have an endoscopy and colonoscopy 3-1/2 years ago by Dr. Laural Benes that showed endoscopy normal specifically without evidence of any  esophagitis and with normal duodenal biopsies.  The colonoscopy was also reportedly negative.  Patient underwent esophagram/barium swallow showing no evidence of esophageal leak.  No esophageal mass lesion evident.  Therapy evaluations completed and patient was admitted for a comprehensive rehab program.  Review of Systems  Constitutional: Negative for chills and fever.  HENT: Negative for hearing loss.   Eyes: Negative for blurred vision and double vision.  Respiratory: Negative for cough and shortness of breath.   Cardiovascular: Negative for chest pain, palpitations and leg swelling.  Gastrointestinal: Positive for diarrhea, heartburn and nausea. Negative for vomiting.       GERD  Genitourinary: Negative for flank pain and hematuria.  Musculoskeletal: Positive for joint pain and myalgias.  Skin: Negative for rash.  Neurological: Positive for weakness and headaches.  Psychiatric/Behavioral: Positive for depression. The patient has insomnia.   All other systems reviewed and are negative.  Past Medical History:  Diagnosis Date  . Closed fracture of coccyx (HCC) 2015   AFTER FALL  . Depression   . GERD (gastroesophageal reflux disease)   . Headache    MIGRAINES OCCASIONAL  . History of kidney stones    HAS NOW  . Hypertension    Past Surgical History:  Procedure Laterality Date  . ABDOMINAL HYSTERECTOMY     1 OVARY LEFT  . APPENDECTOMY  AGE 76   DONE WITH OVARY REMOVAL  . COLONOSCOPY WITH PROPOFOL N/A 12/03/2015   Procedure: COLONOSCOPY WITH PROPOFOL;  Surgeon: Charolett Bumpers, MD;  Location: [facility] ENDOSCOPY;  Service: Endoscopy;  Laterality: N/A;  . ENDARTERECTOMY Right 04/21/2019   Procedure: ENDARTERECTOMY CAROTID;  Surgeon: Cephus Shelling, MD;  Location: [facility] OR;  Service: Vascular;  Laterality: Right;  . ESOPHAGOGASTRODUODENOSCOPY (EGD) WITH PROPOFOL N/A 12/03/2015   Procedure: ESOPHAGOGASTRODUODENOSCOPY (EGD) WITH PROPOFOL;  Surgeon: Charolett Bumpers, MD;  Location: [facility]  ENDOSCOPY;  Service: Endoscopy;  Laterality: N/A;  . TONSILLECTOMY  AGE 76   AND ADENOIDS REMOVED   History reviewed. No pertinent family history. Social History:  reports that she quit smoking about 23 years ago. She has never used smokeless tobacco. She reports that she does not drink alcohol or use drugs. Allergies:  Allergies  Allergen Reactions  . Paroxetine Other (See Comments)    Shakes-tremors   Medications Prior to Admission  Medication Sig Dispense Refill  . acetaminophen (TYLENOL) 325 MG tablet Take 2 tablets (650 mg total) by mouth every 4 (four) hours as needed for mild pain (or temp > 37.5 C (99.5 F)). 30 tablet 0  . aspirin 325 MG tablet Take 1 tablet (325 mg total) by mouth daily. 30 tablet 0  . atenolol (TENORMIN) 50 MG tablet Take 50 mg by mouth every evening.     [name] atorvastatin (LIPITOR) 80 MG tablet Take 1 tablet (80 mg total) by mouth daily at 6 PM. 30 tablet 0  . clonazePAM (KLONOPIN) 1 MG tablet Take 1 tablet (1 mg total) by mouth every morning. 30 tablet 0  . clonazePAM (KLONOPIN) 2 MG tablet Take 1 tablet (2 mg total) by mouth at bedtime. 30 tablet 0  . clopidogrel (PLAVIX) 75 MG tablet Take 1 tablet (75 mg total) by mouth daily. 30 tablet 0  . hydrALAZINE (APRESOLINE) 25 MG tablet Take 1 tablet (25 mg total) by mouth every 6 (six) hours. 120 tablet 0  . HYDROcodone-acetaminophen (NORCO/VICODIN) 5-325 MG tablet Take 1-2 tablets by mouth every 4 (four) hours as needed for moderate pain. 20 tablet 0  . lisinopril (ZESTRIL) 10 MG tablet Take 1 tablet (10 mg total) by mouth daily. 30 tablet 0  . metoprolol tartrate (LOPRESSOR) 25 MG tablet Take 1 tablet (25 mg total) by mouth 2 (two) times daily. 60 tablet 11  . pantoprazole (PROTONIX) 40 MG tablet Take 1 tablet (40 mg total) by mouth 2 (two) times daily before a meal. 60 tablet 0  . polyethylene glycol (MIRALAX / GLYCOLAX) 17 g packet Take 17 g by mouth daily as needed for mild constipation. 14 each 0    Drug  Regimen Review Drug regimen was reviewed and remains appropriate with no significant issues identified    Home: Home Living Family/patient expects to be discharged to:: Private residence Living Arrangements: Spouse/significant other Available Help at Discharge: Family, Available 24 hours/day Type of Home: House Home Access: Stairs to enter [employer] of Steps: 4 Entrance Stairs-Rails: Can reach both Home Layout: One level Bathroom Shower/Tub: Engineer, manufacturing systems: Standard Home Equipment: Environmental consultant - 2 wheels, Bedside commode, Shower seat, Grab bars - tub/shower, Hand held shower head  Lives With: Spouse   Functional History: Prior Function Level of Independence: Needs assistance Gait / Transfers Assistance Needed: didn't use AD ADL's / Homemaking Assistance Needed: family assist pt into shower and on the chair,  Communication / Swallowing Assistance Needed: spouse states her mind isn't as fast as it used to be, she's had a hard time explaining things to me Comments: spouse states she hasn't been out of the house in a  year  Functional Status:  Mobility: Bed Mobility Overal bed mobility: Needs Assistance Bed Mobility: Supine to Sit, Sit to Supine  Rolling: Min guard Sidelying to sit: Min assist Supine to sit: Min assist Sit to supine: Min assist General bed mobility comments: Pt required assistance to roll to R side and elevate trunk into sitting.  Pt with heavy reliance on railing to move to sitting.  Cues for LUE awareness but is moving LUE and LE better this session. Transfers Overall transfer level: Needs assistance Equipment used: Rolling walker (2 wheeled) Transfers: Sit to/from Stand, [employer] Transfers Sit to Stand: Min assist Stand pivot transfers: Mod assist General transfer comment: SPT without RW Ambulation/Gait Ambulation/Gait assistance: Mod assist Gait Distance (Feet): 8 Feet(3 trials of 8') Assistive device: Rolling walker (2  wheeled) Gait Pattern/deviations: Step-to pattern, Scissoring General Gait Details: pt with short step to gait with inconsistent placement of L foot, intermittent scissoring. PT providing verbal and visual cues to increase width of base of support Gait velocity: reduced Gait velocity interpretation: <1.31 ft/sec, indicative of household ambulator  ADL: ADL Overall ADL's : Needs assistance/impaired Eating/Feeding: Set up, Sitting Eating/Feeding Details (indicate cue type and reason): assist cutting up food, opening containers as patient is L hand dominant and having to use mainly R hand for self care tasks Grooming: Oral care, Wash/dry face, Sitting, Minimal assistance Grooming Details (indicate cue type and reason): set up initially, min A for trunk control and to correct posture in order to spit into basin Upper Body Bathing: Sitting, Moderate assistance Upper Body Bathing Details (indicate cue type and reason): due to deficits in L UE Lower Body Bathing: Sitting/lateral leans, Total assistance Lower Body Bathing Details (indicate cue type and reason): decreased trunk control Upper Body Dressing : Moderate assistance, Sitting Upper Body Dressing Details (indicate cue type and reason): due to limited trunk control and patient limited use of dominant hand Lower Body Dressing: Total assistance, Sitting/lateral leans Lower Body Dressing Details (indicate cue type and reason): limited trunk control, limited use of L UE Toilet Transfer: +2 for physical assistance, +2 for safety/equipment, Stand-pivot, BSC Toilet Transfer Details (indicate cue type and reason): did not attempt this session as patient's breakfast arrived, anticipate assist of 2 for transfers for safety as patient has limited core stabilization  Toileting- Clothing Manipulation and Hygiene: Total assistance, Sitting/lateral lean General ADL Comments: min A for UB ADL sitting supported in seat, difficulty with trunk  control  Cognition: Cognition Overall Cognitive Status: Impaired/Different from baseline Arousal/Alertness: Awake/alert Orientation Level: Oriented X4 Attention: Focused, Sustained Focused Attention: Appears intact Sustained Attention: Impaired Sustained Attention Impairment: Verbal basic, Functional basic Memory: Impaired Memory Impairment: Storage deficit, Retrieval deficit, Decreased recall of new information, Decreased short term memory Decreased Short Term Memory: Verbal basic, Functional basic Awareness: Impaired Awareness Impairment: Intellectual impairment Problem Solving: Impaired Problem Solving Impairment: Verbal basic, Functional basic Executive Function: Organizing, Self Monitoring Organizing: Impaired Self Monitoring: Impaired Safety/Judgment: Impaired Cognition Arousal/Alertness: Awake/alert Behavior During Therapy: WFL for tasks assessed/performed Overall Cognitive Status: Impaired/Different from baseline Area of Impairment: Memory Memory: Decreased short-term memory Following Commands: Follows one step commands with increased time Safety/Judgement: Decreased awareness of safety, Decreased awareness of deficits Awareness: Intellectual Problem Solving: Slow processing, Decreased initiation, Requires verbal cues  Physical Exam: Blood pressure 127/63, pulse 66, temperature 98 F (36.7 C), temperature source Oral, resp. rate 16, weight 44.8 kg, SpO2 97 %. General: Alert and oriented x 3, Crying because she will not be home for Christmas HEENT: Head is normocephalic, atraumatic, PERRLA, EOMI, sclera anicteric, oral mucosa pink and moist, dentition intact, ext ear canals clear,  Neck: Supple without  JVD or lymphadenopathy Heart: Reg rate and rhythm. No murmurs rubs or gallops Chest: CTA bilaterally without wheezes, rales, or rhonchi; no distress Abdomen: Soft, non-tender, non-distended, bowel sounds positive. Extremities: No clubbing, cyanosis, or edema. Pulses  are 2+ Skin: Clean and intact without signs of breakdown Neuro: Patient is alert resting comfortably in no acute distress.  Makes good eye contact with examiner.  Provides her name age and date of birth.  [place].  Musculoskeletal:  4/5 strength in RUE, RLE, and LLE 3/5 strength in LUE Psych: Pt's affect is appropriate. Pt is cooperative  Results for orders placed or performed during the hospital encounter of 04/25/19 (from the past 48 hour(s))  CBC WITH DIFFERENTIAL     Status: Abnormal   Collection Time: 04/26/19  5:26 AM  Result Value Ref Range   WBC 4.3 4.0 - 10.5 K/uL   RBC 3.22 (L) 3.87 - 5.11 MIL/uL   Hemoglobin 9.9 (L) 12.0 - 15.0 g/dL   HCT 31.1 (L) 36.0 - 46.0 %   MCV 96.6 80.0 - 100.0 fL   MCH 30.7 26.0 - 34.0 pg   MCHC 31.8 30.0 - 36.0 g/dL   RDW 12.9 11.5 - 15.5 %   Platelets 123 (L) 150 - 400 K/uL   nRBC 0.0 0.0 - 0.2 %   Neutrophils Relative % 44 %   Neutro Abs 1.9 1.7 - 7.7 K/uL   Lymphocytes Relative 35 %   Lymphs Abs 1.5 0.7 - 4.0 K/uL   Monocytes Relative 10 %   Monocytes Absolute 0.4 0.1 - 1.0 K/uL   Eosinophils Relative 10 %   Eosinophils Absolute 0.5 0.0 - 0.5 K/uL   Basophils Relative 1 %   Basophils Absolute 0.0 0.0 - 0.1 K/uL   Immature Granulocytes 0 %   Abs Immature Granulocytes 0.01 0.00 - 0.07 K/uL    Comment: Performed at [facility], [address], [place], [place] [address]  Comprehensive metabolic panel     Status: Abnormal   Collection Time: 04/26/19  5:26 AM  Result Value Ref Range   Sodium 142 135 - 145 mmol/L   Potassium 4.1 3.5 - 5.1 mmol/L   Chloride 105 98 - 111 mmol/L   CO2 27 22 - 32 mmol/L   Glucose, Bld 96 70 - 99 mg/dL   BUN 15 8 - 23 mg/dL   Creatinine, Ser 0.91 0.44 - 1.00 mg/dL   Calcium 9.0 8.9 - 10.3 mg/dL   Total Protein 5.3 (L) 6.5 - 8.1 g/dL   Albumin 2.7 (L) 3.5 - 5.0 g/dL   AST 41 15 - 41 U/L   ALT 28 0 - 44 U/L   Alkaline Phosphatase 74 38 - 126 U/L   Total Bilirubin 0.5 0.3 - 1.2 mg/dL    GFR calc non Af Amer >60 >60 mL/min   GFR calc Af Amer >60 >60 mL/min   Anion gap 10 5 - 15    Comment: Performed at [facility] [address] [place], [place] [address]   No results found.  Medical Problem List and Plan: 1.  Left-sided weakness secondary to right corona radiata infarction  -patient may shower  -ELOS/Goals: MinA in PT, OT, I in SLP 2.  Antithrombotics: -DVT/anticoagulation: Lovenox  -antiplatelet therapy: Aspirin 325 mg daily and Plavix 75 mg daily x3 months then aspirin alone 3. Pain Management: Hydrocodone as needed 4. Mood: Klonopin 1 mg every morning 2 mg nightly  -antipsychotic agents: N/A  She is  depressed about being home in the hospital during Christmas. Husband plans to celebrate Christmas once patient is discharged from rehab. Advised regarding the importance of rehab in the weeks following stroke and that we can try to facilitate FaceTiming with her family while here.  5. Neuropsych: This patient is capable of making decisions on her own behalf. 6. Skin/Wound Care: Routine skin checks 7. Fluids/Electrolytes/Nutrition: Routine in and outs with follow-up chemistries 8.  Symptomatic right carotid stenosis.  Status post right carotid enterectomy 04/21/2019. 9.  Hypertension.  Hydralazine 25 mg every 6 hours, Tenormin 50 mg daily, lisinopril 10 mg daily.  Monitor with increased mobility 10.  Unintentional weight loss.  Work-up per GI services.  Protonix 40 mg twice daily.  Dietary follow-up. Husband says she has been eating better in the hospital than she was at home. 11.  Hyperlipidemia.  Lipitor  Mcarthur RossettiDaniel J Angiulli, PA-C 04/25/2019   I have personally performed a face to face diagnostic evaluation, including, but not limited to relevant history and physical exam findings, of this patient and developed relevant assessment and plan.  Additionally, I have reviewed and concur with the physician assistant's documentation above.  The patient's status has  not changed. The original post admission physician evaluation remains appropriate, and any changes from the pre-admission screening or documentation from the acute chart are noted above.   Sula SodaKrutika Rakeb Kibble, MD

## 2019-04-27 ENCOUNTER — Inpatient Hospital Stay (HOSPITAL_COMMUNITY): Payer: Medicare HMO | Admitting: Physical Therapy

## 2019-04-27 ENCOUNTER — Inpatient Hospital Stay (HOSPITAL_COMMUNITY): Payer: Medicare HMO | Admitting: Speech Pathology

## 2019-04-27 ENCOUNTER — Inpatient Hospital Stay (HOSPITAL_COMMUNITY): Payer: Medicare HMO

## 2019-04-27 MED ORDER — ADULT MULTIVITAMIN W/MINERALS CH
1.0000 | ORAL_TABLET | Freq: Every day | ORAL | Status: DC
  Administered 2019-04-28 – 2019-05-10 (×13): 1 via ORAL
  Filled 2019-04-27 (×12): qty 1

## 2019-04-27 MED ORDER — ENSURE ENLIVE PO LIQD
237.0000 mL | Freq: Two times a day (BID) | ORAL | Status: DC
  Administered 2019-04-27 – 2019-05-09 (×22): 237 mL via ORAL

## 2019-04-27 NOTE — Progress Notes (Signed)
Pt c/o 7/10 lower back pain. Pt was given medication and repositioned in bed. Pt is resting in bed at this time, no signs of distress noted. Call light in reach.

## 2019-04-27 NOTE — Progress Notes (Signed)
Occupational Therapy Session Note  Patient Details  Name: Patricia Stanley MRN: [id number] Date of Birth: 11-Feb-1946  Today's Date: 04/27/2019 OT Individual Time: 1445-1555 OT Individual Time Calculation (min): 70 min    Short Term Goals: Week 1:  OT Short Term Goal 1 (Week 1): Pt will perform toilet transfer with min A OT Short Term Goal 2 (Week 1): Pt will perform toileting with mod A OT Short Term Goal 3 (Week 1): Pt will utlize left hand at diminished level during ADLs with min cues OT Short Term Goal 4 (Week 1): Pt will don shirt with min A  Skilled Therapeutic Interventions/Progress Updates:    1;1. Pt received in bed agreeable to OT after encouragement d/t pt fatigued. Pt completes supine>sitting with S overall and stand pivot transfer with MIN A and no AD. Pt completes oral care at sink with HOH A to maintain grasp on toothbrush and support at elbow d/t decreased shoulder flexion endurance. Pt changes shirt with with S overall and VC for hemi dressing technqiues. Pt completes reaching activity/FMC activity manipulating blocks and reaching across midline and laterally to place into bin with tactile input at scapula. Pt requires 3/4# wrist weight to attempt to decrease ataxic movement, however minimal improvement with fluidity of motion noted. Protraction and retraction exercises for LUE completed to strengthen L scap d/t mild winging noted during reaching activity, however pt with difficulty motorplanning attempting to elevate/drepress scap. Exited session with pt seated in bed, exit alarm on and call light in reach  Therapy Documentation Precautions:  Precautions Precautions: Fall Precaution Comments: L hemi Restrictions Weight Bearing Restrictions: No General:   Vital Signs: Therapy Vitals Temp: 98.2 F (36.8 C) Pulse Rate: 79 Resp: 17 BP: 117/79 Patient Position (if appropriate): Sitting Oxygen Therapy SpO2: 100 % O2 Device: Room Air Pain:   ADL: ADL Grooming:  Maximal assistance Where Assessed-Grooming: Sitting at sink Upper Body Bathing: Minimal assistance Where Assessed-Upper Body Bathing: Shower Lower Body Bathing: Maximal assistance Where Assessed-Lower Body Bathing: Shower Upper Body Dressing: Minimal assistance Where Assessed-Upper Body Dressing: Wheelchair Lower Body Dressing: Maximal assistance Where Assessed-Lower Body Dressing: Wheelchair Toileting: Moderate assistance Where Assessed-Toileting: Glass blower/designer: Moderate assistance Toilet Transfer Method: Squat pivot Toilet Transfer Equipment: Energy manager: Moderate assistance Social research officer, government Method: Ambulating, Stand pivot Medical sales representative    Praxis   Exercises:   Other Treatments:     Therapy/Group: Individual Therapy  Tonny Branch 04/27/2019, 3:03 PM

## 2019-04-27 NOTE — Progress Notes (Signed)
Physical Therapy Session Note  Patient Details  Name: Patricia Stanley MRN: [id number] Date of Birth: [birth year]  Today's Date: 04/27/2019 PT Individual Time: 0850-0930 AND 1304 1400 PT Individual Time Calculation (min): 40 min and 56 min   Short Term Goals: Week 1:  PT Short Term Goal 1 (Week 1): Pt will initiate stair training PT Short Term Goal 2 (Week 1): Pt will ambulate 25' w/ min assist PT Short Term Goal 3 (Week 1): Pt will perform stand pivot w/ min assist PT Short Term Goal 4 (Week 1): Pt will maintain dynamic standing balance w/ min assist  Skilled Therapeutic Interventions/Progress Updates:  Session 1  Pt received supine in bed and agreeable to PT. Supine>sit transfer with mod assist and cues for proper use of UE to push, rather then pull on PT.   Stand pivot transfer to the R with min assist. Pt transported to rehab gym in [facility]. Sit<>stand from [place] x 5 with min assist and min cues for proper UE placement to push from [facility] on R side. Pt reports mild orthostatic s/s. PT obtained orthostatic BP sitting 156/87. Standing 136/77. PT applied Ted hose to BLE.   Pre gait stepping to target on flat surface x 8 BLE and stepping to 2 inch step x 6 BLE. Cues for posture and improved hip flexor activation. Pt then performed  Gait training with RW 2 x 37f with min-mod assist due to RLE ataxia with cues for improved hip/knee flexion with limb advancement.   [facility], RN present, and all needs met.     Session 2.    Pt received sitting in WC and agreeable to PT. [facility]   Stair management training with BUE supported on rails x 2 3" step and x 2 6" step. Min assist overall with moderate cues for step to gait pattern and increased step width on the L.   Gait training with RW and 1# weight on ankle. X 375fwith min assist. Moderate cues for step width and safety with AD management in turns. Improved  coordination noted with weight added to the LLE.   PT instructed pt in coordinated training to tap BLE on target 2x 8 BLE with min cues for increased step width and improved L gluteal activation to prevent scissoring.   Nustep reciprocal UE and LE movement training level 4  5 min + 3 min with min cues for improve hip/knee alignment to prevent excessive IR In the L hip. Stand pivot transfer on/off of nustep with min assist and min cues for gait pattern and posture.       Therapy Documentation Precautions:  Precautions Precautions: Fall Precaution Comments: L hemi Restrictions Weight Bearing Restrictions: No Vital Signs: Therapy Vitals BP: (!) 146/76 Pain:   L shoulder: 5/10. RN aware. See MAR for medication.     Therapy/Group: Individual Therapy  AuLorie Phenix2/23/2020, 9:33 AM

## 2019-04-27 NOTE — Progress Notes (Signed)
Social Work Patient ID: Patricia Stanley, female   DOB: Feb 03, 1946, 73 y.o.   MRN: [id number]  Met with pt and husband to discuss team conference goals supervision level and target discharge date 1/5. She voiced this is tiring ehr out and will be glad to be going home soon. Her husband does not say much while in the room. Discussed with him he and daughter will need to attend therapies prior to discharge home. He voiced they will. Continue to work on discharge needs.

## 2019-04-27 NOTE — Patient Care Conference (Signed)
Inpatient RehabilitationTeam Conference and Plan of Care Update Date: 04/27/2019   Time: 10:10 AM    Patient Name: Patricia Stanley      Medical Record Number: [id number]  Date of Birth: [birth year] Sex: Female         Room/Bed: 4W18C/4W18C-01 Payor Info: Payor: [employer] MEDICARE / Plan: [place] [employer] / Product Type: *No Product type* /    Admit Date/Time:  04/25/2019 10:40 PM  Primary Diagnosis:  <principal problem not specified>  Patient Active Problem List   Diagnosis Date Noted  . Small vessel cerebrovascular accident (CVA) ([place]) 04/25/2019  . Acute CVA (cerebrovascular accident) ([place]) 04/18/2019  . Weight loss, unintentional 04/18/2019  . Anxiety 04/18/2019  . Major depression in partial remission ([place]) 08/25/2012  . Osteoporosis, unspecified 08/25/2012  . HTN (hypertension) 08/25/2012  . Fall at home 08/24/2012  . Pelvic fracture ([place]) 08/24/2012  . Migraines 08/24/2012  . Renal insufficiency, mild 08/24/2012    Expected Discharge Date: Expected Discharge Date: 05/10/19  Team Members Present: Physician leading conference: Dr. Alysia Penna Social Worker Present: Ovidio Kin, LCSW Nurse Present: Rozetta Nunnery, RN Case Manager: Karene Fry, RN PT Present: Barrie Folk, PT;Rosita Dechalus, PTA OT Present: Laverle Hobby, OT SLP Present: Stormy Fabian, SLP PPS Coordinator present : Gunnar Fusi, SLP     Current Status/Progress Goal Weekly Team Focus  Bowel/Bladder   Pt is continent/incontinet at times. LBM 04/26/19  Pt will be continent x2  Q2h/ PRn toileting.   Swallow/Nutrition/ Hydration             ADL's   mod A for transfers, max A for UB dressing and LB dressing, bathing at shower level wtih min A, standing balance with min to mod A  supervision overall  NMR for left UE, standing balance ADL retraining, endurance/ activity tolerance   Mobility   min-mod assist overall, gait 10' w/ or w/o RW, L hemi UE>LE  supervision overall, household mobility, except  CGA 4 steps w/ unilateral rail  LLE NMR, balance, proprioception, gait   Communication             Safety/Cognition/ Behavioral Observations  semi-complex problem solving (medication) Min- Sup A? very limited evaluation  Mod I  medication management, high level awareness?   Pain   Pt c/o 7/10 Lower back pain. PRN medication given nad effective.  Pain will be less than 4  Assess pain Qshift/PRN   Skin   Pt has bruising to the R side. Incision to R neck, w/skin glue. no signs of drainage or infection, bruising around the site.  Pt will be free of skin breakdown and infection.  Assess skin Qshift/PRN      *See Care Plan and progress notes for long and short-term goals.     Barriers to Discharge  Current Status/Progress Possible Resolutions Date Resolved   Nursing                  PT  Medical stability                 OT                  SLP                SW                Discharge Planning/Teaching Needs:  Home with husband and daughter who between them can provide 24 hr care. Pt has been declining for a year according  to husband. Will ask neuro-psych to see      Team Discussion: CEA after CVA, post op anemia, blood in stool reported, on anticoagulants, check stool for blood, low protein, encourage intake, anxiety.  RN - reg/thins, pain med given, neck incision looks good.  OT mod a transfers, max UB/LB dressing, bathing min A at shower level, S goals.  PT min/mod bed, min a stand pivot, amb 25' min/mod A with ataxia, goals S, stair goals CGA.  SLP word finding deficits.  Per MD several old strokes.  Husband/dtr can do 24/7 S.   Revisions to Treatment Plan: N/A     Medical Summary Current Status: poor nutrition, BP control ok Weekly Focus/Goal: initiate rehab program, improve protein stores  Barriers to Discharge: Medical stability   Possible Resolutions to Barriers: cont rehab, encourage po intake ,assessment of anemia   Continued Need for Acute Rehabilitation Level of  Care: The patient requires daily medical management by a physician with specialized training in physical medicine and rehabilitation for the following reasons: Direction of a multidisciplinary physical rehabilitation program to maximize functional independence : Yes Medical management of patient stability for increased activity during participation in an intensive rehabilitation regime.: Yes Analysis of laboratory values and/or radiology reports with any subsequent need for medication adjustment and/or medical intervention. : Yes   I attest that I was present, lead the team conference, and concur with the assessment and plan of the team.   Trish Mage 04/27/2019, 5:23 PM  Team conference was held via web/ teleconference due to COVID - 19

## 2019-04-27 NOTE — Progress Notes (Signed)
Speech Language Pathology Daily Session Note  Patient Details  Name: Patricia Stanley MRN: [id number] Date of Birth: 07/11/1945  Today's Date: 04/27/2019 SLP Individual Time: 0930-1000 SLP Individual Time Calculation (min): 30 min  Short Term Goals: Week 1: SLP Short Term Goal 1 (Week 1): Pt will participate in continued cognitive lingustic assessment. SLP Short Term Goal 2 (Week 1): Pt will demonstrate semi-complex problem solving skills in functional task of medication management with supervision A verbal cues.  Skilled Therapeutic Interventions:  Skilled treatment session targeted completion of the Cognistat. Deficits in language noted, largely related to multiple chronic lacunar infarcts within the left frontal lobe periventricular white matter - pt is LEFT handed-  she is however able to communicate basic wants and needs functionally. Will target medication management (pt reported independence with taking medicines) at next session.     Pain    Therapy/Group: Individual Therapy  Talicia Sui 04/27/2019, 12:58 PM

## 2019-04-27 NOTE — IPOC Note (Signed)
Overall Plan of Care [facility]) Patient Details Name: Patricia Stanley MRN: [id number] DOB: 1946-03-14  Admitting Diagnosis: <principal problem not specified>  Hospital Problems: Active Problems:   Small vessel cerebrovascular accident (CVA) (HCC)     Functional Problem List: Nursing Bowel, Pain, Endurance, Skin Integrity, Medication Management, Safety  PT Balance, Endurance, Motor, Safety, Sensory  OT Balance, Behavior, Cognition, Endurance, Motor, Pain, Safety, Sensory, Perception  SLP Cognition  TR         Basic ADL's: OT Grooming, Bathing, Dressing     Advanced  ADL's: OT       Transfers: PT Floor, Bed Mobility, Bed to Chair, Car, Occupational psychologist, Research scientist (life sciences): PT Stairs, Psychologist, prison and probation services, Ambulation     Additional Impairments: OT Fuctional Use of Upper Extremity  SLP Social Cognition   Problem Solving  TR      Anticipated Outcomes Item Anticipated Outcome  Self Feeding n/a  Swallowing      Basic self-care  supervision  Toileting  supervision   Bathroom Transfers supervision  Bowel/Bladder  continent of bowel free of constipation episodes of incontinence pt states had been happening at home  Transfers  supervision  Locomotion  supervision household gait w/ LRAD  Communication     Cognition  Mod I  Pain  pt will have minimal pain  Safety/Judgment  pt will remain safe with use of bed alarms and standard safety measures for rehab   Therapy Plan: PT Intensity: Minimum of 1-2 x/day ,45 to 90 minutes PT Frequency: 5 out of 7 days PT Duration Estimated Length of Stay: 10-14 days OT Intensity: Minimum of 1-2 x/day, 45 to 90 minutes OT Frequency: 5 out of 7 days OT Duration/Estimated Length of Stay: ~14 days SLP Intensity: Minumum of 1-2 x/day, 30 to 90 minutes SLP Frequency: 3 to 5 out of 7 days SLP Duration/Estimated Length of Stay: 10-14 days   Due to the current state of emergency, patients may not be receiving their 3-hours  of Medicare-mandated therapy.   Team Interventions: Nursing Interventions Patient/Family Education, Pain Management, Bladder Management, Medication Management, Discharge Planning, Bowel Management, Skin Care/Wound Management, Psychosocial Support, Disease Management/Prevention  PT interventions Ambulation/gait training, Cognitive remediation/compensation, Discharge planning, DME/adaptive equipment instruction, Functional mobility training, Pain management, Psychosocial support, Splinting/orthotics, Therapeutic Activities, UE/LE Strength taining/ROM, Visual/perceptual remediation/compensation, Wheelchair propulsion/positioning, UE/LE Coordination activities, Therapeutic Exercise, Skin care/wound management, Stair training, Patient/family education, Functional electrical stimulation, Neuromuscular re-education, Disease management/prevention, Firefighter, Warden/ranger  OT Interventions Warden/ranger, Discharge planning, Functional electrical stimulation, Pain management, Self Care/advanced ADL retraining, Therapeutic Activities, UE/LE Coordination activities, Cognitive remediation/compensation, Disease mangement/prevention, Functional mobility training, Patient/family education, Skin care/wound managment, Therapeutic Exercise, Community reintegration, Fish farm manager, Neuromuscular re-education, Psychosocial support, Splinting/orthotics, UE/LE Strength taining/ROM  SLP Interventions Cueing hierarchy, Cognitive remediation/compensation, Functional tasks, Medication managment  TR Interventions    SW/CM Interventions Discharge Planning, Psychosocial Support, Patient/Family Education   Barriers to Discharge MD  Medical stability  Nursing      PT Medical stability    OT      SLP      SW       Team Discharge Planning: Destination: PT-Home ,OT- Home , SLP-  Projected Follow-up: PT-Home health PT, OT-  Home health OT, SLP-(TBD) Projected  Equipment Needs: PT-To be determined, OT- To be determined, SLP-None recommended by SLP Equipment Details: PT- , OT-  Patient/family involved in discharge planning: PT- Patient,  OT-Patient, SLP-Patient  MD ELOS: 10-14d Medical Rehab Prognosis:  Good  Assessment:  73 year old right-handed female with history of hypertension, depression, migraine headaches and quit smoking 23 years ago.  Per chart review she lives with her spouse.  1 level home 4 steps to entry.  By report patient ambulatory in the home but has not left the house in over a year and was very sedentary as well as reports of significant weight loss over the past year.  Patient presented 04/18/2019 with left-sided weakness x3 days with difficulty in speech and facial droop.  Noted blood pressure systolic in the 295J.  Cranial CT scan negative.  Patient did not receive TPA.  MRI of the brain showed a 1.4 cm acute infarction within the right corona radiata in addition to multiple chronic lacunar infarcts within the left frontal lobe periventricular white matter as well as bilateral basal ganglia and thalami.  CTA of head and neck with extensive plaque right ICA with 65 to 70% stenosis as well as carotid Dopplers with right ICA 40 to 59% stenosis..  Patient underwent emergent right carotid enterectomy per vascular surgery Dr. Carlis Abbott 04/21/2019.  Neurology services follow-up and maintained on aspirin 325 mg as well as Plavix x3 months then aspirin alone.  Patient is also maintained on Lovenox for DVT prophylaxis.  Echocardiogram with ejection fraction of 65% without emboli.  Gastroenterology consulted 04/22/2019 for history of unintentional weight loss with CT of the abdomen showing no definite malignancy but did show pneumomediastinum and fluid in the upper mediastinum and lower right neck consistent with postsurgical changes but in addition, did show distal esophageal thickening and raising the question of esophagitis or even possibly a mass.  Patient  did have an endoscopy and colonoscopy 3-1/2 years ago by Dr. Wynetta Emery that showed endoscopy normal specifically without evidence of any esophagitis and with normal duodenal biopsies.  The colonoscopy was also reportedly negative.  Patient underwent esophagram/barium swallow showing no evidence of esophageal leak.   Now requiring 24/7 Rehab RN,MD, as well as CIR level PT, OT and SLP.  Treatment team will focus on ADLs and mobility with goals set at sup See Team Conference Notes for weekly updates to the plan of care

## 2019-04-27 NOTE — Progress Notes (Signed)
[facility] PHYSICAL MEDICINE & REHABILITATION PROGRESS NOTE   Subjective/Complaints:  No pains, no nausea, trying to eat more. Discussed Hgb, pt has noted a little blood in stool   ROS- neg CP, SOB, N/V/D  Objective:   No results found. Recent Labs    04/26/19 0526  WBC 4.3  HGB 9.9*  HCT 31.1*  PLT 123*   Recent Labs    04/26/19 0526  NA 142  K 4.1  CL 105  CO2 27  GLUCOSE 96  BUN 15  CREATININE 0.91  CALCIUM 9.0    Intake/Output Summary (Last 24 hours) at 04/27/2019 0824 Last data filed at 04/26/2019 1830 Gross per 24 hour  Intake 650 ml  Output --  Net 650 ml     Physical Exam: Vital Signs Blood pressure (!) 148/73, pulse 72, temperature 98.2 F (36.8 C), resp. rate 17, weight 44.8 kg, SpO2 100 %.     Assessment/Plan: 1. Functional deficits secondary to RIght corona radiata infarct  which require 3+ hours per day of interdisciplinary therapy in a comprehensive inpatient rehab setting.  Physiatrist is providing close team supervision and 24 hour management of active medical problems listed below.  Physiatrist and rehab team continue to assess barriers to discharge/monitor patient progress toward functional and medical goals  Care Tool:  Bathing    Body parts bathed by patient: Right arm, Left arm, Chest, Abdomen, Front perineal area, Right upper leg, Left upper leg, Right lower leg, Face   Body parts bathed by helper: Buttocks, Left lower leg     Bathing assist Assist Level: Contact Guard/Touching assist     Upper Body Dressing/Undressing Upper body dressing   What is the patient wearing?: Pull over shirt    Upper body assist Assist Level: Maximal Assistance - Patient 25 - 49%    Lower Body Dressing/Undressing Lower body dressing      What is the patient wearing?: Incontinence brief, Pants     Lower body assist Assist for lower body dressing: Maximal Assistance - Patient 25 - 49%     Toileting Toileting    Toileting assist  Assist for toileting: Minimal Assistance - Patient > 75%     Transfers Chair/bed transfer  Transfers assist     Chair/bed transfer assist level: Moderate Assistance - Patient 50 - 74%     Locomotion Ambulation   Ambulation assist      Assist level: Moderate Assistance - Patient 50 - 74% Assistive device: Hand held assist Max distance: 10'   Walk 10 feet activity   Assist     Assist level: Moderate Assistance - Patient - 50 - 74% Assistive device: Hand held assist   Walk 50 feet activity   Assist Walk 50 feet with 2 turns activity did not occur: Safety/medical concerns         Walk 150 feet activity   Assist Walk 150 feet activity did not occur: Safety/medical concerns         Walk 10 feet on uneven surface  activity   Assist Walk 10 feet on uneven surfaces activity did not occur: Safety/medical concerns         Wheelchair     Assist Will patient use wheelchair at discharge?: (TBD) Type of Wheelchair: Manual    Wheelchair assist level: Moderate Assistance - Patient 50 - 74% Max wheelchair distance: 10'    Wheelchair 50 feet with 2 turns activity    Assist    Wheelchair 50 feet with 2 turns activity did  not occur: Safety/medical concerns       Wheelchair 150 feet activity     Assist  Wheelchair 150 feet activity did not occur: Safety/medical concerns       Blood pressure (!) 148/73, pulse 72, temperature 98.2 F (36.8 C), resp. rate 17, weight 44.8 kg, SpO2 100 %.    Medical Problem List and Plan: 1.  Left-sided weakness secondary to right corona radiata infarction Team conference today please see physician documentation under team conference tab, met with team face-to-face to discuss problems,progress, and goals. Formulized individual treatment plan based on medical history, underlying problem and comorbidities.              -patient may shower- PT ,OT  Team conference today please see physician documentation  under team conference tab, met with team face-to-face to discuss problems,progress, and goals. Formulized individual treatment plan based on medical history, underlying problem and comorbidities.              -ELOS/Goals: MinA in PT, OT, I in SLP 2.  Antithrombotics: -DVT/anticoagulation: Lovenox             -antiplatelet therapy: Aspirin 325 mg daily and Plavix 75 mg daily x3 months then aspirin alone 3. Pain Management: Hydrocodone as needed 4. Mood: Klonopin 1 mg every morning 2 mg nightly             -antipsychotic agents: N/A     5. Neuropsych: This patient is capable of making decisions on her own behalf. 6. Skin/Wound Care: Routine skin checks 7. Fluids/Electrolytes/Nutrition: Routine in and outs, CMET shows hypoalbuminemia ad supplement I and Os not yet recorded  8.  Symptomatic right carotid stenosis.  Status post right carotid enterectomy 04/21/2019. 9.  Hypertension.  Hydralazine 25 mg every 6 hours, Tenormin 50 mg daily, lisinopril 10 mg daily.  Monitor with increased mobility 10.  Unintentional weight loss.  Work-up per GI services.  Protonix 40 mg twice daily.  Dietary follow-up. Monitor appetite improving 11.  Hyperlipidemia.  Lipitor 12.  Anemia, ? Blood loss admit Hgb was 12.6 will check stool OB on ASA , Plavix and enoxaparin Pt thinks she has seen "a little blood in stool"  LOS: 2 days A FACE TO FACE EVALUATION WAS PERFORMED  Charlett Blake 04/27/2019, 8:24 AM

## 2019-04-27 NOTE — Progress Notes (Signed)
Initial Nutrition Assessment  RD working remotely.  DOCUMENTATION CODES:   Underweight, suspect some degree of malnutrition but unable to confirm at this time  INTERVENTION:   - Liberalize diet to Regular to promote PO intake, verbal with readback order placed per MD  - Ensure Enlive po BID, each supplement provides 350 kcal and 20 grams of protein  - MVI with minerals daily  NUTRITION DIAGNOSIS:   Increased nutrient needs related to other (therapies) as evidenced by estimated needs.  GOAL:   Patient will meet greater than or equal to 90% of their needs  MONITOR:   PO intake, Supplement acceptance, Labs, Weight trends, Skin  REASON FOR ASSESSMENT:   Other (low BMI)    ASSESSMENT:   73 year old female with PMH of HTN, depression, migraine headaches. Pt presented 04/18/19 with left-sided weakness x 3 days with difficulty in speech and facial droop. MRI of the brain showed a 1.4 cm acute infarction within the right corona radiata in addition to multiple chronic lacunar infarcts within the left frontal lobe periventricular white matter as well as bilateral basal ganglia and thalami. CTA of head and neck with extensive plaque right ICA with stenosis as well as carotid dopplers with right ICA stenosis. Pt underwent emergent right carotid enterectomy on 04/21/19. Gastroenterology consulted 04/22/19 for history of unintentional weight loss with CT of the abdomen showing no definite malignancy but did show pneumomediastinum and fluid in the upper mediastinum and lower right neck consistent with postsurgical changes. In addition, it showed distal esophageal thickening and raising the question of esophagitis or even possibly a mass. Pt underwent esophagram/barium swallow showing no evidence of esophageal leak. No esophageal mass lesion evident. Pt admitted to CIR on 12/21.  Pt currently on a Heart Healthy diet. Discussed diet liberalization with MD who agreed. Regular diet ordered.  Spoke  with pt via phone call to room. Pt had difficulty understanding some of the things that RD was saying on the phone call.  Pt reports that her appetite is okay. Pt states, "I didn't get my breakfast early, and it was cold." Pt reports that because of this, she only ate the banana, grape juice, and a few bites of eggs.  Unable to obtain information regarding PO intake PTA due to communication barriers of the phone call.  Pt reports that she has lost weight and that it started "a few weeks ago." Pt unsure of her UBW.  Pt is willing to drink an oral nutrition supplement during admission. RD to order. Will also order daily MVI.  Reviewed weight history in chart. Weight history is limited prior to this month. Last available weight is from March 2019 and shows that pt has lost 14.2 kg (24% weight loss, not significant for timeframe) since that time. RD to monitor weight trends during admission.  Suspect some degree of malnutrition but unable to confirm without NFPE.  Meal Completion: 25-100% x 3 recorded meals  Medications reviewed and include: colace, protonix  Labs reviewed: hemoglobin 9.9  NUTRITION - FOCUSED PHYSICAL EXAM:  Unable to complete at this time. RD working remotely.  Diet Order:   Diet Order            Diet regular Room service appropriate? Yes; Fluid consistency: Thin  Diet effective now              EDUCATION NEEDS:   Education needs have been addressed  Skin:  Skin Assessment: Skin Integrity Issues: Skin Integrity Issues: Incisions: right neck  Last BM:  04/26/19 large type 7  Height:   Ht Readings from Last 1 Encounters:  04/19/19 5' 1.5" (1.562 m)    Weight:   Wt Readings from Last 1 Encounters:  04/26/19 44.8 kg    Ideal Body Weight:  48.9 kg  BMI:  Body mass index is 18.35 kg/m.  Estimated Nutritional Needs:   Kcal:  1300-1500  Protein:  60-75 grams  Fluid:  1.3-1.5 L    Gaynell Face, MS, RD, LDN Inpatient Clinical  Dietitian Pager: [phone] Weekend/After Hours: [phone]

## 2019-04-28 ENCOUNTER — Inpatient Hospital Stay (HOSPITAL_COMMUNITY): Payer: Medicare HMO | Admitting: Speech Pathology

## 2019-04-28 ENCOUNTER — Inpatient Hospital Stay (HOSPITAL_COMMUNITY): Payer: Medicare HMO | Admitting: Physical Therapy

## 2019-04-28 ENCOUNTER — Inpatient Hospital Stay (HOSPITAL_COMMUNITY): Payer: Medicare HMO

## 2019-04-28 NOTE — Progress Notes (Signed)
Speech Language Pathology Daily Session Note  Patient Details  Name: Patricia Stanley MRN: [id number] Date of Birth: November 08, 1945  Today's Date: 04/28/2019 SLP Individual Time: 1015-1045 SLP Individual Time Calculation (min): 30 min  Short Term Goals: Week 1: SLP Short Term Goal 1 (Week 1): Pt will participate in continued cognitive lingustic assessment. SLP Short Term Goal 1 - Progress (Week 1): Met SLP Short Term Goal 2 (Week 1): Pt will demonstrate semi-complex problem solving skills in functional task of medication management with supervision A verbal cues. SLP Short Term Goal 3 (Week 1): Pt will recall current medicines with 80% accuracy and Min A cues. SLP Short Term Goal 4 (Week 1): Pt will recall function of each medicine with 80% accuracy and Min A cues.  Skilled Therapeutic Interventions:  Skilled treatment session targeted medication management. Pt's HOH appeared to impact auditory comprehension. Therefore direct questions (simple/structured) were provided to increase recall of pt's medicines. Given direct questions, pt demonstrate poor health literacy as she stated that she stopped taking most medicines because she "didn't feel they (medicines) were helping her."  She and her husband felt that "because she was thin and didn't do much during the day that she didn't need blood pressure medicine." After extensive questions by this writer it appears that pt had reduced blood pressure medicine and had stopped taking her Prozac and reflux medicine. During conversation it appears that when she stopped taking her Prozac she began staying at home (homebound), when she stopped taking her reflux medicine she started loosing weight (recent esophagram shows increased esophageal thickening). Education provided on need to take medicine as prescribed.   Per imaging she has moderate chronic small vessel ischemic disease that perhaps contributed to her oo poor health literacy and decision by her and  husband to cease taking some of her medicines.   Given new stroke recommend targeting recall of medicines, their function and pill organizer so at least she would be safe at discharge.    Pain Pain Assessment Pain Scale: 0-10 Pain Score: 4  Pain Type: Acute pain Pain Location: Neck Pain Orientation: Right Pain Descriptors / Indicators: Aching;Discomfort Pain Frequency: Intermittent Pain Onset: On-going Patients Stated Pain Goal: 4 Pain Intervention(s): Medication (See eMAR)(norco given)  Therapy/Group: Individual Therapy  Patricia Stanley 04/28/2019, 3:22 PM

## 2019-04-28 NOTE — Progress Notes (Signed)
[facility] PHYSICAL MEDICINE & REHABILITATION PROGRESS NOTE   Subjective/Complaints:  Poor sleep due to freq voids no burning with urination   ROS- neg CP, SOB, N/V/D  Objective:   No results found. Recent Labs    04/26/19 0526  WBC 4.3  HGB 9.9*  HCT 31.1*  PLT 123*   Recent Labs    04/26/19 0526  NA 142  K 4.1  CL 105  CO2 27  GLUCOSE 96  BUN 15  CREATININE 0.91  CALCIUM 9.0    Intake/Output Summary (Last 24 hours) at 04/28/2019 0851 Last data filed at 04/27/2019 1919 Gross per 24 hour  Intake 540 ml  Output --  Net 540 ml     Physical Exam: Vital Signs Blood pressure 132/61, pulse 74, temperature 98.2 F (36.8 C), temperature source Oral, resp. rate 16, weight 44.8 kg, SpO2 98 %.   General: No acute distress Mood and affect are appropriate Heart: Regular rate and rhythm no rubs murmurs or extra sounds Lungs: Clear to auscultation, breathing unlabored, no rales or wheezes Abdomen: Positive bowel sounds, soft nontender to palpation, nondistended Extremities: No clubbing, cyanosis, or edema Skin: No evidence of breakdown, no evidence of rash Neurologic: Cranial nerves II through XII intact, motor strength is 5/5 in bilateral deltoid, bicep, tricep, grip, hip flexor, knee extensors, ankle dorsiflexor and plantar flexor Sensory exam normal sensation to light touch and proprioception in bilateral upper and lower extremities Cerebellar exam dysmetrial Left finger to nose to finger as well as heel to shin Musculoskeletal: Full range of motion in all 4 extremities. No joint swelling   Assessment/Plan: 1. Functional deficits secondary to RIght corona radiata infarct  which require 3+ hours per day of interdisciplinary therapy in a comprehensive inpatient rehab setting.  Physiatrist is providing close team supervision and 24 hour management of active medical problems listed below.  Physiatrist and rehab team continue to assess barriers to discharge/monitor  patient progress toward functional and medical goals  Care Tool:  Bathing    Body parts bathed by patient: Right arm, Left arm, Chest, Abdomen, Front perineal area, Right upper leg, Left upper leg, Right lower leg, Face   Body parts bathed by helper: Buttocks, Left lower leg     Bathing assist Assist Level: Contact Guard/Touching assist     Upper Body Dressing/Undressing Upper body dressing   What is the patient wearing?: Pull over shirt    Upper body assist Assist Level: Maximal Assistance - Patient 25 - 49%    Lower Body Dressing/Undressing Lower body dressing      What is the patient wearing?: Incontinence brief, Pants     Lower body assist Assist for lower body dressing: Maximal Assistance - Patient 25 - 49%     Toileting Toileting    Toileting assist Assist for toileting: Minimal Assistance - Patient > 75%     Transfers Chair/bed transfer  Transfers assist     Chair/bed transfer assist level: Minimal Assistance - Patient > 75%     Locomotion Ambulation   Ambulation assist      Assist level: Minimal Assistance - Patient > 75% Assistive device: Walker-rolling Max distance: 30   Walk 10 feet activity   Assist     Assist level: Minimal Assistance - Patient > 75% Assistive device: Walker-rolling   Walk 50 feet activity   Assist Walk 50 feet with 2 turns activity did not occur: Safety/medical concerns         Walk 150 feet activity  Assist Walk 150 feet activity did not occur: Safety/medical concerns         Walk 10 feet on uneven surface  activity   Assist Walk 10 feet on uneven surfaces activity did not occur: Safety/medical concerns         Wheelchair     Assist Will patient use wheelchair at discharge?: (TBD) Type of Wheelchair: Manual    Wheelchair assist level: Moderate Assistance - Patient 50 - 74% Max wheelchair distance: 10'    Wheelchair 50 feet with 2 turns activity    Assist    Wheelchair 50  feet with 2 turns activity did not occur: Safety/medical concerns       Wheelchair 150 feet activity     Assist  Wheelchair 150 feet activity did not occur: Safety/medical concerns       Blood pressure 132/61, pulse 74, temperature 98.2 F (36.8 C), temperature source Oral, resp. rate 16, weight 44.8 kg, SpO2 98 %.    Medical Problem List and Plan: 1.  Left-sided weakness secondary to right corona radiata infarction Team conference today please see physician documentation under team conference tab, met with team face-to-face to discuss problems,progress, and goals. Formulized individual treatment plan based on medical history, underlying problem and comorbidities.              -patient may shower- PT ,OT  .              -ELOS 12/31 Patricia Stanley in PT, OT, I in SLP 2.  Antithrombotics: -DVT/anticoagulation: Lovenox             -antiplatelet therapy: Aspirin 325 mg daily and Plavix 75 mg daily x3 months then aspirin alone 3. Pain Management: Hydrocodone as needed 4. Mood: Klonopin 1 mg every morning 2 mg nightly             -antipsychotic agents: N/A     5. Neuropsych: This patient is capable of making decisions on her own behalf. 6. Skin/Wound Care: Routine skin checks 7. Fluids/Electrolytes/Nutrition: Routine in and outs, CMET shows hypoalbuminemia ad supplement I and Os not yet recorded  8.  Symptomatic right carotid stenosis.  Status post right carotid enterectomy 04/21/2019. 9.  Hypertension.  Hydralazine 25 mg every 6 hours, Tenormin 50 mg daily, lisinopril 10 mg daily.  Monitor with increased mobility 10.  Unintentional weight loss.  Work-up per GI services.  Protonix 40 mg twice daily.  Dietary follow-up. Monitor appetite improving 11.  Hyperlipidemia.  Lipitor 12.  Anemia, ? Blood loss admit Hgb was 12.6 will check stool OB on ASA , Plavix and enoxaparin Pt thinks she has seen "a little blood in stool"  LOS: 3 days A FACE TO FACE EVALUATION WAS PERFORMED  Patricia Stanley 04/28/2019, 8:51 AM

## 2019-04-28 NOTE — Progress Notes (Signed)
Physical Therapy Session Note  Patient Details  Name: Patricia Stanley MRN: [id number] Date of Birth: 12-10-1945  Today's Date: 04/28/2019 PT Individual Time: 0835-0930 PT Individual Time Calculation (min): 55 min   Short Term Goals: Week 1:  PT Short Term Goal 1 (Week 1): Pt will initiate stair training PT Short Term Goal 2 (Week 1): Pt will ambulate 25' w/ min assist PT Short Term Goal 3 (Week 1): Pt will perform stand pivot w/ min assist PT Short Term Goal 4 (Week 1): Pt will maintain dynamic standing balance w/ min assist  Skilled Therapeutic Interventions/Progress Updates: Pt presented at toilet with NT present agreeable to therapy. PTA took over toileting with PTA donning brief total A and maxA for threading pants. Due to pt shirt also soiled provided modA for donning new top. Performed stand pivot transfer minA with use of wall rail to transfer to toilet. Pt then performed hand hygiene at sink with supervision and increased time. Pt transported to rehab gym and performed stand pivot transfer with RW to mat with minA. Performed toe taps to target x 10 to 2in step and x 10 for 4in step with emphasis on performing knee flexion when placing foot on step.  Pt also attempted for ward lunges to target with emphasis on stepping forward and performing knee flexion x 5 with pt requiring max multimodal cues and ultimately manual facilitation to flex knee. Pt ambulated x 20 ft with RW kicking yoga block with LLE. Pt noted to have improved coordination of L foot and was able to flex knee to kick block when provided single step commands. Pt transported to day room and performed stand pivot to [employer]. Participated in 3 bouts of 10 cycles in seated at 70cm/sec. Performed STS x 3 with cues to equal wt bearing and then performed 10 cycles marching in standing. Pt returned to w/c and transported back to room. Pt remained in w/c at end of session with belt alarm on, call bell within reach and needs met.     Therapy Documentation Precautions:  Precautions Precautions: Fall Precaution Comments: L hemi Restrictions Weight Bearing Restrictions: No   Therapy/Group: Individual Therapy  [place] Delauder  Kaelen Brennan, PTA  04/28/2019, 12:34 PM

## 2019-04-28 NOTE — Progress Notes (Signed)
Occupational Therapy Session Note  Patient Details  Name: Patricia Stanley MRN: [id number] Date of Birth: April 04, 1946  Today's Date: 04/28/2019 OT Individual Time: 1130-1200 OT Individual Time Calculation (min): 30 min    Short Term Goals: Week 1:  OT Short Term Goal 1 (Week 1): Pt will perform toilet transfer with min A OT Short Term Goal 2 (Week 1): Pt will perform toileting with mod A OT Short Term Goal 3 (Week 1): Pt will utlize left hand at diminished level during ADLs with min cues OT Short Term Goal 4 (Week 1): Pt will don shirt with min A  Skilled Therapeutic Interventions/Progress Updates:    Pt received sitting up in the w/c with c/o pain in L arm with use. Pt requesting to not leave room stating she is very tired. Pt agreeable to working on LUE NMR sitting in w/c. Pt completed closed chain BUE shoulder flexion with grasp on towel. Pt able to complete 8x with cueing for technique. Pt then completed elbow flexion/ext with min tactile cueing required to maintain bicep/tricep isolate and not involve shoulder compensation. Pt reported need to urinate. She completed ambulatory transfer into the bathroom with mod A, LLE hyperextension occurring. Pt transferred onto toilet and required min A overall for toileting tasks. Pt returned to w/c in similar fashion. Pt completed [facility] task with the LUE, flipping cards to address pronation supination and picking up small beads to address pincer grasp. Pt requested to return to bed and was left supine with all needs met. Family now present in room.   Therapy Documentation Precautions:  Precautions Precautions: Fall Precaution Comments: L hemi Restrictions Weight Bearing Restrictions: No   Therapy/Group: Individual Therapy  Curtis Sites 04/28/2019, 6:51 AM

## 2019-04-28 NOTE — Progress Notes (Signed)
Physical Therapy Session Note  Patient Details  Name: Patricia Stanley MRN: [id number] Date of Birth: 05/06/1945  Today's Date: 04/28/2019 PT Individual Time: 1420-1520 PT Individual Time Calculation (min): 60 min   Short Term Goals: Week 1:  PT Short Term Goal 1 (Week 1): Pt will initiate stair training PT Short Term Goal 2 (Week 1): Pt will ambulate 25' w/ min assist PT Short Term Goal 3 (Week 1): Pt will perform stand pivot w/ min assist PT Short Term Goal 4 (Week 1): Pt will maintain dynamic standing balance w/ min assist  Skilled Therapeutic Interventions/Progress Updates:   Pt received supine in bed and agreeable to PT. Supine>sit transfer with CGA and min cues for improved use of the LUE to push into sitting.  Stand pivot transfer to WC with CGA on the R. Pt transported to rehab gym in WC.   PT instructed p tin dynamic balance training while engaged in fine motor control task of completing peg board puzzle from flat surface and then placing pieces in box with cross body reach from airex pad. Supervision assist to maintain balance with 1 UE support on RW and mod assist for improved fine motor control and sequencing for grasp motion to manage pegs.   Gait training with RW x 50ft with min assist and min cues for step width. Improved knee flexion and step width noted compared to previous session. Mild increase in hip instability with fatigue. Dynamic gait training with parallel bars forward/backward and side stepping 2 x 10ft each with min assist and moderate cues for improved pelvic control and alignment as well as hip abductor activation.   Step up on 4 inch step with BUE support on RW x 5 BLE with min assist for posture and to prevent knee hyperextension on the LLE.   Pt returned to room and performed stand pivot transfer to bed with CGA. Sit>supine completed with supervision assist, and left supine in bed with call bell in reach and all needs met.           Therapy  Documentation Precautions:  Precautions Precautions: Fall Precaution Comments: L hemi Restrictions Weight Bearing Restrictions: No General: PT Amount of Missed Time (min): 10 Minutes PT Missed Treatment Reason: Patient fatigue    Pain: Pain Assessment Pain Scale: 0-10 Pain Score: 4  Pain Type: Acute pain Pain Location: Neck Pain Orientation: Right Pain Descriptors / Indicators: Aching;Discomfort Pain Frequency: Intermittent Pain Onset: On-going Patients Stated Pain Goal: 4 Pain Intervention(s): Medication (See eMAR)(norco given)    Therapy/Group: Individual Therapy   E  04/28/2019, 4:01 PM  

## 2019-04-29 NOTE — Progress Notes (Signed)
[place] [facility] PHYSICAL MEDICINE & REHABILITATION PROGRESS NOTE   Subjective/Complaints:  freq void but no dysuria, had urinary incont at home   ROS- neg CP, SOB, N/V/D  Objective:   No results found. No results for input(s): WBC, HGB, HCT, PLT in the last 72 hours. No results for input(s): NA, K, CL, CO2, GLUCOSE, BUN, CREATININE, CALCIUM in the last 72 hours.  Intake/Output Summary (Last 24 hours) at 04/29/2019 0909 Last data filed at 04/28/2019 1811 Gross per 24 hour  Intake 354 ml  Output --  Net 354 ml     Physical Exam: Vital Signs Blood pressure (!) 148/56, pulse 65, temperature 98 F (36.7 C), resp. rate 18, weight 44.8 kg, SpO2 98 %.   General: No acute distress Mood and affect are appropriate HEENT- surgical incision R ant cervical without drainage, moderate ecchymosis  Heart: Regular rate and rhythm no rubs murmurs or extra sounds Lungs: Clear to auscultation, breathing unlabored, no rales or wheezes Abdomen: Positive bowel sounds, soft nontender to palpation, nondistended Extremities: No clubbing, cyanosis, or edema Skin: No evidence of breakdown, no evidence of rash Neurologic: Cranial nerves II through XII intact, motor strength is 5/5 in bilateral deltoid, bicep, tricep, grip, hip flexor, knee extensors, ankle dorsiflexor and plantar flexor Sensory exam normal sensation to light touch and proprioception in bilateral upper and lower extremities Cerebellar exam dysmetrial Left finger to nose to finger as well as heel to shin Musculoskeletal: Full range of motion in all 4 extremities. No joint swelling   Assessment/Plan: 1. Functional deficits secondary to RIght corona radiata infarct  which require 3+ hours per day of interdisciplinary therapy in a comprehensive inpatient rehab setting.  Physiatrist is providing close team supervision and 24 hour management of active medical problems listed below.  Physiatrist and rehab team continue to assess barriers to  discharge/monitor patient progress toward functional and medical goals  Care Tool:  Bathing    Body parts bathed by patient: Right arm, Left arm, Chest, Abdomen, Front perineal area, Right upper leg, Left upper leg, Right lower leg, Face   Body parts bathed by helper: Buttocks, Left lower leg     Bathing assist Assist Level: Contact Guard/Touching assist     Upper Body Dressing/Undressing Upper body dressing   What is the patient wearing?: Pull over shirt    Upper body assist Assist Level: Maximal Assistance - Patient 25 - 49%    Lower Body Dressing/Undressing Lower body dressing      What is the patient wearing?: Incontinence brief, Pants     Lower body assist Assist for lower body dressing: Maximal Assistance - Patient 25 - 49%     Toileting Toileting    Toileting assist Assist for toileting: Minimal Assistance - Patient > 75%     Transfers Chair/bed transfer  Transfers assist     Chair/bed transfer assist level: Minimal Assistance - Patient > 75%     Locomotion Ambulation   Ambulation assist      Assist level: Minimal Assistance - Patient > 75% Assistive device: Walker-rolling Max distance: 50   Walk 10 feet activity   Assist     Assist level: Minimal Assistance - Patient > 75% Assistive device: Walker-rolling   Walk 50 feet activity   Assist Walk 50 feet with 2 turns activity did not occur: Safety/medical concerns  Assist level: Minimal Assistance - Patient > 75% Assistive device: Walker-rolling    Walk 150 feet activity   Assist Walk 150 feet activity did not occur:  Safety/medical concerns         Walk 10 feet on uneven surface  activity   Assist Walk 10 feet on uneven surfaces activity did not occur: Safety/medical concerns         Wheelchair     Assist Will patient use wheelchair at discharge?: (TBD) Type of Wheelchair: Manual    Wheelchair assist level: Moderate Assistance - Patient 50 - 74% Max wheelchair  distance: 10'    Wheelchair 50 feet with 2 turns activity    Assist    Wheelchair 50 feet with 2 turns activity did not occur: Safety/medical concerns       Wheelchair 150 feet activity     Assist  Wheelchair 150 feet activity did not occur: Safety/medical concerns       Blood pressure (!) 148/56, pulse 65, temperature 98 F (36.7 C), resp. rate 18, weight 44.8 kg, SpO2 98 %.    Medical Problem List and Plan: 1.  Left-sided weakness secondary to right corona radiata infarction CIR PT, OT               -patient may shower- PT ,OT  .              -ELOS 1/5 2.  Antithrombotics: -DVT/anticoagulation: Lovenox             -antiplatelet therapy: Aspirin 325 mg daily and Plavix 75 mg daily x3 months then aspirin alone 3. Pain Management: Hydrocodone as needed 4. Mood: Klonopin 1 mg every morning 2 mg nightly             -antipsychotic agents: N/A     5. Neuropsych: This patient is capable of making decisions on her own behalf. 6. Skin/Wound Care: Routine skin checks 7. Fluids/Electrolytes/Nutrition: Routine in and outs, CMET shows hypoalbuminemia 8.  Symptomatic right carotid stenosis.  Status post right carotid enterectomy 04/21/2019. 9.  Hypertension.  Hydralazine 25 mg every 6 hours, Tenormin 50 mg daily, lisinopril 10 mg daily.  Monitor with increased mobility 10.  Unintentional weight loss.  Work-up per GI services.  Protonix 40 mg twice daily.  Dietary follow-up. Monitor appetite improving 11.  Hyperlipidemia.  Lipitor 12.  Anemia, ? Blood loss due to R CEA, admit Hgb was 12.6 will check stool OB on ASA , Plavix and enoxaparin Pt thinks she has seen "a little blood in stool" no stool sample obtained yet , f/u CBC in am LOS: 4 days A FACE TO FACE EVALUATION WAS PERFORMED  Erick Colace 04/29/2019, 9:09 AM

## 2019-04-29 NOTE — Progress Notes (Signed)
Pt continues to have urgency/frehuency voiding. Each void consist of small amounts of amber color urine with foul odor. Some attempts at voiding are unsuccessful with no outout. Pt states " it feels like there is still urine in there". Bladder scan showed 80 ml in the bladder.

## 2019-04-30 ENCOUNTER — Inpatient Hospital Stay (HOSPITAL_COMMUNITY): Payer: Medicare HMO | Admitting: Occupational Therapy

## 2019-04-30 ENCOUNTER — Inpatient Hospital Stay (HOSPITAL_COMMUNITY): Payer: Medicare HMO

## 2019-04-30 LAB — URINALYSIS, ROUTINE W REFLEX MICROSCOPIC
Bacteria, UA: NONE SEEN
Bilirubin Urine: NEGATIVE
Glucose, UA: NEGATIVE mg/dL
Hgb urine dipstick: NEGATIVE
Ketones, ur: NEGATIVE mg/dL
Nitrite: NEGATIVE
Protein, ur: NEGATIVE mg/dL
Specific Gravity, Urine: 1.018 (ref 1.005–1.030)
pH: 6 (ref 5.0–8.0)

## 2019-04-30 LAB — CBC
HCT: 32 % — ABNORMAL LOW (ref 36.0–46.0)
Hemoglobin: 10.1 g/dL — ABNORMAL LOW (ref 12.0–15.0)
MCH: 31 pg (ref 26.0–34.0)
MCHC: 31.6 g/dL (ref 30.0–36.0)
MCV: 98.2 fL (ref 80.0–100.0)
Platelets: 141 K/uL — ABNORMAL LOW (ref 150–400)
RBC: 3.26 MIL/uL — ABNORMAL LOW (ref 3.87–5.11)
RDW: 13.2 % (ref 11.5–15.5)
WBC: 4.4 K/uL (ref 4.0–10.5)
nRBC: 0 % (ref 0.0–0.2)

## 2019-04-30 MED ORDER — SIMETHICONE 80 MG PO CHEW: 80.0000 mg | CHEWABLE_TABLET | Freq: Four times a day (QID) | ORAL | Status: DC | PRN

## 2019-04-30 NOTE — Progress Notes (Addendum)
Physical Therapy Session Note  Patient Details  Name: Patricia Stanley MRN: [id number] Date of Birth: 07-24-1945  Today's Date: 04/30/2019 PT Individual Time: 1300-1400 PT Individual Time Calculation (min): 60 min   Short Term Goals: Week 1:  PT Short Term Goal 1 (Week 1): Pt will initiate stair training PT Short Term Goal 2 (Week 1): Pt will ambulate 25' w/ min assist PT Short Term Goal 3 (Week 1): Pt will perform stand pivot w/ min assist PT Short Term Goal 4 (Week 1): Pt will maintain dynamic standing balance w/ min assist  Skilled Therapeutic Interventions/Progress Updates:  Pt asleep in bed; easily awakened but said that she was tired.  She had not eaten lunch.   She then stated that she thought she had had a bowel accident, which PT confirmed.  Rolling L><R with cues and bed rails for hygiene change; she bridged up on request and pulled pants back up with set up.   Pt then stated that she needed to urinate.  Supine> sit to L with min assist, flat bed no rails.  Stand pivot with min assist to R, min cues to [facility] next to bed, with foot stool under feet for bil LE support. Pt continent of urine and further BM after extended time.  She performed peri care in sitting for urine; PT peri care at rectum, with pt standing. Stand pivot to L with max assist to return to sittting on bed.  Pt stated that she felt weak.  PT set up lunch for pt ; she fed herself using R hand, with cues for smaller bites.    PT added firm seat under cushion of wc, and back cushion, for improved pelvic support and to fit pt's petite frame.  Pt stated that it felt better.  Up/down (4) 6" high steps, bil rails, mod assist to ascend and descend, with mod cues for sequencing , for step -to method.  At end of session, pt resting in wc with lunch set up.  Seat belt alarm set and needs at hand.     Therapy Documentation Precautions:  Precautions Precautions: Fall Precaution Comments: L hemi Restrictions Weight Bearing  Restrictions: No  Pain: pt denied Pain Assessment Pain Score: 0-No pain      Therapy/Group: Individual Therapy  Oree Hislop 04/30/2019, 4:30 PM

## 2019-04-30 NOTE — Progress Notes (Signed)
Occupational Therapy Session Note  Patient Details  Name: Patricia Stanley MRN: [id number] Date of Birth: [birth year]  Today's Date: 04/30/2019 OT Individual Time: 0174-9449 and 6759-1638 OT Individual Time Calculation (min): 56 min and 57 min  Skilled Therapeutic Interventions/Progress Updates:    Pt greeted in bed, eating breakfast using her Rt hand. To work on [employer], OT assisted pt with using Lt hand at dominant level during task. Mod facilitation to pierce food with fork due to apraxia. She initiated active assist using Rt however appeared to rely more on Rt vs balance hand use bilaterally. Instructed pt to consume beverages using B hands. Note that pt frequently readjusted position of fork in her Lt hand using Rt. Provided her with built up foam to increase ease of using Lt during eating tasks. Pt then reported need to toilet. Her walker was not present in the room, so stand pivot<w/c completed with Mod A. Min A stand pivot>toilet using grab bar with both hands. Pt able to complete clothing mgt while interchanging hand use with Min balance assist. She had continent B+B void. Steady assist for hygiene completion in squat-stand position. Pt once again using both Lt and Rt hands during hygiene tasks. OT checked for thoroughness afterwards with wash cloth coming out clean. Mod A for pulling up brief, however pt able to pull up pants fully. Once she returned to the w/c, pt proceeded with bathing/dressing tasks sit<stand at the sink. Mod vcs to increase functional use of Lt and Min facilitation by therapist to do so due to apraxia. Min A for sit<stands and standing balance during dynamic tasks. Pt able to utilize figure 4 functionally with vcs. Vcs also to sit vs stand for safety during dressing. She needed reminders to use hemi strategies as well. After handwashing, pt opted to remain up in the w/c. Left her with all needs within reach and safety belt fastened.   2nd Session 1:1 tx (57  min) Pt greeted in the w/c and premedicated for pain. Wanting to start session by using the bathroom. Stand pivot<toilet completed with Min A. Min A for balance during clothing mgt while using both hands. Pt voided B+B and completed hygiene in squat stand position, once again interchanging hand use with vcs. She doffed pants and then donned pull up from home. Pt needing A to thread L LE using figure 4 position. Close supervision for dynamic sitting balance at this time. Pt able to elevate pull ups and pants over hips in standing, and then she returned to the w/c. Steady assist for sit<stand at the sink where she washed her hands. Next pt was escorted to the dayroom. She engaged in linen folding while standing at the elevated table. Steady assist-close supervision for standing balance while bilaterally engaged in activity. Tx focus was placed on bimanual coordination, standing endurance, and Lt NMR. She required verbal and tactile cues to using Lt at dominant level due to overcompensation with Rt. Pt often losing grip with Lt when folding pillowcases. After 5 minutes or so of standing activity pt needed to sit and rest. Pt was then escorted back to the room via w/c. We retrieved another walker for her en route. She remained in the w/c with all needs, safety belt fastened, and spouse present. Educated spouse regarding seated Lt NMR activities that he could assist her with in the room.   Therapy Documentation Precautions:  Precautions Precautions: Fall Precaution Comments: L hemi Restrictions Weight Bearing Restrictions: No Pain: 5/10 in Lt leg  during session 1. RN notified but pt is not yet due for her medicine. Pt reported pain was manageable and declined use of hot/cold modalities to address at end of session  Pain Assessment Pain Score: 0-No pain ADL: ADL Grooming: Maximal assistance Where Assessed-Grooming: Sitting at sink Upper Body Bathing: Minimal assistance Where Assessed-Upper Body Bathing:  Shower Lower Body Bathing: Maximal assistance Where Assessed-Lower Body Bathing: Shower Upper Body Dressing: Minimal assistance Where Assessed-Upper Body Dressing: Wheelchair Lower Body Dressing: Maximal assistance Where Assessed-Lower Body Dressing: Wheelchair Toileting: Moderate assistance Where Assessed-Toileting: Teacher, adult education: Moderate assistance Toilet Transfer Method: Squat pivot Toilet Transfer Equipment: Acupuncturist: Moderate assistance Film/video editor Method: Ambulating, Stand pivot [employer] Equipment: Transfer tub bench      Therapy/Group: Individual Therapy  Grabiela Wohlford A Shellyann Wandrey 04/30/2019, 12:40 PM

## 2019-04-30 NOTE — Progress Notes (Addendum)
[facility] PHYSICAL MEDICINE & REHABILITATION PROGRESS NOTE   Subjective/Complaints:  Complains of dysuria and urinary frequency. Also with gas.  Otherwise smiling and says she is doing better.   ROS- neg CP, SOB, N/V/D  Objective:   No results found. Recent Labs    04/30/19 0711  WBC 4.4  HGB 10.1*  HCT 32.0*  PLT 141*   No results for input(s): NA, K, CL, CO2, GLUCOSE, BUN, CREATININE, CALCIUM in the last 72 hours.  Intake/Output Summary (Last 24 hours) at 04/30/2019 1207 Last data filed at 04/30/2019 1116 Gross per 24 hour  Intake 360 ml  Output 750 ml  Net -390 ml     Physical Exam: Vital Signs Blood pressure (!) 156/69, pulse 78, temperature 98.3 F (36.8 C), resp. rate 18, weight 44.8 kg, SpO2 98 %.   General: No acute distress Mood and affect are appropriate HEENT- surgical incision R ant cervical without drainage, moderate ecchymosis  Heart: Regular rate and rhythm no rubs murmurs or extra sounds Lungs: Clear to auscultation, breathing unlabored, no rales or wheezes Abdomen: Positive bowel sounds, soft nontender to palpation, nondistended Extremities: No clubbing, cyanosis, or edema Skin: No evidence of breakdown, no evidence of rash Neurologic: Cranial nerves II through XII intact, motor strength is 5/5 in bilateral deltoid, bicep, tricep, grip, hip flexor, knee extensors, ankle dorsiflexor and plantar flexor Sensory exam normal sensation to light touch and proprioception in bilateral upper and lower extremities Cerebellar exam dysmetrial Left finger to nose to finger as well as heel to shin Musculoskeletal: Full range of motion in all 4 extremities. No joint swelling   Assessment/Plan: 1. Functional deficits secondary to RIght corona radiata infarct  which require 3+ hours per day of interdisciplinary therapy in a comprehensive inpatient rehab setting.  Physiatrist is providing close team supervision and 24 hour management of active medical problems  listed below.  Physiatrist and rehab team continue to assess barriers to discharge/monitor patient progress toward functional and medical goals  Care Tool:  Bathing    Body parts bathed by patient: Right arm, Left arm, Chest, Abdomen, Front perineal area, Right upper leg, Left upper leg, Right lower leg, Face, Buttocks, Left lower leg   Body parts bathed by helper: Buttocks, Left lower leg     Bathing assist Assist Level: Minimal Assistance - Patient > 75%     Upper Body Dressing/Undressing Upper body dressing   What is the patient wearing?: Pull over shirt    Upper body assist Assist Level: Supervision/Verbal cueing    Lower Body Dressing/Undressing Lower body dressing      What is the patient wearing?: Incontinence brief, Pants     Lower body assist Assist for lower body dressing: Moderate Assistance - Patient 50 - 74%     Toileting Toileting    Toileting assist Assist for toileting: Minimal Assistance - Patient > 75%     Transfers Chair/bed transfer  Transfers assist     Chair/bed transfer assist level: Minimal Assistance - Patient > 75%     Locomotion Ambulation   Ambulation assist      Assist level: Minimal Assistance - Patient > 75% Assistive device: Walker-rolling Max distance: 50   Walk 10 feet activity   Assist     Assist level: Minimal Assistance - Patient > 75% Assistive device: Walker-rolling   Walk 50 feet activity   Assist Walk 50 feet with 2 turns activity did not occur: Safety/medical concerns  Assist level: Minimal Assistance - Patient > 75% Assistive  device: Walker-rolling    Walk 150 feet activity   Assist Walk 150 feet activity did not occur: Safety/medical concerns         Walk 10 feet on uneven surface  activity   Assist Walk 10 feet on uneven surfaces activity did not occur: Safety/medical concerns         Wheelchair     Assist Will patient use wheelchair at discharge?: (TBD) Type of  Wheelchair: Manual    Wheelchair assist level: Moderate Assistance - Patient 50 - 74% Max wheelchair distance: 10'    Wheelchair 50 feet with 2 turns activity    Assist    Wheelchair 50 feet with 2 turns activity did not occur: Safety/medical concerns       Wheelchair 150 feet activity     Assist  Wheelchair 150 feet activity did not occur: Safety/medical concerns       Blood pressure (!) 156/69, pulse 78, temperature 98.3 F (36.8 C), resp. rate 18, weight 44.8 kg, SpO2 98 %.    Medical Problem List and Plan: 1.  Left-sided weakness secondary to right corona radiata infarction CIR PT, OT               -patient may shower- PT ,OT              -ELOS 1/5 2.  Antithrombotics: -DVT/anticoagulation: Lovenox             -antiplatelet therapy: Aspirin 325 mg daily and Plavix 75 mg daily x3 months then aspirin alone 3. Pain Management: Hydrocodone as needed 4. Mood: Klonopin 1 mg every morning 2 mg nightly             -antipsychotic agents: N/A 5. Neuropsych: This patient is capable of making decisions on her own behalf. 6. Skin/Wound Care: Routine skin checks 7. Fluids/Electrolytes/Nutrition: Routine in and outs, CMET shows hypoalbuminemia 8.  Symptomatic right carotid stenosis.  Status post right carotid enterectomy 04/21/2019. 9.  Hypertension.  Hydralazine 25 mg every 6 hours, Tenormin 50 mg daily, lisinopril 10 mg daily.  Monitor with increased mobility 10.  Unintentional weight loss.  Work-up per GI services.  Protonix 40 mg twice daily.  Dietary follow-up. Monitor appetite improving 11.  Hyperlipidemia.  Lipitor 12.  Anemia, ? Blood loss due to R CEA, admit Hgb was 12.6 will check stool OB on ASA , Plavix and enoxaparin Pt thinks she has seen "a little blood in stool" no stool sample obtained yet , f/u CBC in am  12/26: Hgb improved on CBC 13: Dysuria with increased urinary frequency: ordered UA/UC.  14. Gas: added simethicone prn LOS: 5 days A FACE TO FACE  EVALUATION WAS PERFORMED  Clint Bolder P Braidon Chermak 04/30/2019, 12:07 PM

## 2019-04-30 NOTE — Progress Notes (Addendum)
Pt c/o of burning while urinating and voiding small amounts each time. Pt is concerned and would like to know if she has a UTI. Provider will be notified of pts concerns.   After voiding, pt states that she still has the urgency to void but just can not get it out. Bladder scan read 110 ml

## 2019-04-30 NOTE — Progress Notes (Signed)
Speech Language Pathology Daily Session Note  Patient Details  Name: Patricia Stanley MRN: [id number] Date of Birth: August 09, 1945  Today's Date: 04/30/2019 SLP Individual Time: 1005-1100 SLP Individual Time Calculation (min): 55 min  Short Term Goals: Week 1: SLP Short Term Goal 1 (Week 1): Pt will participate in continued cognitive lingustic assessment. SLP Short Term Goal 1 - Progress (Week 1): Met SLP Short Term Goal 2 (Week 1): Pt will demonstrate semi-complex problem solving skills in functional task of medication management with supervision A verbal cues. SLP Short Term Goal 3 (Week 1): Pt will recall current medicines with 80% accuracy and Min A cues. SLP Short Term Goal 4 (Week 1): Pt will recall function of each medicine with 80% accuracy and Min A cues.  Skilled Therapeutic Interventions: Skilled ST services focused on cognitive skills. SLP facilitated education and recall with aid of current medication name/function/time per day,  pt required mod A verbal cues fade to min A verbal cues to recall with medication list. SLP also facilitated basic medication management (only completing x1 per day medication) in QID pill box organizer, pt required max A fade to mod A verbal cues for problem solving. Pt demonstrated most difficulty with organization of pill box organizer (unfamilar and not use at home) pertaining to times per day verse days of the week, suspect pt will improve once task becomes more familiar. Pt was left in room with call bell within reach and chair alarm set. ST recommends to continue skilled ST services.      Pain Pain Assessment Pain Score: 0-No pain  Therapy/Group: Individual Therapy  Seddrick Flax  [facility] 04/30/2019, 12:40 PM

## 2019-04-30 NOTE — Progress Notes (Addendum)
Pt appears to be very tearful, and continues to talk about  " when the lord comes back, he is coming soon, and I am going to meet him". Pt is referencing toher "end of life" experience, and how it is coming soon. Pt denies suicide intentions or a plan, states "I am just preparing for the day when it comes". Pt denies pain at this time. Pt is resting in bed.

## 2019-05-01 ENCOUNTER — Inpatient Hospital Stay (HOSPITAL_COMMUNITY): Payer: Medicare HMO | Admitting: Occupational Therapy

## 2019-05-01 ENCOUNTER — Inpatient Hospital Stay (HOSPITAL_COMMUNITY): Payer: Medicare HMO | Admitting: Speech Pathology

## 2019-05-01 LAB — URINE CULTURE: Culture: <10000 — AB

## 2019-05-01 LAB — OCCULT BLOOD X 1 CARD TO LAB, STOOL: Fecal Occult Bld: NEGATIVE

## 2019-05-01 MED ORDER — TRAZODONE HCL 50 MG PO TABS
50.0000 mg | ORAL_TABLET | Freq: Once | ORAL | Status: AC
  Administered 2019-05-01: 50 mg via ORAL
  Filled 2019-05-01: qty 1

## 2019-05-01 MED ORDER — TRAZODONE HCL 50 MG PO TABS
50.0000 mg | ORAL_TABLET | Freq: Every day | ORAL | Status: DC
  Administered 2019-05-01 – 2019-05-09 (×9): 50 mg via ORAL
  Filled 2019-05-01 (×9): qty 1

## 2019-05-01 NOTE — Progress Notes (Signed)
Scheduled Klonipin given at 2127. At 2306-1 PRN vicodin given for c/o right sided HA and right neck sore from recent surgery. At [place], patient reports "I'm not feeling well", vitals checked. Patient tearful and anxious. "I can't sleep", "I feel like I'm shaking". Reports feeling anxious. Paged Dr.Raulker R/T above info. Trazodone 50mg 's ordered and given. Slept good after med given. Patrici Ranks A

## 2019-05-01 NOTE — Progress Notes (Signed)
[facility] PHYSICAL MEDICINE & REHABILITATION PROGRESS NOTE   Subjective/Complaints: Complains of needing to urinate now, waiting for aide.  Slept poorly last night, finally slept with Trazodone.   ROS- neg CP, SOB, N/V/D  Objective:   No results found. Recent Labs    04/30/19 0711  WBC 4.4  HGB 10.1*  HCT 32.0*  PLT 141*   No results for input(s): NA, K, CL, CO2, GLUCOSE, BUN, CREATININE, CALCIUM in the last 72 hours.  Intake/Output Summary (Last 24 hours) at 05/01/2019 1428 Last data filed at 05/01/2019 0341 Gross per 24 hour  Intake 360 ml  Output 600 ml  Net -240 ml     Physical Exam: Vital Signs Blood pressure (!) 120/57, pulse 72, temperature 97.8 F (36.6 C), resp. rate 18, weight 44.8 kg, SpO2 99 %.   General: No acute distress Mood and affect are appropriate HEENT- surgical incision R ant cervical without drainage, moderate ecchymosis  Heart: Regular rate and rhythm no rubs murmurs or extra sounds Lungs: Clear to auscultation, breathing unlabored, no rales or wheezes Abdomen: Positive bowel sounds, soft nontender to palpation, nondistended Extremities: No clubbing, cyanosis, or edema Skin: No evidence of breakdown, no evidence of rash Neurologic: Cranial nerves II through XII intact, motor strength is 5/5 in bilateral deltoid, bicep, tricep, grip, hip flexor, knee extensors, ankle dorsiflexor and plantar flexor Sensory exam normal sensation to light touch and proprioception in bilateral upper and lower extremities Cerebellar exam dysmetrial Left finger to nose to finger as well as heel to shin Musculoskeletal: Full range of motion in all 4 extremities. No joint swelling   Assessment/Plan: 1. Functional deficits secondary to RIght corona radiata infarct  which require 3+ hours per day of interdisciplinary therapy in a comprehensive inpatient rehab setting.  Physiatrist is providing close team supervision and 24 hour management of active medical problems  listed below.  Physiatrist and rehab team continue to assess barriers to discharge/monitor patient progress toward functional and medical goals  Care Tool:  Bathing    Body parts bathed by patient: Right arm, Left arm, Chest, Abdomen, Front perineal area, Right upper leg, Left upper leg, Right lower leg, Face, Buttocks, Left lower leg   Body parts bathed by helper: Buttocks, Left lower leg     Bathing assist Assist Level: Minimal Assistance - Patient > 75%     Upper Body Dressing/Undressing Upper body dressing   What is the patient wearing?: Pull over shirt    Upper body assist Assist Level: Supervision/Verbal cueing    Lower Body Dressing/Undressing Lower body dressing      What is the patient wearing?: Underwear/pull up, Pants     Lower body assist Assist for lower body dressing: Moderate Assistance - Patient 50 - 74%     Toileting Toileting    Toileting assist Assist for toileting: Moderate Assistance - Patient 50 - 74%     Transfers Chair/bed transfer  Transfers assist     Chair/bed transfer assist level: Maximal Assistance - Patient 25 - 49%     Locomotion Ambulation   Ambulation assist      Assist level: Minimal Assistance - Patient > 75% Assistive device: Walker-rolling Max distance: 50   Walk 10 feet activity   Assist     Assist level: Minimal Assistance - Patient > 75% Assistive device: Walker-rolling   Walk 50 feet activity   Assist Walk 50 feet with 2 turns activity did not occur: Safety/medical concerns  Assist level: Minimal Assistance - Patient > 75%  Assistive device: Walker-rolling    Walk 150 feet activity   Assist Walk 150 feet activity did not occur: Safety/medical concerns         Walk 10 feet on uneven surface  activity   Assist Walk 10 feet on uneven surfaces activity did not occur: Safety/medical concerns         Wheelchair     Assist Will patient use wheelchair at discharge?: (TBD) Type of  Wheelchair: Manual    Wheelchair assist level: Moderate Assistance - Patient 50 - 74% Max wheelchair distance: 10'    Wheelchair 50 feet with 2 turns activity    Assist    Wheelchair 50 feet with 2 turns activity did not occur: Safety/medical concerns       Wheelchair 150 feet activity     Assist  Wheelchair 150 feet activity did not occur: Safety/medical concerns       Blood pressure (!) 120/57, pulse 72, temperature 97.8 F (36.6 C), resp. rate 18, weight 44.8 kg, SpO2 99 %.    Medical Problem List and Plan: 1.  Left-sided weakness secondary to right corona radiata infarction CIR PT, OT              -patient may shower- PT ,OT              -ELOS 1/5 2.  Antithrombotics: -DVT/anticoagulation: Lovenox             -antiplatelet therapy: Aspirin 325 mg daily and Plavix 75 mg daily x3 months then aspirin alone 3. Pain Management: Hydrocodone as needed 4. Mood: Klonopin 1 mg every morning 2 mg nightly             -antipsychotic agents: N/A 5. Neuropsych: This patient is capable of making decisions on her own behalf. 6. Skin/Wound Care: Routine skin checks 7. Fluids/Electrolytes/Nutrition: Routine in and outs, CMET shows hypoalbuminemia 8.  Symptomatic right carotid stenosis.  Status post right carotid enterectomy 04/21/2019. 9.  Hypertension.  Hydralazine 25 mg every 6 hours, Tenormin 50 mg daily, lisinopril 10 mg daily.  Monitor with increased mobility 10.  Unintentional weight loss.  Work-up per GI services.  Protonix 40 mg twice daily.  Dietary follow-up. Monitor appetite improving 11.  Hyperlipidemia.  Lipitor 12.  Anemia, ? Blood loss due to R CEA, admit Hgb was 12.6 will check stool OB on ASA , Plavix and enoxaparin Pt thinks she has seen "a little blood in stool" no stool sample obtained yet , f/u CBC in am  12/26: Hgb improved on CBC 13: Dysuria with increased urinary frequency: ordered UA/UC.   12/27: UA negative, dysuria resolved.  14. Gas: added  simethicone prn 15. Insomnia: will schedule Trazodone.  LOS: 6 days A FACE TO FACE EVALUATION WAS PERFORMED  Drema Pry Dakiya Puopolo 05/01/2019, 2:28 PM

## 2019-05-01 NOTE — Progress Notes (Signed)
Occupational Therapy Session Note  Patient Details  Name: Patricia Stanley MRN: [id number] Date of Birth: 28-Jun-1945  Today's Date: 05/01/2019 OT Individual Time: 1324-4010 OT Individual Time Calculation (min): 57 min   Short Term Goals: Week 1:  OT Short Term Goal 1 (Week 1): Pt will perform toilet transfer with min A OT Short Term Goal 2 (Week 1): Pt will perform toileting with mod A OT Short Term Goal 3 (Week 1): Pt will utlize left hand at diminished level during ADLs with min cues OT Short Term Goal 4 (Week 1): Pt will don shirt with min A  Skilled Therapeutic Interventions/Progress Updates:    Pt greeted EOB with spouse present. She needed to use the restroom. Ambulatory transfer to toilet completed with Min A, note increased Lt knee instability/buckling after she crossed threshold of bathroom. OT completed clothing mgt due to urgency. She had small BM in brief was able to resume B+B void while on the toilet. Pt needed A to thread pull up and pants over Lt leg. Min A for standing balance while elevating LB garments. Stand pivot>w/c completed using RW with Min A. Continued working on standing balance during handwashing, denture care, and handwashing for a 2nd time. Pt able to active assist Lt when brushing her teeth and reach for soap dispenser with Lt when given cuing. She didn't want to shower or complete any other self care tasks, therefore pt was escorted via w/c to dayroom. Worked on Lt hand coordination via bean bag toss, pt reaching outside of base of support and crossing midline with Lt UE to take bean bags from therapist. Noted improved accuracy with increased repetition (tossing bean bags into hoop). Also worked on bimanual coordination via hand clapping to meaningful Christmas songs, pt appearing to enjoy task, smiling, visibly trying hard to improve accuracy with Lt hand. At end of session pt was returned to room via w/c. Left her with all needs within reach and safety belt fastened.    Therapy Documentation Precautions:  Precautions Precautions: Fall Precaution Comments: L hemi Restrictions Weight Bearing Restrictions: No Vital Signs:  Pain: in neck, pt reported pain was manageable during session without interventions to address Pain Assessment Pain Scale: 0-10 Pain Score: 0-No pain ADL: ADL Grooming: Maximal assistance Where Assessed-Grooming: Sitting at sink Upper Body Bathing: Minimal assistance Where Assessed-Upper Body Bathing: Shower Lower Body Bathing: Maximal assistance Where Assessed-Lower Body Bathing: Shower Upper Body Dressing: Minimal assistance Where Assessed-Upper Body Dressing: Wheelchair Lower Body Dressing: Maximal assistance Where Assessed-Lower Body Dressing: Wheelchair Toileting: Moderate assistance Where Assessed-Toileting: Glass blower/designer: Moderate assistance Toilet Transfer Method: Squat pivot Toilet Transfer Equipment: Energy manager: Moderate assistance Social research officer, government Method: Ambulating, Stand pivot [employer] Equipment: Transfer tub bench      Therapy/Group: Individual Therapy  Raejean Swinford A Meloni Hinz 05/01/2019, 3:54 PM

## 2019-05-01 NOTE — Progress Notes (Signed)
Occupational Therapy Session Note  Patient Details  Name: Patricia Stanley MRN: [id number] Date of Birth: [birth year]  Today's Date: 05/01/2019 OT Individual Time: 1130-1202 OT Individual Time Calculation (min): 32 min    Short Term Goals: Week 1:  OT Short Term Goal 1 (Week 1): Pt will perform toilet transfer with min A OT Short Term Goal 2 (Week 1): Pt will perform toileting with mod A OT Short Term Goal 3 (Week 1): Pt will utlize left hand at diminished level during ADLs with min cues OT Short Term Goal 4 (Week 1): Pt will don shirt with min A  Skilled Therapeutic Interventions/Progress Updates:    Patient seated edge of bed, alert, requests to use the bathroom.  Sit to stand and ambulation in room with RW to/from bed, toilet and w/c with min A - facilitation for left side motor control and cues for walker placement.   toileting with mod A for hygiene and clothing management.  Mod A to change soiled pull up and doff/donn pants.  She is able to stand at sink for washing hands with min A.   Completed seated motor control and reaching activities with bilateral UEs.  Completed coordination activities with focus on left and bimanual tasks.  Ambulation with RW for approx 30 feet min a.  She returned to bed at close of session with min A.  Bed alarm set and call bell in reach.    Therapy Documentation Precautions:  Precautions Precautions: Fall Precaution Comments: L hemi Restrictions Weight Bearing Restrictions: No General:   Vital Signs:  Pain: Pain Assessment Pain Scale: 0-10 Pain Score: 0-No pain Other Treatments:     Therapy/Group: Individual Therapy  Carlos Levering 05/01/2019, 2:18 PM

## 2019-05-01 NOTE — Progress Notes (Signed)
Speech Language Pathology Daily Session Note  Patient Details  Name: Patricia Stanley MRN: [id number] Date of Birth: [birth year]  Today's Date: 05/01/2019 SLP Individual Time: 0934-1000 SLP Individual Time Calculation (min): 26 min  Short Term Goals: Week 1: SLP Short Term Goal 1 (Week 1): Pt will participate in continued cognitive lingustic assessment. SLP Short Term Goal 1 - Progress (Week 1): Met SLP Short Term Goal 2 (Week 1): Pt will demonstrate semi-complex problem solving skills in functional task of medication management with supervision A verbal cues. SLP Short Term Goal 3 (Week 1): Pt will recall current medicines with 80% accuracy and Min A cues. SLP Short Term Goal 4 (Week 1): Pt will recall function of each medicine with 80% accuracy and Min A cues.  Skilled Therapeutic Interventions:  Pt was seen for skilled ST targeting cognitive goals.  SLP facilitated the session with ongoing medication management tasks to address problem solving.  Pt organized the remaining 4 pills from her medication list into a 4x daily pill box with min-mod assist verbal and visual cues for task organization and error awareness.  Overall, pt seems uncomfortable with the idea of managing her medications at home which I suspect to be related to the large amount of new information that she is currently trying to process and store.  Pt asked if it was okay if her husband or daughter managed her medications for her and therapist was in agreement given the amount of  medications that she is taking and the assistance pt required to complete task during today's session.  Therapist also discussed the benefits of using a 4x daily pill box at home to minimize risk for error when taking medications.  Pt verbalized understanding.  Pt was left in wheelchair with call bell within reach.  Continue per current plan of care.    Pain Pain Assessment Pain Scale: 0-10 Pain Score: 0-No pain  Therapy/Group: Individual  Therapy  [name], Selinda Orion 05/01/2019, 12:41 PM

## 2019-05-02 ENCOUNTER — Inpatient Hospital Stay (HOSPITAL_COMMUNITY): Payer: Medicare HMO | Admitting: Physical Therapy

## 2019-05-02 ENCOUNTER — Inpatient Hospital Stay (HOSPITAL_COMMUNITY): Payer: Medicare HMO

## 2019-05-02 ENCOUNTER — Inpatient Hospital Stay (HOSPITAL_COMMUNITY): Payer: Medicare HMO | Admitting: Occupational Therapy

## 2019-05-02 DIAGNOSIS — D62 Acute posthemorrhagic anemia: Secondary | ICD-10-CM

## 2019-05-02 DIAGNOSIS — I10 Essential (primary) hypertension: Secondary | ICD-10-CM

## 2019-05-02 NOTE — Progress Notes (Signed)
Occupational Therapy Session Note  Patient Details  Name: Patricia Stanley MRN: [id number] Date of Birth: 25-Aug-1945  Today's Date: 05/02/2019 OT Individual Time: 1300-1414 OT Individual Time Calculation (min): 74 min   Short Term Goals: Week 1:  OT Short Term Goal 1 (Week 1): Pt will perform toilet transfer with min A OT Short Term Goal 2 (Week 1): Pt will perform toileting with mod A OT Short Term Goal 3 (Week 1): Pt will utlize left hand at diminished level during ADLs with min cues OT Short Term Goal 4 (Week 1): Pt will don shirt with min A  Skilled Therapeutic Interventions/Progress Updates:    Pt greeted in recliner with c/o soreness behind her Lt knee. RN in during session to provide pain medicine. Started with ambulatory transfer to toilet using RW with Min A, vcs for improved Lt foot clearance. Pt had B+B void and completed toileting tasks with steady assist for balance, Min A for brief. Cues for using Lt hand to manage faucet levers and reach for soap dispenser while handwashing at sink afterwards (in standing). She opted not to shower today, wanting instead to do her tx off unit. Escorted pt via w/c to the gift shop. Worked on bimanual coordination while self propelling w/c in shop, Min A to navigate around tight spaces and over small thresholds. Functional reaching with Lt to touch purses, clothing, socks, and stuffed animals. Pt particularly liked holding the stuffed animals, activating the sound making ones via crank or buttons, once again using Lt hand to do so for NMR. She was then escorted back to the room and completed short distance ambulatory transfer to bed with Min A using RW, mildly antalgic gait due to pain. Supervision for sit<supine and pt was then repositioned for comfort. Pt left in bed with all needs within reach and bed alarm set, spouse present. Tx focus placed on ADL retraining, dynamic balance, NMR, and psychosocial health.     Therapy Documentation Precautions:   Precautions Precautions: Fall Precaution Comments: L hemi Restrictions Weight Bearing Restrictions: No Vital Signs: Therapy Vitals Temp: 98.4 F (36.9 C) Temp Source: Oral Pulse Rate: 91 Resp: 17 BP: 139/61 Patient Position (if appropriate): Sitting Oxygen Therapy SpO2: 99 % O2 Device: Room Air Pain: Pain Assessment Pain Scale: 0-10 Pain Score: 7  Pain Type: Acute pain Pain Location: Leg Pain Orientation: Left Pain Descriptors / Indicators: Aching Pain Frequency: Intermittent Pain Onset: Progressive ADL: ADL Grooming: Maximal assistance Where Assessed-Grooming: Sitting at sink Upper Body Bathing: Minimal assistance Where Assessed-Upper Body Bathing: Shower Lower Body Bathing: Maximal assistance Where Assessed-Lower Body Bathing: Shower Upper Body Dressing: Minimal assistance Where Assessed-Upper Body Dressing: Wheelchair Lower Body Dressing: Maximal assistance Where Assessed-Lower Body Dressing: Wheelchair Toileting: Moderate assistance Where Assessed-Toileting: Glass blower/designer: Moderate assistance Toilet Transfer Method: Squat pivot Toilet Transfer Equipment: Energy manager: Moderate assistance Social research officer, government Method: Ambulating, Stand pivot [employer] Equipment: Transfer tub bench      Therapy/Group: Individual Therapy  Adell Panek A Lyndall Windt 05/02/2019, 4:01 PM

## 2019-05-02 NOTE — Plan of Care (Signed)
  Problem: RH BOWEL ELIMINATION Goal: RH STG MANAGE BOWEL WITH ASSISTANCE Description: STG Manage Bowel with Mod Assistance. Outcome: Progressing Goal: RH STG MANAGE BOWEL W/MEDICATION W/ASSISTANCE Description: STG Manage Bowel with Medication with  Mod Assistance. Outcome: Progressing   Problem: RH BLADDER ELIMINATION Goal: RH STG MANAGE BLADDER WITH ASSISTANCE Description: STG Manage Bladder With  Mod Assistance Outcome: Progressing   Problem: RH SKIN INTEGRITY Goal: RH STG SKIN FREE OF INFECTION/BREAKDOWN Outcome: Progressing Goal: RH STG ABLE TO PERFORM INCISION/WOUND CARE W/ASSISTANCE Description: STG Able To Perform Incision/Wound Care With  [employer]. Outcome: Progressing   Problem: RH SAFETY Goal: RH STG ADHERE TO SAFETY PRECAUTIONS W/ASSISTANCE/DEVICE Description: STG Adhere to Safety Precautions With  Mod Assistance/Device. Outcome: Progressing   Problem: RH PAIN MANAGEMENT Goal: RH STG PAIN MANAGED AT OR BELOW PT'S PAIN GOAL Description: Pain <=3/10 Outcome: Progressing

## 2019-05-02 NOTE — Progress Notes (Signed)
Physical Therapy Session Note  Patient Details  Name: Patricia Stanley MRN: [id number] Date of Birth: [birth year]  Today's Date: 05/02/2019 PT Individual Time: 1005-1120 PT Individual Time Calculation (min): 75 min   Short Term Goals: Week 1:  PT Short Term Goal 1 (Week 1): Pt will initiate stair training PT Short Term Goal 2 (Week 1): Pt will ambulate 25' w/ min assist PT Short Term Goal 3 (Week 1): Pt will perform stand pivot w/ min assist PT Short Term Goal 4 (Week 1): Pt will maintain dynamic standing balance w/ min assist  Skilled Therapeutic Interventions/Progress Updates: Pt presented in bed agreeable to therapy. Pt states slept well and denies pain. Performed supine to sit with supervision and use of bed features. PTA donned shoes total A for time management. Performed stand pivot with RW to w/c CGA and pt transported to rehab gym for energy conservation. Pt participated in obstacle courses x 2 initially weaving around cones with RW and CGA then stepping over thresholds. Pt transferred to supine on mat and performed bridges x 10, hooklying hip ER with level 2 resistance band 2 x 10. Pt transferred to sitting supervision and participated in standing "ice skaters" at table and standing hip abd x 10 ea. Pt required tactile cues for decreasing QL activation as compensatory strategy. Pt then transferred to hallway and performed side stepping and backwards walking at wall rail with 1lb cuff. Pt was able to side step with min cues for facing wall vs turning towards step. When performing backwards walking pt required mod/max cues for sequencing with max cues for increasing BOS and to not cross unaffected leg behind affected leg. Pt transported back to room at end of session and transferred to recliner via ambulatory transfer and CGA. Pt indicating need for bathroom, pressed call button to notify nsg of pt's disposition.      Therapy Documentation Precautions:  Precautions Precautions:  Fall Precaution Comments: L hemi Restrictions Weight Bearing Restrictions: No General:   Vital Signs: Therapy Vitals Temp: 98.4 F (36.9 C) Temp Source: Oral Pulse Rate: 91 Resp: 17 BP: 139/61 Patient Position (if appropriate): Sitting Oxygen Therapy SpO2: 99 % O2 Device: Room Air Pain: Pain Assessment Pain Scale: 0-10 Pain Score: 7  Pain Type: Acute pain Pain Location: Leg Pain Orientation: Left Pain Descriptors / Indicators: Aching Pain Frequency: Intermittent Pain Onset: Progressive   Therapy/Group: Individual Therapy  Markayla Reichart  Gurley Climer, PTA  05/02/2019, 3:07 PM

## 2019-05-02 NOTE — Progress Notes (Signed)
+/-   sleep. Tearful at times, but better than previous night. Patient wears brief at night, reports wearing PTA. Asking for bedpan at [facility], encouraged to get up to [facility]. Without C/O HA. Patrici Ranks A

## 2019-05-02 NOTE — Progress Notes (Signed)
[facility] PHYSICAL MEDICINE & REHABILITATION PROGRESS NOTE   Subjective/Complaints: Up at sink. No new complaints. Says she slept better. Denies pain  ROS: Patient denies fever, rash, sore throat, blurred vision, nausea, vomiting, diarrhea, cough, shortness of breath or chest pain, joint or back pain, headache, or mood change.    Objective:   No results found. Recent Labs    04/30/19 0711  WBC 4.4  HGB 10.1*  HCT 32.0*  PLT 141*   No results for input(s): NA, K, CL, CO2, GLUCOSE, BUN, CREATININE, CALCIUM in the last 72 hours.  Intake/Output Summary (Last 24 hours) at 05/02/2019 1107 Last data filed at 05/02/2019 0719 Gross per 24 hour  Intake 720 ml  Output --  Net 720 ml     Physical Exam: Vital Signs Blood pressure (!) 97/32, pulse 70, temperature 98.4 F (36.9 C), resp. rate 18, weight 44.8 kg, SpO2 98 %.   Constitutional: No distress . Vital signs reviewed. HEENT: EOMI, oral membranes moist Neck: supple, right CEA site with bruising and mild swelling Cardiovascular: RRR without murmur. No JVD    Respiratory: CTA Bilaterally without wheezes or rales. Normal effort    GI: BS +, non-tender, non-distended  Extremities: No clubbing, cyanosis, or edema Skin: No evidence of breakdown, no evidence of rash Neurologic: Cranial nerves II through XII intact, motor strength is 5/5 in bilateral deltoid, bicep, tricep, grip, hip flexor, knee extensors, ankle dorsiflexor and plantar flexor Sensory exam normal sensation to light touch and proprioception in bilateral upper and lower extremities Dysmetria on left  Musculoskeletal: Full range of motion in all 4 extremities. No joint swelling Psych: pleasant and up beat  Assessment/Plan: 1. Functional deficits secondary to RIght corona radiata infarct  which require 3+ hours per day of interdisciplinary therapy in a comprehensive inpatient rehab setting.  Physiatrist is providing close team supervision and 24 hour management of  active medical problems listed below.  Physiatrist and rehab team continue to assess barriers to discharge/monitor patient progress toward functional and medical goals  Care Tool:  Bathing    Body parts bathed by patient: Right arm, Left arm, Chest, Abdomen, Front perineal area, Right upper leg, Left upper leg, Right lower leg, Face, Buttocks, Left lower leg   Body parts bathed by helper: Buttocks, Left lower leg     Bathing assist Assist Level: Minimal Assistance - Patient > 75%     Upper Body Dressing/Undressing Upper body dressing   What is the patient wearing?: Pull over shirt    Upper body assist Assist Level: Supervision/Verbal cueing    Lower Body Dressing/Undressing Lower body dressing      What is the patient wearing?: Underwear/pull up, Pants     Lower body assist Assist for lower body dressing: Moderate Assistance - Patient 50 - 74%     Toileting Toileting    Toileting assist Assist for toileting: Moderate Assistance - Patient 50 - 74%     Transfers Chair/bed transfer  Transfers assist     Chair/bed transfer assist level: Maximal Assistance - Patient 25 - 49%     Locomotion Ambulation   Ambulation assist      Assist level: Minimal Assistance - Patient > 75% Assistive device: Walker-rolling Max distance: 50   Walk 10 feet activity   Assist     Assist level: Minimal Assistance - Patient > 75% Assistive device: Walker-rolling   Walk 50 feet activity   Assist Walk 50 feet with 2 turns activity did not occur: Safety/medical concerns  Assist level: Minimal Assistance - Patient > 75% Assistive device: Walker-rolling    Walk 150 feet activity   Assist Walk 150 feet activity did not occur: Safety/medical concerns         Walk 10 feet on uneven surface  activity   Assist Walk 10 feet on uneven surfaces activity did not occur: Safety/medical concerns         Wheelchair     Assist Will patient use wheelchair at  discharge?: (TBD) Type of Wheelchair: Manual    Wheelchair assist level: Moderate Assistance - Patient 50 - 74% Max wheelchair distance: 10'    Wheelchair 50 feet with 2 turns activity    Assist    Wheelchair 50 feet with 2 turns activity did not occur: Safety/medical concerns       Wheelchair 150 feet activity     Assist  Wheelchair 150 feet activity did not occur: Safety/medical concerns       Blood pressure (!) 97/32, pulse 70, temperature 98.4 F (36.9 C), resp. rate 18, weight 44.8 kg, SpO2 98 %.    Medical Problem List and Plan: 1.  Left-sided weakness secondary to right corona radiata infarction   --Continue CIR therapies including PT, OT, and SLP               -patient may shower- PT ,OT              -ELOS 1/5 2.  Antithrombotics: -DVT/anticoagulation: Lovenox             -antiplatelet therapy: Aspirin 325 mg daily and Plavix 75 mg daily x3 months then aspirin alone 3. Pain Management: Hydrocodone as needed 4. Mood: Klonopin 1 mg every morning 2 mg nightly, mood positive at present             -antipsychotic agents: N/A 5. Neuropsych: This patient is capable of making decisions on her own behalf. 6. Skin/Wound Care: Routine skin checks 7. Fluids/Electrolytes/Nutrition: Routine in and outs, CMET shows hypoalbuminemia 8.  Symptomatic right carotid stenosis.  Status post right carotid enterectomy 04/21/2019. 9.  Hypertension.  Hydralazine 25 mg every 6 hours, Tenormin 50 mg daily, lisinopril 10 mg daily.  Monitor with increased mobility 10.  Unintentional weight loss.  Work-up per GI services.  Protonix 40 mg twice daily.  Dietary follow-up. Monitor appetite improving 11.  Hyperlipidemia.  Lipitor 12.  Anemia, ? Blood loss due to R CEA, admit Hgb was 12.6    - on ASA , Plavix and enoxaparin  Pt thinks she has seen "a little blood in stool" --stool ob negative  12/26: Hgb improved on CBC to 10.1 13: Dysuria with increased urinary frequency: ordered UA/UC.    12/27: UA negative, dysuria resolved.  UCX with Insig growth 14. Gas: added simethicone prn 15. Insomnia: will schedule Trazodone.   12/28 Seemed to better last night   LOS: 7 days A FACE TO FACE EVALUATION WAS PERFORMED  Ranelle Oyster 05/02/2019, 11:07 AM

## 2019-05-02 NOTE — Progress Notes (Signed)
Speech Language Pathology Weekly Progress and Session Note  Patient Details  Name: Patricia Stanley MRN: [id number] Date of Birth: [birth year]  Beginning of progress report period: April 26, 2019 End of progress report period: May 02, 2019  Today's Date: 05/02/2019 SLP Individual Time: 855-930     Short Term Goals: Week 1: SLP Short Term Goal 1 (Week 1): Pt will participate in continued cognitive lingustic assessment. SLP Short Term Goal 1 - Progress (Week 1): Met SLP Short Term Goal 2 (Week 1): Pt will demonstrate semi-complex problem solving skills in functional task of medication management with supervision A verbal cues. SLP Short Term Goal 2 - Progress (Week 1): Not met SLP Short Term Goal 3 (Week 1): Pt will recall current medicines with 80% accuracy and Min A cues. SLP Short Term Goal 3 - Progress (Week 1): Met SLP Short Term Goal 4 (Week 1): Pt will recall function of each medicine with 80% accuracy and Min A cues. SLP Short Term Goal 4 - Progress (Week 1): Met    New Short Term Goals: Week 2: SLP Short Term Goal 1 (Week 2): STG=LTG due to short ELOS  Weekly Progress Updates: Pt met 3 out 4 goals. Pt demonstrated recall of medication name and function with min A verbal cues for use of visual aid and min-supervision A verbal cues for semi-complex problem solving of QID pill organizer medication management system. Pt would continue to benefit from skilled ST services and family education to ensure health literacy/complaince of continuing medication at home. Pt will require supervision A, with no further ST services need upon discharge to home. .     Intensity: Minumum of 1-2 x/day, 30 to 90 minutes Frequency: 3 to 5 out of 7 days Duration/Length of Stay: 1/15 Treatment/Interventions: Cueing hierarchy;Cognitive remediation/compensation;Functional tasks;Medication managment   Daily Session  Skilled Therapeutic Interventions:  Skilled ST services focused on cognitive  skills. SLP facilitated recall of medication name and function given visual aid, pt required min A verbal cues. SLP also facilitated medication management completing only BID portion pt required supervision A verbal cues following distinction of dinner/evening/supper on pill organizer. Pt was left in room with call bell within reach and bedalarm set. ST recommends to continue skilled ST services.     General    Pain Pain Assessment Pain Scale: 0-10 Pain Score: 6   Therapy/Group: Individual Therapy  Elonna Mcfarlane  [facility] 05/03/2019, 7:21 AM

## 2019-05-03 ENCOUNTER — Inpatient Hospital Stay (HOSPITAL_COMMUNITY): Payer: Medicare HMO | Admitting: Occupational Therapy

## 2019-05-03 ENCOUNTER — Encounter (HOSPITAL_COMMUNITY): Payer: Medicare HMO | Admitting: Psychology

## 2019-05-03 ENCOUNTER — Inpatient Hospital Stay (HOSPITAL_COMMUNITY): Payer: Medicare HMO | Admitting: Physical Therapy

## 2019-05-03 ENCOUNTER — Inpatient Hospital Stay (HOSPITAL_COMMUNITY): Payer: Medicare HMO

## 2019-05-03 DIAGNOSIS — F331 Major depressive disorder, recurrent, moderate: Secondary | ICD-10-CM

## 2019-05-03 MED ORDER — METHOCARBAMOL 500 MG PO TABS
500.0000 mg | ORAL_TABLET | Freq: Every day | ORAL | Status: DC
  Administered 2019-05-03 – 2019-05-10 (×8): 500 mg via ORAL
  Filled 2019-05-03 (×8): qty 1

## 2019-05-03 NOTE — Consult Note (Signed)
Neuropsychological Consultation   Patient:   Patricia SaltsMartha J Stanley   DOB:   08/24/1945  MR Number:  [id number]  [facility]  [facility] [address] [facility] [address] [id number] [facility] [place] [id number] Dept: [phone] Loc: [phone]           Date of Service:   05/03/2019  Start Time:   315 End Time:   4:15 PM  Provider/Observer:  Arley PhenixJohn Maryfrances Portugal, Psy.D.       Clinical Neuropsychologist       Billing Code/Service: [id number]  Chief Complaint:    Patricia HummerMartha Stanley is a 73 year old female with a history of hypertension, depression, migraine headache and quitting smoking 23 years ago.  The patient is described as being ambulatory at home but has not left the house in over a year and was very sedentary and significant weight loss over the past year and significant indications of depression.  The patient and her husband reports that she had been taking Prozac for some time and had done well with it but discontinued taking Prozac about 1 year ago.  The patient presented on 04/18/2019 with left-sided weakness over the past 3 days with difficulty in speech and facial droop.  MRI showed 1.4 cm acute infarction within the right corona radiata in addition to multiple chronic lacunar infarcts within the left frontal lobe periventricular white matter as well as bilateral basal ganglia and thalamic regions.  CTA of head and neck with extensive plaque right ICA with stenosis and right ICA stenosis.  Patient underwent emergent right carotid enterectomy per vascular surgeon.  Reason for Service:  The patient was referred for neuropsychological consultation due to coping and adjustment issues.  Concerns about prior history of depression are noted and the patient had taken Prozac for some time but discontinued about 1 year ago when her depressive symptoms became much worse and she is not left the house in over a year and significant weight loss.  Below is the HPI  for the current admission.  HPI: Patricia Stanley is a 73 year old right-handed female with history of hypertension, depression, migraine headaches and quit smoking 23 years ago.  Per chart review she lives with her spouse.  1 level home 4 steps to entry.  By report patient ambulatory in the home but has not left the house in over a year and was very sedentary as well as reports of significant weight loss over the past year.  Patient presented 04/18/2019 with left-sided weakness x3 days with difficulty in speech and facial droop.  Noted blood pressure systolic in the 180s.  Cranial CT scan negative.  Patient did not receive TPA.  MRI of the brain showed a 1.4 cm acute infarction within the right corona radiata in addition to multiple chronic lacunar infarcts within the left frontal lobe periventricular white matter as well as bilateral basal ganglia and thalami.  CTA of head and neck with extensive plaque right ICA with 65 to 70% stenosis as well as carotid Dopplers with right ICA 40 to 59% stenosis..  Patient underwent emergent right carotid enterectomy per vascular surgery Dr. Chestine Sporelark 04/21/2019.  Neurology services follow-up and maintained on aspirin 325 mg as well as Plavix x3 months then aspirin alone.  Patient is also maintained on Lovenox for DVT prophylaxis.  Echocardiogram with ejection fraction of 65% without emboli.  Gastroenterology consulted 04/22/2019 for history of unintentional weight loss with CT of the abdomen showing no definite malignancy but did show pneumomediastinum  and fluid in the upper mediastinum and lower right neck consistent with postsurgical changes but in addition, did show distal esophageal thickening and raising the question of esophagitis or even possibly a mass.  Patient did have an endoscopy and colonoscopy 3-1/2 years ago by Dr. Wynetta Emery that showed endoscopy normal specifically without evidence of any esophagitis and with normal duodenal biopsies.  The colonoscopy was also  reportedly negative.  Patient underwent esophagram/barium swallow showing no evidence of esophageal leak.  No esophageal mass lesion evident.  Therapy evaluations completed and patient was admitted for a comprehensive rehab program.  Current Status:  The patient was teary-eyed and crying at various points during the session today.  She acknowledged a history of significant depression and reports that she knows that this is played a role in her increased risk of stroke and she had been avoiding of leaving the house or any type of significant activity.  The patient reports that she has been taking benzo diazepam for some time for her anxiety but she discontinued Prozac after feel like it was not helping her anymore.  However, there is a clear deterioration in her function over the past year or more to her she stopped taking Prozac.  The patient denied any side effects from Prozac in the past.  The patient's husband reports that he is clearly aware of her significant depression and also reported that the neurologist had concerns about her depressive features as well.  He denied noticing any side effects when she was taking Prozac but the patient discontinued claiming that it made her feel like she was a "drug addict."  Behavioral Observation: Patricia Stanley  presents as a 73 y.o.-year-old Right Caucasian Female who appeared her stated age. her dress was Appropriate and she was Well Groomed and her manners were Appropriate to the situation.  her participation was indicative of Appropriate and Redirectable behaviors.  There were any physical disabilities noted.  she displayed an appropriate level of cooperation and motivation.     Interactions:    Active Appropriate and Redirectable  Attention:   abnormal and attention span appeared shorter than expected for age  Memory:   within normal limits; recent and remote memory intact  Visuo-spatial:  not examined  Speech (Volume):  low  Speech:   normal;  normal  Thought Process:  Coherent and Relevant  Though Content:  WNL; not suicidal and not homicidal  Orientation:   person, place, time/date and situation  Judgment:   Fair  Planning:   Poor  Affect:    Depressed and Tearful  Mood:    Depressed  Insight:   Fair  Intelligence:   normal  Medical History:   Past Medical History:  Diagnosis Date  . Closed fracture of coccyx ([place]) 2015   AFTER FALL  . Depression   . GERD (gastroesophageal reflux disease)   . Headache    MIGRAINES OCCASIONAL  . History of kidney stones    HAS NOW  . Hypertension    Psychiatric History:  The patient has a history of significant depressive disorder as well as anxiety symptoms.  The patient continues to take benzo diazepam for her anxiety and she had taking SSRI (Prozac) for some time but discontinued approximately 1 year ago.  The patient denies any side effects or difficulties with the SSRI medication.  Family Med/Psych History: History reviewed. No pertinent family history.  Impression/DX:  Patricia Stanley is a 73 year old female with a history of hypertension, depression, migraine headache and quitting  smoking 23 years ago.  The patient is described as being ambulatory at home but has not left the house in over a year and was very sedentary and significant weight loss over the past year and significant indications of depression.  The patient and her husband reports that she had been taking Prozac for some time and had done well with it but discontinued taking Prozac about 1 year ago.  The patient presented on 04/18/2019 with left-sided weakness over the past 3 days with difficulty in speech and facial droop.  MRI showed 1.4 cm acute infarction within the right corona radiata in addition to multiple chronic lacunar infarcts within the left frontal lobe periventricular white matter as well as bilateral basal ganglia and thalamic regions.  CTA of head and neck with extensive plaque right ICA with  stenosis and right ICA stenosis.  Patient underwent emergent right carotid enterectomy per vascular surgeon.  The patient was teary-eyed and crying at various points during the session today.  She acknowledged a history of significant depression and reports that she knows that this is played a role in her increased risk of stroke and she had been avoiding of leaving the house or any type of significant activity.  The patient reports that she has been taking benzo diazepam for some time for her anxiety but she discontinued Prozac after feel like it was not helping her anymore.  However, there is a clear deterioration in her function over the past year or more to her she stopped taking Prozac.  The patient denied any side effects from Prozac in the past.  The patient's husband reports that he is clearly aware of her significant depression and also reported that the neurologist had concerns about her depressive features as well.  He denied noticing any side effects when she was taking Prozac but the patient discontinued claiming that it made her feel like she was a "drug addict."  Disposition/Plan:  I will follow-up with the patient first of next week if possible.  At this point, it is expected that she will be discharged on the fifth and I will try to see her before then.  I will also talk with her attending and PA about the appropriateness of restarting her SSRI medication so we can see a couple of days before she discharged with this medication.  Diagnosis:    Small vessel cerebrovascular accident (CVA) [facility]) - Plan: Ambulatory referral to Neurology         Electronically Signed   _______________________ Arley Phenix, Psy.D.

## 2019-05-03 NOTE — Progress Notes (Signed)
Nutrition Follow-up  RD working remotely.  DOCUMENTATION CODES:   Underweight, suspect some degree of malnutrition but unable to confirm at this time  INTERVENTION:   - Continue Regular diet order  - Ensure Enlive po BID, each supplement provides 350 kcal and 20 grams of protein  - MVI with minerals daily  NUTRITION DIAGNOSIS:   Increased nutrient needs related to other (therapies) as evidenced by estimated needs.  Ongoing, being addressed via supplements  GOAL:   Patient will meet greater than or equal to 90% of their needs  Progressing  MONITOR:   PO intake, Supplement acceptance, Labs, Weight trends, Skin  REASON FOR ASSESSMENT:   Other (low BMI)    ASSESSMENT:   73 year old female with PMH of HTN, depression, migraine headaches. Pt presented 04/18/19 with left-sided weakness x 3 days with difficulty in speech and facial droop. MRI of the brain showed a 1.4 cm acute infarction within the right corona radiata in addition to multiple chronic lacunar infarcts within the left frontal lobe periventricular white matter as well as bilateral basal ganglia and thalami. CTA of head and neck with extensive plaque right ICA with stenosis as well as carotid dopplers with right ICA stenosis. Pt underwent emergent right carotid enterectomy on 04/21/19. Gastroenterology consulted 04/22/19 for history of unintentional weight loss with CT of the abdomen showing no definite malignancy but did show pneumomediastinum and fluid in the upper mediastinum and lower right neck consistent with postsurgical changes. In addition, it showed distal esophageal thickening and raising the question of esophagitis or even possibly a mass. Pt underwent esophagram/barium swallow showing no evidence of esophageal leak. No esophageal mass lesion evident. Pt admitted to CIR on 12/21.  Noted target d/c date of 05/10/19.  Pt accepting most Ensure Enlive per MAR.  No new weights since 12/22. Recommend obtaining  updated weight.  Spoke with pt via phone call to room. Pt in good spirits and stating appreciation of RD.  Pt reports having a good appetite and eating well at meals. Pt reports consuming "some" of the Ensure Enlive oral nutrition supplements. RD encouraged continued PO intake at meals and acceptance of supplements. Pt expresses understanding.  Meal Completion: 50-100% x last 8 meals (averaging 82%)  Medications reviewed and include: colace, Ensure Enlive BID, MVI with minerals daily, protonix  Labs reviewed.  NUTRITION - FOCUSED PHYSICAL EXAM:  Unable to complete at this time. RD working remotely.  Diet Order:   Diet Order            Diet regular Room service appropriate? Yes; Fluid consistency: Thin  Diet effective now              EDUCATION NEEDS:   Education needs have been addressed  Skin:  Skin Assessment: Skin Integrity Issues: Incisions: right neck  Last BM:  05/02/19  Height:   Ht Readings from Last 1 Encounters:  04/19/19 5' 1.5" (1.562 m)    Weight:   Wt Readings from Last 1 Encounters:  04/26/19 44.8 kg    Ideal Body Weight:  48.9 kg  BMI:  Body mass index is 18.35 kg/m.  Estimated Nutritional Needs:   Kcal:  1300-1500  Protein:  60-75 grams  Fluid:  1.3-1.5 L    Gaynell Face, MS, RD, LDN Inpatient Clinical Dietitian Pager: [phone] Weekend/After Hours: [phone]

## 2019-05-03 NOTE — Progress Notes (Signed)
Speech Language Pathology Daily Session Note  Patient Details  Name: DEJANEE THIBEAUX MRN: [id number] Date of Birth: 1945-10-05  Today's Date: 05/03/2019 SLP Individual Time: 1004-1030 SLP Individual Time Calculation (min): 26 min  Short Term Goals: Week 2: SLP Short Term Goal 1 (Week 2): STG=LTG due to short ELOS  Skilled Therapeutic Interventions:Skilled ST services focused on cognitive skills. Pt requested to use bathroom, SLP assisted with RW providing min-supervision A and mod I for problem solving. SLP facilitated complex problem solving skills in familiar QID pill organizer medication management task, pt required min A fade to supervision A verbal cues for error awareness. Pt was left in room with call bell within reach and bed alarm set. ST recommends to continue skilled ST services.      Pain Pain Assessment Pain Score: 0-No pain  Therapy/Group: Individual Therapy  Janitza Revuelta  [facility] 05/03/2019, 4:54 PM

## 2019-05-03 NOTE — Plan of Care (Signed)
  Problem: Consults Goal: Nutrition Consult-if indicated Outcome: Progressing   Problem: RH BOWEL ELIMINATION Goal: RH STG MANAGE BOWEL WITH ASSISTANCE Description: STG Manage Bowel with Mod Assistance. Outcome: Progressing Goal: RH STG MANAGE BOWEL W/MEDICATION W/ASSISTANCE Description: STG Manage Bowel with Medication with  Mod Assistance. Outcome: Progressing   Problem: RH BLADDER ELIMINATION Goal: RH STG MANAGE BLADDER WITH ASSISTANCE Description: STG Manage Bladder With  Mod Assistance Outcome: Progressing   Problem: RH SKIN INTEGRITY Goal: RH STG SKIN FREE OF INFECTION/BREAKDOWN Outcome: Progressing Goal: RH STG ABLE TO PERFORM INCISION/WOUND CARE W/ASSISTANCE Description: STG Able To Perform Incision/Wound Care With  [employer]. Outcome: Progressing   Problem: RH SAFETY Goal: RH STG ADHERE TO SAFETY PRECAUTIONS W/ASSISTANCE/DEVICE Description: STG Adhere to Safety Precautions With  Mod Assistance/Device. Outcome: Progressing   Problem: RH PAIN MANAGEMENT Goal: RH STG PAIN MANAGED AT OR BELOW PT'S PAIN GOAL Description: Pain <=3/10 Outcome: Progressing   Problem: RH KNOWLEDGE DEFICIT Goal: RH STG INCREASE KNOWLEDGE OF HYPERTENSION Outcome: Progressing Goal: RH STG INCREASE KNOWLEGDE OF HYPERLIPIDEMIA Outcome: Progressing Goal: RH STG INCREASE KNOWLEDGE OF STROKE PROPHYLAXIS Outcome: Progressing   Problem: Consults Goal: RH STROKE PATIENT EDUCATION Description: See Patient Education module for education specifics  Outcome: Progressing

## 2019-05-03 NOTE — Progress Notes (Signed)
Occupational Therapy Session Note  Patient Details  Name: Patricia Stanley MRN: [id number] Date of Birth: 1945-06-29  Today's Date: 05/03/2019 OT Individual Time: 2458-0998 and 1415-1500 OT Individual Time Calculation (min): 72 min and 45 mins   Short Term Goals: Week 1:  OT Short Term Goal 1 (Week 1): Pt will perform toilet transfer with min A OT Short Term Goal 2 (Week 1): Pt will perform toileting with mod A OT Short Term Goal 3 (Week 1): Pt will utlize left hand at diminished level during ADLs with min cues OT Short Term Goal 4 (Week 1): Pt will don shirt with min A  Skilled Therapeutic Interventions/Progress Updates:    Session 1: Upon entering the room, pt supine in bed and requesting to shower this session. Pt ambulating with RW into bathroom with min guard and L knee hyperextension noted. Pt transferred onto TTB to doff clothing items with min cuing for safety awareness with technique. Pt remained seated on TTB for bathing task and incorporated LUE into functional tasks without cuing needed to do so. Pt did not drop wash cloth from L UE during task. Pt exiting the shower and seated on commode chair for dressing tasks with focus on hemiplegic dressing. Min cuing for hemiplegic dressing techniques and min A for standing balance with LB clothing balance. Pt seated in wheelchair at sink and B hand coordination task to blow dry and comb hair. Pt brushing teeth at sink with supervision level while seated secondary to fatigue. Pt transferred from wheelchair>recliner chair with min hand held assistance. Chair alarm activated and call bell within reach.   Session 2:  Upon entering the room, pt seated in recliner chair with husband present in the room. OT discussing home set up and possible need of bathroom equipment with spouse. He reports, " She will be able to step over the tub ledge like she did before." Pt standing from chair and transferring into wheelchair with RW and min guard. OT assisted pt  via wheelchair to tub shower room secondary to time management. OT set up tub shower with TTB and demonstrated transfer with pt returning demonstration with min guard overall for safety. OT then demonstrating how unsafe it would be for pt to attempt to step over tub ledge. However, pt was not even able to shift all of weight onto L LE to step over ledge. Spouse determined after this demonstration that he did need TTB for safety at home. OT recommended spouse taking sliding doors off track and put up shower curtain at home. Pt returning to room and verbalizing need for toileting. Pt ambulating with min guard into bathroom with RW. Pt able to perform hygiene and clothing management with assistance for balance. Pt standing at sink with close supervision - min guard to wash B hands and used L UE to turn on/off water. Pt returning to bed at end of session with supervision for sit >supine. OT cut resistive cube into small pieces and having to pick up and squeeze with three jaw chuck and then place into cup held by R hand. Pt with no drops this session. Pt remained supine with call bell and all needed items within reach. Bed alarm activated.  Therapy Documentation Precautions:  Precautions Precautions: Fall Precaution Comments: L hemi Restrictions Weight Bearing Restrictions: No General:   Vital Signs: Therapy Vitals Pulse Rate: 70 BP: 124/64 Patient Position (if appropriate): Sitting Pain: Pain Assessment Pain Scale: 0-10 Pain Score: 0-No pain Faces Pain Scale: No hurt ADL: ADL  Grooming: Maximal assistance Where Assessed-Grooming: Sitting at sink Upper Body Bathing: Minimal assistance Where Assessed-Upper Body Bathing: Shower Lower Body Bathing: Maximal assistance Where Assessed-Lower Body Bathing: Shower Upper Body Dressing: Minimal assistance Where Assessed-Upper Body Dressing: Wheelchair Lower Body Dressing: Maximal assistance Where Assessed-Lower Body Dressing: Wheelchair Toileting:  Moderate assistance Where Assessed-Toileting: Teacher, adult education: Moderate assistance Toilet Transfer Method: Squat pivot Toilet Transfer Equipment: Acupuncturist: Moderate assistance Film/video editor Method: Ambulating, Stand pivot Merchandiser, retail    Praxis   Exercises:   Other Treatments:     Therapy/Group: Individual Therapy  Alen Bleacher 05/03/2019, 11:30 AM

## 2019-05-03 NOTE — Progress Notes (Signed)
Physical Therapy Session Note  Patient Details  Name: Patricia Stanley MRN: [id number] Date of Birth: 03-13-1946  Today's Date: 05/03/2019 PT Individual Time: 1105-1205 PT Individual Time Calculation (min): 60 min   Short Term Goals: Week 1:  PT Short Term Goal 1 (Week 1): Pt will initiate stair training PT Short Term Goal 2 (Week 1): Pt will ambulate 25' w/ min assist PT Short Term Goal 3 (Week 1): Pt will perform stand pivot w/ min assist PT Short Term Goal 4 (Week 1): Pt will maintain dynamic standing balance w/ min assist  Skilled Therapeutic Interventions/Progress Updates: Pt presented in recliner agreeable to therapy. Pt denies pain during session. Performed ambulatory transfer to w/c ambulating approx 66f with CGA and demonstrated correct hand placement during STS transfer. Pt transported to rehab gym for energy conservation. Participated in ascending/descending stairs x 4 with B rails and step to pattern with CGA. Pt ambulated approx 461fto mat with CGA and decreased Trendelenburg of L hip. Participated in standing balance with L foot on air disk with tactile cues to have pt self correct balance to decrease posterior lean. Pt demonstrated improved hip strategy after x3 bouts. Pt also participated in several bouts of horseshoes initially with RW then without and lastly while standing partially on red wedge. Pt required minA to correct posterior lean on red wedge but was able to maintain for short bouts. Performed ambulatory transfer back to w/c CGA and transported back to room. Once back in room performed ambulatory transfer back to recliner. Pt left with call bell within reach, seat alarm on, and needs met.      Therapy Documentation Precautions:  Precautions Precautions: Fall Precaution Comments: L hemi Restrictions Weight Bearing Restrictions: No General:   Vital Signs: Therapy Vitals Temp: 98.3 F (36.8 C) Temp Source: Oral Pulse Rate: 68 Resp: 16 BP: (!) 114/56 Patient  Position (if appropriate): Lying Oxygen Therapy SpO2: 99 % O2 Device: Room Air Pain: Pain Assessment Pain Scale: 0-10 Pain Score: 6     Therapy/Group: Individual Therapy  Taisley Mordan Ocie Stanzione, PTA  05/03/2019, 7:54 AM

## 2019-05-03 NOTE — Progress Notes (Signed)
[place] PHYSICAL MEDICINE & REHABILITATION PROGRESS NOTE   Subjective/Complaints: No new complaints. Says she slept better. Denies pain. Having regular BM. Eating her breakfast well.   ROS: Patient denies fever, rash, sore throat, blurred vision, nausea, vomiting, diarrhea, cough, shortness of breath or chest pain, joint or back pain, headache, or mood change.    Objective:   No results found. No results for input(s): WBC, HGB, HCT, PLT in the last 72 hours. No results for input(s): NA, K, CL, CO2, GLUCOSE, BUN, CREATININE, CALCIUM in the last 72 hours.  Intake/Output Summary (Last 24 hours) at 05/03/2019 0907 Last data filed at 05/03/2019 0858 Gross per 24 hour  Intake 722 ml  Output --  Net 722 ml     Physical Exam: Vital Signs Blood pressure 124/64, pulse 70, temperature 98.3 F (36.8 C), resp. rate 16, weight 44.8 kg, SpO2 99 %.   Constitutional: No distress . Vital signs reviewed. Sitting up in bed eating her breakfast, has almost completed her meal.  HEENT: EOMI, oral membranes moist Neck: supple, right CEA site with bruising and mild swelling Cardiovascular: RRR without murmur. No JVD    Respiratory: CTA Bilaterally without wheezes or rales. Normal effort    GI: BS +, non-tender, non-distended  Extremities: No clubbing, cyanosis, or edema Skin: No evidence of breakdown, no evidence of rash Neurologic: Cranial nerves II through XII intact, motor strength is 5/5 in bilateral deltoid, bicep, tricep, grip, hip flexor, knee extensors, ankle dorsiflexor and plantar flexor Sensory exam normal sensation to light touch and proprioception in bilateral upper and lower extremities Dysmetria on left  Musculoskeletal: Full range of motion in all 4 extremities. No joint swelling Psych: pleasant and up beat  Assessment/Plan: 1. Functional deficits secondary to RIght corona radiata infarct  which require 3+ hours per day of interdisciplinary therapy in a comprehensive inpatient  rehab setting.  Physiatrist is providing close team supervision and 24 hour management of active medical problems listed below.  Physiatrist and rehab team continue to assess barriers to discharge/monitor patient progress toward functional and medical goals  Care Tool:  Bathing    Body parts bathed by patient: Right arm, Left arm, Chest, Abdomen, Front perineal area, Right upper leg, Left upper leg, Right lower leg, Face, Buttocks, Left lower leg   Body parts bathed by helper: Buttocks, Left lower leg     Bathing assist Assist Level: Minimal Assistance - Patient > 75%     Upper Body Dressing/Undressing Upper body dressing   What is the patient wearing?: Pull over shirt    Upper body assist Assist Level: Supervision/Verbal cueing    Lower Body Dressing/Undressing Lower body dressing      What is the patient wearing?: Underwear/pull up, Pants     Lower body assist Assist for lower body dressing: Moderate Assistance - Patient 50 - 74%     Toileting Toileting    Toileting assist Assist for toileting: Minimal Assistance - Patient > 75%     Transfers Chair/bed transfer  Transfers assist     Chair/bed transfer assist level: Maximal Assistance - Patient 25 - 49%     Locomotion Ambulation   Ambulation assist      Assist level: Minimal Assistance - Patient > 75% Assistive device: Walker-rolling Max distance: 50   Walk 10 feet activity   Assist     Assist level: Minimal Assistance - Patient > 75% Assistive device: Walker-rolling   Walk 50 feet activity   Assist Walk 50 feet with 2  turns activity did not occur: Safety/medical concerns  Assist level: Minimal Assistance - Patient > 75% Assistive device: Walker-rolling    Walk 150 feet activity   Assist Walk 150 feet activity did not occur: Safety/medical concerns         Walk 10 feet on uneven surface  activity   Assist Walk 10 feet on uneven surfaces activity did not occur: Safety/medical  concerns         Wheelchair     Assist Will patient use wheelchair at discharge?: (TBD) Type of Wheelchair: Manual    Wheelchair assist level: Moderate Assistance - Patient 50 - 74% Max wheelchair distance: 10'    Wheelchair 50 feet with 2 turns activity    Assist    Wheelchair 50 feet with 2 turns activity did not occur: Safety/medical concerns       Wheelchair 150 feet activity     Assist  Wheelchair 150 feet activity did not occur: Safety/medical concerns       Blood pressure 124/64, pulse 70, temperature 98.3 F (36.8 C), resp. rate 16, weight 44.8 kg, SpO2 99 %.    Medical Problem List and Plan: 1.  Left-sided weakness secondary to right corona radiata infarction   --Continue CIR therapies including PT, OT, and SLP               -patient may shower- PT ,OT              -ELOS 1/5 2.  Antithrombotics: -DVT/anticoagulation: Lovenox             -antiplatelet therapy: Aspirin 325 mg daily and Plavix 75 mg daily x3 months then aspirin alone 3. Pain Management: Hydrocodone as needed  -Yesterday in therapy patient complained of 7/10 aching pain in left leg. Will add Robaxin daily to help limit such pain during therapy sessions.  4. Mood: Klonopin 1 mg every morning 2 mg nightly, mood positive at present             -antipsychotic agents: N/A 5. Neuropsych: This patient is capable of making decisions on her own behalf. 6. Skin/Wound Care: Routine skin checks 7. Fluids/Electrolytes/Nutrition: Routine in and outs, CMET shows hypoalbuminemia 8.  Symptomatic right carotid stenosis.  Status post right carotid enterectomy 04/21/2019. 9.  Hypertension.  Hydralazine 25 mg every 6 hours, Tenormin 50 mg daily, lisinopril 10 mg daily.  Monitor with increased mobility 10.  Unintentional weight loss.  Work-up per GI services.  Protonix 40 mg twice daily.  Dietary follow-up. Monitor appetite improving 11.  Hyperlipidemia.  Lipitor 12.  Anemia, ? Blood loss due to R CEA,  admit Hgb was 12.6    - on ASA , Plavix and enoxaparin  Pt thinks she has seen "a little blood in stool" --stool ob negative  12/26: Hgb improved on CBC to 10.1 13: Dysuria with increased urinary frequency: ordered UA/UC.   12/27: UA negative, dysuria resolved.  UCX with Insig growth 14. Gas: added simethicone prn 15. Insomnia: will schedule Trazodone.   12/28 Seemed better last night   LOS: 8 days A FACE TO FACE EVALUATION WAS PERFORMED  Annabeth Tortora P Xabi Wittler 05/03/2019, 9:07 AM

## 2019-05-04 ENCOUNTER — Inpatient Hospital Stay (HOSPITAL_COMMUNITY): Payer: Medicare HMO | Admitting: Occupational Therapy

## 2019-05-04 ENCOUNTER — Inpatient Hospital Stay (HOSPITAL_COMMUNITY): Payer: Medicare HMO | Admitting: Physical Therapy

## 2019-05-04 ENCOUNTER — Inpatient Hospital Stay (HOSPITAL_COMMUNITY): Payer: Medicare HMO

## 2019-05-04 NOTE — Progress Notes (Signed)
Physical Therapy Weekly Progress Note  Patient Details  Name: Patricia Stanley MRN: [id number] Date of Birth: Apr 08, 1946  Beginning of progress report period: April 26, 2019 End of progress report period: May 04, 2019  Today's Date: 05/04/2019   Patient has met 4 of 4 short term goals.  Pt has made good progress during current course of therapy. She has demonstrated improved L hip stability and improved coordination which has allowed pt to improve gait, balance, and overall safety with functional mobility. Pt is currently at an overall CGA level with transfers, gait, and stairs and is currently on track for supervision level upon d/c.   Patient continues to demonstrate the following deficits muscle weakness and muscle joint tightness and impaired timing and sequencing, unbalanced muscle activation, motor apraxia and decreased coordination and therefore will continue to benefit from skilled PT intervention to increase functional independence with mobility.  Patient progressing toward long term goals..  Continue plan of care.  PT Short Term Goals Week 1:  PT Short Term Goal 1 (Week 1): Pt will initiate stair training PT Short Term Goal 2 (Week 1): Pt will ambulate 25' w/ min assist PT Short Term Goal 3 (Week 1): Pt will perform stand pivot w/ min assist PT Short Term Goal 4 (Week 1): Pt will maintain dynamic standing balance w/ min assist Week 2:  PT Short Term Goal 1 (Week 2): STG=LTG due to ELOS  Skilled Therapeutic Interventions/Progress Updates:    see above  Therapy Documentation Precautions:  Precautions Precautions: Fall Precaution Comments: L hemi Restrictions Weight Bearing Restrictions: No Pain: Pain Assessment Pain Score: 0-No pain   Therapy/Group: Individual Therapy  Rosita DeChalus 05/04/2019, 1:44 PM

## 2019-05-04 NOTE — Progress Notes (Signed)
Physical Therapy Session Note  Patient Details  Name: Patricia Stanley MRN: [id number] Date of Birth: [birth year]  Today's Date: 05/04/2019 PT Individual Time: 0907-1000 PT Individual Time Calculation (min): 53 min   Short Term Goals: Week 1:  PT Short Term Goal 1 (Week 1): Pt will initiate stair training PT Short Term Goal 2 (Week 1): Pt will ambulate 25' w/ min assist PT Short Term Goal 3 (Week 1): Pt will perform stand pivot w/ min assist PT Short Term Goal 4 (Week 1): Pt will maintain dynamic standing balance w/ min assist Week 2:     Skilled Therapeutic Interventions/Progress Updates: Pt presented in bed agreeable to therapy. Pt performed bed mobility with supervision and use of bed features. Pt ambulated to bathroom and performed toilet transfers with CGA and was supervision for LB clothing management (+void). Ambulated to sink and performed hand hygiene with supervision in standing. Pt transferred to w/c and transported to day room. Participated in STS x 5 with RLE on 4 in step for forced use of LLE and strengthening. Performed toe taps to 4in step x 5 bilaterally then alternating for increased wt bearing on LLE and forced use of LLE. Pt transported to rehab gym and participated in ascending/descending x 4 steps with B rails and CGA with verbal cues for sequencing. Pt then participated in L NMR via forced use performing hip abd in standing 2 x 10 bilaterally, and mini squats x 10. Pt then ambulated approx 19f with CGA. Pt transported remaining distance to room and transferred to w/c. Pt left with seat alarm on, call bell within reach and needs met.      Therapy Documentation Precautions:  Precautions Precautions: Fall Precaution Comments: L hemi Restrictions Weight Bearing Restrictions: No    Therapy/Group: Individual Therapy  Teren Zurcher  Monchel Pollitt, PTA  05/04/2019, 1:44 PM

## 2019-05-04 NOTE — Patient Care Conference (Signed)
Inpatient RehabilitationTeam Conference and Plan of Care Update Date: 05/04/2019   Time: 10:05 AM   Patient Name: Patricia Stanley      Medical Record Number: [id number]  Date of Birth: 1946-03-21 Sex: Female         Room/Bed: 4W18C/4W18C-01 Payor Info: Payor: [employer] MEDICARE / Plan: [employer] MEDICARE HMO / Product Type: *No Product type* /    Admit Date/Time:  04/25/2019 10:40 PM  Primary Diagnosis:  Small vessel cerebrovascular accident (CVA) [facility])  Patient Active Problem List   Diagnosis Date Noted  . Moderate episode of recurrent major depressive disorder (HCC)   . Small vessel cerebrovascular accident (CVA) (HCC) 04/25/2019  . Acute CVA (cerebrovascular accident) (HCC) 04/18/2019  . Weight loss, unintentional 04/18/2019  . Anxiety 04/18/2019  . Major depression in partial remission (HCC) 08/25/2012  . Osteoporosis, unspecified 08/25/2012  . HTN (hypertension) 08/25/2012  . Fall at home 08/24/2012  . Pelvic fracture (HCC) 08/24/2012  . Migraines 08/24/2012  . Renal insufficiency, mild 08/24/2012    Expected Discharge Date: Expected Discharge Date: 05/10/19  Team Members Present: Physician leading conference: Dr. Sula Soda Social Worker Present: Dossie Der, LCSW Nurse Present: Jesusita Oka, LPN Case Manager: Roderic Palau, RN PT Present: Harless Litten, PTA;Midge Minium, PT OT Present: Jackquline Denmark, OT SLP Present: Colin Benton, SLP PPS Coordinator present : Fae Pippin, SLP     Current Status/Progress Goal Weekly Team Focus  Bowel/Bladder   pt is continent of b/b. LBM 05/02/19  pt will remain continent x2  Q2h toileting/PRN   Swallow/Nutrition/ Hydration             ADL's   Min A overall at shower level, ambulating with RW  supervision overall  NMR, standing balance, ADL retraining, pt/family education   Mobility   CGA transfers, gait up to 59ft CGA to minA when fatigued, CGA 4 stairs with B rails, continued to demonstrate decreased safety awareness  and delayed righting reactions with dynamic balance  supervision overall, household mobility, except CGA 4 steps w/ unilateral rail  L NMR, balance, endurance, family ed   Communication             Safety/Cognition/ Behavioral Observations  Min-Supervision A  Supervision A  medication management, recall, auditory comprehenison, semi-complex problem solving   Pain   pt c/o 8/10 neck pain/generalized discomfort. PRN Norco effective  pt pain will be less than 4  Assess pain Qshift/ PRN   Skin   No obvious signs of skin breakdown. Surgical icision to R neck w/ skin glue. no signs of drainage/infection.  Pt will be free of skin breakdown and infection.  Assess skin Qshift/PRN      *See Care Plan and progress notes for long and short-term goals.     Barriers to Discharge  Current Status/Progress Possible Resolutions Date Resolved   Nursing                  PT                    OT                  SLP                SW                Discharge Planning/Teaching Needs:  Husband and daughter to come in for educaiton prior to discharge home. Neuro-psych has seen and will follow  Team Discussion: Sleeping well, adjusted bowel meds, doing well.  RN loose BMs.  OT min/minguard RW, showered yesterday, did well, husband present for teaching, goals S.  PT CGA/SBA transfers, gait 50', CGA stairs, decreased safety awareness, S goals.  SLP goals S cognition, now min/S, working on meds and recall, delayed processing, working on [employer]  Has husband and Dtr.   Revisions to Treatment Plan: N/A     Medical Summary Current Status: Pain well controlled, vital signs stable, having loose stools, sleeping better Weekly Focus/Goal: stopped bowel regimen to prevent loose stools, continue therapy  Barriers to Discharge: Medical stability  Barriers to Discharge Comments: loose stools, home safety Possible Resolutions to Barriers: stopped bowel regimen, continue therapies with goal of S at  home   Continued Need for Acute Rehabilitation Level of Care: The patient requires daily medical management by a physician with specialized training in physical medicine and rehabilitation for the following reasons: Direction of a multidisciplinary physical rehabilitation program to maximize functional independence : Yes Medical management of patient stability for increased activity during participation in an intensive rehabilitation regime.: Yes Analysis of laboratory values and/or radiology reports with any subsequent need for medication adjustment and/or medical intervention. : Yes   I attest that I was present, lead the team conference, and concur with the assessment and plan of the team.   Jodell Cipro M 05/04/2019, 8:23 PM  Team conference was held via web/ teleconference due to [place] - 19

## 2019-05-04 NOTE — Plan of Care (Signed)
  Problem: RH Problem Solving Goal: LTG Patient will demonstrate problem solving for (SLP) Description: LTG:  Patient will demonstrate problem solving for basic/complex daily situations with cues  (SLP) Flowsheets (Taken 05/04/2019 0906) LTG Patient will demonstrate problem solving for: Supervision

## 2019-05-04 NOTE — Progress Notes (Signed)
[name] PHYSICAL MEDICINE & REHABILITATION PROGRESS NOTE   Subjective/Complaints: No new complaints. Says she slept well, but was afraid she would have an accident. Denies pain. Having regular BM. Eating her breakfast well.   ROS: Patient denies fever, rash, sore throat, blurred vision, nausea, vomiting, diarrhea, cough, shortness of breath or chest pain, joint or back pain, headache, or mood change.    Objective:   No results found. No results for input(s): WBC, HGB, HCT, PLT in the last 72 hours. No results for input(s): NA, K, CL, CO2, GLUCOSE, BUN, CREATININE, CALCIUM in the last 72 hours.  Intake/Output Summary (Last 24 hours) at 05/04/2019 0844 Last data filed at 05/03/2019 1857 Gross per 24 hour  Intake 684 ml  Output --  Net 684 ml     Physical Exam: Vital Signs Blood pressure (!) 148/72, pulse 73, temperature 98.3 F (36.8 C), temperature source Oral, resp. rate 16, weight 44.8 kg, SpO2 100 %.   Constitutional: No distress . Vital signs reviewed. Sitting up in bed eating her breakfast, has almost completed her meal.  HEENT: EOMI, oral membranes moist Neck: supple, right CEA site with bruising and mild swelling Cardiovascular: RRR without murmur. No JVD    Respiratory: CTA Bilaterally without wheezes or rales. Normal effort    GI: BS +, non-tender, non-distended  Extremities: No clubbing, cyanosis, or edema Skin: No evidence of breakdown, no evidence of rash Neurologic: Cranial nerves II through XII intact, motor strength is 5/5 in bilateral deltoid, bicep, tricep, grip, hip flexor, knee extensors, ankle dorsiflexor and plantar flexor Sensory exam normal sensation to light touch and proprioception in bilateral upper and lower extremities Dysmetria on left  Musculoskeletal: Full range of motion in all 4 extremities. No joint swelling Psych: pleasant and up beat  Assessment/Plan: 1. Functional deficits secondary to RIght corona radiata infarct  which require 3+  hours per day of interdisciplinary therapy in a comprehensive inpatient rehab setting.  Physiatrist is providing close team supervision and 24 hour management of active medical problems listed below.  Physiatrist and rehab team continue to assess barriers to discharge/monitor patient progress toward functional and medical goals  Care Tool:  Bathing    Body parts bathed by patient: Right arm, Left arm, Chest, Abdomen, Front perineal area, Right upper leg, Left upper leg, Right lower leg, Face, Buttocks, Left lower leg   Body parts bathed by helper: Buttocks, Left lower leg     Bathing assist Assist Level: Minimal Assistance - Patient > 75%     Upper Body Dressing/Undressing Upper body dressing   What is the patient wearing?: Pull over shirt    Upper body assist Assist Level: Supervision/Verbal cueing    Lower Body Dressing/Undressing Lower body dressing      What is the patient wearing?: Underwear/pull up, Pants     Lower body assist Assist for lower body dressing: Moderate Assistance - Patient 50 - 74%     Toileting Toileting    Toileting assist Assist for toileting: Minimal Assistance - Patient > 75%     Transfers Chair/bed transfer  Transfers assist     Chair/bed transfer assist level: Contact Guard/Touching assist     Locomotion Ambulation   Ambulation assist      Assist level: Contact Guard/Touching assist Assistive device: Walker-rolling Max distance: 16ft   Walk 10 feet activity   Assist     Assist level: Contact Guard/Touching assist Assistive device: Walker-rolling   Walk 50 feet activity   Assist Walk 50 feet  with 2 turns activity did not occur: Safety/medical concerns  Assist level: Minimal Assistance - Patient > 75% Assistive device: Walker-rolling    Walk 150 feet activity   Assist Walk 150 feet activity did not occur: Safety/medical concerns         Walk 10 feet on uneven surface  activity   Assist Walk 10 feet  on uneven surfaces activity did not occur: Safety/medical concerns         Wheelchair     Assist Will patient use wheelchair at discharge?: (TBD) Type of Wheelchair: Manual    Wheelchair assist level: Moderate Assistance - Patient 50 - 74% Max wheelchair distance: 10'    Wheelchair 50 feet with 2 turns activity    Assist    Wheelchair 50 feet with 2 turns activity did not occur: Safety/medical concerns       Wheelchair 150 feet activity     Assist  Wheelchair 150 feet activity did not occur: Safety/medical concerns       Blood pressure (!) 148/72, pulse 73, temperature 98.3 F (36.8 C), temperature source Oral, resp. rate 16, weight 44.8 kg, SpO2 100 %.    Medical Problem List and Plan: 1.  Left-sided weakness secondary to right corona radiata infarction   --Continue CIR therapies including PT, OT, and SLP               -patient may shower- PT ,OT              -ELOS 1/5 2.  Antithrombotics: -DVT/anticoagulation: Lovenox             -antiplatelet therapy: Aspirin 325 mg daily and Plavix 75 mg daily x3 months then aspirin alone 3. Pain Management: Hydrocodone as needed  -Yesterday in therapy patient complained of 7/10 aching pain in left leg. Will add Robaxin daily to help limit such pain during therapy sessions.  4. Mood: Klonopin 1 mg every morning 2 mg nightly, mood positive at present             -antipsychotic agents: N/A 5. Neuropsych: This patient is capable of making decisions on her own behalf. 6. Skin/Wound Care: Routine skin checks 7. Fluids/Electrolytes/Nutrition: Routine in and outs, CMET shows hypoalbuminemia 8.  Symptomatic right carotid stenosis.  Status post right carotid enterectomy 04/21/2019. 9.  Hypertension.  Hydralazine 25 mg every 6 hours, Tenormin 50 mg daily, lisinopril 10 mg daily.  Monitor with increased mobility 10.  Unintentional weight loss.  Work-up per GI services.  Protonix 40 mg twice daily.  Dietary follow-up. Monitor  appetite improving 11.  Hyperlipidemia.  Lipitor 12.  Anemia, ? Blood loss due to R CEA, admit Hgb was 12.6    - on ASA , Plavix and enoxaparin  Pt thinks she has seen "a little blood in stool" --stool ob negative  12/26: Hgb improved on CBC to 10.1 13: Dysuria with increased urinary frequency: ordered UA/UC.   12/27: UA negative, dysuria resolved.  UCX with Insig growth 14. Gas: added simethicone prn 15. Insomnia: will schedule Trazodone.   12/28 Seemed better last night 16. Bowel regimen: 12/30: patient reports she is having regular BM and woke last night with fear that she would be incontinent. SLP interrupted this morning by need to use bathroom. Will stop docusate since may now be having negative effect.    LOS: 9 days A FACE TO FACE EVALUATION WAS PERFORMED  Patricia Stanley 05/04/2019, 8:44 AM

## 2019-05-04 NOTE — Progress Notes (Signed)
Occupational Therapy Weekly Progress Note  Patient Details  Name: Patricia Stanley MRN: [id number] Date of Birth: 01-31-1946  Beginning of progress report period: April 26, 2019 End of progress report period: May 04, 2019  Today's Date: 05/04/2019 OT Individual Time: 1130-1158 and 1400-1500 OT Individual Time Calculation (min): 28 min and 60 mins   Patient has met 4 of 4 short term goals. Pt currently performing functional transfers and mobility with use of RW and min guard. Pt is utilizing L UE into functional tasks min cuing to do so. AROM is WFLs for L UE but working on [facility], dexterity, speed, control, and strength. Pt has been given exercise program for L UE NMR and husband has began some education in afternoon sessions. Pt requires min cuing for safety awareness but has been making improvements in this area as well.   Patient continues to demonstrate the following deficits: muscle weakness, decreased cardiorespiratoy endurance, decreased coordination and decreased motor planning, decreased problem solving and decreased safety awareness and decreased standing balance, decreased postural control, hemiplegia and decreased balance strategies and therefore will continue to benefit from skilled OT intervention to enhance overall performance with BADL and iADL.  Patient progressing toward long term goals..  Continue plan of care.  OT Short Term Goals Week 1:  OT Short Term Goal 1 (Week 1): Pt will perform toilet transfer with min A OT Short Term Goal 1 - Progress (Week 1): Met OT Short Term Goal 2 (Week 1): Pt will perform toileting with mod A OT Short Term Goal 2 - Progress (Week 1): Met OT Short Term Goal 3 (Week 1): Pt will utlize left hand at diminished level during ADLs with min cues OT Short Term Goal 3 - Progress (Week 1): Met OT Short Term Goal 4 (Week 1): Pt will don shirt with min A OT Short Term Goal 4 - Progress (Week 1): Met Week 2:  OT Short Term Goal 1 (Week 2):  STGs=LTGs secondary to upcoming discharge  Skilled Therapeutic Interventions/Progress Updates:    Session 1: Upon entering the room, pt seated in recliner chair with no c/o pain this session. Pt standing from recliner chair with RW and supervision. Pt verbalized need for toileting and ambulating 10' into bathroom with close supervision and RW. Pt attempting to have BM but was unable. She was able to void urine and performed hygiene while seated. Pt standing with min guard for balance to pull pants up. Pt standing at sink for grooming tasks with close supervision for hand hygiene and combing hair. Pt unable to put hair up without assistance. Pt requesting to return to bed secondary to fatigue. Side stepping to bed with S - min guard and use of RW. Bed mobility with supervision. Call bell and all needed items within reach. Bed alarm activated.   Session 2: Upon entering the room, pt supine in bed with husband present in the room. Pt is agreeable to OT intervention. OT provided pt with paper handout of theraputty exercises. Pt was able to read aloud description of exercise and needing min cuing for proper technique with exercises 1 -16 with yellow resistive putty and use of L UE. OT having to provide several cues this session for pt to not attempt to use R UE. Pt working on several other functional bilateral coordination tasks such as tearing tape, lacing shoes, and tying shoe laces. Pt needing increased time and min cuing for technique. Pt ambulating 76' with RW and min guard. Pt performing toileting as  well this session with min guard and then returning to bed with supervision for bed mobility.Bed alarm activated and call bell within reach.   Therapy Documentation Precautions:  Precautions Precautions: Fall Precaution Comments: L hemi Restrictions Weight Bearing Restrictions: No  Pain: Pain Assessment Pain Score: 0-No pain ADL: ADL Grooming: Maximal assistance Where Assessed-Grooming: Sitting at  sink Upper Body Bathing: Minimal assistance Where Assessed-Upper Body Bathing: Shower Lower Body Bathing: Maximal assistance Where Assessed-Lower Body Bathing: Shower Upper Body Dressing: Minimal assistance Where Assessed-Upper Body Dressing: Wheelchair Lower Body Dressing: Maximal assistance Where Assessed-Lower Body Dressing: Wheelchair Toileting: Moderate assistance Where Assessed-Toileting: Glass blower/designer: Moderate assistance Toilet Transfer Method: Squat pivot Toilet Transfer Equipment: Energy manager: Moderate assistance Social research officer, government Method: Ambulating, Stand pivot Medical sales representative    Praxis   Exercises:   Other Treatments:     Therapy/Group: Individual Therapy  Gypsy Decant 05/04/2019, 1:50 PM

## 2019-05-04 NOTE — Progress Notes (Signed)
Social Work Patient ID: Patricia Stanley, female   DOB: [birth year], 73 y.o.   MRN: [id number]  Met with pt and husband to inform of team conference progress toward her goals of supervision and discharge 1/5. Husband was here yesterday to go through therapies with pt and is aware of 24 hr supervision level.

## 2019-05-04 NOTE — Progress Notes (Signed)
Speech Language Pathology Daily Session Note  Patient Details  Name: Patricia Stanley MRN: [id number] Date of Birth: April 05, 1946  Today's Date: 05/04/2019 SLP Individual Time: 3817-7116 SLP Individual Time Calculation (min): 26 min  Short Term Goals: Week 2: SLP Short Term Goal 1 (Week 2): STG=LTG due to short ELOS  Skilled Therapeutic Interventions:SLP facilitated recall of medication name and function, pt required supervision A verbal cues. SLP also facilitated semi-complex problem solving and emergent awareness skills in familiar cognistat construction task, pt continued to demonstrate delayed processing (taking over 1 minute to complete 3 each task) however able to recreate images 2 out 3 with extra time and supervision A verbal cues  And mod A verbal cues for recall of rules (use 4 tiles only.)  Pt required initial mod A verbal cues to read/oragnize schedule fading to supervision A verbal cues. SLP recommends to continue medication management, auditory comprehension and recall with aids/strategies. Pt was left with call bell with reach and bed alarm set.      Pain Pain Assessment Pain Score: 0-No pain  Therapy/Group: Individual Therapy  Kumar Falwell  [facility] 05/04/2019, 1:44 PM

## 2019-05-05 ENCOUNTER — Inpatient Hospital Stay (HOSPITAL_COMMUNITY): Payer: Medicare HMO | Admitting: Physical Therapy

## 2019-05-05 ENCOUNTER — Inpatient Hospital Stay (HOSPITAL_COMMUNITY): Payer: Medicare HMO | Admitting: Occupational Therapy

## 2019-05-05 ENCOUNTER — Inpatient Hospital Stay (HOSPITAL_COMMUNITY): Payer: Medicare HMO

## 2019-05-05 MED ORDER — HYDRALAZINE HCL 25 MG PO TABS
25.0000 mg | ORAL_TABLET | Freq: Three times a day (TID) | ORAL | Status: DC
  Administered 2019-05-05 – 2019-05-10 (×15): 25 mg via ORAL
  Filled 2019-05-05 (×15): qty 1

## 2019-05-05 NOTE — Progress Notes (Signed)
Physical Therapy Session Note  Patient Details  Name: Patricia Stanley MRN: [id number] Date of Birth: 12-Feb-1946  Today's Date: 05/05/2019 PT Individual Time: 0905-1000 PT Individual Time Calculation (min): 55 min   Short Term Goals: Week 2:  PT Short Term Goal 1 (Week 2): STG=LTG due to ELOS  Skilled Therapeutic Interventions/Progress Updates:   Pt received supine in bed and agreeable to PT. Supine>sit transfer with supervision assist and cues for decrease use of rails.  Stand pivot transfer to [facility] with supervision assist and RW.   Pt transported to rehab gym in [facility]. Gait training with RW 2 x 85f with CGA and multimodal cues for improved posture to decrease compensatory strateigies through R parascapular musculature, and improved pelvic rotation and glute activation. Pt noted to have improved pelvic control on the 2nd bout.   Lateral step up to 6 inch step with BUE support on rail of stairs with min assist to force improved gluteal and eccentric control of the L quads. Sit<>stand with RLE on 2inch step to force WB through the LLE x 5.   Wii fit balance training to improve postural control with min assist overall to complete 2 bouts of penguin slide with max cues for use of ankle strategy over hip strategy to correct and control LLE. Gait training without AD x 487fand min A with HHA on the L. Increasing trendelenberg and pt reports mild pain in hip and L quad.  [facility] to [name].         Therapy Documentation Precautions:  Precautions Precautions: Fall Precaution Comments: L hemi Restrictions Weight Bearing Restrictions: No   Pain: denies   Therapy/Group: Individual Therapy  AuLorie Phenix2/31/2020, 10:05 AM

## 2019-05-05 NOTE — Progress Notes (Signed)
Speech Language Pathology Daily Session Note  Patient Details  Name: Patricia Stanley MRN: [id number] Date of Birth: 1945-07-26  Today's Date: 05/05/2019 SLP Individual Time: 4709-6283 SLP Individual Time Calculation (min): 41 min  Short Term Goals: Week 2: SLP Short Term Goal 1 (Week 2): STG=LTG due to short ELOS  Skilled Therapeutic Interventions: Skilled ST services focused on education and cognitive skills. Pt's husband was present for treatment sessions. SLP facilitated recall and semi-complex problem solving skills in familiar QID pill organizer medication management task, pt required min A verbal cues for problem solving and supervision A error awareness while sorting pills, however required mod A verbal cues for recall during task. SLP provided education to pt and pt's husband pertaining to need for his assistance in medication management and semi-complex task, he agreed. All questions answered to staisfaction. Pt was left in room with call bell within reach and bed alarm set. ST recommends to continue skilled ST services.      Pain Pain Assessment Pain Score: 0-No pain  Therapy/Group: Individual Therapy  Newton Frutiger  [facility] 05/05/2019, 3:23 PM

## 2019-05-05 NOTE — Progress Notes (Signed)
PHYSICAL MEDICINE & REHABILITATION PROGRESS NOTE   Subjective/Complaints: No new complaints. Needs to use the bathroom. Denies pain. Having regular BM. Eating her breakfast well.   ROS: Patient denies fever, rash, sore throat, blurred vision, nausea, vomiting, diarrhea, cough, shortness of breath or chest pain, joint or back pain, headache, or mood change.    Objective:   No results found. No results for input(s): WBC, HGB, HCT, PLT in the last 72 hours. No results for input(s): NA, K, CL, CO2, GLUCOSE, BUN, CREATININE, CALCIUM in the last 72 hours.  Intake/Output Summary (Last 24 hours) at 05/05/2019 0853 Last data filed at 05/04/2019 2033 Gross per 24 hour  Intake 240 ml  Output --  Net 240 ml     Physical Exam: Vital Signs Blood pressure (!) 104/50, pulse 72, temperature 99.7 F (37.6 C), resp. rate 18, weight 44.8 kg, SpO2 96 %. Constitutional: No distress . Vital signs reviewed. Sitting up in bed eating her breakfast. HEENT: EOMI, oral membranes moist Neck: supple, right CEA site with bruising and mild swelling Cardiovascular: RRR without murmur. No JVD    Respiratory: CTA Bilaterally without wheezes or rales. Normal effort    GI: BS +, non-tender, non-distended  Extremities: No clubbing, cyanosis, or edema Skin: No evidence of breakdown, no evidence of rash Neurologic: Cranial nerves II through XII intact, motor strength is 5/5 in bilateral deltoid, bicep, tricep, grip, hip flexor, knee extensors, ankle dorsiflexor and plantar flexor Sensory exam normal sensation to light touch and proprioception in bilateral upper and lower extremities Dysmetria on left Musculoskeletal: Full range of motion in all 4 extremities. No joint swelling Psych: pleasant and upbeat  Assessment/Plan: 1. Functional deficits secondary to RIght corona radiata infarct  which require 3+ hours per day of interdisciplinary therapy in a comprehensive inpatient rehab setting.  Physiatrist  is providing close team supervision and 24 hour management of active medical problems listed below.  Physiatrist and rehab team continue to assess barriers to discharge/monitor patient progress toward functional and medical goals  Care Tool:  Bathing    Body parts bathed by patient: Right arm, Left arm, Chest, Abdomen, Front perineal area, Right upper leg, Left upper leg, Right lower leg, Face, Buttocks, Left lower leg   Body parts bathed by helper: Buttocks, Left lower leg     Bathing assist Assist Level: Minimal Assistance - Patient > 75%     Upper Body Dressing/Undressing Upper body dressing   What is the patient wearing?: Pull over shirt    Upper body assist Assist Level: Supervision/Verbal cueing    Lower Body Dressing/Undressing Lower body dressing      What is the patient wearing?: Underwear/pull up, Pants     Lower body assist Assist for lower body dressing: Moderate Assistance - Patient 50 - 74%     Toileting Toileting    Toileting assist Assist for toileting: Contact Guard/Touching assist     Transfers Chair/bed transfer  Transfers assist     Chair/bed transfer assist level: Contact Guard/Touching assist     Locomotion Ambulation   Ambulation assist      Assist level: Contact Guard/Touching assist Assistive device: Walker-rolling Max distance: 3ft   Walk 10 feet activity   Assist     Assist level: Contact Guard/Touching assist Assistive device: Walker-rolling   Walk 50 feet activity   Assist Walk 50 feet with 2 turns activity did not occur: Safety/medical concerns  Assist level: Minimal Assistance - Patient > 75% Assistive device: Walker-rolling  Walk 150 feet activity   Assist Walk 150 feet activity did not occur: Safety/medical concerns         Walk 10 feet on uneven surface  activity   Assist Walk 10 feet on uneven surfaces activity did not occur: Safety/medical concerns         Wheelchair     Assist  Will patient use wheelchair at discharge?: (TBD) Type of Wheelchair: Manual    Wheelchair assist level: Moderate Assistance - Patient 50 - 74% Max wheelchair distance: 10'    Wheelchair 50 feet with 2 turns activity    Assist    Wheelchair 50 feet with 2 turns activity did not occur: Safety/medical concerns       Wheelchair 150 feet activity     Assist  Wheelchair 150 feet activity did not occur: Safety/medical concerns       Blood pressure (!) 104/50, pulse 72, temperature 99.7 F (37.6 C), resp. rate 18, weight 44.8 kg, SpO2 96 %.    Medical Problem List and Plan: 1.  Left-sided weakness secondary to right corona radiata infarction   --Continue CIR therapies including PT, OT, and SLP               -patient may shower- PT ,OT              -ELOS 1/5 2.  Antithrombotics: -DVT/anticoagulation: Lovenox             -antiplatelet therapy: Aspirin 325 mg daily and Plavix 75 mg daily x3 months then aspirin alone 3. Pain Management: Hydrocodone as needed  -12/29 in therapy patient complained of 7/10 aching pain in left leg. Will add Robaxin daily to help limit such pain during therapy sessions.  4. Mood: Klonopin 1 mg every morning 2 mg nightly, mood positive at present             -antipsychotic agents: N/A 5. Neuropsych: This patient is capable of making decisions on her own behalf. 6. Skin/Wound Care: Routine skin checks 7. Fluids/Electrolytes/Nutrition: Routine in and outs, CMET shows hypoalbuminemia 8.  Symptomatic right carotid stenosis.  Status post right carotid enterectomy 04/21/2019. 9.  Hypertension.  Hydralazine 25 mg every 6 hours, Tenormin 50 mg daily, lisinopril 10 mg daily.  Monitor with increased mobility  12/31: BP soft. Will decrease Hydralazine to TID 10.  Unintentional weight loss.  Work-up per GI services.  Protonix 40 mg twice daily.  Dietary follow-up. Monitor appetite, improving 11.  Hyperlipidemia.  Lipitor 12.  Anemia, ? Blood loss due to R CEA,  admit Hgb was 12.6    - on ASA , Plavix and enoxaparin  Pt thinks she has seen "a little blood in stool" --stool ob negative  12/26: Hgb improved on CBC to 10.1 13: Dysuria with increased urinary frequency: ordered UA/UC.   12/27: UA negative, dysuria resolved.  UCX with Insig growth 14. Gas: added simethicone prn 15. Insomnia: will schedule Trazodone.   12/28 Seemed better last night 16. Bowel regimen: 12/30: patient reports she is having regular BM and woke last night with fear that she would be incontinent. SLP interrupted this morning by need to use bathroom. Will stop docusate since may now be having negative effect.    LOS: 10 days A FACE TO FACE EVALUATION WAS PERFORMED  Patricia Stanley 05/05/2019, 8:53 AM

## 2019-05-05 NOTE — Progress Notes (Signed)
Occupational Therapy Session Note  Patient Details  Name: Patricia Stanley MRN: [id number] Date of Birth: [birth year]  Today's Date: 05/05/2019 OT Individual Time: 1400-1510 OT Individual Time Calculation (min): 70 min    Short Term Goals: Week 2:  OT Short Term Goal 1 (Week 2): STGs=LTGs secondary to upcoming discharge  Skilled Therapeutic Interventions/Progress Updates:  Upon entering the room, pt supine in bed with husband present in the room. Pt with no c/o pain and requesting to shower. Bed mobility performed with supervision. Pt standing with CGA and ambulating 10' to dresser to obtain clothing items for shower. Pt ambulating into bathroom in same manner and seated on commode for toileting needs. Pt doffing clothing items while seated with min cuing for safety awareness. Pt transferred onto TTB with CGA and performed bathing tasks with supervision overall with use of lateral leans to wash buttocks and peri area. Pt utilizing L UE to wash self without cuing. Pt transferred from shower onto commode for dressing tasks. Pt performed UB self care at supervision level and standing with min guard for balance during LB clothing management. Pt performed figure four position to don B socks. Pt seated at sink for grooming tasks and to dry hair. Pt then stepping onto scale with min hand held assistance. Pt weighting 97.1 lbs and husband verbalizing, " Good, she pt on some weight". Pt returning to bed at end of session secondary to fatigue. Bed alarm activated and call bell within reach.    Therapy Documentation Precautions:  Precautions Precautions: Fall Precaution Comments: L hemi Restrictions Weight Bearing Restrictions: No General:   Vital Signs: Therapy Vitals Temp: 98.6 F (37 C) Pulse Rate: 82 Resp: 18 BP: (!) 144/74 Patient Position (if appropriate): Sitting Oxygen Therapy SpO2: 99 % O2 Device: Room Air Pain: Pain Assessment Pain Score: 0-No pain ADL: ADL Grooming: Maximal  assistance Where Assessed-Grooming: Sitting at sink Upper Body Bathing: Minimal assistance Where Assessed-Upper Body Bathing: Shower Lower Body Bathing: Maximal assistance Where Assessed-Lower Body Bathing: Shower Upper Body Dressing: Minimal assistance Where Assessed-Upper Body Dressing: Wheelchair Lower Body Dressing: Maximal assistance Where Assessed-Lower Body Dressing: Wheelchair Toileting: Moderate assistance Where Assessed-Toileting: Glass blower/designer: Moderate assistance Toilet Transfer Method: Squat pivot Toilet Transfer Equipment: Energy manager: Moderate assistance Social research officer, government Method: Ambulating, Stand pivot [employer] Equipment: Transfer tub bench   Therapy/Group: Individual Therapy  Gypsy Decant 05/05/2019, 4:07 PM

## 2019-05-06 ENCOUNTER — Inpatient Hospital Stay (HOSPITAL_COMMUNITY): Payer: Medicare HMO | Admitting: Occupational Therapy

## 2019-05-06 ENCOUNTER — Inpatient Hospital Stay (HOSPITAL_COMMUNITY): Payer: Medicare HMO | Admitting: Physical Therapy

## 2019-05-06 MED ORDER — BISACODYL 10 MG RE SUPP
10.0000 mg | Freq: Once | RECTAL | Status: DC
  Filled 2019-05-06 (×2): qty 1

## 2019-05-06 NOTE — Progress Notes (Signed)
Pt has had several BM's today and refused suppository this am.

## 2019-05-06 NOTE — Progress Notes (Addendum)
Physical Therapy Session Note  Patient Details  Name: Patricia Stanley MRN: [id number] Date of Birth: [birth year]  Today's Date: 05/06/2019 PT Individual Time: 1051-1200 PT Individual Time Calculation (min): 69 min   Short Term Goals: Week 2:  PT Short Term Goal 1 (Week 2): STG=LTG due to ELOS  Skilled Therapeutic Interventions/Progress Updates:  Pt received with NT assisting her with finishing toileting. Pt stands at sink with supervision to perform hand hygiene then returns to sitting in w/c with supervision. Therapist provides assistance for donning ted hose & shoes for time management. Transported pt to gym via w/c dependent assist for time management & pt transfers w/c>mat table without AD & min assist. Pt completed Berg Balance Test & scored 19/56; educated pt on interpretation of score & current fall risk. Patient demonstrates increased fall risk as noted by score of 19/56 on Berg Balance Scale.  (<36= high risk for falls, close to 100%; 37-45 significant >80%; 46-51 moderate >50%; 52-55 lower >25%). Pt engaged in 3" step taps with min/mod assist for balance with task focusing on weight shifting L<>R (pt with slight L lateral lean), dynamic balance, and LLE strengthening & NMR as pt with impaired coordination LLE. Pt then reports need to use restroom so assisted back to room via w/c dependent assist and pt ambulates in/out of bathroom with RW & close supervision, manages clothing with CGA for balance and min assist for brief management when finished toileting, and completes toilet transfer with close supervision. Pt requires extra time on toilet to have BM and stands with CGA to perform peri hygiene. Pt performs hand hygiene standing at sink with supervision. Pt ambulates room<>gym with RW & close supervision with noticeable LLE weakness. Pt utilized kinetron in standing with BUE support with task focusing on LLE strengthening & NMR, weight shifting, and dynamic balance. At end of session pt left  sitting in w/c with chair alarm donned & all needs in reach.   Therapy Documentation Precautions:  Precautions Precautions: Fall Precaution Comments: L hemi Restrictions Weight Bearing Restrictions: No  Pain: Pt denies c/o pain.   Balance: Balance Balance Assessed: Yes Standardized Balance Assessment Standardized Balance Assessment: Berg Balance Test Berg Balance Test Sit to Stand: Needs minimal aid to stand or to stabilize(uses BLE to push against EOM table for sit>stand without BUE support) Standing Unsupported: Able to stand 2 minutes with supervision Sitting with Back Unsupported but Feet Supported on Floor or Stool: Able to sit 2 minutes under supervision Stand to Sit: Uses backs of legs against chair to control descent Transfers: Able to transfer with verbal cueing and /or supervision Standing Unsupported with Eyes Closed: Able to stand 10 seconds with supervision Standing Ubsupported with Feet Together: Needs help to attain position and unable to hold for 15 seconds From Standing, Reach Forward with Outstretched Arm: Reaches forward but needs supervision From Standing Position, Pick up Object from Floor: Able to pick up object, needs supervision From Standing Position, Turn to Look Behind Over each Shoulder: Needs supervision when turning Turn 360 Degrees: Needs assistance while turning Standing Unsupported, Alternately Place Feet on Step/Stool: Needs assistance to keep from falling or unable to try Standing Unsupported, One Foot in Front: Loses balance while stepping or standing Standing on One Leg: Unable to try or needs assist to prevent fall Total Score: 16    Therapy/Group: Individual Therapy  Sandi Mariscal 05/06/2019, 1:12 PM

## 2019-05-06 NOTE — Progress Notes (Signed)
Occupational Therapy Session Note  Patient Details  Name: Patricia Stanley MRN: [id number] Date of Birth: 1945/07/30  Today's Date: 05/06/2019 OT Individual Time: 4715-8063 OT Individual Time Calculation (min): 27 min    Short Term Goals: Week 1:  OT Short Term Goal 1 (Week 1): Pt will perform toilet transfer with min A OT Short Term Goal 1 - Progress (Week 1): Met OT Short Term Goal 2 (Week 1): Pt will perform toileting with mod A OT Short Term Goal 2 - Progress (Week 1): Met OT Short Term Goal 3 (Week 1): Pt will utlize left hand at diminished level during ADLs with min cues OT Short Term Goal 3 - Progress (Week 1): Met OT Short Term Goal 4 (Week 1): Pt will don shirt with min A OT Short Term Goal 4 - Progress (Week 1): Met  Skilled Therapeutic Interventions/Progress Updates:    Pt greeted semi-reclined in bed with nursing finishing giving meds. Pt declined any BADL tasks this am. Pt completed stand-pivot to wc with close supervision. UB there-ex with [employer] arm bike for 10 mins. OT played pt preferred [place] music during There-ex. Pt completed obstacle course wih cone weaving without AD with Min HHA. Pt returned to room and left seated in wc with alarm belt on, call bell in reach and needs met.   Therapy Documentation Precautions:  Precautions Precautions: Fall Precaution Comments: L hemi Restrictions Weight Bearing Restrictions: No Pain: Pain Assessment Pain Scale: 0-10 Pain Score: 4 Pain Type: Acute pain Pain Location: Neck Pain Orientation: Right Pain Radiating Towards: shoulder and arm Pain Descriptors / Indicators: Aching;Sore Pain Frequency: Intermittent Pain Onset: On-going Pain Intervention(s): ;Repositioned   Therapy/Group: Individual Therapy  Valma Cava 05/06/2019, 9:51 AM

## 2019-05-06 NOTE — Progress Notes (Signed)
[name] PHYSICAL MEDICINE & REHABILITATION PROGRESS NOTE   Subjective/Complaints: No new complaints. Denies pain.  She is having difficulty having a BM this morning after straining on the commode and requests a suppository.   ROS: Patient denies fever, rash, sore throat, blurred vision, nausea, vomiting, diarrhea, cough, shortness of breath or chest pain, joint or back pain, headache, or mood change.    Objective:   No results found. No results for input(s): WBC, HGB, HCT, PLT in the last 72 hours. No results for input(s): NA, K, CL, CO2, GLUCOSE, BUN, CREATININE, CALCIUM in the last 72 hours.  Intake/Output Summary (Last 24 hours) at 05/06/2019 0838 Last data filed at 05/06/2019 0600 Gross per 24 hour  Intake 613 ml  Output 200 ml  Net 413 ml     Physical Exam: Vital Signs Blood pressure (!) 146/70, pulse 78, temperature 98.4 F (36.9 C), temperature source Oral, resp. rate 17, weight 44.8 kg, SpO2 97 %. Constitutional: No distress . Vital signs reviewed. HEENT: EOMI, oral membranes moist Neck: supple, right CEA site with bruising and mild swelling Cardiovascular: RRR without murmur. No JVD    Respiratory: CTA Bilaterally without wheezes or rales. Normal effort    GI: BS +, non-tender, non-distended  Extremities: No clubbing, cyanosis, or edema Skin: No evidence of breakdown, no evidence of rash Neurologic: Cranial nerves II through XII intact, motor strength is 5/5 in bilateral deltoid, bicep, tricep, grip, hip flexor, knee extensors, ankle dorsiflexor and plantar flexor Sensory exam normal sensation to light touch and proprioception in bilateral upper and lower extremities Dysmetria on left Musculoskeletal: Full range of motion in all 4 extremities. No joint swelling Psych: pleasant and upbeat  Assessment/Plan: 1. Functional deficits secondary to RIght corona radiata infarct  which require 3+ hours per day of interdisciplinary therapy in a comprehensive inpatient rehab  setting.  Physiatrist is providing close team supervision and 24 hour management of active medical problems listed below.  Physiatrist and rehab team continue to assess barriers to discharge/monitor patient progress toward functional and medical goals  Care Tool:  Bathing    Body parts bathed by patient: Right arm, Left arm, Chest, Abdomen, Front perineal area, Right upper leg, Left upper leg, Right lower leg, Face, Buttocks, Left lower leg   Body parts bathed by helper: Buttocks, Left lower leg     Bathing assist Assist Level: Contact Guard/Touching assist     Upper Body Dressing/Undressing Upper body dressing   What is the patient wearing?: Pull over shirt    Upper body assist Assist Level: Supervision/Verbal cueing    Lower Body Dressing/Undressing Lower body dressing      What is the patient wearing?: Underwear/pull up, Pants     Lower body assist Assist for lower body dressing: Contact Guard/Touching assist     Toileting Toileting    Toileting assist Assist for toileting: Contact Guard/Touching assist     Transfers Chair/bed transfer  Transfers assist     Chair/bed transfer assist level: Contact Guard/Touching assist     Locomotion Ambulation   Ambulation assist      Assist level: Contact Guard/Touching assist Assistive device: Walker-rolling Max distance: 36ft   Walk 10 feet activity   Assist     Assist level: Contact Guard/Touching assist Assistive device: Walker-rolling   Walk 50 feet activity   Assist Walk 50 feet with 2 turns activity did not occur: Safety/medical concerns  Assist level: Minimal Assistance - Patient > 75% Assistive device: Walker-rolling    Walk 150  feet activity   Assist Walk 150 feet activity did not occur: Safety/medical concerns         Walk 10 feet on uneven surface  activity   Assist Walk 10 feet on uneven surfaces activity did not occur: Safety/medical concerns          Wheelchair     Assist Will patient use wheelchair at discharge?: (TBD) Type of Wheelchair: Manual    Wheelchair assist level: Moderate Assistance - Patient 50 - 74% Max wheelchair distance: 10'    Wheelchair 50 feet with 2 turns activity    Assist    Wheelchair 50 feet with 2 turns activity did not occur: Safety/medical concerns       Wheelchair 150 feet activity     Assist  Wheelchair 150 feet activity did not occur: Safety/medical concerns       Blood pressure (!) 146/70, pulse 78, temperature 98.4 F (36.9 C), temperature source Oral, resp. rate 17, weight 44.8 kg, SpO2 97 %.    Medical Problem List and Plan: 1.  Left-sided weakness secondary to right corona radiata infarction   --Continue CIR therapies including PT, OT, and SLP               -patient may shower- PT ,OT              -ELOS 1/5 2.  Antithrombotics: -DVT/anticoagulation: Lovenox             -antiplatelet therapy: Aspirin 325 mg daily and Plavix 75 mg daily x3 months then aspirin alone 3. Pain Management: Hydrocodone as needed  -12/29 in therapy patient complained of 7/10 aching pain in left leg. Will add Robaxin daily to help limit such pain during therapy sessions.  4. Mood: Klonopin 1 mg every morning 2 mg nightly, mood positive at present             -antipsychotic agents: N/A 5. Neuropsych: This patient is capable of making decisions on her own behalf. 6. Skin/Wound Care: Routine skin checks 7. Fluids/Electrolytes/Nutrition: Routine in and outs, CMET shows hypoalbuminemia 8.  Symptomatic right carotid stenosis.  Status post right carotid enterectomy 04/21/2019. 9.  Hypertension.  Hydralazine 25 mg every 6 hours, Tenormin 50 mg daily, lisinopril 10 mg daily.  Monitor with increased mobility  12/31: BP soft. Will decrease Hydralazine to TID  1/1: BP stable.  10.  Unintentional weight loss.  Work-up per GI services.  Protonix 40 mg twice daily.  Dietary follow-up. Monitor appetite,  improving 11.  Hyperlipidemia.  Lipitor 12.  Anemia, ? Blood loss due to R CEA, admit Hgb was 12.6    - on ASA , Plavix and enoxaparin  Pt thinks she has seen "a little blood in stool" --stool ob negative  12/26: Hgb improved on CBC to 10.1 13: Dysuria with increased urinary frequency: ordered UA/UC.   12/27: UA negative, dysuria resolved.  UCX with Insig growth 14. Gas: added simethicone prn 15. Insomnia: will schedule Trazodone.   12/28 Seemed better last night 16. Bowel regimen: 12/30: patient reports she is having regular BM and woke last night with fear that she would be incontinent. SLP interrupted this morning by need to use bathroom. Will stop docusate since may now be having negative effect.   1/1: Constipated this morning and straining, requested suppository, ordered x1.   LOS: 11 days A FACE TO FACE EVALUATION WAS PERFORMED  Clide Deutscher Sharlie Shreffler 05/06/2019, 8:38 AM

## 2019-05-07 ENCOUNTER — Inpatient Hospital Stay (HOSPITAL_COMMUNITY): Payer: Medicare HMO | Admitting: Physical Therapy

## 2019-05-07 NOTE — Progress Notes (Signed)
Physical Therapy Session Note  Patient Details  Name: Patricia Stanley MRN: [id number] Date of Birth: 13-Aug-1945  Today's Date: 05/07/2019 PT Individual Time: 1110-1210 PT Individual Time Calculation (min): 60 min   Short Term Goals: Week 2:  PT Short Term Goal 1 (Week 2): STG=LTG due to ELOS  Skilled Therapeutic Interventions/Progress Updates:   Pt received sitting in WC and agreeable to PT.   Gait training to and  day room 2 x 127f with minA-CGA to improve decreased compensations in R side trunk by reducing WB through RUE and to cue pt to improve hip.knee flexion in LLE advancement. Improved hip flexion/and decreased trendelenburg gait p[attern on second bout.  Sit<>supine on mat table with supervision assist and cues for improved awareness of the LLE.   Supine NMR: heel slides, SLR, bridges, hip abduction, lumbar rotation. Each completed x 15 with multimodal instruction for full ROM, decreased speed and proper set up to reduce trunk compensations. Pt also performed hip abduction in sidelying 2 x 10 with moderate assist to stabilize the trunk and prevent compensations through R side core.   Qped NMR: min assist to stabilize trunk to come into qped. Shoulder flexion to target x 8 BUE with min assist for improved pelvic stability. Forward reach with BUE on therapy ball in tall kneeling x 8 with min assist for safety. Lateral reach with contralateral UE on therapy ball x 7 BUE.   nustep reciprocal movement training with supervision assist and min cues throughout for improved postural alignment and improved symmetry of movement   Pt returned to room and performed ambulatory transfer to bed with supervision assist and RW. Sit>supine completed with supervision assist for safety  and left supine in bed with call bell in reach and all needs met.    Therapy Documentation Precautions:  Precautions Precautions: Fall Precaution Comments: L hemi Restrictions Weight Bearing Restrictions: No Vital  Signs: Therapy Vitals Pulse Rate: 81 BP: 138/62 Patient Position (if appropriate): Lying Pain: Pain Assessment Pain Scale: 0-10 Pain Score: 6  Pain Type: Acute pain Pain Location: Abdomen Pain Orientation: Right;Left;Mid Pain Descriptors / Indicators: Discomfort Pain Frequency: Intermittent Pain Onset: Gradual Pain Intervention(s): Medication (See eMAR)   Therapy/Group: Individual Therapy  ALorie Phenix1/06/2019, 12:16 PM

## 2019-05-07 NOTE — Progress Notes (Signed)
[place] PHYSICAL MEDICINE & REHABILITATION PROGRESS NOTE   Subjective/Complaints:   Pt reports she doesn't know how long or if her neck with truly heal from surgery- explained it should take a few weeks, but should heal well from incision.    ROS: Patient denies fever, rash, sore throat, blurred vision, nausea, vomiting, diarrhea, cough, shortness of breath or chest pain, joint or back pain, headache, or mood change.    Objective:   No results found. No results for input(s): WBC, HGB, HCT, PLT in the last 72 hours. No results for input(s): NA, K, CL, CO2, GLUCOSE, BUN, CREATININE, CALCIUM in the last 72 hours.  Intake/Output Summary (Last 24 hours) at 05/07/2019 1219 Last data filed at 05/07/2019 0805 Gross per 24 hour  Intake 564 ml  Output -  Net 564 ml     Physical Exam: Vital Signs Blood pressure 138/62, pulse 81, temperature 97.9 F (36.6 C), resp. rate 16, weight 44.8 kg, SpO2 98 %. Constitutional: No distress . Vital signs reviewed. HEENT: EOMI, oral membranes moist Neck: supple, right CEA site with bruising and mild swelling- healing slowly Cardiovascular: RRR without murmur. No JVD    Respiratory: CTA Bilaterally without wheezes or rales. Normal effort    GI: BS +, non-tender, non-distended  Extremities: No clubbing, cyanosis, or edema Skin: No evidence of breakdown, no evidence of rash Neurologic:  motor strength is 5/5 in bilateral deltoid, bicep, tricep, grip, hip flexor, knee extensors, ankle dorsiflexor and plantar flexor Sensory exam normal sensation to light touch and proprioception in bilateral upper and lower extremities Dysmetria on left Musculoskeletal: Full range of motion in all 4 extremities. No joint swelling Psych: pleasant and upbeat  Assessment/Plan: 1. Functional deficits secondary to RIght corona radiata infarct  which require 3+ hours per day of interdisciplinary therapy in a comprehensive inpatient rehab setting.  Physiatrist is providing  close team supervision and 24 hour management of active medical problems listed below.  Physiatrist and rehab team continue to assess barriers to discharge/monitor patient progress toward functional and medical goals  Care Tool:  Bathing    Body parts bathed by patient: Right arm, Left arm, Chest, Abdomen, Front perineal area, Right upper leg, Left upper leg, Right lower leg, Face, Buttocks, Left lower leg   Body parts bathed by helper: Buttocks, Left lower leg     Bathing assist Assist Level: Contact Guard/Touching assist     Upper Body Dressing/Undressing Upper body dressing   What is the patient wearing?: Pull over shirt    Upper body assist Assist Level: Supervision/Verbal cueing    Lower Body Dressing/Undressing Lower body dressing      What is the patient wearing?: Underwear/pull up, Pants     Lower body assist Assist for lower body dressing: Contact Guard/Touching assist     Toileting Toileting    Toileting assist Assist for toileting: Contact Guard/Touching assist     Transfers Chair/bed transfer  Transfers assist     Chair/bed transfer assist level: Supervision/Verbal cueing     Locomotion Ambulation   Ambulation assist      Assist level: Contact Guard/Touching assist Assistive device: Walker-rolling Max distance: 125   Walk 10 feet activity   Assist     Assist level: Contact Guard/Touching assist Assistive device: Walker-rolling   Walk 50 feet activity   Assist Walk 50 feet with 2 turns activity did not occur: Safety/medical concerns  Assist level: Contact Guard/Touching assist Assistive device: Walker-rolling    Walk 150 feet activity  Assist Walk 150 feet activity did not occur: Safety/medical concerns         Walk 10 feet on uneven surface  activity   Assist Walk 10 feet on uneven surfaces activity did not occur: Safety/medical concerns         Wheelchair     Assist Will patient use wheelchair at  discharge?: (TBD) Type of Wheelchair: Manual    Wheelchair assist level: Moderate Assistance - Patient 50 - 74% Max wheelchair distance: 10'    Wheelchair 50 feet with 2 turns activity    Assist    Wheelchair 50 feet with 2 turns activity did not occur: Safety/medical concerns       Wheelchair 150 feet activity     Assist  Wheelchair 150 feet activity did not occur: Safety/medical concerns       Blood pressure 138/62, pulse 81, temperature 97.9 F (36.6 C), resp. rate 16, weight 44.8 kg, SpO2 98 %.    Medical Problem List and Plan: 1.  Left-sided weakness secondary to right corona radiata infarction   --Continue CIR therapies including PT, OT, and SLP               -patient may shower- PT ,OT              -ELOS 1/5 2.  Antithrombotics: -DVT/anticoagulation: Lovenox             -antiplatelet therapy: Aspirin 325 mg daily and Plavix 75 mg daily x3 months then aspirin alone 3. Pain Management: Hydrocodone as needed  -12/29 in therapy patient complained of 7/10 aching pain in left leg. Will add Robaxin daily to help limit such pain during therapy sessions.  4. Mood: Klonopin 1 mg every morning 2 mg nightly, mood positive at present             -antipsychotic agents: N/A 5. Neuropsych: This patient is capable of making decisions on her own behalf. 6. Skin/Wound Care: Routine skin checks 7. Fluids/Electrolytes/Nutrition: Routine in and outs, CMET shows hypoalbuminemia 8.  Symptomatic right carotid stenosis.  Status post right carotid enterectomy 04/21/2019. 9.  Hypertension.  Hydralazine 25 mg every 6 hours, Tenormin 50 mg daily, lisinopril 10 mg daily.  Monitor with increased mobility  12/31: BP soft. Will decrease Hydralazine to TID  1/1: BP stable.  10.  Unintentional weight loss.  Work-up per GI services.  Protonix 40 mg twice daily.  Dietary follow-up. Monitor appetite, improving 11.  Hyperlipidemia.  Lipitor 12.  Anemia, ? Blood loss due to R CEA, admit Hgb was  12.6    - on ASA , Plavix and enoxaparin  Pt thinks she has seen "a little blood in stool" --stool ob negative  12/26: Hgb improved on CBC to 10.1 13: Dysuria with increased urinary frequency: ordered UA/UC.   12/27: UA negative, dysuria resolved.  UCX with Insig growth 14. Gas: added simethicone prn 15. Insomnia: will schedule Trazodone.   12/28 Seemed better last night 16. Bowel regimen: 12/30: patient reports she is having regular BM and woke last night with fear that she would be incontinent. SLP interrupted this morning by need to use bathroom. Will stop docusate since may now be having negative effect.   1/1: Constipated this morning and straining, requested suppository, ordered x1.  1/2- 3 BMs this AM- got cleaned out hopefully.   LOS: 12 days A FACE TO FACE EVALUATION WAS PERFORMED  Patricia Stanley 05/07/2019, 12:19 PM

## 2019-05-07 NOTE — Progress Notes (Signed)
Occupational Therapy Session Note  Patient Details  Name: Patricia Stanley MRN: [id number] Date of Birth: [birth year]  Skilled Therapeutic Interventions/Progress Updates:    Pt seen for extra therapy time, politely refusing due to feeling tired and anticipating her husband's arrival in a few minutes. Offered to set up therapy in the room so spouse could be present with pt still politely refusing.   Therapy Documentation Precautions:  Precautions Precautions: Fall Precaution Comments: L hemi Restrictions Weight Bearing Restrictions: No Vital Signs: Therapy Vitals Pulse Rate: 81 BP: 138/62 Patient Position (if appropriate): Lying Pain: Pain Assessment Pain Scale: 0-10 Pain Score: 6  Pain Type: Acute pain Pain Location: Abdomen Pain Orientation: Right;Left;Mid Pain Descriptors / Indicators: Discomfort Pain Frequency: Intermittent Pain Onset: Gradual Pain Intervention(s): Medication (See eMAR) ADL: ADL Grooming: Maximal assistance Where Assessed-Grooming: Sitting at sink Upper Body Bathing: Minimal assistance Where Assessed-Upper Body Bathing: Shower Lower Body Bathing: Maximal assistance Where Assessed-Lower Body Bathing: Shower Upper Body Dressing: Minimal assistance Where Assessed-Upper Body Dressing: Wheelchair Lower Body Dressing: Maximal assistance Where Assessed-Lower Body Dressing: Wheelchair Toileting: Moderate assistance Where Assessed-Toileting: Teacher, adult education: Moderate assistance Toilet Transfer Method: Squat pivot Toilet Transfer Equipment: Acupuncturist: Moderate assistance Film/video editor Method: Ambulating, Stand pivot [employer] Equipment: Transfer tub bench      Therapy/Group: Individual Therapy  Russell Engelstad A Taneshia Lorence 05/07/2019, 12:44 PM

## 2019-05-08 ENCOUNTER — Inpatient Hospital Stay (HOSPITAL_COMMUNITY): Payer: Medicare HMO | Admitting: Occupational Therapy

## 2019-05-08 ENCOUNTER — Inpatient Hospital Stay (HOSPITAL_COMMUNITY): Payer: Medicare HMO | Admitting: Physical Therapy

## 2019-05-08 NOTE — Progress Notes (Signed)
Occupational Therapy Session Note  Patient Details  Name: Patricia Stanley MRN: [id number] Date of Birth: 25-Jul-1945  Today's Date: 05/08/2019 OT Individual Time: 3295-1884 OT Individual Time Calculation (min): 54 min    Short Term Goals: Week 2:  OT Short Term Goal 1 (Week 2): STGs=LTGs secondary to upcoming discharge  Skilled Therapeutic Interventions/Progress Updates:    Upon entering the room, pt supine and sleeping soundly in bed. Pt initially politely refusing secondary to fatigue. Pt reports not sleeping well with headache. She is agreeable with encouragement but declines bathing and dressing tasks. Pt performed bed mobility with supervision and standing to ambulating to bathroom initially with CGA progressing to close supervision. Pt performing toilet transfer, clothing management and hygiene with close supervision as well and use of RW. Pt standing at sink for 12 minutes to comb hair, wash face, and hand hygiene with supervision. Pt performed side stepping with RW to sit on EOB for breakfast. Pt demonstrating opening containers and packages with B hands with increased time. Pt returning to bed with supervision and bed alarm activated. Call bell and all needed items within reach.   Therapy Documentation Precautions:  Precautions Precautions: Fall Precaution Comments: L hemi Restrictions Weight Bearing Restrictions: No Vital Signs: Therapy Vitals Temp: 98.3 F (36.8 C) Temp Source: Oral Pulse Rate: 67 Resp: 18 BP: (!) 122/55 Patient Position (if appropriate): Lying Oxygen Therapy SpO2: 100 % O2 Device: Room Air Pain:   ADL: ADL Grooming: Maximal assistance Where Assessed-Grooming: Sitting at sink Upper Body Bathing: Minimal assistance Where Assessed-Upper Body Bathing: Shower Lower Body Bathing: Maximal assistance Where Assessed-Lower Body Bathing: Shower Upper Body Dressing: Minimal assistance Where Assessed-Upper Body Dressing: Wheelchair Lower Body Dressing:  Maximal assistance Where Assessed-Lower Body Dressing: Wheelchair Toileting: Moderate assistance Where Assessed-Toileting: Teacher, adult education: Moderate assistance Toilet Transfer Method: Squat pivot Toilet Transfer Equipment: Acupuncturist: Moderate assistance Film/video editor Method: Ambulating, Stand pivot [employer] Equipment: Transfer tub bench   Therapy/Group: Individual Therapy  Alen Bleacher 05/08/2019, 7:32 AM

## 2019-05-08 NOTE — Progress Notes (Signed)
[place] PHYSICAL MEDICINE & REHABILITATION PROGRESS NOTE   Subjective/Complaints:  Pt reports doing well- asking about Vit E for neck- in PT this AM.    ROS: Patient denies fever, rash, sore throat, blurred vision, nausea, vomiting, diarrhea, cough, shortness of breath or chest pain, joint or back pain, headache, or mood change.    Objective:   No results found. No results for input(s): WBC, HGB, HCT, PLT in the last 72 hours. No results for input(s): NA, K, CL, CO2, GLUCOSE, BUN, CREATININE, CALCIUM in the last 72 hours.  Intake/Output Summary (Last 24 hours) at 05/08/2019 1130 Last data filed at 05/08/2019 0719 Gross per 24 hour  Intake 520 ml  Output --  Net 520 ml     Physical Exam: Vital Signs Blood pressure 132/64, pulse 78, temperature 98.3 F (36.8 C), temperature source Oral, resp. rate 18, weight 44.8 kg, SpO2 100 %. Constitutional: No distress . Vital signs reviewed. HEENT: EOMI, oral membranes moist Neck: supple, right CEA site with bruising and mild swelling- healing well Cardiovascular: RRR without murmur. No JVD    Respiratory: CTA Bilaterally without wheezes or rales. Normal effort    GI: BS +, non-tender, non-distended  Extremities: No clubbing, cyanosis, or edema Skin: No evidence of breakdown, no evidence of rash Neurologic:  motor strength is 5/5 in bilateral deltoid, bicep, tricep, grip, hip flexor, knee extensors, ankle dorsiflexor and plantar flexor Sensory exam normal sensation to light touch and proprioception in bilateral upper and lower extremities Dysmetria on left Musculoskeletal: Full range of motion in all 4 extremities. No joint swelling Psych: pleasant and upbeat  Assessment/Plan: 1. Functional deficits secondary to RIght corona radiata infarct  which require 3+ hours per day of interdisciplinary therapy in a comprehensive inpatient rehab setting.  Physiatrist is providing close team supervision and 24 hour management of active medical  problems listed below.  Physiatrist and rehab team continue to assess barriers to discharge/monitor patient progress toward functional and medical goals  Care Tool:  Bathing    Body parts bathed by patient: Right arm, Left arm, Chest, Abdomen, Front perineal area, Right upper leg, Left upper leg, Right lower leg, Face, Buttocks, Left lower leg   Body parts bathed by helper: Buttocks, Left lower leg     Bathing assist Assist Level: Contact Guard/Touching assist     Upper Body Dressing/Undressing Upper body dressing   What is the patient wearing?: Pull over shirt    Upper body assist Assist Level: Supervision/Verbal cueing    Lower Body Dressing/Undressing Lower body dressing      What is the patient wearing?: Underwear/pull up, Pants     Lower body assist Assist for lower body dressing: Contact Guard/Touching assist     Toileting Toileting    Toileting assist Assist for toileting: Supervision/Verbal cueing     Transfers Chair/bed transfer  Transfers assist     Chair/bed transfer assist level: Supervision/Verbal cueing     Locomotion Ambulation   Ambulation assist      Assist level: Contact Guard/Touching assist Assistive device: Walker-rolling Max distance: 125   Walk 10 feet activity   Assist     Assist level: Contact Guard/Touching assist Assistive device: Walker-rolling   Walk 50 feet activity   Assist Walk 50 feet with 2 turns activity did not occur: Safety/medical concerns  Assist level: Contact Guard/Touching assist Assistive device: Walker-rolling    Walk 150 feet activity   Assist Walk 150 feet activity did not occur: Safety/medical concerns  Walk 10 feet on uneven surface  activity   Assist Walk 10 feet on uneven surfaces activity did not occur: Safety/medical concerns         Wheelchair     Assist Will patient use wheelchair at discharge?: (TBD) Type of Wheelchair: Manual    Wheelchair assist  level: Moderate Assistance - Patient 50 - 74% Max wheelchair distance: 10'    Wheelchair 50 feet with 2 turns activity    Assist    Wheelchair 50 feet with 2 turns activity did not occur: Safety/medical concerns       Wheelchair 150 feet activity     Assist  Wheelchair 150 feet activity did not occur: Safety/medical concerns       Blood pressure 132/64, pulse 78, temperature 98.3 F (36.8 C), temperature source Oral, resp. rate 18, weight 44.8 kg, SpO2 100 %.    Medical Problem List and Plan: 1.  Left-sided weakness secondary to right corona radiata infarction   --Continue CIR therapies including PT, OT, and SLP               -patient may shower- PT ,OT              -ELOS 1/5 2.  Antithrombotics: -DVT/anticoagulation: Lovenox             -antiplatelet therapy: Aspirin 325 mg daily and Plavix 75 mg daily x3 months then aspirin alone 3. Pain Management: Hydrocodone as needed  -12/29 in therapy patient complained of 7/10 aching pain in left leg. Will add Robaxin daily to help limit such pain during therapy sessions.  4. Mood: Klonopin 1 mg every morning 2 mg nightly, mood positive at present             -antipsychotic agents: N/A 5. Neuropsych: This patient is capable of making decisions on her own behalf. 6. Skin/Wound Care: Routine skin checks 7. Fluids/Electrolytes/Nutrition: Routine in and outs, CMET shows hypoalbuminemia 8.  Symptomatic right carotid stenosis.  Status post right carotid enterectomy 04/21/2019. 9.  Hypertension.  Hydralazine 25 mg every 6 hours, Tenormin 50 mg daily, lisinopril 10 mg daily.  Monitor with increased mobility  12/31: BP soft. Will decrease Hydralazine to TID  1/3: BP stable.  10.  Unintentional weight loss.  Work-up per GI services.  Protonix 40 mg twice daily.  Dietary follow-up. Monitor appetite, improving 11.  Hyperlipidemia.  Lipitor 12.  Anemia, ? Blood loss due to R CEA, admit Hgb was 12.6    - on ASA , Plavix and  enoxaparin  Pt thinks she has seen "a little blood in stool" --stool ob negative  12/26: Hgb improved on CBC to 10.1 13: Dysuria with increased urinary frequency: ordered UA/UC.   12/27: UA negative, dysuria resolved.  UCX with Insig growth 14. Gas: added simethicone prn 15. Insomnia: will schedule Trazodone.   12/28 Seemed better last night 16. Bowel regimen: 12/30: patient reports she is having regular BM and woke last night with fear that she would be incontinent. SLP interrupted this morning by need to use bathroom. Will stop docusate since may now be having negative effect.   1/1: Constipated this morning and straining, requested suppository, ordered x1.  1/2- 3 BMs this AM- got cleaned out hopefully.   LOS: 13 days A FACE TO FACE EVALUATION WAS PERFORMED  Markeeta Scalf 05/08/2019, 11:30 AM

## 2019-05-08 NOTE — Progress Notes (Signed)
Physical Therapy Session Note  Patient Details  Name: Patricia Stanley MRN: [id number] Date of Birth: 03/13/1946  Today's Date: 05/08/2019 PT Individual Time: [phone] and 8412-8208 PT Individual Time Calculation (min): 58 min and 28 min   Short Term Goals: Week 2:  PT Short Term Goal 1 (Week 2): STG=LTG due to ELOS  Skilled Therapeutic Interventions/Progress Updates: Pt presented in bed agreeable to therapy. Pt denies pain during session. Performed supine to sit with bed flat without use of features with supervision. Pt donned shoes with supervision and increased time. Pt requesting to use toilet prior to leaving room. Performed ambulatory transfer to toilet close S with supervision toilet transfers (+void). Pt ambulated to sink to perform hand hygiene with supervision and good use of BUE. Pt then transferred to w/c and transported to ortho gym for energy conservation. Pt performed car transfer supervision, gait up/down ramp and gait on mulch with CGA. Pt then transported to rehab gym and participated in obstacle course weaving through cones and stepping over thresholds with close S. Pt then participated in peg board activity while standing on red wedge. Pt was able to complete task with PTA only providing minimal cues to complete full board but was able to perform correct sequencing with 100% accuracy. Pt ambulated back to room at end of session with RW and close S fading to CGA with fatigue during turns. Pt left in recliner at end of session with call bell within reach and needs met.   Tx2: Pt presented in bed with husband present agreeable to therapy. Pt denies pain during session. Performed bed mobility with supervision and use of bed features and donned sneakers with set up. Pt ambulated to day room with RW and close S. Pt participated in Cybex Kinetron seated at 60 cm/sec for endurance in 1 minute bouts. Pt then performed x 10 cycles in standing for wt shifting and forced use of LLE.  Pt then  became emotional expressing how she was very depressed prior to CVA and that this event has brought her closer to family for which she was thankful. PTA provided emotional support. Pt ambulated back to room at end of session and returned to bed. Pt left in bed with alarm on, call bell within reach and needs met.        Therapy Documentation Precautions:  Precautions Precautions: Fall Precaution Comments: L hemi Restrictions Weight Bearing Restrictions: No General:   Vital Signs: Therapy Vitals Temp: 98.3 F (36.8 C) Pulse Rate: 80 Resp: 18 BP: (!) 111/50 Patient Position (if appropriate): Lying Oxygen Therapy SpO2: 98 % O2 Device: Room Air    Therapy/Group: Individual Therapy  Abass Misener  Lucky Trotta, PTA  05/08/2019, 3:48 PM

## 2019-05-09 ENCOUNTER — Inpatient Hospital Stay (HOSPITAL_COMMUNITY): Payer: Medicare HMO | Admitting: Physical Therapy

## 2019-05-09 ENCOUNTER — Inpatient Hospital Stay (HOSPITAL_COMMUNITY): Payer: Medicare HMO | Admitting: Occupational Therapy

## 2019-05-09 ENCOUNTER — Inpatient Hospital Stay (HOSPITAL_COMMUNITY): Payer: Medicare HMO | Admitting: Speech Pathology

## 2019-05-09 ENCOUNTER — Inpatient Hospital Stay (HOSPITAL_COMMUNITY): Payer: Medicare HMO

## 2019-05-09 MED ORDER — LISINOPRIL 10 MG PO TABS: 10.0000 mg | ORAL_TABLET | Freq: Every day | ORAL | 0 refills | Status: DC

## 2019-05-09 MED ORDER — ATORVASTATIN CALCIUM 80 MG PO TABS: 80.0000 mg | ORAL_TABLET | Freq: Every day | ORAL | 0 refills | Status: DC

## 2019-05-09 MED ORDER — CLOPIDOGREL BISULFATE 75 MG PO TABS: 75.0000 mg | ORAL_TABLET | Freq: Every day | ORAL | 0 refills | Status: DC

## 2019-05-09 MED ORDER — METHOCARBAMOL 500 MG PO TABS: 500.0000 mg | ORAL_TABLET | Freq: Every day | ORAL | 0 refills | Status: DC

## 2019-05-09 MED ORDER — CLONAZEPAM 1 MG PO TABS: ORAL_TABLET | ORAL | 0 refills | Status: DC

## 2019-05-09 MED ORDER — ADULT MULTIVITAMIN W/MINERALS CH: 1.0000 | ORAL_TABLET | Freq: Every day | ORAL | Status: DC

## 2019-05-09 MED ORDER — HYDRALAZINE HCL 25 MG PO TABS: 25.0000 mg | ORAL_TABLET | Freq: Three times a day (TID) | ORAL | 0 refills | Status: DC

## 2019-05-09 MED ORDER — TRAZODONE HCL 50 MG PO TABS: 50.0000 mg | ORAL_TABLET | Freq: Every day | ORAL | 0 refills | Status: DC

## 2019-05-09 MED ORDER — PANTOPRAZOLE SODIUM 40 MG PO TBEC: 40.0000 mg | DELAYED_RELEASE_TABLET | Freq: Two times a day (BID) | ORAL | 0 refills | Status: AC

## 2019-05-09 MED ORDER — HYDROCODONE-ACETAMINOPHEN 5-325 MG PO TABS: 1.0000 | ORAL_TABLET | ORAL | 0 refills | Status: DC | PRN

## 2019-05-09 MED ORDER — ATENOLOL 50 MG PO TABS: 50.0000 mg | ORAL_TABLET | Freq: Every evening | ORAL | 0 refills | Status: DC

## 2019-05-09 NOTE — Discharge Instructions (Signed)
Inpatient Rehab Discharge Instructions  Patricia Stanley Discharge date and time: No discharge date for patient encounter.   Activities/Precautions/ Functional Status: Activity: activity as tolerated Diet: regular diet Wound Care: none needed Functional status:  ___ No restrictions     ___ Walk up steps independently ___ 24/7 supervision/assistance   ___ Walk up steps with assistance ___ Intermittent supervision/assistance  ___ Bathe/dress independently ___ Walk with walker     _x__ Bathe/dress with assistance ___ Walk Independently    ___ Shower independently ___ Walk with assistance    ___ Shower with assistance ___ No alcohol     ___ Return to work/school ________     COMMUNITY REFERRALS UPON DISCHARGE:    Home Health:   PT     OT     ST                      Agency:  [facility]    Phone: [phone]   Medical Equipment/Items Ordered:  Tub bench                                                      Agency/Supplier:  [facility] @ [phone]      Special Instructions: No driving smoking or alcohol  Aspirin 325 mg daily and Plavix 75 mg daily x3 months then aspirin alone    COMMUNITY REFERRALS UPON DISCHARGE:    Home Health:   PT, OT,  SP  Agency:[facility] Phone:[phone]  Date of last service:05/10/2019  Medical Equipment/Items Ordered:TUB BENCH  Agency/Supplier:[facility]   [phone]    STROKE/TIA DISCHARGE INSTRUCTIONS SMOKING Cigarette smoking nearly doubles your risk of having a stroke & is the single most alterable risk factor  If you smoke or have smoked in the last 12 months, you are advised to quit smoking for your health.  Most of the excess cardiovascular risk related to smoking disappears within a year of stopping.  Ask you doctor about anti-smoking medications  [place] Quit Line: 1-800-QUIT NOW  Free Smoking Cessation Classes [phone]  CHOLESTEROL Know your levels; limit fat & cholesterol in your diet  Lipid Panel       Component Value Date/Time   CHOL 152 04/19/2019 0414   TRIG 58 04/19/2019 0414   HDL 53 04/19/2019 0414   CHOLHDL 2.9 04/19/2019 0414   VLDL 12 04/19/2019 0414   LDLCALC 87 04/19/2019 0414      Many patients benefit from treatment even if their cholesterol is at goal.  Goal: Total Cholesterol (CHOL) less than 160  Goal:  Triglycerides (TRIG) less than 150  Goal:  HDL greater than 40  Goal:  LDL (LDLCALC) less than 100   BLOOD PRESSURE American Stroke Association blood pressure target is less that 120/80 mm/Hg  Your discharge blood pressure is:  BP: 127/63  Monitor your blood pressure  Limit your salt and alcohol intake  Many individuals will require more than one medication for high blood pressure  DIABETES (A1c is a blood sugar average for last 3 months) Goal HGBA1c is under 7% (HBGA1c is blood sugar average for last 3 months)  Diabetes: No known diagnosis of diabetes    Lab Results  Component Value Date   HGBA1C 5.8 (H) 04/19/2019     Your HGBA1c can be lowered with medications, healthy  diet, and exercise.  Check your blood sugar as directed by your physician  Call your physician if you experience unexplained or low blood sugars.  PHYSICAL ACTIVITY/REHABILITATION Goal is 30 minutes at least 4 days per week  Activity: Increase activity slowly, Therapies: Physical Therapy: Home Health Return to work:   Activity decreases your risk of heart attack and stroke and makes your heart stronger.  It helps control your weight and blood pressure; helps you relax and can improve your mood.  Participate in a regular exercise program.  Talk with your doctor about the best form of exercise for you (dancing, walking, swimming, cycling).  DIET/WEIGHT Goal is to maintain a healthy weight  Your discharge diet is:  Diet Order            Diet Heart Room service appropriate? Yes with Assist; Fluid consistency: Thin  Diet effective now              liquids Your height is:     Your current weight is: Weight: 44.8 kg Your Body Mass Index (BMI) is:  BMI (Calculated): 18.35  Following the type of diet specifically designed for you will help prevent another stroke.  Your goal weight range is:    Your goal Body Mass Index (BMI) is 19-24.  Healthy food habits can help reduce 3 risk factors for stroke:  High cholesterol, hypertension, and excess weight.  RESOURCES Stroke/Support Group:  Call [phone]   STROKE EDUCATION PROVIDED/REVIEWED AND GIVEN TO PATIENT Stroke warning signs and symptoms How to activate emergency medical system (call 911). Medications prescribed at discharge. Need for follow-up after discharge. Personal risk factors for stroke. Pneumonia vaccine given:  Flu vaccine given:  My questions have been answered, the writing is legible, and I understand these instructions.  I will adhere to these goals & educational materials that have been provided to me after my discharge from the hospital.      My questions have been answered and I understand these instructions. I will adhere to these goals and the provided educational materials after my discharge from the hospital.  Patient/Caregiver Signature _______________________________ Date __________  Clinician Signature _______________________________________ Date __________  Please bring this form and your medication list with you to all your follow-up doctor's appointments.

## 2019-05-09 NOTE — Progress Notes (Signed)
Physical Therapy Session Note  Patient Details  Name: Patricia Stanley MRN: [id number] Date of Birth: 12-Aug-1945  Today's Date: 05/09/2019 PT Individual Time: 0900-0945 PT Individual Time Calculation (min): 45 min   Short Term Goals: Week 2:  PT Short Term Goal 1 (Week 2): STG=LTG due to ELOS  Skilled Therapeutic Interventions/Progress Updates: Pt presented in bed agreeable to therapy. Performed bed mobility supervision assist and donned shoes with set up. Pt then requesting to use bathroom prior to leaving room. Performed ambulatory transfer to toilet supervision with RW and performed toilet transfers with close S. Pt then ambulated to sink and performed hand hygiene with supervision. Pt transaferred to w/c and transported to rehab gym for time management. Transferred to mat supervision level with RW and participated in bouts of horseshoes on level tile and compliant surface. Pt was able to tolerate moderate challenges and demonstrated improved ankle strategy with no LOB. Pt also participated in ascending/descending x 4 steps with B rail and CGA nearing close S. Participated in L NMR via forced use at counter including hip abd/add, hamstring curls, and mini squats. Pt ambulated back to room with RW and close S. Pt then transferred to recliner, pt left with call bell within reach and needs met.      Therapy Documentation Precautions:  Precautions Precautions: Fall Precaution Comments: L hemi Restrictions Weight Bearing Restrictions: No General:   Vital Signs: Therapy Vitals Temp: 98.1 F (36.7 C) Temp Source: Oral Pulse Rate: 82 Resp: 16 BP: 128/61 Patient Position (if appropriate): Lying Oxygen Therapy SpO2: 98 % O2 Device: Room Air Pain:   Mobility:   Locomotion :    Trunk/Postural Assessment :    Balance: Balance Balance Assessed: Yes Dynamic Sitting Balance Dynamic Sitting - Level of Assistance: 5: Stand by assistance(LB dressing sit<stand from toilet) Dynamic  Standing Balance Dynamic Standing - Level of Assistance: 5: Stand by assistance(toileting tasks) Exercises:   Other Treatments:      Therapy/Group: Individual Therapy  Tinley Rought 05/09/2019, 4:19 PM

## 2019-05-09 NOTE — Progress Notes (Signed)
Speech Language Pathology Discharge Summary  Patient Details  Name: Patricia Stanley MRN: [id number] Date of Birth: 1945/09/24  Today's Date: 05/09/2019 SLP Individual Time: 0626-9485 SLP Individual Time Calculation (min): 47 min   Skilled Therapeutic Interventions:  Skilled treatment session focused on cognition goals. SLP facilitated session by providing supervision for completion of medication management task. Pt able to organize medicine within organizer appropriately. She continues to experience difficulty with recall of medicines (this is likely related to some baseline memory deficits). Pt readily provides that her husband and "grown" daughter will be helping her with taking medication as prescribed.     Patient has met 1 of 1 long term goals.  Patient to discharge at overall Supervision;Min level.  Reasons goals not met:   N/A  Clinical Impression/Discharge Summary:   Pt has made great progress over the course of skilled ST and as a result she has met her LTGs. She continues to require overall assistance with cognitive tasks including recall, semi-complex problem solving, attention and awareness of deficits. Education has been completed with pt's husband.   Care Partner:  Caregiver Able to Provide Assistance: Yes  Type of Caregiver Assistance: Physical;Cognitive  Recommendation:  24 hour supervision/assistance      Equipment:   N/A  Reasons for discharge: Treatment goals met   Patient/Family Agrees with Progress Made and Goals Achieved: Yes    [name] 05/09/2019, 11:38 AM

## 2019-05-09 NOTE — Progress Notes (Signed)
Occupational Therapy Session Note  Patient Details  Name: Patricia Stanley MRN: [id number] Date of Birth: 1946/03/13  Today's Date: 05/09/2019 OT Individual Time: 7711-6579 OT Individual Time Calculation (min): 15 min    Short Term Goals: Week 2:  OT Short Term Goal 1 (Week 2): STGs=LTGs secondary to upcoming discharge  Skilled Therapeutic Interventions/Progress Updates:    Pt received supine with no c/o pain. Pt eating breakfast but requesting to use bathroom quickly. Pt completed bed mobility with minor use of bed rails with (S). Pt used RW to stand from EOB and complete ambulatory transfer into the bathroom with (S)-CGA for balance. Increased time required but no assistance for toileting tasks overall. Pt returned to bed and requested to finish eating breakfast. Bed alarm set and all needs met.   Therapy Documentation Precautions:  Precautions Precautions: Fall Precaution Comments: L hemi Restrictions Weight Bearing Restrictions: No   Therapy/Group: Individual Therapy  Curtis Sites 05/09/2019, 6:52 AM

## 2019-05-09 NOTE — Discharge Summary (Signed)
Occupational Therapy Discharge Summary  Patient Details  Name: Patricia Stanley MRN: [id number] Date of Birth: 12-29-1945  Patient has met 11 of 11 long term goals due to improved activity tolerance, improved balance, postural control, functional use of  LEFT upper extremity, improved awareness and improved coordination.  Patient to discharge at overall Supervision level.  Patient's care partner [name] is independent to provide the necessary assistance at discharge and has attended family education.    All goals met.   Recommendation:  Patient will benefit from ongoing skilled OT services in home health setting to continue to advance functional skills in the area of BADL and iADL.  Equipment: TTB  Reasons for discharge: treatment goals met and discharge from hospital  Patient/family agrees with progress made and goals achieved: Yes  OT Discharge Vital Signs Therapy Vitals BP: 128/62 ADL ADL Eating: Modified independent Where Assessed-Eating: Wheelchair Grooming: Supervision/safety Where Assessed-Grooming: Sitting at sink Upper Body Bathing: Supervision/safety Where Assessed-Upper Body Bathing: Shower Lower Body Bathing: Supervision/safety Where Assessed-Lower Body Bathing: Shower Upper Body Dressing: Supervision/safety Where Assessed-Upper Body Dressing: Other (Comment)(sitting on toilet) Lower Body Dressing: Supervision/safety Where Assessed-Lower Body Dressing: Other (Comment)(sit<stand from toilet) Toileting: Supervision/safety Where Assessed-Toileting: Glass blower/designer: Close supervision Toilet Transfer Method: Ambulating(RW) Science writer: Energy manager: Close supervision Social research officer, government Method: Ambulating, Radiographer, therapeutic: Special educational needs teacher  Inattention/Neglect: Does not attend to left side of body(improved since time of eval) Praxis Praxis: Intact Cognition Overall Cognitive Status:  History of cognitive impairments - at baseline Arousal/Alertness: Awake/alert Orientation Level: Oriented X4 Sustained Attention: Appears intact Memory: Impaired Problem Solving: Impaired Sensation Coordination Gross Motor Movements are Fluid and Coordinated: No Fine Motor Movements are Fluid and Coordinated: No Coordination and Movement Description: Lt hemiparesis and L UE ataxia  Finger Nose Finger Test: Ataxic Lt > Rt Motor  Motor Motor: Hemiplegia;Motor apraxia;Ataxia Mobility    Supervision ambulatory bathroom transfers using RW Balance Balance Balance Assessed: Yes Dynamic Sitting Balance Dynamic Sitting - Level of Assistance: 5: Stand by assistance(LB dressing sit<stand from toilet) Dynamic Standing Balance Dynamic Standing - Level of Assistance: 5: Stand by assistance(toileting tasks) Extremity/Trunk Assessment RUE Assessment RUE Assessment: Within Functional Limits General Strength Comments: 4/5 grossly LUE Assessment LUE Assessment: Exceptions to WFL(ataxic, strength 4-/5 grossly)   Azara Gemme A Taro Hidrogo 05/09/2019, 3:06 PM

## 2019-05-09 NOTE — Discharge Summary (Signed)
Physician Discharge Summary  Patient ID: Patricia Stanley MRN: [id number] DOB/AGE: 73/01/1946 73 y.o.  Admit date: 04/25/2019 Discharge date: 05/09/2018  Discharge Diagnoses:  Principal Problem:   Small vessel cerebrovascular accident (CVA) [facility](HCC) Active Problems:   Moderate episode of recurrent major depressive disorder (HCC) DVT prophylaxis Hypertension Hyperlipidemia  Discharged Condition: Stable  Significant Diagnostic Studies: CT ANGIO HEAD W OR WO CONTRAST  Result Date: 04/19/2019 CLINICAL DATA:  Initial evaluation for acute stroke. EXAM: CT ANGIOGRAPHY HEAD AND NECK TECHNIQUE: Multidetector CT imaging of the head and neck was performed using the standard protocol during bolus administration of intravenous contrast. Multiplanar CT image reconstructions and MIPs were obtained to evaluate the vascular anatomy. Carotid stenosis measurements (when applicable) are obtained utilizing NASCET criteria, using the distal internal carotid diameter as the denominator. CONTRAST:  80mL OMNIPAQUE IOHEXOL 350 MG/ML SOLN COMPARISON:  Prior CT and MRI from 04/18/2019. FINDINGS: CTA NECK FINDINGS Aortic arch: Visualized aortic arch of normal caliber with normal 3 vessel morphology. Moderate atherosclerotic change seen about the aortic arch and origin of the great vessels. Atheromatous irregularity with stenoses measuring up to 30% seen at the mid left subclavian artery beyond the takeoff of the left vertebral artery. Visualized right subclavian artery widely patent. Right carotid system: Right CCA tortuous proximally. Scattered atheromatous irregularity within the right CCA without hemodynamically significant stenosis. Mix concentric plaque about the right bifurcation/proximal right ICA. Resultant stenosis of up to 65-70% by NASCET criteria. Right ICA otherwise widely patent distally to the skull base without stenosis, dissection, or occlusion. Left carotid system: Atheromatous plaque at the origin of the left  CCA with short-segment 50% stenosis. Additional moderate approximate 50% diffuse narrowing seen just distally within the proximal left CCA (series 8, image 293). Left CCA otherwise patent to the bifurcation without flow-limiting stenosis. Mild plaque about the left bifurcation and proximal left ICA without hemodynamically significant stenosis. Left ICA otherwise patent distally to the skull base without stenosis, dissection or occlusion. Vertebral arteries: Both vertebral arteries arise from the subclavian arteries. Atheromatous plaque at the origin of the vertebral arteries bilaterally without high-grade stenosis. Vertebral arteries mildly irregular but widely patent distally to the skull base without stenosis, dissection or occlusion. Skeleton: No acute osseous finding. No discrete lytic or blastic osseous lesions. Mild to moderate cervical spondylolysis noted at C5-6. Other neck: No other acute soft tissue abnormality within the neck. No mass lesion or adenopathy. Upper chest: Visualized upper chest demonstrates no acute finding. Subpleural fibrotic changes noted within the partially visualized lungs. Review of the MIP images confirms the above findings CTA HEAD FINDINGS Anterior circulation: Petrous segments patent bilaterally. Scattered atheromatous plaque within the cavernous/supraclinoid ICAs bilaterally, left worse than right. Resultant mild to moderate narrowing at the para clinoid right ICA. There is a short-segment fairly severe stenosis at the supraclinoid left ICA (series 8, image 108). ICA termini well perfused. Right A1 widely patent. Left A1 hypoplastic and/or absent. Normal anterior communicating artery. Multifocal atheromatous irregularity with stenoses seen involving the ACAs bilaterally. Both ICAs are perfused to their distal aspects. Irregular moderate approximate 50% stenosis seen involving the proximal right M1 segment (series 8, image 106). Area of involvement measures approximately 7 mm in  length. Right M1 patent distally. Normal right MCA bifurcation. Distal right MCA branches are well perfused, although demonstrate extensive small vessel atheromatous irregularity. Left M1 irregular with short-segment moderate stenosis at its mid aspect. Negative left MCA bifurcation. Extensive atheromatous irregularity with multifocal stenoses seen involving the distal left MCA branches.  Specific note made of a short-segment severe proximal left M2 stenosis (series 8, image 100). Posterior circulation: Right vertebral artery widely patent to the vertebrobasilar junction without significant stenosis. Patent right PICA. Short-segment severe stenosis seen involving the mid left V4 segment just prior to the takeoff of the left PICA (series 8, image 138). Left PICA patent. Additional short-segment moderate stenosis at the distal left V4 segment just prior to the vertebrobasilar junction. Multifocal atheromatous or layering 80 seen involving the basilar artery with associated mild narrowing at its mid aspect. Superior cerebral arteries patent bilaterally. Both PCAs primarily supplied via the basilar. PCAs are fairly diseased with extensive atheromatous irregularity and moderate to severe multifocal stenoses bilaterally. There is an apparent 2 mm focal outpouching arising from the mid left P2 segment, suspicious for a small aneurysm (series 9, image 108). Venous sinuses: Patent. Anatomic variants: None significant. Review of the MIP images confirms the above findings IMPRESSION: 1. Negative CTA for large vessel occlusion. 2. Extensive mixed plaque about the origin of the right ICA with associated stenosis of up to 65-70% by NASCET criteria. 3. Multifocal moderate 50% stenoses seen involving the origin and proximal left common carotid artery as above. 4. Extensive atheromatous disease throughout the intracranial circulation with multifocal moderate to severe stenoses as above. Notable findings include a severe stenosis at  the supraclinoid left ICA, moderate proximal right M1 and mid left M1 stenoses, with moderate to severe multifocal left V4 stenoses. Both PCAs are heavily diseased with extensive atheromatous irregularity. 5. 2 mm focal outpouching arising from the right PCA as above, suspicious for a small aneurysm. Electronically Signed   By: Rise Mu M.D.   On: 04/19/2019 04:47   DG Chest 2 View  Result Date: 04/19/2019 CLINICAL DATA:  Left-sided weakness, expressive aphasia and confusion for the past 2 days. EXAM: CHEST - 2 VIEW COMPARISON:  08/24/2012 FINDINGS: Grossly unchanged cardiac silhouette and mediastinal contours with atherosclerotic plaque within the thoracic aorta. There is persistent thickening the right paratracheal stripe presumably secondary to prominent vasculature. Lungs remain hyperexpanded with flattening of the diaphragms and diffuse slightly nodular thickening of the pulmonary interstitium. No discrete focal airspace opacities. No pleural effusion or pneumothorax. No evidence of edema. Old/healed fractures involving the posterolateral aspect of the left ninth and tenth ribs. No acute osseous abnormalities. IMPRESSION: 1. Similar findings of lung hyperexpansion and chronic bronchitic change without superimposed acute cardiopulmonary disease. 2.  Aortic Atherosclerosis (ICD10-I70.0). Electronically Signed   By: Simonne Come M.D.   On: 04/19/2019 08:14   CT HEAD WO CONTRAST  Result Date: 04/18/2019 CLINICAL DATA:  Focal neural deficit for more than 6 hours suspected stroke, history hypertension, former smoker EXAM: CT HEAD WITHOUT CONTRAST TECHNIQUE: Contiguous axial images were obtained from the base of the skull through the vertex without intravenous contrast. Sagittal and coronal MPR images reconstructed from axial data set. COMPARISON:  08/24/2012 FINDINGS: Brain: Generalized atrophy. Normal ventricular morphology. No midline shift or mass effect. Small vessel chronic ischemic changes  of deep cerebral white matter. Old BILATERAL caudate lacunar infarcts. Old BILATERAL basal ganglia lacunar infarcts with extension on LEFT into periventricular white matter in the LEFT frontal lobe. No intracranial hemorrhage, mass lesion or evidence of acute infarction. No extra-axial fluid collections. Vascular: Atherosclerotic calcification of internal carotid arteries at skull base Skull: Intact Sinuses/Orbits: Clear Other: N/A IMPRESSION: Atrophy with small vessel chronic ischemic changes of deep cerebral white matter. Old lacunar infarcts as above. No acute intracranial abnormalities. Electronically Signed  By: Ulyses SouthwardMark  Boles M.D.   On: 04/18/2019 14:40   CT ANGIO NECK W OR WO CONTRAST  Result Date: 04/19/2019 CLINICAL DATA:  Initial evaluation for acute stroke. EXAM: CT ANGIOGRAPHY HEAD AND NECK TECHNIQUE: Multidetector CT imaging of the head and neck was performed using the standard protocol during bolus administration of intravenous contrast. Multiplanar CT image reconstructions and MIPs were obtained to evaluate the vascular anatomy. Carotid stenosis measurements (when applicable) are obtained utilizing NASCET criteria, using the distal internal carotid diameter as the denominator. CONTRAST:  80mL OMNIPAQUE IOHEXOL 350 MG/ML SOLN COMPARISON:  Prior CT and MRI from 04/18/2019. FINDINGS: CTA NECK FINDINGS Aortic arch: Visualized aortic arch of normal caliber with normal 3 vessel morphology. Moderate atherosclerotic change seen about the aortic arch and origin of the great vessels. Atheromatous irregularity with stenoses measuring up to 30% seen at the mid left subclavian artery beyond the takeoff of the left vertebral artery. Visualized right subclavian artery widely patent. Right carotid system: Right CCA tortuous proximally. Scattered atheromatous irregularity within the right CCA without hemodynamically significant stenosis. Mix concentric plaque about the right bifurcation/proximal right ICA.  Resultant stenosis of up to 65-70% by NASCET criteria. Right ICA otherwise widely patent distally to the skull base without stenosis, dissection, or occlusion. Left carotid system: Atheromatous plaque at the origin of the left CCA with short-segment 50% stenosis. Additional moderate approximate 50% diffuse narrowing seen just distally within the proximal left CCA (series 8, image 293). Left CCA otherwise patent to the bifurcation without flow-limiting stenosis. Mild plaque about the left bifurcation and proximal left ICA without hemodynamically significant stenosis. Left ICA otherwise patent distally to the skull base without stenosis, dissection or occlusion. Vertebral arteries: Both vertebral arteries arise from the subclavian arteries. Atheromatous plaque at the origin of the vertebral arteries bilaterally without high-grade stenosis. Vertebral arteries mildly irregular but widely patent distally to the skull base without stenosis, dissection or occlusion. Skeleton: No acute osseous finding. No discrete lytic or blastic osseous lesions. Mild to moderate cervical spondylolysis noted at C5-6. Other neck: No other acute soft tissue abnormality within the neck. No mass lesion or adenopathy. Upper chest: Visualized upper chest demonstrates no acute finding. Subpleural fibrotic changes noted within the partially visualized lungs. Review of the MIP images confirms the above findings CTA HEAD FINDINGS Anterior circulation: Petrous segments patent bilaterally. Scattered atheromatous plaque within the cavernous/supraclinoid ICAs bilaterally, left worse than right. Resultant mild to moderate narrowing at the para clinoid right ICA. There is a short-segment fairly severe stenosis at the supraclinoid left ICA (series 8, image 108). ICA termini well perfused. Right A1 widely patent. Left A1 hypoplastic and/or absent. Normal anterior communicating artery. Multifocal atheromatous irregularity with stenoses seen involving the  ACAs bilaterally. Both ICAs are perfused to their distal aspects. Irregular moderate approximate 50% stenosis seen involving the proximal right M1 segment (series 8, image 106). Area of involvement measures approximately 7 mm in length. Right M1 patent distally. Normal right MCA bifurcation. Distal right MCA branches are well perfused, although demonstrate extensive small vessel atheromatous irregularity. Left M1 irregular with short-segment moderate stenosis at its mid aspect. Negative left MCA bifurcation. Extensive atheromatous irregularity with multifocal stenoses seen involving the distal left MCA branches. Specific note made of a short-segment severe proximal left M2 stenosis (series 8, image 100). Posterior circulation: Right vertebral artery widely patent to the vertebrobasilar junction without significant stenosis. Patent right PICA. Short-segment severe stenosis seen involving the mid left V4 segment just prior to the takeoff of  the left PICA (series 8, image 138). Left PICA patent. Additional short-segment moderate stenosis at the distal left V4 segment just prior to the vertebrobasilar junction. Multifocal atheromatous or layering 80 seen involving the basilar artery with associated mild narrowing at its mid aspect. Superior cerebral arteries patent bilaterally. Both PCAs primarily supplied via the basilar. PCAs are fairly diseased with extensive atheromatous irregularity and moderate to severe multifocal stenoses bilaterally. There is an apparent 2 mm focal outpouching arising from the mid left P2 segment, suspicious for a small aneurysm (series 9, image 108). Venous sinuses: Patent. Anatomic variants: None significant. Review of the MIP images confirms the above findings IMPRESSION: 1. Negative CTA for large vessel occlusion. 2. Extensive mixed plaque about the origin of the right ICA with associated stenosis of up to 65-70% by NASCET criteria. 3. Multifocal moderate 50% stenoses seen involving the  origin and proximal left common carotid artery as above. 4. Extensive atheromatous disease throughout the intracranial circulation with multifocal moderate to severe stenoses as above. Notable findings include a severe stenosis at the supraclinoid left ICA, moderate proximal right M1 and mid left M1 stenoses, with moderate to severe multifocal left V4 stenoses. Both PCAs are heavily diseased with extensive atheromatous irregularity. 5. 2 mm focal outpouching arising from the right PCA as above, suspicious for a small aneurysm. Electronically Signed   By: Rise Mu M.D.   On: 04/19/2019 04:47   CT CHEST W CONTRAST  Result Date: 04/21/2019 CLINICAL DATA:  Unintentional weight loss. EXAM: CT CHEST, ABDOMEN, AND PELVIS WITH CONTRAST TECHNIQUE: Multidetector CT imaging of the chest, abdomen and pelvis was performed following the standard protocol during bolus administration of intravenous contrast. CONTRAST:  OMNIPAQUE IOHEXOL 300 MG/ML  SOLN COMPARISON:  12/27/2015 CT of the pelvis. FINDINGS: CT CHEST FINDINGS Cardiovascular: The heart size is normal. There is no significant pericardial effusion. Coronary artery calcifications are noted. There is no large centrally located pulmonary embolism. Detection of smaller pulmonary emboli is limited by technique. There is no thoracic aortic aneurysm. Thoracic aortic calcifications are noted. Mediastinum/Nodes: --No mediastinal or hilar lymphadenopathy. --No axillary lymphadenopathy. --No supraclavicular lymphadenopathy. --Normal thyroid gland. --there is diffuse wall thickening of the lower third of the esophagus. There is pneumomediastinum in the upper mediastinum and low right neck. There is fluid tracking within the fat planes of the upper mediastinum and right lower neck. There may be some overlying skin thickening involving the low right neck. The exact cause of these findings is not clearly identified. Lungs/Pleura: There is scarring and  bronchiectasis at the lung bases bilaterally. This has slightly progressed since the prior study. There is no pneumothorax. No large pleural effusion. There is a small ground-glass airspace opacity in the left lung apex measuring approximately 3 mm (axial series 5, image 30). This is felt to be post infectious in etiology. Reticulations bilaterally which have slightly progressed since the prior study. There is questionable honeycombing involving the right middle lobe and right lower lobe. Musculoskeletal: No chest wall abnormality. No acute or significant osseous findings. CT ABDOMEN PELVIS FINDINGS Hepatobiliary: Again identified is a probable portal venous malformation in the left hepatic lobe, similar to prior study. Normal gallbladder.The common bile duct is somewhat dilated. Pancreas: The pancreatic duct is slightly dilated which has progressed since 2017. Spleen: No splenic laceration or hematoma. Adrenals/Urinary Tract: --Adrenal glands: No adrenal hemorrhage. --Right kidney/ureter: The right kidney is slightly atrophic. --Left kidney/ureter: The left kidney is slightly atrophic. There is a nonobstructing stone in the upper  pole. There is no hydronephrosis. --Urinary bladder: Unremarkable. Stomach/Bowel: --Stomach/Duodenum: No hiatal hernia or other gastric abnormality. Normal duodenal course and caliber. --Small bowel: No dilatation or inflammation. --Colon: The stool burden is large. --Appendix: Not visualized. No right lower quadrant inflammation or free fluid. Vascular/Lymphatic: Atherosclerotic calcification is present within the non-aneurysmal abdominal aorta, without hemodynamically significant stenosis. --No retroperitoneal lymphadenopathy. --No mesenteric lymphadenopathy. --No pelvic or inguinal lymphadenopathy. Reproductive: Status post hysterectomy. No adnexal mass. Other: There is a small amount of free fluid in the patient's pelvis. Calcifications are noted in the bilateral gluteal region and  bilateral anterior proximal thighs. This may be secondary to prior injections at this location. These have slightly progressed since 2014. Musculoskeletal. There is an old healed fracture involving the left inferior pubic ramus. There is no acute displaced fracture or dislocation on this study. There is a possible developing midline decubitus ulcer inferior to the sacrum. There is evidence for pelvic floor laxity. IMPRESSION: 1. There is diffuse wall thickening of the lower third of the esophagus. Findings may be secondary to esophagitis, however a mass is not excluded. Follow-up with outpatient EGD is recommended. The findings are concerning for esophageal perforation. 2. Free fluid and subcutaneous gas in the upper mediastinum and right neck is likely related to the patient's recent carotid endarterectomy. 3. Slight progression of scarring and bronchiectasis at the lung bases bilaterally. Slight progression of subpleural reticulations bilaterally which may be secondary to chronic interstitial lung disease. 4. Slightly dilated pancreatic and common bile ducts, progressed since 2017. Outpatient follow-up with MRCP or ERCP is recommended for further evaluation. Correlation with laboratory studies is recommended. 5. Nonobstructing left nephrolithiasis. 6. Small amount of free fluid in the patient's pelvis. 7. Large stool burden. 8. Possible developing decubitus ulcer. Correlation with physical exam is recommended. 9. Pelvic floor laxity. Aortic Atherosclerosis (ICD10-I70.0). Electronically Signed   By: Constance Holster M.D.   On: 04/21/2019 20:24   MR BRAIN WO CONTRAST  Result Date: 04/18/2019 CLINICAL DATA:  Suspected stroke, left-sided weakness. EXAM: MRI HEAD WITHOUT CONTRAST TECHNIQUE: Multiplanar, multiecho pulse sequences of the brain and surrounding structures were obtained without intravenous contrast. COMPARISON:  Head CT 04/18/2019, head CT 08/24/2012 FINDINGS: Brain: Acute infarct within the right  corona radiata measuring 0.8 x 1.4 x 1.0 cm (series 3, image 34) (series 4, image 17). Corresponding T2/FLAIR hyperintensity at this site. No evidence of intracranial mass. No midline shift or extra-axial fluid collection. Redemonstrated are multiple chronic lacunar infarcts within the left front lobe periventricular white matter, as well as bilateral basal ganglia and thalami. There is a background of otherwise moderate chronic small vessel ischemic disease. There are several chronic microhemorrhages, some supratentorial as well as within the bilateral dentate nuclei. Vascular: Flow voids maintained within the proximal large arterial vessels. Skull and upper cervical spine: No focal marrow lesion Sinuses/Orbits: Visualized orbits demonstrate no acute abnormality. Minimal ethmoid sinus mucosal thickening. Trace fluid within bilateral mastoid air cells. IMPRESSION: 1.4 cm acute infarct within the right corona radiata. Redemonstrated are multiple chronic lacunar infarcts within the left frontal lobe periventricular white matter, as well as bilateral basal ganglia and thalami. Background of otherwise moderate chronic small vessel ischemic disease. Electronically Signed   By: Kellie Simmering DO   On: 04/18/2019 17:32   CT ABDOMEN PELVIS W CONTRAST  Result Date: 04/21/2019 CLINICAL DATA:  Unintentional weight loss. EXAM: CT CHEST, ABDOMEN, AND PELVIS WITH CONTRAST TECHNIQUE: Multidetector CT imaging of the chest, abdomen and pelvis was performed following the standard  protocol during bolus administration of intravenous contrast. CONTRAST:  OMNIPAQUE IOHEXOL 300 MG/ML  SOLN COMPARISON:  12/27/2015 CT of the pelvis. FINDINGS: CT CHEST FINDINGS Cardiovascular: The heart size is normal. There is no significant pericardial effusion. Coronary artery calcifications are noted. There is no large centrally located pulmonary embolism. Detection of smaller pulmonary emboli is limited by technique. There is no thoracic aortic  aneurysm. Thoracic aortic calcifications are noted. Mediastinum/Nodes: --No mediastinal or hilar lymphadenopathy. --No axillary lymphadenopathy. --No supraclavicular lymphadenopathy. --Normal thyroid gland. --there is diffuse wall thickening of the lower third of the esophagus. There is pneumomediastinum in the upper mediastinum and low right neck. There is fluid tracking within the fat planes of the upper mediastinum and right lower neck. There may be some overlying skin thickening involving the low right neck. The exact cause of these findings is not clearly identified. Lungs/Pleura: There is scarring and bronchiectasis at the lung bases bilaterally. This has slightly progressed since the prior study. There is no pneumothorax. No large pleural effusion. There is a small ground-glass airspace opacity in the left lung apex measuring approximately 3 mm (axial series 5, image 30). This is felt to be post infectious in etiology. Reticulations bilaterally which have slightly progressed since the prior study. There is questionable honeycombing involving the right middle lobe and right lower lobe. Musculoskeletal: No chest wall abnormality. No acute or significant osseous findings. CT ABDOMEN PELVIS FINDINGS Hepatobiliary: Again identified is a probable portal venous malformation in the left hepatic lobe, similar to prior study. Normal gallbladder.The common bile duct is somewhat dilated. Pancreas: The pancreatic duct is slightly dilated which has progressed since 2017. Spleen: No splenic laceration or hematoma. Adrenals/Urinary Tract: --Adrenal glands: No adrenal hemorrhage. --Right kidney/ureter: The right kidney is slightly atrophic. --Left kidney/ureter: The left kidney is slightly atrophic. There is a nonobstructing stone in the upper pole. There is no hydronephrosis. --Urinary bladder: Unremarkable. Stomach/Bowel: --Stomach/Duodenum: No hiatal hernia or other gastric abnormality. Normal duodenal course and caliber.  --Small bowel: No dilatation or inflammation. --Colon: The stool burden is large. --Appendix: Not visualized. No right lower quadrant inflammation or free fluid. Vascular/Lymphatic: Atherosclerotic calcification is present within the non-aneurysmal abdominal aorta, without hemodynamically significant stenosis. --No retroperitoneal lymphadenopathy. --No mesenteric lymphadenopathy. --No pelvic or inguinal lymphadenopathy. Reproductive: Status post hysterectomy. No adnexal mass. Other: There is a small amount of free fluid in the patient's pelvis. Calcifications are noted in the bilateral gluteal region and bilateral anterior proximal thighs. This may be secondary to prior injections at this location. These have slightly progressed since 2014. Musculoskeletal. There is an old healed fracture involving the left inferior pubic ramus. There is no acute displaced fracture or dislocation on this study. There is a possible developing midline decubitus ulcer inferior to the sacrum. There is evidence for pelvic floor laxity. IMPRESSION: 1. There is diffuse wall thickening of the lower third of the esophagus. Findings may be secondary to esophagitis, however a mass is not excluded. Follow-up with outpatient EGD is recommended. The findings are concerning for esophageal perforation. 2. Free fluid and subcutaneous gas in the upper mediastinum and right neck is likely related to the patient's recent carotid endarterectomy. 3. Slight progression of scarring and bronchiectasis at the lung bases bilaterally. Slight progression of subpleural reticulations bilaterally which may be secondary to chronic interstitial lung disease. 4. Slightly dilated pancreatic and common bile ducts, progressed since 2017. Outpatient follow-up with MRCP or ERCP is recommended for further evaluation. Correlation with laboratory studies is recommended. 5. Nonobstructing  left nephrolithiasis. 6. Small amount of free fluid in the patient's pelvis. 7. Large  stool burden. 8. Possible developing decubitus ulcer. Correlation with physical exam is recommended. 9. Pelvic floor laxity. Aortic Atherosclerosis (ICD10-I70.0). Electronically Signed   By: Katherine Mantle M.D.   On: 04/21/2019 20:24   ECHOCARDIOGRAM COMPLETE  Result Date: 04/19/2019   ECHOCARDIOGRAM REPORT   Patient Name:   GRACELYNN BIRCHER Date of Exam: 04/19/2019 Medical Rec #:  [id number]        Height:       61.5 in Accession #:    [id number]       Weight:       93.5 lb Date of Birth:  [birth year]         BSA:          1.38 m Patient Age:    73 years         BP:           97/60 mmHg Patient Gender: F                HR:           56 bpm. Exam Location:  Inpatient Procedure: 2D Echo, Cardiac Doppler and Color Doppler Indications:    Stroke 434.91  History:        Patient has no prior history of Echocardiogram examinations.                 Stroke; Risk Factors:Hypertension and Former Smoker.  Sonographer:    Tonia Ghent RDCS Referring Phys: [id number] VASUNDHRA RATHORE IMPRESSIONS  1. Left ventricular ejection fraction, by visual estimation, is 60 to 65%. The left ventricle has normal function. There is no left ventricular hypertrophy.  2. Left ventricular diastolic parameters are consistent with Grade II diastolic dysfunction (pseudonormalization).  3. The left ventricle has no regional wall motion abnormalities.  4. Global right ventricle has normal systolic function.The right ventricular size is normal. No increase in right ventricular wall thickness.  5. Left atrial size was normal.  6. Right atrial size was normal.  7. The mitral valve is normal in structure. No evidence of mitral valve regurgitation. No evidence of mitral stenosis.  8. The tricuspid valve is normal in structure. Tricuspid valve regurgitation is trivial.  9. The aortic valve is normal in structure. Aortic valve regurgitation is not visualized. No evidence of aortic valve sclerosis or stenosis. 10. The pulmonic valve was normal in  structure. Pulmonic valve regurgitation is not visualized. 11. Normal pulmonary artery systolic pressure. 12. The inferior vena cava is normal in size with greater than 50% respiratory variability, suggesting right atrial pressure of 3 mmHg. FINDINGS  Left Ventricle: Left ventricular ejection fraction, by visual estimation, is 60 to 65%. The left ventricle has normal function. The left ventricle has no regional wall motion abnormalities. There is no left ventricular hypertrophy. Left ventricular diastolic parameters are consistent with Grade II diastolic dysfunction (pseudonormalization). Normal left atrial pressure. Right Ventricle: The right ventricular size is normal. No increase in right ventricular wall thickness. Global RV systolic function is has normal systolic function. The tricuspid regurgitant velocity is 1.74 m/s, and with an assumed right atrial pressure  of 3 mmHg, the estimated right ventricular systolic pressure is normal at 15.1 mmHg. Left Atrium: Left atrial size was normal in size. Right Atrium: Right atrial size was normal in size Pericardium: There is no evidence of pericardial effusion. Mitral Valve: The mitral valve is normal in structure. No evidence of mitral  valve regurgitation. No evidence of mitral valve stenosis by observation. Tricuspid Valve: The tricuspid valve is normal in structure. Tricuspid valve regurgitation is trivial. Aortic Valve: The aortic valve is normal in structure. Aortic valve regurgitation is not visualized. The aortic valve is structurally normal, with no evidence of sclerosis or stenosis. Pulmonic Valve: The pulmonic valve was normal in structure. Pulmonic valve regurgitation is not visualized. Pulmonic regurgitation is not visualized. Aorta: The aortic root, ascending aorta and aortic arch are all structurally normal, with no evidence of dilitation or obstruction. Venous: The inferior vena cava is normal in size with greater than 50% respiratory variability,  suggesting right atrial pressure of 3 mmHg. IAS/Shunts: No atrial level shunt detected by color flow Doppler. There is no evidence of a patent foramen ovale. No ventricular septal defect is seen or detected. There is no evidence of an atrial septal defect.  LEFT VENTRICLE PLAX 2D LVIDd:         3.57 cm       Diastology LVIDs:         2.39 cm       LV e' lateral:   8.49 cm/s LV PW:         0.78 cm       LV E/e' lateral: 9.6 LV IVS:        0.78 cm       LV e' medial:    5.98 cm/s LVOT diam:     1.40 cm       LV E/e' medial:  13.6 LV SV:         33 ml LV SV Index:   24.77 LVOT Area:     1.54 cm  LV Volumes (MOD) LV area d, A2C:    21.40 cm LV area d, A4C:    20.40 cm LV area s, A2C:    12.20 cm LV area s, A4C:    11.60 cm LV major d, A2C:   6.47 cm LV major d, A4C:   6.42 cm LV major s, A2C:   5.38 cm LV major s, A4C:   5.45 cm LV vol d, MOD A2C: 58.5 ml LV vol d, MOD A4C: 54.0 ml LV vol s, MOD A2C: 23.1 ml LV vol s, MOD A4C: 21.0 ml LV SV MOD A2C:     35.4 ml LV SV MOD A4C:     54.0 ml LV SV MOD BP:      34.2 ml RIGHT VENTRICLE RV S prime:     12.80 cm/s TAPSE (M-mode): 2.1 cm LEFT ATRIUM             Index       RIGHT ATRIUM          Index LA diam:        2.70 cm 1.96 cm/m  RA Area:     8.86 cm LA Vol (A2C):   24.6 ml 17.89 ml/m RA Volume:   16.30 ml 11.85 ml/m LA Vol (A4C):   37.7 ml 27.41 ml/m LA Biplane Vol: 32.1 ml 23.34 ml/m  AORTIC VALVE LVOT Vmax:   106.00 cm/s LVOT Vmean:  71.200 cm/s LVOT VTI:    0.270 m  AORTA Ao Root diam: 2.70 cm MITRAL VALVE                        TRICUSPID VALVE MV Area (PHT): 2.95 cm             TR Peak grad:  12.1 mmHg MV PHT:        74.53 msec           TR Vmax:        174.00 cm/s MV Decel Time: 257 msec MV E velocity: 81.40 cm/s 103 cm/s  SHUNTS MV A velocity: 63.40 cm/s 70.3 cm/s Systemic VTI:  0.27 m MV E/A ratio:  1.28       1.5       Systemic Diam: 1.40 cm  Rachelle Hora Croitoru MD Electronically signed by Thurmon Fair MD Signature Date/Time: 04/19/2019/3:35:50 PM     Final    VAS US CAROTID  Result Date: 04/23/2019 Carotid Arterial Duplex Study Indications: Stenosis on CTA. Performing Technologist: Levada Schilling RDMS, RVT  Examination Guidelines: A complete evaluation includes B-mode imaging, spectral Doppler, color Doppler, and power Doppler as needed of all accessible portions of each vessel. Bilateral testing is considered an integral part of a complete examination. Limited examinations for reoccurring indications may be performed as noted.  Right Carotid Findings: +----------+-------+--------+--------+--------------------------------+--------+           PSV    EDV cm/sStenosisPlaque Description              Comments           cm/s                                                            +----------+-------+--------+--------+--------------------------------+--------+ CCA Prox  81     19                                                       +----------+-------+--------+--------+--------------------------------+--------+ CCA Mid                          diffuse and smooth                       +----------+-------+--------+--------+--------------------------------+--------+ CCA Distal84     24                                                       +----------+-------+--------+--------+--------------------------------+--------+ ICA Prox  166    45      40-59%  calcific, heterogenous and                                                diffuse                                  +----------+-------+--------+--------+--------------------------------+--------+ ICA Mid   161    52      40-59%  heterogenous and diffuse                 +----------+-------+--------+--------+--------------------------------+--------+ ICA Distal74     29                                                       +----------+-------+--------+--------+--------------------------------+--------+  ECA       178    16                                                        +----------+-------+--------+--------+--------------------------------+--------+ +----------+--------+-------+--------+-------------------+           PSV cm/sEDV cmsDescribeArm Pressure (mmHG) +----------+--------+-------+--------+-------------------+ Subclavian115                                        +----------+--------+-------+--------+-------------------+ +---------+--------+--+--------+--+---------+ VertebralPSV cm/s71EDV cm/s24Antegrade +---------+--------+--+--------+--+---------+  Left Carotid Findings: +----------+--------+--------+--------+---------------------+------------------+           PSV cm/sEDV cm/sStenosisPlaque Description   Comments           +----------+--------+--------+--------+---------------------+------------------+ CCA Prox  107     16                                                      +----------+--------+--------+--------+---------------------+------------------+ CCA Mid                                                intimal thickening +----------+--------+--------+--------+---------------------+------------------+ CCA Distal86      18              diffuse, hyperechoic                                                      and smooth                              +----------+--------+--------+--------+---------------------+------------------+ ICA Prox  48      14              heterogenous,                                                             calcific and focal                      +----------+--------+--------+--------+---------------------+------------------+ ICA Distal50      19                                                      +----------+--------+--------+--------+---------------------+------------------+ ECA       147     16              calcific                                +----------+--------+--------+--------+---------------------+------------------+  +----------+--------+--------+--------+-------------------+  PSV cm/sEDV cm/sDescribeArm Pressure (mmHG) +----------+--------+--------+--------+-------------------+ Subclavian199                                         +----------+--------+--------+--------+-------------------+ +---------+--------+--+--------+--+---------+ VertebralPSV cm/s87EDV cm/s16Antegrade +---------+--------+--+--------+--+---------+  Summary: Right Carotid: Velocities in the right ICA are consistent with a 40-59%                stenosis. IMAGES 27-56 are of the right carotid system. Left Carotid: Velocities in the left ICA are consistent with a 1-39% stenosis.               IMAGES 1-26 are mislabled, 1-26 are images of the left carotid               system!!!. Vertebrals: Bilateral vertebral arteries demonstrate antegrade flow. *See table(s) above for measurements and observations.  Electronically signed by Delia Heady MD on 04/23/2019 at 12:41:49 PM.    Final    DG ESOPHAGUS W SINGLE CM (SOL OR THIN BA)  Result Date: 04/22/2019 CLINICAL DATA:  Pneumomediastinum.  Recent carotid end arterectomy. EXAM: ESOPHOGRAM/BARIUM SWALLOW TECHNIQUE: Single contrast examination was performed using  water-soluble. FLUOROSCOPY TIME:  Fluoroscopy Time:  0 minutes and 39 seconds. Radiation Exposure Index (if provided by the fluoroscopic device): Number of Acquired Spot Images: COMPARISON:  CT chest 04/21/2019 FINDINGS: Patient was placed in a shallow LPO position relative to the fluoro table with fluoro table and 30 degree head up position. She was given sips of water soluble contrast material and monitored fluoroscopically while swallowing. This reveals a tortuous esophagus without evidence for esophageal leak, diverticulum, or stricture. No esophageal mass lesion evident. Tertiary contractions are noted in the mid and distal esophagus without evidence for presbyesophagus. IMPRESSION: No evidence for esophageal leak. No  esophageal mass lesion evident on this single contrast water-soluble study. Electronically Signed   By: Kennith Center M.D.   On: 04/22/2019 14:22    Labs:  Basic Metabolic Panel: No results for input(s): NA, K, CL, CO2, GLUCOSE, BUN, CREATININE, CALCIUM, MG, PHOS in the last 168 hours.  CBC: No results for input(s): WBC, NEUTROABS, HGB, HCT, MCV, PLT in the last 168 hours.  CBG: No results for input(s): GLUCAP in the last 168 hours.  Family history.  Mother and father with hypertension as well as hyperlipidemia.  Denies any diabetes mellitus or colon cancer  Brief HPI:   LENETTE RAU is a 74 y.o. right-handed female with history of hypertension, depression, migraine headaches and quit smoking 23 years ago.  Per chart review lives with spouse ambulatory in the home but has not left the house in over a year was very sedentary as well as reports of significant weight loss over the past year.  Patient presented 04/18/2019 with left-sided weakness x3 days with difficulty in speech and facial droop.  Noted blood pressure systolics in the 180s.  Cranial CT scan negative.  Patient did not receive TPA.  MRI of the brain showed a 1.4 cm acute infarction within the right corona radiata in addition to multiple chronic lacunar infarcts within the left frontal lobe periventricular white matter as well as bilateral basal ganglia and thalami.  CTA of head and neck with extensive plaque right ICA with 65 to 70% stenosis as well as carotid Dopplers with right ICA 40 to 59% stenosis.  Underwent emergent right carotid enterectomy per vascular surgery Dr. Chestine Spore 04/21/2019.  Neurology consulted maintain  on aspirin 325 mg as well as Plavix x3 months then aspirin alone.  Patient also maintained on Lovenox for DVT prophylaxis.  Echocardiogram with ejection fraction of 65% without emboli.  Gastroenterology consulted 04/22/2019 for history of unintentional weight loss with CT of abdomen showing no definite malignancy but  did show pneumomediastinum and fluid in the upper mediastinum and lower right neck consistent with postsurgical changes but in addition did show distal esophageal thickening and raising the question of esophagitis.  Patient did have an endoscopy and colonoscopy 3-1/2 years ago by Dr. Laural Benes that showed endoscopy normal specifically without evidence of esophagitis.  Colonoscopy negative.  Patient underwent esophagram barium swallow showing no evidence of esophageal leak.  No mass noted.  Patient was admitted for a comprehensive rehab program   Hospital Course: BIRTIE FELLMAN was admitted to rehab 04/25/2019 for inpatient therapies to consist of PT, ST and OT at least three hours five days a week. Past admission physiatrist, therapy team and rehab RN have worked together to provide customized collaborative inpatient rehab.  Pertaining to patient's right corona radiata infarction remained stable maintained on aspirin and Plavix x3 months then aspirin alone.  Patient would follow-up with neurology services.  Subcutaneous Lovenox for DVT prophylaxis no bleeding episodes.  Pain managed with use of hydrocodone only as needed using a limited basis.  Mood stabilization with the use of Klonopin as well as emotional support provided.  She did have some bouts of insomnia.  Blood pressures controlled with Tenormin, hydralazine as well as lisinopril and patient was advised to follow-up with primary MD.  Noted unintentional weight loss work-up per GI services unremarkable patient remained on Protonix appetite continued to improve.  In regards to latest right carotid surgery patient would follow-up vascular surgery site clean and dry.  Occasional urinary frequency urinalysis negative dysuria resolved urine culture insignificant growth patient remained afebrile.   Blood pressures were monitored on TID basis and controlled  Francis Dowse is continent of bowel and bladder.  Francis Dowse has made gains during rehab stay and is attending  therapies  Francis Dowse will continue to receive follow up therapies   after discharge  Rehab course: During patient's stay in rehab weekly team conferences were held to monitor patient's progress, set goals and discuss barriers to discharge. At admission, patient required moderate assist ambulate 8 feet rolling walker, moderate assist stand pivot transfers, minimal assist sit to supine and supine to sit.  Moderate assist upper body bathing, moderate assist upper body dressing, total assist lower body dressing, +2 physical assist toilet transfers  Physical exam.  Blood pressure 127/63 pulse 66 temperature 98 respirations 16 oxygen saturation 97% room air General.  Alert and oriented HEENT.  Head is normocephalic and atraumatic Eyes.  Pupils round and reactive to light without discharge no nystagmus Neck.  Supple nontender no JVD without thyromegaly Cardiac regular rate rhythm without any murmurs rubs or gallops Respiratory.  Effort normal no respiratory distress without wheezes GI.  Soft nontender positive bowel sounds without rebound Extremities.  No clubbing cyanosis or edema.  Positive pulses Skin.  Clean and intact Neurologically.  Patient alert no acute distress makes good eye contact follows commands fair medical historian.  4 out of 5 strength right upper extremity right lower extremity left lower extremity 3 out of 5 strength left upper extremity.  Francis Dowse  has had improvement in activity tolerance, balance, postural control as well as ability to compensate for deficits. Francis Dowse has had improvement in functional use RUE/LUE  and RLE/LLE  as well as improvement in awareness.  Working with energy conservation techniques.  Perform supine to sit with bed flat without use of features and supervision.  Donned shoes with supervision and increased time.  Perform amatory transfer to the toilet with close supervision and supervision toilet transfers.  Ambulates to the sink to perform hand hygiene and supervision.   Perform car transfers supervision up-and-down ramp and gait contact-guard assist.  Patient can gather belongings for activities day living and homemaking.  Full family teaching completed plan discharge to home       Disposition: Discharge to home    Diet: Regular  Special Instructions: No driving smoking or alcohol  Continue aspirin 325 mg daily and Plavix 75 mg daily x3 months then aspirin alone  Medications at discharge 1.  Tylenol as needed 2.  Aspirin 325 mg p.o. daily 3.  Tenormin 50 mg p.o. daily 4.  Lipitor 80 mg p.o. daily 5.  Klonopin 1 mg every morning and 2 mg nightly 6.  Plavix and 5 mg p.o. daily 7.  Hydralazine 25 mg p.o. every 8 hours 8.  Hydrocodone 1-2 tabs every 4 hours as needed pain 9.  Lisinopril 10 mg p.o. daily 10.  Robaxin 500 mg p.o. daily 11.  Multivitamin daily 12.  Protonix 40 mg p.o. twice daily 13.  Trazodone 50 mg p.o. nightly  Discharge Instructions    Ambulatory referral to Neurology   Complete by: As directed    An appointment is requested in approximately 4 weeks right corona radiata infarction   Ambulatory referral to Physical Medicine Rehab   Complete by: As directed    Moderate complexity follow-up 1 to 2 weeks right corona radiata infarction      Follow-up Information    Kirsteins, Victorino Sparrow, MD Follow up.   Specialty: Physical Medicine and Rehabilitation Why: Office to call for appointment Contact information: [address] [place] [address] [phone]        Cephus Shelling, MD Follow up.   Specialty: Vascular Surgery Why: Call for appointment Contact information: [address] [place] [address] [phone]        Bernette Redbird, MD Follow up.   Specialty: Gastroenterology Why: Call for appointment as needed Contact information: [address] [place] [address] [phone]           Signed: Mcarthur Rossetti Calla Wedekind 05/10/2019, 5:38 AM

## 2019-05-09 NOTE — Care Management (Addendum)
   The overall goal for the admission was met for:   Discharge location: Yes; discharge to home accompanied by spouse/daughter  Length of Stay: 15 days with discharge set for 05/20/2019  Discharge activity level: Overall Supervision level  Home/community participation: Yes  Services provided included: MD, RD, PT, OT, SLP, RN, CM, Pharmacy, Neuropsych and [place]: Medicare [employer]  Follow-up services arranged: Home Health: [facility], DME: tub transfer bench from [employer] and Patient/Family has no preference for HH/DME agencies. [facility] Referral for PT, OT and SLP evaluation/treat.  Comments (or additional information): Elisabeth Strom [name] Belenda Cruise [phone] Lemmie Evens) [phone] (M) Patient/Family verbalized understanding of follow-up arrangements: Yes. Patient feels ready for discharge and is "tickled pink" to be going home.  Individual responsible for coordination of the follow-up plan: Patient's care partner [name] is independent to provide the necessary assistance at discharge and has attended family education.     Confirmed correct DME delivered: Margarito Liner 05/09/2019    Margarito Liner

## 2019-05-09 NOTE — Progress Notes (Signed)
[place] PHYSICAL MEDICINE & REHABILITATION PROGRESS NOTE   Subjective/Complaints:   Feels ready to go home tomorrow    ROS: Patient denies CP, SOB, N/V/D    Objective:   No results found. No results for input(s): WBC, HGB, HCT, PLT in the last 72 hours. No results for input(s): NA, K, CL, CO2, GLUCOSE, BUN, CREATININE, CALCIUM in the last 72 hours.  Intake/Output Summary (Last 24 hours) at 05/09/2019 0827 Last data filed at 05/08/2019 1841 Gross per 24 hour  Intake 342 ml  Output --  Net 342 ml     Physical Exam: Vital Signs Blood pressure 126/60, pulse 76, temperature 98 F (36.7 C), resp. rate 18, weight 44.8 kg, SpO2 98 %. Constitutional: No distress . Vital signs reviewed. HEENT: EOMI, oral membranes moist Neck: supple, right CEA site with bruising and mild swelling- healing well Cardiovascular: RRR without murmur. No JVD    Respiratory: CTA Bilaterally without wheezes or rales. Normal effort    GI: BS +, non-tender, non-distended  Extremities: No clubbing, cyanosis, or edema Skin: No evidence of breakdown, no evidence of rash Neurologic:  motor strength is 5/5 in bilateral deltoid, bicep, tricep, grip, hip flexor, knee extensors, ankle dorsiflexor and plantar flexor Sensory exam normal sensation to light touch and proprioception in bilateral upper and lower extremities Dysmetria on left Musculoskeletal: Full range of motion in all 4 extremities. No joint swelling Psych: pleasant and upbeat  Assessment/Plan: 1. Functional deficits secondary to Right corona radiata infarct  which require 3+ hours per day of interdisciplinary therapy in a comprehensive inpatient rehab setting.  Physiatrist is providing close team supervision and 24 hour management of active medical problems listed below.  Physiatrist and rehab team continue to assess barriers to discharge/monitor patient progress toward functional and medical goals  Care Tool:  Bathing    Body parts bathed by  patient: Right arm, Left arm, Chest, Abdomen, Front perineal area, Right upper leg, Left upper leg, Right lower leg, Face, Buttocks, Left lower leg   Body parts bathed by helper: Buttocks, Left lower leg     Bathing assist Assist Level: Contact Guard/Touching assist     Upper Body Dressing/Undressing Upper body dressing   What is the patient wearing?: Pull over shirt    Upper body assist Assist Level: Supervision/Verbal cueing    Lower Body Dressing/Undressing Lower body dressing      What is the patient wearing?: Underwear/pull up, Pants     Lower body assist Assist for lower body dressing: Contact Guard/Touching assist     Toileting Toileting    Toileting assist Assist for toileting: Supervision/Verbal cueing     Transfers Chair/bed transfer  Transfers assist     Chair/bed transfer assist level: Supervision/Verbal cueing     Locomotion Ambulation   Ambulation assist      Assist level: Supervision/Verbal cueing Assistive device: Walker-rolling Max distance: 164ft   Walk 10 feet activity   Assist     Assist level: Supervision/Verbal cueing Assistive device: Walker-rolling   Walk 50 feet activity   Assist Walk 50 feet with 2 turns activity did not occur: Safety/medical concerns  Assist level: Supervision/Verbal cueing Assistive device: Walker-rolling    Walk 150 feet activity   Assist Walk 150 feet activity did not occur: Safety/medical concerns         Walk 10 feet on uneven surface  activity   Assist Walk 10 feet on uneven surfaces activity did not occur: Safety/medical concerns   Assist level: Contact Guard/Touching assist  Assistive device: Photographer Will patient use wheelchair at discharge?: (TBD) Type of Wheelchair: Manual    Wheelchair assist level: Moderate Assistance - Patient 50 - 74% Max wheelchair distance: 10'    Wheelchair 50 feet with 2 turns activity    Assist     Wheelchair 50 feet with 2 turns activity did not occur: Safety/medical concerns       Wheelchair 150 feet activity     Assist  Wheelchair 150 feet activity did not occur: Safety/medical concerns       Blood pressure 126/60, pulse 76, temperature 98 F (36.7 C), resp. rate 18, weight 44.8 kg, SpO2 98 %.    Medical Problem List and Plan: 1.  Left-sided weakness secondary to right corona radiata infarction   --Continue CIR therapies including PT, OT, and SLP               -patient may shower- PT ,OT              -ELOS 1/5 2.  Antithrombotics: -DVT/anticoagulation: Lovenox             -antiplatelet therapy: Aspirin 325 mg daily and Plavix 75 mg daily x3 months then aspirin alone 3. Pain Management: Hydrocodone as needed  -12/29 in therapy patient complained of 7/10 aching pain in left leg. Will add Robaxin daily to help limit such pain during therapy sessions.  4. Mood: Klonopin 1 mg every morning 2 mg nightly, mood positive at present             -antipsychotic agents: N/A 5. Neuropsych: This patient is capable of making decisions on her own behalf. 6. Skin/Wound Care: Routine skin checks 7. Fluids/Electrolytes/Nutrition: Routine in and outs, CMET shows hypoalbuminemia 8.  Symptomatic right carotid stenosis.  Status post right carotid enterectomy 04/21/2019. 9.  Hypertension.  Hydralazine 25 mg every 6 hours, Tenormin 50 mg daily, lisinopril 10 mg daily.  Monitor with increased mobility  12/31: BP soft. Will decrease Hydralazine to TID  1/3: BP stable.  10.  Unintentional weight loss.  Work-up per GI services.  Protonix 40 mg twice daily.  Dietary follow-up. Monitor appetite, improving 11.  Hyperlipidemia.  Lipitor 12.  Anemia, ? Blood loss due to R CEA, admit Hgb was 12.6    - on ASA , Plavix and enoxaparin  Pt thinks she has seen "a little blood in stool" --stool ob negative  12/26: Hgb improved on CBC to 10.1 13. Constipation Improved  At least daily BMs last several  days   LOS: 14 days A FACE TO FACE EVALUATION WAS PERFORMED  Erick Colace 05/09/2019, 8:27 AM

## 2019-05-09 NOTE — Progress Notes (Signed)
Occupational Therapy Session Note  Patient Details  Name: Patricia Stanley MRN: [id number] Date of Birth: 1945/12/25  Today's Date: 05/09/2019 OT Individual Time: 1300-1414 OT Individual Time Calculation (min): 74 min   Short Term Goals: Week 2:  OT Short Term Goal 1 (Week 2): STGs=LTGs secondary to upcoming discharge  Skilled Therapeutic Interventions/Progress Updates:    Pt greeted in bed, finishing up lunch with no c/o pain. Educated spouse Patricia Stanley on OT safety recommendations, including having 24/7 supervision during functional transfers and self care tasks. Also discussed safety cues she needs to complete ADL tasks at max level of independence. Patricia Stanley verbalized understanding, stated he's been present during several OT sessions and knows her CLOF, plans to assist her with the L UE HEPs at home. Pt completed toileting, bathing at shower level, dressing, and grooming tasks during session. All functional transfers completed with supervision assist using RW. Supervision for all BADL tasks today with vcs for sitting vs standing at appropriate times for safety and using hemi strategies during dressing. Min vcs for increasing functional use of Lt as well, and Patricia Stanley provided this cuing when she was brushing and blow-drying hair at the sink after. Provided pt with a walker bag to attach to her walker at home, explained functional use with spouse and pt. At end of session Patricia Stanley assisted pt with ambulatory transfer to flat bed without bedrails per setup at home. Once again, using RW. Patricia Stanley was educated that he cannot assist pt with transfers at [facility]. At end of session pt was left in bed with all needs and bed alarm set, in care of RN.    Therapy Documentation Precautions:  Precautions Precautions: Fall Precaution Comments: L hemi Restrictions Weight Bearing Restrictions: No Vital Signs: Therapy Vitals BP: 128/62 ADL: ADL Eating: Modified independent Where Assessed-Eating: Wheelchair Grooming:  Supervision/safety Where Assessed-Grooming: Sitting at sink Upper Body Bathing: Supervision/safety Where Assessed-Upper Body Bathing: Shower Lower Body Bathing: Supervision/safety Where Assessed-Lower Body Bathing: Shower Upper Body Dressing: Supervision/safety Where Assessed-Upper Body Dressing: Other (Comment)(sitting on toilet) Lower Body Dressing: Supervision/safety Where Assessed-Lower Body Dressing: Other (Comment)(sit<stand from toilet) Toileting: Supervision/safety Where Assessed-Toileting: Teacher, adult education: Close supervision Toilet Transfer Method: Ambulating(RW) Acupuncturist: Acupuncturist: Close supervision Film/video editor Method: Ambulating, Stand pivot [employer]: Transfer tub bench      Therapy/Group: Individual Therapy  Marijke Guadiana A Florella Mcneese 05/09/2019, 3:10 PM

## 2019-05-09 NOTE — Progress Notes (Signed)
Physical Therapy Discharge Summary  Patient Details  Name: Patricia Stanley MRN: [id number] Date of Birth: 18-Jul-1945  Today's Date: 05/09/2019 PT Individual Time: 0900-0945 PT Individual Time Calculation (min): 45 min    Patient has met 10 of 10 long term goals due to improved activity tolerance, improved balance, improved postural control, increased strength, improved attention, improved awareness and improved coordination.  Patient to discharge at an ambulatory level Supervision.   Patient's care partner is independent to provide the necessary physical assistance at discharge.  Reasons goals not met: N/A all goals met  Recommendation:  Patient will benefit from ongoing skilled PT services in home health setting to continue to advance safe functional mobility, address ongoing impairments in balance, safety, transfers, gait, coordination, strength, and minimize fall risk.  Equipment: No equipment provided  Reasons for discharge: treatment goals met  Patient/family agrees with progress made and goals achieved: Yes  PT Discharge Precautions/Restrictions   Vital Signs Therapy Vitals Temp: 98.1 F (36.7 C) Temp Source: Oral Pulse Rate: 82 Resp: 16 BP: 128/61 Patient Position (if appropriate): Lying Oxygen Therapy SpO2: 98 % O2 Device: Room Air Pain   Vision/Perception  Perception Inattention/Neglect: Does not attend to left side of body(improved since time of eval) Praxis Praxis: Intact  Cognition Overall Cognitive Status: History of cognitive impairments - at baseline Arousal/Alertness: Awake/alert Orientation Level: Oriented X4 Sustained Attention: Appears intact Memory: Impaired Problem Solving: Impaired Sensation Sensation Light Touch: Appears Intact Proprioception: Impaired by gross assessment;Impaired Detail Coordination Gross Motor Movements are Fluid and Coordinated: No Fine Motor Movements are Fluid and Coordinated: No Coordination and Movement  Description: Lt hemiparesis and L UE ataxia Finger Nose Finger Test: Ataxic Lt > Rt Motor  Motor Motor: Hemiplegia;Motor apraxia;Ataxia Motor - Discharge Observations: improved apraxia and ataxia  Mobility Bed Mobility Bed Mobility: Rolling Right;Rolling Left;Supine to Sit;Sit to Supine Rolling Right: Supervision/verbal cueing Rolling Left: Supervision/Verbal cueing Supine to Sit: Supervision/Verbal cueing Sit to Supine: Supervision/Verbal cueing Transfers Transfers: Sit to Stand;Stand to Sit Sit to Stand: Supervision/Verbal cueing Stand to Sit: Supervision/Verbal cueing Transfer (Assistive device): Rolling walker Locomotion  Gait Ambulation: Yes Gait Assistance: Supervision/Verbal cueing Gait Distance (Feet): 125 Feet Assistive device: Rolling walker Gait Assistance Details: improved swing through of LLE Gait Gait: Yes Gait Pattern: Impaired Gait Pattern: Narrow base of support;Decreased stride length Stairs / Additional Locomotion Stairs: Yes Stairs Assistance: Contact Guard/Touching assist Stair Management Technique: Two rails Number of Stairs: 4 Height of Stairs: 6 Ramp: Supervision/Verbal cueing Curb: Cytogeneticist Wheelchair Mobility: No  Trunk/Postural Assessment  Cervical Assessment Cervical Assessment: Within Functional Limits Thoracic Assessment Thoracic Assessment: Exceptions to [facility] Lumbar Assessment Lumbar Assessment: Within Functional Limits Postural Control Postural Control: Within Functional Limits  Balance Balance Balance Assessed: Yes Static Sitting Balance Static Sitting - Level of Assistance: 6: Modified independent (Device/Increase time) Dynamic Sitting Balance Dynamic Sitting - Level of Assistance: 5: Stand by assistance Static Standing Balance Static Standing - Level of Assistance: 5: Stand by assistance Dynamic Standing Balance Dynamic Standing - Level of Assistance: 5: Stand by assistance Extremity  Assessment  RUE Assessment RUE Assessment: Within Functional Limits General Strength Comments: 4/5 grossly LUE Assessment LUE Assessment: Exceptions to WFL(ataxic, strength 4-/5 grossly) RLE Assessment RLE Assessment: Within Functional Limits General Strength Comments: Globally 4/5 LLE Assessment LLE Assessment: Within Functional Limits    Lorie Phenix 05/09/2019, 4:29 PM

## 2019-05-10 NOTE — Progress Notes (Signed)
[place] PHYSICAL MEDICINE & REHABILITATION PROGRESS NOTE   Subjective/Complaints:   Feels ready to go home today   ROS: Patient denies CP, SOB, N/V/D    Objective:   No results found. No results for input(s): WBC, HGB, HCT, PLT in the last 72 hours. No results for input(s): NA, K, CL, CO2, GLUCOSE, BUN, CREATININE, CALCIUM in the last 72 hours.  Intake/Output Summary (Last 24 hours) at 05/10/2019 0816 Last data filed at 05/09/2019 1928 Gross per 24 hour  Intake 622 ml  Output --  Net 622 ml     Physical Exam: Vital Signs Blood pressure 122/60, pulse 76, temperature 97.8 F (36.6 C), temperature source Oral, resp. rate 18, weight 43.7 kg, SpO2 96 %. Constitutional: No distress . Vital signs reviewed. HEENT: EOMI, oral membranes moist Neck: supple, right CEA site with bruising and mild swelling- healing well Cardiovascular: RRR without murmur. No JVD    Respiratory: CTA Bilaterally without wheezes or rales. Normal effort    GI: BS +, non-tender, non-distended  Extremities: No clubbing, cyanosis, or edema Skin: No evidence of breakdown, no evidence of rash Neurologic:  motor strength is 5/5 in bilateral deltoid, bicep, tricep, grip, hip flexor, knee extensors, ankle dorsiflexor and plantar flexor Sensory exam normal sensation to light touch and proprioception in bilateral upper and lower extremities Dysmetria on left Musculoskeletal: Full range of motion in all 4 extremities. No joint swelling Psych: pleasant and upbeat  Assessment/Plan: 1. Functional deficits secondary to Right corona radiata infarct  Stable for D/C today F/u PCP in 3-4 weeks F/u PM&R 2 weeks See D/C summary See D/C instructions Care Tool:  Bathing    Body parts bathed by patient: Right arm, Left arm, Chest, Abdomen, Front perineal area, Right upper leg, Left upper leg, Right lower leg, Face, Buttocks, Left lower leg   Body parts bathed by helper: Buttocks, Left lower leg     Bathing assist  Assist Level: Supervision/Verbal cueing     Upper Body Dressing/Undressing Upper body dressing   What is the patient wearing?: Pull over shirt    Upper body assist Assist Level: Supervision/Verbal cueing    Lower Body Dressing/Undressing Lower body dressing      What is the patient wearing?: Underwear/pull up, Pants     Lower body assist Assist for lower body dressing: Supervision/Verbal cueing     Toileting Toileting    Toileting assist Assist for toileting: Supervision/Verbal cueing     Transfers Chair/bed transfer  Transfers assist     Chair/bed transfer assist level: Supervision/Verbal cueing     Locomotion Ambulation   Ambulation assist      Assist level: Supervision/Verbal cueing Assistive device: Walker-rolling Max distance: 183ft   Walk 10 feet activity   Assist     Assist level: Supervision/Verbal cueing Assistive device: Walker-rolling   Walk 50 feet activity   Assist Walk 50 feet with 2 turns activity did not occur: Safety/medical concerns  Assist level: Supervision/Verbal cueing Assistive device: Walker-rolling    Walk 150 feet activity   Assist Walk 150 feet activity did not occur: Safety/medical concerns         Walk 10 feet on uneven surface  activity   Assist Walk 10 feet on uneven surfaces activity did not occur: Safety/medical concerns   Assist level: Contact Guard/Touching assist Assistive device: Photographer Will patient use wheelchair at discharge?: No Type of Wheelchair: Manual    Wheelchair assist level: Moderate Assistance - Patient  50 - 74% Max wheelchair distance: 10'    Wheelchair 50 feet with 2 turns activity    Assist    Wheelchair 50 feet with 2 turns activity did not occur: Safety/medical concerns       Wheelchair 150 feet activity     Assist  Wheelchair 150 feet activity did not occur: Safety/medical concerns       Blood pressure 122/60,  pulse 76, temperature 97.8 F (36.6 C), temperature source Oral, resp. rate 18, weight 43.7 kg, SpO2 96 %.    Medical Problem List and Plan: 1.  Left-sided weakness secondary to right corona radiata infarction   D/c home today 2.  Antithrombotics: -DVT/anticoagulation: Lovenox             -antiplatelet therapy: Aspirin 325 mg daily and Plavix 75 mg daily x3 months then aspirin alone 3. Pain Management: Hydrocodone as needed  -12/29 in therapy patient complained of 7/10 aching pain in left leg. Will add Robaxin daily to help limit such pain during therapy sessions.  4. Mood: Klonopin 1 mg every morning 2 mg nightly, mood positive at present             -antipsychotic agents: N/A 5. Neuropsych: This patient is capable of making decisions on her own behalf. 6. Skin/Wound Care: Routine skin checks 7. Fluids/Electrolytes/Nutrition: Routine in and outs, CMET shows hypoalbuminemia 8.  Symptomatic right carotid stenosis.  Status post right carotid enterectomy 04/21/2019. 9.  Hypertension.  Hydralazine 25 mg every 6 hours, Tenormin 50 mg daily, lisinopril 10 mg daily.  Monitor with increased mobility  12/31: BP soft. Will decrease Hydralazine to TID  1/3: BP stable.  10.  Unintentional weight loss.  Work-up per GI services.  Protonix 40 mg twice daily.  Dietary follow-up. Monitor appetite, improving 11.  Hyperlipidemia.  Lipitor 12.  Anemia, ? Blood loss due to R CEA, admit Hgb was 12.6    - on ASA , Plavix and enoxaparin  Pt thinks she has seen "a little blood in stool" --stool ob negative  12/26: Hgb improved on CBC to 10.1 13. Constipation Improved  At least daily BMs last several days   LOS: 15 days A FACE TO FACE EVALUATION WAS PERFORMED  Charlett Blake 05/10/2019, 8:16 AM

## 2019-05-10 NOTE — Progress Notes (Signed)
Patient discharged to home, accompanied by her husband. 

## 2019-05-12 ENCOUNTER — Telehealth: Payer: Self-pay | Admitting: Registered Nurse

## 2019-05-12 DIAGNOSIS — M810 Age-related osteoporosis without current pathological fracture: Secondary | ICD-10-CM | POA: Diagnosis not present

## 2019-05-12 DIAGNOSIS — Z48812 Encounter for surgical aftercare following surgery on the circulatory system: Secondary | ICD-10-CM | POA: Diagnosis not present

## 2019-05-12 DIAGNOSIS — I69354 Hemiplegia and hemiparesis following cerebral infarction affecting left non-dominant side: Secondary | ICD-10-CM | POA: Diagnosis not present

## 2019-05-12 DIAGNOSIS — F331 Major depressive disorder, recurrent, moderate: Secondary | ICD-10-CM | POA: Diagnosis not present

## 2019-05-12 DIAGNOSIS — N289 Disorder of kidney and ureter, unspecified: Secondary | ICD-10-CM | POA: Diagnosis not present

## 2019-05-12 DIAGNOSIS — I10 Essential (primary) hypertension: Secondary | ICD-10-CM | POA: Diagnosis not present

## 2019-05-12 DIAGNOSIS — G43909 Migraine, unspecified, not intractable, without status migrainosus: Secondary | ICD-10-CM | POA: Diagnosis not present

## 2019-05-12 DIAGNOSIS — F419 Anxiety disorder, unspecified: Secondary | ICD-10-CM | POA: Diagnosis not present

## 2019-05-12 DIAGNOSIS — G4700 Insomnia, unspecified: Secondary | ICD-10-CM | POA: Diagnosis not present

## 2019-05-12 NOTE — Telephone Encounter (Signed)
Transitional Care call Transitional Questions Answered by [name].   Patient name: Patricia Stanley  DOB: 1946-04-02 1. Are you/is patient experiencing any problems since coming home? No a. Are there any questions regarding any aspect of care? No 2. Are there any questions regarding medications administration/dosing? No a. Are meds being taken as prescribed? Yes b. "Patient should review meds with caller to confirm" Medication List Reviewed 3. Have there been any falls? No 4. Has Home Health been to the house and/or have they contacted you? Yes, [facility] a. If not, have you tried to contact them? NA b. Can we help you contact them? NA 5. Are bowels and bladder emptying properly? Yes a. Are there any unexpected incontinence issues? No b. If applicable, is patient following bowel/bladder programs? NA 6. Any fevers, problems with breathing, unexpected pain? No 7. Are there any skin problems or new areas of breakdown? No 8. Has the patient/family member arranged specialty MD follow up (ie cardiology/neurology/renal/surgical/etc.)?  [name] was instructed to call Dr. Chestine Spore, Dr. Matthias Hughs and [facility] to schedule HFU appointments. He was given addresses and phone numbers. He verbalizes understanding.  a. Can we help arrange? No 9. Does the patient need any other services or support that we can help arrange? No 10. Are caregivers following through as expected in assisting the patient? Yes 11. Has the patient quit smoking, drinking alcohol, or using drugs as recommended? (                        )  Appointment date/time 05/20/2019  arrival time 10:20 for 10:40 appointment with Dr. Marijean Niemann. At [address] [facility] [address]

## 2019-05-16 DIAGNOSIS — Z48812 Encounter for surgical aftercare following surgery on the circulatory system: Secondary | ICD-10-CM | POA: Diagnosis not present

## 2019-05-16 DIAGNOSIS — G43909 Migraine, unspecified, not intractable, without status migrainosus: Secondary | ICD-10-CM | POA: Diagnosis not present

## 2019-05-16 DIAGNOSIS — I69354 Hemiplegia and hemiparesis following cerebral infarction affecting left non-dominant side: Secondary | ICD-10-CM | POA: Diagnosis not present

## 2019-05-16 DIAGNOSIS — G4700 Insomnia, unspecified: Secondary | ICD-10-CM | POA: Diagnosis not present

## 2019-05-16 DIAGNOSIS — F419 Anxiety disorder, unspecified: Secondary | ICD-10-CM | POA: Diagnosis not present

## 2019-05-16 DIAGNOSIS — F331 Major depressive disorder, recurrent, moderate: Secondary | ICD-10-CM | POA: Diagnosis not present

## 2019-05-16 DIAGNOSIS — N289 Disorder of kidney and ureter, unspecified: Secondary | ICD-10-CM | POA: Diagnosis not present

## 2019-05-16 DIAGNOSIS — I10 Essential (primary) hypertension: Secondary | ICD-10-CM | POA: Diagnosis not present

## 2019-05-16 DIAGNOSIS — M810 Age-related osteoporosis without current pathological fracture: Secondary | ICD-10-CM | POA: Diagnosis not present

## 2019-05-18 ENCOUNTER — Ambulatory Visit: Payer: Medicare HMO | Admitting: Neurology

## 2019-05-18 ENCOUNTER — Encounter: Payer: Self-pay | Admitting: Neurology

## 2019-05-18 ENCOUNTER — Other Ambulatory Visit: Payer: Self-pay

## 2019-05-18 VITALS — BP 146/73 | HR 67 | Temp 97.9°F | Ht 61.0 in | Wt 98.0 lb

## 2019-05-18 DIAGNOSIS — I6521 Occlusion and stenosis of right carotid artery: Secondary | ICD-10-CM

## 2019-05-18 DIAGNOSIS — Z9889 Other specified postprocedural states: Secondary | ICD-10-CM

## 2019-05-18 DIAGNOSIS — G8194 Hemiplegia, unspecified affecting left nondominant side: Secondary | ICD-10-CM

## 2019-05-18 NOTE — Progress Notes (Signed)
[facility] [address] [place]. [place] [address] [phone]       OFFICE CONSULT NOTE  Ms. Patricia Stanley Date of Birth:  [birth year] Medical Record Number:  [id number]   Referring MD: Lauraine Rinne, PA-C Reason for Referral: Stroke HPI: Patricia Stanley is a 74 year old Caucasian lady seen today for initial office consultation visit for stroke.  History is obtained from the patient, husband as well as review of electronic medical records and I personally reviewed imaging films in PACS.  She has past medical history of hypertension, depression, migraines, gastroesophageal reflux disease and unexplained weight loss who presented with 3-day history of left-sided weakness and dragging the left arm.  She presented beyond time window for TPA.  CT scan of the head was unremarkable.  MRI scan of the brain showed large right corona radiata infarct with multiple old remote age basal ganglia lacunar infarcts and changes of small vessel disease.  CT angiogram of the neck showed extensive plaque in the right ICA with 65 to 75% stenosis and multifocal moderate left common carotid artery in origin 50% stenosis.  There is moderate proximal right M1 and moderate to severe left M1 and left V4 stenosis as well.  Carotid ultrasound however showed only 40 to 59% stenosis.  2D echo showed normal ejection fraction LDL cholesterol is 87 mg percent and hemoglobin A1c was 5.8.  Patient was started on dual antiplatelet therapy and vascular surgery was consulted who felt that the right moderate carotid stenosis had unstable plaque and required carotid surgery and she underwent successful right carotid endarterectomy by Dr. Monica Martinez on 04/21/2019.  She states she has done well since then her left-sided weakness is improving.  She is currently getting home physical and occupational therapy.  She is able to walk with a walker and can walk short distances indoors without it.  She still has some imbalance  and left leg stiffness and weakness.  Left-sided strength is improving the left grip and intrinsic hand muscles are still weak.  She is tolerating Plavix well without bruising or bleeding blood pressures well controlled today it is 146/72.  She is also tolerating Lipitor without muscle aches and pains.  ROS:   14 system review of systems is positive for weakness, incoordination, gait difficulty, imbalance and all other systems negative PMH:  Past Medical History:  Diagnosis Date  . Closed fracture of coccyx ([place]) 2015   AFTER FALL  . Depression   . GERD (gastroesophageal reflux disease)   . Headache    MIGRAINES OCCASIONAL  . History of kidney stones    HAS NOW  . Hypertension     Social History:  Social History   Socioeconomic History  . Marital status: Married    Spouse name: Not on file  . Number of children: Not on file  . Years of education: Not on file  . Highest education level: Not on file  Occupational History  . Not on file  Tobacco Use  . Smoking status: Former Smoker    Quit date: 05/06/1995    Years since quitting: 24.0  . Smokeless tobacco: Never Used  Substance and Sexual Activity  . Alcohol use: No  . Drug use: No  . Sexual activity: Not on file  Other Topics Concern  . Not on file  Social History Narrative  . Not on file   Social Determinants of Health   Financial Resource Strain:   . Difficulty of Paying Living Expenses: Not on file  Food  Insecurity:   . Worried About Programme researcher, broadcasting/film/video in the Last Year: Not on file  . Ran Out of Food in the Last Year: Not on file  Transportation Needs:   . Lack of Transportation (Medical): Not on file  . Lack of Transportation (Non-Medical): Not on file  Physical Activity:   . Days of Exercise per Week: Not on file  . Minutes of Exercise per Session: Not on file  Stress:   . Feeling of Stress : Not on file  Social Connections:   . Frequency of Communication with Friends and Family: Not on file  . Frequency  of Social Gatherings with Friends and Family: Not on file  . Attends Religious Services: Not on file  . Active Member of Clubs or Organizations: Not on file  . Attends Banker Meetings: Not on file  . Marital Status: Not on file  Intimate Partner Violence:   . Fear of Current or Ex-Partner: Not on file  . Emotionally Abused: Not on file  . Physically Abused: Not on file  . Sexually Abused: Not on file    Medications:   Current Outpatient Medications on File Prior to Visit  Medication Sig Dispense Refill  . acetaminophen (TYLENOL) 325 MG tablet Take 2 tablets (650 mg total) by mouth every 4 (four) hours as needed for mild pain (or temp > 37.5 C (99.5 F)). 30 tablet 0  . aspirin 325 MG tablet Take 1 tablet (325 mg total) by mouth daily. 30 tablet 0  . atenolol (TENORMIN) 50 MG tablet Take 1 tablet (50 mg total) by mouth every evening. 30 tablet 0  . atorvastatin (LIPITOR) 80 MG tablet Take 1 tablet (80 mg total) by mouth daily at 6 PM. 30 tablet 0  . clonazePAM (KLONOPIN) 1 MG tablet 1 tab every a.m. and 2 tabs nightly 60 tablet 0  . hydrALAZINE (APRESOLINE) 25 MG tablet Take 1 tablet (25 mg total) by mouth every 8 (eight) hours. 90 tablet 0  . HYDROcodone-acetaminophen (NORCO/VICODIN) 5-325 MG tablet Take 1-2 tablets by mouth every 4 (four) hours as needed for moderate pain. 20 tablet 0  . lisinopril (ZESTRIL) 10 MG tablet Take 1 tablet (10 mg total) by mouth daily. 30 tablet 0  . methocarbamol (ROBAXIN) 500 MG tablet Take 1 tablet (500 mg total) by mouth daily. 20 tablet 0  . Multiple Vitamin (MULTIVITAMIN WITH MINERALS) TABS tablet Take 1 tablet by mouth daily.    . pantoprazole (PROTONIX) 40 MG tablet Take 1 tablet (40 mg total) by mouth 2 (two) times daily before a meal. 60 tablet 0  . traZODone (DESYREL) 50 MG tablet Take 1 tablet (50 mg total) by mouth at bedtime. 30 tablet 0   No current facility-administered medications on file prior to visit.    Allergies:     Allergies  Allergen Reactions  . Paroxetine Other (See Comments)    Shakes-tremors    Physical Exam General: Frail elderly Caucasian lady, seated, in no evident distress Head: head normocephalic and atraumatic.   Neck: supple with no carotid or supraclavicular bruits.  Healed scar from recent right carotid endarterectomy on the right Cardiovascular: regular rate and rhythm, no murmurs Musculoskeletal: no deformity except mild left foot drop Skin:  no rash/petichiae Vascular:  Normal pulses all extremities  Neurologic Exam Mental Status: Awake and fully alert. Oriented to place and time. Recent and remote memory intact. Attention span, concentration and fund of knowledge appropriate. Mood and affect appropriate.  Cranial Nerves:  Fundoscopic exam reveals sharp disc margins. Pupils equal, briskly reactive to light. Extraocular movements full without nystagmus. Visual fields full to confrontation. Hearing intact. Facial sensation intact. Face, tongue, palate moves normally and symmetrically.  Motor: Mild left hemiparesis with 4/5 strength with weakness of left grip, intrinsic hand muscles and ankle dorsiflexors on the left.  Tone is increased slightly in the left leg. Sensory.: intact to touch , pinprick , position and vibratory sensation.  Coordination: Mildly impaired left finger-to-nose and knee to heel coordination. Gait and Station: Arises from chair without difficulty. Stance is normal. Gait demonstrates normal stride length with slight dragging of the left foot with mild left foot drop..  Unable to heel, toe and tandem walk .  Reflexes: 1+ and symmetric. Toes downgoing.   NIHSS  2 Modified Rankin 2  ASSESSMENT: 74 year old Caucasian lady with right subcortical infarct in December 2020 likely from symptomatic moderate right carotid stenosis who underwent elective right carotid endarterectomy on 04/21/2019 and is doing well but still has some mild residual left hemiparesis.  Vascular  risk factors of hypertension, hyperlipidemia and carotid disease     PLAN: I had a long d/w patient about his recent stroke, risk for recurrent stroke/TIAs, personally independently reviewed imaging studies and stroke evaluation results and answered questions.Continue aspirin 325 mg daily  for secondary stroke prevention and maintain strict control of hypertension with blood pressure goal below 130/90, diabetes with hemoglobin A1c goal below 6.5% and lipids with LDL cholesterol goal below 70 mg/dL. I also advised the patient to eat a healthy diet with plenty of whole grains, cereals, fruits and vegetables, exercise regularly and maintain ideal body weight .I also advised the patient to use a walker at all times for ambulation and we discussed fall and safety precautions.  She will continue home physical occupational therapy.  She will follow up with Dr. Chestine Spore for carotid surveillance.  Greater than 50% time during this 45-minute consultation visit was spent on counseling and coordination of care about her recent stroke, carotid stenosis and surgery and discussion about stroke prevention and answering questions followup in the future with my nurse practitioner Shanda Bumps in 6 months or call earlier if necessary. Delia Heady, MD  [facility] [address] [place], [place] [address]  Phone [phone] Fax [phone] Note: This document was prepared with digital dictation and possible smart phrase technology. Any transcriptional errors that result from this process are unintentional.

## 2019-05-18 NOTE — Patient Instructions (Signed)
I had a long d/w patient about his recent stroke, risk for recurrent stroke/TIAs, personally independently reviewed imaging studies and stroke evaluation results and answered questions.Continue aspirin 325 mg daily  for secondary stroke prevention and maintain strict control of hypertension with blood pressure goal below 130/90, diabetes with hemoglobin A1c goal below 6.5% and lipids with LDL cholesterol goal below 70 mg/dL. I also advised the patient to eat a healthy diet with plenty of whole grains, cereals, fruits and vegetables, exercise regularly and maintain ideal body weight .I also advised the patient to use a walker at all times for ambulation and we discussed fall and safety precautions.  She will continue home physical occupational therapy.  She will follow up with Dr. Carlis Abbott for carotid surveillance.  Followup in the future with my nurse practitioner Janett Billow in 6 months or call earlier if necessary.  Fall Prevention in the Home, Adult Falls can cause injuries. They can happen to people of all ages. There are many things you can do to make your home safe and to help prevent falls. Ask for help when making these changes, if needed. What actions can I take to prevent falls? General Instructions  Use good lighting in all rooms. Replace any light bulbs that burn out.  Turn on the lights when you go into a dark area. Use night-lights.  Keep items that you use often in easy-to-reach places. Lower the shelves around your home if necessary.  Set up your furniture so you have a clear path. Avoid moving your furniture around.  Do not have throw rugs and other things on the floor that can make you trip.  Avoid walking on wet floors.  If any of your floors are uneven, fix them.  Add color or contrast paint or tape to clearly mark and help you see: ? Any grab bars or handrails. ? First and last steps of stairways. ? Where the edge of each step is.  If you use a stepladder: ? Make sure that it  is fully opened. Do not climb a closed stepladder. ? Make sure that both sides of the stepladder are locked into place. ? Ask someone to hold the stepladder for you while you use it.  If there are any pets around you, be aware of where they are. What can I do in the bathroom?      Keep the floor dry. Clean up any water that spills onto the floor as soon as it happens.  Remove soap buildup in the tub or shower regularly.  Use non-skid mats or decals on the floor of the tub or shower.  Attach bath mats securely with double-sided, non-slip rug tape.  If you need to sit down in the shower, use a plastic, non-slip stool.  Install grab bars by the toilet and in the tub and shower. Do not use towel bars as grab bars. What can I do in the bedroom?  Make sure that you have a light by your bed that is easy to reach.  Do not use any sheets or blankets that are too big for your bed. They should not hang down onto the floor.  Have a firm chair that has side arms. You can use this for support while you get dressed. What can I do in the kitchen?  Clean up any spills right away.  If you need to reach something above you, use a strong step stool that has a grab bar.  Keep electrical cords out of the way.  Do not use floor polish or wax that makes floors slippery. If you must use wax, use non-skid floor wax. What can I do with my stairs?  Do not leave any items on the stairs.  Make sure that you have a light switch at the top of the stairs and the bottom of the stairs. If you do not have them, ask someone to add them for you.  Make sure that there are handrails on both sides of the stairs, and use them. Fix handrails that are broken or loose. Make sure that handrails are as long as the stairways.  Install non-slip stair treads on all stairs in your home.  Avoid having throw rugs at the top or bottom of the stairs. If you do have throw rugs, attach them to the floor with carpet  tape.  Choose a carpet that does not hide the edge of the steps on the stairway.  Check any carpeting to make sure that it is firmly attached to the stairs. Fix any carpet that is loose or worn. What can I do on the outside of my home?  Use bright outdoor lighting.  Regularly fix the edges of walkways and driveways and fix any cracks.  Remove anything that might make you trip as you walk through a door, such as a raised step or threshold.  Trim any bushes or trees on the path to your home.  Regularly check to see if handrails are loose or broken. Make sure that both sides of any steps have handrails.  Install guardrails along the edges of any raised decks and porches.  Clear walking paths of anything that might make someone trip, such as tools or rocks.  Have any leaves, snow, or ice cleared regularly.  Use sand or salt on walking paths during winter.  Clean up any spills in your garage right away. This includes grease or oil spills. What other actions can I take?  Wear shoes that: ? Have a low heel. Do not wear high heels. ? Have rubber bottoms. ? Are comfortable and fit you well. ? Are closed at the toe. Do not wear open-toe sandals.  Use tools that help you move around (mobility aids) if they are needed. These include: ? Canes. ? Walkers. ? Scooters. ? Crutches.  Review your medicines with your doctor. Some medicines can make you feel dizzy. This can increase your chance of falling. Ask your doctor what other things you can do to help prevent falls. Where to find more information  Centers for Disease Control and Prevention, STEADI: HealthcareCounselor.com.pt  [employer] on Aging: RingConnections.si Contact a doctor if:  You are afraid of falling at home.  You feel weak, drowsy, or dizzy at home.  You fall at home. Summary  There are many simple things that you can do to make your home safe and to help prevent falls.  Ways to make your home safe  include removing tripping hazards and installing grab bars in the bathroom.  Ask for help when making these changes in your home. This information is not intended to replace advice given to you by your health care provider. Make sure you discuss any questions you have with your health care provider. Document Revised: 08/12/2018 Document Reviewed: 12/04/2016 Elsevier Patient Education  2020 [employer].  Stroke Prevention Some medical conditions and behaviors are associated with a higher chance of having a stroke. You can help prevent a stroke by making nutrition, lifestyle, and other changes, including managing any medical  conditions you may have. What nutrition changes can be made?   Eat healthy foods. You can do this by: ? Choosing foods high in fiber, such as fresh fruits and vegetables and whole grains. ? Eating at least 5 or more servings of fruits and vegetables a day. Try to fill half of your plate at each meal with fruits and vegetables. ? Choosing lean protein foods, such as lean cuts of meat, poultry without skin, fish, tofu, beans, and nuts. ? Eating low-fat dairy products. ? Avoiding foods that are high in salt (sodium). This can help lower blood pressure. ? Avoiding foods that have saturated fat, trans fat, and cholesterol. This can help prevent high cholesterol. ? Avoiding processed and premade foods.  Follow your health care provider's specific guidelines for losing weight, controlling high blood pressure (hypertension), lowering high cholesterol, and managing diabetes. These may include: ? Reducing your daily calorie intake. ? Limiting your daily sodium intake to 1,500 milligrams (mg). ? Using only healthy fats for cooking, such as olive oil, canola oil, or sunflower oil. ? Counting your daily carbohydrate intake. What lifestyle changes can be made?  Maintain a healthy weight. Talk to your health care provider about your ideal weight.  Get at least 30 minutes of  moderate physical activity at least 5 days a week. Moderate activity includes brisk walking, biking, and swimming.  Do not use any products that contain nicotine or tobacco, such as cigarettes and e-cigarettes. If you need help quitting, ask your health care provider. It may also be helpful to avoid exposure to secondhand smoke.  Limit alcohol intake to no more than 1 drink a day for nonpregnant women and 2 drinks a day for men. One drink equals 12 oz of beer, 5 oz of wine, or 1 oz of hard liquor.  Stop any illegal drug use.  Avoid taking birth control pills. Talk to your health care provider about the risks of taking birth control pills if: ? You are over 37 years old. ? You smoke. ? You get migraines. ? You have ever had a blood clot. What other changes can be made?  Manage your cholesterol levels. ? Eating a healthy diet is important for preventing high cholesterol. If cholesterol cannot be managed through diet alone, you may also need to take medicines. ? Take any prescribed medicines to control your cholesterol as told by your health care provider.  Manage your diabetes. ? Eating a healthy diet and exercising regularly are important parts of managing your blood sugar. If your blood sugar cannot be managed through diet and exercise, you may need to take medicines. ? Take any prescribed medicines to control your diabetes as told by your health care provider.  Control your hypertension. ? To reduce your risk of stroke, try to keep your blood pressure below 130/80. ? Eating a healthy diet and exercising regularly are an important part of controlling your blood pressure. If your blood pressure cannot be managed through diet and exercise, you may need to take medicines. ? Take any prescribed medicines to control hypertension as told by your health care provider. ? Ask your health care provider if you should monitor your blood pressure at home. ? Have your blood pressure checked every  year, even if your blood pressure is normal. Blood pressure increases with age and some medical conditions.  Get evaluated for sleep disorders (sleep apnea). Talk to your health care provider about getting a sleep evaluation if you snore a lot or have excessive  sleepiness.  Take over-the-counter and prescription medicines only as told by your health care provider. Aspirin or blood thinners (antiplatelets or anticoagulants) may be recommended to reduce your risk of forming blood clots that can lead to stroke.  Make sure that any other medical conditions you have, such as atrial fibrillation or atherosclerosis, are managed. What are the warning signs of a stroke? The warning signs of a stroke can be easily remembered as BEFAST.  B is for balance. Signs include: ? Dizziness. ? Loss of balance or coordination. ? Sudden trouble walking.  E is for eyes. Signs include: ? A sudden change in vision. ? Trouble seeing.  F is for face. Signs include: ? Sudden weakness or numbness of the face. ? The face or eyelid drooping to one side.  A is for arms. Signs include: ? Sudden weakness or numbness of the arm, usually on one side of the body.  S is for speech. Signs include: ? Trouble speaking (aphasia). ? Trouble understanding.  T is for time. ? These symptoms may represent a serious problem that is an emergency. Do not wait to see if the symptoms will go away. Get medical help right away. Call your local emergency services (911 in the [place] Do not drive yourself to the hospital.  Other signs of stroke may include: ? A sudden, severe headache with no known cause. ? Nausea or vomiting. ? Seizure. Where to find more information For more information, visit:  American Stroke Association: www.strokeassociation.org  National Stroke Association: www.stroke.org Summary  You can prevent a stroke by eating healthy, exercising, not smoking, limiting alcohol intake, and managing any medical  conditions you may have.  Do not use any products that contain nicotine or tobacco, such as cigarettes and e-cigarettes. If you need help quitting, ask your health care provider. It may also be helpful to avoid exposure to secondhand smoke.  Remember BEFAST for warning signs of stroke. Get help right away if you or a loved one has any of these signs. This information is not intended to replace advice given to you by your health care provider. Make sure you discuss any questions you have with your health care provider. Document Revised: 04/03/2017 Document Reviewed: 05/27/2016 Elsevier Patient Education  2020 [employer].

## 2019-05-19 ENCOUNTER — Telehealth: Payer: Self-pay

## 2019-05-19 DIAGNOSIS — F419 Anxiety disorder, unspecified: Secondary | ICD-10-CM | POA: Diagnosis not present

## 2019-05-19 DIAGNOSIS — M810 Age-related osteoporosis without current pathological fracture: Secondary | ICD-10-CM | POA: Diagnosis not present

## 2019-05-19 DIAGNOSIS — G43909 Migraine, unspecified, not intractable, without status migrainosus: Secondary | ICD-10-CM | POA: Diagnosis not present

## 2019-05-19 DIAGNOSIS — G4700 Insomnia, unspecified: Secondary | ICD-10-CM | POA: Diagnosis not present

## 2019-05-19 DIAGNOSIS — I10 Essential (primary) hypertension: Secondary | ICD-10-CM | POA: Diagnosis not present

## 2019-05-19 DIAGNOSIS — I69354 Hemiplegia and hemiparesis following cerebral infarction affecting left non-dominant side: Secondary | ICD-10-CM | POA: Diagnosis not present

## 2019-05-19 DIAGNOSIS — F331 Major depressive disorder, recurrent, moderate: Secondary | ICD-10-CM | POA: Diagnosis not present

## 2019-05-19 DIAGNOSIS — Z48812 Encounter for surgical aftercare following surgery on the circulatory system: Secondary | ICD-10-CM | POA: Diagnosis not present

## 2019-05-19 DIAGNOSIS — N289 Disorder of kidney and ureter, unspecified: Secondary | ICD-10-CM | POA: Diagnosis not present

## 2019-05-19 NOTE — Telephone Encounter (Signed)
Mallory, OT from [facility] called requesting verbal orders for HHOT 1wk1, 2wk1, 1wk4. Orders approved and given.

## 2019-05-20 ENCOUNTER — Other Ambulatory Visit: Payer: Self-pay

## 2019-05-20 ENCOUNTER — Encounter: Payer: Medicare HMO | Attending: Physical Medicine and Rehabilitation | Admitting: Physical Medicine and Rehabilitation

## 2019-05-20 ENCOUNTER — Encounter: Payer: Self-pay | Admitting: Physical Medicine and Rehabilitation

## 2019-05-20 VITALS — BP 166/64 | HR 71 | Temp 97.8°F | Ht 61.0 in | Wt 100.0 lb

## 2019-05-20 DIAGNOSIS — K5900 Constipation, unspecified: Secondary | ICD-10-CM | POA: Insufficient documentation

## 2019-05-20 DIAGNOSIS — F419 Anxiety disorder, unspecified: Secondary | ICD-10-CM | POA: Diagnosis not present

## 2019-05-20 DIAGNOSIS — G43909 Migraine, unspecified, not intractable, without status migrainosus: Secondary | ICD-10-CM | POA: Diagnosis not present

## 2019-05-20 DIAGNOSIS — I10 Essential (primary) hypertension: Secondary | ICD-10-CM | POA: Diagnosis not present

## 2019-05-20 DIAGNOSIS — I639 Cerebral infarction, unspecified: Secondary | ICD-10-CM | POA: Diagnosis not present

## 2019-05-20 DIAGNOSIS — Z48812 Encounter for surgical aftercare following surgery on the circulatory system: Secondary | ICD-10-CM | POA: Diagnosis not present

## 2019-05-20 DIAGNOSIS — N289 Disorder of kidney and ureter, unspecified: Secondary | ICD-10-CM | POA: Diagnosis not present

## 2019-05-20 DIAGNOSIS — R159 Full incontinence of feces: Secondary | ICD-10-CM

## 2019-05-20 DIAGNOSIS — Z789 Other specified health status: Secondary | ICD-10-CM | POA: Diagnosis not present

## 2019-05-20 DIAGNOSIS — I69354 Hemiplegia and hemiparesis following cerebral infarction affecting left non-dominant side: Secondary | ICD-10-CM | POA: Diagnosis not present

## 2019-05-20 DIAGNOSIS — M810 Age-related osteoporosis without current pathological fracture: Secondary | ICD-10-CM | POA: Diagnosis not present

## 2019-05-20 DIAGNOSIS — G4700 Insomnia, unspecified: Secondary | ICD-10-CM | POA: Diagnosis not present

## 2019-05-20 DIAGNOSIS — Z7409 Other reduced mobility: Secondary | ICD-10-CM | POA: Insufficient documentation

## 2019-05-20 DIAGNOSIS — F331 Major depressive disorder, recurrent, moderate: Secondary | ICD-10-CM | POA: Diagnosis not present

## 2019-05-20 MED ORDER — DOCUSATE SODIUM 100 MG PO CAPS: 100.0000 mg | ORAL_CAPSULE | Freq: Every day | ORAL | 1 refills | Status: DC

## 2019-05-20 NOTE — Progress Notes (Signed)
Subjective:    Patient ID: Patricia Stanley, female    DOB: 1945-09-05, 74 y.o.   MRN: [id number]  HPI  Patricia Stanley is a 74 year old woman who presents for transitional care follow-up following small vessel CVA.   She has been doing very well at home, receiving both home PT and OT. She is able to walk within her home and down and up her 6 stairs at the back of her house without falls. Her husband helps her with ADLs such as lower body dressing but she is able to put her shirt on herself. She is in great spirits as always. Her goal is to be able to walk independently within her home and by summer be able to walk outside to get her mail.  She has been having a bowel movement every 2-3 days and always struggles with constipation. She has had some bowel incontinence with loose watery stool. She is now wearing pampers. These accidents usually occurred at night.   She has all medications and does not require refills. She is taking all medications as prescribed upon discharge, except for the hydrocodone, which she hasn't needed. She has had mild headache 3/10, intermittent, for which she takes no pain medication.   Her BP is high this visit, but is usually within normal limits at home when her PT has checked her. It was also normal yesterday when she went for her urology appointment.  Denies urinary incontinence or burning. Has been sleeping well at night.  Pain Inventory Average Pain 3 Pain Right Now 0 My pain is na  In the last 24 hours, has pain interfered with the following? General activity 1 Relation with others 0 Enjoyment of life 0 What TIME of day is your pain at its worst? na Sleep (in general) Good  Pain is worse with: na Pain improves with: na Relief from Meds: 9  Mobility use a walker ability to climb steps?  yes do you drive?  no  Function retired I need assistance with the following:  bathing and meal prep  Neuro/Psych bowel control problems weakness  Prior  Studies Any changes since last visit?  no  Physicians involved in your care Any changes since last visit?  no   No family history on file. Social History   Socioeconomic History  . Marital status: Married    Spouse name: Not on file  . Number of children: Not on file  . Years of education: Not on file  . Highest education level: Not on file  Occupational History  . Not on file  Tobacco Use  . Smoking status: Former Smoker    Quit date: 05/06/1995    Years since quitting: 24.0  . Smokeless tobacco: Never Used  Substance and Sexual Activity  . Alcohol use: No  . Drug use: No  . Sexual activity: Not on file  Other Topics Concern  . Not on file  Social History Narrative  . Not on file   Social Determinants of Health   Financial Resource Strain:   . Difficulty of Paying Living Expenses: Not on file  Food Insecurity:   . Worried About Charity fundraiser in the Last Year: Not on file  . Ran Out of Food in the Last Year: Not on file  Transportation Needs:   . Lack of Transportation (Medical): Not on file  . Lack of Transportation (Non-Medical): Not on file  Physical Activity:   . Days of Exercise per Week: Not on file  .  Minutes of Exercise per Session: Not on file  Stress:   . Feeling of Stress : Not on file  Social Connections:   . Frequency of Communication with Friends and Family: Not on file  . Frequency of Social Gatherings with Friends and Family: Not on file  . Attends Religious Services: Not on file  . Active Member of Clubs or Organizations: Not on file  . Attends Banker Meetings: Not on file  . Marital Status: Not on file   Past Surgical History:  Procedure Laterality Date  . ABDOMINAL HYSTERECTOMY     1 OVARY LEFT  . APPENDECTOMY  AGE 57   DONE WITH OVARY REMOVAL  . COLONOSCOPY WITH PROPOFOL N/A 12/03/2015   Procedure: COLONOSCOPY WITH PROPOFOL;  Surgeon: Charolett Bumpers, MD;  Location: [facility];  Service: Endoscopy;  Laterality: N/A;   . ENDARTERECTOMY Right 04/21/2019   Procedure: ENDARTERECTOMY CAROTID;  Surgeon: Cephus Shelling, MD;  Location: [facility] OR;  Service: Vascular;  Laterality: Right;  . ESOPHAGOGASTRODUODENOSCOPY (EGD) WITH PROPOFOL N/A 12/03/2015   Procedure: ESOPHAGOGASTRODUODENOSCOPY (EGD) WITH PROPOFOL;  Surgeon: Charolett Bumpers, MD;  Location: [facility];  Service: Endoscopy;  Laterality: N/A;  . TONSILLECTOMY  AGE 36   AND ADENOIDS REMOVED   Past Medical History:  Diagnosis Date  . Closed fracture of coccyx (HCC) 2015   AFTER FALL  . Depression   . GERD (gastroesophageal reflux disease)   . Headache    MIGRAINES OCCASIONAL  . History of kidney stones    HAS NOW  . Hypertension    There were no vitals taken for this visit.  Opioid Risk Score:   Fall Risk Score:  `1  Depression screen PHQ 2/9  No flowsheet data found.   Review of Systems  Constitutional: Negative.   HENT: Negative.   Eyes: Negative.   Respiratory: Negative.   Cardiovascular: Negative.   Gastrointestinal: Negative.   Endocrine: Negative.   Genitourinary: Negative.   Musculoskeletal: Positive for gait problem.  Skin: Negative.   Allergic/Immunologic: Negative.   Neurological: Positive for weakness.  Hematological: Negative.   Psychiatric/Behavioral: Negative.   All other systems reviewed and are negative.      Objective:   Physical Exam Gen: no distress, normal appearing HEENT: oral mucosa pink and moist, NCAT Cardio: Reg rate Chest: normal effort, normal rate of breathing Abd: soft, non-distended Ext: no edema Skin: intact Neuro: AOx3.  Musculoskeletal: 4/5 strength throughout. Ambulates with slow gait with RW; able to navigate turns well.  Psych: pleasant, normal affect      Assessment & Plan:  Patricia Stanley is a 74 year old woman who presents for transitional care follow-up following small vessel CVA.   1) Impaired mobility and ADLs --Progressing well. --Continue home PT and OT with eventual  transition to outpatient therapies.  --Goal is for patient to improve independence in ADLs and safety in home ambulation with outdoor ambulation this summer.   2) Constipation 3) Bowel incontinence --Has history of constipation --Currently with BM every 2-3 days, small and hard. Has had leaking of stool at night and is wearing pampers. Likely stool is leaking around a hard obstruction and she needs stool softener. Will start docusate daily, and advised that she can increase to BID if needed.   4) Polypharmacy --Reviewed all medications and d/ced Tylenol and Hydrocodone which she is not needing. Continue other medications as prescribed.  5) HTN --Continue Lisinopril and Hydralazine. BP high in office today but normal at home. Educated regarding proper  parameters and advised to call office if BP is exceeding these parameters.   Forty-five minutes of face to face patient care time were spent during this visit. All questions were encouraged and answered. Follow up with me in 6 months.

## 2019-05-23 ENCOUNTER — Telehealth (HOSPITAL_COMMUNITY): Payer: Self-pay

## 2019-05-23 DIAGNOSIS — I639 Cerebral infarction, unspecified: Secondary | ICD-10-CM | POA: Diagnosis not present

## 2019-05-23 NOTE — Progress Notes (Signed)
  POST OPERATIVE OFFICE NOTE    CC:  F/u for surgery  HPI:  This is a 74 y.o. female who is s/p right CEA on 04/21/2019 by Dr. Chestine Spore.  POD 1, she was doing well.  She had LUE weakness 3/5 and LLE weakness of 4/5.  She was discharged to CIR.  She did have some noted unitnentional weight loss and work up per GI services was unremarkable.  She remained on Protonix and her appetite continued to improve.    She states that she went home from CIR on 1/5.  She has had continued improvement in her LUE and LLE weakness.  She states that her speech is better and she talks slow to make sure she gets it right.  She has not had any new symptoms since being home.    She is on asa/statin.  Allergies  Allergen Reactions  . Paroxetine Other (See Comments)    Shakes-tremors    Current Outpatient Medications  Medication Sig Dispense Refill  . aspirin 325 MG tablet Take 1 tablet (325 mg total) by mouth daily. 30 tablet 0  . atenolol (TENORMIN) 50 MG tablet Take 1 tablet (50 mg total) by mouth every evening. 30 tablet 0  . atorvastatin (LIPITOR) 80 MG tablet Take 1 tablet (80 mg total) by mouth daily at 6 PM. 30 tablet 0  . clonazePAM (KLONOPIN) 1 MG tablet 1 tab every a.m. and 2 tabs nightly 60 tablet 0  . docusate sodium (COLACE) 100 MG capsule Take 1 capsule (100 mg total) by mouth daily. 30 capsule 1  . hydrALAZINE (APRESOLINE) 25 MG tablet Take 1 tablet (25 mg total) by mouth every 8 (eight) hours. 90 tablet 0  . lisinopril (ZESTRIL) 10 MG tablet Take 1 tablet (10 mg total) by mouth daily. 30 tablet 0  . methocarbamol (ROBAXIN) 500 MG tablet Take 1 tablet (500 mg total) by mouth daily. 20 tablet 0  . Multiple Vitamin (MULTIVITAMIN WITH MINERALS) TABS tablet Take 1 tablet by mouth daily.    . pantoprazole (PROTONIX) 40 MG tablet Take 1 tablet (40 mg total) by mouth 2 (two) times daily before a meal. 60 tablet 0  . traZODone (DESYREL) 50 MG tablet Take 1 tablet (50 mg total) by mouth at bedtime. 30  tablet 0   No current facility-administered medications for this visit.     ROS:  See HPI  Physical Exam:  Today's Vitals   05/24/19 1119 05/24/19 1121  BP: 140/69 (!) 145/67  Pulse: 64   Resp: 16   Temp: (!) 97 F (36.1 C)   TempSrc: Temporal   SpO2: 99%   Weight: 96 lb 12.8 oz (43.9 kg)   Height: 5\' 1"  (1.549 m)    Body mass index is 18.29 kg/m.   Incision:  Healed nicely Neuro:  LUE 4/5 LLE 4/5; smile is symmetric; tongue is midline.  Speech is slow but clear.     Assessment/Plan:  This is a 74 y.o. female who is s/p: Right CEA on 04/21/2019 by Dr. 04/23/2019  -pt doing well and continues to improve on her left sided weakness and speech. -she will f/u in 9 months with a carotid duplex.  She will call sooner if she has any issues.  -continue statin/asa   Chestine Spore, PA-C [facility] [phone]  Clinic MD:  Pt seen with Dr. [phone]

## 2019-05-23 NOTE — Telephone Encounter (Signed)

## 2019-05-24 ENCOUNTER — Other Ambulatory Visit: Payer: Self-pay

## 2019-05-24 ENCOUNTER — Ambulatory Visit (INDEPENDENT_AMBULATORY_CARE_PROVIDER_SITE_OTHER): Payer: Self-pay | Admitting: Physician Assistant

## 2019-05-24 VITALS — BP 145/67 | HR 64 | Temp 97.0°F | Resp 16 | Ht 61.0 in | Wt 96.8 lb

## 2019-05-24 DIAGNOSIS — Z9889 Other specified postprocedural states: Secondary | ICD-10-CM

## 2019-05-26 ENCOUNTER — Other Ambulatory Visit: Payer: Self-pay | Admitting: *Deleted

## 2019-05-26 DIAGNOSIS — Z9889 Other specified postprocedural states: Secondary | ICD-10-CM

## 2019-05-27 DIAGNOSIS — G4700 Insomnia, unspecified: Secondary | ICD-10-CM | POA: Diagnosis not present

## 2019-05-27 DIAGNOSIS — G43909 Migraine, unspecified, not intractable, without status migrainosus: Secondary | ICD-10-CM | POA: Diagnosis not present

## 2019-05-27 DIAGNOSIS — I69354 Hemiplegia and hemiparesis following cerebral infarction affecting left non-dominant side: Secondary | ICD-10-CM | POA: Diagnosis not present

## 2019-05-27 DIAGNOSIS — I10 Essential (primary) hypertension: Secondary | ICD-10-CM | POA: Diagnosis not present

## 2019-05-27 DIAGNOSIS — F331 Major depressive disorder, recurrent, moderate: Secondary | ICD-10-CM | POA: Diagnosis not present

## 2019-05-27 DIAGNOSIS — M810 Age-related osteoporosis without current pathological fracture: Secondary | ICD-10-CM | POA: Diagnosis not present

## 2019-05-27 DIAGNOSIS — N289 Disorder of kidney and ureter, unspecified: Secondary | ICD-10-CM | POA: Diagnosis not present

## 2019-05-27 DIAGNOSIS — F419 Anxiety disorder, unspecified: Secondary | ICD-10-CM | POA: Diagnosis not present

## 2019-05-27 DIAGNOSIS — Z48812 Encounter for surgical aftercare following surgery on the circulatory system: Secondary | ICD-10-CM | POA: Diagnosis not present

## 2019-05-31 DIAGNOSIS — M810 Age-related osteoporosis without current pathological fracture: Secondary | ICD-10-CM | POA: Diagnosis not present

## 2019-05-31 DIAGNOSIS — F331 Major depressive disorder, recurrent, moderate: Secondary | ICD-10-CM | POA: Diagnosis not present

## 2019-05-31 DIAGNOSIS — F419 Anxiety disorder, unspecified: Secondary | ICD-10-CM | POA: Diagnosis not present

## 2019-05-31 DIAGNOSIS — N289 Disorder of kidney and ureter, unspecified: Secondary | ICD-10-CM | POA: Diagnosis not present

## 2019-05-31 DIAGNOSIS — I10 Essential (primary) hypertension: Secondary | ICD-10-CM | POA: Diagnosis not present

## 2019-05-31 DIAGNOSIS — I69354 Hemiplegia and hemiparesis following cerebral infarction affecting left non-dominant side: Secondary | ICD-10-CM | POA: Diagnosis not present

## 2019-05-31 DIAGNOSIS — G43909 Migraine, unspecified, not intractable, without status migrainosus: Secondary | ICD-10-CM | POA: Diagnosis not present

## 2019-05-31 DIAGNOSIS — Z48812 Encounter for surgical aftercare following surgery on the circulatory system: Secondary | ICD-10-CM | POA: Diagnosis not present

## 2019-05-31 DIAGNOSIS — G4700 Insomnia, unspecified: Secondary | ICD-10-CM | POA: Diagnosis not present

## 2019-06-03 DIAGNOSIS — I69354 Hemiplegia and hemiparesis following cerebral infarction affecting left non-dominant side: Secondary | ICD-10-CM | POA: Diagnosis not present

## 2019-06-03 DIAGNOSIS — E785 Hyperlipidemia, unspecified: Secondary | ICD-10-CM

## 2019-06-03 DIAGNOSIS — Z7902 Long term (current) use of antithrombotics/antiplatelets: Secondary | ICD-10-CM

## 2019-06-03 DIAGNOSIS — Z9181 History of falling: Secondary | ICD-10-CM

## 2019-06-03 DIAGNOSIS — N289 Disorder of kidney and ureter, unspecified: Secondary | ICD-10-CM

## 2019-06-03 DIAGNOSIS — Z87891 Personal history of nicotine dependence: Secondary | ICD-10-CM

## 2019-06-03 DIAGNOSIS — M810 Age-related osteoporosis without current pathological fracture: Secondary | ICD-10-CM

## 2019-06-03 DIAGNOSIS — K219 Gastro-esophageal reflux disease without esophagitis: Secondary | ICD-10-CM

## 2019-06-03 DIAGNOSIS — Z48812 Encounter for surgical aftercare following surgery on the circulatory system: Secondary | ICD-10-CM | POA: Diagnosis not present

## 2019-06-03 DIAGNOSIS — Z8781 Personal history of (healed) traumatic fracture: Secondary | ICD-10-CM

## 2019-06-03 DIAGNOSIS — F331 Major depressive disorder, recurrent, moderate: Secondary | ICD-10-CM

## 2019-06-03 DIAGNOSIS — F419 Anxiety disorder, unspecified: Secondary | ICD-10-CM

## 2019-06-03 DIAGNOSIS — I10 Essential (primary) hypertension: Secondary | ICD-10-CM | POA: Diagnosis not present

## 2019-06-03 DIAGNOSIS — Z7982 Long term (current) use of aspirin: Secondary | ICD-10-CM

## 2019-06-03 DIAGNOSIS — G43909 Migraine, unspecified, not intractable, without status migrainosus: Secondary | ICD-10-CM | POA: Diagnosis not present

## 2019-06-03 DIAGNOSIS — Z87442 Personal history of urinary calculi: Secondary | ICD-10-CM

## 2019-06-03 DIAGNOSIS — G4700 Insomnia, unspecified: Secondary | ICD-10-CM

## 2019-06-03 DIAGNOSIS — R4701 Aphasia: Secondary | ICD-10-CM

## 2019-06-06 DIAGNOSIS — F331 Major depressive disorder, recurrent, moderate: Secondary | ICD-10-CM | POA: Diagnosis not present

## 2019-06-06 DIAGNOSIS — M810 Age-related osteoporosis without current pathological fracture: Secondary | ICD-10-CM | POA: Diagnosis not present

## 2019-06-06 DIAGNOSIS — G4700 Insomnia, unspecified: Secondary | ICD-10-CM | POA: Diagnosis not present

## 2019-06-06 DIAGNOSIS — F419 Anxiety disorder, unspecified: Secondary | ICD-10-CM | POA: Diagnosis not present

## 2019-06-06 DIAGNOSIS — N289 Disorder of kidney and ureter, unspecified: Secondary | ICD-10-CM | POA: Diagnosis not present

## 2019-06-06 DIAGNOSIS — I10 Essential (primary) hypertension: Secondary | ICD-10-CM | POA: Diagnosis not present

## 2019-06-06 DIAGNOSIS — Z48812 Encounter for surgical aftercare following surgery on the circulatory system: Secondary | ICD-10-CM | POA: Diagnosis not present

## 2019-06-06 DIAGNOSIS — I69354 Hemiplegia and hemiparesis following cerebral infarction affecting left non-dominant side: Secondary | ICD-10-CM | POA: Diagnosis not present

## 2019-06-06 DIAGNOSIS — G43909 Migraine, unspecified, not intractable, without status migrainosus: Secondary | ICD-10-CM | POA: Diagnosis not present

## 2019-06-11 DIAGNOSIS — G43909 Migraine, unspecified, not intractable, without status migrainosus: Secondary | ICD-10-CM | POA: Diagnosis not present

## 2019-06-11 DIAGNOSIS — G4700 Insomnia, unspecified: Secondary | ICD-10-CM | POA: Diagnosis not present

## 2019-06-11 DIAGNOSIS — M810 Age-related osteoporosis without current pathological fracture: Secondary | ICD-10-CM | POA: Diagnosis not present

## 2019-06-11 DIAGNOSIS — F419 Anxiety disorder, unspecified: Secondary | ICD-10-CM | POA: Diagnosis not present

## 2019-06-11 DIAGNOSIS — I69354 Hemiplegia and hemiparesis following cerebral infarction affecting left non-dominant side: Secondary | ICD-10-CM | POA: Diagnosis not present

## 2019-06-11 DIAGNOSIS — F331 Major depressive disorder, recurrent, moderate: Secondary | ICD-10-CM | POA: Diagnosis not present

## 2019-06-11 DIAGNOSIS — N289 Disorder of kidney and ureter, unspecified: Secondary | ICD-10-CM | POA: Diagnosis not present

## 2019-06-11 DIAGNOSIS — Z48812 Encounter for surgical aftercare following surgery on the circulatory system: Secondary | ICD-10-CM | POA: Diagnosis not present

## 2019-06-11 DIAGNOSIS — I10 Essential (primary) hypertension: Secondary | ICD-10-CM | POA: Diagnosis not present

## 2019-06-15 DIAGNOSIS — G43909 Migraine, unspecified, not intractable, without status migrainosus: Secondary | ICD-10-CM | POA: Diagnosis not present

## 2019-06-15 DIAGNOSIS — I10 Essential (primary) hypertension: Secondary | ICD-10-CM | POA: Diagnosis not present

## 2019-06-15 DIAGNOSIS — F419 Anxiety disorder, unspecified: Secondary | ICD-10-CM | POA: Diagnosis not present

## 2019-06-15 DIAGNOSIS — F331 Major depressive disorder, recurrent, moderate: Secondary | ICD-10-CM | POA: Diagnosis not present

## 2019-06-15 DIAGNOSIS — I69354 Hemiplegia and hemiparesis following cerebral infarction affecting left non-dominant side: Secondary | ICD-10-CM | POA: Diagnosis not present

## 2019-06-15 DIAGNOSIS — M810 Age-related osteoporosis without current pathological fracture: Secondary | ICD-10-CM | POA: Diagnosis not present

## 2019-06-15 DIAGNOSIS — G4700 Insomnia, unspecified: Secondary | ICD-10-CM | POA: Diagnosis not present

## 2019-06-15 DIAGNOSIS — N289 Disorder of kidney and ureter, unspecified: Secondary | ICD-10-CM | POA: Diagnosis not present

## 2019-06-15 DIAGNOSIS — Z48812 Encounter for surgical aftercare following surgery on the circulatory system: Secondary | ICD-10-CM | POA: Diagnosis not present

## 2019-06-16 DIAGNOSIS — G43909 Migraine, unspecified, not intractable, without status migrainosus: Secondary | ICD-10-CM | POA: Diagnosis not present

## 2019-06-16 DIAGNOSIS — N289 Disorder of kidney and ureter, unspecified: Secondary | ICD-10-CM | POA: Diagnosis not present

## 2019-06-16 DIAGNOSIS — I69354 Hemiplegia and hemiparesis following cerebral infarction affecting left non-dominant side: Secondary | ICD-10-CM | POA: Diagnosis not present

## 2019-06-16 DIAGNOSIS — Z48812 Encounter for surgical aftercare following surgery on the circulatory system: Secondary | ICD-10-CM | POA: Diagnosis not present

## 2019-06-16 DIAGNOSIS — M810 Age-related osteoporosis without current pathological fracture: Secondary | ICD-10-CM | POA: Diagnosis not present

## 2019-06-16 DIAGNOSIS — F331 Major depressive disorder, recurrent, moderate: Secondary | ICD-10-CM | POA: Diagnosis not present

## 2019-06-16 DIAGNOSIS — G4700 Insomnia, unspecified: Secondary | ICD-10-CM | POA: Diagnosis not present

## 2019-06-16 DIAGNOSIS — I10 Essential (primary) hypertension: Secondary | ICD-10-CM | POA: Diagnosis not present

## 2019-06-16 DIAGNOSIS — F419 Anxiety disorder, unspecified: Secondary | ICD-10-CM | POA: Diagnosis not present

## 2019-07-08 DIAGNOSIS — M810 Age-related osteoporosis without current pathological fracture: Secondary | ICD-10-CM | POA: Diagnosis not present

## 2019-07-08 DIAGNOSIS — I10 Essential (primary) hypertension: Secondary | ICD-10-CM | POA: Diagnosis not present

## 2019-07-08 DIAGNOSIS — N289 Disorder of kidney and ureter, unspecified: Secondary | ICD-10-CM | POA: Diagnosis not present

## 2019-07-08 DIAGNOSIS — Z48812 Encounter for surgical aftercare following surgery on the circulatory system: Secondary | ICD-10-CM | POA: Diagnosis not present

## 2019-07-08 DIAGNOSIS — G43909 Migraine, unspecified, not intractable, without status migrainosus: Secondary | ICD-10-CM | POA: Diagnosis not present

## 2019-07-08 DIAGNOSIS — F419 Anxiety disorder, unspecified: Secondary | ICD-10-CM | POA: Diagnosis not present

## 2019-07-08 DIAGNOSIS — I69354 Hemiplegia and hemiparesis following cerebral infarction affecting left non-dominant side: Secondary | ICD-10-CM | POA: Diagnosis not present

## 2019-07-08 DIAGNOSIS — G4700 Insomnia, unspecified: Secondary | ICD-10-CM | POA: Diagnosis not present

## 2019-07-08 DIAGNOSIS — F331 Major depressive disorder, recurrent, moderate: Secondary | ICD-10-CM | POA: Diagnosis not present

## 2019-07-21 DIAGNOSIS — F419 Anxiety disorder, unspecified: Secondary | ICD-10-CM | POA: Diagnosis not present

## 2019-07-21 DIAGNOSIS — I2510 Atherosclerotic heart disease of native coronary artery without angina pectoris: Secondary | ICD-10-CM | POA: Diagnosis not present

## 2019-07-21 DIAGNOSIS — I6930 Unspecified sequelae of cerebral infarction: Secondary | ICD-10-CM | POA: Diagnosis not present

## 2019-07-21 DIAGNOSIS — B001 Herpesviral vesicular dermatitis: Secondary | ICD-10-CM | POA: Diagnosis not present

## 2019-07-21 DIAGNOSIS — F324 Major depressive disorder, single episode, in partial remission: Secondary | ICD-10-CM | POA: Diagnosis not present

## 2019-07-21 DIAGNOSIS — G43919 Migraine, unspecified, intractable, without status migrainosus: Secondary | ICD-10-CM | POA: Diagnosis not present

## 2019-07-21 DIAGNOSIS — I10 Essential (primary) hypertension: Secondary | ICD-10-CM | POA: Diagnosis not present

## 2019-07-21 DIAGNOSIS — R634 Abnormal weight loss: Secondary | ICD-10-CM | POA: Diagnosis not present

## 2019-07-21 DIAGNOSIS — J309 Allergic rhinitis, unspecified: Secondary | ICD-10-CM | POA: Diagnosis not present

## 2019-08-28 ENCOUNTER — Emergency Department (HOSPITAL_COMMUNITY)
Admission: EM | Admit: 2019-08-28 | Discharge: 2019-08-29 | Disposition: A | Discharge status: Home / Self Care | Payer: Medicare HMO | Attending: Emergency Medicine | Admitting: Emergency Medicine

## 2019-08-28 ENCOUNTER — Other Ambulatory Visit: Payer: Self-pay

## 2019-08-28 DIAGNOSIS — R197 Diarrhea, unspecified: Secondary | ICD-10-CM | POA: Diagnosis not present

## 2019-08-28 DIAGNOSIS — I10 Essential (primary) hypertension: Secondary | ICD-10-CM | POA: Diagnosis not present

## 2019-08-28 DIAGNOSIS — E876 Hypokalemia: Secondary | ICD-10-CM | POA: Diagnosis not present

## 2019-08-28 DIAGNOSIS — R5383 Other fatigue: Secondary | ICD-10-CM | POA: Diagnosis not present

## 2019-08-28 DIAGNOSIS — Z79899 Other long term (current) drug therapy: Secondary | ICD-10-CM | POA: Insufficient documentation

## 2019-08-28 DIAGNOSIS — I69354 Hemiplegia and hemiparesis following cerebral infarction affecting left non-dominant side: Secondary | ICD-10-CM | POA: Diagnosis not present

## 2019-08-28 DIAGNOSIS — R531 Weakness: Secondary | ICD-10-CM | POA: Insufficient documentation

## 2019-08-28 LAB — CBC
HCT: 30.9 % — ABNORMAL LOW (ref 36.0–46.0)
Hemoglobin: 9.6 g/dL — ABNORMAL LOW (ref 12.0–15.0)
MCH: 28.6 pg (ref 26.0–34.0)
MCHC: 31.1 g/dL (ref 30.0–36.0)
MCV: 92 fL (ref 80.0–100.0)
Platelets: 249 10*3/uL (ref 150–400)
RBC: 3.36 MIL/uL — ABNORMAL LOW (ref 3.87–5.11)
RDW: 14.4 % (ref 11.5–15.5)
WBC: 9.4 10*3/uL (ref 4.0–10.5)
nRBC: 0 % (ref 0.0–0.2)

## 2019-08-28 LAB — COMPREHENSIVE METABOLIC PANEL
ALT: 12 U/L (ref 0–44)
AST: 22 U/L (ref 15–41)
Albumin: 2.3 g/dL — ABNORMAL LOW (ref 3.5–5.0)
Alkaline Phosphatase: 61 U/L (ref 38–126)
Anion gap: 11 (ref 5–15)
BUN: 10 mg/dL (ref 8–23)
CO2: 23 mmol/L (ref 22–32)
Calcium: 8.3 mg/dL — ABNORMAL LOW (ref 8.9–10.3)
Chloride: 105 mmol/L (ref 98–111)
Creatinine, Ser: 0.92 mg/dL (ref 0.44–1.00)
GFR calc Af Amer: >60 mL/min (ref >60)
GFR calc non Af Amer: >60 mL/min (ref >60)
Glucose, Bld: 101 mg/dL — ABNORMAL HIGH (ref 70–99)
Potassium: 3.3 mmol/L — ABNORMAL LOW (ref 3.5–5.1)
Sodium: 139 mmol/L (ref 135–145)
Total Bilirubin: 1 mg/dL (ref 0.3–1.2)
Total Protein: 5.9 g/dL — ABNORMAL LOW (ref 6.5–8.1)

## 2019-08-28 LAB — LIPASE, BLOOD: Lipase: 27 U/L (ref 11–51)

## 2019-08-28 MED ORDER — SODIUM CHLORIDE 0.9 % IV BOLUS
1000.0000 mL | Freq: Once | INTRAVENOUS | Status: AC
  Administered 2019-08-28: 1000 mL via INTRAVENOUS

## 2019-08-28 NOTE — ED Triage Notes (Addendum)
Pt c/o diarrhea x 2 weeks; husband says that she is sleeping a lot and not eating. Pt had stroke in December, had home rehab, but husband said that rehab stopped coming out because "it wasn't helping".

## 2019-08-29 ENCOUNTER — Telehealth: Payer: Self-pay | Admitting: *Deleted

## 2019-08-29 LAB — URINALYSIS, ROUTINE W REFLEX MICROSCOPIC
Bilirubin Urine: NEGATIVE
Glucose, UA: NEGATIVE mg/dL
Hgb urine dipstick: NEGATIVE
Ketones, ur: 20 mg/dL — AB
Leukocytes,Ua: NEGATIVE
Nitrite: NEGATIVE
Protein, ur: 30 mg/dL — AB
Specific Gravity, Urine: 1.015 (ref 1.005–1.030)
pH: 6 (ref 5.0–8.0)

## 2019-08-29 MED ORDER — SODIUM CHLORIDE 0.9 % IV BOLUS
500.0000 mL | Freq: Once | INTRAVENOUS | Status: AC
  Administered 2019-08-29: 500 mL via INTRAVENOUS

## 2019-08-29 MED ORDER — POTASSIUM CHLORIDE ER 10 MEQ PO TBCR
20.0000 meq | EXTENDED_RELEASE_TABLET | Freq: Every day | ORAL | 0 refills | Status: DC
Start: 2019-08-29 — End: 2019-09-16

## 2019-08-29 MED ORDER — POTASSIUM CHLORIDE CRYS ER 20 MEQ PO TBCR
40.0000 meq | EXTENDED_RELEASE_TABLET | Freq: Once | ORAL | Status: AC
  Administered 2019-08-29: 40 meq via ORAL
  Filled 2019-08-29: qty 2

## 2019-08-29 NOTE — ED Provider Notes (Signed)
[facility] EMERGENCY DEPARTMENT Provider Note  CSN: [id number] Arrival date & time: 08/28/19 1851  Chief Complaint(s) Weakness  HPI Patricia Stanley is a 74 y.o. female with a past medical history listed below including right MCA stroke in December resulting in left-sided deficits and requiring right carotid endarterectomy.  She presents today for generalized fatigue for the past several weeks as well as worsening of her diarrhea that is been ongoing since her admission December.  Patient was getting physical therapy at home but this stopped 4 weeks ago.  Husband reports that her motivation for continuing with physical therapy was minimal.  They encouraged her to increase her activity but patient declines.  In addition they have difficulty getting the patient to eat and hydrate well.  They deny any nausea or emesis but the patient reports that her taste is off.  No recent fevers or chills.  No coughing or congestion.  No abdominal pain.  No urinary symptoms.  No falls or trauma.  No change in her left-sided deficits.  HPI  Past Medical History Past Medical History:  Diagnosis Date  . Closed fracture of coccyx (HCC) 2015   AFTER FALL  . Depression   . GERD (gastroesophageal reflux disease)   . Headache    MIGRAINES OCCASIONAL  . History of kidney stones    HAS NOW  . Hypertension    Patient Active Problem List   Diagnosis Date Noted  . Moderate episode of recurrent major depressive disorder (HCC)   . Small vessel cerebrovascular accident (CVA) (HCC) 04/25/2019  . Acute CVA (cerebrovascular accident) (HCC) 04/18/2019  . Weight loss, unintentional 04/18/2019  . Anxiety 04/18/2019  . Major depression in partial remission (HCC) 08/25/2012  . Osteoporosis, unspecified 08/25/2012  . HTN (hypertension) 08/25/2012  . Fall at home 08/24/2012  . Pelvic fracture (HCC) 08/24/2012  . Migraines 08/24/2012  . Renal insufficiency, mild 08/24/2012   Home Medication(s) Prior  to Admission medications   Medication Sig Start Date End Date Taking? Authorizing Provider  aspirin 325 MG tablet Take 1 tablet (325 mg total) by mouth daily. 04/26/19   Swayze, Ava, DO  atenolol (TENORMIN) 50 MG tablet Take 1 tablet (50 mg total) by mouth every evening. 05/09/19   Angiulli, Mcarthur Rossetti, PA-C  atorvastatin (LIPITOR) 80 MG tablet Take 1 tablet (80 mg total) by mouth daily at 6 PM. 05/09/19   Angiulli, Mcarthur Rossetti, PA-C  clonazePAM (KLONOPIN) 1 MG tablet 1 tab every a.m. and 2 tabs nightly 05/09/19   Angiulli, Mcarthur Rossetti, PA-C  docusate sodium (COLACE) 100 MG capsule Take 1 capsule (100 mg total) by mouth daily. 05/20/19   Raulkar, Drema Pry, MD  hydrALAZINE (APRESOLINE) 25 MG tablet Take 1 tablet (25 mg total) by mouth every 8 (eight) hours. 05/09/19   Angiulli, Mcarthur Rossetti, PA-C  lisinopril (ZESTRIL) 10 MG tablet Take 1 tablet (10 mg total) by mouth daily. 05/09/19   Angiulli, Mcarthur Rossetti, PA-C  methocarbamol (ROBAXIN) 500 MG tablet Take 1 tablet (500 mg total) by mouth daily. 05/10/19   Angiulli, Mcarthur Rossetti, PA-C  Multiple Vitamin (MULTIVITAMIN WITH MINERALS) TABS tablet Take 1 tablet by mouth daily. 05/10/19   Angiulli, Mcarthur Rossetti, PA-C  pantoprazole (PROTONIX) 40 MG tablet Take 1 tablet (40 mg total) by mouth 2 (two) times daily before a meal. 05/09/19   Angiulli, Mcarthur Rossetti, PA-C  potassium chloride (KLOR-CON) 10 MEQ tablet Take 2 tablets (20 mEq total) by mouth daily for 10 days. 08/29/19 09/08/19  Kanijah Groseclose,  Amadeo Garnet, MD  traZODone (DESYREL) 50 MG tablet Take 1 tablet (50 mg total) by mouth at bedtime. 05/09/19   Angiulli, Mcarthur Rossetti, PA-C                                                                                                                                    Past Surgical History Past Surgical History:  Procedure Laterality Date  . ABDOMINAL HYSTERECTOMY     1 OVARY LEFT  . APPENDECTOMY  AGE 81   DONE WITH OVARY REMOVAL  . COLONOSCOPY WITH PROPOFOL N/A 12/03/2015   Procedure: COLONOSCOPY WITH PROPOFOL;   Surgeon: Charolett Bumpers, MD;  Location: [facility];  Service: Endoscopy;  Laterality: N/A;  . ENDARTERECTOMY Right 04/21/2019   Procedure: ENDARTERECTOMY CAROTID;  Surgeon: Cephus Shelling, MD;  Location: [facility] OR;  Service: Vascular;  Laterality: Right;  . ESOPHAGOGASTRODUODENOSCOPY (EGD) WITH PROPOFOL N/A 12/03/2015   Procedure: ESOPHAGOGASTRODUODENOSCOPY (EGD) WITH PROPOFOL;  Surgeon: Charolett Bumpers, MD;  Location: [facility];  Service: Endoscopy;  Laterality: N/A;  . TONSILLECTOMY  AGE 86   AND ADENOIDS REMOVED   Family History No family history on file.  Social History Social History   Tobacco Use  . Smoking status: Former Smoker    Quit date: 05/06/1995    Years since quitting: 24.3  . Smokeless tobacco: Never Used  Substance Use Topics  . Alcohol use: No  . Drug use: No   Allergies Paroxetine  Review of Systems Review of Systems All other systems are reviewed and are negative for acute change except as noted in the HPI  Physical Exam Vital Signs  I have reviewed the triage vital signs BP (!) 157/71   Pulse 85   Temp 98.9 F (37.2 C) (Oral)   Resp 16   SpO2 100%   Physical Exam Vitals reviewed.  Constitutional:      General: She is not in acute distress.    Appearance: She is well-developed. She is not diaphoretic.  HENT:     Head: Normocephalic and atraumatic.     Nose: Nose normal.  Eyes:     General: No scleral icterus.       Right eye: No discharge.        Left eye: No discharge.     Conjunctiva/sclera: Conjunctivae normal.     Pupils: Pupils are equal, round, and reactive to light.  Cardiovascular:     Rate and Rhythm: Normal rate and regular rhythm.     Heart sounds: No murmur. No friction rub. No gallop.   Pulmonary:     Effort: Pulmonary effort is normal. No respiratory distress.     Breath sounds: Normal breath sounds. No stridor. No rales.  Abdominal:     General: There is no distension.     Palpations: Abdomen is soft.      Tenderness: There is no abdominal tenderness.  Musculoskeletal:  General: No tenderness.     Cervical back: Normal range of motion and neck supple.  Skin:    General: Skin is warm and dry.     Findings: No erythema or rash.  Neurological:     Mental Status: She is alert and oriented to person, place, and time.     Comments: Mental Status:  Alert and oriented to person, place, and time.  Attention and concentration normal.  Speech clear.  Recent memory is intact  Cranial Nerves:  II Visual Fields: Intact to confrontation. Visual fields intact. III, IV, VI: Pupils equal and reactive to light and near. Full eye movement without nystagmus  V Facial Sensation: Normal. No weakness of masticatory muscles  VII: No facial weakness or asymmetry  VIII Auditory Acuity: Grossly normal  IX/X: The uvula is midline; the palate elevates symmetrically  XI: Normal sternocleidomastoid and trapezius strength  XII: The tongue is midline. No atrophy or fasciculations.   Motor System: Muscle Strength: 5/5 to right extremities. 4/5 to left extremities.   Muscle Tone: Tone and muscle bulk are normal in the upper and lower extremities.   Reflexes: DTRs: 1+ and symmetrical in all four extremities. No Clonus Coordination: Intact finger-to-nose, heel-to-shin. No tremor.  Sensation: Intact to light touch. Negative Romberg test.  Gait: ataxic, LLE drag.   *husband and patient report that her walking seems better here. Attributed to better "effort."      ED Results and Treatments Labs (all labs ordered are listed, but only abnormal results are displayed) Labs Reviewed  COMPREHENSIVE METABOLIC PANEL - Abnormal; Notable for the following components:      Result Value   Potassium 3.3 (*)    Glucose, Bld 101 (*)    Calcium 8.3 (*)    Total Protein 5.9 (*)    Albumin 2.3 (*)    All other components within normal limits  CBC - Abnormal; Notable for the following components:   RBC 3.36 (*)     Hemoglobin 9.6 (*)    HCT 30.9 (*)    All other components within normal limits  URINALYSIS, ROUTINE W REFLEX MICROSCOPIC - Abnormal; Notable for the following components:   Ketones, ur 20 (*)    Protein, ur 30 (*)    Bacteria, UA RARE (*)    All other components within normal limits  LIPASE, BLOOD                                                                                                                         EKG  EKG Interpretation  Date/Time:    Ventricular Rate:    PR Interval:    QRS Duration:   QT Interval:    QTC Calculation:   R Axis:     Text Interpretation:        Radiology No results found.  Pertinent labs & imaging results that were available during my care of the patient were reviewed by me and considered in my medical decision making (  see chart for details).  Medications Ordered in ED Medications  sodium chloride 0.9 % bolus 1,000 mL (0 mLs Intravenous Stopped 08/29/19 0132)  sodium chloride 0.9 % bolus 500 mL (0 mLs Intravenous Stopped 08/29/19 0258)  potassium chloride SA (KLOR-CON) CR tablet 40 mEq (40 mEq Oral Given 08/29/19 0205)                                                                                                                                    Procedures Procedures  (including critical care time)  Medical Decision Making / ED Course I have reviewed the nursing notes for this encounter and the patient's prior records (if available in EHR or on provided paperwork).   Patricia Stanley was evaluated in Emergency Department on 08/29/2019 for the symptoms described in the history of present illness. She was evaluated in the context of the global COVID-19 pandemic, which necessitated consideration that the patient might be at risk for infection with the SARS-CoV-2 virus that causes COVID-19. Institutional protocols and algorithms that pertain to the evaluation of patients at risk for COVID-19 are in a state of rapid change based on information  released by regulatory bodies including the CDC and federal and state organizations. These policies and algorithms were followed during the patient's care in the ED.  Generalized fatigue. Worsened diarrhea. AFVSS. Stable deficits. abd benign. Labs: stable Hb. Notable mild hypoK+. No renal insufficiency. Provided with IVF. Will like to see UA, given her worsening diarrhea to rule out UTI.  UA w/o infection.  Regardless, she seems stable for OP management. She already has appt with PCP this week. Offered Case management for home PT.      Final Clinical Impression(s) / ED Diagnoses Final diagnoses:  Generalized weakness  Hypokalemia   The patient appears reasonably screened and/or stabilized for discharge and I doubt any other medical condition or other [facility] requiring further screening, evaluation, or treatment in the ED at this time prior to discharge. Safe for discharge with strict return precautions.  Disposition: Discharge  Condition: Good  I have discussed the results, Dx and Tx plan with the patient/family who expressed understanding and agree(s) with the plan. Discharge instructions discussed at length. The patient/family was given strict return precautions who verbalized understanding of the instructions. No further questions at time of discharge.    ED Discharge Orders         Ordered    potassium chloride (KLOR-CON) 10 MEQ tablet  Daily     08/29/19 0418            Follow Up: Marden Noble, MD [address] [place] [address] [phone]  On 09/01/2019 As scheduled      This chart was dictated using voice recognition software.  Despite best efforts to proofread,  errors can occur which can change the documentation meaning.   Nira Conn, MD 08/29/19 [phone]

## 2019-08-29 NOTE — Telephone Encounter (Signed)
Pt currently active with [facility] for home health services.  Resumption of care requested. Dara Hoyer, RN of [facility]  notified.

## 2019-08-29 NOTE — ED Notes (Signed)
Pt bladder scanned with 180 mL urine found to be in bladder.

## 2019-08-29 NOTE — Discharge Instructions (Addendum)
Case management will be in touch regarding your home health/physical therapy.

## 2019-09-11 ENCOUNTER — Other Ambulatory Visit: Payer: Self-pay

## 2019-09-11 ENCOUNTER — Inpatient Hospital Stay (HOSPITAL_COMMUNITY)
Admission: EM | Admit: 2019-09-11 | Discharge: 2019-09-16 | DRG: 392 | Disposition: A | Discharge status: Home Health | Payer: Medicare HMO | Attending: Internal Medicine | Admitting: Internal Medicine

## 2019-09-11 ENCOUNTER — Encounter (HOSPITAL_COMMUNITY): Payer: Self-pay

## 2019-09-11 ENCOUNTER — Emergency Department (HOSPITAL_COMMUNITY): Payer: Medicare HMO

## 2019-09-11 DIAGNOSIS — Z79899 Other long term (current) drug therapy: Secondary | ICD-10-CM | POA: Diagnosis not present

## 2019-09-11 DIAGNOSIS — R5381 Other malaise: Secondary | ICD-10-CM | POA: Diagnosis not present

## 2019-09-11 DIAGNOSIS — E876 Hypokalemia: Secondary | ICD-10-CM | POA: Diagnosis not present

## 2019-09-11 DIAGNOSIS — Z681 Body mass index (BMI) 19 or less, adult: Secondary | ICD-10-CM | POA: Diagnosis not present

## 2019-09-11 DIAGNOSIS — M2550 Pain in unspecified joint: Secondary | ICD-10-CM | POA: Diagnosis present

## 2019-09-11 DIAGNOSIS — R197 Diarrhea, unspecified: Secondary | ICD-10-CM | POA: Diagnosis not present

## 2019-09-11 DIAGNOSIS — R64 Cachexia: Secondary | ICD-10-CM | POA: Diagnosis present

## 2019-09-11 DIAGNOSIS — N200 Calculus of kidney: Secondary | ICD-10-CM | POA: Diagnosis not present

## 2019-09-11 DIAGNOSIS — K838 Other specified diseases of biliary tract: Secondary | ICD-10-CM | POA: Diagnosis not present

## 2019-09-11 DIAGNOSIS — Z66 Do not resuscitate: Secondary | ICD-10-CM | POA: Diagnosis not present

## 2019-09-11 DIAGNOSIS — Z8249 Family history of ischemic heart disease and other diseases of the circulatory system: Secondary | ICD-10-CM | POA: Diagnosis not present

## 2019-09-11 DIAGNOSIS — H9190 Unspecified hearing loss, unspecified ear: Secondary | ICD-10-CM | POA: Diagnosis present

## 2019-09-11 DIAGNOSIS — F331 Major depressive disorder, recurrent, moderate: Secondary | ICD-10-CM

## 2019-09-11 DIAGNOSIS — F5105 Insomnia due to other mental disorder: Secondary | ICD-10-CM | POA: Diagnosis present

## 2019-09-11 DIAGNOSIS — F419 Anxiety disorder, unspecified: Secondary | ICD-10-CM | POA: Diagnosis present

## 2019-09-11 DIAGNOSIS — Z87442 Personal history of urinary calculi: Secondary | ICD-10-CM

## 2019-09-11 DIAGNOSIS — I952 Hypotension due to drugs: Secondary | ICD-10-CM | POA: Diagnosis present

## 2019-09-11 DIAGNOSIS — Z20822 Contact with and (suspected) exposure to covid-19: Secondary | ICD-10-CM | POA: Diagnosis not present

## 2019-09-11 DIAGNOSIS — R0602 Shortness of breath: Secondary | ICD-10-CM | POA: Diagnosis not present

## 2019-09-11 DIAGNOSIS — R54 Age-related physical debility: Secondary | ICD-10-CM | POA: Diagnosis present

## 2019-09-11 DIAGNOSIS — I10 Essential (primary) hypertension: Secondary | ICD-10-CM | POA: Diagnosis present

## 2019-09-11 DIAGNOSIS — F3341 Major depressive disorder, recurrent, in partial remission: Secondary | ICD-10-CM | POA: Diagnosis not present

## 2019-09-11 DIAGNOSIS — D649 Anemia, unspecified: Secondary | ICD-10-CM | POA: Diagnosis present

## 2019-09-11 DIAGNOSIS — K8689 Other specified diseases of pancreas: Secondary | ICD-10-CM | POA: Diagnosis not present

## 2019-09-11 DIAGNOSIS — E785 Hyperlipidemia, unspecified: Secondary | ICD-10-CM | POA: Diagnosis present

## 2019-09-11 DIAGNOSIS — Z515 Encounter for palliative care: Secondary | ICD-10-CM

## 2019-09-11 DIAGNOSIS — Z83438 Family history of other disorder of lipoprotein metabolism and other lipidemia: Secondary | ICD-10-CM | POA: Diagnosis not present

## 2019-09-11 DIAGNOSIS — R634 Abnormal weight loss: Secondary | ICD-10-CM

## 2019-09-11 DIAGNOSIS — I69354 Hemiplegia and hemiparesis following cerebral infarction affecting left non-dominant side: Secondary | ICD-10-CM | POA: Diagnosis not present

## 2019-09-11 DIAGNOSIS — E440 Moderate protein-calorie malnutrition: Secondary | ICD-10-CM | POA: Diagnosis present

## 2019-09-11 DIAGNOSIS — K529 Noninfective gastroenteritis and colitis, unspecified: Principal | ICD-10-CM | POA: Diagnosis present

## 2019-09-11 DIAGNOSIS — R627 Adult failure to thrive: Secondary | ICD-10-CM | POA: Diagnosis present

## 2019-09-11 DIAGNOSIS — K219 Gastro-esophageal reflux disease without esophagitis: Secondary | ICD-10-CM | POA: Diagnosis present

## 2019-09-11 DIAGNOSIS — Z7982 Long term (current) use of aspirin: Secondary | ICD-10-CM

## 2019-09-11 DIAGNOSIS — Z87891 Personal history of nicotine dependence: Secondary | ICD-10-CM

## 2019-09-11 DIAGNOSIS — F324 Major depressive disorder, single episode, in partial remission: Secondary | ICD-10-CM | POA: Diagnosis present

## 2019-09-11 DIAGNOSIS — J9811 Atelectasis: Secondary | ICD-10-CM | POA: Diagnosis not present

## 2019-09-11 DIAGNOSIS — R0902 Hypoxemia: Secondary | ICD-10-CM | POA: Diagnosis not present

## 2019-09-11 DIAGNOSIS — I959 Hypotension, unspecified: Secondary | ICD-10-CM | POA: Diagnosis not present

## 2019-09-11 DIAGNOSIS — T465X5A Adverse effect of other antihypertensive drugs, initial encounter: Secondary | ICD-10-CM | POA: Diagnosis present

## 2019-09-11 DIAGNOSIS — Z7189 Other specified counseling: Secondary | ICD-10-CM | POA: Diagnosis not present

## 2019-09-11 DIAGNOSIS — R531 Weakness: Secondary | ICD-10-CM | POA: Diagnosis not present

## 2019-09-11 DIAGNOSIS — K117 Disturbances of salivary secretion: Secondary | ICD-10-CM | POA: Diagnosis present

## 2019-09-11 LAB — CBC WITH DIFFERENTIAL/PLATELET
Abs Immature Granulocytes: 0.05 10*3/uL (ref 0.00–0.07)
Basophils Absolute: 0 10*3/uL (ref 0.0–0.1)
Basophils Relative: 1 %
Eosinophils Absolute: 0.4 10*3/uL (ref 0.0–0.5)
Eosinophils Relative: 5 %
HCT: 25 % — ABNORMAL LOW (ref 36.0–46.0)
Hemoglobin: 7.8 g/dL — ABNORMAL LOW (ref 12.0–15.0)
Immature Granulocytes: 1 %
Lymphocytes Relative: 8 %
Lymphs Abs: 0.7 10*3/uL (ref 0.7–4.0)
MCH: 28.2 pg (ref 26.0–34.0)
MCHC: 31.2 g/dL (ref 30.0–36.0)
MCV: 90.3 fL (ref 80.0–100.0)
Monocytes Absolute: 0.7 10*3/uL (ref 0.1–1.0)
Monocytes Relative: 8 %
Neutro Abs: 6.4 10*3/uL (ref 1.7–7.7)
Neutrophils Relative %: 77 %
Platelets: 248 10*3/uL (ref 150–400)
RBC: 2.77 MIL/uL — ABNORMAL LOW (ref 3.87–5.11)
RDW: 14.7 % (ref 11.5–15.5)
WBC: 8.3 10*3/uL (ref 4.0–10.5)
nRBC: 0 % (ref 0.0–0.2)

## 2019-09-11 LAB — COMPREHENSIVE METABOLIC PANEL
ALT: 15 U/L (ref 0–44)
AST: 23 U/L (ref 15–41)
Albumin: 1.8 g/dL — ABNORMAL LOW (ref 3.5–5.0)
Alkaline Phosphatase: 65 U/L (ref 38–126)
Anion gap: 10 (ref 5–15)
BUN: 15 mg/dL (ref 8–23)
CO2: 23 mmol/L (ref 22–32)
Calcium: 8.3 mg/dL — ABNORMAL LOW (ref 8.9–10.3)
Chloride: 107 mmol/L (ref 98–111)
Creatinine, Ser: 0.96 mg/dL (ref 0.44–1.00)
GFR calc Af Amer: >60 mL/min (ref >60)
GFR calc non Af Amer: 59 mL/min — ABNORMAL LOW (ref >60)
Glucose, Bld: 100 mg/dL — ABNORMAL HIGH (ref 70–99)
Potassium: 4.1 mmol/L (ref 3.5–5.1)
Sodium: 140 mmol/L (ref 135–145)
Total Bilirubin: 0.7 mg/dL (ref 0.3–1.2)
Total Protein: 5.3 g/dL — ABNORMAL LOW (ref 6.5–8.1)

## 2019-09-11 LAB — POC OCCULT BLOOD, ED: Fecal Occult Bld: NEGATIVE

## 2019-09-11 LAB — TROPONIN I (HIGH SENSITIVITY): Troponin I (High Sensitivity): 5 ng/L (ref <18)

## 2019-09-11 LAB — TSH: TSH: 0.769 u[IU]/mL (ref 0.350–4.500)

## 2019-09-11 MED ORDER — LACTATED RINGERS IV SOLN: INTRAVENOUS | Status: DC

## 2019-09-11 MED ORDER — SODIUM CHLORIDE 0.9 % IV SOLN: Freq: Once | INTRAVENOUS | Status: AC

## 2019-09-11 MED ORDER — SODIUM CHLORIDE 0.9 % IV BOLUS
1000.0000 mL | Freq: Once | INTRAVENOUS | Status: AC
  Administered 2019-09-11: 21:00:00 1000 mL via INTRAVENOUS

## 2019-09-11 NOTE — ED Provider Notes (Signed)
[facility] EMERGENCY DEPARTMENT Provider Note   CSN: [id number] Arrival date & time: 09/11/19  2038     History No chief complaint on file. cc - weakness  Patricia Stanley is a 74 y.o. female.  She is brought in by EMS for failure to thrive.  Patient states this is been going on for over a week.  Diarrhea.  Poor p.o. intake.  Per EMS family states this has been a decline since her stroke in December.  Patient denies chest pain.  She says she has some shortness of breath sometimes.  No abdominal pain.  No urinary symptoms.  No fevers or chills.   The history is provided by the patient and the EMS personnel.  Weakness Severity:  Severe Onset quality:  Gradual Duration:  1 week Timing:  Unable to specify Progression:  Worsening Chronicity:  New Relieved by:  Nothing Worsened by:  Nothing Ineffective treatments:  None tried Associated symptoms: diarrhea, headaches and shortness of breath   Associated symptoms: no abdominal pain, no aphasia, no chest pain, no cough, no dysuria, no numbness in extremities, no fever, no foul-smelling urine, no frequency, no melena, no nausea and no vomiting   Risk factors: neurologic disease        Past Medical History:  Diagnosis Date  . Closed fracture of coccyx (HCC) 2015   AFTER FALL  . Depression   . GERD (gastroesophageal reflux disease)   . Headache    MIGRAINES OCCASIONAL  . History of kidney stones    HAS NOW  . Hypertension     Patient Active Problem List   Diagnosis Date Noted  . Moderate episode of recurrent major depressive disorder (HCC)   . Small vessel cerebrovascular accident (CVA) (HCC) 04/25/2019  . Acute CVA (cerebrovascular accident) (HCC) 04/18/2019  . Weight loss, unintentional 04/18/2019  . Anxiety 04/18/2019  . Major depression in partial remission (HCC) 08/25/2012  . Osteoporosis, unspecified 08/25/2012  . HTN (hypertension) 08/25/2012  . Fall at home 08/24/2012  . Pelvic fracture (HCC)  08/24/2012  . Migraines 08/24/2012  . Renal insufficiency, mild 08/24/2012    Past Surgical History:  Procedure Laterality Date  . ABDOMINAL HYSTERECTOMY     1 OVARY LEFT  . APPENDECTOMY  AGE 31   DONE WITH OVARY REMOVAL  . COLONOSCOPY WITH PROPOFOL N/A 12/03/2015   Procedure: COLONOSCOPY WITH PROPOFOL;  Surgeon: Charolett Bumpers, MD;  Location: [facility] ENDOSCOPY;  Service: Endoscopy;  Laterality: N/A;  . ENDARTERECTOMY Right 04/21/2019   Procedure: ENDARTERECTOMY CAROTID;  Surgeon: Cephus Shelling, MD;  Location: [facility] OR;  Service: Vascular;  Laterality: Right;  . ESOPHAGOGASTRODUODENOSCOPY (EGD) WITH PROPOFOL N/A 12/03/2015   Procedure: ESOPHAGOGASTRODUODENOSCOPY (EGD) WITH PROPOFOL;  Surgeon: Charolett Bumpers, MD;  Location: [facility] ENDOSCOPY;  Service: Endoscopy;  Laterality: N/A;  . TONSILLECTOMY  AGE 16   AND ADENOIDS REMOVED     OB History   No obstetric history on file.     No family history on file.  Social History   Tobacco Use  . Smoking status: Former Smoker    Quit date: 05/06/1995    Years since quitting: 24.3  . Smokeless tobacco: Never Used  Substance Use Topics  . Alcohol use: No  . Drug use: No    Home Medications Prior to Admission medications   Medication Sig Start Date End Date Taking? Authorizing Provider  aspirin 325 MG tablet Take 1 tablet (325 mg total) by mouth daily. 04/26/19   Swayze, Ava, DO  atenolol (TENORMIN) 50 MG tablet Take 1 tablet (50 mg total) by mouth every evening. 05/09/19   Angiulli, Lavon Paganini, PA-C  atorvastatin (LIPITOR) 80 MG tablet Take 1 tablet (80 mg total) by mouth daily at 6 PM. 05/09/19   Angiulli, Lavon Paganini, PA-C  clonazePAM (KLONOPIN) 1 MG tablet 1 tab every a.m. and 2 tabs nightly 05/09/19   Angiulli, Lavon Paganini, PA-C  docusate sodium (COLACE) 100 MG capsule Take 1 capsule (100 mg total) by mouth daily. 05/20/19   Raulkar, Clide Deutscher, MD  hydrALAZINE (APRESOLINE) 25 MG tablet Take 1 tablet (25 mg total) by mouth every 8 (eight) hours.  05/09/19   Angiulli, Lavon Paganini, PA-C  lisinopril (ZESTRIL) 10 MG tablet Take 1 tablet (10 mg total) by mouth daily. 05/09/19   Angiulli, Lavon Paganini, PA-C  methocarbamol (ROBAXIN) 500 MG tablet Take 1 tablet (500 mg total) by mouth daily. 05/10/19   Angiulli, Lavon Paganini, PA-C  Multiple Vitamin (MULTIVITAMIN WITH MINERALS) TABS tablet Take 1 tablet by mouth daily. 05/10/19   Angiulli, Lavon Paganini, PA-C  pantoprazole (PROTONIX) 40 MG tablet Take 1 tablet (40 mg total) by mouth 2 (two) times daily before a meal. 05/09/19   Angiulli, Lavon Paganini, PA-C  potassium chloride (KLOR-CON) 10 MEQ tablet Take 2 tablets (20 mEq total) by mouth daily for 10 days. 08/29/19 09/08/19  Fatima Blank, MD  traZODone (DESYREL) 50 MG tablet Take 1 tablet (50 mg total) by mouth at bedtime. 05/09/19   Angiulli, Lavon Paganini, PA-C    Allergies    Paroxetine  Review of Systems   Review of Systems  Constitutional: Negative for fever.  HENT: Negative for sore throat.   Eyes: Negative for visual disturbance.  Respiratory: Positive for shortness of breath. Negative for cough.   Cardiovascular: Negative for chest pain.  Gastrointestinal: Positive for diarrhea. Negative for abdominal pain, melena, nausea and vomiting.  Genitourinary: Negative for dysuria and frequency.  Musculoskeletal: Negative for neck pain.  Skin: Negative for rash.  Neurological: Positive for weakness and headaches.    Physical Exam Updated Vital Signs BP (!) 113/52 (BP Location: Right Arm)   Pulse 65   Temp 98.1 F (36.7 C) (Oral)   Resp 12   Ht 5\' 1"  (1.549 m)   Wt 43.9 kg   SpO2 97%   BMI 18.29 kg/m   Physical Exam Vitals and nursing note reviewed.  Constitutional:      General: She is not in acute distress.    Appearance: She is well-developed.  HENT:     Head: Normocephalic and atraumatic.  Eyes:     Conjunctiva/sclera: Conjunctivae normal.  Cardiovascular:     Rate and Rhythm: Normal rate and regular rhythm.     Heart sounds: No murmur.    Pulmonary:     Effort: Pulmonary effort is normal. No respiratory distress.     Breath sounds: Normal breath sounds.  Abdominal:     Palpations: Abdomen is soft.     Tenderness: There is no abdominal tenderness.  Musculoskeletal:        General: No deformity or signs of injury. Normal range of motion.     Cervical back: Neck supple.  Skin:    General: Skin is warm and dry.     Capillary Refill: Capillary refill takes less than 2 seconds.  Neurological:     General: No focal deficit present.     Mental Status: She is alert.     Sensory: No sensory deficit.  Motor: No weakness.     Comments: She is moving all 4 extremities nonfocal he although she has giving poor effort in all over them.     ED Results / Procedures / Treatments   Labs (all labs ordered are listed, but only abnormal results are displayed) Labs Reviewed  COMPREHENSIVE METABOLIC PANEL - Abnormal; Notable for the following components:      Result Value   Glucose, Bld 100 (*)    Calcium 8.3 (*)    Total Protein 5.3 (*)    Albumin 1.8 (*)    GFR calc non Af Amer 59 (*)    All other components within normal limits  CBC WITH DIFFERENTIAL/PLATELET - Abnormal; Notable for the following components:   RBC 2.77 (*)    Hemoglobin 7.8 (*)    HCT 25.0 (*)    All other components within normal limits  IRON AND TIBC - Abnormal; Notable for the following components:   Iron 15 (*)    TIBC 169 (*)    Saturation Ratios 9 (*)    All other components within normal limits  BASIC METABOLIC PANEL - Abnormal; Notable for the following components:   Calcium 8.4 (*)    All other components within normal limits  CBC - Abnormal; Notable for the following components:   RBC 2.92 (*)    Hemoglobin 8.2 (*)    HCT 26.1 (*)    All other components within normal limits  HEMOGLOBIN A1C - Abnormal; Notable for the following components:   Hgb A1c MFr Bld 5.7 (*)    All other components within normal limits  MAGNESIUM - Abnormal;  Notable for the following components:   Magnesium 1.6 (*)    All other components within normal limits  RESPIRATORY PANEL BY RT PCR (FLU A&B, COVID)  URINE CULTURE  GASTROINTESTINAL PANEL BY PCR, STOOL (REPLACES STOOL CULTURE)  TSH  FERRITIN  FOLATE  VITAMIN B12  TSH  PHOSPHORUS  URINALYSIS, ROUTINE W REFLEX MICROSCOPIC  VITAMIN D 25 HYDROXY (VIT D DEFICIENCY, FRACTURES)  POC OCCULT BLOOD, ED  TROPONIN I (HIGH SENSITIVITY)  TROPONIN I (HIGH SENSITIVITY)  TROPONIN I (HIGH SENSITIVITY)    EKG EKG Interpretation  Date/Time:  Sunday Sep 11 2019 21:17:04 EDT Ventricular Rate:  65 PR Interval:    QRS Duration: 76 QT Interval:  460 QTC Calculation: 479 R Axis:   43 Text Interpretation: Sinus rhythm Low voltage, precordial leads Borderline T abnormalities, anterior leads compared with prior 12/20, new nonspecific ts anterior Confirmed by Meridee Score [phone]) on 09/11/2019 9:20:57 PM   Radiology DG Chest Port 1 View  Result Date: 09/11/2019 CLINICAL DATA:  Weakness EXAM: PORTABLE CHEST 1 VIEW COMPARISON:  04/19/2019 FINDINGS: The lung volumes are somewhat low. There are hazy ill-defined opacities at the lung bases bilaterally. There is no pneumothorax. No large pleural effusion. The heart size is normal. Aortic calcifications are noted. There is no acute osseous abnormality. IMPRESSION: Low lung volumes with bibasilar atelectasis. Electronically Signed   By: Katherine Mantle M.D.   On: 09/11/2019 21:34    Procedures Procedures (including critical care time)  Medications Ordered in ED Medications  aspirin EC tablet 325 mg (has no administration in time range)  atenolol (TENORMIN) tablet 50 mg (50 mg Oral Given 09/12/19 1002)  atorvastatin (LIPITOR) tablet 80 mg (has no administration in time range)  FLUoxetine (PROZAC) capsule 40 mg (40 mg Oral Given 09/12/19 1002)  multivitamin with minerals tablet 1 tablet (1 tablet Oral Not Given 09/12/19 1005)  pantoprazole (PROTONIX) EC  tablet 40 mg (40 mg Oral Given 09/12/19 1002)  sodium chloride flush (NS) 0.9 % injection 3 mL (3 mLs Intravenous Not Given 09/12/19 1006)  lactated ringers infusion ( Intravenous New Bag/Given (Non-Interop) 09/12/19 0142)  magnesium sulfate IVPB 2 g 50 mL (2 g Intravenous New Bag/Given 09/12/19 1005)  sodium chloride 0.9 % bolus 1,000 mL (0 mLs Intravenous Stopped 09/11/19 2205)  0.9 %  sodium chloride infusion ( Intravenous Stopped 09/12/19 0020)    ED Course  I have reviewed the triage vital signs and the nursing notes.  Pertinent labs & imaging results that were available during my care of the patient were reviewed by me and considered in my medical decision making (see chart for details).  Clinical Course as of Sep 11 1009  Wynelle Link Sep 11, 2019  2130 X-ray interpreted by me as no gross infiltrates.  Awaiting radiology reading.   [MB]  2237 Rectal exam done by nurse as chaperone.  Normal sphincter tone no masses.  Sample sent to lab for guaiac   [MB]  2237 Daughter here, says she is been having diarrhea since her stroke in December.  Losing weight.  Becoming increasingly weak and not able to stand today.   [MB]  2304 Albumin(!): 1.8 [MB]  2306 Discussed with [facility] hospitalist Dr.Girguis who will evaluate the patient for admission.   [MB]    Clinical Course User Index [MB] Terrilee Files, MD   MDM Rules/Calculators/A&P                     This patient complains of weakness diarrhea; this involves an extensive number of treatment Options and is a complaint that carries with it a high risk of complications and Morbidity. The differential includes malnutrition, dehydration, renal failure, metabolic derangement, infection, ACS  I ordered, reviewed and interpreted labs, which included CBC which shows a hemoglobin has been steadily declining over the last 4 months.  Chemistry reasonably normal except for low protein low albumin consistent with malnutrition.  TSH normal.  Covid and flu testing  negative.  Troponin I ordered medication IV fluids I ordered imaging studies which included chest x-ray and I independently    visualized and interpreted imaging which showed no acute infiltrates Additional history obtained from daughter Previous records obtained and reviewed in epic including admission in December for stroke and last ED visit a few weeks ago I consulted [facility] hospitalist Dr.Girguis  And discussed lab and imaging findings  Critical Interventions: None  After the interventions stated above, I reevaluated the patient and found patient is in be hemodynamically stable but not particularly improved.  She will need admission for further work-up of her anemia diarrhea and malnutrition.   Final Clinical Impression(s) / ED Diagnoses Final diagnoses:  FTT (failure to thrive) in adult  Anemia, unspecified type  Chronic diarrhea    Rx / DC Orders ED Discharge Orders    None       Terrilee Files, MD 09/12/19 1014

## 2019-09-11 NOTE — H&P (Addendum)
History and Physical    Patricia Stanley  ZOX:[id number]  DOB: [birth year]  DOA: 09/11/2019  PCP: Josetta Huddle, MD Patient coming from: home  Chief Complaint: weakness, diarrhea  I have personally briefly reviewed patient's old medical records in [place]  HPI:  Patricia Stanley is a 74 yo CF with PMH CVA (04/2019) with residual left sided deficits, CAS s/p R CEA (04/2019), MDD, HTN, HLD who presents to the ER with weakness. After her hospitalization in Dec 2020 she was discharged to rehab. At that time she was also having unintentional weight loss and had undergone a work-up with GI.  Her last EGD/CLN had been about 3.5 years prior and was relatively unremarkable.  She also underwent barium swallow which was also normal.  She was brought in by EMS tonight due to worsening weakness and ongoing diarrhea at home.  Of note, she was also recently seen in the ER on 08/29/2019 with ongoing generalized fatigue and diarrhea still.  History is provided by the patient as well as her daughter who is bedside.  Most of the history is provided by the daughter as the patient is very lethargic but she is alert and oriented. Her daughter states that since January she has been overall declining.  She started having significant diarrhea that has been unresolved despite taking Imodium regularly.  There have been no reports of any blood in her diarrhea.  She is eating solid foods as well as Ensure when her appetite has been poor. She denies any difficulty swallowing nor sensation of food getting stuck, nor any history of vomiting/regurgitation. Daughter is unsure when her last endoscopies were, but note review from December 2020 discharge summary states may have been 3.5 years prior. The biggest complaint from her daughter is that the patient has been having ongoing diarrhea and due to this has continued to grow weaker and weaker.  Now it requires almost 2 people to help her get to the bathroom. The patient  also endorses pain and some tingling in her left leg.  She also feels that her balance has been off. Per the daughter, the patient's thought process has remained normal and appropriate.  She has had no confusion or altered mentation.  In addition to her complaints of weakness and diarrhea, her hemoglobin had also been noted to be very slowly downtrending.  Baseline hemoglobin approximately 11-12 back in December 2020; on work-up on 08/28/2019 hemoglobin was 9.6 g/dL and tonight is now 7.8 g/dL. FOBT in the ER was also negative.  A GI pathogen panel was ordered while patient in the ER. Notable labs also include: Albumin 1.8  Hgb 7.8, Hct 25, WBC 8.3, PLTC 248 TSH 0.769 FOBT neg  Assessment/Plan:  Diarrhea - this appears to be the biggest complaint from patient and daughter and due to it seems to be one of the factors contributing to her weakness and failure to thrive lately; diarrhea has been ongoing 3-4 episodes daily for almost 3-4 months now; u/k last CLN but Dec d/c summary says ~3.5 years ago - differential includes microscopic colitis vs infectious (less likely given chronicity, but GI patho panel ordered in ER) vs upper GI malabsorption causing secondary diarrhea - malabsorption may still be contributing given worsening anemia and balance problems over past couple weeks (suspected B12 deficiency) - would benefit from GI consult in am for CLN and biopsies to rule out microscopic colitis; may also need EGD - check B12, folate, Vit D levels  Anemia - suspicion is mixed  anemia; currently normocytic - check iron studies and B12/folate - see diarrhea - start MVI; repletion of others as indicated  Malnutrition - considered due to chronic diarrhea issues; see diarrhea - nutrition consult - continue MVI  Physical deconditioning - PT consult  Hx CVA - Dec 2020 - also underwent R CEA due to CAS as considered culprit - discharged to rehab (see deconditioning) - continue aspirin   MDD  -Continue Prozac  HTN -Continue atenolol  HLD -Continue Lipitor   Code Status:  Code Status History    Date Active Date Inactive Code Status Order ID Comments User Context   04/25/2019 2255 05/10/2019 1541 Full Code [id number]  Lynnae Prude Inpatient   04/25/2019 2255 04/25/2019 2255 Partial Code [id number]  Lynnae Prude Inpatient   04/18/2019 2045 04/25/2019 2240 Partial Code [id number]  John Giovanni, MD ED   08/25/2012 0108 08/28/2012 1609 Full Code [id number]  Tarry Kos, MD Inpatient   Advance Care Planning Activity      DVT Prophylaxis:SCD Anticipated disposition is: pending clinical course  History: Past Medical History:  Diagnosis Date  . Closed fracture of coccyx (HCC) 2015   AFTER FALL  . Depression   . GERD (gastroesophageal reflux disease)   . Headache    MIGRAINES OCCASIONAL  . History of kidney stones    HAS NOW  . Hypertension     Past Surgical History:  Procedure Laterality Date  . ABDOMINAL HYSTERECTOMY     1 OVARY LEFT  . APPENDECTOMY  AGE 29   DONE WITH OVARY REMOVAL  . COLONOSCOPY WITH PROPOFOL N/A 12/03/2015   Procedure: COLONOSCOPY WITH PROPOFOL;  Surgeon: Charolett Bumpers, MD;  Location: [facility];  Service: Endoscopy;  Laterality: N/A;  . ENDARTERECTOMY Right 04/21/2019   Procedure: ENDARTERECTOMY CAROTID;  Surgeon: Cephus Shelling, MD;  Location: [facility] OR;  Service: Vascular;  Laterality: Right;  . ESOPHAGOGASTRODUODENOSCOPY (EGD) WITH PROPOFOL N/A 12/03/2015   Procedure: ESOPHAGOGASTRODUODENOSCOPY (EGD) WITH PROPOFOL;  Surgeon: Charolett Bumpers, MD;  Location: [facility];  Service: Endoscopy;  Laterality: N/A;  . TONSILLECTOMY  AGE 74   AND ADENOIDS REMOVED     reports that she quit smoking about 24 years ago. She has never used smokeless tobacco. She reports that she does not drink alcohol or use drugs.  Allergies  Allergen Reactions  . Paroxetine Other (See Comments)    Shakes-tremors    Family  History  Problem Relation Age of Onset  . Hypertension Mother   . Hyperlipidemia Mother   . Hypertension Father   . Hyperlipidemia Father    Home Medications: Prior to Admission medications   Medication Sig Start Date End Date Taking? Authorizing Provider  FLUoxetine (PROZAC) 40 MG capsule Take 40 mg by mouth in the morning.   Yes [provider]  hydrALAZINE (APRESOLINE) 25 MG tablet Take 1 tablet (25 mg total) by mouth every 8 (eight) hours. 05/09/19  Yes Angiulli, Mcarthur Rossetti, PA-C  Probiotic CAPS Take 1 capsule by mouth in the morning and at bedtime.   Yes [provider]  aspirin 325 MG tablet Take 1 tablet (325 mg total) by mouth daily. 04/26/19   Swayze, Ava, DO  atenolol (TENORMIN) 50 MG tablet Take 1 tablet (50 mg total) by mouth every evening. 05/09/19   Angiulli, Mcarthur Rossetti, PA-C  atorvastatin (LIPITOR) 80 MG tablet Take 1 tablet (80 mg total) by mouth daily at 6 PM. 05/09/19   Angiulli, Mcarthur Rossetti, PA-C  clonazePAM (KLONOPIN) 1 MG  tablet 1 tab every a.m. and 2 tabs nightly 05/09/19   Angiulli, Mcarthur Rossetti, PA-C  docusate sodium (COLACE) 100 MG capsule Take 1 capsule (100 mg total) by mouth daily. 05/20/19   Raulkar, Drema Pry, MD  lisinopril (ZESTRIL) 10 MG tablet Take 1 tablet (10 mg total) by mouth daily. 05/09/19   Angiulli, Mcarthur Rossetti, PA-C  methocarbamol (ROBAXIN) 500 MG tablet Take 1 tablet (500 mg total) by mouth daily. 05/10/19   Angiulli, Mcarthur Rossetti, PA-C  Multiple Vitamin (MULTIVITAMIN WITH MINERALS) TABS tablet Take 1 tablet by mouth daily. 05/10/19   Angiulli, Mcarthur Rossetti, PA-C  pantoprazole (PROTONIX) 40 MG tablet Take 1 tablet (40 mg total) by mouth 2 (two) times daily before a meal. 05/09/19   Angiulli, Mcarthur Rossetti, PA-C  potassium chloride (KLOR-CON) 10 MEQ tablet Take 2 tablets (20 mEq total) by mouth daily for 10 days. Patient not taking: Reported on 09/11/2019 08/29/19 09/11/19  Nira Conn, MD  traZODone (DESYREL) 50 MG tablet Take 1 tablet (50 mg total) by mouth at  bedtime. 05/09/19   Angiulli, Mcarthur Rossetti, PA-C    Review of Systems:  Review of Systems - General ROS: positive for  - fatigue and weight loss Respiratory ROS: negative for - cough or shortness of breath Cardiovascular ROS: negative for - chest pain Gastrointestinal ROS: positive for - diarrhea negative for - abdominal pain or nausea/vomiting Neurological ROS: positive for - numbness/tingling and weakness  Physical Exam: Vitals:   09/11/19 2230 09/11/19 2315 09/11/19 2330 09/11/19 2345  BP: (!) 123/56 (!) 118/56 (!) 120/56 (!) 122/57  Pulse: 66 66 70 70  Resp: 15 13 16 16   Temp:      TempSrc:      SpO2: 98% 97% 97% 97%  Weight:      Height:       General appearance: frail, thin elderly woman laying in bed in NAD but appears weak and is very soft spoken Head: Normocephalic, without obvious abnormality Eyes: EOMI Lungs: clear to auscultation bilaterally Heart: regular rate and rhythm and S1, S2 normal Abdomen: thin, soft, NT, ND, BS present Extremities: no edema Skin: excess skin due to weight loss appreciated, notably in abdomen Neurologic: LUE/LLE 4/5; no paresthesias appreciated; alert and oriented   Labs on Admission:  I have personally reviewed following labs and imaging studies Results for orders placed or performed during the hospital encounter of 09/11/19 (from the past 24 hour(s))  Comprehensive metabolic panel     Status: Abnormal   Collection Time: 09/11/19  9:03 PM  Result Value Ref Range   Sodium 140 135 - 145 mmol/L   Potassium 4.1 3.5 - 5.1 mmol/L   Chloride 107 98 - 111 mmol/L   CO2 23 22 - 32 mmol/L   Glucose, Bld 100 (H) 70 - 99 mg/dL   BUN 15 8 - 23 mg/dL   Creatinine, Ser 11/11/19 0.44 - 1.00 mg/dL   Calcium 8.3 (L) 8.9 - 10.3 mg/dL   Total Protein 5.3 (L) 6.5 - 8.1 g/dL   Albumin 1.8 (L) 3.5 - 5.0 g/dL   AST 23 15 - 41 U/L   ALT 15 0 - 44 U/L   Alkaline Phosphatase 65 38 - 126 U/L   Total Bilirubin 0.7 0.3 - 1.2 mg/dL   GFR calc non Af Amer 59 (L) >60  mL/min   GFR calc Af Amer >60 >60 mL/min   Anion gap 10 5 - 15  Troponin I (High Sensitivity)     Status:  None   Collection Time: 09/11/19  9:03 PM  Result Value Ref Range   Troponin I (High Sensitivity) 5 <18 ng/L  CBC with Differential     Status: Abnormal   Collection Time: 09/11/19  9:03 PM  Result Value Ref Range   WBC 8.3 4.0 - 10.5 K/uL   RBC 2.77 (L) 3.87 - 5.11 MIL/uL   Hemoglobin 7.8 (L) 12.0 - 15.0 g/dL   HCT 11.5 (L) 72.6 - 20.3 %   MCV 90.3 80.0 - 100.0 fL   MCH 28.2 26.0 - 34.0 pg   MCHC 31.2 30.0 - 36.0 g/dL   RDW 55.9 74.1 - 63.8 %   Platelets 248 150 - 400 K/uL   nRBC 0.0 0.0 - 0.2 %   Neutrophils Relative % 77 %   Neutro Abs 6.4 1.7 - 7.7 K/uL   Lymphocytes Relative 8 %   Lymphs Abs 0.7 0.7 - 4.0 K/uL   Monocytes Relative 8 %   Monocytes Absolute 0.7 0.1 - 1.0 K/uL   Eosinophils Relative 5 %   Eosinophils Absolute 0.4 0.0 - 0.5 K/uL   Basophils Relative 1 %   Basophils Absolute 0.0 0.0 - 0.1 K/uL   Immature Granulocytes 1 %   Abs Immature Granulocytes 0.05 0.00 - 0.07 K/uL  TSH     Status: None   Collection Time: 09/11/19  9:07 PM  Result Value Ref Range   TSH 0.769 0.350 - 4.500 uIU/mL  POC occult blood, ED     Status: None   Collection Time: 09/11/19 10:46 PM  Result Value Ref Range   Fecal Occult Bld NEGATIVE NEGATIVE     Radiological Exams on Admission: DG Chest Port 1 View  Result Date: 09/11/2019 CLINICAL DATA:  Weakness EXAM: PORTABLE CHEST 1 VIEW COMPARISON:  04/19/2019 FINDINGS: The lung volumes are somewhat low. There are hazy ill-defined opacities at the lung bases bilaterally. There is no pneumothorax. No large pleural effusion. The heart size is normal. Aortic calcifications are noted. There is no acute osseous abnormality. IMPRESSION: Low lung volumes with bibasilar atelectasis. Electronically Signed   By: Katherine Mantle M.D.   On: 09/11/2019 21:34   DG Chest [facility] 1 View  Final Result      Consults called:  Needs GI in am  EKG:  Independently reviewed. NSR   Lewie Chamber, MD [facility] Pager: Secure chat via Amion Or pager # : [phone]  If 7PM-7AM, please contact night-coverage [email or web address] Use universal [place] password for that web site. If you do not have the password, please call the hospital operator.  09/11/2019,  11:18 PM

## 2019-09-11 NOTE — ED Triage Notes (Signed)
Pt arrives via [facility] for complaint of failure to thrive. Per family, pt had stroke in December, hasn't had an appetite x1wk, episodes of diarrhea x3 days. Per family, pt progressively getting weaker since stroke, but today could not get out of bed. VSS en route

## 2019-09-11 NOTE — Hospital Course (Addendum)
Patricia Stanley is a 74 yo CF with PMH CVA (04/2019) with residual left sided deficits, CAS s/p R CEA (04/2019), MDD, HTN, HLD who presents to the ER with weakness. After her hospitalization in Dec 2020 she was discharged to rehab. At that time she was also having unintentional weight loss and had undergone a work-up with GI.  Her last EGD/CLN had been about 3.5 years prior and was relatively unremarkable.  She also underwent barium swallow which was also normal.  She was brought in by EMS tonight due to worsening weakness and ongoing diarrhea at home.  Of note, she was also recently seen in the ER on 08/29/2019 with ongoing generalized fatigue and diarrhea still.  History is provided by the patient as well as her daughter who is bedside.  Most of the history is provided by the daughter as the patient is very lethargic but she is alert and oriented. Her daughter states that since January she has been overall declining.  She started having significant diarrhea that has been unresolved despite taking Imodium regularly.  There have been no reports of any blood in her diarrhea.  She is eating solid foods as well as Ensure when her appetite has been poor. She denies any difficulty swallowing nor sensation of food getting stuck, nor any history of vomiting/regurgitation. Daughter is unsure when her last endoscopies were, but note review from December 2020 discharge summary states may have been 3.5 years prior. The biggest complaint from her daughter is that the patient has been having ongoing diarrhea and due to this has continued to grow weaker and weaker.  Now it requires almost 2 people to help her get to the bathroom. The patient also endorses pain and some tingling in her left leg.  She also feels that her balance has been off. Per the daughter, the patient's thought process has remained normal and appropriate.  She has had no confusion or altered mentation.  In addition to her complaints of weakness and  diarrhea, her hemoglobin had also been noted to be very slowly downtrending.  Baseline hemoglobin approximately 11-12 back in December 2020; on work-up on 08/28/2019 hemoglobin was 9.6 g/dL and tonight is now 7.8 g/dL. FOBT in the ER was also negative.  A GI pathogen panel was ordered while patient in the ER. Notable labs also include: Albumin 1.8  Hgb 7.8, Hct 25, WBC 8.3, PLTC 248 TSH 0.769 FOBT neg

## 2019-09-12 ENCOUNTER — Encounter (HOSPITAL_COMMUNITY): Payer: Self-pay | Admitting: Internal Medicine

## 2019-09-12 DIAGNOSIS — D649 Anemia, unspecified: Secondary | ICD-10-CM

## 2019-09-12 DIAGNOSIS — E440 Moderate protein-calorie malnutrition: Secondary | ICD-10-CM | POA: Insufficient documentation

## 2019-09-12 DIAGNOSIS — R627 Adult failure to thrive: Secondary | ICD-10-CM

## 2019-09-12 DIAGNOSIS — I10 Essential (primary) hypertension: Secondary | ICD-10-CM

## 2019-09-12 DIAGNOSIS — R5381 Other malaise: Secondary | ICD-10-CM | POA: Diagnosis present

## 2019-09-12 LAB — GASTROINTESTINAL PANEL BY PCR, STOOL (REPLACES STOOL CULTURE)

## 2019-09-12 LAB — FERRITIN: Ferritin: 188 ng/mL (ref 11–307)

## 2019-09-12 LAB — BASIC METABOLIC PANEL
Anion gap: 10 (ref 5–15)
BUN: 13 mg/dL (ref 8–23)
CO2: 23 mmol/L (ref 22–32)
Calcium: 8.4 mg/dL — ABNORMAL LOW (ref 8.9–10.3)
Chloride: 109 mmol/L (ref 98–111)
Creatinine, Ser: 0.84 mg/dL (ref 0.44–1.00)
GFR calc Af Amer: >60 mL/min (ref >60)
GFR calc non Af Amer: >60 mL/min (ref >60)
Glucose, Bld: 88 mg/dL (ref 70–99)
Potassium: 3.8 mmol/L (ref 3.5–5.1)
Sodium: 142 mmol/L (ref 135–145)

## 2019-09-12 LAB — CBC
HCT: 26.1 % — ABNORMAL LOW (ref 36.0–46.0)
Hemoglobin: 8.2 g/dL — ABNORMAL LOW (ref 12.0–15.0)
MCH: 28.1 pg (ref 26.0–34.0)
MCHC: 31.4 g/dL (ref 30.0–36.0)
MCV: 89.4 fL (ref 80.0–100.0)
Platelets: 260 10*3/uL (ref 150–400)
RBC: 2.92 MIL/uL — ABNORMAL LOW (ref 3.87–5.11)
RDW: 14.8 % (ref 11.5–15.5)
WBC: 9.4 10*3/uL (ref 4.0–10.5)
nRBC: 0 % (ref 0.0–0.2)

## 2019-09-12 LAB — PHOSPHORUS: Phosphorus: 2.9 mg/dL (ref 2.5–4.6)

## 2019-09-12 LAB — HEMOGLOBIN A1C
Hgb A1c MFr Bld: 5.7 % — ABNORMAL HIGH (ref 4.8–5.6)
Mean Plasma Glucose: 116.89 mg/dL

## 2019-09-12 LAB — IRON AND TIBC
Iron: 15 ug/dL — ABNORMAL LOW (ref 28–170)
Saturation Ratios: 9 % — ABNORMAL LOW (ref 10.4–31.8)
TIBC: 169 ug/dL — ABNORMAL LOW (ref 250–450)
UIBC: 154 ug/dL

## 2019-09-12 LAB — MAGNESIUM: Magnesium: 1.6 mg/dL — ABNORMAL LOW (ref 1.7–2.4)

## 2019-09-12 LAB — VITAMIN B12: Vitamin B-12: 515 pg/mL (ref 180–914)

## 2019-09-12 LAB — TROPONIN I (HIGH SENSITIVITY)
Troponin I (High Sensitivity): 8 ng/L (ref <18)
Troponin I (High Sensitivity): 5 ng/L (ref <18)

## 2019-09-12 LAB — TSH: TSH: 0.794 u[IU]/mL (ref 0.350–4.500)

## 2019-09-12 LAB — RESPIRATORY PANEL BY RT PCR (FLU A&B, COVID)
Influenza A by PCR: NEGATIVE
Influenza B by PCR: NEGATIVE
SARS Coronavirus 2 by RT PCR: NEGATIVE

## 2019-09-12 LAB — FOLATE: Folate: 12.9 ng/mL (ref >5.9)

## 2019-09-12 MED ORDER — SODIUM CHLORIDE 0.9 % IV SOLN
510.0000 mg | Freq: Once | INTRAVENOUS | Status: AC
  Administered 2019-09-12: 14:00:00 510 mg via INTRAVENOUS
  Filled 2019-09-12: qty 17

## 2019-09-12 MED ORDER — MAGNESIUM SULFATE 2 GM/50ML IV SOLN: 2.0000 g | Freq: Once | INTRAVENOUS | Status: DC

## 2019-09-12 MED ORDER — FLUOXETINE HCL 20 MG PO CAPS
40.0000 mg | ORAL_CAPSULE | Freq: Every day | ORAL | Status: DC
  Administered 2019-09-12 – 2019-09-15 (×4): 40 mg via ORAL
  Filled 2019-09-12 (×4): qty 2

## 2019-09-12 MED ORDER — ADULT MULTIVITAMIN W/MINERALS CH
1.0000 | ORAL_TABLET | Freq: Every day | ORAL | Status: DC
  Administered 2019-09-13 – 2019-09-16 (×4): 1 via ORAL
  Filled 2019-09-12 (×4): qty 1

## 2019-09-12 MED ORDER — MAGNESIUM SULFATE 2 GM/50ML IV SOLN
2.0000 g | Freq: Once | INTRAVENOUS | Status: AC
  Administered 2019-09-12: 2 g via INTRAVENOUS
  Filled 2019-09-12: qty 50

## 2019-09-12 MED ORDER — PANTOPRAZOLE SODIUM 40 MG PO TBEC
40.0000 mg | DELAYED_RELEASE_TABLET | Freq: Every day | ORAL | Status: DC
  Administered 2019-09-12 – 2019-09-16 (×5): 40 mg via ORAL
  Filled 2019-09-12 (×5): qty 1

## 2019-09-12 MED ORDER — ATENOLOL 50 MG PO TABS
50.0000 mg | ORAL_TABLET | Freq: Every day | ORAL | Status: DC
  Administered 2019-09-12 – 2019-09-16 (×5): 50 mg via ORAL
  Filled 2019-09-12 (×7): qty 1

## 2019-09-12 MED ORDER — SODIUM CHLORIDE 0.9% FLUSH
3.0000 mL | Freq: Two times a day (BID) | INTRAVENOUS | Status: DC
  Administered 2019-09-12 – 2019-09-16 (×7): 3 mL via INTRAVENOUS

## 2019-09-12 MED ORDER — CLONAZEPAM 1 MG PO TABS
1.0000 mg | ORAL_TABLET | Freq: Two times a day (BID) | ORAL | Status: DC | PRN
  Administered 2019-09-13 – 2019-09-15 (×3): 1 mg via ORAL
  Filled 2019-09-12 (×3): qty 1

## 2019-09-12 MED ORDER — ASPIRIN EC 325 MG PO TBEC
325.0000 mg | DELAYED_RELEASE_TABLET | Freq: Every day | ORAL | Status: DC
  Administered 2019-09-12 – 2019-09-16 (×5): 325 mg via ORAL
  Filled 2019-09-12 (×6): qty 1

## 2019-09-12 MED ORDER — ATORVASTATIN CALCIUM 80 MG PO TABS
80.0000 mg | ORAL_TABLET | Freq: Every evening | ORAL | Status: DC
  Administered 2019-09-12 – 2019-09-16 (×5): 80 mg via ORAL
  Filled 2019-09-12 (×5): qty 1

## 2019-09-12 NOTE — Evaluation (Signed)
Physical Therapy Evaluation Patient Details Name: Patricia Stanley MRN: [id number] DOB: [birth year] Today's Date: 09/12/2019   History of Present Illness  Patricia Stanley is a 74 yo CF with PMH CVA (04/2019) with residual left sided deficits, CAS s/p R CEA (04/2019), MDD, HTN, HLD who presents to the ER with weakness and diarrhea.  Clinical Impression  Pt admitted with above diagnosis. Pt lethargic but easily aroused on eval, answers questions but does not provide any additional information. Husband relays that it has become harder and harder to motivate pt to get out of bed and participate in anything. On eval, pt with generalized weakness. LLE noted to be weaker than R from previous stroke but able to bear wt on that side in standing and needs vc's to lift LLE with gait. Pt ambulated 10' with RW and min A with L lean, was visibly fatigued after this short walk. Recommending outpt PT as pt hard started saying she didn't want to work with PT coming to her home and hoping that having a reason to get out of her house will help her to be more engaged in daily life. Husband in agreement with this as he reports that she seems to have better days when she has to leave for MD appts, etc.  Pt currently with functional limitations due to the deficits listed below (see PT Problem List). Pt will benefit from skilled PT to increase their independence and safety with mobility to allow discharge to the venue listed below.       Follow Up Recommendations Outpatient PT    Equipment Recommendations  None recommended by PT    Recommendations for Other Services       Precautions / Restrictions Precautions Precautions: Fall Restrictions Weight Bearing Restrictions: No      Mobility  Bed Mobility Overal bed mobility: Needs Assistance Bed Mobility: Supine to Sit     Supine to sit: Min assist     General bed mobility comments: min A to initiate mvmt. Pt slides LE's to EOB but min A to complete task and to get  hips fully to EOB. Pt does put L hand down on bed to help push self fwd  Transfers Overall transfer level: Needs assistance Equipment used: Rolling walker (2 wheeled) Transfers: Sit to/from [employer] Sit to Stand: Min assist Stand pivot transfers: Min assist       General transfer comment: performed SPT to recliner without AD, min A with therapist directly in front of pt. Used RW for other transfers, min A for power up  Ambulation/Gait Ambulation/Gait assistance: Editor, commissioning (Feet): 10 Feet(5' fwd, 5' back) Assistive device: Rolling walker (2 wheeled) Gait Pattern/deviations: Decreased dorsiflexion - left;Decreased step length - left Gait velocity: decreased Gait velocity interpretation: <1.31 ft/sec, indicative of household ambulator General Gait Details: pt visibly fatigued after 10' of gait. Min support on L side with ambulation as pt loses balance to L. vc's for picking up L foot  Stairs            Wheelchair Mobility    Modified Rankin (Stroke Patients Only)       Balance Overall balance assessment: Needs assistance Sitting-balance support: Bilateral upper extremity supported;Feet supported Sitting balance-Leahy Scale: Fair     Standing balance support: Bilateral upper extremity supported Standing balance-Leahy Scale: Poor Standing balance comment: requires UE support and external assist  Pertinent Vitals/Pain Pain Assessment: No/denies pain    Home Living Family/patient expects to be discharged to:: Private residence Living Arrangements: Spouse/significant other;Children Available Help at Discharge: Family;Available 24 hours/day Type of Home: House Home Access: Stairs to enter Entrance Stairs-Rails: Chemical engineer of Steps: 6 Home Layout: One level Home Equipment: Environmental consultant - 2 wheels;Bedside commode;Shower seat;Grab bars - tub/shower;Hand held shower head Additional  Comments: lives with husband and daughter    Prior Function Level of Independence: Needs assistance   Gait / Transfers Assistance Needed: ambulates with RW and has needed additional support last month  ADL's / Homemaking Assistance Needed: family assist pt into shower and on the chair,   Comments: spouse reports pt's functional decline but one of biggest challenge is lack of motivation to [employer] or participate in anything     Hand Dominance   Dominant Hand: Left    Extremity/Trunk Assessment   Upper Extremity Assessment Upper Extremity Assessment: Generalized weakness;Defer to OT evaluation    Lower Extremity Assessment Lower Extremity Assessment: Generalized weakness;LLE deficits/detail LLE Deficits / Details: drags L foot with ambulation but pt able to slide L foot off bed in supine LLE Sensation: decreased proprioception LLE Coordination: decreased gross motor    Cervical / Trunk Assessment Cervical / Trunk Assessment: Kyphotic  Communication   Communication: Expressive difficulties  Cognition Arousal/Alertness: Lethargic Behavior During Therapy: Flat affect Overall Cognitive Status: Impaired/Different from baseline Area of Impairment: Memory;Problem solving;Following commands                     Memory: Decreased short-term memory Following Commands: Follows one step commands consistently;Follows one step commands with increased time     Problem Solving: Slow processing;Decreased initiation;Requires verbal cues General Comments: pt lethargic but arouses to voice and will answer questions with a delay. Closes eyes as soon as conversation ends. Does not offer any additional info. Husband reports that her function has decreased to the point that she does not want to get out of the bed      General Comments General comments (skin integrity, edema, etc.): VSS    Exercises     Assessment/Plan    PT Assessment Patient needs continued PT services  PT  Problem List Decreased strength;Decreased activity tolerance;Decreased balance;Decreased mobility;Decreased cognition;Decreased coordination       PT Treatment Interventions DME instruction;Gait training;Stair training;Functional mobility training;Therapeutic activities;Therapeutic exercise;Balance training;Patient/family education;Cognitive remediation    PT Goals (Current goals can be found in the Care Plan section)  Acute Rehab PT Goals Patient Stated Goal: none stated by pt. Husband would like to figure out why patient's energy level and motivation in so low PT Goal Formulation: With patient/family Time For Goal Achievement: 09/26/19 Potential to Achieve Goals: Fair    Frequency Min 3X/week   Barriers to discharge        Co-evaluation               AM-PAC PT "6 Clicks" Mobility  Outcome Measure Help needed turning from your back to your side while in a flat bed without using bedrails?: A Little Help needed moving from lying on your back to sitting on the side of a flat bed without using bedrails?: A Little Help needed moving to and from a bed to a chair (including a wheelchair)?: A Little Help needed standing up from a chair using your arms (e.g., wheelchair or bedside chair)?: A Little Help needed to walk in hospital room?: A Lot Help needed climbing 3-5 steps with a railing? :  Total 6 Click Score: 15    End of Session Equipment Utilized During Treatment: Gait belt Activity Tolerance: Patient limited by lethargy Patient left: in chair;with call bell/phone within reach;with chair alarm set;with family/visitor present Nurse Communication: Mobility status PT Visit Diagnosis: Unsteadiness on feet (R26.81);Muscle weakness (generalized) (M62.81);Difficulty in walking, not elsewhere classified (R26.2)    Time: 1132-1204 PT Time Calculation (min) (ACUTE ONLY): 32 min   Charges:   PT Evaluation $PT Eval Moderate Complexity: 1 Mod PT Treatments $Gait Training: 8-22  mins        Lyanne Co, PT  Acute Rehab Services  Pager [phone] Office [phone]   Lawana Chambers Patrece Tallie 09/12/2019, 1:16 PM

## 2019-09-12 NOTE — Consult Note (Signed)
[facility] Consultation Note  Referring Provider: [facility] Primary Care Physician:  Marden Noble, MD Primary Gastroenterologist:  None (had seen Dr. Laural Benes 2017 prior to his retirement)  Reason for Consultation:  Diarrhea, weakness, weight loss.  HPI: Patricia Stanley is a 74 y.o. female whom we've been asked to see for above reasons.  I spoke with patient's daughter, Tyler Pita, to obtain information, as patient's own verbalized history is very scant and sporadic.  Patient has several year history of failure to thrive, diarrhea, weight loss.  This became worse after she had a stroke and carotid endarterectomy about 4 months ago.     Past Medical History:  Diagnosis Date  . Closed fracture of coccyx (HCC) 2015   AFTER FALL  . Depression   . GERD (gastroesophageal reflux disease)   . Headache    MIGRAINES OCCASIONAL  . History of kidney stones    HAS NOW  . Hypertension     Past Surgical History:  Procedure Laterality Date  . ABDOMINAL HYSTERECTOMY     1 OVARY LEFT  . APPENDECTOMY  AGE 37   DONE WITH OVARY REMOVAL  . COLONOSCOPY WITH PROPOFOL N/A 12/03/2015   Procedure: COLONOSCOPY WITH PROPOFOL;  Surgeon: Charolett Bumpers, MD;  Location: [facility];  Service: Endoscopy;  Laterality: N/A;  . ENDARTERECTOMY Right 04/21/2019   Procedure: ENDARTERECTOMY CAROTID;  Surgeon: Cephus Shelling, MD;  Location: [facility] OR;  Service: Vascular;  Laterality: Right;  . ESOPHAGOGASTRODUODENOSCOPY (EGD) WITH PROPOFOL N/A 12/03/2015   Procedure: ESOPHAGOGASTRODUODENOSCOPY (EGD) WITH PROPOFOL;  Surgeon: Charolett Bumpers, MD;  Location: [facility];  Service: Endoscopy;  Laterality: N/A;  . TONSILLECTOMY  AGE 38   AND ADENOIDS REMOVED    Prior to Admission medications   Medication Sig Start Date End Date Taking? Authorizing Provider  FLUoxetine (PROZAC) 40 MG capsule Take 40 mg by mouth in the morning.   Yes [provider]  hydrALAZINE (APRESOLINE) 25 MG tablet  Take 1 tablet (25 mg total) by mouth every 8 (eight) hours. 05/09/19  Yes Angiulli, Mcarthur Rossetti, PA-C  Probiotic CAPS Take 1 capsule by mouth in the morning and at bedtime.   Yes [provider]  aspirin 325 MG tablet Take 1 tablet (325 mg total) by mouth daily. 04/26/19   Swayze, Ava, DO  atenolol (TENORMIN) 50 MG tablet Take 1 tablet (50 mg total) by mouth every evening. 05/09/19   Angiulli, Mcarthur Rossetti, PA-C  atorvastatin (LIPITOR) 80 MG tablet Take 1 tablet (80 mg total) by mouth daily at 6 PM. 05/09/19   Angiulli, Mcarthur Rossetti, PA-C  clonazePAM (KLONOPIN) 1 MG tablet 1 tab every a.m. and 2 tabs nightly 05/09/19   Angiulli, Mcarthur Rossetti, PA-C  docusate sodium (COLACE) 100 MG capsule Take 1 capsule (100 mg total) by mouth daily. 05/20/19   Raulkar, Drema Pry, MD  lisinopril (ZESTRIL) 10 MG tablet Take 1 tablet (10 mg total) by mouth daily. 05/09/19   Angiulli, Mcarthur Rossetti, PA-C  methocarbamol (ROBAXIN) 500 MG tablet Take 1 tablet (500 mg total) by mouth daily. 05/10/19   Angiulli, Mcarthur Rossetti, PA-C  Multiple Vitamin (MULTIVITAMIN WITH MINERALS) TABS tablet Take 1 tablet by mouth daily. 05/10/19   Angiulli, Mcarthur Rossetti, PA-C  pantoprazole (PROTONIX) 40 MG tablet Take 1 tablet (40 mg total) by mouth 2 (two) times daily before a meal. 05/09/19   Angiulli, Mcarthur Rossetti, PA-C  potassium chloride (KLOR-CON) 10 MEQ tablet Take 2 tablets (20 mEq total) by mouth daily for 10 days.  Patient not taking: Reported on 09/11/2019 08/29/19 09/11/19  Nira Conn, MD  traZODone (DESYREL) 50 MG tablet Take 1 tablet (50 mg total) by mouth at bedtime. 05/09/19   Angiulli, Mcarthur Rossetti, PA-C    Current Facility-Administered Medications  Medication Dose Route Frequency Provider Last Rate Last Admin  . aspirin EC tablet 325 mg  325 mg Oral Daily Lewie Chamber, MD      . atenolol (TENORMIN) tablet 50 mg  50 mg Oral Daily Lewie Chamber, MD   50 mg at 09/12/19 1002  . atorvastatin (LIPITOR) tablet 80 mg  80 mg Oral QPM Lewie Chamber, MD      .  ferumoxytol [facility]) 510 mg in sodium chloride 0.9 % 100 mL IVPB  510 mg Intravenous Once Black, Karen M, NP      . FLUoxetine (PROZAC) capsule 40 mg  40 mg Oral Daily Lewie Chamber, MD   40 mg at 09/12/19 1002  . lactated ringers infusion   Intravenous Continuous Lewie Chamber, MD 75 mL/hr at 09/12/19 0142 New Bag/Given (Non-Interop) at 09/12/19 0142  . multivitamin with minerals tablet 1 tablet  1 tablet Oral Daily Lewie Chamber, MD      . pantoprazole (PROTONIX) EC tablet 40 mg  40 mg Oral Daily Lewie Chamber, MD   40 mg at 09/12/19 1002  . sodium chloride flush (NS) 0.9 % injection 3 mL  3 mL Intravenous Q12H Lewie Chamber, MD        Allergies as of 09/11/2019 - Review Complete 09/11/2019  Allergen Reaction Noted  . Paroxetine Other (See Comments) 11/20/2015    Family History  Problem Relation Age of Onset  . Hypertension Mother   . Hyperlipidemia Mother   . Hypertension Father   . Hyperlipidemia Father     Social History   Socioeconomic History  . Marital status: Married    Spouse name: Not on file  . Number of children: Not on file  . Years of education: Not on file  . Highest education level: Not on file  Occupational History  . Not on file  Tobacco Use  . Smoking status: Former Smoker    Quit date: 05/06/1995    Years since quitting: 24.3  . Smokeless tobacco: Never Used  Substance and Sexual Activity  . Alcohol use: No  . Drug use: No  . Sexual activity: Not on file  Other Topics Concern  . Not on file  Social History Narrative  . Not on file   Social Determinants of Health   Financial Resource Strain:   . Difficulty of Paying Living Expenses:   Food Insecurity:   . Worried About Programme researcher, broadcasting/film/video in the Last Year:   . Barista in the Last Year:   Transportation Needs:   . Freight forwarder (Medical):   [name] Lack of Transportation (Non-Medical):   Physical Activity:   . Days of Exercise per Week:   . Minutes of Exercise per Session:    Stress:   . Feeling of Stress :   Social Connections:   . Frequency of Communication with Friends and Family:   . Frequency of Social Gatherings with Friends and Family:   . Attends Religious Services:   . Active Member of Clubs or Organizations:   . Attends Banker Meetings:   [name] Marital Status:   Intimate Partner Violence:   . Fear of Current or Ex-Partner:   . Emotionally Abused:   [name] Physically Abused:   .  Sexually Abused:     Review of Systems: As per HPI, all others negative  Physical Exam: Vital signs in last 24 hours: Temp:  [98.1 F (36.7 C)-99 F (37.2 C)] 99 F (37.2 C) (05/10 1000) Pulse Rate:  [63-85] 85 (05/10 1000) Resp:  [12-16] 16 (05/10 1000) BP: (112-128)/(52-65) 116/65 (05/10 1000) SpO2:  [96 %-99 %] 96 % (05/10 1000) Weight:  [40.5 kg-43.9 kg] 40.5 kg (05/10 0226) Last BM Date: 09/12/19 General:   Alert,  Profoundly cachectic but NAD Head:  Normocephalic and atraumatic. Eyes:  Sclera clear, no icterus.   Conjunctiva pink. Ears:  Normal auditory acuity. Nose:  No deformity, discharge,  or lesions. Mouth:  No deformity or lesions.  Oropharynx pink & moist. Neck:  Supple; no masses or thyromegaly. Lungs:  Clear throughout to auscultation.   No wheezes, crackles, or rhonchi. No acute distress. Heart:  Regular rate and rhythm; no murmurs, clicks, rubs,  or gallops. Abdomen:  Soft, nontender and nondistended. No masses, hepatosplenomegaly or hernias noted. Normal bowel sounds, without guarding, and without rebound.     Msk:  Symmetrical without gross deformities but profound muscular atrophy. Normal posture. Pulses:  Normal pulses noted. Extremities:  Without clubbing or edema. Neurologic:  Alert and  oriented x4;  grossly normal neurologically. Skin:  Scattered ecchymoses, otherwise Intact without significant lesions or rashes. Cervical Nodes:  No significant cervical adenopathy. Psych:  Alert and cooperative. Normal mood and affect.   Lab  Results: Recent Labs    09/11/19 2103 09/12/19 0347  WBC 8.3 9.4  HGB 7.8* 8.2*  HCT 25.0* 26.1*  PLT 248 260   BMET Recent Labs    09/11/19 2103 09/12/19 0347  NA 140 142  K 4.1 3.8  CL 107 109  CO2 23 23  GLUCOSE 100* 88  BUN 15 13  CREATININE 0.96 0.84  CALCIUM 8.3* 8.4*   LFT Recent Labs    09/11/19 2103  PROT 5.3*  ALBUMIN 1.8*  AST 23  ALT 15  ALKPHOS 65  BILITOT 0.7   PT/INR No results for input(s): LABPROT, INR in the last 72 hours.  Studies/Results: DG Chest Port 1 View  Result Date: 09/11/2019 CLINICAL DATA:  Weakness EXAM: PORTABLE CHEST 1 VIEW COMPARISON:  04/19/2019 FINDINGS: The lung volumes are somewhat low. There are hazy ill-defined opacities at the lung bases bilaterally. There is no pneumothorax. No large pleural effusion. The heart size is normal. Aortic calcifications are noted. There is no acute osseous abnormality. IMPRESSION: Low lung volumes with bibasilar atelectasis. Electronically Signed   By: Katherine Mantle M.D.   On: 09/11/2019 21:34    Impression:  1.  Diarrhea, acute on chronic.  Ongoing problem for several years.  Has had endoscopy (gastric bx negative H. Pylori, duodenal biopsies negative celiac) and colonoscopy (random colon biopsies negative for microscopic colitis) in 2017 for the exact same type of symptoms.  Imaging studies of late show constipation, raising concern for possible overflow diarrhea from constipation.   2.  Poor nutrition and failure to thrive.  Ongoing problem for several years.  Certainly could have organic medical process, but patient also has significant anxiety/depression which I feel is undoubtedly contributing.  I have looked at her outpatient chart, and patient's weight has fluctuated from about 92 lbs (patient around 95 lbs now) to about 122 lbs cyclically over the past 10+ years. 3.  Anemia.  This does appear to be change over the past few years. 4.  Abnormal CT (esophageal thickening).  Could be GERD,  could be reactive changes stemming from CEA surgery done soon before her CT 04/2019 was done; follow up esophagram did not show esophageal leak or any esophageal stricture/mass.  Plan:  1.   For reasons of diarrhea, I do not think patient needs repeat endoscopy or colonoscopy.  For reasons of anemia and abnormal prior CT scan, might consider endoscopy +/- colonoscopy down-the-road, but at this time she is far too weak and frail to tolerate these procedures (and any potential complication such as perforation would highly likely be fatal) and even should she have cancer (which I doubt, and has not been suggested on imaging few months ago) she would not with her current low functional status be a candidate for any type of surgery or intervention. 2.  Will check abdominal xray; if large stool burden is seen, she might be candidate for bowel regimen to see if this helps her (overflow) diarrhea and nausea and poor appetite. 3.  [facility] will follow.   LOS: 1 day   Nyonna Hargrove M  09/12/2019, 11:21 AM  Cell [phone] If no answer or after 5 PM call [phone]

## 2019-09-12 NOTE — Progress Notes (Signed)
Progress Note    Patricia Stanley  WTU:[id number] DOB: 01-13-1946  DOA: 09/11/2019 PCP: Marden Noble, MD    Brief Narrative:    Medical records reviewed and are as summarized below:  Patricia Stanley is an 74 y.o. female with a past medical history that includes stroke in December 2020 with residual left-sided deficits, hypertension, hyperlipidemia, CAS s/p R CEA December 2020 admitted May 9 chief complaint persistent diarrhea, worsening weakness anemia and severe deconditioning.   Assessment/Plan:   Principal Problem:   Diarrhea Active Problems:   Anemia   HTN (hypertension)   Physical deconditioning   Major depression in partial remission (HCC)   #1.  Diarrhea.  Etiology unclear.  Patient denied any episodes during the night.  Patient had a colonoscopy 2017 that was "normal".  Panel has not been sent.  Of note patient hospitalized December 2020.  During that hospitalization had an abnormal CT was evaluated by gastroenterology.  Recommendation was outpatient follow-up for EGD and colonoscopy.  Patient did not follow-up.  FOBT negative.Concern for malabsorption.  -gi consult -gi pathogen panel  -monitor intake and output  #2.  Anemia. Normocytic.  Hemoglobin 8.2 down from 10.1 4 months ago. Iron 12.  B12, folate, ferritin within the limits of normal.  FOBT negative. -IV iron -GI consult -Monitor -transfuse if hemoglobin 7 or less.  #3.  Malnutrition/physical deconditioning.  This has been going on since January when she had her stroke.  She spent several weeks in inpatient rehab.  Appetite waxes and wanes in the setting of #1. -Iron studies as noted above -PT/OT -Nutritional consult -MVI  #4.  History of CVA.  Reportedly has some left sided residual deficits. -Continue aspirin -PT/OT consult as noted above  #5.  Hypertension.  Fair control.  Home medications include atenolol -Continue home meds  #6.  Major depressive disorder.  Appears stable at baseline.  Somewhat  flat -Continue home Prozac   Family Communication/Anticipated D/C date and plan/Code Status   DVT prophylaxis: scd. Code Status: Full Code.  Family Communication: patient at bedside Disposition Plan: Status is: Inpatient  Remains inpatient appropriate because:Inpatient level of care appropriate due to severity of illness   Dispo: The patient is from: Home              Anticipated d/c is to: Home              Anticipated d/c date is: 2 days              Patient currently is not medically stable to d/c.          Medical Consultants:    [facility]   Anti-Infectives:    None  Subjective:   Reports sleeping fairly well when she went to sleep.  Complains of intermittent abdominal pain.  No episodes of diarrhea during the night.  Objective:    Vitals:   09/12/19 0015 09/12/19 0141 09/12/19 0226 09/12/19 1000  BP: (!) 121/56 (!) 120/56 (!) 123/59 116/65  Pulse: 68 70 81 85  Resp: 15 12 16 16   Temp:  98.3 F (36.8 C) 98.4 F (36.9 C) 99 F (37.2 C)  TempSrc:  Oral Oral Oral  SpO2: 98% 99% 97% 96%  Weight:   40.5 kg   Height:        Intake/Output Summary (Last 24 hours) at 09/12/2019 1113 Last data filed at 09/11/2019 2205 Gross per 24 hour  Intake 1150 ml  Output -  Net 1150 ml  Filed Weights   09/11/19 2047 09/12/19 0226  Weight: 43.9 kg 40.5 kg    Exam: General: Thin frail chronically ill-appearing somewhat pale/gray CV: Regular rate and rhythm no murmur gallop or rub no lower extremity edema Respiratory: No increased work of breathing breath sounds are distant but clear I hear no wheezes no rhonchi Abdomen soft nondistended sluggish bowel sounds mild tenderness to palpation particularly in the lower quadrants.  No guarding or rebounding Musculoskeletal: Joints without swelling/erythema moves all extremities spontaneously full range of motion Neuro: Awake alert oriented x3 speech clear facial symmetry somewhat flat affect  Data Reviewed:   I have  personally reviewed following labs and imaging studies:  Labs: Labs show the following:   Basic Metabolic Panel: Recent Labs  Lab 09/11/19 2103 09/12/19 0347  NA 140 142  K 4.1 3.8  CL 107 109  CO2 23 23  GLUCOSE 100* 88  BUN 15 13  CREATININE 0.96 0.84  CALCIUM 8.3* 8.4*  MG  --  1.6*  PHOS  --  2.9   GFR Estimated Creatinine Clearance: 38.1 mL/min (by C-G formula based on SCr of 0.84 mg/dL). Liver Function Tests: Recent Labs  Lab 09/11/19 2103  AST 23  ALT 15  ALKPHOS 65  BILITOT 0.7  PROT 5.3*  ALBUMIN 1.8*   No results for input(s): LIPASE, AMYLASE in the last 168 hours. No results for input(s): AMMONIA in the last 168 hours. Coagulation profile No results for input(s): INR, PROTIME in the last 168 hours.  CBC: Recent Labs  Lab 09/11/19 2103 09/12/19 0347  WBC 8.3 9.4  NEUTROABS 6.4  --   HGB 7.8* 8.2*  HCT 25.0* 26.1*  MCV 90.3 89.4  PLT 248 260   Cardiac Enzymes: No results for input(s): CKTOTAL, CKMB, CKMBINDEX, TROPONINI in the last 168 hours. BNP (last 3 results) No results for input(s): PROBNP in the last 8760 hours. CBG: No results for input(s): GLUCAP in the last 168 hours. D-Dimer: No results for input(s): DDIMER in the last 72 hours. Hgb A1c: Recent Labs    09/12/19 0347  HGBA1C 5.7*   Lipid Profile: No results for input(s): CHOL, HDL, LDLCALC, TRIG, CHOLHDL, LDLDIRECT in the last 72 hours. Thyroid function studies: Recent Labs    09/12/19 0347  TSH 0.794   Anemia work up: Recent Labs    09/12/19 0347  VITAMINB12 515  FOLATE 12.9  FERRITIN 188  TIBC 169*  IRON 15*   Sepsis Labs: Recent Labs  Lab 09/11/19 2103 09/12/19 0347  WBC 8.3 9.4    Microbiology Recent Results (from the past 240 hour(s))  Respiratory Panel by RT PCR (Flu A&B, Covid) - Nasopharyngeal Swab     Status: None   Collection Time: 09/11/19 11:52 PM   Specimen: Nasopharyngeal Swab  Result Value Ref Range Status   SARS Coronavirus 2 by RT PCR  NEGATIVE NEGATIVE Final    Comment: (NOTE) SARS-CoV-2 target nucleic acids are NOT DETECTED. The SARS-CoV-2 RNA is generally detectable in upper respiratoy specimens during the acute phase of infection. The lowest concentration of SARS-CoV-2 viral copies this assay can detect is 131 copies/mL. A negative result does not preclude SARS-Cov-2 infection and should not be used as the sole basis for treatment or other patient management decisions. A negative result may occur with  improper specimen collection/handling, submission of specimen other than nasopharyngeal swab, presence of viral mutation(s) within the areas targeted by this assay, and inadequate number of viral copies (<131 copies/mL). A negative result must  be combined with clinical observations, patient history, and epidemiological information. The expected result is Negative. Fact Sheet for Patients:  https://www.moore.com/ Fact Sheet for Healthcare Providers:  https://www.young.biz/ This test is not yet ap proved or cleared by the [place] FDA and  has been authorized for detection and/or diagnosis of SARS-CoV-2 by FDA under an Emergency Use Authorization (EUA). This EUA will remain  in effect (meaning this test can be used) for the duration of the COVID-19 declaration under Section 564(b)(1) of the Act, 21 U.S.C. section 360bbb-3(b)(1), unless the authorization is terminated or revoked sooner.    Influenza A by PCR NEGATIVE NEGATIVE Final   Influenza B by PCR NEGATIVE NEGATIVE Final    Comment: (NOTE) The Xpert Xpress SARS-CoV-2/FLU/RSV assay is intended as an aid in  the diagnosis of influenza from Nasopharyngeal swab specimens and  should not be used as a sole basis for treatment. Nasal washings and  aspirates are unacceptable for Xpert Xpress SARS-CoV-2/FLU/RSV  testing. Fact Sheet for Patients: https://www.moore.com/ Fact Sheet for Healthcare Providers:  https://www.young.biz/ This test is not yet approved or cleared by the [place] FDA and  has been authorized for detection and/or diagnosis of SARS-CoV-2 by  FDA under an Emergency Use Authorization (EUA). This EUA will remain  in effect (meaning this test can be used) for the duration of the  Covid-19 declaration under Section 564(b)(1) of the Act, 21  U.S.C. section 360bbb-3(b)(1), unless the authorization is  terminated or revoked. Performed at [facility], [address] [place], [place] [address]     Procedures and diagnostic studies:  DG Chest Port 1 View  Result Date: 09/11/2019 CLINICAL DATA:  Weakness EXAM: PORTABLE CHEST 1 VIEW COMPARISON:  04/19/2019 FINDINGS: The lung volumes are somewhat low. There are hazy ill-defined opacities at the lung bases bilaterally. There is no pneumothorax. No large pleural effusion. The heart size is normal. Aortic calcifications are noted. There is no acute osseous abnormality. IMPRESSION: Low lung volumes with bibasilar atelectasis. Electronically Signed   By: Katherine Mantle M.D.   On: 09/11/2019 21:34    Medications:   . aspirin EC  325 mg Oral Daily  . atenolol  50 mg Oral Daily  . atorvastatin  80 mg Oral QPM  . FLUoxetine  40 mg Oral Daily  . multivitamin with minerals  1 tablet Oral Daily  . pantoprazole  40 mg Oral Daily  . sodium chloride flush  3 mL Intravenous Q12H   Continuous Infusions: . ferumoxytol    . lactated ringers 75 mL/hr at 09/12/19 0142     LOS: 1 day   Gwenyth Bender NP [facility]   How to contact the [facility] Attending or Consulting provider 7A - 7P or covering provider during after hours 7P -7A, for this patient?  1. Check the care team in [facility] and look for a) attending/consulting [facility] provider listed and b) the [facility] team listed 2. Log into [email or web address] and use [place]'s universal password to access. If you do not have the password, please contact the hospital  operator. 3. Locate the [facility] provider you are looking for under [facility] and page to a number that you can be directly reached. 4. If you still have difficulty reaching the provider, please page the [facility] (Director on Call) for the Hospitalists listed on amion for assistance.  09/12/2019, 11:13 AM

## 2019-09-12 NOTE — Progress Notes (Signed)
Initial Nutrition Assessment  DOCUMENTATION CODES:   Non-severe (moderate) malnutrition in context of chronic illness  INTERVENTION:   -RD will follow for diet advancement and adjust supplement regimen as appropriate  NUTRITION DIAGNOSIS:   Moderate Malnutrition related to chronic illness(CVA) as evidenced by mild fat depletion, moderate fat depletion, mild muscle depletion, moderate muscle depletion, energy intake < or equal to 75% for > or equal to 1 month.  GOAL:   Patient will meet greater than or equal to 90% of their needs  MONITOR:   PO intake, Supplement acceptance, Diet advancement, Labs, Weight trends, Skin, I & O's  REASON FOR ASSESSMENT:   Consult Assessment of nutrition requirement/status  ASSESSMENT:   Patricia Stanley is a 74 yo CF with PMH CVA (04/2019) with residual left sided deficits, CAS s/p R CEA (04/2019), MDD, HTN, HLD who presents to the ER with weakness.  Pt admitted with diarrhea.   Reviewed I/O's: +1.1 L x 24 hours  Pt extremely lethargic at time of visit and did not arouse to voice or touch.   Spoke with pt husband, who reports pt has experienced a general decline in health over the past 5-6 months, since pt had a stroke. He explains that she was hospitalized at [facility] and [facility] between 04/2019-05/2019 for stroke. Per husband, pt had a pretty good appetite after discharge and during prior hospitalization, consuming at least 2 meals per day. Pt husband shares that pt had constipation issues prior to stroke, but now has had significant diarrhea for the past few months. He does not think her intake (type or frequency of foods) is contributing to the diarrhea. He shares that her meals have been less balanced (ex sausage croissant from [employer] and 2-3 Ensure supplements daily), as they have liberalized her diet due to poor oral intake and will offer pt whatever she is willing to eat. Over the past week, husband shares that MD prescribed her imodium and another OTC  medication, but continues to have frequent, pudding thick stools.   Pt's mobility and motivation have significantly decreased; per pt husband, pt typically wants to stay in bed and not work with home health therapies. He shares he UBW is around 105# prior to stroke and she has now gradually gotten down to around 82#. Per wt hx, pt has experienced a 11% wt loss over the past 6 months, which is significant for time frame.   Pt husband understanding of rationale for NPO order. He has no further questions at this time, but expressed appreciation for RD visit.   Labs reviewed.   NUTRITION - FOCUSED PHYSICAL EXAM:    Most Recent Value  Orbital Region  Mild depletion  Upper Arm Region  Moderate depletion  Thoracic and Lumbar Region  No depletion  Buccal Region  No depletion  Temple Region  Mild depletion  Clavicle Bone Region  Mild depletion  Clavicle and Acromion Bone Region  Mild depletion  Scapular Bone Region  Mild depletion  Dorsal Hand  Mild depletion  Patellar Region  Moderate depletion  Anterior Thigh Region  Moderate depletion  Posterior Calf Region  Moderate depletion  Edema (RD Assessment)  Mild  Hair  Reviewed  Eyes  Reviewed  Mouth  Reviewed  Skin  Reviewed  Nails  Reviewed       Diet Order:   Diet Order            Diet NPO time specified Except for: Sips with Meds, Ice Chips  Diet effective now  EDUCATION NEEDS:   No education needs have been identified at this time  Skin:  Skin Assessment: Reviewed RN Assessment  Last BM:  09/12/19  Height:   Ht Readings from Last 1 Encounters:  09/11/19 5\' 1"  (1.549 m)    Weight:   Wt Readings from Last 1 Encounters:  09/12/19 40.5 kg    Ideal Body Weight:  47.7 kg  BMI:  Body mass index is 16.87 kg/m.  Estimated Nutritional Needs:   Kcal:  1250-1450  Protein:  65-80 grams  Fluid:  > 1.2 L    11/12/19, RD, LDN, CDCES Registered Dietitian II Certified Diabetes Care and Education  Specialist Please refer to [facility] for RD and/or RD on-call/weekend/after hours pager

## 2019-09-13 ENCOUNTER — Inpatient Hospital Stay (HOSPITAL_COMMUNITY): Payer: Medicare HMO

## 2019-09-13 ENCOUNTER — Encounter (HOSPITAL_COMMUNITY): Payer: Self-pay | Admitting: Internal Medicine

## 2019-09-13 DIAGNOSIS — E440 Moderate protein-calorie malnutrition: Secondary | ICD-10-CM

## 2019-09-13 LAB — C DIFFICILE QUICK SCREEN W PCR REFLEX
C Diff antigen: NEGATIVE
C Diff interpretation: NOT DETECTED
C Diff toxin: NEGATIVE

## 2019-09-13 LAB — VITAMIN D 25 HYDROXY (VIT D DEFICIENCY, FRACTURES): Vit D, 25-Hydroxy: 71.5 ng/mL (ref 30–100)

## 2019-09-13 MED ORDER — TRAMADOL HCL 50 MG PO TABS
50.0000 mg | ORAL_TABLET | Freq: Four times a day (QID) | ORAL | Status: DC | PRN
  Administered 2019-09-14 – 2019-09-16 (×4): 50 mg via ORAL
  Filled 2019-09-13 (×4): qty 1

## 2019-09-13 MED ORDER — LISINOPRIL 10 MG PO TABS
10.0000 mg | ORAL_TABLET | Freq: Every day | ORAL | Status: DC
  Administered 2019-09-13 – 2019-09-16 (×4): 10 mg via ORAL
  Filled 2019-09-13 (×4): qty 1

## 2019-09-13 MED ORDER — ENSURE ENLIVE PO LIQD
237.0000 mL | Freq: Two times a day (BID) | ORAL | Status: DC
  Administered 2019-09-14 – 2019-09-16 (×5): 237 mL via ORAL

## 2019-09-13 MED ORDER — ACETAMINOPHEN 325 MG PO TABS
650.0000 mg | ORAL_TABLET | Freq: Four times a day (QID) | ORAL | Status: DC | PRN
  Administered 2019-09-13 – 2019-09-14 (×3): 650 mg via ORAL
  Filled 2019-09-13 (×3): qty 2

## 2019-09-13 NOTE — Progress Notes (Addendum)
Progress Note    Patricia Stanley  IWL:[id number] DOB: 1946/03/22  DOA: 09/11/2019 PCP: Josetta Huddle, MD    Brief Narrative:    Medical records reviewed and are as summarized below:  Patricia Stanley is an 74 y.o. female with a past medical history significant for stroke in December 2020 with residual left-sided deficits, hyperlipidemia, hypertension, CAS status post right CEA December 2020 admitted May 9 with chief complaint of persistent diarrhea, worsening weakness, anemia and severe deconditioning.  Chart review indicates of these issues anemia is new other ones have been ongoing just worsening since her stroke in December.  Assessment/Plan:   Principal Problem:   Diarrhea Active Problems:   Anemia   HTN (hypertension)   Physical deconditioning   Major depression in partial remission (HCC)   Malnutrition of moderate degree   #1.  Diarrhea.  No episodes for 24 hours.  Stool studies are negative.  Chart review indicates patient with a long history of diarrhea that family reported had worsened over the last several days.  Evaluated by gastroenterology who opine likely overflow diarrhea from constipation.  Abdominal x-rays this morning reveal no acute findings, no excessive colonic stool burden and unchanged left nephrolithiasis. Gastroenterology does not think repeat endoscopy or colonoscopy warranted at this time due to frailty -Advance diet -Monitor  #2.  Anemia.  Normocytic.  Hemoglobin had trended down in the last 4 months.  Iron 12.  B12 folate ferritin within the limits of normal.  FOBT negative.  Evaluated by gastroenterology who opined anemia is a new issue.  Do not recommend EGD or colonoscopy now due to patient's weakness but recommend consider EGD/colonoscopy in the future.  She received IV iron yesterday. -Monitor -Transfuse if hemoglobin 7 or less. -Recheck in the morning  #3.  Malnutrition/physical deconditioning.  This has been an ongoing issue for many years and  since January it has worsened.  After her stroke at that time she spent several weeks in inpatient rehab.  She reports her appetite waxes and wanes.  Evaluated by PT OT who recommend outpatient therapies.  Patient demonstrates on unwillingness to eat and or move about.  Denies suicidal ideation -Advance diet -Calorie count -If no improvement consider palliative care and/or behavioral health consult -Continue MVI  #4.  History of CVA.  Reportedly has some left-sided residual deficits. -Continue aspirin -PT and OT as noted above  #5.  Hypertension.  Blood pressure high end of normal.  Not orthostatic.  Home medications include atenolol, lisinopril, hydralazine.  She has been getting her atenolol -Resume home lisinopril -Continue atenolol -Monitor  #6.  Major depressive disorder.  Patient complains of feeling weak and listless.  Poor appetite.  She denies any suicidal ideation.  Home medications include Prozac. -Continue home meds -See #3   Family Communication/Anticipated D/C date and plan/Code Status   DVT prophylaxis: scd ordered. Code Status: Full Code.  Family Communication: patient and husband on phone. Husband verbalized good insight to ongoing situation and is reasonable.  Disposition Plan: Status is: Inpatient  Remains inpatient appropriate because:Inpatient level of care appropriate due to severity of illness   Dispo: The patient is from: Home              Anticipated d/c is to: Home              Anticipated d/c date is: 1 day              Patient currently is not medically stable to d/c.  Medical Consultants:   Outlaw.     Subjective:   Awake alert denies pain nausea discomfort.  Complains of "weak".  Objective:    Vitals:   09/12/19 1343 09/12/19 2043 09/13/19 0327 09/13/19 0900  BP: (!) 102/47 (!) 108/55 (!) 116/57   Pulse: 64 60 61   Resp: 14 16 16 18   Temp: 98.1 F (36.7 C) 98.4 F (36.9 C) 97.9 F (36.6 C) 97.8 F (36.6 C)  TempSrc: Oral  Oral Oral Oral  SpO2: 96% 94% 95% 98%  Weight:      Height:        Intake/Output Summary (Last 24 hours) at 09/13/2019 1033 Last data filed at 09/12/2019 1700 Gross per 24 hour  Intake 670 ml  Output 350 ml  Net 320 ml   Filed Weights   09/11/19 2047 09/12/19 0226  Weight: 43.9 kg 40.5 kg    Exam: General: Thin frail chronically ill-appearing somewhat pale/gray CV: Regular rate and rhythm no murmur gallop or rub no lower extremity edema Respiratory: No increased work of breathing breath sounds are distant but clear I hear no wheezes no rhonchi Abdomen soft nondistended sluggish bowel sounds mild tenderness to palpation particularly in the lower quadrants.  No guarding or rebounding Musculoskeletal: Joints without swelling/erythema moves all extremities spontaneously full range of motion Neuro: Awake alert oriented x3 speech clear facial symmetry somewhat flat affect    Data Reviewed:   I have personally reviewed following labs and imaging studies:  Labs: Labs show the following:   Basic Metabolic Panel: Recent Labs  Lab 09/11/19 2103 09/12/19 0347  NA 140 142  K 4.1 3.8  CL 107 109  CO2 23 23  GLUCOSE 100* 88  BUN 15 13  CREATININE 0.96 0.84  CALCIUM 8.3* 8.4*  MG  --  1.6*  PHOS  --  2.9   GFR Estimated Creatinine Clearance: 38.1 mL/min (by C-G formula based on SCr of 0.84 mg/dL). Liver Function Tests: Recent Labs  Lab 09/11/19 2103  AST 23  ALT 15  ALKPHOS 65  BILITOT 0.7  PROT 5.3*  ALBUMIN 1.8*   No results for input(s): LIPASE, AMYLASE in the last 168 hours. No results for input(s): AMMONIA in the last 168 hours. Coagulation profile No results for input(s): INR, PROTIME in the last 168 hours.  CBC: Recent Labs  Lab 09/11/19 2103 09/12/19 0347  WBC 8.3 9.4  NEUTROABS 6.4  --   HGB 7.8* 8.2*  HCT 25.0* 26.1*  MCV 90.3 89.4  PLT 248 260   Cardiac Enzymes: No results for input(s): CKTOTAL, CKMB, CKMBINDEX, TROPONINI in the last 168  hours. BNP (last 3 results) No results for input(s): PROBNP in the last 8760 hours. CBG: No results for input(s): GLUCAP in the last 168 hours. D-Dimer: No results for input(s): DDIMER in the last 72 hours. Hgb A1c: Recent Labs    09/12/19 0347  HGBA1C 5.7*   Lipid Profile: No results for input(s): CHOL, HDL, LDLCALC, TRIG, CHOLHDL, LDLDIRECT in the last 72 hours. Thyroid function studies: Recent Labs    09/12/19 0347  TSH 0.794   Anemia work up: Recent Labs    09/12/19 0347  VITAMINB12 515  FOLATE 12.9  FERRITIN 188  TIBC 169*  IRON 15*   Sepsis Labs: Recent Labs  Lab 09/11/19 2103 09/12/19 0347  WBC 8.3 9.4    Microbiology Recent Results (from the past 240 hour(s))  Respiratory Panel by RT PCR (Flu A&B, Covid) - Nasopharyngeal Swab  Status: None   Collection Time: 09/11/19 11:52 PM   Specimen: Nasopharyngeal Swab  Result Value Ref Range Status   SARS Coronavirus 2 by RT PCR NEGATIVE NEGATIVE Final    Comment: (NOTE) SARS-CoV-2 target nucleic acids are NOT DETECTED. The SARS-CoV-2 RNA is generally detectable in upper respiratoy specimens during the acute phase of infection. The lowest concentration of SARS-CoV-2 viral copies this assay can detect is 131 copies/mL. A negative result does not preclude SARS-Cov-2 infection and should not be used as the sole basis for treatment or other patient management decisions. A negative result may occur with  improper specimen collection/handling, submission of specimen other than nasopharyngeal swab, presence of viral mutation(s) within the areas targeted by this assay, and inadequate number of viral copies (<131 copies/mL). A negative result must be combined with clinical observations, patient history, and epidemiological information. The expected result is Negative. Fact Sheet for Patients:  https://www.moore.com/ Fact Sheet for Healthcare Providers:   https://www.young.biz/ This test is not yet ap proved or cleared by the [place] FDA and  has been authorized for detection and/or diagnosis of SARS-CoV-2 by FDA under an Emergency Use Authorization (EUA). This EUA will remain  in effect (meaning this test can be used) for the duration of the COVID-19 declaration under Section 564(b)(1) of the Act, 21 U.S.C. section 360bbb-3(b)(1), unless the authorization is terminated or revoked sooner.    Influenza A by PCR NEGATIVE NEGATIVE Final   Influenza B by PCR NEGATIVE NEGATIVE Final    Comment: (NOTE) The Xpert Xpress SARS-CoV-2/FLU/RSV assay is intended as an aid in  the diagnosis of influenza from Nasopharyngeal swab specimens and  should not be used as a sole basis for treatment. Nasal washings and  aspirates are unacceptable for Xpert Xpress SARS-CoV-2/FLU/RSV  testing. Fact Sheet for Patients: https://www.moore.com/ Fact Sheet for Healthcare Providers: https://www.young.biz/ This test is not yet approved or cleared by the [place] FDA and  has been authorized for detection and/or diagnosis of SARS-CoV-2 by  FDA under an Emergency Use Authorization (EUA). This EUA will remain  in effect (meaning this test can be used) for the duration of the  Covid-19 declaration under Section 564(b)(1) of the Act, 21  U.S.C. section 360bbb-3(b)(1), unless the authorization is  terminated or revoked. Performed at [facility], [address] [place], [place] [address]   Gastrointestinal Panel by PCR , Stool     Status: None   Collection Time: 09/12/19  2:33 AM   Specimen: Stool  Result Value Ref Range Status   Campylobacter species NOT DETECTED NOT DETECTED Final   Plesimonas shigelloides NOT DETECTED NOT DETECTED Final   Salmonella species NOT DETECTED NOT DETECTED Final   Yersinia enterocolitica NOT DETECTED NOT DETECTED Final   Vibrio species NOT DETECTED NOT DETECTED  Final   Vibrio cholerae NOT DETECTED NOT DETECTED Final   Enteroaggregative E coli (EAEC) NOT DETECTED NOT DETECTED Final   Enteropathogenic E coli (EPEC) NOT DETECTED NOT DETECTED Final   Enterotoxigenic E coli (ETEC) NOT DETECTED NOT DETECTED Final   Shiga like toxin producing E coli (STEC) NOT DETECTED NOT DETECTED Final   Shigella/Enteroinvasive E coli (EIEC) NOT DETECTED NOT DETECTED Final   Cryptosporidium NOT DETECTED NOT DETECTED Final   Cyclospora cayetanensis NOT DETECTED NOT DETECTED Final   Entamoeba histolytica NOT DETECTED NOT DETECTED Final   Giardia lamblia NOT DETECTED NOT DETECTED Final   Adenovirus F40/41 NOT DETECTED NOT DETECTED Final   Astrovirus NOT DETECTED NOT DETECTED Final  Norovirus GI/GII NOT DETECTED NOT DETECTED Final   Rotavirus A NOT DETECTED NOT DETECTED Final   Sapovirus (I, II, IV, and V) NOT DETECTED NOT DETECTED Final    Comment: Performed at [facility] [address] [place], [place] [address]    Procedures and diagnostic studies:  DG Chest Port 1 View  Result Date: 09/11/2019 CLINICAL DATA:  Weakness EXAM: PORTABLE CHEST 1 VIEW COMPARISON:  04/19/2019 FINDINGS: The lung volumes are somewhat low. There are hazy ill-defined opacities at the lung bases bilaterally. There is no pneumothorax. No large pleural effusion. The heart size is normal. Aortic calcifications are noted. There is no acute osseous abnormality. IMPRESSION: Low lung volumes with bibasilar atelectasis. Electronically Signed   By: Katherine Mantle M.D.   On: 09/11/2019 21:34    Medications:    aspirin EC  325 mg Oral Daily   atenolol  50 mg Oral Daily   atorvastatin  80 mg Oral QPM   FLUoxetine  40 mg Oral Daily   multivitamin with minerals  1 tablet Oral Daily   pantoprazole  40 mg Oral Daily   sodium chloride flush  3 mL Intravenous Q12H   Continuous Infusions:  lactated ringers 75 mL/hr at 09/12/19 1830     LOS: 2 days   Gwenyth Bender NP [facility]   How to contact the [facility] Attending or Consulting provider 7A - 7P or covering provider during after hours 7P -7A, for this patient?  Check the care team in [facility] and look for a) attending/consulting [facility] provider listed and b) the [facility] team listed Log into [email or web address] and use [place]'s universal password to access. If you do not have the password, please contact the hospital operator. Locate the [facility] provider you are looking for under [facility] and page to a number that you can be directly reached. If you still have difficulty reaching the provider, please page the [facility] (Director on Call) for the Hospitalists listed on amion for assistance.  09/13/2019, 10:33 AM

## 2019-09-13 NOTE — Progress Notes (Signed)
patient is complaining of left sided pain, especially in the arm, said it is hurting worse than normal.  We have repositioned her and given her tylenol.  She also received the klonopin about an hour ago.  Her husband said he was worried she was going to have another stroke.  BP 150/63,  HR 71 and she said she has had a headache all day.  Dr. Benjamine Mola notified, new orders received.

## 2019-09-13 NOTE — Progress Notes (Signed)
Nutrition Follow-up  DOCUMENTATION CODES:   Non-severe (moderate) malnutrition in context of chronic illness  INTERVENTION:   -Initiate 48 hour calorie count per MD -Ensure Enlive po BID, each supplement provides 350 kcal and 20 grams of protein -Magic cup TID with meals, each supplement provides 290 kcal and 9 grams of protein -MVI with minerals daily  NUTRITION DIAGNOSIS:   Moderate Malnutrition related to chronic illness(CVA) as evidenced by mild fat depletion, moderate fat depletion, mild muscle depletion, moderate muscle depletion, energy intake < or equal to 75% for > or equal to 1 month, percent weight loss.  Ongoing  GOAL:   Patient will meet greater than or equal to 90% of their needs  Progressing  MONITOR:   PO intake, Supplement acceptance, Diet advancement, Labs, Weight trends, Skin, I & O's  REASON FOR ASSESSMENT:   Consult Assessment of nutrition requirement/status  ASSESSMENT:   Patricia Stanley is a 74 yo CF with PMH CVA (04/2019) with residual left sided deficits, CAS s/p R CEA (04/2019), MDD, HTN, HLD who presents to the ER with weakness.  Reviewed I.O's: +320 ml x 24 hours and +1.5 L since admission  UOP: 350 ml x 24 hours  Per GI notes, pt awaiting c-diff panel results. X-ray revealed no stool burden.   Pt has been advanced to a regula diet today. No meal intake documentation available yet. MD requesting calorie count.  Labs reviewed.   Diet Order:   Diet Order            Diet regular Room service appropriate? Yes; Fluid consistency: Thin  Diet effective now              EDUCATION NEEDS:   No education needs have been identified at this time  Skin:  Skin Assessment: Reviewed RN Assessment  Last BM:  09/13/19  Height:   Ht Readings from Last 1 Encounters:  09/11/19 5\' 1"  (1.549 m)    Weight:   Wt Readings from Last 1 Encounters:  09/12/19 40.5 kg    Ideal Body Weight:  47.7 kg  BMI:  Body mass index is 16.87  kg/m.  Estimated Nutritional Needs:   Kcal:  1250-1450  Protein:  65-80 grams  Fluid:  > 1.2 L    11/12/19, RD, LDN, CDCES Registered Dietitian II Certified Diabetes Care and Education Specialist Please refer to [facility] for RD and/or RD on-call/weekend/after hours pager

## 2019-09-13 NOTE — Plan of Care (Signed)

## 2019-09-13 NOTE — Progress Notes (Signed)
[facility] Progress Note  Patricia Stanley 74 y.o. May 19, 1945  CC:  diarrhea  Subjective: Patient reports feeling "weak" this morning.  Reports ongoing diarrhea.  Denies nausea, vomiting, or abdominal pain.  Denies melena or hematochezia.  Endorses pain in her legs.  Per flowsheet, 1 mushy large bowel movement 5/11, 1 mushy brown bowel movement 5/10  ROS : Review of Systems  Respiratory: Negative for cough and shortness of breath.   Cardiovascular: Negative for chest pain and claudication.  Gastrointestinal: Positive for diarrhea. Negative for abdominal pain, blood in stool, constipation, heartburn, melena, nausea and vomiting.   Objective: Vital signs in last 24 hours: Vitals:   09/13/19 0327 09/13/19 0900  BP: (!) 116/57   Pulse: 61   Resp: 16 18  Temp: 97.9 F (36.6 C) 97.8 F (36.6 C)  SpO2: 95% 98%    Physical Exam:  General:  Lethargic, thin, cooperative, no acute distress  Head:  Normocephalic, without obvious abnormality, atraumatic  Eyes:  Conjunctival pallor, EOMs intact,   Lungs:   Clear to auscultation bilaterally, respirations unlabored  Heart:  Regular rate and rhythm, S1, S2 normal  Abdomen:   Soft, mild diffuse tenderness, normoactive bowel sounds, no guarding or peritoneal signs  Extremities: Extremities normal, atraumatic, no  Edema; SCDs in place    Lab Results: Recent Labs    09/11/19 2103 09/12/19 0347  NA 140 142  K 4.1 3.8  CL 107 109  CO2 23 23  GLUCOSE 100* 88  BUN 15 13  CREATININE 0.96 0.84  CALCIUM 8.3* 8.4*  MG  --  1.6*  PHOS  --  2.9   Recent Labs    09/11/19 2103  AST 23  ALT 15  ALKPHOS 65  BILITOT 0.7  PROT 5.3*  ALBUMIN 1.8*   Recent Labs    09/11/19 2103 09/12/19 0347  WBC 8.3 9.4  NEUTROABS 6.4  --   HGB 7.8* 8.2*  HCT 25.0* 26.1*  MCV 90.3 89.4  PLT 248 260   No results for input(s): LABPROT, INR in the last 72 hours.    Assessment: Acute on chronic diarrhea: etiology unknown.  GI  pathogen panel negative.   C. diff not collected.  Abdominal films 5/11 showed no acute findings, no excessive colonic stool burden.  Failure to thrive  Anemia: Hgb 8.2 without any signs of GI bleeding  Abnormal CT 04/21/19: esophageal thickening (lower 1/3), possibly secondary to esophagitis; No esophageal mass lesion evident per esophagram 04/22/19.  Patient has a history of GERD  Plan: Continue Protonix 40mg  qd.  C. Diff testing ordered.  If negative, anti-diarrheals can be used for symptom control.  Endoscopic evaluation deferred at this time due to patient's frailty.   PA-C 09/13/2019, 10:53 AM  Contact #  [phone]

## 2019-09-13 NOTE — Evaluation (Signed)
Occupational Therapy Evaluation Patient Details Name: Patricia Stanley MRN: [id number] DOB: [birth year] Today's Date: 09/13/2019    History of Present Illness Patricia Stanley is a 74 yo CF with PMH CVA (04/2019) with residual left sided deficits, CAS s/p R CEA (04/2019), MDD, HTN, HLD who presents to the ER with weakness and diarrhea.   Clinical Impression   PTA, pt was living at home with her husband, pt reports having assistance with ADL/IADL and mobility. Pt reports deconditioning has been limiting her participation in preferred, enjoyable activities such as cooking. Pt currently requires minA for functional mobility at RW level. She demonstrates decreased activity tolerance and decreased endurance. Pt demonstrates residual left upper extremity weakness, which she reports limits her using her dominant hand. Educated pt on benefit of using LUE in daily activities and general UE HEP. Due to decline in current level of function, pt would benefit from acute OT to address established goals to facilitate safe D/C to venue listed below. Based on PT discussion with husband and pt about pt's motivation, recommend outpatient follow-up. Will continue to follow acutely.     Follow Up Recommendations  Outpatient OT    Equipment Recommendations  None recommended by OT    Recommendations for Other Services       Precautions / Restrictions Precautions Precautions: Fall Restrictions Weight Bearing Restrictions: No      Mobility Bed Mobility Overal bed mobility: Needs Assistance Bed Mobility: Supine to Sit     Supine to sit: Min assist     General bed mobility comments: minA to progress trunk to upright position  Transfers Overall transfer level: Needs assistance Equipment used: Rolling walker (2 wheeled) Transfers: Sit to/from [employer] Sit to Stand: Min assist Stand pivot transfers: Min assist       General transfer comment: minA for stability and powerup    Balance  Overall balance assessment: Needs assistance Sitting-balance support: No upper extremity supported;Feet supported;Single extremity supported Sitting balance-Leahy Scale: Fair Sitting balance - Comments: able to sit unsupported, demonstrates preference for at least single UE support   Standing balance support: Bilateral upper extremity supported Standing balance-Leahy Scale: Poor Standing balance comment: requires UE support and external assist                           ADL either performed or assessed with clinical judgement   ADL Overall ADL's : Needs assistance/impaired Eating/Feeding: Set up;Sitting   Grooming: Set up;Sitting   Upper Body Bathing: Min guard;Sitting   Lower Body Bathing: Minimal assistance;Sit to/from stand   Upper Body Dressing : Min guard;Sitting   Lower Body Dressing: Minimal assistance;Sit to/from stand   Toilet Transfer: Minimal assistance;Stand-pivot   Toileting- Clothing Manipulation and Hygiene: Maximal assistance Toileting - Clothing Manipulation Details (indicate cue type and reason): pt incontinent of bowels, required assist for posterior care in standing     Functional mobility during ADLs: Minimal assistance;Rolling walker General ADL Comments: pt limited by weakness, decreased activity tolerance and LUE weakness     Vision Patient Visual Report: No change from baseline       Perception     Praxis      Pertinent Vitals/Pain Pain Assessment: No/denies pain     Hand Dominance Left   Extremity/Trunk Assessment Upper Extremity Assessment Upper Extremity Assessment: Generalized weakness;LUE deficits/detail LUE Deficits / Details: residual left side deficits from CVA 04/2019;pt able to reach full shoulder flexion with increased time and effort;strength 3/5; LUE Sensation:  WNL LUE Coordination: decreased fine motor;decreased gross motor   Lower Extremity Assessment Lower Extremity Assessment: Generalized weakness LLE  Deficits / Details: drags L foot with ambulation but pt able to slide L foot off bed in supine LLE Sensation: decreased proprioception LLE Coordination: decreased gross motor   Cervical / Trunk Assessment Cervical / Trunk Assessment: Kyphotic   Communication Communication Communication: Expressive difficulties   Cognition Arousal/Alertness: Awake/alert Behavior During Therapy: Flat affect Overall Cognitive Status: Within Functional Limits for tasks assessed                                 General Comments: WFL for basic mobility   General Comments  vss    Exercises     Shoulder Instructions      Home Living Family/patient expects to be discharged to:: Private residence Living Arrangements: Spouse/significant other;Children Available Help at Discharge: Family;Available 24 hours/day Type of Home: House Home Access: Stairs to enter [employer] of Steps: 6 Entrance Stairs-Rails: Left;Right Home Layout: One level     Bathroom Shower/Tub: Chief Strategy Officer: Standard Bathroom Accessibility: Yes   Home Equipment: Environmental consultant - 2 wheels;Bedside commode;Shower seat;Grab bars - tub/shower;Hand held shower head   Additional Comments: lives with husband and daughter      Prior Functioning/Environment Level of Independence: Needs assistance  Gait / Transfers Assistance Needed: ambulates with RW and has needed additional support last month ADL's / Homemaking Assistance Needed: family assist pt into shower and on the chair,    Comments: spouse reports pt's functional decline but one of biggest challenge is lack of motivation to [facility] or participate in anything        OT Problem List: Impaired balance (sitting and/or standing);Decreased activity tolerance;Decreased strength;Decreased safety awareness;Decreased knowledge of use of DME or AE;Decreased knowledge of precautions;Impaired UE functional use      OT Treatment/Interventions:  Self-care/ADL training;Therapeutic exercise;Energy conservation;DME and/or AE instruction;Therapeutic activities;Patient/family education;Balance training    OT Goals(Current goals can be found in the care plan section) Acute Rehab OT Goals Patient Stated Goal: to get stronger OT Goal Formulation: With patient Time For Goal Achievement: 09/27/19 Potential to Achieve Goals: Good ADL Goals Pt Will Perform Grooming: with set-up;sitting;standing Pt Will Perform Upper Body Dressing: with set-up;sitting Pt Will Perform Lower Body Dressing: with supervision;sit to/from stand Pt Will Transfer to Toilet: with supervision;ambulating  OT Frequency: Min 2X/week   Barriers to D/C:            Co-evaluation              AM-PAC OT "6 Clicks" Daily Activity     Outcome Measure Help from another person eating meals?: A Little Help from another person taking care of personal grooming?: A Little Help from another person toileting, which includes using toliet, bedpan, or urinal?: A Little Help from another person bathing (including washing, rinsing, drying)?: A Lot Help from another person to put on and taking off regular upper body clothing?: A Little Help from another person to put on and taking off regular lower body clothing?: A Little 6 Click Score: 17   End of Session Equipment Utilized During Treatment: Gait belt;Rolling walker Nurse Communication: Mobility status  Activity Tolerance: Patient tolerated treatment well Patient left: in chair;with call bell/phone within reach;with chair alarm set;with family/visitor present  OT Visit Diagnosis: Other abnormalities of gait and mobility (R26.89);Muscle weakness (generalized) (M62.81)  Time: 2694-8546 OT Time Calculation (min): 32 min Charges:  OT General Charges $OT Visit: 1 Visit OT Evaluation $OT Eval Moderate Complexity: 1 Mod OT Treatments $Self Care/Home Management : 8-22 mins  Helene Kelp OTR/L Acute Rehabilitation  Services Office: [phone]   Wyn Forster 09/13/2019, 12:31 PM

## 2019-09-14 DIAGNOSIS — F3341 Major depressive disorder, recurrent, in partial remission: Secondary | ICD-10-CM

## 2019-09-14 LAB — BASIC METABOLIC PANEL
Anion gap: 7 (ref 5–15)
BUN: 14 mg/dL (ref 8–23)
CO2: 23 mmol/L (ref 22–32)
Calcium: 7.8 mg/dL — ABNORMAL LOW (ref 8.9–10.3)
Chloride: 105 mmol/L (ref 98–111)
Creatinine, Ser: 0.84 mg/dL (ref 0.44–1.00)
GFR calc Af Amer: >60 mL/min (ref >60)
GFR calc non Af Amer: >60 mL/min (ref >60)
Glucose, Bld: 113 mg/dL — ABNORMAL HIGH (ref 70–99)
Potassium: 3.4 mmol/L — ABNORMAL LOW (ref 3.5–5.1)
Sodium: 135 mmol/L (ref 135–145)

## 2019-09-14 LAB — CBC
HCT: 23.6 % — ABNORMAL LOW (ref 36.0–46.0)
Hemoglobin: 7.3 g/dL — ABNORMAL LOW (ref 12.0–15.0)
MCH: 27.7 pg (ref 26.0–34.0)
MCHC: 30.9 g/dL (ref 30.0–36.0)
MCV: 89.4 fL (ref 80.0–100.0)
Platelets: 240 10*3/uL (ref 150–400)
RBC: 2.64 MIL/uL — ABNORMAL LOW (ref 3.87–5.11)
RDW: 14.8 % (ref 11.5–15.5)
WBC: 10.1 10*3/uL (ref 4.0–10.5)
nRBC: 0 % (ref 0.0–0.2)

## 2019-09-14 MED ORDER — POTASSIUM CHLORIDE CRYS ER 20 MEQ PO TBCR
40.0000 meq | EXTENDED_RELEASE_TABLET | Freq: Once | ORAL | Status: AC
  Administered 2019-09-14: 14:00:00 40 meq via ORAL
  Filled 2019-09-14: qty 2

## 2019-09-14 MED ORDER — PANCRELIPASE (LIP-PROT-AMYL) 36000-114000 UNITS PO CPEP
36000.0000 [IU] | ORAL_CAPSULE | Freq: Three times a day (TID) | ORAL | Status: DC
  Administered 2019-09-14 – 2019-09-16 (×8): 36000 [IU] via ORAL
  Filled 2019-09-14 (×8): qty 1

## 2019-09-14 MED ORDER — LOPERAMIDE HCL 2 MG PO CAPS: 2.0000 mg | ORAL_CAPSULE | ORAL | Status: DC | PRN

## 2019-09-14 MED ORDER — CHOLESTYRAMINE 4 G PO PACK
4.0000 g | PACK | Freq: Two times a day (BID) | ORAL | Status: DC
  Administered 2019-09-14 – 2019-09-16 (×5): 4 g via ORAL
  Filled 2019-09-14 (×6): qty 1

## 2019-09-14 NOTE — Progress Notes (Signed)
Calorie Count Note  RD working remotely.  48 hour calorie count ordered.  Diet: regular Supplements: MVI with minerals daily; Ensure Enlive po BID, each supplement provides 350 kcal and 20 grams of protein; Magic cup TID with meals, each supplement provides 290 kcal and 9 grams of protein  No meal completion data available in chart at this time.   Per MD notes, pt continues to complain of diarrhea and abdominal pain.   Nutrition Dx: Moderate Malnutrition related to chronic illness(CVA) as evidenced by mild fat depletion, moderate fat depletion, mild muscle depletion, moderate muscle depletion, energy intake < or equal to 75% for > or equal to 1 month, percent weight loss; ongoing  Goal: Patient will meet greater than or equal to 90% of their needs; progressing   Intervention:   -Continue 48 hour calorie count per MD -Continue Ensure Enlive po BID, each supplement provides 350 kcal and 20 grams of protein -Continue Magic cup TID with meals, each supplement provides 290 kcal and 9 grams of protein -Continue MVI with minerals daily  Levada Schilling, RD, LDN, CDCES Registered Dietitian II Certified Diabetes Care and Education Specialist Please refer to [facility] for RD and/or RD on-call/weekend/after hours pager

## 2019-09-14 NOTE — Plan of Care (Signed)

## 2019-09-14 NOTE — Progress Notes (Signed)
PT Cancellation Note  Patient Details Name: Patricia Stanley MRN: [id number] DOB: 1945-10-15   Cancelled Treatment:    Reason Eval/Treat Not Completed: Pain limiting ability to participate.  States her arms and legs are too painful to do anything in therapy.  Follow up with her to make progress in mobility toward a return to home and plan for outpatient therapy.  Per husband the MD and nurse are aware of her ongoing pain and he reports she will not be able to do therapy until the pain is better.  Follow up with nursing to see if any pain management can be done.   Ivar Drape 09/14/2019, 1:30 PM   Samul Dada, PT MS Acute Rehab Dept. Number: [facility] [id number] and [facility] [phone]

## 2019-09-14 NOTE — Progress Notes (Signed)
[facility] Progress Note  Patricia Stanley 74 y.o. 01-04-1946  CC:  diarrhea  Subjective: Patient reports continues weakness, as well as diffuse abdominal pain this morning.  She has ongoing diarrhea, with an episode of stool incontinence this morning after eating breakfast.  She also reports pain in her arms and legs.  Denies nausea or vomiting.  Denies melena or hematochezia.   ROS : Review of Systems  Respiratory: Negative for cough and shortness of breath.   Cardiovascular: Negative for chest pain and claudication.  Gastrointestinal: Positive for abdominal pain (diffuse) and diarrhea. Negative for blood in stool, constipation, heartburn, melena, nausea and vomiting.  Musculoskeletal: Positive for myalgias.   Objective: Vital signs in last 24 hours: Vitals:   09/14/19 0326 09/14/19 0836  BP: (!) 133/59 (!) 151/66  Pulse: 66 67  Resp: 17 18  Temp: 97.6 F (36.4 C) 98 F (36.7 C)  SpO2: 98% 96%    Physical Exam:  General:  Lethargic, thin, tearful  Head:  Normocephalic, without obvious abnormality, atraumatic  Eyes:  Conjunctival pallor, EOMs intact,   Lungs:   Clear to auscultation bilaterally, respirations unlabored  Heart:  Regular rate and rhythm, S1, S2 normal  Abdomen:   Soft, mild diffuse tenderness, normoactive bowel sounds, no guarding or peritoneal signs  Extremities: Extremities normal, atraumatic, no  edema    Lab Results: Recent Labs    09/12/19 0347 09/14/19 0251  NA 142 135  K 3.8 3.4*  CL 109 105  CO2 23 23  GLUCOSE 88 113*  BUN 13 14  CREATININE 0.84 0.84  CALCIUM 8.4* 7.8*  MG 1.6*  --   PHOS 2.9  --    Recent Labs    09/11/19 2103  AST 23  ALT 15  ALKPHOS 65  BILITOT 0.7  PROT 5.3*  ALBUMIN 1.8*   Recent Labs    09/11/19 2103 09/11/19 2103 09/12/19 0347 09/14/19 0251  WBC 8.3   < > 9.4 10.1  NEUTROABS 6.4  --   --   --   HGB 7.8*   < > 8.2* 7.3*  HCT 25.0*   < > 26.1* 23.6*  MCV 90.3   < > 89.4 89.4  PLT 248   <  > 260 240   < > = values in this interval not displayed.   No results for input(s): LABPROT, INR in the last 72 hours.    Assessment: Acute on chronic diarrhea: etiology unknown.  GI pathogen panel and C. diff negative.  Abdominal films 5/11 showed no acute findings, no excessive colonic stool burden to suggest overflow.  Failure to thrive: calorie count in place  Worsening anemia: Hgb 7.3 (decreased from 8.2 yesterday) without any signs of GI bleeding.  Low iron (15) and TIBC (169), normal ferritin as of 09/12/19.  B12 and folate WNL as of 09/12/19.  Abnormal CT 04/21/19: esophageal thickening (lower 1/3), possibly secondary to esophagitis; No esophageal mass lesion evident per esophagram 04/22/19.  Patient has a history of GERD  Plan: Creon 36,000u TID ordered.  Continue Protonix 40mg  qd.  Endoscopic evaluation deferred at this time due to patient's frailty and no current signs of GI bleeding.  PA-C 09/14/2019, 8:56 AM  Contact #  [phone]

## 2019-09-14 NOTE — Progress Notes (Signed)
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   Assessment & Plan:   Principal Problem:   Diarrhea Active Problems:   Major depression in partial remission (HCC)   HTN (hypertension)   Anemia   Physical deconditioning   Malnutrition of moderate degree   Diarrhea Review of EMR with indication that diarrhea has been chronic with worsening over the past several days prior to ED presentation.  Abdominal x-ray with no acute findings, no excessive colonic stool burden and unchanged left nephrolithiasis.  GI PCR panel negative.  C. difficile PCR negative. --[facility] gastroenterology following, appreciate assistance --GI states too frail for endoscopic evaluation at this point --Starting trial of pancreatic enzymes --Start cholestyramine twice daily --Imodium as needed --IV fluids with LR today --Continue to monitor strict I's notes  Anemia, normocytic Iron 15, TIBC 169, ferritin 188, folate 12.9, B12 515. --Hgb 7.8>8.2>7.3 --Feraheme IV on 09/12/2019 --Continue to monitor hemoglobin closely, transfuse for hemoglobin less than 7.0  Arthralgias CRP 0.6, unlikely inflammatory given normal CRP. --Supportive care, Tylenol as needed, tramadol as needed  Hx CVA with residual left-sided weakness Evaluated by PT/OT with recommendations of outpatient therapy on discharge. --Continue aspirin and statin  HLD: Continue atorvastatin 80 mg p.o. daily  Essential hypertension:  --Continue lisinopril 10 mg p.o. daily, and atenolol 50 mg p.o. daily --on statin and  aspirin  Hypokalemia Potassium 3.4 today, will replete. --Repeat electrolytes in a.m. to include magnesium  Moderate protein calorie malnutrition Adult failure to thrive, weakness, deconditioning Body mass index is 19.91 kg/m. Nutrition Status: Nutrition Problem: Moderate Malnutrition Etiology: chronic illness(CVA) Signs/Symptoms: mild fat depletion, moderate fat depletion, mild muscle depletion, moderate muscle depletion, energy intake < or equal to 75% for > or equal to 1 month, percent weight loss Percent weight loss: 11 % Interventions: Refer to RD note for recommendations  --Nutrition following; diet liberalized to regular; currently undergoing calorie count --PT/OT following --Palliative care consultation for assistance with goals ofcare and medical decision making  Depression Patient was seen by psychiatry on 09/14/2019; no acute issues identified and no changes recommended in current outpatient treatment. --Continue Prozac 40 mg p.o. daily --Continue clonazepam 1mg  TID; but recommend follow-up with outpatient provider regarding tapering clonazepam to decrease symptoms of weakness and fatigue   DVT prophylaxis: SCDs Code Status: Full code Family Communication: Updated patient spouse, via telephone this morning  Disposition Plan:  Status is: Inpatient  Remains inpatient appropriate because:Persistent severe electrolyte disturbances, Unsafe d/c plan and IV treatments appropriate due to intensity of illness or inability to take PO   Dispo: The patient is from: Home              Anticipated d/c is to: Home              Anticipated d/c date is: 1 day              Patient currently is not medically stable to d/c.     Consultants:   [facility] gastroenterology, [facility]  Psychiatry  Palliative care  Procedures:   None  Antimicrobials:   None   Subjective: Patient seen and examined bedside,  resting comfortably.  Continues to complain of diarrhea and  diffuse arthralgias.  Continues with poor appetite with poor desire for oral intake.  Updated patient's spouse via telephone this morning.  Started on pancreatic enzymes, cholestyramine, Imodium to assist with diarrhea.  Nutrition continues with calorie count.  No other complaints or concerns at this time.  Denies headache, no fever/chills/night sweats, no vomiting, no nausea, no chest pain, no palpitations, no shortness of breath.  No acute events overnight per nursing staff.  Objective: Vitals:   09/13/19 1946 09/14/19 0326 09/14/19 0500 09/14/19 0836  BP: (!) 126/52 (!) 133/59  (!) 151/66  Pulse: 73 66  67  Resp: 18 17  18   Temp: (!) 97.5 F (36.4 C) 97.6 F (36.4 C)  98 F (36.7 C)  TempSrc: Oral Oral  Oral  SpO2: 98% 98%  96%  Weight:   47.8 kg   Height:        Intake/Output Summary (Last 24 hours) at 09/14/2019 1412 Last data filed at 09/14/2019 0626 Gross per 24 hour  Intake 475.65 ml  Output 300 ml  Net 175.65 ml   Filed Weights   09/11/19 2047 09/12/19 0226 09/14/19 0500  Weight: 43.9 kg 40.5 kg 47.8 kg    Examination:  General exam: Appears calm and comfortable, thin in appearance with muscle wasting Respiratory system: Clear to auscultation. Respiratory effort normal.  Oxygenating well on room air Cardiovascular system: S1 & S2 heard, RRR. No JVD, murmurs, rubs, gallops or clicks. No pedal edema. Gastrointestinal system: Abdomen is nondistended, soft and nontender. No organomegaly or masses felt. Normal bowel sounds heard. Central nervous system: Alert and oriented. No focal neurological deficits. Extremities: Symmetric 5 x 5 power. Skin: No rashes, lesions or ulcers Psychiatry: Judgement and insight appear normal.  Depressed mood & flat affect.     Data Reviewed: I have personally reviewed following labs and imaging studies  CBC: Recent Labs  Lab 09/11/19 2103 09/12/19 0347 09/14/19 0251  WBC 8.3 9.4 10.1  NEUTROABS 6.4  --   --   HGB 7.8* 8.2* 7.3*   HCT 25.0* 26.1* 23.6*  MCV 90.3 89.4 89.4  PLT 248 260 034   Basic Metabolic Panel: Recent Labs  Lab 09/11/19 2103 09/12/19 0347 09/14/19 0251  NA 140 142 135  K 4.1 3.8 3.4*  CL 107 109 105  CO2 23 23 23   GLUCOSE 100* 88 113*  BUN 15 13 14   CREATININE 0.96 0.84 0.84  CALCIUM 8.3* 8.4* 7.8*  MG  --  1.6*  --   PHOS  --  2.9  --    GFR: Estimated Creatinine Clearance: 45 mL/min (by C-G formula based on SCr of 0.84 mg/dL). Liver Function Tests: Recent Labs  Lab 09/11/19 2103  AST 23  ALT 15  ALKPHOS 65  BILITOT 0.7  PROT 5.3*  ALBUMIN 1.8*   No results for input(s): LIPASE, AMYLASE in the last 168 hours. No results for input(s): AMMONIA in the last 168 hours. Coagulation Profile: No results for input(s): INR, PROTIME in the last 168 hours. Cardiac Enzymes: No results for input(s): CKTOTAL, CKMB, CKMBINDEX, TROPONINI in the last 168 hours. BNP (last 3 results) No results for input(s): PROBNP in the last 8760 hours. HbA1C: Recent Labs    09/12/19 0347  HGBA1C 5.7*   CBG: No results for input(s): GLUCAP in the last 168 hours. Lipid Profile: No results for input(s): CHOL, HDL, LDLCALC, TRIG, CHOLHDL, LDLDIRECT in the last 72 hours. Thyroid Function Tests: Recent  Labs    09/12/19 0347  TSH 0.794   Anemia Panel: Recent Labs    09/12/19 0347  VITAMINB12 515  FOLATE 12.9  FERRITIN 188  TIBC 169*  IRON 15*   Sepsis Labs: No results for input(s): PROCALCITON, LATICACIDVEN in the last 168 hours.  Recent Results (from the past 240 hour(s))  Respiratory Panel by RT PCR (Flu A&B, Covid) - Nasopharyngeal Swab     Status: None   Collection Time: 09/11/19 11:52 PM   Specimen: Nasopharyngeal Swab  Result Value Ref Range Status   SARS Coronavirus 2 by RT PCR NEGATIVE NEGATIVE Final    Comment: (NOTE) SARS-CoV-2 target nucleic acids are NOT DETECTED. The SARS-CoV-2 RNA is generally detectable in upper respiratoy specimens during the acute phase of  infection. The lowest concentration of SARS-CoV-2 viral copies this assay can detect is 131 copies/mL. A negative result does not preclude SARS-Cov-2 infection and should not be used as the sole basis for treatment or other patient management decisions. A negative result may occur with  improper specimen collection/handling, submission of specimen other than nasopharyngeal swab, presence of viral mutation(s) within the areas targeted by this assay, and inadequate number of viral copies (<131 copies/mL). A negative result must be combined with clinical observations, patient history, and epidemiological information. The expected result is Negative. Fact Sheet for Patients:  https://www.moore.com/ Fact Sheet for Healthcare Providers:  https://www.young.biz/ This test is not yet ap proved or cleared by the [place] FDA and  has been authorized for detection and/or diagnosis of SARS-CoV-2 by FDA under an Emergency Use Authorization (EUA). This EUA will remain  in effect (meaning this test can be used) for the duration of the COVID-19 declaration under Section 564(b)(1) of the Act, 21 U.S.C. section 360bbb-3(b)(1), unless the authorization is terminated or revoked sooner.    Influenza A by PCR NEGATIVE NEGATIVE Final   Influenza B by PCR NEGATIVE NEGATIVE Final    Comment: (NOTE) The Xpert Xpress SARS-CoV-2/FLU/RSV assay is intended as an aid in  the diagnosis of influenza from Nasopharyngeal swab specimens and  should not be used as a sole basis for treatment. Nasal washings and  aspirates are unacceptable for Xpert Xpress SARS-CoV-2/FLU/RSV  testing. Fact Sheet for Patients: https://www.moore.com/ Fact Sheet for Healthcare Providers: https://www.young.biz/ This test is not yet approved or cleared by the [place] FDA and  has been authorized for detection and/or diagnosis of SARS-CoV-2 by  FDA under  an Emergency Use Authorization (EUA). This EUA will remain  in effect (meaning this test can be used) for the duration of the  Covid-19 declaration under Section 564(b)(1) of the Act, 21  U.S.C. section 360bbb-3(b)(1), unless the authorization is  terminated or revoked. Performed at [facility], [address] [place], [place] [address]   Gastrointestinal Panel by PCR , Stool     Status: None   Collection Time: 09/12/19  2:33 AM   Specimen: Stool  Result Value Ref Range Status   Campylobacter species NOT DETECTED NOT DETECTED Final   Plesimonas shigelloides NOT DETECTED NOT DETECTED Final   Salmonella species NOT DETECTED NOT DETECTED Final   Yersinia enterocolitica NOT DETECTED NOT DETECTED Final   Vibrio species NOT DETECTED NOT DETECTED Final   Vibrio cholerae NOT DETECTED NOT DETECTED Final   Enteroaggregative E coli (EAEC) NOT DETECTED NOT DETECTED Final   Enteropathogenic E coli (EPEC) NOT DETECTED NOT DETECTED Final   Enterotoxigenic E coli (ETEC) NOT DETECTED NOT DETECTED Final   Shiga  like toxin producing E coli (STEC) NOT DETECTED NOT DETECTED Final   Shigella/Enteroinvasive E coli (EIEC) NOT DETECTED NOT DETECTED Final   Cryptosporidium NOT DETECTED NOT DETECTED Final   Cyclospora cayetanensis NOT DETECTED NOT DETECTED Final   Entamoeba histolytica NOT DETECTED NOT DETECTED Final   Giardia lamblia NOT DETECTED NOT DETECTED Final   Adenovirus F40/41 NOT DETECTED NOT DETECTED Final   Astrovirus NOT DETECTED NOT DETECTED Final   Norovirus GI/GII NOT DETECTED NOT DETECTED Final   Rotavirus A NOT DETECTED NOT DETECTED Final   Sapovirus (I, II, IV, and V) NOT DETECTED NOT DETECTED Final    Comment: Performed at [facility], [address] [place], [place] [address]  C Difficile Quick Screen w PCR reflex     Status: None   Collection Time: 09/13/19 11:50 AM   Specimen: STOOL  Result Value Ref Range Status   C Diff antigen NEGATIVE NEGATIVE Final   C Diff  toxin NEGATIVE NEGATIVE Final   C Diff interpretation No C. difficile detected.  Final    Comment: Performed at [facility], [address], [place], [place] [address]         Radiology Studies: DG Abd 2 Views  Result Date: 09/13/2019 CLINICAL DATA:  Diarrhea. EXAM: ABDOMEN - 2 VIEW COMPARISON:  CT abdomen pelvis dated April 21, 2019. FINDINGS: The bowel gas pattern is normal. No excessive colonic stool burden. There is no evidence of free air. Unchanged small calculus in the upper pole of the left kidney. Chronic interstitial changes at the lung bases again noted. Bilateral gluteal dystrophic calcifications again noted. No acute osseous abnormality. IMPRESSION: 1. No acute findings.  No excessive colonic stool burden. 2. Unchanged left nephrolithiasis. Electronically Signed   By: Obie Dredge M.D.   On: 09/13/2019 10:35        Scheduled Meds:  aspirin EC  325 mg Oral Daily   atenolol  50 mg Oral Daily   atorvastatin  80 mg Oral QPM   cholestyramine  4 g Oral BID   feeding supplement (ENSURE ENLIVE)  237 mL Oral BID BM   FLUoxetine  40 mg Oral Daily   lipase/protease/amylase  36,000 Units Oral TID WC   lisinopril  10 mg Oral Daily   multivitamin with minerals  1 tablet Oral Daily   pantoprazole  40 mg Oral Daily   sodium chloride flush  3 mL Intravenous Q12H   Continuous Infusions:  lactated ringers 75 mL/hr at 09/14/19 0626     LOS: 3 days    Time spent: 36 minutes spent on chart review, discussion with nursing staff, consultants, updating family and interview/physical exam; more than 50% of that time was spent in counseling and/or coordination of care.    Alvira Philips [place], DO [facility] Available via Epic secure chat 7am-7pm After these hours, please refer to coverage provider listed on [email or web address] 09/14/2019, 2:12 PM

## 2019-09-14 NOTE — Consult Note (Signed)
Telepsych Consultation   Reason for Consult:  "Depression in setting of AFTT. Please eval if needs change vs escalation in depression treatment" Referring Physician:  Dr Stanley Location of Patient: [facility] 1V40 Location of Provider: [facility]  Patient Identification: Patricia Stanley MRN:  [id number] Principal Diagnosis: Diarrhea Diagnosis:  Principal Problem:   Diarrhea Active Problems:   Major depression in partial remission (HCC)   HTN (hypertension)   Anemia   Physical deconditioning   Malnutrition of moderate degree   Total Time spent with patient: 30 minutes  Subjective:   Patricia Stanley is a 74 y.o. female patient.  Patient assessed by nurse practitioner.  Patient gives verbal consent for husband, Patricia Stanley to remain at bedside during assessment.  Patient alert and oriented, answers appropriately.  Patient reports difficulty hearing, at times. Patient states "I came to the hospital because of diarrhea." Patient denies suicidal ideations.  Patient denies history of suicide attempts.  Patient denies self-harm behaviors.  Patient denies homicidal ideations.  Patient denies auditory and visual hallucinations.  Patient denies symptoms of paranoia. Patient reports she lives in [place] with her husband, also in the home are her daughter and her grandson.  Patient denies access to weapons.  Patient reports she is retired.  Patient denies alcohol and substance use. Patient denies outpatient psychiatrist.  Patient reports primary care provider prescribes Prozac 40 mg daily and Klonopin 1 mg 3 times daily.  Patient reports husband and daughter assist her with ADLs and medication management.  Patient reports compliance with medications. Patient reports stressor "my mother died in 89." Patient's husband, Patricia Stanley states "since the stroke and now we have to help her to the bathroom and other things I think she is unmotivated, she likes to lay in bed.  When  physical therapy comes to our home she did participate because she wanted them to come on her schedule, now we plan to go to out-of-home therapy and I think that will be good for her." Patient's husband reports consistently administering Prozac 40 mg daily since May 10, 2019.  Patient reports "she can take (prescribed amount) up to 3 Klonopin per day but she can get by with only 2."  Patient's husband states "she has been taking Klonopin for as long as I can remember,"  husband believes approximately 10 years.  HPI: Patient arrives with chief complaints of weakness and diarrhea.  Past Psychiatric History: depression, anxiety  Risk to Self: Is patient at risk for suicide?: No Risk to Others:   Denies Prior Inpatient Therapy:  Denies Prior Outpatient Therapy:  "I went to a psychiatrist once years ago."  Past Medical History:  Past Medical History:  Diagnosis Date  . Closed fracture of coccyx (HCC) 2015   AFTER FALL  . Depression   . GERD (gastroesophageal reflux disease)   . Headache    MIGRAINES OCCASIONAL  . History of kidney stones    HAS NOW  . Hypertension     Past Surgical History:  Procedure Laterality Date  . ABDOMINAL HYSTERECTOMY     1 OVARY LEFT  . APPENDECTOMY  AGE 86   DONE WITH OVARY REMOVAL  . COLONOSCOPY WITH PROPOFOL N/A 12/03/2015   Procedure: COLONOSCOPY WITH PROPOFOL;  Surgeon: Patricia Bumpers, MD;  Location: [facility];  Service: Endoscopy;  Laterality: N/A;  . ENDARTERECTOMY Right 04/21/2019   Procedure: ENDARTERECTOMY CAROTID;  Surgeon: Patricia Shelling, MD;  Location: [facility] OR;  Service: Vascular;  Laterality: Right;  . ESOPHAGOGASTRODUODENOSCOPY (  EGD) WITH PROPOFOL N/A 12/03/2015   Procedure: ESOPHAGOGASTRODUODENOSCOPY (EGD) WITH PROPOFOL;  Surgeon: Patricia Bumpers, MD;  Location: [facility];  Service: Endoscopy;  Laterality: N/A;  . TONSILLECTOMY  AGE 77   AND ADENOIDS REMOVED   Family History:  Family History  Problem Relation Age of Onset   . Hypertension Mother   . Hyperlipidemia Mother   . Hypertension Father   . Hyperlipidemia Father    Family Psychiatric  History: Denies Social History:  Social History   Substance and Sexual Activity  Alcohol Use No     Social History   Substance and Sexual Activity  Drug Use No    Social History   Socioeconomic History  . Marital status: Married    Spouse name: Not on file  . Number of children: Not on file  . Years of education: Not on file  . Highest education level: Not on file  Occupational History  . Not on file  Tobacco Use  . Smoking status: Former Smoker    Quit date: 05/06/1995    Years since quitting: 24.3  . Smokeless tobacco: Never Used  Substance and Sexual Activity  . Alcohol use: No  . Drug use: No  . Sexual activity: Not on file  Other Topics Concern  . Not on file  Social History Narrative  . Not on file   Social Determinants of Health   Financial Resource Strain:   . Difficulty of Paying Living Expenses:   Food Insecurity:   . Worried About Programme researcher, broadcasting/film/video in the Last Year:   . Barista in the Last Year:   Transportation Needs:   . Freight forwarder (Medical):   [name] Lack of Transportation (Non-Medical):   Physical Activity:   . Days of Exercise per Week:   . Minutes of Exercise per Session:   Stress:   . Feeling of Stress :   Social Connections:   . Frequency of Communication with Friends and Family:   . Frequency of Social Gatherings with Friends and Family:   . Attends Religious Services:   . Active Member of Clubs or Organizations:   . Attends Banker Meetings:   [name] Marital Status:    Additional Social History:    Allergies:   Allergies  Allergen Reactions  . Antihistamines, Chlorpheniramine-Type Other (See Comments)    Makes her heart race  . Paroxetine Other (See Comments)    Shakes-tremors    Labs:  Results for orders placed or performed during the hospital encounter of 09/11/19 (from the  past 48 hour(s))  C Difficile Quick Screen w PCR reflex     Status: None   Collection Time: 09/13/19 11:50 AM   Specimen: STOOL  Result Value Ref Range   C Diff antigen NEGATIVE NEGATIVE   C Diff toxin NEGATIVE NEGATIVE   C Diff interpretation No C. difficile detected.     Comment: Performed at [facility], [address], [place], [place] [address]  CBC     Status: Abnormal   Collection Time: 09/14/19  2:51 AM  Result Value Ref Range   WBC 10.1 4.0 - 10.5 K/uL   RBC 2.64 (L) 3.87 - 5.11 MIL/uL   Hemoglobin 7.3 (L) 12.0 - 15.0 g/dL   HCT 74.1 (L) 28.7 - 86.7 %   MCV 89.4 80.0 - 100.0 fL   MCH 27.7 26.0 - 34.0 pg   MCHC 30.9 30.0 - 36.0 g/dL   RDW 67.2 09.4 -  15.5 %   Platelets 240 150 - 400 K/uL   nRBC 0.0 0.0 - 0.2 %    Comment: Performed at [facility], [place] [address] [place], [place] [address]  Basic metabolic panel     Status: Abnormal   Collection Time: 09/14/19  2:51 AM  Result Value Ref Range   Sodium 135 135 - 145 mmol/L    Comment: DELTA CHECK NOTED   Potassium 3.4 (L) 3.5 - 5.1 mmol/L   Chloride 105 98 - 111 mmol/L   CO2 23 22 - 32 mmol/L   Glucose, Bld 113 (H) 70 - 99 mg/dL    Comment: Glucose reference range applies only to samples taken after fasting for at least 8 hours.   BUN 14 8 - 23 mg/dL   Creatinine, Ser 0.84 0.44 - 1.00 mg/dL   Calcium 7.8 (L) 8.9 - 10.3 mg/dL   GFR calc non Af Amer >60 >60 mL/min   GFR calc Af Amer >60 >60 mL/min   Anion gap 7 5 - 15    Comment: Performed at [facility] [address], [place], [place] [address]    Medications:  Current Facility-Administered Medications  Medication Dose Route Frequency Provider Last Rate Last Admin  . acetaminophen (TYLENOL) tablet 650 mg  650 mg Oral Q6H PRN Eulogio Bear U, DO   650 mg at 09/14/19 0447  . aspirin EC tablet 325 mg  325 mg Oral Daily Dwyane Dee, MD   325 mg at 09/14/19 0923  . atenolol (TENORMIN) tablet 50 mg  50 mg Oral Daily Dwyane Dee, MD   50 mg  at 09/14/19 0923  . atorvastatin (LIPITOR) tablet 80 mg  80 mg Oral QPM Dwyane Dee, MD   80 mg at 09/13/19 1643  . cholestyramine (QUESTRAN) packet 4 g  4 g Oral BID [place] Donnamarie Poag, DO      . clonazePAM [facility]) tablet 1 mg  1 mg Oral BID PRN Eulogio Bear U, DO   1 mg at 09/13/19 1643  . feeding supplement (ENSURE ENLIVE) (ENSURE ENLIVE) liquid 237 mL  237 mL Oral BID BM Vann, Jessica U, DO   237 mL at 09/14/19 0923  . FLUoxetine (PROZAC) capsule 40 mg  40 mg Oral Daily Dwyane Dee, MD   40 mg at 09/14/19 0923  . lactated ringers infusion   Intravenous Continuous Dwyane Dee, MD 75 mL/hr at 09/14/19 0626 New Bag at 09/14/19 3825  . lipase/protease/amylase (CREON) capsule 36,000 Units  36,000 Units Oral TID WC Baron-Johnson, Alison, PA-C      . lisinopril (ZESTRIL) tablet 10 mg  10 mg Oral Daily Radene Gunning, NP   10 mg at 09/14/19 0923  . loperamide (IMODIUM) capsule 2 mg  2 mg Oral PRN [place] Eric J, DO      . multivitamin with minerals tablet 1 tablet  1 tablet Oral Daily Dwyane Dee, MD   1 tablet at 09/14/19 [phone]  . pantoprazole (PROTONIX) EC tablet 40 mg  40 mg Oral Daily Dwyane Dee, MD   40 mg at 09/14/19 0923  . potassium chloride SA (KLOR-CON) CR tablet 40 mEq  40 mEq Oral Once [place] Donnamarie Poag, DO      . sodium chloride flush (NS) 0.9 % injection 3 mL  3 mL Intravenous Eddie Candle, MD   3 mL at 09/13/19 2236  . traMADol (ULTRAM) tablet 50 mg  50 mg Oral Q6H PRN Eulogio Bear U, DO   50  mg at 09/14/19 1028    Musculoskeletal: Strength & Muscle Tone: within normal limits Gait & Station: unable to assess Patient leans: N/A  Psychiatric Specialty Exam: Physical Exam  Nursing note and vitals reviewed. Constitutional: She is oriented to person, place, and time. She appears well-developed.  HENT:  Head: Normocephalic.  Cardiovascular: Normal rate.  Respiratory: Effort normal.  Neurological: She is alert and oriented to person, place, and time.   Psychiatric: She has a normal mood and affect. Her speech is normal and behavior is normal. Judgment and thought content normal. Cognition and memory are normal.    Review of Systems  Constitutional: Negative.   HENT: Negative.   Eyes: Negative.   Respiratory: Negative.   Cardiovascular: Negative.   Gastrointestinal: Negative.   Genitourinary: Negative.   Musculoskeletal: Negative.   Skin: Negative.   Neurological: Negative.   Psychiatric/Behavioral: Negative.     Blood pressure (!) 151/66, pulse 67, temperature 98 F (36.7 C), temperature source Oral, resp. rate 18, height 5\' 1"  (1.549 m), weight 47.8 kg, SpO2 96 %.Body mass index is 19.91 kg/m.  General Appearance: Casual and Fairly Groomed  Eye Contact:  Good  Speech:  Clear and Coherent and Normal Rate  Volume:  Normal  Mood:  Euthymic  Affect:  Appropriate and Congruent  Thought Process:  Coherent, Goal Directed and Descriptions of Associations: Intact  Orientation:  Full (Time, Place, and Person)  Thought Content:  WDL and Logical  Suicidal Thoughts:  No  Homicidal Thoughts:  No  Memory:  Immediate;   Good Recent;   Good Remote;   Good  Judgement:  Good  Insight:  Fair  Psychomotor Activity:  Normal  Concentration:  Concentration: Good and Attention Span: Good  Recall:  Good  Fund of Knowledge:  Good  Language:  Good  Akathisia:  No  Handed:  Right  AIMS (if indicated):     Assets:  Communication Skills Desire for Improvement Financial Resources/Insurance Housing Intimacy Leisure Time Resilience Social Support  ADL's:  Impaired  Cognition:  WNL  Sleep:        Treatment Plan Summary: Patient discussed with Patricia. . Recommend continue Prozac 40mg  daily, recommend follow up with outpatient provider regarding tapering clonazepam to decrease symptoms of weakness and fatigue. Plan follow up with established outpatient prescriber.   Disposition: No evidence of imminent risk to self or others at present.    Patient does not meet criteria for psychiatric inpatient admission. Supportive therapy provided about ongoing stressors.  This service was provided via telemedicine using a 2-way, interactive audio and video technology.  Names of all persons participating in this telemedicine service and their role in this encounter. Name: Patricia Stanley Role: Patient  Name:  Role: Patient's husband  Name: [name]  Role: FNP    Willow Ora, FNP 09/14/2019 12:44 PM

## 2019-09-14 NOTE — Plan of Care (Signed)
  Problem: Education: Goal: Knowledge of General Education information will improve Description: Including pain rating scale, medication(s)/side effects and non-pharmacologic comfort measures Outcome: Progressing   Problem: Clinical Measurements: Goal: Respiratory complications will improve Outcome: Progressing   Problem: Safety: Goal: Ability to remain free from injury will improve Outcome: Progressing   

## 2019-09-15 DIAGNOSIS — Z66 Do not resuscitate: Secondary | ICD-10-CM

## 2019-09-15 DIAGNOSIS — Z515 Encounter for palliative care: Secondary | ICD-10-CM

## 2019-09-15 DIAGNOSIS — Z7189 Other specified counseling: Secondary | ICD-10-CM

## 2019-09-15 LAB — CBC
HCT: 23.8 % — ABNORMAL LOW (ref 36.0–46.0)
Hemoglobin: 7.6 g/dL — ABNORMAL LOW (ref 12.0–15.0)
MCH: 27.7 pg (ref 26.0–34.0)
MCHC: 31.9 g/dL (ref 30.0–36.0)
MCV: 86.9 fL (ref 80.0–100.0)
Platelets: 289 10*3/uL (ref 150–400)
RBC: 2.74 MIL/uL — ABNORMAL LOW (ref 3.87–5.11)
RDW: 14.9 % (ref 11.5–15.5)
WBC: 11.7 10*3/uL — ABNORMAL HIGH (ref 4.0–10.5)
nRBC: 0 % (ref 0.0–0.2)

## 2019-09-15 LAB — MAGNESIUM: Magnesium: 1.3 mg/dL — ABNORMAL LOW (ref 1.7–2.4)

## 2019-09-15 LAB — BASIC METABOLIC PANEL
Anion gap: 9 (ref 5–15)
BUN: 9 mg/dL (ref 8–23)
CO2: 24 mmol/L (ref 22–32)
Calcium: 8.1 mg/dL — ABNORMAL LOW (ref 8.9–10.3)
Chloride: 104 mmol/L (ref 98–111)
Creatinine, Ser: 0.72 mg/dL (ref 0.44–1.00)
GFR calc Af Amer: >60 mL/min (ref >60)
GFR calc non Af Amer: >60 mL/min (ref >60)
Glucose, Bld: 101 mg/dL — ABNORMAL HIGH (ref 70–99)
Potassium: 3.7 mmol/L (ref 3.5–5.1)
Sodium: 137 mmol/L (ref 135–145)

## 2019-09-15 MED ORDER — ACETAMINOPHEN 500 MG PO TABS
1000.0000 mg | ORAL_TABLET | Freq: Three times a day (TID) | ORAL | Status: DC
  Administered 2019-09-15 – 2019-09-16 (×4): 1000 mg via ORAL
  Filled 2019-09-15 (×4): qty 2

## 2019-09-15 MED ORDER — MAGNESIUM SULFATE 2 GM/50ML IV SOLN
2.0000 g | INTRAVENOUS | Status: AC
  Administered 2019-09-15 (×2): 2 g via INTRAVENOUS
  Filled 2019-09-15 (×2): qty 50

## 2019-09-15 MED ORDER — POTASSIUM CHLORIDE CRYS ER 20 MEQ PO TBCR
30.0000 meq | EXTENDED_RELEASE_TABLET | Freq: Once | ORAL | Status: AC
  Administered 2019-09-15: 30 meq via ORAL
  Filled 2019-09-15: qty 1

## 2019-09-15 MED ORDER — ONDANSETRON HCL 4 MG PO TABS
4.0000 mg | ORAL_TABLET | Freq: Three times a day (TID) | ORAL | Status: DC
  Administered 2019-09-15 – 2019-09-16 (×4): 4 mg via ORAL
  Filled 2019-09-15 (×4): qty 1

## 2019-09-15 MED ORDER — MIRTAZAPINE 15 MG PO TABS
15.0000 mg | ORAL_TABLET | Freq: Every day | ORAL | Status: DC
  Administered 2019-09-15: 15 mg via ORAL
  Filled 2019-09-15: qty 1

## 2019-09-15 MED ORDER — FERROUS SULFATE 325 (65 FE) MG PO TABS
325.0000 mg | ORAL_TABLET | Freq: Two times a day (BID) | ORAL | Status: DC
  Administered 2019-09-15 – 2019-09-16 (×4): 325 mg via ORAL
  Filled 2019-09-15 (×4): qty 1

## 2019-09-15 NOTE — Progress Notes (Signed)
Calorie Count Note  48 hour calorie count ordered.  Diet: regular Supplements: Ensure Enlive po BID, each supplement provides 350 kcal and 20 grams of protein;MVI with minerals daily; Magic cup TID with meals, each supplement provides 290 kcal and 9 grams of protein  Caloric count data incomplete. Pt consumes 0% of breakfast on 09/14/19. No recorded meal data or tickets available to review. Pt is consuming Ensure supplements per MAR.   Palliative care following; plan for symptom management and to treat the treatable.   Nutrition Dx: Moderate Malnutritionrelated to chronic illness(CVA)as evidenced by mild fat depletion, moderate fat depletion, mild muscle depletion, moderate muscle depletion, energy intake < or equal to 75% for > or equal to 1 month, percent weight loss; ongoing  Goal: Patient will meet greater than or equal to 90% of their needs; unment  Intervention:   -D/c calorie count -Continue Ensure Enlive po BID, each supplement provides 350 kcal and 20 grams of protein -Continue Magic cup TID with meals, each supplement provides 290 kcal and 9 grams of protein -Continue MVI with minerals daily  Levada Schilling, RD, LDN, CDCES Registered Dietitian II Certified Diabetes Care and Education Specialist Please refer to [facility] for RD and/or RD on-call/weekend/after hours pager

## 2019-09-15 NOTE — Progress Notes (Signed)
[facility] Gastroenterology Progress Note  Patricia Stanley 74 y.o. [birth year]  CC:  Diarrhea, failure to thrive  Subjective: Patient reports continues weakness.  She has some nausea but denies any abdominal discomfort this morning.  Her last bowel movement was last night, which was still loose. No melenic or bloody stools. She has not had any bowel movements today thus far.  She tolerated a regular breakfast with some scrambled egg/cheese, bacon, and toast.  She is sitting comfortably in bed, eating breakfast, and looks much improved compared to yesterday morning's exam.  ROS : Review of Systems  Respiratory: Negative for cough and shortness of breath.   Cardiovascular: Negative for chest pain and claudication.  Gastrointestinal: Positive for diarrhea and nausea. Negative for abdominal pain, blood in stool, constipation, heartburn, melena and vomiting.   Objective: Vital signs in last 24 hours: Vitals:   09/15/19 0422 09/15/19 0842  BP: (!) 133/58 135/61  Pulse: 83 84  Resp: 19   Temp: 98.5 F (36.9 C)   SpO2: 94%     Physical Exam:  General:  Lethargic, thin, sitting up in bed eating breakfast, appears comfortable  Head:  Normocephalic, without obvious abnormality, atraumatic  Eyes:  Conjunctival pallor, EOMs intact  Lungs:   Clear to auscultation bilaterally, respirations unlabored  Heart:  Regular rate and rhythm, S1, S2 normal  Abdomen:   Soft, thin, nontender, nondistended, normoactive bowel sounds, no guarding or peritoneal signs  Extremities: Extremities normal, atraumatic, no  edema    Lab Results: Recent Labs    09/14/19 0251 09/15/19 0734  NA 135 137  K 3.4* 3.7  CL 105 104  CO2 23 24  GLUCOSE 113* 101*  BUN 14 9  CREATININE 0.84 0.72  CALCIUM 7.8* 8.1*  MG  --  1.3*   No results for input(s): AST, ALT, ALKPHOS, BILITOT, PROT, ALBUMIN in the last 72 hours. Recent Labs    09/14/19 0251 09/15/19 0734  WBC 10.1 11.7*  HGB 7.3* 7.6*  HCT 23.6* 23.8*  MCV  89.4 86.9  PLT 240 289   No results for input(s): LABPROT, INR in the last 72 hours.   Assessment: Acute on chronic diarrhea: etiology unknown.  Improving on pancreatic enzymes, cholestyramine, and PRN Immodium.  GI pathogen panel and C. diff negative.  Abdominal films 5/11 showed no acute findings, no excessive colonic stool burden to suggest overflow.  Failure to thrive: calorie count in place  Acute on chronic anemia: Hgb 7.6 today (stable as compared to 7.3 yesterday).  No signs of GI bleeding.  Low iron (15) and TIBC (169), normal ferritin as of 09/12/19.  B12 and folate WNL as of 09/12/19.  FOBT negative 09/11/19.  Abnormal CT 04/21/19: esophageal thickening (lower 1/3), possibly secondary to esophagitis; No esophageal mass lesion evident per esophagram 04/22/19.  Patient has a history of GERD  Plan: Continue Creon 36,000u TID, cholestyramine, and PRN immodium.  Continue Protonix 40mg  qd.  Discussed with Dr. .  Endoscopic evaluation deferred at this time due to patient's frailty and no current signs of GI bleeding.  Dulce Sellar PA-C 09/15/2019, 9:00 AM  Contact #  [phone]

## 2019-09-15 NOTE — Progress Notes (Addendum)
Currently is resting quietly in bed with eyes closed. Resident has been asleep soundly  atleast an hour after taking requested PRN klonopin and requested tramadol for mod. generalized discomfort around at 2021.  No noted episode of diarrhea, quiet nauseated without vomiting after dinner but felt better. Will cont. To monitor.

## 2019-09-15 NOTE — Plan of Care (Signed)
  Problem: Education: Goal: Knowledge of General Education information will improve Description: Including pain rating scale, medication(s)/side effects and non-pharmacologic comfort measures Outcome: Progressing   Problem: Clinical Measurements: Goal: Ability to maintain clinical measurements within normal limits will improve Outcome: Progressing   Problem: Clinical Measurements: Goal: Will remain free from infection Outcome: Progressing   Problem: Clinical Measurements: Goal: Diagnostic test results will improve Outcome: Progressing   Problem: Health Behavior/Discharge Planning: Goal: Ability to manage health-related needs will improve Outcome: Progressing

## 2019-09-15 NOTE — Progress Notes (Addendum)
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   Assessment & Plan:   Principal Problem:   Diarrhea Active Problems:   Major depression in partial remission (HCC)   HTN (hypertension)   Anemia   Physical deconditioning   Malnutrition of moderate degree   Diarrhea Review of EMR with indication that diarrhea has been chronic with worsening over the past several days prior to ED presentation.  Abdominal x-ray with no acute findings, no excessive colonic stool burden and unchanged left nephrolithiasis.  GI PCR panel negative.  C. difficile PCR negative. --[facility] gastroenterology following, appreciate assistance --GI states too frail for endoscopic evaluation at this point --Only reported one bowel movement yesterday --Continue Creon 36,000 units 3 times daily with meals --cholestyramine 4g PO twice daily --Imodium as needed --Continue to monitor strict I's and O's  Anemia, normocytic Iron 15, TIBC 169, ferritin 188, folate 12.9, B12 515. --Hgb 7.8>8.2>7.3>7.6 --Feraheme IV on 09/12/2019 --Continue to monitor hemoglobin closely, transfuse for hemoglobin less than 7.0  Arthralgias CRP 0.6, unlikely inflammatory given normal CRP. --Supportive care, Tylenol as needed, tramadol as needed  Hx CVA with residual left-sided weakness Evaluated by PT/OT with recommendations of outpatient therapy on discharge. --Continue aspirin and statin  HLD: Continue atorvastatin 80 mg p.o. daily  Essential hypertension:  --Continue lisinopril 10 mg p.o. daily, and atenolol 50 mg p.o.  daily --on statin and aspirin  Hypokalemia Potassium 3.7 today, will replete. --Repeat electrolytes in a.m.  Hypomagnesemia Magnesium 1.3 today, will replete --Repeat magnesium level in a.m.  Moderate protein calorie malnutrition Adult failure to thrive, weakness, deconditioning Body mass index is 19.99 kg/m. Nutrition Status: Nutrition Problem: Moderate Malnutrition Etiology: chronic illness(CVA) Signs/Symptoms: mild fat depletion, moderate fat depletion, mild muscle depletion, moderate muscle depletion, energy intake < or equal to 75% for > or equal to 1 month, percent weight loss Percent weight loss: 11 % Interventions: Refer to RD note for recommendations  --Nutrition following; diet liberalized to regular; currently undergoing 48h calorie count --PT/OT following; recommend outpatient PT/OT --Palliative care consultation for assistance with goals ofcare and medical decision making  Depression Patient was seen by psychiatry on 09/14/2019; no acute issues identified and no changes recommended in current outpatient treatment. --Continue Prozac 40 mg p.o. daily --Continue clonazepam 1mg  TID; but recommend follow-up with outpatient provider regarding tapering clonazepam to decrease symptoms of weakness and fatigue  GERD: Continue Protonix 40 mg p.o. daily   DVT prophylaxis: SCDs Code Status: Full code Family Communication: Attempted to update patient spouse via telephone this morning, unsuccessful with no answer  Disposition Plan:  Status is: Inpatient  Remains inpatient appropriate because:Persistent severe electrolyte disturbances, Unsafe d/c plan and IV treatments appropriate due to intensity of illness or inability to take PO   Dispo: The patient is from: Home              Anticipated d/c is to: Home              Anticipated d/c date is: 1 day              Patient currently is not medically stable to d/c.  Consultants:   [facility] gastroenterology, Dr.  Dulce Sellar  Psychiatry  Palliative care  Procedures:   None  Antimicrobials:   None   Subjective: Patient seen and examined bedside, resting comfortably.  Patient only with one bowel movement yesterday, abdominal pain remarkably better today.  Tolerating more oral intake this morning.  No family present at bedside, attempted to call patient spouse letter via telephone unsuccessful.  Nutrition continues with 48-hour calorie count.  Seen by psychiatry yesterday with no changes in medication management.  Pending palliative care evaluation today.  No other complaints this morning. Denies headache, no fever/chills/night sweats, no vomiting, no nausea, no chest pain, no palpitations, no shortness of breath.  No acute events overnight per nursing staff.  Objective: Vitals:   09/14/19 1930 09/15/19 0422 09/15/19 0456 09/15/19 0842  BP: 126/65 (!) 133/58  135/61  Pulse: 75 83  84  Resp: 18 19  18   Temp: 98.4 F (36.9 C) 98.5 F (36.9 C)    TempSrc: Oral Oral    SpO2: 96% 94%  96%  Weight:   48 kg   Height:        Intake/Output Summary (Last 24 hours) at 09/15/2019 1148 Last data filed at 09/15/2019 0900 Gross per 24 hour  Intake 600 ml  Output 750 ml  Net -150 ml   Filed Weights   09/12/19 0226 09/14/19 0500 09/15/19 0456  Weight: 40.5 kg 47.8 kg 48 kg    Examination:  General exam: Appears calm and comfortable, thin in appearance with muscle wasting Respiratory system: Clear to auscultation. Respiratory effort normal.  Oxygenating well on room air Cardiovascular system: S1 & S2 heard, RRR. No JVD, murmurs, rubs, gallops or clicks. No pedal edema. Gastrointestinal system: Abdomen is nondistended, soft and nontender. No organomegaly or masses felt. Normal bowel sounds heard. Central nervous system: Alert and oriented. No focal neurological deficits. Extremities: Symmetric 5 x 5 power. Skin: No rashes, lesions or ulcers Psychiatry: Judgement and insight appear normal.  Depressed  mood & flat affect.     Data Reviewed: I have personally reviewed following labs and imaging studies  CBC: Recent Labs  Lab 09/11/19 2103 09/12/19 0347 09/14/19 0251 09/15/19 0734  WBC 8.3 9.4 10.1 11.7*  NEUTROABS 6.4  --   --   --   HGB 7.8* 8.2* 7.3* 7.6*  HCT 25.0* 26.1* 23.6* 23.8*  MCV 90.3 89.4 89.4 86.9  PLT 248 260 240 289   Basic Metabolic Panel: Recent Labs  Lab 09/11/19 2103 09/12/19 0347 09/14/19 0251 09/15/19 0734  NA 140 142 135 137  K 4.1 3.8 3.4* 3.7  CL 107 109 105 104  CO2 23 23 23 24   GLUCOSE 100* 88 113* 101*  BUN 15 13 14 9   CREATININE 0.96 0.84 0.84 0.72  CALCIUM 8.3* 8.4* 7.8* 8.1*  MG  --  1.6*  --  1.3*  PHOS  --  2.9  --   --    GFR: Estimated Creatinine Clearance: 47.3 mL/min (by C-G formula based on SCr of 0.72 mg/dL). Liver Function Tests: Recent Labs  Lab 09/11/19 2103  AST 23  ALT 15  ALKPHOS 65  BILITOT 0.7  PROT 5.3*  ALBUMIN 1.8*   No results for input(s): LIPASE, AMYLASE in the last 168 hours. No results for input(s): AMMONIA in the last 168 hours. Coagulation Profile: No results for input(s): INR, PROTIME in the last 168 hours. Cardiac Enzymes: No results for input(s): CKTOTAL, CKMB, CKMBINDEX, TROPONINI in the last 168 hours. BNP (  last 3 results) No results for input(s): PROBNP in the last 8760 hours. HbA1C: No results for input(s): HGBA1C in the last 72 hours. CBG: No results for input(s): GLUCAP in the last 168 hours. Lipid Profile: No results for input(s): CHOL, HDL, LDLCALC, TRIG, CHOLHDL, LDLDIRECT in the last 72 hours. Thyroid Function Tests: No results for input(s): TSH, T4TOTAL, FREET4, T3FREE, THYROIDAB in the last 72 hours. Anemia Panel: No results for input(s): VITAMINB12, FOLATE, FERRITIN, TIBC, IRON, RETICCTPCT in the last 72 hours. Sepsis Labs: No results for input(s): PROCALCITON, LATICACIDVEN in the last 168 hours.  Recent Results (from the past 240 hour(s))  Respiratory Panel by RT PCR (Flu  A&B, Covid) - Nasopharyngeal Swab     Status: None   Collection Time: 09/11/19 11:52 PM   Specimen: Nasopharyngeal Swab  Result Value Ref Range Status   SARS Coronavirus 2 by RT PCR NEGATIVE NEGATIVE Final    Comment: (NOTE) SARS-CoV-2 target nucleic acids are NOT DETECTED. The SARS-CoV-2 RNA is generally detectable in upper respiratoy specimens during the acute phase of infection. The lowest concentration of SARS-CoV-2 viral copies this assay can detect is 131 copies/mL. A negative result does not preclude SARS-Cov-2 infection and should not be used as the sole basis for treatment or other patient management decisions. A negative result may occur with  improper specimen collection/handling, submission of specimen other than nasopharyngeal swab, presence of viral mutation(s) within the areas targeted by this assay, and inadequate number of viral copies (<131 copies/mL). A negative result must be combined with clinical observations, patient history, and epidemiological information. The expected result is Negative. Fact Sheet for Patients:  https://www.moore.com/ Fact Sheet for Healthcare Providers:  https://www.young.biz/ This test is not yet ap proved or cleared by the [place] FDA and  has been authorized for detection and/or diagnosis of SARS-CoV-2 by FDA under an Emergency Use Authorization (EUA). This EUA will remain  in effect (meaning this test can be used) for the duration of the COVID-19 declaration under Section 564(b)(1) of the Act, 21 U.S.C. section 360bbb-3(b)(1), unless the authorization is terminated or revoked sooner.    Influenza A by PCR NEGATIVE NEGATIVE Final   Influenza B by PCR NEGATIVE NEGATIVE Final    Comment: (NOTE) The Xpert Xpress SARS-CoV-2/FLU/RSV assay is intended as an aid in  the diagnosis of influenza from Nasopharyngeal swab specimens and  should not be used as a sole basis for treatment. Nasal washings  and  aspirates are unacceptable for Xpert Xpress SARS-CoV-2/FLU/RSV  testing. Fact Sheet for Patients: https://www.moore.com/ Fact Sheet for Healthcare Providers: https://www.young.biz/ This test is not yet approved or cleared by the [place] FDA and  has been authorized for detection and/or diagnosis of SARS-CoV-2 by  FDA under an Emergency Use Authorization (EUA). This EUA will remain  in effect (meaning this test can be used) for the duration of the  Covid-19 declaration under Section 564(b)(1) of the Act, 21  U.S.C. section 360bbb-3(b)(1), unless the authorization is  terminated or revoked. Performed at [facility], [address], [place], [place] [address]   Gastrointestinal Panel by PCR , Stool     Status: None   Collection Time: 09/12/19  2:33 AM   Specimen: Stool  Result Value Ref Range Status   Campylobacter species NOT DETECTED NOT DETECTED Final   Plesimonas shigelloides NOT DETECTED NOT DETECTED Final   Salmonella species NOT DETECTED NOT DETECTED Final   Yersinia enterocolitica NOT DETECTED NOT DETECTED Final   Vibrio species NOT  DETECTED NOT DETECTED Final   Vibrio cholerae NOT DETECTED NOT DETECTED Final   Enteroaggregative E coli (EAEC) NOT DETECTED NOT DETECTED Final   Enteropathogenic E coli (EPEC) NOT DETECTED NOT DETECTED Final   Enterotoxigenic E coli (ETEC) NOT DETECTED NOT DETECTED Final   Shiga like toxin producing E coli (STEC) NOT DETECTED NOT DETECTED Final   Shigella/Enteroinvasive E coli (EIEC) NOT DETECTED NOT DETECTED Final   Cryptosporidium NOT DETECTED NOT DETECTED Final   Cyclospora cayetanensis NOT DETECTED NOT DETECTED Final   Entamoeba histolytica NOT DETECTED NOT DETECTED Final   Giardia lamblia NOT DETECTED NOT DETECTED Final   Adenovirus F40/41 NOT DETECTED NOT DETECTED Final   Astrovirus NOT DETECTED NOT DETECTED Final   Norovirus GI/GII NOT DETECTED NOT DETECTED Final   Rotavirus A NOT  DETECTED NOT DETECTED Final   Sapovirus (I, II, IV, and V) NOT DETECTED NOT DETECTED Final    Comment: Performed at [facility], [address] [place], [place] [address]  C Difficile Quick Screen w PCR reflex     Status: None   Collection Time: 09/13/19 11:50 AM   Specimen: STOOL  Result Value Ref Range Status   C Diff antigen NEGATIVE NEGATIVE Final   C Diff toxin NEGATIVE NEGATIVE Final   C Diff interpretation No C. difficile detected.  Final    Comment: Performed at [facility], [address] [place], [place] [address]         Radiology Studies: No results found.      Scheduled Meds: . aspirin EC  325 mg Oral Daily  . atenolol  50 mg Oral Daily  . atorvastatin  80 mg Oral QPM  . cholestyramine  4 g Oral BID  . feeding supplement (ENSURE ENLIVE)  237 mL Oral BID BM  . ferrous sulfate  325 mg Oral BID WC  . FLUoxetine  40 mg Oral Daily  . lipase/protease/amylase  36,000 Units Oral TID WC  . lisinopril  10 mg Oral Daily  . multivitamin with minerals  1 tablet Oral Daily  . pantoprazole  40 mg Oral Daily  . sodium chloride flush  3 mL Intravenous Q12H   Continuous Infusions:    LOS: 4 days    Time spent: 34 minutes spent on chart review, discussion with nursing staff, consultants, updating family and interview/physical exam; more than 50% of that time was spent in counseling and/or coordination of care.    Zoeya Gramajo J [place] DO [facility] Available via Epic secure chat 7am-7pm After these hours, please refer to coverage provider listed on [email or web address] 09/15/2019, 11:48 AM

## 2019-09-15 NOTE — TOC Initial Note (Signed)
Transition of Care [facility]) - Initial/Assessment Note    Patient Details  Name: Patricia Stanley MRN: [id number] Date of Birth: 09/16/1945  Transition of [facility]) CM/SW Contact:    Curlene Labrum, RN Phone Number: 09/15/2019, 4:32 PM  Clinical Narrative:                 Case management met with the patient and husband at the bedside with C/O malaise, weakness, pain in legs and arms, and diarrhea.  The patient and family just met with the Palliative care team for support.  The patient lives with her husband and daughter at the home and has a rolling walker and 3:1 at the house.  The patient's husband states that the patient has had increasingly difficulty recovering from her CVA in 2020 that has left her with left sided weakness.  The patient is waiting on PT evaluation and I will continue to follow the patient for discharge and/or home health needs.  Expected Discharge Plan: [place] Barriers to Discharge: Continued Medical Work up   Patient Goals and CMS Choice Patient states their goals for this hospitalization and ongoing recovery are:: I'm weak and trying to feel better. CMS Medicare.gov Compare Post Acute Care list provided to:: Patient Represenative (must comment) Choice offered to / list presented to : Spouse  Expected Discharge Plan and Services Expected Discharge Plan: [place] In-house Referral: Hospice / Palliative Care Discharge Planning Services: CM Consult   Living arrangements for the past 2 months: Single Family Home                                      Prior Living Arrangements/Services Living arrangements for the past 2 months: Single Family Home   Patient language and need for interpreter reviewed:: Yes Do you feel safe going back to the place where you live?: Yes      Need for Family Participation in Patient Care: Yes (Comment) Care giver support system in place?: Yes (comment) Current home services:  DME(has rolling walker and 3:1 at home.) Criminal Activity/Legal Involvement Pertinent to Current Situation/Hospitalization: No - Comment as needed  Activities of Daily Living Home Assistive Devices/Equipment: Gilford Rile (specify type) ADL Screening (condition at time of admission) Patient's cognitive ability adequate to safely complete daily activities?: Yes Is the patient deaf or have difficulty hearing?: No Does the patient have difficulty seeing, even when wearing glasses/contacts?: No Does the patient have difficulty concentrating, remembering, or making decisions?: No Patient able to express need for assistance with ADLs?: No Does the patient have difficulty dressing or bathing?: Yes Independently performs ADLs?: No Does the patient have difficulty walking or climbing stairs?: Yes Weakness of Legs: Both Weakness of Arms/Hands: None  Permission Sought/Granted Permission sought to share information with : Case Manager Permission granted to share information with : Yes, Verbal Permission Granted        Permission granted to share info w Relationship: husband, [name]     Emotional Assessment Appearance:: Appears stated age Attitude/Demeanor/Rapport: Gracious Affect (typically observed): Accepting Orientation: : Oriented to Self, Oriented to Place, Oriented to  Time, Oriented to Situation Alcohol / Substance Use: Not Applicable Psych Involvement: No (comment)  Admission diagnosis:  Diarrhea [R19.7] Chronic diarrhea [K52.9] FTT (failure to thrive) in adult [R62.7] Anemia, unspecified type [D64.9] Patient Active Problem List   Diagnosis Date Noted  . Palliative care by specialist   .  Goals of care, counseling/discussion   . DNR (do not resuscitate)   . Anemia 09/12/2019  . Physical deconditioning 09/12/2019  . Malnutrition of moderate degree 09/12/2019  . Diarrhea 09/11/2019  . Moderate episode of recurrent major depressive disorder ([place])   . Small vessel cerebrovascular  accident (CVA) ([place]) 04/25/2019  . Acute CVA (cerebrovascular accident) ([place]) 04/18/2019  . Weight loss, unintentional 04/18/2019  . Anxiety 04/18/2019  . Major depression in partial remission ([place]) 08/25/2012  . Osteoporosis, unspecified 08/25/2012  . HTN (hypertension) 08/25/2012  . Fall at home 08/24/2012  . Pelvic fracture ([place]) 08/24/2012  . Migraines 08/24/2012  . Renal insufficiency, mild 08/24/2012   PCP:  Josetta Huddle, MD Pharmacy:   [id number] [place] [place]Phone: [phone]: [phone]    Social Determinants of Health (SDOH) Interventions    Readmission Risk Interventions Readmission Risk Prevention Plan 09/15/2019  Transportation Screening Complete  PCP or Specialist Appt within 5-7 Days Complete  Home Care Screening Complete  Medication Review (RN CM) Complete  Some recent data might be hidden

## 2019-09-15 NOTE — Consult Note (Signed)
Palliative Medicine Inpatient Consult Note  Reason for consult:  Goals of care "59F admit with AFTT.  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  Palliative care was asked to get involved to help in goals of care conversations give the patients persistent failure to thrive.   Clinical Assessment/Goals of Care: I have reviewed medical records including EPIC notes, labs and imaging, received report from bedside RN, assessed the patient. Patient is lying in bed at the time of assessment in no distress.    I met with Patricia Stanley and Patricia Stanley (spouse) to further discuss diagnosis prognosis, GOC, EOL wishes, disposition and options.   I introduced Palliative Medicine as specialized medical care for people living with serious illness. It focuses on providing relief from the symptoms and stress of a serious illness. The goal is to improve quality of life for both the patient and the family.  I asked [name] to tell me about herself. [name] shares that she is from [place] and has lived in [place] for the past fifty years. She is married to [name] who she has been with for fifty-five years. They share two daughters together Patricia Stanley and Patricia Stanley. They have five grandchildren or "youngens". The patient shares that things which bring her joy are her family. She is a religious woman and a member of the [employer] denomination.   Patient was no performing any of her bADL's independently. Her husband shares that she has been dependant on him for quite sometime even prior to her stroke in December. He said that she has always  had a lack of motivation to do things for herself. More recently she has become more debilitated from the perspective of her bladder and bowels which the patient worries poses an imposition on Patricia Stanley and her daughter, Patricia Stanley who lives with them.   I asked [name] how her health had been. She states that it has been poor for sometime now. She feels that since her stroke in December she has truly declined physically and mentally. Allowed a safe space for her to voice her concerns. She shares that she feels weak and tired all of the time. She shares that she has new pain in her extremities and head that she never had before. In regard to her appetite she says that she feels nauseated on a constant basis.   We talked for quite sometime about Patricia Stanley's depression which she has battled for over twenty years. Patricia Stanley said that she was placed on Prozac years ago but she and the patient never really noticed an improvement. Macey self discontinued this about a year ago. She was then placed back on it in January but again, no difference has been made. She states that her clonazepam does help with her anxiety. Years ago a provider discontinued it abruptly and the patient experienced seizures. She shares that she is okay with only taking a morning and evening dose. Patricia Stanley says sometimes they have to give her a third dose but those occurences are rare.   A detailed discussion was had today regarding advanced directives, Patricia Stanley states that Patricia Stanley would be her decision maker if she were incapacitated.    Concepts specific to  code status, artifical feeding and hydration, continued IV antibiotics and rehospitalization was had.  [name] stated that she would not want extraordinary means to sustain life. She would not want CPR or intubation and would ideally want to die naturally.   The difference between a aggressive medical intervention path  and a palliative comfort care path for this patient at this time was had. We talked quite  a bit about quality of life. Patient states that she feels like her quality has diminished quite a bit in the setting of her recent stroke. We talked about the fatigue she is feeling and self motivation strategies to initiating activities.   We discussed her present situation and if she should worsen. Described hospice as a service for patients with < 6 months to live. The focus is on dignity and quality at the end of life. We discussed that if her situation should worsen this would be a reasonable consideration though to try and treat her underlying ailments at the present time. Patricia Stanley and her husband are both open to OP Palliative care upon discharge.   Discussed the importance of continued conversation with family and their  medical providers regarding overall plan of care and treatment options, ensuring decisions are within the context of the patients values and GOCs.  Decision Maker: Patient can make decisions for herself  SUMMARY OF RECOMMENDATIONS   DNAR/DNI  Symptom management as below  Chaplain Consult  TOC --> OP Palliative care consult  Code Status/Advance Care Planning: DNAR/DNI   Symptom Management:  Failure to Thrive:  - Dietician involved, getting calorie count presently  - Encourage 1:1 feeding  - Supplemental nutrients  Depression:  - Stop Prozac  - Start mirtazapine 28m PO QHS  Generalized Pain:  - Tylenol 1g PO TID  Nausea:  - Zofran 434mPO TID 30 minutes before meals  - Will obtain BL EKG for Qtc interval    Muscular Weakness:                 - Physical Therapy Evaluation                 - Occupational Therapy Evaluation   Xerostomia:                 - Good oral care QShift                 - Encourage liquid intake   Spiritual:                 - Chaplain consult  Palliative Prophylaxis:   Delirium, oral care, Turn Q2H  Additional Recommendations (Limitations, Scope, Preferences):  Treat what is treatable   Psycho-social/Spiritual:    Desire for further Chaplaincy support: Yes  Additional Recommendations: Education on hospice care   Prognosis: Will depend likely quite poor in the setting of ongoing failure to thrive.  Discharge Planning: Discharge to home with home health  Review of Systems  Neurological: Positive for weakness.  Psychiatric/Behavioral: Positive for depression. The patient is nervous/anxious and has insomnia.     Vitals with BMI 09/15/2019 09/15/2019 09/15/2019  Height - - -  Weight - 105 lbs 13 oz -  BMI - 20 -  Systolic 13175 13102Diastolic 61 - 58  Pulse 84 - 83   Physical Exam  Constitutional: She is oriented to person, place, and time.  Elderly cachetic female  HENT:  Head: Normocephalic.  Eyes: Pupils are equal, round, and reactive to light.  Cardiovascular: Normal rate.  Pulmonary/Chest: Effort normal.  Abdominal: Soft.  Musculoskeletal:     Cervical back: Normal range of motion.  Neurological: She is alert and oriented to person, place, and time.  Hard of hearing  Skin: Skin is dry. There is pallor.  Psychiatric:  Withdrawn   PPS: 30%    This conversation/these recommendations were discussed with patient primary care team, Dr. [place]  Time In: 1030 Time Out: 1150 Total Time: 80 Greater than 50%  of this time was spent counseling and coordinating care related to the above assessment and plan.  [place] Team Team Cell Phone: [phone] Please utilize secure chat with additional questions, if there is no response within 30 minutes please call the above phone number  Palliative Medicine Team providers are available by phone from 7am to 7pm daily and can be reached through the team cell phone.  Should this patient require assistance outside of these hours, please call the patient's attending physician.

## 2019-09-15 NOTE — Progress Notes (Signed)
DNR ordered. Placed DNR armband to rt wrist.

## 2019-09-15 NOTE — Progress Notes (Signed)
Physical Therapy Treatment Patient Details Name: RENNEE Stanley MRN: [id number] DOB: [birth year] Today's Date: 09/15/2019    History of Present Illness Pt is a 74 y.o. female with PMH of CVA (04/2019) with residual left sided deficits, CAS s/p R CEA (04/2019), MDD, HTN, HLD, who presents 09/11/19 with weakness and diarrhea. Workup for anemia and failure to thrive.   PT Comments    Pt progressing with mobility. Able to progress ambulation distance with RW and minA, requires multiple seated rest breaks secondary to fatigue. Remains limited by generalized weakness and decreased activity tolerance; at high risk for falls. Pt c/o dizziness while walking, BP 131/64. Husband present and supportive; reports family able to continue providing necessary physical assist upon return home. No DME needs. Will continue to follow acutely to address established goals.    Follow Up Recommendations  Outpatient PT;Supervision/Assistance - 24 hour     Equipment Recommendations  None recommended by PT    Recommendations for Other Services       Precautions / Restrictions Precautions Precautions: Fall Restrictions Weight Bearing Restrictions: No    Mobility  Bed Mobility Overal bed mobility: Needs Assistance Bed Mobility: Supine to Sit     Supine to sit: Min assist     General bed mobility comments: MinA for UE support to scoot hips to EOB  Transfers Overall transfer level: Needs assistance Equipment used: Rolling walker (2 wheeled) Transfers: Sit to/from Stand Sit to Stand: Min assist;Min guard         General transfer comment: MinA to elevate trunk and maintain stability standing from EOB and recliner, stood 2x more from recliner with min guard when cued to push up from armrests  Ambulation/Gait Ambulation/Gait assistance: Min assist Gait Distance (Feet): 56 Feet Assistive device: Rolling walker (2 wheeled) Gait Pattern/deviations: Step-through pattern;Decreased stride length;Decreased  dorsiflexion - left Gait velocity: Decreased   General Gait Details: Amb 85' + 20' + 20' with seated rest breaks in between due to fatigue and c/o dizziness (BP 131/64). Use of RW and frequent minA on L-side to maintain balance. L foot drop   Stairs             Wheelchair Mobility    Modified Rankin (Stroke Patients Only)       Balance Overall balance assessment: Needs assistance Sitting-balance support: No upper extremity supported;Feet supported;Single extremity supported Sitting balance-Leahy Scale: Fair       Standing balance-Leahy Scale: Poor Standing balance comment: Reliant on UE support                            Cognition Arousal/Alertness: Awake/alert Behavior During Therapy: WFL for tasks assessed/performed Overall Cognitive Status: History of cognitive impairments - at baseline Area of Impairment: Attention;Memory;Following commands;Problem solving                   Current Attention Level: Sustained Memory: Decreased short-term memory Following Commands: Follows one step commands consistently;Follows one step commands with increased time     Problem Solving: Requires verbal cues        Exercises      General Comments General comments (skin integrity, edema, etc.): Husband present and supportive; reports feels comfortable to continue providing necessary physical assist pt will require upon return home      Pertinent Vitals/Pain Pain Assessment: No/denies pain    Home Living  Prior Function            PT Goals (current goals can now be found in the care plan section) Progress towards PT goals: Progressing toward goals    Frequency    Min 3X/week      PT Plan Current plan remains appropriate    Co-evaluation              AM-PAC PT "6 Clicks" Mobility   Outcome Measure  Help needed turning from your back to your side while in a flat bed without using bedrails?: A  Little Help needed moving from lying on your back to sitting on the side of a flat bed without using bedrails?: A Little Help needed moving to and from a bed to a chair (including a wheelchair)?: A Little Help needed standing up from a chair using your arms (e.g., wheelchair or bedside chair)?: A Little Help needed to walk in hospital room?: A Little Help needed climbing 3-5 steps with a railing? : A Lot 6 Click Score: 17    End of Session Equipment Utilized During Treatment: Gait belt Activity Tolerance: Patient tolerated treatment well Patient left: in chair;with call bell/phone within reach;with chair alarm set;with family/visitor present Nurse Communication: Mobility status PT Visit Diagnosis: Unsteadiness on feet (R26.81);Muscle weakness (generalized) (M62.81);Difficulty in walking, not elsewhere classified (R26.2)     Time: 3818-2993 PT Time Calculation (min) (ACUTE ONLY): 25 min  Charges:  $Therapeutic Exercise: 8-22 mins $Therapeutic Activity: 8-22 mins                    Patricia Stanley, PT, DPT Acute Rehabilitation Services  Pager [phone] Office [place] 09/15/2019, 5:09 PM

## 2019-09-16 LAB — CBC
HCT: 22.8 % — ABNORMAL LOW (ref 36.0–46.0)
Hemoglobin: 7.2 g/dL — ABNORMAL LOW (ref 12.0–15.0)
MCH: 28 pg (ref 26.0–34.0)
MCHC: 31.6 g/dL (ref 30.0–36.0)
MCV: 88.7 fL (ref 80.0–100.0)
Platelets: 276 10*3/uL (ref 150–400)
RBC: 2.57 MIL/uL — ABNORMAL LOW (ref 3.87–5.11)
RDW: 15.3 % (ref 11.5–15.5)
WBC: 12.8 10*3/uL — ABNORMAL HIGH (ref 4.0–10.5)
nRBC: 0 % (ref 0.0–0.2)

## 2019-09-16 LAB — BASIC METABOLIC PANEL
Anion gap: 6 (ref 5–15)
BUN: 13 mg/dL (ref 8–23)
CO2: 24 mmol/L (ref 22–32)
Calcium: 7.8 mg/dL — ABNORMAL LOW (ref 8.9–10.3)
Chloride: 106 mmol/L (ref 98–111)
Creatinine, Ser: 0.72 mg/dL (ref 0.44–1.00)
GFR calc Af Amer: >60 mL/min (ref >60)
GFR calc non Af Amer: >60 mL/min (ref >60)
Glucose, Bld: 99 mg/dL (ref 70–99)
Potassium: 3.9 mmol/L (ref 3.5–5.1)
Sodium: 136 mmol/L (ref 135–145)

## 2019-09-16 LAB — MAGNESIUM: Magnesium: 2.2 mg/dL (ref 1.7–2.4)

## 2019-09-16 MED ORDER — MIRTAZAPINE 15 MG PO TABS: 15.0000 mg | ORAL_TABLET | Freq: Every day | ORAL | 0 refills | Status: AC

## 2019-09-16 MED ORDER — CHOLESTYRAMINE 4 G PO PACK: 4.0000 g | PACK | Freq: Two times a day (BID) | ORAL | 0 refills | Status: DC

## 2019-09-16 MED ORDER — FERROUS SULFATE 325 (65 FE) MG PO TABS: 325.0000 mg | ORAL_TABLET | Freq: Two times a day (BID) | ORAL | 0 refills | Status: AC

## 2019-09-16 MED ORDER — ONDANSETRON HCL 4 MG PO TABS: 4.0000 mg | ORAL_TABLET | Freq: Three times a day (TID) | ORAL | 2 refills | Status: DC

## 2019-09-16 MED ORDER — PANCRELIPASE (LIP-PROT-AMYL) 36000-114000 UNITS PO CPEP: 36000.0000 [IU] | ORAL_CAPSULE | Freq: Three times a day (TID) | ORAL | 0 refills | Status: DC

## 2019-09-16 MED ORDER — LOPERAMIDE HCL 2 MG PO CAPS: 2.0000 mg | ORAL_CAPSULE | ORAL | 0 refills | Status: DC | PRN

## 2019-09-16 NOTE — Care Management (Signed)
    Durable Medical Equipment  (From admission, onward)         Start     Ordered   09/16/19 1314  For home use only DME lightweight manual wheelchair with seat cushion  Once    Comments: Patient suffers from generalized weakness, deconditioning, anemia, palliative care support which impairs their ability to perform daily activities like ADLs:20651 in the home.  A walking aid:20652 will not resolve  issue with performing activities of daily living. A wheelchair will allow patient to safely perform daily activities. Patient is not able to propel themselves in the home using a standard weight wheelchair due to weakness:20653. Patient can self propel in the lightweight wheelchair. Length of need lifetime. - Please deliver 14 inch wheelchair Accessories: elevating leg rests (ELRs), wheel locks, extensions and anti-tippers.   09/16/19 1315   09/16/19 1313  For home use only DME Hospital bed  Once    Question Answer Comment  Length of Need Lifetime   Bed type Semi-electric   Support Surface: Gel Overlay      09/16/19 1315         

## 2019-09-16 NOTE — Progress Notes (Signed)
Occupational Therapy Treatment Patient Details Name: Patricia Stanley MRN: [id number] DOB: 07/31/1945 Today's Date: 09/16/2019    History of present illness Pt is a 74 y.o. female with PMH of CVA (04/2019) with residual left sided deficits, CAS s/p R CEA (04/2019), MDD, HTN, HLD, who presents 09/11/19 with weakness and diarrhea. Workup for anemia and failure to thrive.   OT comments  Pt seen after PT.  Spouse reporting pt with increased fatigue.  Initially she demonstrated difficulty maintaining arousal, but as she was engaged in ADL activity, she perked up.  She was able to complete sponge bath with min guard assist.  Spouse prefers to take pt home.  Recommend home with HHOT/PT, w/c and hospital bed for home use.   Follow Up Recommendations  Home health OT;Supervision/Assistance - 24 hour    Equipment Recommendations  Wheelchair (measurements OT);Hospital bed(14" w/c )    Recommendations for Other Services      Precautions / Restrictions Precautions Precautions: Fall Restrictions Weight Bearing Restrictions: No       Mobility Bed Mobility Overal bed mobility: Needs Assistance Bed Mobility: Supine to Sit     Supine to sit: Mod assist;Max assist     General bed mobility comments: Pt up in chair   Transfers Overall transfer level: Needs assistance Equipment used: Rolling walker (2 wheeled) Transfers: Sit to/from Stand Sit to Stand: Min guard Stand pivot transfers: Min guard       General transfer comment: min guard assist for balance     Balance Overall balance assessment: Needs assistance Sitting-balance support: No upper extremity supported;Feet supported;Single extremity supported Sitting balance-Leahy Scale: Good Sitting balance - Comments: posterior bias with posterior pevic tilt   Standing balance support: During functional activity;Single extremity supported Standing balance-Leahy Scale: Poor Standing balance comment: reliant on UE support                             ADL either performed or assessed with clinical judgement   ADL Overall ADL's : Needs assistance/impaired Eating/Feeding: Set up;Sitting       Upper Body Bathing: Set up;Supervision/ safety;Sitting   Lower Body Bathing: Min guard;Sit to/from stand                       Functional mobility during ADLs: Minimal assistance General ADL Comments: pt incontinent of urine      Vision       Perception     Praxis      Cognition Arousal/Alertness: Awake/alert;Lethargic Behavior During Therapy: Flat affect Overall Cognitive Status: History of cognitive impairments - at baseline                                 General Comments: husband reports pt more lethargic today than on previous days         Exercises     Shoulder Instructions       General Comments spouse present and concerned about managing her at home given increased lethargy today.  Discussed options of SNF, vs. home with hospital bed and w/c.  He prefers home as he does not want pt placed in SNF at this time     Pertinent Vitals/ Pain       Pain Assessment: Faces Pain Score: 8  Faces Pain Scale: Hurts little more Pain Location: generalized  Pain Descriptors / Indicators: Aching Pain Intervention(s): Monitored  during session  Home Living                                          Prior Functioning/Environment              Frequency  Min 2X/week        Progress Toward Goals  OT Goals(current goals can now be found in the care plan section)  Progress towards OT goals: Progressing toward goals  Acute Rehab OT Goals Patient Stated Goal: to get stronger  Plan Discharge plan needs to be updated;Equipment recommendations need to be updated    Co-evaluation                 AM-PAC OT "6 Clicks" Daily Activity     Outcome Measure   Help from another person eating meals?: A Little Help from another person taking care of personal  grooming?: A Little Help from another person toileting, which includes using toliet, bedpan, or urinal?: A Little Help from another person bathing (including washing, rinsing, drying)?: A Little Help from another person to put on and taking off regular upper body clothing?: A Little Help from another person to put on and taking off regular lower body clothing?: A Little 6 Click Score: 18    End of Session Equipment Utilized During Treatment: Gait belt;Rolling walker  OT Visit Diagnosis: Unsteadiness on feet (R26.81);Cognitive communication deficit (R41.841)   Activity Tolerance Patient tolerated treatment well   Patient Left in chair;with call bell/phone within reach;with chair alarm set   Nurse Communication Mobility status        Time: 9629-5284 OT Time Calculation (min): 32 min  Charges: OT General Charges $OT Visit: 1 Visit OT Treatments $Self Care/Home Management : 23-37 mins  Nilsa Nutting OTR/L Acute Rehabilitation Services Pager [phone] Office [phone]    Lucille Passy M 09/16/2019, 3:59 PM

## 2019-09-16 NOTE — Progress Notes (Addendum)
Physical Therapy Treatment Patient Details Name: Patricia Stanley MRN: [id number] DOB: Apr 06, 1946 Today's Date: 09/16/2019    History of Present Illness Pt is a 74 y.o. female with PMH of CVA (04/2019) with residual left sided deficits, CAS s/p R CEA (04/2019), MDD, HTN, HLD, who presents 09/11/19 with weakness and diarrhea. Workup for anemia and failure to thrive.    PT Comments    Pt sidelying in bed lying on her L side.  She reports pain in her left arm and HA.  Pt is requiring increased assistance today and husband is concerned with her presentation.  Based on her presentation will update recommendations to SNF at this time.  Husband wants to take patient home vs. SNF.  She will require HHPT and equipment listed below.  Will inform supervising PT of need for change in recommendations at this time.   Follow Up Recommendations  SNF;Supervision/Assistance - 24 hour(Husband and patient refusing SNF and will require HHPT.)     Equipment Recommendations  Hospital bed;Wheelchair (measurements PT);Wheelchair cushion (measurements PT)(ambulance transport home.)    Recommendations for Other Services       Precautions / Restrictions Precautions Precautions: Fall Restrictions Weight Bearing Restrictions: No    Mobility  Bed Mobility Overal bed mobility: Needs Assistance Bed Mobility: Supine to Sit     Supine to sit: Mod assist;Max assist     General bed mobility comments: Mod assistance to advance LEs and max assistance to elevate trunk and scoot hips to edge of bed.  Pt presents with posterior bias in sitting.  Transfers Overall transfer level: Needs assistance Equipment used: Rolling walker (2 wheeled)   Sit to Stand: Min assist;Mod assist         General transfer comment: min assistance from elevated bed height and mod assistance from recliner chair with max VCs for hand placement to and from seated surface.  Ambulation/Gait Ambulation/Gait assistance: Mod assist Gait  Distance (Feet): 4 Feet(+ 6 ft) Assistive device: Rolling walker (2 wheeled);1 person hand held assist Gait Pattern/deviations: Step-through pattern;Decreased stride length;Decreased dorsiflexion - left;Staggering left;Staggering right Gait velocity: Decreased   General Gait Details: Pt more fatigued today and required increased assistance to mobilize.  Pt performed 4 ft trial with RW, 3 ft to stairs with device and 45ft back with HHA on L.  Pt with poor balance and required moderate assistance for safety to maintain balance.   Stairs Stairs: Yes Stairs assistance: Mod assist Stair Management: Step to pattern;Forwards Number of Stairs: 2 General stair comments: Cues for sequencing.  Pt able to follow commands to lead with her good foot going up.  She followed commands for descending the first stair but husband reports she goes down with her good leg at home.  He did not seem receptive when PTA attempted to educate on correct sequencing with rationale.   Wheelchair Mobility    Modified Rankin (Stroke Patients Only)       Balance Overall balance assessment: Needs assistance Sitting-balance support: No upper extremity supported;Feet supported;Single extremity supported Sitting balance-Leahy Scale: Poor Sitting balance - Comments: posterior bias with posterior pevic tilt   Standing balance support: Bilateral upper extremity supported Standing balance-Leahy Scale: Poor Standing balance comment: Reliant on UE support and external assistance to maintain balance.                            Cognition Arousal/Alertness: Lethargic Behavior During Therapy: Flat affect Overall Cognitive Status: History of cognitive impairments -  at baseline                                 General Comments: Husband reports patient is the weakest she has been this hospital stay.  Pt is very lethargic with diificulty keeping eyes open.  Difficult to assess cognition formally.       Exercises      General Comments        Pertinent Vitals/Pain Pain Assessment: 0-10 Pain Score: 8  Pain Location: L wrist and head ache Pain Descriptors / Indicators: Aching Pain Intervention(s): Monitored during session;Repositioned;Premedicated before session    Home Living                      Prior Function            PT Goals (current goals can now be found in the care plan section) Acute Rehab PT Goals Patient Stated Goal: to get stronger Potential to Achieve Goals: Fair Progress towards PT goals: Progressing toward goals    Frequency    Min 3X/week      PT Plan Discharge plan needs to be updated    Co-evaluation              AM-PAC PT "6 Clicks" Mobility   Outcome Measure  Help needed turning from your back to your side while in a flat bed without using bedrails?: A Lot Help needed moving from lying on your back to sitting on the side of a flat bed without using bedrails?: A Lot Help needed moving to and from a bed to a chair (including a wheelchair)?: A Lot Help needed standing up from a chair using your arms (e.g., wheelchair or bedside chair)?: A Lot Help needed to walk in hospital room?: A Lot Help needed climbing 3-5 steps with a railing? : A Lot 6 Click Score: 12    End of Session Equipment Utilized During Treatment: Gait belt Activity Tolerance: Patient limited by lethargy Patient left: (left in reclienr reclined with OT present to start OT session.) Nurse Communication: Mobility status(poor activity tolerance.) PT Visit Diagnosis: Unsteadiness on feet (R26.81);Muscle weakness (generalized) (M62.81);Difficulty in walking, not elsewhere classified (R26.2)     Time: 1210-1229 PT Time Calculation (min) (ACUTE ONLY): 19 min  Charges:  $Gait Training: 8-22 mins                     Erasmo Leventhal , PTA Acute Rehabilitation Services Pager [phone] Office [place] 09/16/2019, 1:13 PM

## 2019-09-16 NOTE — Progress Notes (Signed)
[facility] Progress Note  Patricia Stanley 74 y.o. 1946/04/30  CC:  Diarrhea, failure to thrive  Subjective: Patient reports feeling weak this morning.  She just ate breakfast of pancakes with syrup and bacon.  Notes mild epigastric discomfort but denies any nausea or vomiting.  One loose bowel movement yesterday but no bowel movement so far today.  Denies any melena or hematemesis.  ROS : Review of Systems  Constitutional: Positive for malaise/fatigue. Negative for fever.  Respiratory: Negative for cough and shortness of breath.   Gastrointestinal: Positive for abdominal pain (mild, epigastric) and diarrhea. Negative for blood in stool, constipation, heartburn, melena, nausea and vomiting.    Objective: Vital signs in last 24 hours: Vitals:   09/16/19 0233 09/16/19 0731  BP: (!) 117/58 (!) 124/52  Pulse: 77 85  Resp: 16 16  Temp: 98.1 F (36.7 C) 97.9 F (36.6 C)  SpO2: 90% 93%    Physical Exam:  General:  Alert, cooperative, no distress, thin, sitting up in bed comfortably  Head:  Normocephalic, without obvious abnormality, atraumatic  Eyes:  Conjunctival pallor, EOMs intact  Lungs:   Clear to auscultation bilaterally, respirations unlabored  Heart:  Regular rate and rhythm, S1, S2 normal  Abdomen:   Soft, thin, mild epigastric tenderness, bowel sounds active all four quadrants,  no peritoneal signs  Extremities: Extremities normal, atraumatic, no  edema  Pulses: 2+ and symmetric    Lab Results: Recent Labs    09/15/19 0734 09/16/19 0248  NA 137 136  K 3.7 3.9  CL 104 106  CO2 24 24  GLUCOSE 101* 99  BUN 9 13  CREATININE 0.72 0.72  CALCIUM 8.1* 7.8*  MG 1.3* 2.2   No results for input(s): AST, ALT, ALKPHOS, BILITOT, PROT, ALBUMIN in the last 72 hours. Recent Labs    09/15/19 0734 09/16/19 0248  WBC 11.7* 12.8*  HGB 7.6* 7.2*  HCT 23.8* 22.8*  MCV 86.9 88.7  PLT 289 276   No results for input(s): LABPROT, INR in the last 72  hours.  Assessment: Acute on chronic diarrhea: etiology unknown.  Improving on pancreatic enzymes, cholestyramine, and PRN Immodium.  GI pathogen panel and C. diff negative.  Abdominal films 5/11 showed no acute findings, no excessive colonic stool burden to suggest overflow.  Failure to thrive: calorie count in place  Acute on chronic anemia: Hgb stable, 7.2 today.  No signs of GI bleeding.  Low iron (15) and TIBC (169), normal ferritin as of 09/12/19.  B12 and folate WNL as of 09/12/19.  FOBT negative 09/11/19.  Abnormal CT 04/21/19: esophageal thickening (lower 1/3), possibly secondary to esophagitis; No esophageal mass lesion evident per esophagram 04/22/19.  Patient has a history of GERD  Plan: Continue Creon 36,000u TID, cholestyramine, and PRN immodium.  Continue Protonix 40mg  qd.  Discussed with Dr. .  Endoscopic evaluation deferred at this time due to patient's frailty and no current signs of GI bleeding.   Recommend outpatient follow up in 2-3 months.  [facility] will sign off.  Please contact Dulce Sellar if we can be of any further assistance during this hospital stay.   [place] PA-C 09/16/2019, 9:32 AM  Contact #  [phone]

## 2019-09-16 NOTE — Plan of Care (Signed)
  Problem: Health Behavior/Discharge Planning: Goal: Ability to manage health-related needs will improve Outcome: Progressing   Problem: Clinical Measurements: Goal: Ability to maintain clinical measurements within normal limits will improve Outcome: Progressing   Problem: Activity: Goal: Risk for activity intolerance will decrease Outcome: Progressing   Problem: Nutrition: Goal: Adequate nutrition will be maintained Outcome: Progressing   Problem: Coping: Goal: Level of anxiety will decrease Outcome: Progressing   Problem: Coping: Goal: Level of anxiety will decrease Outcome: Progressing   Problem: Elimination: Goal: Will not experience complications related to bowel motility Outcome: Progressing   Problem: Pain Managment: Goal: General experience of comfort will improve Outcome: Progressing   Problem: Safety: Goal: Ability to remain free from injury will improve Outcome: Progressing   Problem: Skin Integrity: Goal: Risk for impaired skin integrity will decrease Outcome: Progressing

## 2019-09-16 NOTE — Care Management (Signed)
    Durable Medical Equipment  (From admission, onward)         Start     Ordered   09/16/19 1314  For home use only DME lightweight manual wheelchair with seat cushion  Once    Comments: Patient suffers from generalized weakness, deconditioning, anemia, palliative care support which impairs their ability to perform daily activities like [id number] in the home.  A walking [id number] will not resolve  issue with performing activities of daily living. A wheelchair will allow patient to safely perform daily activities. Patient is not able to propel themselves in the home using a standard weight wheelchair due to [id number]. Patient can self propel in the lightweight wheelchair. Length of need lifetime. - Please deliver 14 inch wheelchair Accessories: elevating leg rests (ELRs), wheel locks, extensions and anti-tippers.   09/16/19 1315   09/16/19 1313  For home use only DME Hospital bed  Once    Question Answer Comment  Length of Need Lifetime   Bed type Semi-electric   Support Surface: Gel Overlay      09/16/19 1315

## 2019-09-16 NOTE — Discharge Summary (Signed)
Physician Discharge Summary  Patricia Stanley:[id number] DOB: 04-29-1946 DOA: 09/11/2019  PCP: Josetta Huddle, MD  Admit date: 09/11/2019 Discharge date: 09/16/2019  Admitted From: Home Disposition: Home  Recommendations for Outpatient Follow-up:  1. Follow up with PCP in 1-2 weeks 2. Follow-up with gastroenterology as needed 3. Started on Creon and cholestyramine with Imodium as needed for control of her chronic diarrhea 4. Prozac changed to mirtazapine to help with appetite stimulation 5. Started on iron supplementation for anemia 6. Discontinued hydralazine secondary to borderline hypotension and associated side effect 7. Please obtain BMP/CBC in one week 8. Ambulatory referrals placed for PT/OT and palliative care 9. 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  Hospital course:  Chronic diarrhea Review of EMR with indication that diarrhea has been chronic with worsening over the past several days prior to ED presentation.  Abdominal x-ray with no acute findings, no excessive colonic stool burden and unchanged left nephrolithiasis.  GI PCR panel negative.  C. difficile PCR negative.  [facility] was consulted and followed during hospital course.  Patient was started on Creon  36,000 units 3 times daily with meals, cholestyramine 4 g p.o. twice daily and Imodium as needed.  Patient's diarrhea has improved.  Patient currently too frail for endoscopic evaluation per gastroenterology.  We will continue Zofran 3 times daily prior to meals for nausea if needed.  Continue outpatient follow-up with gastroenterology as needed.  Anemia, normocytic Iron 15, TIBC 169, ferritin 188, folate 12.9, B12 515.  Patient received IV Feraheme on 09/12/2019.  We will continue ferrous sulfate 325 mg p.o. twice daily on discharge.  Recommend repeat CBC in 1 week following discharge.  Arthralgias CRP 0.6, unlikely inflammatory given normal CRP.  Hx CVA with residual left-sided weakness Evaluated by PT/OT with recommendations of outpatient therapy on discharge. Continue aspirin and statin  HLD: Continue atorvastatin 80 mg p.o. daily  Essential hypertension:  Continue lisinopril 10 mg p.o. daily, and atenolol 50 mg p.o. daily.  Discontinue hydralazine for possible side effects and borderline hypotension.  Blood pressure 117/50 at time of discharge.  Continue aspirin and statin.  Hypokalemia Hypomagnesemia Repleted during hospitalization, likely secondary to poor oral intake.  Potassium 3.9 at time of discharge.  Recommend repeat BMP in 1 week with magnesium level.  Moderate protein calorie malnutrition Adult failure to thrive, weakness, deconditioning Body mass index is 19.99 kg/m. Nutrition Status: Nutrition Problem: Moderate Malnutrition Etiology: chronic illness(CVA) Signs/Symptoms: mild fat depletion, moderate fat depletion, mild muscle depletion, moderate muscle depletion, energy intake < or equal to 75% for > or equal to 1 month, percent weight loss Percent weight loss: 11 % Interventions: Refer to RD note for recommendations  Nutrition following; diet liberalized to regular, antidepressant changed to mirtazapine to assist with appetite stimulation.  Seen by PT/OT with  recommendations of outpatient therapy.  Palliative care was also consulted and followed during the hospital course, now  DNR and recommend outpatient follow-up with palliative care following discharge.   Depression Patient was seen by psychiatry on 09/14/2019; Prozac was changed to mirtazapine to assist with appetite stimulation.  Did recommend outpatient provider to consider tapering of clonazepam to decrease symptoms of weakness and fatigue.  GERD: Continue Protonix 40 mg p.o. daily  Discharge Diagnoses:  Principal Problem:   Diarrhea Active Problems:   Major depression in partial remission (HCC)   HTN (hypertension)   Anemia   Physical deconditioning   Malnutrition of moderate degree   Palliative care by specialist   Goals of care, counseling/discussion   DNR (do not resuscitate)    Discharge Instructions  Discharge Instructions    Amb Referral to Palliative Care   Complete by: As directed    Ambulatory referral to Occupational Therapy   Complete by: As directed    Ambulatory referral to Physical Therapy   Complete by: As directed    Call MD for:  difficulty breathing, headache or visual disturbances   Complete by: As directed    Call MD for:  extreme fatigue   Complete by: As directed    Call MD for:  persistant dizziness or light-headedness   Complete by: As directed    Call MD for:  persistant nausea and vomiting   Complete by: As directed    Call MD for:  severe uncontrolled pain   Complete by: As directed    Call MD for:  temperature >100.4   Complete by: As directed    Diet - low sodium heart healthy   Complete by: As directed    Increase activity slowly   Complete by: As directed      Allergies as of 09/16/2019      Reactions   Antihistamines, Chlorpheniramine-type Other (See Comments)   Makes her heart race   Paroxetine Other (See Comments)   Shakes-tremors      Medication List    STOP taking these medications   FLUoxetine 40 MG capsule Commonly  known as: PROZAC   hydrALAZINE 25 MG tablet Commonly known as: APRESOLINE   potassium chloride 10 MEQ tablet Commonly known as: KLOR-CON     TAKE these medications   aspirin 325 MG tablet Take 1 tablet (325 mg total) by mouth daily.   atenolol 50 MG tablet Commonly known as: TENORMIN Take 1 tablet (50 mg total) by mouth every evening.   atorvastatin 80 MG tablet Commonly known as: LIPITOR Take 1 tablet (80 mg total) by mouth daily at 6 PM.   cholestyramine 4 g packet Commonly known as: QUESTRAN Take 1 packet (4 g total) by mouth 2 (two) times daily.   clonazePAM 1 MG tablet Commonly known as: KLONOPIN 1 tab every a.m. and 2 tabs nightly What changed:   how much to take  how to take this  when to take this  additional instructions   ferrous sulfate 325 (65 FE) MG tablet Take 1 tablet (325 mg total) by mouth 2 (two) times daily with a meal.   lipase/protease/amylase [id number] UNITS Cpep capsule Commonly known as: CREON Take 1 capsule (36,000 Units total) by mouth 3 (three) times daily with meals.   lisinopril 10 MG tablet Commonly known as: ZESTRIL Take 1 tablet (10 mg total) by mouth daily.   loperamide 2 MG capsule Commonly known as: IMODIUM Take 1 capsule (2 mg total) by mouth as needed for diarrhea or loose stools.   mirtazapine 15 MG tablet Commonly known as: REMERON Take 1 tablet (15  mg total) by mouth at bedtime.   ondansetron 4 MG tablet Commonly known as: ZOFRAN Take 1 tablet (4 mg total) by mouth 3 (three) times daily before meals.   pantoprazole 40 MG tablet Commonly known as: PROTONIX Take 1 tablet (40 mg total) by mouth 2 (two) times daily before a meal.      Follow-up Information    Marden Noble, MD. Schedule an appointment as soon as possible for a visit in 1 week(s).   Specialty: Internal Medicine Contact information: [address] [place] [address] [phone]          Allergies  Allergen Reactions  .  Antihistamines, Chlorpheniramine-Type Other (See Comments)    Makes her heart race  . Paroxetine Other (See Comments)    Shakes-tremors    Consultations:  Psychiatry  Palliative care   Procedures/Studies: DG Chest Port 1 View  Result Date: 09/11/2019 CLINICAL DATA:  Weakness EXAM: PORTABLE CHEST 1 VIEW COMPARISON:  04/19/2019 FINDINGS: The lung volumes are somewhat low. There are hazy ill-defined opacities at the lung bases bilaterally. There is no pneumothorax. No large pleural effusion. The heart size is normal. Aortic calcifications are noted. There is no acute osseous abnormality. IMPRESSION: Low lung volumes with bibasilar atelectasis. Electronically Signed   By: Katherine Mantle M.D.   On: 09/11/2019 21:34   DG Abd 2 Views  Result Date: 09/13/2019 CLINICAL DATA:  Diarrhea. EXAM: ABDOMEN - 2 VIEW COMPARISON:  CT abdomen pelvis dated April 21, 2019. FINDINGS: The bowel gas pattern is normal. No excessive colonic stool burden. There is no evidence of free air. Unchanged small calculus in the upper pole of the left kidney. Chronic interstitial changes at the lung bases again noted. Bilateral gluteal dystrophic calcifications again noted. No acute osseous abnormality. IMPRESSION: 1. No acute findings.  No excessive colonic stool burden. 2. Unchanged left nephrolithiasis. Electronically Signed   By: Obie Dredge M.D.   On: 09/13/2019 10:35      Subjective: Patient seen and examined bedside, resting comfortably.  Diarrhea continues to be improved.  Tolerating diet more.  Continues with some nausea, instructed her to make sure she takes some Zofran prior to meals.  Updated patient's spouse via telephone regarding medication changes and discharge home today.  No other complaints or concerns at this time.  Denies headache, no fever/chills/night sweats, no vomiting, no chest pain, palpitations, no shortness of breath, no abdominal pain, no cough/congestion.  No acute events overnight per  nursing staff.  Discharge Exam: Vitals:   09/16/19 0233 09/16/19 0731  BP: (!) 117/58 (!) 124/52  Pulse: 77 85  Resp: 16 16  Temp: 98.1 F (36.7 C) 97.9 F (36.6 C)  SpO2: 90% 93%   Vitals:   09/15/19 1935 09/16/19 0233 09/16/19 0405 09/16/19 0731  BP: (!) 101/53 (!) 117/58  (!) 124/52  Pulse: 70 77  85  Resp: Temp: 98 F (36.7 C) 98.1 F (36.7 C)  97.9 F (36.6 C)  TempSrc: Oral Oral    SpO2: 92% 90%  93%  Weight:   48.8 kg   Height:        General: Pt is alert, awake, not in acute distress, thin/cachectic in appearance Cardiovascular: RRR, S1/S2 +, no rubs, no gallops Respiratory: CTA bilaterally, no wheezing, no rhonchi, oxygenating well on room air Abdominal: Soft, NT, ND, bowel sounds + Extremities: no edema, no cyanosis    The results of significant diagnostics from this hospitalization (including imaging,  microbiology, ancillary and laboratory) are listed below for reference.     Microbiology: Recent Results (from the past 240 hour(s))  Respiratory Panel by RT PCR (Flu A&B, Covid) - Nasopharyngeal Swab     Status: None   Collection Time: 09/11/19 11:52 PM   Specimen: Nasopharyngeal Swab  Result Value Ref Range Status   SARS Coronavirus 2 by RT PCR NEGATIVE NEGATIVE Final    Comment: (NOTE) SARS-CoV-2 target nucleic acids are NOT DETECTED. The SARS-CoV-2 RNA is generally detectable in upper respiratoy specimens during the acute phase of infection. The lowest concentration of SARS-CoV-2 viral copies this assay can detect is 131 copies/mL. A negative result does not preclude SARS-Cov-2 infection and should not be used as the sole basis for treatment or other patient management decisions. A negative result may occur with  improper specimen collection/handling, submission of specimen other than nasopharyngeal swab, presence of viral mutation(s) within the areas targeted by this assay, and inadequate number of viral copies (<131 copies/mL). A  negative result must be combined with clinical observations, patient history, and epidemiological information. The expected result is Negative. Fact Sheet for Patients:  https://www.moore.com/ Fact Sheet for Healthcare Providers:  https://www.young.biz/ This test is not yet ap proved or cleared by the [place] FDA and  has been authorized for detection and/or diagnosis of SARS-CoV-2 by FDA under an Emergency Use Authorization (EUA). This EUA will remain  in effect (meaning this test can be used) for the duration of the COVID-19 declaration under Section 564(b)(1) of the Act, 21 U.S.C. section 360bbb-3(b)(1), unless the authorization is terminated or revoked sooner.    Influenza A by PCR NEGATIVE NEGATIVE Final   Influenza B by PCR NEGATIVE NEGATIVE Final    Comment: (NOTE) The Xpert Xpress SARS-CoV-2/FLU/RSV assay is intended as an aid in  the diagnosis of influenza from Nasopharyngeal swab specimens and  should not be used as a sole basis for treatment. Nasal washings and  aspirates are unacceptable for Xpert Xpress SARS-CoV-2/FLU/RSV  testing. Fact Sheet for Patients: https://www.moore.com/ Fact Sheet for Healthcare Providers: https://www.young.biz/ This test is not yet approved or cleared by the [place] FDA and  has been authorized for detection and/or diagnosis of SARS-CoV-2 by  FDA under an Emergency Use Authorization (EUA). This EUA will remain  in effect (meaning this test can be used) for the duration of the  Covid-19 declaration under Section 564(b)(1) of the Act, 21  U.S.C. section 360bbb-3(b)(1), unless the authorization is  terminated or revoked. Performed at [facility], [address], [place], [place] [address]   Gastrointestinal Panel by PCR , Stool     Status: None   Collection Time: 09/12/19  2:33 AM   Specimen: Stool  Result Value Ref Range Status   Campylobacter  species NOT DETECTED NOT DETECTED Final   Plesimonas shigelloides NOT DETECTED NOT DETECTED Final   Salmonella species NOT DETECTED NOT DETECTED Final   Yersinia enterocolitica NOT DETECTED NOT DETECTED Final   Vibrio species NOT DETECTED NOT DETECTED Final   Vibrio cholerae NOT DETECTED NOT DETECTED Final   Enteroaggregative E coli (EAEC) NOT DETECTED NOT DETECTED Final   Enteropathogenic E coli (EPEC) NOT DETECTED NOT DETECTED Final   Enterotoxigenic E coli (ETEC) NOT DETECTED NOT DETECTED Final   Shiga like toxin producing E coli (STEC) NOT DETECTED NOT DETECTED Final   Shigella/Enteroinvasive E coli (EIEC) NOT DETECTED NOT DETECTED Final   Cryptosporidium NOT DETECTED NOT DETECTED Final   Cyclospora cayetanensis NOT DETECTED NOT  DETECTED Final   Entamoeba histolytica NOT DETECTED NOT DETECTED Final   Giardia lamblia NOT DETECTED NOT DETECTED Final   Adenovirus F40/41 NOT DETECTED NOT DETECTED Final   Astrovirus NOT DETECTED NOT DETECTED Final   Norovirus GI/GII NOT DETECTED NOT DETECTED Final   Rotavirus A NOT DETECTED NOT DETECTED Final   Sapovirus (I, II, IV, and V) NOT DETECTED NOT DETECTED Final    Comment: Performed at [facility], [address] [place], [place] [address]  C Difficile Quick Screen w PCR reflex     Status: None   Collection Time: 09/13/19 11:50 AM   Specimen: STOOL  Result Value Ref Range Status   C Diff antigen NEGATIVE NEGATIVE Final   C Diff toxin NEGATIVE NEGATIVE Final   C Diff interpretation No C. difficile detected.  Final    Comment: Performed at [facility], [address], [place], [place] [address]     Labs: BNP (last 3 results) No results for input(s): BNP in the last 8760 hours. Basic Metabolic Panel: Recent Labs  Lab 09/11/19 2103 09/12/19 0347 09/14/19 0251 09/15/19 0734 09/16/19 0248  NA 140 142 135 137 136  K 4.1 3.8 3.4* 3.7 3.9  CL 107 109 105 104 106  CO2 23 23 23 24 24   GLUCOSE 100* 88 113* 101* 99  BUN  15 13 14 9 13   CREATININE 0.96 0.84 0.84 0.72 0.72  CALCIUM 8.3* 8.4* 7.8* 8.1* 7.8*  MG  --  1.6*  --  1.3* 2.2  PHOS  --  2.9  --   --   --    Liver Function Tests: Recent Labs  Lab 09/11/19 2103  AST 23  ALT 15  ALKPHOS 65  BILITOT 0.7  PROT 5.3*  ALBUMIN 1.8*   No results for input(s): LIPASE, AMYLASE in the last 168 hours. No results for input(s): AMMONIA in the last 168 hours. CBC: Recent Labs  Lab 09/11/19 2103 09/12/19 0347 09/14/19 0251 09/15/19 0734 09/16/19 0248  WBC 8.3 9.4 10.1 11.7* 12.8*  NEUTROABS 6.4  --   --   --   --   HGB 7.8* 8.2* 7.3* 7.6* 7.2*  HCT 25.0* 26.1* 23.6* 23.8* 22.8*  MCV 90.3 89.4 89.4 86.9 88.7  PLT 248 260 240 289 276   Cardiac Enzymes: No results for input(s): CKTOTAL, CKMB, CKMBINDEX, TROPONINI in the last 168 hours. BNP: Invalid input(s): POCBNP CBG: No results for input(s): GLUCAP in the last 168 hours. D-Dimer No results for input(s): DDIMER in the last 72 hours. Hgb A1c No results for input(s): HGBA1C in the last 72 hours. Lipid Profile No results for input(s): CHOL, HDL, LDLCALC, TRIG, CHOLHDL, LDLDIRECT in the last 72 hours. Thyroid function studies No results for input(s): TSH, T4TOTAL, T3FREE, THYROIDAB in the last 72 hours.  Invalid input(s): FREET3 Anemia work up No results for input(s): VITAMINB12, FOLATE, FERRITIN, TIBC, IRON, RETICCTPCT in the last 72 hours. Urinalysis    Component Value Date/Time   COLORURINE YELLOW 08/29/2019 0258   APPEARANCEUR CLEAR 08/29/2019 0258   LABSPEC 1.015 08/29/2019 0258   PHURINE 6.0 08/29/2019 0258   GLUCOSEU NEGATIVE 08/29/2019 0258   HGBUR NEGATIVE 08/29/2019 0258   BILIRUBINUR NEGATIVE 08/29/2019 0258   KETONESUR 20 (A) 08/29/2019 0258   PROTEINUR 30 (A) 08/29/2019 0258   NITRITE NEGATIVE 08/29/2019 0258   LEUKOCYTESUR NEGATIVE 08/29/2019 0258   Sepsis Labs Invalid input(s): PROCALCITONIN,  WBC,  LACTICIDVEN Microbiology Recent Results (from the past 240  hour(s))  Respiratory Panel by RT PCR (Flu A&B, Covid) - Nasopharyngeal Swab     Status: None   Collection Time: 09/11/19 11:52 PM   Specimen: Nasopharyngeal Swab  Result Value Ref Range Status   SARS Coronavirus 2 by RT PCR NEGATIVE NEGATIVE Final    Comment: (NOTE) SARS-CoV-2 target nucleic acids are NOT DETECTED. The SARS-CoV-2 RNA is generally detectable in upper respiratoy specimens during the acute phase of infection. The lowest concentration of SARS-CoV-2 viral copies this assay can detect is 131 copies/mL. A negative result does not preclude SARS-Cov-2 infection and should not be used as the sole basis for treatment or other patient management decisions. A negative result may occur with  improper specimen collection/handling, submission of specimen other than nasopharyngeal swab, presence of viral mutation(s) within the areas targeted by this assay, and inadequate number of viral copies (<131 copies/mL). A negative result must be combined with clinical observations, patient history, and epidemiological information. The expected result is Negative. Fact Sheet for Patients:  https://www.moore.com/ Fact Sheet for Healthcare Providers:  https://www.young.biz/ This test is not yet ap proved or cleared by the [place] FDA and  has been authorized for detection and/or diagnosis of SARS-CoV-2 by FDA under an Emergency Use Authorization (EUA). This EUA will remain  in effect (meaning this test can be used) for the duration of the COVID-19 declaration under Section 564(b)(1) of the Act, 21 U.S.C. section 360bbb-3(b)(1), unless the authorization is terminated or revoked sooner.    Influenza A by PCR NEGATIVE NEGATIVE Final   Influenza B by PCR NEGATIVE NEGATIVE Final    Comment: (NOTE) The Xpert Xpress SARS-CoV-2/FLU/RSV assay is intended as an aid in  the diagnosis of influenza from Nasopharyngeal swab specimens and  should not be used as  a sole basis for treatment. Nasal washings and  aspirates are unacceptable for Xpert Xpress SARS-CoV-2/FLU/RSV  testing. Fact Sheet for Patients: https://www.moore.com/ Fact Sheet for Healthcare Providers: https://www.young.biz/ This test is not yet approved or cleared by the [place] FDA and  has been authorized for detection and/or diagnosis of SARS-CoV-2 by  FDA under an Emergency Use Authorization (EUA). This EUA will remain  in effect (meaning this test can be used) for the duration of the  Covid-19 declaration under Section 564(b)(1) of the Act, 21  U.S.C. section 360bbb-3(b)(1), unless the authorization is  terminated or revoked. Performed at [facility], [address], [place], [place] [address]   Gastrointestinal Panel by PCR , Stool     Status: None   Collection Time: 09/12/19  2:33 AM   Specimen: Stool  Result Value Ref Range Status   Campylobacter species NOT DETECTED NOT DETECTED Final   Plesimonas shigelloides NOT DETECTED NOT DETECTED Final   Salmonella species NOT DETECTED NOT DETECTED Final   Yersinia enterocolitica NOT DETECTED NOT DETECTED Final   Vibrio species NOT DETECTED NOT DETECTED Final   Vibrio cholerae NOT DETECTED NOT DETECTED Final   Enteroaggregative E coli (EAEC) NOT DETECTED NOT DETECTED Final   Enteropathogenic E coli (EPEC) NOT DETECTED NOT DETECTED Final   Enterotoxigenic E coli (ETEC) NOT DETECTED NOT DETECTED Final   Shiga like toxin producing E coli (STEC) NOT DETECTED NOT DETECTED Final   Shigella/Enteroinvasive E coli (EIEC) NOT DETECTED NOT DETECTED Final   Cryptosporidium NOT DETECTED NOT DETECTED Final   Cyclospora cayetanensis NOT DETECTED NOT DETECTED Final   Entamoeba histolytica NOT DETECTED NOT DETECTED Final   Giardia lamblia NOT DETECTED NOT DETECTED Final   Adenovirus  F40/41 NOT DETECTED NOT DETECTED Final   Astrovirus NOT DETECTED NOT DETECTED Final   Norovirus GI/GII NOT  DETECTED NOT DETECTED Final   Rotavirus A NOT DETECTED NOT DETECTED Final   Sapovirus (I, II, IV, and V) NOT DETECTED NOT DETECTED Final    Comment: Performed at [facility], [address], [place], [place] [address]  C Difficile Quick Screen w PCR reflex     Status: None   Collection Time: 09/13/19 11:50 AM   Specimen: STOOL  Result Value Ref Range Status   C Diff antigen NEGATIVE NEGATIVE Final   C Diff toxin NEGATIVE NEGATIVE Final   C Diff interpretation No C. difficile detected.  Final    Comment: Performed at [facility], [address] [place], [place] [address]     Time coordinating discharge: Over 30 minutes  SIGNED:   Alvira Philips [place], DO  [facility] 09/16/2019, 10:37 AM

## 2019-09-16 NOTE — Care Management Important Message (Signed)
Important Message  Patient Details  Name: Patricia Stanley MRN: [id number] Date of Birth: July 07, 1945   Medicare Important Message Given:  Yes     Dorena Bodo 09/16/2019, 1:34 PM

## 2019-09-16 NOTE — Progress Notes (Signed)
   09/16/19 1612  Clinical Encounter Type  Visited With Patient and family together  Visit Type Initial;Spiritual support  Referral From Palliative care team  Consult/Referral To Chaplain  Spiritual Encounters  Spiritual Needs Prayer  Stress Factors  Patient Stress Factors Other (Comment) (Pain)  This chaplain responded to PMT consult for spiritual care.  The Pt. husband-Larry was present bedside with the Pt.  Patricia Stanley confirmed the Pt. is going home with HomeHealth today. The Pt. shared her desire to sleep to relieve herself of the pain. The Pt. husband shared the Pt. was given Tylenol about 20 minutes ago. The Pt. accepted a warm washcloth on her forehead from the chaplain.  The chaplain prayed with the Pt. and her husband before leaving the room. F/U spiritual care is available as needed.

## 2019-09-16 NOTE — Care Management Important Message (Signed)
Important Message  Patient Details  Name: Patricia Stanley MRN: [id number] Date of Birth: 04-19-1946   Medicare Important Message Given:  Yes     Dorena Bodo 09/16/2019, 1:31 PM

## 2019-09-16 NOTE — Progress Notes (Signed)
Occupational Therapy Progress Note  Spouse (and pt) instructed how to negotiate stairs using w/c - handout provided.  Spouse reports he feels confident in his ability to do this and to take care of pt at discharge.     09/16/19 1601  OT Visit Information  Last OT Received On 09/16/19  Assistance Needed +1  History of Present Illness Pt is a 74 y.o. female with PMH of CVA (04/2019) with residual left sided deficits, CAS s/p R CEA (04/2019), MDD, HTN, HLD, who presents 09/11/19 with weakness and diarrhea. Workup for anemia and failure to thrive.  Precautions  Precautions Fall  Pain Assessment  Pain Assessment Faces  Faces Pain Scale 6  Pain Location generalized   Pain Descriptors / Indicators Aching;Grimacing;Restless  Pain Intervention(s) Monitored during session  Cognition  Arousal/Alertness Awake/alert;Lethargic  Behavior During Therapy Flat affect  Overall Cognitive Status History of cognitive impairments - at baseline  Upper Extremity Assessment  Upper Extremity Assessment Generalized weakness  LUE Deficits / Details residual left side deficits from CVA 04/2019;pt able to reach full shoulder flexion with increased time and effort;strength 3/5;  LUE Sensation WNL  LUE Coordination decreased fine motor;decreased gross motor  Lower Extremity Assessment  Lower Extremity Assessment Defer to PT evaluation  Exercises  Exercises Other exercises  Other Exercises  Other Exercises pt and spouse were instructed how to negotiate stairs in a w/c.  Written info provided.  Pt reports he feels he can manage this safely  OT - End of Session  Patient left in chair;with call bell/phone within reach;with chair alarm set  OT Assessment/Plan  OT Plan Discharge plan remains appropriate  OT Visit Diagnosis Unsteadiness on feet (R26.81);Cognitive communication deficit (R41.841)  OT Frequency (ACUTE ONLY) Min 2X/week  Follow Up Recommendations Home health OT;Supervision/Assistance - 24 hour  OT  Equipment Wheelchair (measurements OT);Hospital bed (14" w/c )  AM-PAC OT "6 Clicks" Daily Activity Outcome Measure (Version 2)  Help from another person eating meals? 3  Help from another person taking care of personal grooming? 3  Help from another person toileting, which includes using toliet, bedpan, or urinal? 3  Help from another person bathing (including washing, rinsing, drying)? 3  Help from another person to put on and taking off regular upper body clothing? 3  Help from another person to put on and taking off regular lower body clothing? 3  6 Click Score 18  OT Goal Progression  Progress towards OT goals Progressing toward goals  OT Time Calculation  OT Start Time (ACUTE ONLY) 1335  OT Stop Time (ACUTE ONLY) 1344  OT Time Calculation (min) 9 min  OT General Charges  $OT Visit 1 Visit  OT Treatments  $Self Care/Home Management  8-22 mins  Eber Jones., OTR/L Acute Rehabilitation Services Pager [phone] Office [phone]

## 2019-09-16 NOTE — Progress Notes (Signed)
Provided discharge education/instructions, all questions and concerns addressed, Pt not in distress, wheelchair delivered to room. Discharged home accompanied by husband.

## 2019-09-16 NOTE — TOC Progression Note (Signed)
Transition of Care [facility]) - Progression Note    Patient Details  Name: Patricia Stanley MRN: [id number] Date of Birth: 12-04-1945  [facility]) CM/SW [place], RN Phone Number: 09/16/2019, 1:37 PM  Clinical Narrative:    Case management met with the patient and family and the family stated that the patient is too weak and deconditioned to have outpatient PT/OT easily by car.  Called [place]. [place] and home health ordered for PT and OT.  Patient's family given medicare choice regarding home health and patient did not have a preference.  Called [facility] and set patient up with home health PT/OT.  Patient's family given choice regarding dme services - no preference from the family as well.  Called [employer] and ordered a hospital bed and small 16 in wheelchair since they did not have a 14 in.  [employer] to deliver the wheelchair to the room and call the husband to set up hospital bed delivery.  Patient will go home by car with the husband.  Expected Discharge Plan: [place] Barriers to Discharge: Continued Medical Work up  Expected Discharge Plan and Services Expected Discharge Plan: [place] In-house Referral: Hospice / Palliative Care Discharge Planning Services: CM Consult   Living arrangements for the past 2 months: Single Family Home Expected Discharge Date: 09/16/19                    Social Determinants of Health (SDOH) Interventions    Readmission Risk Interventions Readmission Risk Prevention Plan 09/15/2019  Transportation Screening Complete  PCP or Specialist Appt within 5-7 Days Complete  Home Care Screening Complete  Medication Review (RN CM) Complete  Some recent data might be hidden

## 2019-09-16 NOTE — Care Management (Signed)
    Durable Medical Equipment  (From admission, onward)         Start     Ordered   09/16/19 1432  For home use only DME Hospital bed  Once    Question Answer Comment  Length of Need Lifetime   The above medical condition requires: Patient requires the ability to reposition frequently   Head must be elevated greater than: 30 degrees   Bed type Semi-electric   Support Surface: Gel Overlay      09/16/19 1432   09/16/19 1314  For home use only DME lightweight manual wheelchair with seat cushion  Once    Comments: Patient suffers from generalized weakness, deconditioning, anemia, palliative care support which impairs their ability to perform daily activities like [id number]:[id number] in the home.  A walking GFQ:[id number] will not resolve  issue with performing activities of daily living. A wheelchair will allow patient to safely perform daily activities. Patient is not able to propel themselves in the home using a standard weight wheelchair due to weakness:[id number]. Patient can self propel in the lightweight wheelchair. Length of need lifetime. - Please deliver 14 inch wheelchair Accessories: elevating leg rests (ELRs), wheel locks, extensions and anti-tippers.   09/16/19 1315

## 2019-09-16 NOTE — Plan of Care (Signed)

## 2019-09-19 DIAGNOSIS — R627 Adult failure to thrive: Secondary | ICD-10-CM | POA: Diagnosis not present

## 2019-09-19 DIAGNOSIS — R5381 Other malaise: Secondary | ICD-10-CM | POA: Diagnosis not present

## 2019-09-19 DIAGNOSIS — E440 Moderate protein-calorie malnutrition: Secondary | ICD-10-CM | POA: Diagnosis not present

## 2019-09-19 DIAGNOSIS — D649 Anemia, unspecified: Secondary | ICD-10-CM | POA: Diagnosis not present

## 2019-09-20 ENCOUNTER — Inpatient Hospital Stay (HOSPITAL_COMMUNITY)
Admission: EM | Admit: 2019-09-20 | Discharge: 2019-09-27 | DRG: 064 | Disposition: A | Discharge status: Rehabilitation Facility | Payer: Medicare HMO | Attending: Internal Medicine | Admitting: Internal Medicine

## 2019-09-20 ENCOUNTER — Emergency Department (HOSPITAL_COMMUNITY): Payer: Medicare HMO

## 2019-09-20 ENCOUNTER — Other Ambulatory Visit: Payer: Self-pay

## 2019-09-20 ENCOUNTER — Encounter (HOSPITAL_COMMUNITY): Payer: Self-pay | Admitting: Emergency Medicine

## 2019-09-20 DIAGNOSIS — R5381 Other malaise: Secondary | ICD-10-CM | POA: Diagnosis present

## 2019-09-20 DIAGNOSIS — F3341 Major depressive disorder, recurrent, in partial remission: Secondary | ICD-10-CM

## 2019-09-20 DIAGNOSIS — I69322 Dysarthria following cerebral infarction: Secondary | ICD-10-CM | POA: Diagnosis not present

## 2019-09-20 DIAGNOSIS — K5900 Constipation, unspecified: Secondary | ICD-10-CM | POA: Diagnosis not present

## 2019-09-20 DIAGNOSIS — Z87442 Personal history of urinary calculi: Secondary | ICD-10-CM

## 2019-09-20 DIAGNOSIS — R0902 Hypoxemia: Secondary | ICD-10-CM | POA: Diagnosis not present

## 2019-09-20 DIAGNOSIS — R29702 NIHSS score 2: Secondary | ICD-10-CM | POA: Diagnosis present

## 2019-09-20 DIAGNOSIS — I6340 Cerebral infarction due to embolism of unspecified cerebral artery: Secondary | ICD-10-CM | POA: Diagnosis present

## 2019-09-20 DIAGNOSIS — R233 Spontaneous ecchymoses: Secondary | ICD-10-CM | POA: Diagnosis present

## 2019-09-20 DIAGNOSIS — I2729 Other secondary pulmonary hypertension: Secondary | ICD-10-CM | POA: Diagnosis not present

## 2019-09-20 DIAGNOSIS — N179 Acute kidney failure, unspecified: Secondary | ICD-10-CM | POA: Diagnosis present

## 2019-09-20 DIAGNOSIS — E860 Dehydration: Secondary | ICD-10-CM | POA: Diagnosis present

## 2019-09-20 DIAGNOSIS — G9349 Other encephalopathy: Secondary | ICD-10-CM | POA: Diagnosis not present

## 2019-09-20 DIAGNOSIS — Z66 Do not resuscitate: Secondary | ICD-10-CM | POA: Diagnosis present

## 2019-09-20 DIAGNOSIS — J849 Interstitial pulmonary disease, unspecified: Secondary | ICD-10-CM | POA: Diagnosis not present

## 2019-09-20 DIAGNOSIS — J9600 Acute respiratory failure, unspecified whether with hypoxia or hypercapnia: Secondary | ICD-10-CM | POA: Diagnosis not present

## 2019-09-20 DIAGNOSIS — I6302 Cerebral infarction due to thrombosis of basilar artery: Secondary | ICD-10-CM | POA: Diagnosis not present

## 2019-09-20 DIAGNOSIS — R7989 Other specified abnormal findings of blood chemistry: Secondary | ICD-10-CM | POA: Diagnosis present

## 2019-09-20 DIAGNOSIS — E440 Moderate protein-calorie malnutrition: Secondary | ICD-10-CM | POA: Diagnosis present

## 2019-09-20 DIAGNOSIS — I69354 Hemiplegia and hemiparesis following cerebral infarction affecting left non-dominant side: Secondary | ICD-10-CM | POA: Diagnosis not present

## 2019-09-20 DIAGNOSIS — I5032 Chronic diastolic (congestive) heart failure: Secondary | ICD-10-CM | POA: Diagnosis present

## 2019-09-20 DIAGNOSIS — D649 Anemia, unspecified: Secondary | ICD-10-CM | POA: Diagnosis not present

## 2019-09-20 DIAGNOSIS — E46 Unspecified protein-calorie malnutrition: Secondary | ICD-10-CM | POA: Diagnosis not present

## 2019-09-20 DIAGNOSIS — R531 Weakness: Secondary | ICD-10-CM | POA: Diagnosis not present

## 2019-09-20 DIAGNOSIS — I340 Nonrheumatic mitral (valve) insufficiency: Secondary | ICD-10-CM | POA: Diagnosis not present

## 2019-09-20 DIAGNOSIS — R0989 Other specified symptoms and signs involving the circulatory and respiratory systems: Secondary | ICD-10-CM | POA: Diagnosis not present

## 2019-09-20 DIAGNOSIS — R64 Cachexia: Secondary | ICD-10-CM | POA: Diagnosis present

## 2019-09-20 DIAGNOSIS — J470 Bronchiectasis with acute lower respiratory infection: Secondary | ICD-10-CM | POA: Diagnosis present

## 2019-09-20 DIAGNOSIS — R52 Pain, unspecified: Secondary | ICD-10-CM | POA: Diagnosis present

## 2019-09-20 DIAGNOSIS — I10 Essential (primary) hypertension: Secondary | ICD-10-CM | POA: Diagnosis not present

## 2019-09-20 DIAGNOSIS — J9601 Acute respiratory failure with hypoxia: Secondary | ICD-10-CM | POA: Diagnosis present

## 2019-09-20 DIAGNOSIS — I272 Pulmonary hypertension, unspecified: Secondary | ICD-10-CM | POA: Diagnosis present

## 2019-09-20 DIAGNOSIS — Z8249 Family history of ischemic heart disease and other diseases of the circulatory system: Secondary | ICD-10-CM

## 2019-09-20 DIAGNOSIS — J479 Bronchiectasis, uncomplicated: Secondary | ICD-10-CM | POA: Diagnosis not present

## 2019-09-20 DIAGNOSIS — Z9981 Dependence on supplemental oxygen: Secondary | ICD-10-CM

## 2019-09-20 DIAGNOSIS — N390 Urinary tract infection, site not specified: Secondary | ICD-10-CM | POA: Diagnosis not present

## 2019-09-20 DIAGNOSIS — I6529 Occlusion and stenosis of unspecified carotid artery: Secondary | ICD-10-CM | POA: Diagnosis present

## 2019-09-20 DIAGNOSIS — Z888 Allergy status to other drugs, medicaments and biological substances status: Secondary | ICD-10-CM

## 2019-09-20 DIAGNOSIS — Z83438 Family history of other disorder of lipoprotein metabolism and other lipidemia: Secondary | ICD-10-CM

## 2019-09-20 DIAGNOSIS — E785 Hyperlipidemia, unspecified: Secondary | ICD-10-CM | POA: Diagnosis present

## 2019-09-20 DIAGNOSIS — R918 Other nonspecific abnormal finding of lung field: Secondary | ICD-10-CM | POA: Diagnosis not present

## 2019-09-20 DIAGNOSIS — I110 Hypertensive heart disease with heart failure: Secondary | ICD-10-CM | POA: Diagnosis present

## 2019-09-20 DIAGNOSIS — K529 Noninfective gastroenteritis and colitis, unspecified: Secondary | ICD-10-CM | POA: Diagnosis present

## 2019-09-20 DIAGNOSIS — F419 Anxiety disorder, unspecified: Secondary | ICD-10-CM | POA: Diagnosis present

## 2019-09-20 DIAGNOSIS — M4802 Spinal stenosis, cervical region: Secondary | ICD-10-CM | POA: Diagnosis not present

## 2019-09-20 DIAGNOSIS — Z79899 Other long term (current) drug therapy: Secondary | ICD-10-CM

## 2019-09-20 DIAGNOSIS — I63233 Cerebral infarction due to unspecified occlusion or stenosis of bilateral carotid arteries: Secondary | ICD-10-CM | POA: Diagnosis not present

## 2019-09-20 DIAGNOSIS — Z20822 Contact with and (suspected) exposure to covid-19: Secondary | ICD-10-CM | POA: Diagnosis present

## 2019-09-20 DIAGNOSIS — K219 Gastro-esophageal reflux disease without esophagitis: Secondary | ICD-10-CM | POA: Diagnosis present

## 2019-09-20 DIAGNOSIS — R471 Dysarthria and anarthria: Secondary | ICD-10-CM | POA: Diagnosis present

## 2019-09-20 DIAGNOSIS — R7303 Prediabetes: Secondary | ICD-10-CM | POA: Diagnosis not present

## 2019-09-20 DIAGNOSIS — R54 Age-related physical debility: Secondary | ICD-10-CM | POA: Diagnosis present

## 2019-09-20 DIAGNOSIS — K5903 Drug induced constipation: Secondary | ICD-10-CM | POA: Diagnosis not present

## 2019-09-20 DIAGNOSIS — R7401 Elevation of levels of liver transaminase levels: Secondary | ICD-10-CM | POA: Diagnosis not present

## 2019-09-20 DIAGNOSIS — Z87891 Personal history of nicotine dependence: Secondary | ICD-10-CM

## 2019-09-20 DIAGNOSIS — I63019 Cerebral infarction due to thrombosis of unspecified vertebral artery: Secondary | ICD-10-CM | POA: Diagnosis not present

## 2019-09-20 DIAGNOSIS — Z8673 Personal history of transient ischemic attack (TIA), and cerebral infarction without residual deficits: Secondary | ICD-10-CM | POA: Diagnosis not present

## 2019-09-20 DIAGNOSIS — I639 Cerebral infarction, unspecified: Secondary | ICD-10-CM | POA: Diagnosis present

## 2019-09-20 DIAGNOSIS — J8410 Pulmonary fibrosis, unspecified: Secondary | ICD-10-CM | POA: Diagnosis not present

## 2019-09-20 DIAGNOSIS — Z7982 Long term (current) use of aspirin: Secondary | ICD-10-CM

## 2019-09-20 DIAGNOSIS — G43909 Migraine, unspecified, not intractable, without status migrainosus: Secondary | ICD-10-CM | POA: Diagnosis present

## 2019-09-20 DIAGNOSIS — M6281 Muscle weakness (generalized): Secondary | ICD-10-CM | POA: Diagnosis not present

## 2019-09-20 DIAGNOSIS — N200 Calculus of kidney: Secondary | ICD-10-CM | POA: Diagnosis not present

## 2019-09-20 DIAGNOSIS — F324 Major depressive disorder, single episode, in partial remission: Secondary | ICD-10-CM | POA: Diagnosis present

## 2019-09-20 DIAGNOSIS — D72829 Elevated white blood cell count, unspecified: Secondary | ICD-10-CM | POA: Diagnosis not present

## 2019-09-20 DIAGNOSIS — R200 Anesthesia of skin: Secondary | ICD-10-CM | POA: Diagnosis not present

## 2019-09-20 DIAGNOSIS — J8489 Other specified interstitial pulmonary diseases: Secondary | ICD-10-CM | POA: Diagnosis not present

## 2019-09-20 DIAGNOSIS — D638 Anemia in other chronic diseases classified elsewhere: Secondary | ICD-10-CM | POA: Diagnosis not present

## 2019-09-20 DIAGNOSIS — M50223 Other cervical disc displacement at C6-C7 level: Secondary | ICD-10-CM | POA: Diagnosis not present

## 2019-09-20 DIAGNOSIS — R0602 Shortness of breath: Secondary | ICD-10-CM | POA: Diagnosis not present

## 2019-09-20 DIAGNOSIS — R634 Abnormal weight loss: Secondary | ICD-10-CM | POA: Diagnosis not present

## 2019-09-20 DIAGNOSIS — R627 Adult failure to thrive: Secondary | ICD-10-CM | POA: Diagnosis not present

## 2019-09-20 DIAGNOSIS — E8809 Other disorders of plasma-protein metabolism, not elsewhere classified: Secondary | ICD-10-CM | POA: Diagnosis not present

## 2019-09-20 DIAGNOSIS — Z681 Body mass index (BMI) 19 or less, adult: Secondary | ICD-10-CM | POA: Diagnosis not present

## 2019-09-20 LAB — CBC WITH DIFFERENTIAL/PLATELET
Abs Immature Granulocytes: 0.08 10*3/uL — ABNORMAL HIGH (ref 0.00–0.07)
Basophils Absolute: 0.1 10*3/uL (ref 0.0–0.1)
Basophils Relative: 1 %
Eosinophils Absolute: 0.8 10*3/uL — ABNORMAL HIGH (ref 0.0–0.5)
Eosinophils Relative: 7 %
HCT: 23.5 % — ABNORMAL LOW (ref 36.0–46.0)
Hemoglobin: 7.1 g/dL — ABNORMAL LOW (ref 12.0–15.0)
Immature Granulocytes: 1 %
Lymphocytes Relative: 10 %
Lymphs Abs: 1.1 10*3/uL (ref 0.7–4.0)
MCH: 28.1 pg (ref 26.0–34.0)
MCHC: 30.2 g/dL (ref 30.0–36.0)
MCV: 92.9 fL (ref 80.0–100.0)
Monocytes Absolute: 0.5 10*3/uL (ref 0.1–1.0)
Monocytes Relative: 4 %
Neutro Abs: 8.2 10*3/uL — ABNORMAL HIGH (ref 1.7–7.7)
Neutrophils Relative %: 77 %
Platelets: 313 10*3/uL (ref 150–400)
RBC: 2.53 MIL/uL — ABNORMAL LOW (ref 3.87–5.11)
RDW: 18.7 % — ABNORMAL HIGH (ref 11.5–15.5)
WBC: 10.6 10*3/uL — ABNORMAL HIGH (ref 4.0–10.5)
nRBC: 0.8 % — ABNORMAL HIGH (ref 0.0–0.2)

## 2019-09-20 LAB — URINALYSIS, ROUTINE W REFLEX MICROSCOPIC
Bilirubin Urine: NEGATIVE
Glucose, UA: NEGATIVE mg/dL
Ketones, ur: NEGATIVE mg/dL
Nitrite: NEGATIVE
Protein, ur: 100 mg/dL — AB
Specific Gravity, Urine: 1.018 (ref 1.005–1.030)
pH: 5 (ref 5.0–8.0)

## 2019-09-20 LAB — PROTIME-INR
INR: 1.2 (ref 0.8–1.2)
Prothrombin Time: 14.5 seconds (ref 11.4–15.2)

## 2019-09-20 LAB — BASIC METABOLIC PANEL
Anion gap: 6 (ref 5–15)
BUN: 22 mg/dL (ref 8–23)
CO2: 22 mmol/L (ref 22–32)
Calcium: 8.8 mg/dL — ABNORMAL LOW (ref 8.9–10.3)
Chloride: 113 mmol/L — ABNORMAL HIGH (ref 98–111)
Creatinine, Ser: 1 mg/dL (ref 0.44–1.00)
GFR calc Af Amer: >60 mL/min (ref >60)
GFR calc non Af Amer: 56 mL/min — ABNORMAL LOW (ref >60)
Glucose, Bld: 95 mg/dL (ref 70–99)
Potassium: 4.7 mmol/L (ref 3.5–5.1)
Sodium: 141 mmol/L (ref 135–145)

## 2019-09-20 LAB — CBG MONITORING, ED: Glucose-Capillary: 93 mg/dL (ref 70–99)

## 2019-09-20 MED ORDER — FENTANYL CITRATE (PF) 100 MCG/2ML IJ SOLN
50.0000 ug | Freq: Once | INTRAMUSCULAR | Status: AC
  Administered 2019-09-20: 50 ug via INTRAVENOUS
  Filled 2019-09-20: qty 2

## 2019-09-20 MED ORDER — ONDANSETRON HCL 4 MG/2ML IJ SOLN
4.0000 mg | Freq: Once | INTRAMUSCULAR | Status: AC
  Administered 2019-09-20: 4 mg via INTRAVENOUS
  Filled 2019-09-20: qty 2

## 2019-09-20 NOTE — ED Triage Notes (Signed)
Pt BIB [facility] from home, pt had a stroke in December 2020 with residual left sided weakness. Husband reports when pt woke up at 0900 today, she had new left hand paralysis and numbness. Pt and family report increasing weakness and difficulty with ADLs.

## 2019-09-20 NOTE — ED Notes (Signed)
Pt transported to MRI 

## 2019-09-20 NOTE — ED Provider Notes (Signed)
  Physical Exam  BP (!) 155/77   Pulse 72   Temp 97.9 F (36.6 C) (Oral)   Resp 14   SpO2 97%   Physical Exam  ED Course/Procedures     Procedures  MDM  Patient care assumed from [place] L PA at shift change, please see her note for full HPI.  Briefly, patient here with worsening left upper extremity weakness, prior history of stroke in December.  Had blood thinners discontinued in May.  Dr. Otelia Limes, neurologist has been consulted, MRI showed:  MRI brain shows acute 8 mm left basal ganglia infarct. Additional small volume subacute ischemic changes. Multiple scattered chronic microvascular ischemic disease with multiple remote lacunar infarcts. MRI C spine shows degenerative disc osteophyte at C4-C5 with moderate spinal stenosis and right C5 foraminal narrowing.  She has disc bulge with spurring at C5-C6 and C6-C7.  Plan is for admission for further stroke workup.   11:49 PM Spoke to Dr. Adela Glimpse, who has agreed to admit patient for further stroke workup.    Portions of this note were generated with Scientist, clinical (histocompatibility and immunogenetics). Dictation errors may occur despite best attempts at proofreading.         Claude Manges, PA-C 09/20/19 2350    Linwood Dibbles, MD 09/21/19 [phone]

## 2019-09-20 NOTE — H&P (Signed)
Patricia Stanley DTO:[id number] DOB: 24-Apr-1946 DOA: 09/20/2019     PCP: Josetta Huddle, MD   Outpatient Specialists:    GI  Dr. Paulita Fujita [facility], ) seen in hospital    Patient arrived to ER on 09/20/19 at 1931  Patient coming from: home Lives   With family    Chief Complaint:   Chief Complaint  Patient presents with  . Weakness    HPI: Patricia Stanley is a 74 y.o. female with medical history significant of CVA in December, carotid artery stenosis status post right CEA 04/2019,depression, GERD, kidney stones, HTN, anemia, Chronic diarrhea    Presented with   left arm weakness numbness as well as left leg weakness.  Going on for past few days. Apparently per family she have had similar presentation in December and had residual left-sided weakness although she is currently able to ambulate still this morning family felt that she was more weak than her usual self when her physical therapist got there she could not complete physical therapy secondary to worsening weakness.  Patient have not had any falls no chest pain or shortness of breath no nausea vomiting no diarrhea no slurred speech  Patient was just recently admitted for adult failure to thrive She was complaining of diarrhea at that time C. difficile was negative GI has been consulted recommend starting her on Creon 3 times a day and cholestyramine as well as Imodium as needed diarrhea seem to have improved and she was able to be discharged home she felt to be too frail to have endoscopic evaluation  Patient was found to be anemic and was given IV Feraheme in the hospital.  Since discharge her diarrhea have improved but now she is constipated She has had some back pain no dysuria  Infectious risk factors:  Reports none   Has  Not been vaccinated against COVID ( have not had a chance)   in house  PCR testing  Pending  Lab Results  Component Value Date   [place] 09/11/2019   [place] NEGATIVE 04/18/2019    Regarding pertinent Chronic problems:     Hyperlipidemia -  on statins Lipitor   HTN on atenolol lisinopril   chronic CHF diastolic  - last echo December 9833 Grade 2 diastolic dysfunction EF between 60 to 65%     Hx of CVA - with   residual deficits on Aspirin  325,     While in ER: On arrival to emergency department appear to have persistent left-sided weakness concern for CVA Not a candidate for TPA given unknown last time seen at baseline  ER Provider Called: Neurology   Dr. Cheral Marker They Recommend admit to medicine   Will see   in ER Recommended MRI  MRI showed 8 mm left basal ganglia infarct as well as disc bulge at C5-C6  Hospitalist was called for admission for CVA  The following Work up has been ordered so far:  Orders Placed This Encounter  Procedures  . CT Head Wo Contrast  . MR BRAIN WO CONTRAST  . MR Cervical Spine Wo Contrast  . Basic metabolic panel  . CBC with Differential  . Protime-INR  . Urinalysis, Routine w reflex microscopic  . Consult to neurology  ALL PATIENTS BEING ADMITTED/HAVING PROCEDURES NEED COVID-19 SCREENING  . Consult to neurology  ALL PATIENTS BEING ADMITTED/HAVING PROCEDURES NEED COVID-19 SCREENING  . Consult to hospitalist  ALL PATIENTS BEING ADMITTED/HAVING PROCEDURES NEED COVID-19 SCREENING  . CBG monitoring, ED  .  EKG 12-Lead  . Saline lock IV   Following Medications were ordered in ER: Medications  fentaNYL (SUBLIMAZE) injection 50 mcg (50 mcg Intravenous Given 09/20/19 2247)  ondansetron (ZOFRAN) injection 4 mg (4 mg Intravenous Given 09/20/19 2246)        Consult Orders  (From admission, onward)         Start     Ordered   09/20/19 2302  Consult to hospitalist  ALL PATIENTS BEING ADMITTED/HAVING PROCEDURES NEED COVID-19 SCREENING Paged by Starleen Blue  Once    Comments: ALL PATIENTS BEING ADMITTED/HAVING PROCEDURES NEED COVID-19 SCREENING  Provider:  (Not yet assigned)  Question Answer Comment  Place call to: [facility]   Reason for Consult Admit      09/20/19 2301          Significant initial  Findings: Abnormal Labs Reviewed  BASIC METABOLIC PANEL - Abnormal; Notable for the following components:      Result Value   Chloride 113 (*)    Calcium 8.8 (*)    GFR calc non Af Amer 56 (*)    All other components within normal limits  CBC WITH DIFFERENTIAL/PLATELET - Abnormal; Notable for the following components:   WBC 10.6 (*)    RBC 2.53 (*)    Hemoglobin 7.1 (*)    HCT 23.5 (*)    RDW 18.7 (*)    nRBC 0.8 (*)    Neutro Abs 8.2 (*)    Eosinophils Absolute 0.8 (*)    Abs Immature Granulocytes 0.08 (*)    All other components within normal limits  URINALYSIS, ROUTINE W REFLEX MICROSCOPIC - Abnormal; Notable for the following components:   APPearance HAZY (*)    Hgb urine dipstick MODERATE (*)    Protein, ur 100 (*)    Leukocytes,Ua TRACE (*)    Bacteria, UA RARE (*)    All other components within normal limits    Otherwise labs showing:   Recent Labs  Lab 09/14/19 0251 09/15/19 0734 09/16/19 0248 09/20/19 2013  NA 135 137 136 141  K 3.4* 3.7 3.9 4.7  CO2 _0 GLUCOSE 113* 101* 99 95  BUN _1 CREATININE 0.84 0.72 0.72 1.00  CALCIUM 7.8* 8.1* 7.8* 8.8*  MG  --  1.3* 2.2  --    Cr    Up from baseline see below Lab Results  Component Value Date   CREATININE 1.00 09/20/2019   CREATININE 0.72 09/16/2019   CREATININE 0.72 09/15/2019    No results for input(s): AST, ALT, ALKPHOS, BILITOT, PROT, ALBUMIN in the last 168 hours. Lab Results  Component Value Date   CALCIUM 8.8 (L) 09/20/2019   PHOS 2.9 09/12/2019    WBC      Component Value Date/Time   WBC 10.6 (H) 09/20/2019 2013   ANC    Component Value Date/Time   NEUTROABS 8.2 (H) 09/20/2019 2013   ALC No components found for: LYMPHAB    Plt: Lab Results  Component Value Date   PLT 313 09/20/2019    COVID-19 Labs  No results for input(s): DDIMER, FERRITIN, LDH, CRP in the last 72  hours.  Lab Results  Component Value Date   SARSCOV2NAA NEGATIVE 09/11/2019   [place] NEGATIVE 04/18/2019      HG/HCT  Down   from baseline see below    Component Value Date/Time   HGB 7.1 (L) 09/20/2019 2013   HCT 23.5 (L) 09/20/2019 2013    No results  for input(s): LIPASE, AMYLASE in the last 168 hours. No results for input(s): AMMONIA in the last 168 hours.  No components found for: LABALBU    ECG: Ordered Personally reviewed by me showing: HR : 72 Rhythm:  NSR,  no evidence of ischemic changes QTC 462   DM  labs:  HbA1C: Recent Labs    04/19/19 0414 09/12/19 0347  HGBA1C 5.8* 5.7*     CBG (last 3)  Recent Labs    09/20/19 1936  GLUCAP 93       UA pyuria   Urine analysis:    Component Value Date/Time   COLORURINE YELLOW 09/20/2019 2016   APPEARANCEUR HAZY (A) 09/20/2019 2016   LABSPEC 1.018 09/20/2019 2016   PHURINE 5.0 09/20/2019 2016   GLUCOSEU NEGATIVE 09/20/2019 2016   HGBUR MODERATE (A) 09/20/2019 2016   BILIRUBINUR NEGATIVE 09/20/2019 2016   [place] 09/20/2019 2016   PROTEINUR 100 (A) 09/20/2019 2016   NITRITE NEGATIVE 09/20/2019 2016   LEUKOCYTESUR TRACE (A) 09/20/2019 2016      Ordered  CT HEAD   NON acute  CXR -bilateral groundglass opacities KUB -nonacute  MRI - 8 mm left caudate acute ischemic infarct    ED Triage Vitals  Enc Vitals Group     BP 09/20/19 1937 (!) 178/65     Pulse Rate 09/20/19 1936 72     Resp 09/20/19 1936 19     Temp 09/20/19 1942 97.9 F (36.6 C)     Temp Source 09/20/19 1942 Oral     SpO2 09/20/19 1936 94 %     Weight --      Height --      Head Circumference --      Peak Flow --      Pain Score 09/20/19 2015 0     Pain Loc --      Pain Edu? --      Excl. in [place]? --   TMAX(24)@       Latest  Blood pressure (!) 155/77, pulse 72, temperature 97.9 F (36.6 C), temperature source Oral, resp. rate 14, SpO2 97 %.    Review of Systems:    Pertinent positives include:  fatigue,   localizing neurological complaints,   Tingling weakness,  Constitutional:  No weight loss, night sweats, Fevers, chills,weight loss  HEENT:  No headaches, Difficulty swallowing,Tooth/dental problems,Sore throat,  No sneezing, itching, ear ache, nasal congestion, post nasal drip,  Cardio-vascular:  No chest pain, Orthopnea, PND, anasarca, dizziness, palpitations.no Bilateral lower extremity swelling  GI:  No heartburn, indigestion, abdominal pain, nausea, vomiting, diarrhea, change in bowel habits, loss of appetite, melena, blood in stool, hematemesis Resp:  no shortness of breath at rest. No dyspnea on exertion, No excess mucus, no productive cough, No non-productive cough, No coughing up of blood.No change in color of mucus.No wheezing. Skin:  no rash or lesions. No jaundice GU:  no dysuria, change in color of urine, no urgency or frequency. No straining to urinate.  No flank pain.  Musculoskeletal:  No joint pain or no joint swelling. No decreased range of motion. No back pain.  Psych:  No change in mood or affect. No depression or anxiety. No memory loss.  Neuro: no no double vision, no gait abnormality, no slurred speech, no confusion  All systems reviewed and apart from [employer] all are negative  Past Medical History:   Past Medical History:  Diagnosis Date  . Closed fracture of coccyx ([place]) 2015   AFTER FALL  .  Depression   . GERD (gastroesophageal reflux disease)   . Headache    MIGRAINES OCCASIONAL  . History of kidney stones    HAS NOW  . Hypertension      Past Surgical History:  Procedure Laterality Date  . ABDOMINAL HYSTERECTOMY     1 OVARY LEFT  . APPENDECTOMY  AGE 57   DONE WITH OVARY REMOVAL  . COLONOSCOPY WITH PROPOFOL N/A 12/03/2015   Procedure: COLONOSCOPY WITH PROPOFOL;  Surgeon: Garlan Fair, MD;  Location: [facility];  Service: Endoscopy;  Laterality: N/A;  . ENDARTERECTOMY Right 04/21/2019   Procedure: ENDARTERECTOMY CAROTID;  Surgeon: Marty Heck, MD;  Location: [place];  Service: Vascular;  Laterality: Right;  . ESOPHAGOGASTRODUODENOSCOPY (EGD) WITH PROPOFOL N/A 12/03/2015   Procedure: ESOPHAGOGASTRODUODENOSCOPY (EGD) WITH PROPOFOL;  Surgeon: Garlan Fair, MD;  Location: [facility];  Service: Endoscopy;  Laterality: N/A;  . TONSILLECTOMY  AGE 39   AND ADENOIDS REMOVED    Social History:  Ambulatory   walker   /wheelchair    reports that she quit smoking about 24 years ago. She has never used smokeless tobacco. She reports that she does not drink alcohol or use drugs.   Family History:   Family History  Problem Relation Age of Onset  . Hypertension Mother   . Hyperlipidemia Mother   . Hypertension Father   . Hyperlipidemia Father     Allergies: Allergies  Allergen Reactions  . Antihistamines, Chlorpheniramine-Type Other (See Comments)    Makes her heart race  . Paroxetine Other (See Comments)    Shakes-tremors     Prior to Admission medications   Medication Sig Start Date End Date Taking? Authorizing Provider  aspirin 325 MG tablet Take 1 tablet (325 mg total) by mouth daily. 04/26/19  Yes Swayze, Ava, DO  atenolol (TENORMIN) 50 MG tablet Take 1 tablet (50 mg total) by mouth every evening. 05/09/19  Yes Angiulli, Lavon Paganini, PA-C  atorvastatin (LIPITOR) 80 MG tablet Take 1 tablet (80 mg total) by mouth daily at 6 PM. 05/09/19  Yes Angiulli, Lavon Paganini, PA-C  cholestyramine (QUESTRAN) 4 g packet Take 1 packet (4 g total) by mouth 2 (two) times daily. 09/16/19 12/15/19 Yes [place] Eric J, DO  clonazePAM (KLONOPIN) 1 MG tablet 1 tab every a.m. and 2 tabs nightly Patient taking differently: Take 1 mg by mouth in the morning and at bedtime.  05/09/19  Yes Angiulli, Lavon Paganini, PA-C  ferrous sulfate 325 (65 FE) MG tablet Take 1 tablet (325 mg total) by mouth 2 (two) times daily with a meal. 09/16/19 12/15/19 Yes [place] Donnamarie Poag, DO  lipase/protease/amylase (CREON) 36000 UNITS CPEP capsule Take 1 capsule (36,000 Units  total) by mouth 3 (three) times daily with meals. 09/16/19 12/15/19 Yes [place] Eric J, DO  lisinopril (ZESTRIL) 10 MG tablet Take 1 tablet (10 mg total) by mouth daily. 05/09/19  Yes Angiulli, Lavon Paganini, PA-C  loperamide (IMODIUM) 2 MG capsule Take 1 capsule (2 mg total) by mouth as needed for diarrhea or loose stools. 09/16/19  Yes [place] Eric J, DO  mirtazapine (REMERON) 15 MG tablet Take 1 tablet (15 mg total) by mouth at bedtime. 09/16/19 12/15/19 Yes [place] Eric J, DO  ondansetron (ZOFRAN) 4 MG tablet Take 1 tablet (4 mg total) by mouth 3 (three) times daily before meals. 09/16/19 12/15/19 Yes [place] Eric J, DO  pantoprazole (PROTONIX) 40 MG tablet Take 1 tablet (40 mg total) by mouth 2 (two) times daily before a meal.  05/09/19  Yes Angiulli, Lavon Paganini, PA-C   Physical Exam: Blood pressure (!) 155/77, pulse 72, temperature 97.9 F (36.6 C), temperature source Oral, resp. rate 14, SpO2 97 %. 1. General:  in No  Acute distress   Chronically ill -appearing 2. Psychological: Alert and  Oriented to self 3. Head/ENT:     Dry Mucous Membranes                          Head Non traumatic, neck supple                            Poor Dentition 4. SKIN:   decreased Skin turgor,  Skin clean Dry and intact no rash 5. Heart: Regular rate and rhythm no  Murmur, no Rub or gallop 6. Lungs:   no wheezes or crackles   7. Abdomen: Soft,  non-tender, Non distended bowel sounds present 8. Lower extremities: no clubbing, cyanosis, no edema 9. Neurologically left side weakness 10. MSK: Normal range of motion   All other LABS:     Recent Labs  Lab 09/14/19 0251 09/15/19 0734 09/16/19 0248 09/20/19 2013  WBC 10.1 11.7* 12.8* 10.6*  NEUTROABS  --   --   --  8.2*  HGB 7.3* 7.6* 7.2* 7.1*  HCT 23.6* 23.8* 22.8* 23.5*  MCV 89.4 86.9 88.7 92.9  PLT 240 289 276 313     Recent Labs  Lab 09/14/19 0251 09/15/19 0734 09/16/19 0248 09/20/19 2013  NA 135 137 136 141  K 3.4* 3.7 3.9 4.7  CL 105 104 106  113*  CO2 _0 GLUCOSE 113* 101* 99 95  BUN _1 CREATININE 0.84 0.72 0.72 1.00  CALCIUM 7.8* 8.1* 7.8* 8.8*  MG  --  1.3* 2.2  --      No results for input(s): AST, ALT, ALKPHOS, BILITOT, PROT, ALBUMIN in the last 168 hours.     Cultures:    Component Value Date/Time   SDES URINE, RANDOM 04/30/2019 1738   SPECREQUEST NONE 04/30/2019 1738   CULT (A) 04/30/2019 1738    <10,000 COLONIES/mL INSIGNIFICANT GROWTH Performed at [facility] [address], [place], [place] [address]    REPTSTATUS 05/01/2019 FINAL 04/30/2019 1738     Radiological Exams on Admission: CT Head Wo Contrast  Result Date: 09/20/2019 CLINICAL DATA:  Left hand paralysis and numbness EXAM: CT HEAD WITHOUT CONTRAST TECHNIQUE: Contiguous axial images were obtained from the base of the skull through the vertex without intravenous contrast. COMPARISON:  MRI 04/18/2019, CT brain 04/18/2019 FINDINGS: Brain: No acute territorial infarction, hemorrhage or intracranial mass. Atrophy and chronic small vessel ischemic change of the white matter. Multiple bilateral chronic infarcts involving the white matter, basal ganglia and thalamus. Small focal hypodensity within the left cerebellum, not clearly seen on the previous exam and potentially representing age indeterminate lacunar infarct. Stable ventricle size. Vascular: No hyperdense vessels.  Carotid vascular calcification Skull: Normal. Negative for fracture or focal lesion. Sinuses/Orbits: No acute finding. Other: None IMPRESSION: 1. Negative for intracranial hemorrhage or territorial infarction. 2. Small focal hypodensity in the left cerebellum, may reflect age indeterminate lacunar infarct. 3. Atrophy, chronic small vessel ischemic changes of the white matter and multiple bilateral chronic appearing lacunar infarcts. Electronically Signed   By: Donavan Foil M.D.   On: 09/20/2019 20:09   MR BRAIN WO CONTRAST  Result Date: 09/20/2019  CLINICAL DATA:   Initial evaluation for acute left upper extremity weakness. EXAM: MRI HEAD WITHOUT CONTRAST MRI CERVICAL SPINE WITHOUT CONTRAST TECHNIQUE: Multiplanar, multiecho pulse sequences of the brain and surrounding structures, and cervical spine, to include the craniocervical junction and cervicothoracic junction, were obtained without intravenous contrast. COMPARISON:  Comparison made with prior CT from earlier the same day. FINDINGS: MRI HEAD FINDINGS Brain: Generalized age-related cerebral atrophy. Extensive chronic microvascular ischemic disease with multiple remote lacunar infarcts seen clustered about the bilateral basal ganglia and thalami. Patchy involvement of the pons noted as well. 8 mm focus of restricted diffusion involving the left caudate consistent with an acute ischemic infarct (series 5, image 8). Associated susceptibility artifact suggests petechial hemorrhage without frank hemorrhagic transformation. No additional punctate 4 mm cortical infarct noted at the left occipital lobe (a series 5, image 76). No associated hemorrhage or mass effect. Additional faint subcentimeter diffusion abnormality at the high right frontal centrum semi ovale consistent with subacute small vessel ischemia. Mild residual diffusion abnormality seen about a largely chronic appearing lacunar infarct at the posterior right basal ganglia. Additional 12 mm vague focus of diffusion abnormality involving the left cerebellum consistent with a subacute left cerebellar infarct. No associated hemorrhage or mass effect. Multiple scattered chronic micro hemorrhages seen involving both cerebral and cerebellar hemispheres, most notable about the deep gray nuclei, favored to be related to chronic poorly controlled hypertension. Sequelae of cerebral amyloid would be the primary differential consideration. No mass lesion, midline shift or mass effect. No hydrocephalus. No extra-axial fluid collection. Pituitary gland suprasellar region normal.  Midline structures intact. Vascular: Major intracranial vascular flow voids are maintained. Skull and upper cervical spine: Craniocervical junction within normal limits. Bone marrow signal intensity normal. No scalp soft tissue abnormality. Sinuses/Orbits: Globes and orbital soft tissues within normal limits. Paranasal sinuses are largely clear. No significant mastoid effusion. Other: None. MRI CERVICAL SPINE FINDINGS Alignment: Vertebral bodies normally aligned with preservation of the normal cervical lordosis. No listhesis. Vertebrae: Vertebral body height maintained without evidence for acute or chronic fracture. Bone marrow signal intensity somewhat heterogeneous but within normal limits. No discrete or worrisome osseous lesions. No abnormal marrow edema. Cord: Signal intensity within the cervical spinal cord is within normal limits. Posterior Fossa, vertebral arteries, paraspinal tissues: Craniocervical junction within normal limits. Paraspinous and prevertebral soft tissues normal. Normal flow voids seen within the vertebral arteries bilaterally. Disc levels: C2-C3: Unremarkable. C3-C4: Broad central disc protrusion indents the ventral thecal sac, partially effacing the ventral CSF. Mild spinal stenosis without cord deformity. Superimposed uncovertebral hypertrophy without significant foraminal encroachment. C4-C5: Diffuse degenerative disc osteophyte with bilateral uncovertebral hypertrophy. Broad posterior component flattens and effaces the ventral thecal sac, greater on the left. Superimposed mild facet and ligament flavum hypertrophy. Resultant moderate spinal stenosis with mild cord flattening. Left greater than right uncinate spurring with resultant moderate left worse than right C5 foraminal stenosis. C5-C6: Chronic intervertebral disc space narrowing with diffuse degenerative disc osteophyte. Broad posterior component flattens and partially faces the ventral thecal sac resultant mild spinal stenosis.  No cord deformity. Severe right with mild left C6 foraminal stenosis. C6-C7: Mild disc bulge with uncovertebral hypertrophy. No significant spinal stenosis. Moderate right C7 foraminal stenosis. No significant left foraminal narrowing. C7-T1:  Unremarkable. Visualized upper thoracic spine demonstrates no significant finding. IMPRESSION: MRI HEAD IMPRESSION: 1. 8 mm acute ischemic left basal ganglia infarct. Associated petechial hemorrhage without frank hemorrhagic transformation. 2. Additional small volume subacute ischemic changes involving the high right frontal centrum semi  ovale and left cerebellum as above. 3. Underlying severe chronic microvascular ischemic disease with multiple remote lacunar infarcts involving the deep gray nuclei and pons. 4. Multiple scattered chronic micro hemorrhages involving both cerebral and cerebellar hemispheres, most pronounced about the deep gray nuclei. Changes related to chronic poorly controlled hypertension is favored. Cerebral amyloid would be the primary differential consideration. MRI CERVICAL SPINE IMPRESSION: 1. Degenerative disc osteophyte at C4-5 with resultant moderate spinal stenosis, with moderate left worse than right C5 foraminal narrowing. Finding could contribute to left-sided radicular symptoms. 2. Disc bulge with uncovertebral spurring at C5-6 and C6-7 with resultant moderate to severe right C6 and C7 foraminal stenosis. 3. Broad-based central disc protrusion at C3-4 with resultant mild spinal stenosis. Electronically Signed   By: Jeannine Boga M.D.   On: 09/20/2019 22:41   MR Cervical Spine Wo Contrast  Result Date: 09/20/2019 CLINICAL DATA:  Initial evaluation for acute left upper extremity weakness. EXAM: MRI HEAD WITHOUT CONTRAST MRI CERVICAL SPINE WITHOUT CONTRAST TECHNIQUE: Multiplanar, multiecho pulse sequences of the brain and surrounding structures, and cervical spine, to include the craniocervical junction and cervicothoracic junction, were  obtained without intravenous contrast. COMPARISON:  Comparison made with prior CT from earlier the same day. FINDINGS: MRI HEAD FINDINGS Brain: Generalized age-related cerebral atrophy. Extensive chronic microvascular ischemic disease with multiple remote lacunar infarcts seen clustered about the bilateral basal ganglia and thalami. Patchy involvement of the pons noted as well. 8 mm focus of restricted diffusion involving the left caudate consistent with an acute ischemic infarct (series 5, image 8). Associated susceptibility artifact suggests petechial hemorrhage without frank hemorrhagic transformation. No additional punctate 4 mm cortical infarct noted at the left occipital lobe (a series 5, image 76). No associated hemorrhage or mass effect. Additional faint subcentimeter diffusion abnormality at the high right frontal centrum semi ovale consistent with subacute small vessel ischemia. Mild residual diffusion abnormality seen about a largely chronic appearing lacunar infarct at the posterior right basal ganglia. Additional 12 mm vague focus of diffusion abnormality involving the left cerebellum consistent with a subacute left cerebellar infarct. No associated hemorrhage or mass effect. Multiple scattered chronic micro hemorrhages seen involving both cerebral and cerebellar hemispheres, most notable about the deep gray nuclei, favored to be related to chronic poorly controlled hypertension. Sequelae of cerebral amyloid would be the primary differential consideration. No mass lesion, midline shift or mass effect. No hydrocephalus. No extra-axial fluid collection. Pituitary gland suprasellar region normal. Midline structures intact. Vascular: Major intracranial vascular flow voids are maintained. Skull and upper cervical spine: Craniocervical junction within normal limits. Bone marrow signal intensity normal. No scalp soft tissue abnormality. Sinuses/Orbits: Globes and orbital soft tissues within normal limits.  Paranasal sinuses are largely clear. No significant mastoid effusion. Other: None. MRI CERVICAL SPINE FINDINGS Alignment: Vertebral bodies normally aligned with preservation of the normal cervical lordosis. No listhesis. Vertebrae: Vertebral body height maintained without evidence for acute or chronic fracture. Bone marrow signal intensity somewhat heterogeneous but within normal limits. No discrete or worrisome osseous lesions. No abnormal marrow edema. Cord: Signal intensity within the cervical spinal cord is within normal limits. Posterior Fossa, vertebral arteries, paraspinal tissues: Craniocervical junction within normal limits. Paraspinous and prevertebral soft tissues normal. Normal flow voids seen within the vertebral arteries bilaterally. Disc levels: C2-C3: Unremarkable. C3-C4: Broad central disc protrusion indents the ventral thecal sac, partially effacing the ventral CSF. Mild spinal stenosis without cord deformity. Superimposed uncovertebral hypertrophy without significant foraminal encroachment. C4-C5: Diffuse degenerative disc osteophyte with bilateral uncovertebral  hypertrophy. Broad posterior component flattens and effaces the ventral thecal sac, greater on the left. Superimposed mild facet and ligament flavum hypertrophy. Resultant moderate spinal stenosis with mild cord flattening. Left greater than right uncinate spurring with resultant moderate left worse than right C5 foraminal stenosis. C5-C6: Chronic intervertebral disc space narrowing with diffuse degenerative disc osteophyte. Broad posterior component flattens and partially faces the ventral thecal sac resultant mild spinal stenosis. No cord deformity. Severe right with mild left C6 foraminal stenosis. C6-C7: Mild disc bulge with uncovertebral hypertrophy. No significant spinal stenosis. Moderate right C7 foraminal stenosis. No significant left foraminal narrowing. C7-T1:  Unremarkable. Visualized upper thoracic spine demonstrates no  significant finding. IMPRESSION: MRI HEAD IMPRESSION: 1. 8 mm acute ischemic left basal ganglia infarct. Associated petechial hemorrhage without frank hemorrhagic transformation. 2. Additional small volume subacute ischemic changes involving the high right frontal centrum semi ovale and left cerebellum as above. 3. Underlying severe chronic microvascular ischemic disease with multiple remote lacunar infarcts involving the deep gray nuclei and pons. 4. Multiple scattered chronic micro hemorrhages involving both cerebral and cerebellar hemispheres, most pronounced about the deep gray nuclei. Changes related to chronic poorly controlled hypertension is favored. Cerebral amyloid would be the primary differential consideration. MRI CERVICAL SPINE IMPRESSION: 1. Degenerative disc osteophyte at C4-5 with resultant moderate spinal stenosis, with moderate left worse than right C5 foraminal narrowing. Finding could contribute to left-sided radicular symptoms. 2. Disc bulge with uncovertebral spurring at C5-6 and C6-7 with resultant moderate to severe right C6 and C7 foraminal stenosis. 3. Broad-based central disc protrusion at C3-4 with resultant mild spinal stenosis. Electronically Signed   By: Jeannine Boga M.D.   On: 09/20/2019 22:41    Chart has been reviewed    Assessment/Plan  74 y.o. female with medical history significant of CVA in December, carotid artery stenosis status post right CEA 04/2019,depression, GERD, kidney stones, HTN, anemia, Chronic diarrhea    Admitted for new basilar CVA  Present on Admission: . CVA (cerebral vascular accident) ([place]) -  - will admit based on TIA/CVA protocol        Monitor on Tele        MRI  Resulted - showing acute ischemic CVA              Echo to evaluate for possible embolic source,        obtain cardiac enzymes,  ECG,   Lipid panel, TSH.        Order PT/OT evaluation.      keep nothing by mouth until passes swallow eval         Will make sure patient  is on antiplatelet ASA 325   and statin          Allow permissive Hypertension keep BP <220/120        Neurology consulted Have seen pt in ER  will follow up in AM  . Major depression in partial remission ([place]) chronic stable continue home medications  . HTN (hypertension) allow permissive hypertension for tonight   . Malnutrition of moderate degree order nutritional consult Check prealbumin .constipation -KUB showed no evidence of significant stool burden   . Physical deconditioning -will need PT OT evaluation prior to discharge possibly placement  . Symptomatic anemia -transfuse 1 unit, patient has been recently admitted to the hospital for iron deficiency anemia at the time felt to be too frail to undergo further GI work-up  . Generalized body aches -check electrolytes check Covid status patient has not been  vaccinated atypical viral groundglass opacities findings on chest x-ray  Bronchiectasis versus atypical pneumonia -patient did not endorse any fevers although family states she occasionally feels chills.  She is cachectic she did not endorse shortness of breath.  Did not report coughing. To suggest pneumonia Will obtain CT chest to further evaluate  will await results of Covid test she would likely benefit from repeat imaging of her chest to further evaluate progression of her bronchiectasis in which case she may need follow-up with pulmonology Would also test her for MAC For now given no clear-cut picture of bacterial pneumonia we will hold off on antibiotics for tonight  Other plan as per orders.  DVT prophylaxis:  SCD     Code Status:    DNR/DNI  as per patient   I had personally discussed CODE STATUS with patient and family   Family Communication:   Family not at  Bedside  plan of care was discussed on the phone with   Daughter   Disposition Plan:                            Back to current facility when stable                               Following barriers for  discharge:                            Electrolytes corrected                             Pain controlled with PO medications                               Afebrile, white count improving able to transition to PO antibiotics                             Will need to be able to tolerate PO                            Will likely need home health, home O2, set up                           Will need consultants to evaluate patient prior to discharge                       Would benefit from PT/OT eval prior to DC  Ordered                   Swallow eval - SLP ordered                                       Transition of care consulted                   Nutrition    consulted  Consults called: Neurology have seen her in consult  Admission status:  ED Disposition    ED Disposition Condition [place]: [place] [[id number]]  Level of Care: Telemetry Medical [104]  May admit patient to [facility] or [facility] if equivalent level of care is available:: No  Covid Evaluation: Symptomatic Person Under Investigation (PUI)  Diagnosis: CVA (cerebral vascular accident) [facility]) [[id number]]  Admitting Physician: Toy Baker [[id number]]  Attending Physician: Toy Baker [[id number]]  Estimated length of stay: past midnight tomorrow  Certification:: I certify this patient will need inpatient services for at least 2 midnights         inpatient     I Expect 2 midnight stay secondary to severity of patient's current illness need for inpatient interventions justified by the following:   Severe lab/radiological/exam abnormalities including:   New stroke and possible atypical lung disease  and extensive comorbidities including:  history of stroke with residual deficits   That are currently affecting medical management.   I expect  patient to be hospitalized for 2 midnights requiring inpatient medical care.  Patient is at high risk  for adverse outcome (such as loss of life or disability) if not treated.  Indication for inpatient stay as follows:      Need for , IV fluids,      Level of care    tele  For 12H    Precautions: admitted as   symptomatic screening protocol on airborne precautions PPE: Used by the provider:   P100  eye Goggles,  Gloves    Phyllis Whitefield 09/21/2019, 2:37 AM    [facility]     after 2 AM please page floor coverage PA If 7AM-7PM, please contact the day team taking care of the patient using [email or web address]   Patient was evaluated in the context of the global COVID-19 pandemic, which necessitated consideration that the patient might be at risk for infection with the SARS-CoV-2 virus that causes COVID-19. Institutional protocols and algorithms that pertain to the evaluation of patients at risk for COVID-19 are in a state of rapid change based on information released by regulatory bodies including the CDC and federal and state organizations. These policies and algorithms were followed during the patient's care.

## 2019-09-20 NOTE — ED Provider Notes (Signed)
[facility]   CSN: [id number] Arrival date & time: 09/20/19  1931     History Chief Complaint  Patient presents with  . Weakness    Patricia SaltsMartha J Stanley is a 74 y.o. female brought in by EMS for evaluation of worsening left upper extremity weakness, numbness and worsening left lower extremity weakness.  Unclear of how long this is been going on.  Husband reports that since her stroke in December, she has had some left-sided hemiparesis but is still been able to assist with getting up and standing while she is in PT.  This morning at 9 AM, she was having more difficulty than normal.  When PT got there, she was unable to assist in getting up and was complaining of worsening weakness and numbness in her left upper extremity.  Patient states that she feels like she has been getting weaker over the last few days though she cannot pinpoint when this started.  He feels like the numbness started today.  Patient denies any falls.  She thinks she is on blood thinners but is unsure.  She denies any vision changes, chest pain, difficulty breathing, abdominal pain, nausea/vomiting, difficulty speaking.  The history is provided by the patient.       Past Medical History:  Diagnosis Date  . Closed fracture of coccyx (HCC) 2015   AFTER FALL  . Depression   . GERD (gastroesophageal reflux disease)   . Headache    MIGRAINES OCCASIONAL  . History of kidney stones    HAS NOW  . Hypertension     Patient Active Problem List   Diagnosis Date Noted  . Palliative care by specialist   . Goals of care, counseling/discussion   . DNR (do not resuscitate)   . Anemia 09/12/2019  . Physical deconditioning 09/12/2019  . Malnutrition of moderate degree 09/12/2019  . Diarrhea 09/11/2019  . Moderate episode of recurrent major depressive disorder (HCC)   . Small vessel cerebrovascular accident (CVA) (HCC) 04/25/2019  . Acute CVA (cerebrovascular accident)  (HCC) 04/18/2019  . Weight loss, unintentional 04/18/2019  . Anxiety 04/18/2019  . Major depression in partial remission (HCC) 08/25/2012  . Osteoporosis, unspecified 08/25/2012  . HTN (hypertension) 08/25/2012  . Fall at home 08/24/2012  . Pelvic fracture (HCC) 08/24/2012  . Migraines 08/24/2012  . Renal insufficiency, mild 08/24/2012    Past Surgical History:  Procedure Laterality Date  . ABDOMINAL HYSTERECTOMY     1 OVARY LEFT  . APPENDECTOMY  AGE 40   DONE WITH OVARY REMOVAL  . COLONOSCOPY WITH PROPOFOL N/A 12/03/2015   Procedure: COLONOSCOPY WITH PROPOFOL;  Surgeon: Charolett BumpersMartin K Johnson, MD;  Location: [facility] ENDOSCOPY;  Service: Endoscopy;  Laterality: N/A;  . ENDARTERECTOMY Right 04/21/2019   Procedure: ENDARTERECTOMY CAROTID;  Surgeon: Cephus Shellinglark, Christopher J, MD;  [facility];  Service: Vascular;  Laterality: Right;  . ESOPHAGOGASTRODUODENOSCOPY (EGD) WITH PROPOFOL N/A 12/03/2015   Procedure: ESOPHAGOGASTRODUODENOSCOPY (EGD) WITH PROPOFOL;  Surgeon: Charolett BumpersMartin K Johnson, MD;  Location: [facility] ENDOSCOPY;  Service: Endoscopy;  Laterality: N/A;  . TONSILLECTOMY  AGE 69   AND ADENOIDS REMOVED     OB History   No obstetric history on file.     Family History  Problem Relation Age of Onset  . Hypertension Mother   . Hyperlipidemia Mother   . Hypertension Father   . Hyperlipidemia Father     Social History   Tobacco Use  . Smoking status: Former Engineer, civil (consulting)moker    Quit  date: 05/06/1995    Years since quitting: 24.3  . Smokeless tobacco: Never Used  Substance Use Topics  . Alcohol use: No  . Drug use: No    Home Medications Prior to Admission medications   Medication Sig Start Date End Date Taking? Authorizing Provider  aspirin 325 MG tablet Take 1 tablet (325 mg total) by mouth daily. 04/26/19  Yes Swayze, Ava, DO  atenolol (TENORMIN) 50 MG tablet Take 1 tablet (50 mg total) by mouth every evening. 05/09/19  Yes Angiulli, Mcarthur Rossetti, PA-C  atorvastatin (LIPITOR) 80 MG tablet Take 1 tablet  (80 mg total) by mouth daily at 6 PM. 05/09/19  Yes Angiulli, Mcarthur Rossetti, PA-C  cholestyramine (QUESTRAN) 4 g packet Take 1 packet (4 g total) by mouth 2 (two) times daily. 09/16/19 12/15/19 Yes [place], Eric J, DO  clonazePAM (KLONOPIN) 1 MG tablet 1 tab every a.m. and 2 tabs nightly Patient taking differently: Take 1 mg by mouth in the morning and at bedtime.  05/09/19  Yes Angiulli, Mcarthur Rossetti, PA-C  ferrous sulfate 325 (65 FE) MG tablet Take 1 tablet (325 mg total) by mouth 2 (two) times daily with a meal. 09/16/19 12/15/19 Yes [place], Alvira Philips, DO  lipase/protease/amylase (CREON) 36000 UNITS CPEP capsule Take 1 capsule (36,000 Units total) by mouth 3 (three) times daily with meals. 09/16/19 12/15/19 Yes [place], Eric J, DO  lisinopril (ZESTRIL) 10 MG tablet Take 1 tablet (10 mg total) by mouth daily. 05/09/19  Yes Angiulli, Mcarthur Rossetti, PA-C  loperamide (IMODIUM) 2 MG capsule Take 1 capsule (2 mg total) by mouth as needed for diarrhea or loose stools. 09/16/19  Yes [place], Eric J, DO  mirtazapine (REMERON) 15 MG tablet Take 1 tablet (15 mg total) by mouth at bedtime. 09/16/19 12/15/19 Yes [place], Eric J, DO  ondansetron (ZOFRAN) 4 MG tablet Take 1 tablet (4 mg total) by mouth 3 (three) times daily before meals. 09/16/19 12/15/19 Yes [place], Eric J, DO  pantoprazole (PROTONIX) 40 MG tablet Take 1 tablet (40 mg total) by mouth 2 (two) times daily before a meal. 05/09/19  Yes Angiulli, Mcarthur Rossetti, PA-C    Allergies    Antihistamines, chlorpheniramine-type and Paroxetine  Review of Systems   Review of Systems  Constitutional: Negative for fever.  Eyes: Negative for visual disturbance.  Respiratory: Negative for cough and shortness of breath.   Cardiovascular: Negative for chest pain.  Gastrointestinal: Negative for abdominal pain, nausea and vomiting.  Genitourinary: Negative for dysuria and hematuria.  Neurological: Positive for weakness and numbness. Negative for facial asymmetry, speech difficulty and headaches.   All other systems reviewed and are negative.   Physical Exam Updated Vital Signs BP (!) 155/77   Pulse 72   Temp 97.9 F (36.6 C) (Oral)   Resp 14   SpO2 97%   Physical Exam Vitals and nursing note reviewed.  Constitutional:      Appearance: Normal appearance. She is well-developed.  HENT:     Head: Normocephalic and atraumatic.  Eyes:     General: Lids are normal.     Conjunctiva/sclera: Conjunctivae normal.     Pupils: Pupils are equal, round, and reactive to light.     Comments: PERRL. EOMs intact. No nystagmus. No neglect.   Cardiovascular:     Rate and Rhythm: Normal rate and regular rhythm.     Pulses: Normal pulses.          Radial pulses are 2+ on the right side and 2+ on the left  side.     Heart sounds: Normal heart sounds. No murmur. No friction rub. No gallop.   Pulmonary:     Effort: Pulmonary effort is normal.     Breath sounds: Normal breath sounds.     Comments: Lungs clear to auscultation bilaterally.  Symmetric chest rise.  No wheezing, rales, rhonchi. Abdominal:     Palpations: Abdomen is soft. Abdomen is not rigid.     Tenderness: There is no abdominal tenderness. There is no guarding.  Musculoskeletal:        General: Normal range of motion.     Cervical back: Full passive range of motion without pain.  Skin:    General: Skin is warm and dry.     Capillary Refill: Capillary refill takes less than 2 seconds.  Neurological:     Mental Status: She is alert and oriented to person, place, and time.     Comments: Cranial nerves III-XII intact Follows commands, Moves all extremities  5/5 strength to RUE and RLE. 3/5 strength of LUE and LLE.  Decreased grip strength noted on left. Wrist drop noted.  LLE with difficulty with plantarflexion.  Decreased sensation noted from the forearm that extends distally. Sensation otherwise intact throughout all major nerve distributions Left sided pronator drift  No gait abnormalities  No slurred speech. No facial  droop.   Psychiatric:        Speech: Speech normal.     ED Results / Procedures / Treatments   Labs (all labs ordered are listed, but only abnormal results are displayed) Labs Reviewed  BASIC METABOLIC PANEL - Abnormal; Notable for the following components:      Result Value   Chloride 113 (*)    Calcium 8.8 (*)    GFR calc non Af Amer 56 (*)    All other components within normal limits  CBC WITH DIFFERENTIAL/PLATELET - Abnormal; Notable for the following components:   WBC 10.6 (*)    RBC 2.53 (*)    Hemoglobin 7.1 (*)    HCT 23.5 (*)    RDW 18.7 (*)    nRBC 0.8 (*)    Neutro Abs 8.2 (*)    Eosinophils Absolute 0.8 (*)    Abs Immature Granulocytes 0.08 (*)    All other components within normal limits  URINALYSIS, ROUTINE W REFLEX MICROSCOPIC - Abnormal; Notable for the following components:   APPearance HAZY (*)    Hgb urine dipstick MODERATE (*)    Protein, ur 100 (*)    Leukocytes,Ua TRACE (*)    Bacteria, UA RARE (*)    All other components within normal limits  PROTIME-INR  CBG MONITORING, ED    EKG None  Radiology CT Head Wo Contrast  Result Date: 09/20/2019 CLINICAL DATA:  Left hand paralysis and numbness EXAM: CT HEAD WITHOUT CONTRAST TECHNIQUE: Contiguous axial images were obtained from the base of the skull through the vertex without intravenous contrast. COMPARISON:  MRI 04/18/2019, CT brain 04/18/2019 FINDINGS: Brain: No acute territorial infarction, hemorrhage or intracranial mass. Atrophy and chronic small vessel ischemic change of the white matter. Multiple bilateral chronic infarcts involving the white matter, basal ganglia and thalamus. Small focal hypodensity within the left cerebellum, not clearly seen on the previous exam and potentially representing age indeterminate lacunar infarct. Stable ventricle size. Vascular: No hyperdense vessels.  Carotid vascular calcification Skull: Normal. Negative for fracture or focal lesion. Sinuses/Orbits: No acute  finding. Other: None IMPRESSION: 1. Negative for intracranial hemorrhage or territorial infarction. 2. Small focal  hypodensity in the left cerebellum, may reflect age indeterminate lacunar infarct. 3. Atrophy, chronic small vessel ischemic changes of the white matter and multiple bilateral chronic appearing lacunar infarcts. Electronically Signed   By: Jasmine Pang M.D.   On: 09/20/2019 20:09   MR BRAIN WO CONTRAST  Result Date: 09/20/2019 CLINICAL DATA:  Initial evaluation for acute left upper extremity weakness. EXAM: MRI HEAD WITHOUT CONTRAST MRI CERVICAL SPINE WITHOUT CONTRAST TECHNIQUE: Multiplanar, multiecho pulse sequences of the brain and surrounding structures, and cervical spine, to include the craniocervical junction and cervicothoracic junction, were obtained without intravenous contrast. COMPARISON:  Comparison made with prior CT from earlier the same day. FINDINGS: MRI HEAD FINDINGS Brain: Generalized age-related cerebral atrophy. Extensive chronic microvascular ischemic disease with multiple remote lacunar infarcts seen clustered about the bilateral basal ganglia and thalami. Patchy involvement of the pons noted as well. 8 mm focus of restricted diffusion involving the left caudate consistent with an acute ischemic infarct (series 5, image 8). Associated susceptibility artifact suggests petechial hemorrhage without frank hemorrhagic transformation. No additional punctate 4 mm cortical infarct noted at the left occipital lobe (a series 5, image 76). No associated hemorrhage or mass effect. Additional faint subcentimeter diffusion abnormality at the high right frontal centrum semi ovale consistent with subacute small vessel ischemia. Mild residual diffusion abnormality seen about a largely chronic appearing lacunar infarct at the posterior right basal ganglia. Additional 12 mm vague focus of diffusion abnormality involving the left cerebellum consistent with a subacute left cerebellar infarct. No  associated hemorrhage or mass effect. Multiple scattered chronic micro hemorrhages seen involving both cerebral and cerebellar hemispheres, most notable about the deep gray nuclei, favored to be related to chronic poorly controlled hypertension. Sequelae of cerebral amyloid would be the primary differential consideration. No mass lesion, midline shift or mass effect. No hydrocephalus. No extra-axial fluid collection. Pituitary gland suprasellar region normal. Midline structures intact. Vascular: Major intracranial vascular flow voids are maintained. Skull and upper cervical spine: Craniocervical junction within normal limits. Bone marrow signal intensity normal. No scalp soft tissue abnormality. Sinuses/Orbits: Globes and orbital soft tissues within normal limits. Paranasal sinuses are largely clear. No significant mastoid effusion. Other: None. MRI CERVICAL SPINE FINDINGS Alignment: Vertebral bodies normally aligned with preservation of the normal cervical lordosis. No listhesis. Vertebrae: Vertebral body height maintained without evidence for acute or chronic fracture. Bone marrow signal intensity somewhat heterogeneous but within normal limits. No discrete or worrisome osseous lesions. No abnormal marrow edema. Cord: Signal intensity within the cervical spinal cord is within normal limits. Posterior Fossa, vertebral arteries, paraspinal tissues: Craniocervical junction within normal limits. Paraspinous and prevertebral soft tissues normal. Normal flow voids seen within the vertebral arteries bilaterally. Disc levels: C2-C3: Unremarkable. C3-C4: Broad central disc protrusion indents the ventral thecal sac, partially effacing the ventral CSF. Mild spinal stenosis without cord deformity. Superimposed uncovertebral hypertrophy without significant foraminal encroachment. C4-C5: Diffuse degenerative disc osteophyte with bilateral uncovertebral hypertrophy. Broad posterior component flattens and effaces the ventral  thecal sac, greater on the left. Superimposed mild facet and ligament flavum hypertrophy. Resultant moderate spinal stenosis with mild cord flattening. Left greater than right uncinate spurring with resultant moderate left worse than right C5 foraminal stenosis. C5-C6: Chronic intervertebral disc space narrowing with diffuse degenerative disc osteophyte. Broad posterior component flattens and partially faces the ventral thecal sac resultant mild spinal stenosis. No cord deformity. Severe right with mild left C6 foraminal stenosis. C6-C7: Mild disc bulge with uncovertebral hypertrophy. No significant spinal stenosis. Moderate right C7  foraminal stenosis. No significant left foraminal narrowing. C7-T1:  Unremarkable. Visualized upper thoracic spine demonstrates no significant finding. IMPRESSION: MRI HEAD IMPRESSION: 1. 8 mm acute ischemic left basal ganglia infarct. Associated petechial hemorrhage without frank hemorrhagic transformation. 2. Additional small volume subacute ischemic changes involving the high right frontal centrum semi ovale and left cerebellum as above. 3. Underlying severe chronic microvascular ischemic disease with multiple remote lacunar infarcts involving the deep gray nuclei and pons. 4. Multiple scattered chronic micro hemorrhages involving both cerebral and cerebellar hemispheres, most pronounced about the deep gray nuclei. Changes related to chronic poorly controlled hypertension is favored. Cerebral amyloid would be the primary differential consideration. MRI CERVICAL SPINE IMPRESSION: 1. Degenerative disc osteophyte at C4-5 with resultant moderate spinal stenosis, with moderate left worse than right C5 foraminal narrowing. Finding could contribute to left-sided radicular symptoms. 2. Disc bulge with uncovertebral spurring at C5-6 and C6-7 with resultant moderate to severe right C6 and C7 foraminal stenosis. 3. Broad-based central disc protrusion at C3-4 with resultant mild spinal stenosis.  Electronically Signed   By: Rise Mu M.D.   On: 09/20/2019 22:41   MR Cervical Spine Wo Contrast  Result Date: 09/20/2019 CLINICAL DATA:  Initial evaluation for acute left upper extremity weakness. EXAM: MRI HEAD WITHOUT CONTRAST MRI CERVICAL SPINE WITHOUT CONTRAST TECHNIQUE: Multiplanar, multiecho pulse sequences of the brain and surrounding structures, and cervical spine, to include the craniocervical junction and cervicothoracic junction, were obtained without intravenous contrast. COMPARISON:  Comparison made with prior CT from earlier the same day. FINDINGS: MRI HEAD FINDINGS Brain: Generalized age-related cerebral atrophy. Extensive chronic microvascular ischemic disease with multiple remote lacunar infarcts seen clustered about the bilateral basal ganglia and thalami. Patchy involvement of the pons noted as well. 8 mm focus of restricted diffusion involving the left caudate consistent with an acute ischemic infarct (series 5, image 8). Associated susceptibility artifact suggests petechial hemorrhage without frank hemorrhagic transformation. No additional punctate 4 mm cortical infarct noted at the left occipital lobe (a series 5, image 76). No associated hemorrhage or mass effect. Additional faint subcentimeter diffusion abnormality at the high right frontal centrum semi ovale consistent with subacute small vessel ischemia. Mild residual diffusion abnormality seen about a largely chronic appearing lacunar infarct at the posterior right basal ganglia. Additional 12 mm vague focus of diffusion abnormality involving the left cerebellum consistent with a subacute left cerebellar infarct. No associated hemorrhage or mass effect. Multiple scattered chronic micro hemorrhages seen involving both cerebral and cerebellar hemispheres, most notable about the deep gray nuclei, favored to be related to chronic poorly controlled hypertension. Sequelae of cerebral amyloid would be the primary differential  consideration. No mass lesion, midline shift or mass effect. No hydrocephalus. No extra-axial fluid collection. Pituitary gland suprasellar region normal. Midline structures intact. Vascular: Major intracranial vascular flow voids are maintained. Skull and upper cervical spine: Craniocervical junction within normal limits. Bone marrow signal intensity normal. No scalp soft tissue abnormality. Sinuses/Orbits: Globes and orbital soft tissues within normal limits. Paranasal sinuses are largely clear. No significant mastoid effusion. Other: None. MRI CERVICAL SPINE FINDINGS Alignment: Vertebral bodies normally aligned with preservation of the normal cervical lordosis. No listhesis. Vertebrae: Vertebral body height maintained without evidence for acute or chronic fracture. Bone marrow signal intensity somewhat heterogeneous but within normal limits. No discrete or worrisome osseous lesions. No abnormal marrow edema. Cord: Signal intensity within the cervical spinal cord is within normal limits. Posterior Fossa, vertebral arteries, paraspinal tissues: Craniocervical junction within normal limits. Paraspinous and prevertebral  soft tissues normal. Normal flow voids seen within the vertebral arteries bilaterally. Disc levels: C2-C3: Unremarkable. C3-C4: Broad central disc protrusion indents the ventral thecal sac, partially effacing the ventral CSF. Mild spinal stenosis without cord deformity. Superimposed uncovertebral hypertrophy without significant foraminal encroachment. C4-C5: Diffuse degenerative disc osteophyte with bilateral uncovertebral hypertrophy. Broad posterior component flattens and effaces the ventral thecal sac, greater on the left. Superimposed mild facet and ligament flavum hypertrophy. Resultant moderate spinal stenosis with mild cord flattening. Left greater than right uncinate spurring with resultant moderate left worse than right C5 foraminal stenosis. C5-C6: Chronic intervertebral disc space narrowing  with diffuse degenerative disc osteophyte. Broad posterior component flattens and partially faces the ventral thecal sac resultant mild spinal stenosis. No cord deformity. Severe right with mild left C6 foraminal stenosis. C6-C7: Mild disc bulge with uncovertebral hypertrophy. No significant spinal stenosis. Moderate right C7 foraminal stenosis. No significant left foraminal narrowing. C7-T1:  Unremarkable. Visualized upper thoracic spine demonstrates no significant finding. IMPRESSION: MRI HEAD IMPRESSION: 1. 8 mm acute ischemic left basal ganglia infarct. Associated petechial hemorrhage without frank hemorrhagic transformation. 2. Additional small volume subacute ischemic changes involving the high right frontal centrum semi ovale and left cerebellum as above. 3. Underlying severe chronic microvascular ischemic disease with multiple remote lacunar infarcts involving the deep gray nuclei and pons. 4. Multiple scattered chronic micro hemorrhages involving both cerebral and cerebellar hemispheres, most pronounced about the deep gray nuclei. Changes related to chronic poorly controlled hypertension is favored. Cerebral amyloid would be the primary differential consideration. MRI CERVICAL SPINE IMPRESSION: 1. Degenerative disc osteophyte at C4-5 with resultant moderate spinal stenosis, with moderate left worse than right C5 foraminal narrowing. Finding could contribute to left-sided radicular symptoms. 2. Disc bulge with uncovertebral spurring at C5-6 and C6-7 with resultant moderate to severe right C6 and C7 foraminal stenosis. 3. Broad-based central disc protrusion at C3-4 with resultant mild spinal stenosis. Electronically Signed   By: Rise Mu M.D.   On: 09/20/2019 22:41    Procedures Procedures (including critical care time)  Medications Ordered in ED Medications  fentaNYL (SUBLIMAZE) injection 50 mcg (50 mcg Intravenous Given 09/20/19 2247)  ondansetron (ZOFRAN) injection 4 mg (4 mg  Intravenous Given 09/20/19 2246)    ED Course  I have reviewed the triage vital signs and the nursing notes.  Pertinent labs & imaging results that were available during my care of the patient were reviewed by me and considered in my medical decision making (see chart for details).    MDM Rules/Calculators/A&P                      74 y.o. F past no history of stroke in December 2020 with left-sided hemiparesis who presents via EMS for evaluation of worsening left upper extremity weakness, numbness.  Unclear when symptoms started.  Husband noticed that this morning when she woke up.  On initial ED arrival, she is afebrile, nontoxic-appearing.  Vital signs are stable.  She has good radial pulses.  On exam, she has obvious weakness of her left upper extremity with pronator drift, unequal grip strength.  She also has a wrist drop noted on the left side.  Concern for acute CVA.  History/physical exam not concerning for ischemic limb, septic arthritis.  BMP is unremarkable.  UA shows trace leukocytes, pyuria.  INR is 1.2.  CBC shows leukocytosis of 10.6.  Unclear last known normal.  At this time, not a candidate for TPA.  No indication for code  stroke.  Discussed patient with Dr. Otelia Limes (Neuro).  Recommends obtaining MRI brain and C-spine.  MRI brain shows acute 8 mm left basal ganglia infarct. Additional small volume subacute ischemic changes. Multiple scattered chronic microvascular ischemic disease with multiple remote lacunar infarcts. MRI C spine shows degenerative disc osteophyte at C4-C5 with moderate spinal stenosis and right C5 foraminal narrowing.  She has disc bulge with spurring at C5-C6 and C6-C7.  Discussed patient with Dr. Otelia Limes (Neuro). He will plan to consult. Recommends medical admission.   Patient signed out to Claude Manges, PA-C pending admission.   Portions of this note were generated with Scientist, clinical (histocompatibility and immunogenetics). Dictation errors may occur despite best attempts at  proofreading.  Final Clinical Impression(s) / ED Diagnoses Final diagnoses:  Acute CVA (cerebrovascular accident) [facility])    Rx / DC Orders ED Discharge Orders    None       Rosana Hoes 09/20/19 2340    Linwood Dibbles, MD 09/21/19 [phone]

## 2019-09-20 NOTE — Consult Note (Signed)
Referring Physician: Dr. Bebe Shaggy    Chief Complaint: New onset of left hand weakness and numbness in conjunction with LLE weakness.   HPI: Patricia Stanley is a 74 y.o. female with a PMHx of CVA in December 2020 with residual left-sided deficits, HLD, HTN, carotid artery stenosis status post right CEA 04/2019, failure to thrive and anemia, recently discharged from [facility] last Friday after admission for persistent diarrhea, worsening weakness, anemia and severe deconditioning, who presents today via EMS with worsening of her left sided weakness. She woke up at 0900 with a feeling that her left hand was asleep, being both weak and numb. She was unable to move her wrist or grab objects. She thought initially that the symptoms were due to sleeping on it wrong, but the symptoms did not resolve, prompting a call to EMS. She also noted some LLE weakness, but it was not as severe as her left hand weakness. It is somewhat unclear whether the left sided worsening in acute or has been gradually progressive since her stroke in December.   Per EDP note: "...brought in by EMS for evaluation of worsening left upper extremity weakness, numbness and worsening left lower extremity weakness.  Unclear of how long this is been going on.  Husband reports that since her stroke in December, she has had some left-sided hemiparesis but is still been able to assist with getting up and standing while she is in PT.  This morning at 9 AM, she was having more difficulty than normal.  When PT got there, she was unable to assist in getting up and was complaining of worsening weakness and numbness in her left upper extremity.  Patient states that she feels like she has been getting weaker over the last few days though she cannot pinpoint when this started.  He feels like the numbness started today.  Patient denies any falls.  She thinks she is on blood thinners but is unsure.  She denies any vision changes, chest pain, difficulty breathing,  abdominal pain, nausea/vomiting, difficulty speaking."  An MRI brain was obtained in the ED, revealing an 8 mm acute ischemic left basal ganglia infarct; there was associated petechial hemorrhage without frank hemorrhagic transformation. Most significantly regarding her new symptoms were small volume subacute ischemic changes involving the high right frontal centrum semi ovale and left cerebellum. Also noted were underlying severe chronic microvascular ischemic disease with multiple remote lacunar infarcts involving the deep gray nuclei and pons. There were multiple scattered chronic micro hemorrhages involving both cerebral and cerebellar hemispheres, most pronounced about the deep gray nuclei. The DDx for the latter findings includes changes related to chronic poorly controlled hypertension versus cerebral amyloid angiopathy.   Past Medical History:  Diagnosis Date  . Closed fracture of coccyx (HCC) 2015   AFTER FALL  . Depression   . GERD (gastroesophageal reflux disease)   . Headache    MIGRAINES OCCASIONAL  . History of kidney stones    HAS NOW  . Hypertension     Past Surgical History:  Procedure Laterality Date  . ABDOMINAL HYSTERECTOMY     1 OVARY LEFT  . APPENDECTOMY  AGE 30   DONE WITH OVARY REMOVAL  . COLONOSCOPY WITH PROPOFOL N/A 12/03/2015   Procedure: COLONOSCOPY WITH PROPOFOL;  Surgeon: Charolett Bumpers, MD;  Location: [facility];  Service: Endoscopy;  Laterality: N/A;  . ENDARTERECTOMY Right 04/21/2019   Procedure: ENDARTERECTOMY CAROTID;  Surgeon: Cephus Shelling, MD;  Location: [facility] OR;  Service: Vascular;  Laterality:  Right;  . ESOPHAGOGASTRODUODENOSCOPY (EGD) WITH PROPOFOL N/A 12/03/2015   Procedure: ESOPHAGOGASTRODUODENOSCOPY (EGD) WITH PROPOFOL;  Surgeon: Garlan Fair, MD;  Location: [facility]OSCOPY;  Service: Endoscopy;  Laterality: N/A;  . TONSILLECTOMY  AGE 34   AND ADENOIDS REMOVED    Family History  Problem Relation Age of Onset  . Hypertension Mother    . Hyperlipidemia Mother   . Hypertension Father   . Hyperlipidemia Father    Social History:  reports that she quit smoking about 24 years ago. She has never used smokeless tobacco. She reports that she does not drink alcohol or use drugs.  Allergies:  Allergies  Allergen Reactions  . Antihistamines, Chlorpheniramine-Type Other (See Comments)    Makes her heart race  . Paroxetine Other (See Comments)    Shakes-tremors    Home Medications:  No current facility-administered medications on file prior to encounter.   Current Outpatient Medications on File Prior to Encounter  Medication Sig Dispense Refill  . aspirin 325 MG tablet Take 1 tablet (325 mg total) by mouth daily. 30 tablet 0  . atenolol (TENORMIN) 50 MG tablet Take 1 tablet (50 mg total) by mouth every evening. 30 tablet 0  . atorvastatin (LIPITOR) 80 MG tablet Take 1 tablet (80 mg total) by mouth daily at 6 PM. 30 tablet 0  . cholestyramine (QUESTRAN) 4 g packet Take 1 packet (4 g total) by mouth 2 (two) times daily. 180 packet 0  . clonazePAM (KLONOPIN) 1 MG tablet 1 tab every a.m. and 2 tabs nightly (Patient taking differently: Take 1 mg by mouth in the morning and at bedtime. ) 60 tablet 0  . ferrous sulfate 325 (65 FE) MG tablet Take 1 tablet (325 mg total) by mouth 2 (two) times daily with a meal. 180 tablet 0  . lipase/protease/amylase (CREON) 36000 UNITS CPEP capsule Take 1 capsule (36,000 Units total) by mouth 3 (three) times daily with meals. 270 capsule 0  . lisinopril (ZESTRIL) 10 MG tablet Take 1 tablet (10 mg total) by mouth daily. 30 tablet 0  . loperamide (IMODIUM) 2 MG capsule Take 1 capsule (2 mg total) by mouth as needed for diarrhea or loose stools. 30 capsule 0  . mirtazapine (REMERON) 15 MG tablet Take 1 tablet (15 mg total) by mouth at bedtime. 90 tablet 0  . ondansetron (ZOFRAN) 4 MG tablet Take 1 tablet (4 mg total) by mouth 3 (three) times daily before meals. 90 tablet 2  . pantoprazole (PROTONIX) 40  MG tablet Take 1 tablet (40 mg total) by mouth 2 (two) times daily before a meal. 60 tablet 0     ROS: Denies headache. Has multifocal limb aching and some chest aching. The pain is migratory. No cough. No SOB, abdominal pain, or N/V. No further diarrhea. No aphasia. No vision changes. Does not endorse facial droop or new dysarthria. Other ROS as per HPI.   Physical Examination: Blood pressure (!) 170/73, pulse 69, temperature 97.9 F (36.6 C), temperature source Oral, resp. rate 16, SpO2 94 %.  General: Pale and cachectic appearing.  HEENT: [place]/AT Lungs: Respirations unlabored Ext: No edema  Neurologic Examination: Ment: Awake and alert, but with mildly decreased attention and somewhat slowed verbal responses to several questions. Oriented x 5. Able to describe her symptoms but sometimes needs prompting from her daughter. Speech is mildly dysarthric but at baseline per daughter. In this context her speech is fluent without errors of grammar or syntax. Able to follow all commands. Naming intact. Had  slight difficulty repeating a phrase.  CN: PERRL. No field cut with testing of each eye individually; however, there is extinction to DSS on the right. EOMI. No nystagmus. No ptosis. Facial sensation intact to temp. No facial droop. Mildly HOH. No hypophonia. Shoulder shrug slightly delayed on the left relative to the right. Tongue midline.  Motor:  Decreased muscle bulk x 4 proximally and distally, without asymmetry. RUE and RLE 4+/5 proximally and distally.  LUE: 4/5 deltoid, biceps and triceps. 0/5 wrist extension and flexion. 0/5 grip, finger abduction and finger extension.  Sensory:  LUE: Dysesthesia to fine touch involving the entirety of her left hand and a portion of distal forearm. Mildly decreased temp sensation to left hand.  RUE: Normal FT and temp sensation.  LLE: Decreased FT sensation to left foot and distal leg below knee. Temp sensation slightly decreased to LLE as well.  RLE:  Normal FT and temp sensation.  Reflexes:  3+ right brachioradialis and biceps. 2+ left brachioradialis and biceps.  4+ patellars bilaterally (crossed adductors). 0 achilles bilaterally.  Cerebellar: No ataxia with FNF on the right. There is some ataxia disproportionate to weakness with left FNF.  Gait: Deferred.   Results for orders placed or performed during the hospital encounter of 09/20/19 (from the past 48 hour(s))  CBG monitoring, ED     Status: None   Collection Time: 09/20/19  7:36 PM  Result Value Ref Range   Glucose-Capillary 93 70 - 99 mg/dL    Comment: Glucose reference range applies only to samples taken after fasting for at least 8 hours.  Basic metabolic panel     Status: Abnormal   Collection Time: 09/20/19  8:13 PM  Result Value Ref Range   Sodium 141 135 - 145 mmol/L   Potassium 4.7 3.5 - 5.1 mmol/L   Chloride 113 (H) 98 - 111 mmol/L   CO2 22 22 - 32 mmol/L   Glucose, Bld 95 70 - 99 mg/dL    Comment: Glucose reference range applies only to samples taken after fasting for at least 8 hours.   BUN 22 8 - 23 mg/dL   Creatinine, Ser 0.27 0.44 - 1.00 mg/dL   Calcium 8.8 (L) 8.9 - 10.3 mg/dL   GFR calc non Af Amer 56 (L) >60 mL/min   GFR calc Af Amer >60 >60 mL/min   Anion gap 6 5 - 15    Comment: Performed at [facility], [address] [place], [place] [address]  CBC with Differential     Status: Abnormal   Collection Time: 09/20/19  8:13 PM  Result Value Ref Range   WBC 10.6 (H) 4.0 - 10.5 K/uL   RBC 2.53 (L) 3.87 - 5.11 MIL/uL   Hemoglobin 7.1 (L) 12.0 - 15.0 g/dL   HCT 78.6 (L) 76.7 - 20.9 %   MCV 92.9 80.0 - 100.0 fL   MCH 28.1 26.0 - 34.0 pg   MCHC 30.2 30.0 - 36.0 g/dL   RDW 47.0 (H) 96.2 - 83.6 %   Platelets 313 150 - 400 K/uL   nRBC 0.8 (H) 0.0 - 0.2 %   Neutrophils Relative % 77 %   Neutro Abs 8.2 (H) 1.7 - 7.7 K/uL   Lymphocytes Relative 10 %   Lymphs Abs 1.1 0.7 - 4.0 K/uL   Monocytes Relative 4 %   Monocytes Absolute 0.5 0.1 - 1.0 K/uL    Eosinophils Relative 7 %   Eosinophils Absolute 0.8 (H) 0.0 - 0.5 K/uL  Basophils Relative 1 %   Basophils Absolute 0.1 0.0 - 0.1 K/uL   Immature Granulocytes 1 %   Abs Immature Granulocytes 0.08 (H) 0.00 - 0.07 K/uL    Comment: Performed at [facility], [address] [place], [place] [address]  Protime-INR     Status: None   Collection Time: 09/20/19  8:13 PM  Result Value Ref Range   Prothrombin Time 14.5 11.4 - 15.2 seconds   INR 1.2 0.8 - 1.2    Comment: (NOTE) INR goal varies based on device and disease states. Performed at [facility], [address], [place], [place] [address]   Urinalysis, Routine w reflex microscopic     Status: Abnormal   Collection Time: 09/20/19  8:16 PM  Result Value Ref Range   Color, Urine YELLOW YELLOW   APPearance HAZY (A) CLEAR   Specific Gravity, Urine 1.018 1.005 - 1.030   pH 5.0 5.0 - 8.0   Glucose, UA NEGATIVE NEGATIVE mg/dL   Hgb urine dipstick MODERATE (A) NEGATIVE   Bilirubin Urine NEGATIVE NEGATIVE   Ketones, ur NEGATIVE NEGATIVE mg/dL   Protein, ur 644 (A) NEGATIVE mg/dL   Nitrite NEGATIVE NEGATIVE   Leukocytes,Ua TRACE (A) NEGATIVE   RBC / HPF 11-20 0 - 5 RBC/hpf   WBC, UA 21-50 0 - 5 WBC/hpf   Bacteria, UA RARE (A) NONE SEEN   Squamous Epithelial / LPF 0-5 0 - 5   Hyaline Casts, UA PRESENT     Comment: Performed at [facility], [address] [place], [place] [address]   CT Head Wo Contrast  Result Date: 09/20/2019 CLINICAL DATA:  Left hand paralysis and numbness EXAM: CT HEAD WITHOUT CONTRAST TECHNIQUE: Contiguous axial images were obtained from the base of the skull through the vertex without intravenous contrast. COMPARISON:  MRI 04/18/2019, CT brain 04/18/2019 FINDINGS: Brain: No acute territorial infarction, hemorrhage or intracranial mass. Atrophy and chronic small vessel ischemic change of the white matter. Multiple bilateral chronic infarcts involving the white matter, basal ganglia and thalamus.  Small focal hypodensity within the left cerebellum, not clearly seen on the previous exam and potentially representing age indeterminate lacunar infarct. Stable ventricle size. Vascular: No hyperdense vessels.  Carotid vascular calcification Skull: Normal. Negative for fracture or focal lesion. Sinuses/Orbits: No acute finding. Other: None IMPRESSION: 1. Negative for intracranial hemorrhage or territorial infarction. 2. Small focal hypodensity in the left cerebellum, may reflect age indeterminate lacunar infarct. 3. Atrophy, chronic small vessel ischemic changes of the white matter and multiple bilateral chronic appearing lacunar infarcts. Electronically Signed   By: Jasmine Pang M.D.   On: 09/20/2019 20:09   MR BRAIN WO CONTRAST  Result Date: 09/20/2019 CLINICAL DATA:  Initial evaluation for acute left upper extremity weakness. EXAM: MRI HEAD WITHOUT CONTRAST MRI CERVICAL SPINE WITHOUT CONTRAST TECHNIQUE: Multiplanar, multiecho pulse sequences of the brain and surrounding structures, and cervical spine, to include the craniocervical junction and cervicothoracic junction, were obtained without intravenous contrast. COMPARISON:  Comparison made with prior CT from earlier the same day. FINDINGS: MRI HEAD FINDINGS Brain: Generalized age-related cerebral atrophy. Extensive chronic microvascular ischemic disease with multiple remote lacunar infarcts seen clustered about the bilateral basal ganglia and thalami. Patchy involvement of the pons noted as well. 8 mm focus of restricted diffusion involving the left caudate consistent with an acute ischemic infarct (series 5, image 8). Associated susceptibility artifact suggests petechial hemorrhage without frank hemorrhagic transformation. No additional punctate 4 mm cortical infarct noted  at the left occipital lobe (a series 5, image 76). No associated hemorrhage or mass effect. Additional faint subcentimeter diffusion abnormality at the high right frontal centrum semi  ovale consistent with subacute small vessel ischemia. Mild residual diffusion abnormality seen about a largely chronic appearing lacunar infarct at the posterior right basal ganglia. Additional 12 mm vague focus of diffusion abnormality involving the left cerebellum consistent with a subacute left cerebellar infarct. No associated hemorrhage or mass effect. Multiple scattered chronic micro hemorrhages seen involving both cerebral and cerebellar hemispheres, most notable about the deep gray nuclei, favored to be related to chronic poorly controlled hypertension. Sequelae of cerebral amyloid would be the primary differential consideration. No mass lesion, midline shift or mass effect. No hydrocephalus. No extra-axial fluid collection. Pituitary gland suprasellar region normal. Midline structures intact. Vascular: Major intracranial vascular flow voids are maintained. Skull and upper cervical spine: Craniocervical junction within normal limits. Bone marrow signal intensity normal. No scalp soft tissue abnormality. Sinuses/Orbits: Globes and orbital soft tissues within normal limits. Paranasal sinuses are largely clear. No significant mastoid effusion. Other: None. MRI CERVICAL SPINE FINDINGS Alignment: Vertebral bodies normally aligned with preservation of the normal cervical lordosis. No listhesis. Vertebrae: Vertebral body height maintained without evidence for acute or chronic fracture. Bone marrow signal intensity somewhat heterogeneous but within normal limits. No discrete or worrisome osseous lesions. No abnormal marrow edema. Cord: Signal intensity within the cervical spinal cord is within normal limits. Posterior Fossa, vertebral arteries, paraspinal tissues: Craniocervical junction within normal limits. Paraspinous and prevertebral soft tissues normal. Normal flow voids seen within the vertebral arteries bilaterally. Disc levels: C2-C3: Unremarkable. C3-C4: Broad central disc protrusion indents the ventral  thecal sac, partially effacing the ventral CSF. Mild spinal stenosis without cord deformity. Superimposed uncovertebral hypertrophy without significant foraminal encroachment. C4-C5: Diffuse degenerative disc osteophyte with bilateral uncovertebral hypertrophy. Broad posterior component flattens and effaces the ventral thecal sac, greater on the left. Superimposed mild facet and ligament flavum hypertrophy. Resultant moderate spinal stenosis with mild cord flattening. Left greater than right uncinate spurring with resultant moderate left worse than right C5 foraminal stenosis. C5-C6: Chronic intervertebral disc space narrowing with diffuse degenerative disc osteophyte. Broad posterior component flattens and partially faces the ventral thecal sac resultant mild spinal stenosis. No cord deformity. Severe right with mild left C6 foraminal stenosis. C6-C7: Mild disc bulge with uncovertebral hypertrophy. No significant spinal stenosis. Moderate right C7 foraminal stenosis. No significant left foraminal narrowing. C7-T1:  Unremarkable. Visualized upper thoracic spine demonstrates no significant finding. IMPRESSION: MRI HEAD IMPRESSION: 1. 8 mm acute ischemic left basal ganglia infarct. Associated petechial hemorrhage without frank hemorrhagic transformation. 2. Additional small volume subacute ischemic changes involving the high right frontal centrum semi ovale and left cerebellum as above. 3. Underlying severe chronic microvascular ischemic disease with multiple remote lacunar infarcts involving the deep gray nuclei and pons. 4. Multiple scattered chronic micro hemorrhages involving both cerebral and cerebellar hemispheres, most pronounced about the deep gray nuclei. Changes related to chronic poorly controlled hypertension is favored. Cerebral amyloid would be the primary differential consideration. MRI CERVICAL SPINE IMPRESSION: 1. Degenerative disc osteophyte at C4-5 with resultant moderate spinal stenosis, with  moderate left worse than right C5 foraminal narrowing. Finding could contribute to left-sided radicular symptoms. 2. Disc bulge with uncovertebral spurring at C5-6 and C6-7 with resultant moderate to severe right C6 and C7 foraminal stenosis. 3. Broad-based central disc protrusion at C3-4 with resultant mild spinal stenosis. Electronically Signed   By: Janell QuietBenjamin  McClintock M.D.  On: 09/20/2019 22:41   MR Cervical Spine Wo Contrast  Result Date: 09/20/2019 CLINICAL DATA:  Initial evaluation for acute left upper extremity weakness. EXAM: MRI HEAD WITHOUT CONTRAST MRI CERVICAL SPINE WITHOUT CONTRAST TECHNIQUE: Multiplanar, multiecho pulse sequences of the brain and surrounding structures, and cervical spine, to include the craniocervical junction and cervicothoracic junction, were obtained without intravenous contrast. COMPARISON:  Comparison made with prior CT from earlier the same day. FINDINGS: MRI HEAD FINDINGS Brain: Generalized age-related cerebral atrophy. Extensive chronic microvascular ischemic disease with multiple remote lacunar infarcts seen clustered about the bilateral basal ganglia and thalami. Patchy involvement of the pons noted as well. 8 mm focus of restricted diffusion involving the left caudate consistent with an acute ischemic infarct (series 5, image 8). Associated susceptibility artifact suggests petechial hemorrhage without frank hemorrhagic transformation. No additional punctate 4 mm cortical infarct noted at the left occipital lobe (a series 5, image 76). No associated hemorrhage or mass effect. Additional faint subcentimeter diffusion abnormality at the high right frontal centrum semi ovale consistent with subacute small vessel ischemia. Mild residual diffusion abnormality seen about a largely chronic appearing lacunar infarct at the posterior right basal ganglia. Additional 12 mm vague focus of diffusion abnormality involving the left cerebellum consistent with a subacute left  cerebellar infarct. No associated hemorrhage or mass effect. Multiple scattered chronic micro hemorrhages seen involving both cerebral and cerebellar hemispheres, most notable about the deep gray nuclei, favored to be related to chronic poorly controlled hypertension. Sequelae of cerebral amyloid would be the primary differential consideration. No mass lesion, midline shift or mass effect. No hydrocephalus. No extra-axial fluid collection. Pituitary gland suprasellar region normal. Midline structures intact. Vascular: Major intracranial vascular flow voids are maintained. Skull and upper cervical spine: Craniocervical junction within normal limits. Bone marrow signal intensity normal. No scalp soft tissue abnormality. Sinuses/Orbits: Globes and orbital soft tissues within normal limits. Paranasal sinuses are largely clear. No significant mastoid effusion. Other: None. MRI CERVICAL SPINE FINDINGS Alignment: Vertebral bodies normally aligned with preservation of the normal cervical lordosis. No listhesis. Vertebrae: Vertebral body height maintained without evidence for acute or chronic fracture. Bone marrow signal intensity somewhat heterogeneous but within normal limits. No discrete or worrisome osseous lesions. No abnormal marrow edema. Cord: Signal intensity within the cervical spinal cord is within normal limits. Posterior Fossa, vertebral arteries, paraspinal tissues: Craniocervical junction within normal limits. Paraspinous and prevertebral soft tissues normal. Normal flow voids seen within the vertebral arteries bilaterally. Disc levels: C2-C3: Unremarkable. C3-C4: Broad central disc protrusion indents the ventral thecal sac, partially effacing the ventral CSF. Mild spinal stenosis without cord deformity. Superimposed uncovertebral hypertrophy without significant foraminal encroachment. C4-C5: Diffuse degenerative disc osteophyte with bilateral uncovertebral hypertrophy. Broad posterior component flattens and  effaces the ventral thecal sac, greater on the left. Superimposed mild facet and ligament flavum hypertrophy. Resultant moderate spinal stenosis with mild cord flattening. Left greater than right uncinate spurring with resultant moderate left worse than right C5 foraminal stenosis. C5-C6: Chronic intervertebral disc space narrowing with diffuse degenerative disc osteophyte. Broad posterior component flattens and partially faces the ventral thecal sac resultant mild spinal stenosis. No cord deformity. Severe right with mild left C6 foraminal stenosis. C6-C7: Mild disc bulge with uncovertebral hypertrophy. No significant spinal stenosis. Moderate right C7 foraminal stenosis. No significant left foraminal narrowing. C7-T1:  Unremarkable. Visualized upper thoracic spine demonstrates no significant finding. IMPRESSION: MRI HEAD IMPRESSION: 1. 8 mm acute ischemic left basal ganglia infarct. Associated petechial hemorrhage without frank hemorrhagic transformation. 2.  Additional small volume subacute ischemic changes involving the high right frontal centrum semi ovale and left cerebellum as above. 3. Underlying severe chronic microvascular ischemic disease with multiple remote lacunar infarcts involving the deep gray nuclei and pons. 4. Multiple scattered chronic micro hemorrhages involving both cerebral and cerebellar hemispheres, most pronounced about the deep gray nuclei. Changes related to chronic poorly controlled hypertension is favored. Cerebral amyloid would be the primary differential consideration. MRI CERVICAL SPINE IMPRESSION: 1. Degenerative disc osteophyte at C4-5 with resultant moderate spinal stenosis, with moderate left worse than right C5 foraminal narrowing. Finding could contribute to left-sided radicular symptoms. 2. Disc bulge with uncovertebral spurring at C5-6 and C6-7 with resultant moderate to severe right C6 and C7 foraminal stenosis. 3. Broad-based central disc protrusion at C3-4 with resultant  mild spinal stenosis. Electronically Signed   By: Rise Mu M.D.   On: 09/20/2019 22:41    Assessment: 74 y.o. female with residual left sided weakness from stroke in December, re-presenting with acutely worsened left sided weakness that may also have a progressive component in the context of deconditioning and FTT since her recent stroke.  1. Exam reveals left wrist and hand 0/5 strength as well as mild proximal LUE weakness, mild LLE proximal weakness and moderate to severe left foot/ankle weakness.  2. MRI brain: There is an 8 mm acute ischemic left basal ganglia infarct; there is associated petechial hemorrhage without frank hemorrhagic transformation - this does not account for her presentation. Most significantly regarding her new symptoms are small volume subacute ischemic changes involving the high right frontal centrum semi ovale and left cerebellum. Also noted are underlying severe chronic microvascular ischemic disease with multiple remote lacunar infarcts involving the deep gray nuclei and pons. There are multiple scattered chronic micro hemorrhages involving both cerebral and cerebellar hemispheres, most pronounced about the deep gray nuclei. The DDx for the latter findings includes changes related to chronic poorly controlled hypertension versus cerebral amyloid angiopathy.  3. MRI cervical spine: Degenerative changes with moderate left worse than right C5 foraminal narrowings which could contribute to left-sided radicular symptoms. Additional degenerative changes not referable to the patient's presentation were noted.  4. Stroke Risk Factors - Prior CVA, HLD, HTN and carotid artery stenosis status post right CEA 04/2019. 5. CTA of head and neck from 04/19/19: Extensive mixed plaque about the origin of the right ICA with associated stenosis of up to 65-70% by NASCET criteria (note, this scan was obtained prior to her right CEA in December). Multifocal moderate 50% stenoses seen  involving the origin and proximal left common carotid artery. Extensive atheromatous disease throughout the intracranial circulation with multifocal moderate to severe stenoses. Notable findings include a severe stenosis at the supraclinoid left ICA, moderate proximal right M1 and mid left M1 stenoses, with moderate to severe multifocal left V4 stenoses. Both PCAs are heavily diseased with extensive atheromatous irregularity. Tiny right PCA aneurysm. 6. Echocardiogram 04/19/19: No mural thrombus mentioned in the report. LVEF was 60 to 65% with normal LV systolic function. Left ventricular diastolic parameters were consistent with Grade II  diastolic dysfunction   Plan: 1. Continue ASA at current dose for now. Stroke team to determine whether benefits of addition of Plavix outweigh risks, given her anemia versus the severe intracranial atherosclerotic disease seen on CTA in December.  2. Continue atorvastatin.  3. Cardiac telemetry 4. PT consult, OT consult, Speech consult 5. Frequent neuro checks 6. BP management. Time of symptom onset not entirely clear, but most likely has  been greater than 24 hours.  7. IVF    signed: Dr. Caryl Pina 09/20/2019, 11:02 PM

## 2019-09-21 ENCOUNTER — Inpatient Hospital Stay (HOSPITAL_COMMUNITY): Payer: Medicare HMO

## 2019-09-21 ENCOUNTER — Emergency Department (HOSPITAL_COMMUNITY): Payer: Medicare HMO

## 2019-09-21 ENCOUNTER — Encounter (HOSPITAL_COMMUNITY): Payer: Self-pay | Admitting: Internal Medicine

## 2019-09-21 DIAGNOSIS — R54 Age-related physical debility: Secondary | ICD-10-CM | POA: Diagnosis present

## 2019-09-21 DIAGNOSIS — R7989 Other specified abnormal findings of blood chemistry: Secondary | ICD-10-CM | POA: Diagnosis present

## 2019-09-21 DIAGNOSIS — J479 Bronchiectasis, uncomplicated: Secondary | ICD-10-CM | POA: Diagnosis not present

## 2019-09-21 DIAGNOSIS — I639 Cerebral infarction, unspecified: Secondary | ICD-10-CM | POA: Diagnosis present

## 2019-09-21 DIAGNOSIS — Z87442 Personal history of urinary calculi: Secondary | ICD-10-CM | POA: Diagnosis not present

## 2019-09-21 DIAGNOSIS — Z681 Body mass index (BMI) 19 or less, adult: Secondary | ICD-10-CM | POA: Diagnosis not present

## 2019-09-21 DIAGNOSIS — K5903 Drug induced constipation: Secondary | ICD-10-CM | POA: Diagnosis not present

## 2019-09-21 DIAGNOSIS — N179 Acute kidney failure, unspecified: Secondary | ICD-10-CM | POA: Diagnosis present

## 2019-09-21 DIAGNOSIS — R64 Cachexia: Secondary | ICD-10-CM | POA: Diagnosis present

## 2019-09-21 DIAGNOSIS — E46 Unspecified protein-calorie malnutrition: Secondary | ICD-10-CM | POA: Diagnosis not present

## 2019-09-21 DIAGNOSIS — E8809 Other disorders of plasma-protein metabolism, not elsewhere classified: Secondary | ICD-10-CM | POA: Diagnosis not present

## 2019-09-21 DIAGNOSIS — Z9981 Dependence on supplemental oxygen: Secondary | ICD-10-CM | POA: Diagnosis not present

## 2019-09-21 DIAGNOSIS — E860 Dehydration: Secondary | ICD-10-CM | POA: Diagnosis present

## 2019-09-21 DIAGNOSIS — J849 Interstitial pulmonary disease, unspecified: Secondary | ICD-10-CM | POA: Diagnosis not present

## 2019-09-21 DIAGNOSIS — K5900 Constipation, unspecified: Secondary | ICD-10-CM | POA: Diagnosis present

## 2019-09-21 DIAGNOSIS — R0902 Hypoxemia: Secondary | ICD-10-CM | POA: Diagnosis not present

## 2019-09-21 DIAGNOSIS — Z888 Allergy status to other drugs, medicaments and biological substances status: Secondary | ICD-10-CM | POA: Diagnosis not present

## 2019-09-21 DIAGNOSIS — M4802 Spinal stenosis, cervical region: Secondary | ICD-10-CM | POA: Diagnosis not present

## 2019-09-21 DIAGNOSIS — G9349 Other encephalopathy: Secondary | ICD-10-CM | POA: Diagnosis not present

## 2019-09-21 DIAGNOSIS — I69322 Dysarthria following cerebral infarction: Secondary | ICD-10-CM | POA: Diagnosis not present

## 2019-09-21 DIAGNOSIS — J8410 Pulmonary fibrosis, unspecified: Secondary | ICD-10-CM

## 2019-09-21 DIAGNOSIS — R52 Pain, unspecified: Secondary | ICD-10-CM | POA: Diagnosis present

## 2019-09-21 DIAGNOSIS — D638 Anemia in other chronic diseases classified elsewhere: Secondary | ICD-10-CM | POA: Diagnosis not present

## 2019-09-21 DIAGNOSIS — F324 Major depressive disorder, single episode, in partial remission: Secondary | ICD-10-CM | POA: Diagnosis present

## 2019-09-21 DIAGNOSIS — K219 Gastro-esophageal reflux disease without esophagitis: Secondary | ICD-10-CM | POA: Diagnosis present

## 2019-09-21 DIAGNOSIS — I10 Essential (primary) hypertension: Secondary | ICD-10-CM | POA: Diagnosis not present

## 2019-09-21 DIAGNOSIS — E785 Hyperlipidemia, unspecified: Secondary | ICD-10-CM | POA: Diagnosis present

## 2019-09-21 DIAGNOSIS — Z8673 Personal history of transient ischemic attack (TIA), and cerebral infarction without residual deficits: Secondary | ICD-10-CM | POA: Diagnosis not present

## 2019-09-21 DIAGNOSIS — J470 Bronchiectasis with acute lower respiratory infection: Secondary | ICD-10-CM | POA: Diagnosis present

## 2019-09-21 DIAGNOSIS — I272 Pulmonary hypertension, unspecified: Secondary | ICD-10-CM | POA: Diagnosis present

## 2019-09-21 DIAGNOSIS — I63019 Cerebral infarction due to thrombosis of unspecified vertebral artery: Secondary | ICD-10-CM | POA: Diagnosis not present

## 2019-09-21 DIAGNOSIS — E440 Moderate protein-calorie malnutrition: Secondary | ICD-10-CM | POA: Diagnosis present

## 2019-09-21 DIAGNOSIS — I2729 Other secondary pulmonary hypertension: Secondary | ICD-10-CM | POA: Diagnosis not present

## 2019-09-21 DIAGNOSIS — I6340 Cerebral infarction due to embolism of unspecified cerebral artery: Principal | ICD-10-CM

## 2019-09-21 DIAGNOSIS — J9601 Acute respiratory failure with hypoxia: Secondary | ICD-10-CM | POA: Diagnosis present

## 2019-09-21 DIAGNOSIS — R7401 Elevation of levels of liver transaminase levels: Secondary | ICD-10-CM | POA: Diagnosis not present

## 2019-09-21 DIAGNOSIS — D649 Anemia, unspecified: Secondary | ICD-10-CM | POA: Diagnosis present

## 2019-09-21 DIAGNOSIS — Z66 Do not resuscitate: Secondary | ICD-10-CM | POA: Diagnosis present

## 2019-09-21 DIAGNOSIS — N390 Urinary tract infection, site not specified: Secondary | ICD-10-CM | POA: Diagnosis not present

## 2019-09-21 DIAGNOSIS — I69354 Hemiplegia and hemiparesis following cerebral infarction affecting left non-dominant side: Secondary | ICD-10-CM | POA: Diagnosis not present

## 2019-09-21 DIAGNOSIS — R0989 Other specified symptoms and signs involving the circulatory and respiratory systems: Secondary | ICD-10-CM | POA: Diagnosis not present

## 2019-09-21 DIAGNOSIS — D72829 Elevated white blood cell count, unspecified: Secondary | ICD-10-CM | POA: Diagnosis not present

## 2019-09-21 DIAGNOSIS — R5381 Other malaise: Secondary | ICD-10-CM | POA: Diagnosis not present

## 2019-09-21 DIAGNOSIS — R627 Adult failure to thrive: Secondary | ICD-10-CM | POA: Diagnosis present

## 2019-09-21 DIAGNOSIS — Z20822 Contact with and (suspected) exposure to covid-19: Secondary | ICD-10-CM | POA: Diagnosis present

## 2019-09-21 DIAGNOSIS — R7303 Prediabetes: Secondary | ICD-10-CM | POA: Diagnosis not present

## 2019-09-21 DIAGNOSIS — I6302 Cerebral infarction due to thrombosis of basilar artery: Secondary | ICD-10-CM | POA: Diagnosis not present

## 2019-09-21 DIAGNOSIS — K529 Noninfective gastroenteritis and colitis, unspecified: Secondary | ICD-10-CM | POA: Diagnosis present

## 2019-09-21 DIAGNOSIS — I5032 Chronic diastolic (congestive) heart failure: Secondary | ICD-10-CM | POA: Diagnosis present

## 2019-09-21 DIAGNOSIS — I340 Nonrheumatic mitral (valve) insufficiency: Secondary | ICD-10-CM | POA: Diagnosis not present

## 2019-09-21 LAB — HEPATIC FUNCTION PANEL
ALT: 39 U/L (ref 0–44)
AST: 43 U/L — ABNORMAL HIGH (ref 15–41)
Albumin: 1.6 g/dL — ABNORMAL LOW (ref 3.5–5.0)
Alkaline Phosphatase: 94 U/L (ref 38–126)
Bilirubin, Direct: 0.3 mg/dL — ABNORMAL HIGH (ref 0.0–0.2)
Indirect Bilirubin: 0.6 mg/dL (ref 0.3–0.9)
Total Bilirubin: 0.9 mg/dL (ref 0.3–1.2)
Total Protein: 5.3 g/dL — ABNORMAL LOW (ref 6.5–8.1)

## 2019-09-21 LAB — TROPONIN I (HIGH SENSITIVITY)
Troponin I (High Sensitivity): 33 ng/L — ABNORMAL HIGH (ref <18)
Troponin I (High Sensitivity): 32 ng/L — ABNORMAL HIGH (ref <18)

## 2019-09-21 LAB — PROCALCITONIN: Procalcitonin: 0.17 ng/mL

## 2019-09-21 LAB — CBC
HCT: 28.5 % — ABNORMAL LOW (ref 36.0–46.0)
Hemoglobin: 8.8 g/dL — ABNORMAL LOW (ref 12.0–15.0)
MCH: 27.9 pg (ref 26.0–34.0)
MCHC: 30.9 g/dL (ref 30.0–36.0)
MCV: 90.5 fL (ref 80.0–100.0)
Platelets: 282 10*3/uL (ref 150–400)
RBC: 3.15 MIL/uL — ABNORMAL LOW (ref 3.87–5.11)
RDW: 17.6 % — ABNORMAL HIGH (ref 11.5–15.5)
WBC: 17 10*3/uL — ABNORMAL HIGH (ref 4.0–10.5)
nRBC: 0.6 % — ABNORMAL HIGH (ref 0.0–0.2)

## 2019-09-21 LAB — LIPID PANEL
Cholesterol: 59 mg/dL (ref 0–200)
HDL: 20 mg/dL — ABNORMAL LOW (ref >40)
LDL Cholesterol: 28 mg/dL (ref 0–99)
Total CHOL/HDL Ratio: 3 RATIO
Triglycerides: 53 mg/dL (ref <150)
VLDL: 11 mg/dL (ref 0–40)

## 2019-09-21 LAB — C-REACTIVE PROTEIN: CRP: 19.8 mg/dL — ABNORMAL HIGH (ref <1.0)

## 2019-09-21 LAB — SEDIMENTATION RATE: Sed Rate: 29 mm/hr — ABNORMAL HIGH (ref 0–22)

## 2019-09-21 LAB — CK: Total CK: 40 U/L (ref 38–234)

## 2019-09-21 LAB — FERRITIN: Ferritin: 1206 ng/mL — ABNORMAL HIGH (ref 11–307)

## 2019-09-21 LAB — SARS CORONAVIRUS 2 BY RT PCR (HOSPITAL ORDER, PERFORMED IN ~~LOC~~ HOSPITAL LAB): SARS Coronavirus 2: NEGATIVE

## 2019-09-21 LAB — LACTATE DEHYDROGENASE: LDH: 276 U/L — ABNORMAL HIGH (ref 98–192)

## 2019-09-21 LAB — PHOSPHORUS: Phosphorus: 2.7 mg/dL (ref 2.5–4.6)

## 2019-09-21 LAB — PREALBUMIN: Prealbumin: 6.9 mg/dL — ABNORMAL LOW (ref 18–38)

## 2019-09-21 LAB — MAGNESIUM: Magnesium: 1.2 mg/dL — ABNORMAL LOW (ref 1.7–2.4)

## 2019-09-21 LAB — D-DIMER, QUANTITATIVE: D-Dimer, Quant: 14.59 ug/mL-FEU — ABNORMAL HIGH (ref 0.00–0.50)

## 2019-09-21 LAB — PREPARE RBC (CROSSMATCH)

## 2019-09-21 MED ORDER — ASPIRIN 325 MG PO TABS
325.0000 mg | ORAL_TABLET | Freq: Every day | ORAL | Status: DC
  Administered 2019-09-21 – 2019-09-27 (×7): 325 mg via ORAL
  Filled 2019-09-21 (×7): qty 1

## 2019-09-21 MED ORDER — SODIUM CHLORIDE 0.9 % IV SOLN: INTRAVENOUS | Status: DC

## 2019-09-21 MED ORDER — ACETAMINOPHEN 325 MG PO TABS
650.0000 mg | ORAL_TABLET | ORAL | Status: DC | PRN
  Administered 2019-09-21: 650 mg via ORAL
  Filled 2019-09-21 (×3): qty 2

## 2019-09-21 MED ORDER — PANCRELIPASE (LIP-PROT-AMYL) 36000-114000 UNITS PO CPEP
36000.0000 [IU] | ORAL_CAPSULE | Freq: Three times a day (TID) | ORAL | Status: DC
  Administered 2019-09-21 – 2019-09-23 (×5): 36000 [IU] via ORAL
  Filled 2019-09-21 (×8): qty 1

## 2019-09-21 MED ORDER — PANTOPRAZOLE SODIUM 40 MG PO TBEC
40.0000 mg | DELAYED_RELEASE_TABLET | Freq: Two times a day (BID) | ORAL | Status: DC
  Administered 2019-09-21 – 2019-09-27 (×13): 40 mg via ORAL
  Filled 2019-09-21 (×13): qty 1

## 2019-09-21 MED ORDER — SENNOSIDES-DOCUSATE SODIUM 8.6-50 MG PO TABS: 1.0000 | ORAL_TABLET | Freq: Every evening | ORAL | Status: DC | PRN

## 2019-09-21 MED ORDER — HYDROCODONE-ACETAMINOPHEN 5-325 MG PO TABS
1.0000 | ORAL_TABLET | Freq: Four times a day (QID) | ORAL | Status: DC | PRN
  Administered 2019-09-21: 1 via ORAL
  Administered 2019-09-21 – 2019-09-25 (×8): 2 via ORAL
  Filled 2019-09-21 (×5): qty 2
  Filled 2019-09-21: qty 1
  Filled 2019-09-21 (×4): qty 2

## 2019-09-21 MED ORDER — ATORVASTATIN CALCIUM 40 MG PO TABS
40.0000 mg | ORAL_TABLET | Freq: Every day | ORAL | Status: DC
  Administered 2019-09-22 – 2019-09-26 (×5): 40 mg via ORAL
  Filled 2019-09-21 (×5): qty 1

## 2019-09-21 MED ORDER — ATORVASTATIN CALCIUM 80 MG PO TABS
80.0000 mg | ORAL_TABLET | Freq: Every day | ORAL | Status: DC
  Administered 2019-09-21: 80 mg via ORAL
  Filled 2019-09-21: qty 1

## 2019-09-21 MED ORDER — FERROUS SULFATE 325 (65 FE) MG PO TABS
325.0000 mg | ORAL_TABLET | Freq: Two times a day (BID) | ORAL | Status: DC
  Administered 2019-09-22 – 2019-09-27 (×11): 325 mg via ORAL
  Filled 2019-09-21 (×11): qty 1

## 2019-09-21 MED ORDER — MIRTAZAPINE 15 MG PO TABS
15.0000 mg | ORAL_TABLET | Freq: Every day | ORAL | Status: DC
  Administered 2019-09-21 – 2019-09-26 (×7): 15 mg via ORAL
  Filled 2019-09-21 (×8): qty 1

## 2019-09-21 MED ORDER — IOHEXOL 350 MG/ML SOLN
100.0000 mL | Freq: Once | INTRAVENOUS | Status: AC | PRN
  Administered 2019-09-21: 100 mL via INTRAVENOUS

## 2019-09-21 MED ORDER — MAGNESIUM SULFATE 2 GM/50ML IV SOLN
2.0000 g | Freq: Once | INTRAVENOUS | Status: AC
  Administered 2019-09-21: 2 g via INTRAVENOUS
  Filled 2019-09-21: qty 50

## 2019-09-21 MED ORDER — ONDANSETRON HCL 4 MG PO TABS
4.0000 mg | ORAL_TABLET | Freq: Three times a day (TID) | ORAL | Status: DC
  Administered 2019-09-21 – 2019-09-27 (×18): 4 mg via ORAL
  Filled 2019-09-21 (×18): qty 1

## 2019-09-21 MED ORDER — CLONAZEPAM 0.5 MG PO TABS
1.0000 mg | ORAL_TABLET | Freq: Two times a day (BID) | ORAL | Status: DC
  Administered 2019-09-21 – 2019-09-25 (×10): 1 mg via ORAL
  Filled 2019-09-21 (×10): qty 2

## 2019-09-21 MED ORDER — ACETAMINOPHEN 650 MG RE SUPP: 650.0000 mg | RECTAL | Status: DC | PRN

## 2019-09-21 MED ORDER — ACETAMINOPHEN 160 MG/5ML PO SOLN: 650.0000 mg | ORAL | Status: DC | PRN

## 2019-09-21 MED ORDER — SODIUM CHLORIDE 0.9% IV SOLUTION: Freq: Once | INTRAVENOUS | Status: DC

## 2019-09-21 MED ORDER — TRAMADOL HCL 50 MG PO TABS
50.0000 mg | ORAL_TABLET | Freq: Four times a day (QID) | ORAL | Status: DC | PRN
  Administered 2019-09-21 – 2019-09-27 (×7): 50 mg via ORAL
  Filled 2019-09-21 (×8): qty 1

## 2019-09-21 MED ORDER — STROKE: EARLY STAGES OF RECOVERY BOOK
Freq: Once | Status: DC
  Filled 2019-09-21 (×2): qty 1

## 2019-09-21 MED ORDER — IOHEXOL 350 MG/ML SOLN
75.0000 mL | Freq: Once | INTRAVENOUS | Status: AC | PRN
  Administered 2019-09-21: 75 mL via INTRAVENOUS

## 2019-09-21 NOTE — ED Notes (Signed)
Lunch Tray Ordered @ 1040. 

## 2019-09-21 NOTE — ED Notes (Signed)
Pt states she does not wear O2 at home. Pt currently on 2 liters for comfort and due to O2 dropping last night after receiving pain medication.This RN turned off O2 to reevaluate pt SpO2. Pt dropped to 85%. Pt placed back on 2 liters [place] with relief of 100%.  Pt endorses being SOB at rest.

## 2019-09-21 NOTE — Evaluation (Signed)
Occupational Therapy Evaluation Patient Details Name: Patricia Stanley MRN: [id number] DOB: [birth year] Today's Date: 09/21/2019    History of Present Illness 74 y.o. female with medical history significant of CVA in December, carotid artery stenosis status post right CEA 04/2019,depression, GERD, kidney stones, HTN, anemia, Chronic diarrhea who presented to the ED with left arm numbness and left leg weakness for a few days (worsening of chronic symptoms). Pt also with recent admit for adult failure to thrive. MRI revealed 8 mm acute ischemic left basal ganglia CVA.    Clinical Impression   Pt seen for OT eval in ED, presenting with the above. PTA pt residing at home with spouse who assists with mobility and ADL tasks (pt reports mobilizing with RW). Pt now presenting with worsened L side weakness (including dominant LUE), generalized deconditioning and impaired cognition. Pt requiring two person assist for safe completion of functional transfers (via HHA); pt able to take few steps in place but unable to safely mobilize further distances due to L knee buckle/instability and pt fatiguing with minimal activity. Pt spouse present and supportive throughout. She will benefit from continued acute OT services and feel she will benefit from CIR level therapies at time of discharge to maximize her overall safety and independence with ADL and mobility and to reduce level of caregiver burden.     Follow Up Recommendations  CIR;Supervision/Assistance - 24 hour    Equipment Recommendations  None recommended by OT    Recommendations for Other Services Rehab consult     Precautions / Restrictions Precautions Precautions: Fall Precaution Comments: L side weakness  Restrictions Weight Bearing Restrictions: No      Mobility Bed Mobility Overal bed mobility: Needs Assistance Bed Mobility: Supine to Sit;Sit to Supine     Supine to sit: Mod assist;+2 for physical assistance Sit to supine: Max assist;+2  for physical assistance   General bed mobility comments: pt requiring assist for LEs and trunk management, assist to scoot towards EOB - close guarding/minA while seated EOB due to pt's height in comparison to ED gurney/bed  Transfers Overall transfer level: Needs assistance Equipment used: 2 person hand held assist Transfers: Sit to/from Stand Sit to Stand: Mod assist;+2 physical assistance         General transfer comment: pt requiring boosting and steadying assist from EOB, requires support of LUE and L knee block due to buckling/weakness. increased assist to return to sitting/scoot back on bed given bed height     Balance Overall balance assessment: Needs assistance Sitting-balance support: Feet supported Sitting balance-Leahy Scale: Fair Sitting balance - Comments: close minguard - intermittent minA for sitting balance    Standing balance support: Bilateral upper extremity supported Standing balance-Leahy Scale: Poor Standing balance comment: reliant on external assist                           ADL either performed or assessed with clinical judgement   ADL Overall ADL's : Needs assistance/impaired Eating/Feeding: Set up;Sitting   Grooming: Minimal assistance;Sitting   Upper Body Bathing: Moderate assistance;Sitting   Lower Body Bathing: Maximal assistance;+2 for physical assistance;Sitting/lateral leans;Sit to/from stand   Upper Body Dressing : Moderate assistance;Sitting   Lower Body Dressing: Maximal assistance;+2 for physical assistance;Sitting/lateral leans;Sit to/from stand   Toilet Transfer: Moderate assistance;+2 for physical assistance;Stand-pivot Toilet Transfer Details (indicate cue type and reason): simulated via transfer to/from EOB Toileting- Clothing Manipulation and Hygiene: Maximal assistance;+2 for physical assistance;Sitting/lateral lean;Sit to/from stand  Functional mobility during ADLs: Moderate assistance;+2 for physical  assistance(HHA) General ADL Comments: pt with notable L side weakness, generalized deconditioning and impaired cognition      Vision Baseline Vision/History: Wears glasses Wears Glasses: Reading only Patient Visual Report: No change from baseline Vision Assessment?: Yes Eye Alignment: Within Functional Limits Ocular Range of Motion: Within Functional Limits Alignment/Gaze Preference: Within Defined Limits Tracking/Visual Pursuits: Decreased smoothness of eye movement to RIGHT inferior field;Decreased smoothness of eye movement to RIGHT superior field;Unable to hold eye position out of midline Additional Comments: will continue to assess      Perception     Praxis      Pertinent Vitals/Pain Pain Assessment: No/denies pain     Hand Dominance Left   Extremity/Trunk Assessment Upper Extremity Assessment Upper Extremity Assessment: LUE deficits/detail LUE Deficits / Details: grossly 3/5 shoulder and elbow strength, 1/5 wrist and significantly weak gross grasp  LUE Sensation: decreased light touch;decreased proprioception LUE Coordination: decreased fine motor;decreased gross motor   Lower Extremity Assessment Lower Extremity Assessment: Defer to PT evaluation   Cervical / Trunk Assessment Cervical / Trunk Assessment: Kyphotic   Communication Communication Communication: HOH   Cognition Arousal/Alertness: Lethargic;Awake/alert Behavior During Therapy: Flat affect Overall Cognitive Status: Impaired/Different from baseline Area of Impairment: Attention;Memory;Following commands;Awareness;Problem solving                   Current Attention Level: Sustained   Following Commands: Follows one step commands with increased time;Follows one step commands inconsistently   Awareness: Emergent Problem Solving: Slow processing;Requires verbal cues;Decreased initiation;Requires tactile cues General Comments: pt very lethargic initially but once sitting up more awake/alert. pt  slow to respond to questions/commands requiring increased time and cues to do so. Pt also HOH so often requiring repetition    General Comments  BP 174/93 sitting EOB (per neuro note pt okay for permissive HTN at this time); spO2 >95% on 3L O2. Pt's spouse present during session.     Exercises     Shoulder Instructions      Home Living Family/patient expects to be discharged to:: Private residence Living Arrangements: Spouse/significant other;Children Available Help at Discharge: Family;Available 24 hours/day Type of Home: House Home Access: Stairs to enter [employer] of Steps: 6 Entrance Stairs-Rails: Left;Right Home Layout: One level     Bathroom Shower/Tub: Producer, television/film/video: Standard     Home Equipment: Environmental consultant - 2 wheels;Bedside commode;Shower seat;Grab bars - tub/shower;Hand held shower head          Prior Functioning/Environment Level of Independence: Needs assistance  Gait / Transfers Assistance Needed: ambulates with RW and has needed additional support last month ADL's / Homemaking Assistance Needed: since her CVA spouse has been providing some assist for ADL            OT Problem List: Decreased strength;Decreased range of motion;Decreased activity tolerance;Impaired balance (sitting and/or standing);Decreased coordination;Decreased cognition;Decreased safety awareness;Impaired UE functional use;Impaired sensation      OT Treatment/Interventions: Self-care/ADL training;Therapeutic exercise;Energy conservation;DME and/or AE instruction;Therapeutic activities;Cognitive remediation/compensation;Patient/family education;Balance training;Neuromuscular education    OT Goals(Current goals can be found in the care plan section) Acute Rehab OT Goals Patient Stated Goal: to get stronger OT Goal Formulation: With patient/family Time For Goal Achievement: October 24, 2019 Potential to Achieve Goals: Good  OT Frequency: Min 2X/week   Barriers to  D/C:            Co-evaluation PT/OT/SLP Co-Evaluation/Treatment: Yes Reason for Co-Treatment: For patient/therapist safety;To address functional/ADL transfers  OT goals addressed during session: Strengthening/ROM;ADL's and self-care      AM-PAC OT "6 Clicks" Daily Activity     Outcome Measure Help from another person eating meals?: A Little Help from another person taking care of personal grooming?: A Little Help from another person toileting, which includes using toliet, bedpan, or urinal?: A Lot Help from another person bathing (including washing, rinsing, drying)?: A Lot Help from another person to put on and taking off regular upper body clothing?: A Lot Help from another person to put on and taking off regular lower body clothing?: A Lot 6 Click Score: 14   End of Session Equipment Utilized During Treatment: Gait belt Nurse Communication: Mobility status  Activity Tolerance: Patient tolerated treatment well Patient left: in bed;with call bell/phone within reach;with family/visitor present(MD present)  OT Visit Diagnosis: Hemiplegia and hemiparesis;Muscle weakness (generalized) (M62.81);Other abnormalities of gait and mobility (R26.89) Hemiplegia - Right/Left: Left Hemiplegia - dominant/non-dominant: Dominant Hemiplegia - caused by: Cerebral infarction                Time: 5102-5852 OT Time Calculation (min): 24 min Charges:  OT General Charges $OT Visit: 1 Visit OT Evaluation $OT Eval Moderate Complexity: [place], OT Acute Rehabilitation Services Pager [phone] Office [place] 09/21/2019, 5:55 PM

## 2019-09-21 NOTE — Progress Notes (Signed)
  Echocardiogram 2D Echocardiogram has been attempted. Patient with PT. Will reattempt at later time.  Patricia Stanley Patricia Stanley 09/21/2019, 3:15 PM

## 2019-09-21 NOTE — Progress Notes (Signed)
09/21/19 1830  PT Visit Information  Last PT Received On 09/21/19  Assistance Needed +2  PT/OT/SLP Co-Evaluation/Treatment Yes  Reason for Co-Treatment For patient/therapist safety;To address functional/ADL transfers  PT goals addressed during session Mobility/safety with mobility;Balance  History of Present Illness 74 y.o. female with medical history significant of CVA in December, carotid artery stenosis status post right CEA 04/2019,depression, GERD, kidney stones, HTN, anemia, Chronic diarrhea who presented to the ED with left arm numbness and left leg weakness for a few days (worsening of chronic symptoms). Pt also with recent admit for adult failure to thrive. MRI revealed 8 mm acute ischemic left basal ganglia CVA.   Precautions  Precautions Fall  Precaution Comments L side weakness   Restrictions  Weight Bearing Restrictions No  Home Living  Family/patient expects to be discharged to: Private residence  Living Arrangements Spouse/significant other;Children  Available Help at Discharge Family;Available 24 hours/day  Type of Home House  Home Access Stairs to enter  Entrance Stairs-Number of Steps 6  Entrance Stairs-Rails Left;Right  Home Layout One level  Bathroom Shower/Tub Walk-in Patent examiner - 2 wheels;BSC;Shower seat;Grab bars - tub/shower;Hand held shower head  Prior Function  Level of Independence Needs assistance  Gait / Transfers Assistance Needed ambulates with RW and has needed additional support last month  ADL's / [place] since her CVA spouse has been providing some assist for ADL  Communication  Communication HOH  Pain Assessment  Pain Assessment No/denies pain  Cognition  Arousal/Alertness Lethargic;Awake/alert  Behavior During Therapy Flat affect  Overall Cognitive Status Impaired/Different from baseline  Area of Impairment Attention;Memory;Following commands;Awareness;Problem solving   Current Attention Level Sustained  Memory Decreased short-term memory  Following Commands Follows one step commands with increased time;Follows one step commands inconsistently  Awareness Emergent  Problem Solving Slow processing;Requires verbal cues;Decreased initiation;Requires tactile cues  General Comments pt very lethargic initially but once sitting up more awake/alert. pt slow to respond to questions/commands requiring increased time and cues to do so. Pt also HOH so often requiring repetition   Upper Extremity Assessment  Upper Extremity Assessment Defer to OT evaluation  Lower Extremity Assessment  Lower Extremity Assessment LLE deficits/detail;Generalized weakness  LLE Deficits / Details Grossly 2/5 for ankle PF; 3/5 at hip and knee flexors. Decreased sensation from knee down to foot.   LLE Sensation decreased light touch  LLE Coordination decreased gross motor  Cervical / Trunk Assessment  Cervical / Trunk Assessment Kyphotic  Bed Mobility  Overal bed mobility Needs Assistance  Bed Mobility Supine to Sit;Sit to Supine;Rolling  Rolling Mod assist  Supine to sit Mod assist;+2 for physical assistance  Sit to supine Max assist;+2 for physical assistance  General bed mobility comments pt requiring assist for LEs and trunk management, assist to scoot towards EOB - close guarding/minA while seated EOB due to pt's height in comparison to ED gurney/bed. pt rolling to L/R end of session for changing of bed linens   Transfers  Overall transfer level Needs assistance  Equipment used 2 person hand held assist  Transfers Sit to/from Stand  Sit to Stand Mod assist;+2 physical assistance  General transfer comment pt requiring boosting and steadying assist from EOB, requires support of LUE and L knee block due to buckling/weakness. increased assist to return to sitting/scoot back on bed given bed height.   Ambulation/Gait  General Gait Details Performed marching tasks at EOB for pre-gait. Pt  requiring mod A +2 for steadying  and manual blocking of L knee. Pt also hyperextending L knee for stability.   Modified Rankin (Stroke Patients Only)  Pre-Morbid Rankin Score 4  Modified Rankin 4  Balance  Overall balance assessment Needs assistance  Sitting-balance support Feet supported  Sitting balance-Leahy Scale Poor  Sitting balance - Comments close minguard - intermittent minA for sitting balance   Standing balance support Bilateral upper extremity supported  Standing balance-Leahy Scale Poor  Standing balance comment reliant on external assist  General Comments  General comments (skin integrity, edema, etc.) BP 174/93 sitting EOB (per neuro note pt okay for permissive HTN at this time); spO2 >95% on 3L O2. Pt's spouse present during session.   PT - End of Session  Equipment Utilized During Treatment Gait belt  Activity Tolerance Patient tolerated treatment well  Patient left in bed;with call bell/phone within reach;with family/visitor present;Other (comment) (MD present)  Nurse Communication Mobility status  PT Assessment  PT Recommendation/Assessment Patient needs continued PT services  PT Visit Diagnosis Unsteadiness on feet (R26.81);Muscle weakness (generalized) (M62.81);Difficulty in walking, not elsewhere classified (R26.2)  PT Problem List Decreased strength;Decreased activity tolerance;Decreased balance;Decreased mobility;Decreased cognition;Decreased coordination;Decreased knowledge of use of DME  PT Plan  PT Frequency (ACUTE ONLY) Min 4X/week  PT Treatment/Interventions (ACUTE ONLY) DME instruction;Gait training;Stair training;Functional mobility training;Therapeutic activities;Therapeutic exercise;Balance training;Patient/family education;Cognitive remediation  AM-PAC PT "6 Clicks" Mobility Outcome Measure (Version 2)  Help needed turning from your back to your side while in a flat bed without using bedrails? 2  Help needed moving from lying on your back to sitting on  the side of a flat bed without using bedrails? 2  Help needed moving to and from a bed to a chair (including a wheelchair)? 2  Help needed standing up from a chair using your arms (e.g., wheelchair or bedside chair)? 2  Help needed to walk in hospital room? 2  Help needed climbing 3-5 steps with a railing?  1  6 Click Score 11  Consider Recommendation of Discharge To: CIR/SNF/LTACH  PT Recommendation  Follow Up Recommendations CIR  PT equipment Other (comment) (TBD)  Individuals Consulted  Consulted and Agree with Results and Recommendations Patient;Family member/caregiver  Family Member Consulted husband  Acute Rehab PT Goals  Patient Stated Goal to get stronger  PT Goal Formulation With patient/family  Time For Goal Achievement 07-Oct-2019  Potential to Achieve Goals Good  PT Time Calculation  PT Start Time (ACUTE ONLY) 1459  PT Stop Time (ACUTE ONLY) 1523  PT Time Calculation (min) (ACUTE ONLY) 24 min  PT General Charges  $$ ACUTE PT VISIT 1 Visit  PT Evaluation  $PT Eval Moderate Complexity 1 Mod  Written Expression  Dominant Hand Left   Pt admitted secondary to problem above with deficits above. Seen with OT in the ED for pt safety given higher stretcher height and severity of deficits. Pt requiring mod A +2 to stand and perform pre gait activity. Pt with poor coordination and increased weakness in LLE. Required manual blocking and at times hyperextending LLE for stability. Feel pt would benefit from CIR level therapies to increase independence and safety with mobility. Has good support from husband. Will continue to follow acutely to maximize functional mobility independence and safety.   Farley Ly, PT, DPT  Acute Rehabilitation Services  Pager: [phone] Office: [phone]

## 2019-09-21 NOTE — ED Notes (Addendum)
MD made aware of increased NIH to from 2 to 5

## 2019-09-21 NOTE — Progress Notes (Signed)
Patricia Stanley  WSF:[id number] DOB: 1945/08/29 DOA: 09/20/2019 PCP: Marden Noble, MD    Brief Narrative:  74 year old with history of CVA December 2020, carotid artery stenosis status post right CEA December 2020, depression, GERD, kidney stones, HTN, chronic diarrhea, and chronic anemia who presented to the ED with a left arm numbness and left leg weakness for a few days.  This represents a worsening of her chronic symptoms dating back to December 2020.  The patient has recently undergone an evaluation for failure to thrive and was found to have chronic diarrhea with GI initiating Creon, cholestyramine, and Imodium.  She was felt to be too frail for endoscopic evaluation.  Significant Events: 5/18 admit via [facility]  Antimicrobials:  None  Subjective: The patient reports waxing and waning worsening of her left arm and leg weakness and numbness which continues during her stay the ED.  She is awake and alert at the time of my visit.  She denies any significant shortness of breath but her daughter at bedside reports that she has noticed lately even when just trying to get up to go to the bathroom the patient will have to pause to breathe and seems to be extremely short of breath.  The patient has a remote history of smoking but has not smoked in over 30 to 40 years and smoked less than a pack a day for approximately 15 to 20 years back when she did.  She has not had a previous pulmonary evaluation.  She has not been very active of late.  Assessment & Plan:  8 mm acute ischemic left basal ganglia CVA Neurology following and directing evaluation -continue aspirin for now -decision to be made on adding Plavix  Acute hypoxic respiratory failure - bronchiectasis versus atypical pneumonia Patient requiring 2 L nasal cannula to keep sats greater than 85% -lung hyperexpansion and chronic bronchitic changes noted on CXR December 2020 -suggestion of probable chronic interstitial lung disease via CT  chest December 2020 - CTa this admit notes what appears to be interstitial pulmonary disease -for now we will evaluate her neurologic symptoms but I suspect she will require home oxygen at this point as she will need referral to the pulmonary office for more extensive evaluation of what appears to be indolent interstitial lung disease  Chronic normocytic anemia - acutely symptomatic Hemoglobin 7.1 at presentation but was 7.2 5/14 -hemoglobin improved as expected with transfusion of 1 unit PRBC this admission -suspect this is anemia of chronic disease -we will check iron indices but must be aware that transfusion could alter the results  Significantly elevated D-dimer 14.6 at time of presentation - CTa w/o evidence of PE - COVID testing negative   Possible esophageal thickening Noted on CT chest December 2020 with recommendation for outpatient EGD - barium esophagram December 2020 without evidence of esophageal leak or esophageal mass  Slightly dilated pancreatic and common bile ducts Noted per CT chest/abdomen December 2020 with recommendation for outpatient MRCP or ERCP  Equivocal UA UA is remarkable for a large number of white blood cells as well as leukocytes but pt w/ no specific UTI sx - follow w/o abx for now   Hypomagnesemia Magnesium 1.2 at presentation -supplement and follow-up in a.m.  Major depression  HTN Avoid overaggressive correction of blood pressure at this time  HLD Continue home medical therapy  Malnutrition of moderate degree   DVT prophylaxis: SCDs Code Status: NO CODE BLUE Family Communication: Spoke with daughter at bedside Status is: Inpatient  Remains inpatient appropriate because:Ongoing diagnostic testing needed not appropriate for outpatient work up   Dispo: The patient is from: Home              Anticipated d/c is to: [facility] at this time              Anticipated d/c date is: 2 days              Patient currently is not medically stable to  d/c.   Consultants:  none  Objective: Blood pressure 135/68, pulse 69, temperature 98.1 F (36.7 C), temperature source Oral, resp. rate 18, SpO2 94 %.  Intake/Output Summary (Last 24 hours) at 09/21/2019 1526 Last data filed at 09/21/2019 1439 Gross per 24 hour  Intake 365 ml  Output -  Net 365 ml   There were no vitals filed for this visit.  Examination: General: No acute respiratory distress at rest on supplemental oxygen Lungs: Fine diffuse crackles bilaterally without wheezing Cardiovascular: Regular rate and rhythm without murmur gallop or rub normal S1 and S2 Abdomen: Nontender, nondistended, soft, bowel sounds positive, no rebound, no ascites, no appreciable mass Extremities: No significant cyanosis, clubbing, or edema bilateral lower extremities  CBC: Recent Labs  Lab 09/16/19 0248 09/20/19 2013 09/21/19 0821  WBC 12.8* 10.6* 17.0*  NEUTROABS  --  8.2*  --   HGB 7.2* 7.1* 8.8*  HCT 22.8* 23.5* 28.5*  MCV 88.7 92.9 90.5  PLT 276 313 282   Basic Metabolic Panel: Recent Labs  Lab 09/15/19 0734 09/16/19 0248 09/20/19 2013 09/21/19 0226  NA 137 136 141  --   K 3.7 3.9 4.7  --   CL 104 106 113*  --   CO2 24 24 22   --   GLUCOSE 101* 99 95  --   BUN 9 13 22   --   CREATININE 0.72 0.72 1.00  --   CALCIUM 8.1* 7.8* 8.8*  --   MG 1.3* 2.2  --  1.2*  PHOS  --   --   --  2.7   GFR: Estimated Creatinine Clearance: 37.8 mL/min (by C-G formula based on SCr of 1 mg/dL).  Liver Function Tests: Recent Labs  Lab 09/21/19 0115  AST 43*  ALT 39  ALKPHOS 94  BILITOT 0.9  PROT 5.3*  ALBUMIN 1.6*    Coagulation Profile: Recent Labs  Lab 09/20/19 2013  INR 1.2    Cardiac Enzymes: Recent Labs  Lab 09/21/19 0226  CKTOTAL 40    HbA1C: Hgb A1c MFr Bld  Date/Time Value Ref Range Status  09/12/2019 03:47 AM 5.7 (H) 4.8 - 5.6 % Final    Comment:    (NOTE) Pre diabetes:          5.7%-6.4% Diabetes:              >6.4% Glycemic control for    <7.0% adults with diabetes   04/19/2019 04:14 AM 5.8 (H) 4.8 - 5.6 % Final    Comment:    (NOTE) Pre diabetes:          5.7%-6.4% Diabetes:              >6.4% Glycemic control for   <7.0% adults with diabetes     CBG: Recent Labs  Lab 09/20/19 1936  GLUCAP 93    Recent Results (from the past 240 hour(s))  Respiratory Panel by RT PCR (Flu A&B, Covid) - Nasopharyngeal Swab     Status: None   Collection Time: 09/11/19 11:52  PM   Specimen: Nasopharyngeal Swab  Result Value Ref Range Status   SARS Coronavirus 2 by RT PCR NEGATIVE NEGATIVE Final    Comment: (NOTE) SARS-CoV-2 target nucleic acids are NOT DETECTED. The SARS-CoV-2 RNA is generally detectable in upper respiratoy specimens during the acute phase of infection. The lowest concentration of SARS-CoV-2 viral copies this assay can detect is 131 copies/mL. A negative result does not preclude SARS-Cov-2 infection and should not be used as the sole basis for treatment or other patient management decisions. A negative result may occur with  improper specimen collection/handling, submission of specimen other than nasopharyngeal swab, presence of viral mutation(s) within the areas targeted by this assay, and inadequate number of viral copies (<131 copies/mL). A negative result must be combined with clinical observations, patient history, and epidemiological information. The expected result is Negative. Fact Sheet for Patients:  https://www.moore.com/ Fact Sheet for Healthcare Providers:  https://www.young.biz/ This test is not yet ap proved or cleared by the [place] FDA and  has been authorized for detection and/or diagnosis of SARS-CoV-2 by FDA under an Emergency Use Authorization (EUA). This EUA will remain  in effect (meaning this test can be used) for the duration of the COVID-19 declaration under Section 564(b)(1) of the Act, 21 U.S.C. section 360bbb-3(b)(1), unless the  authorization is terminated or revoked sooner.    Influenza A by PCR NEGATIVE NEGATIVE Final   Influenza B by PCR NEGATIVE NEGATIVE Final    Comment: (NOTE) The Xpert Xpress SARS-CoV-2/FLU/RSV assay is intended as an aid in  the diagnosis of influenza from Nasopharyngeal swab specimens and  should not be used as a sole basis for treatment. Nasal washings and  aspirates are unacceptable for Xpert Xpress SARS-CoV-2/FLU/RSV  testing. Fact Sheet for Patients: https://www.moore.com/ Fact Sheet for Healthcare Providers: https://www.young.biz/ This test is not yet approved or cleared by the [place] FDA and  has been authorized for detection and/or diagnosis of SARS-CoV-2 by  FDA under an Emergency Use Authorization (EUA). This EUA will remain  in effect (meaning this test can be used) for the duration of the  Covid-19 declaration under Section 564(b)(1) of the Act, 21  U.S.C. section 360bbb-3(b)(1), unless the authorization is  terminated or revoked. Performed at [facility], [address] [place], [place] [address]   Gastrointestinal Panel by PCR , Stool     Status: None   Collection Time: 09/12/19  2:33 AM   Specimen: Stool  Result Value Ref Range Status   Campylobacter species NOT DETECTED NOT DETECTED Final   Plesimonas shigelloides NOT DETECTED NOT DETECTED Final   Salmonella species NOT DETECTED NOT DETECTED Final   Yersinia enterocolitica NOT DETECTED NOT DETECTED Final   Vibrio species NOT DETECTED NOT DETECTED Final   Vibrio cholerae NOT DETECTED NOT DETECTED Final   Enteroaggregative E coli (EAEC) NOT DETECTED NOT DETECTED Final   Enteropathogenic E coli (EPEC) NOT DETECTED NOT DETECTED Final   Enterotoxigenic E coli (ETEC) NOT DETECTED NOT DETECTED Final   Shiga like toxin producing E coli (STEC) NOT DETECTED NOT DETECTED Final   Shigella/Enteroinvasive E coli (EIEC) NOT DETECTED NOT DETECTED Final   Cryptosporidium NOT  DETECTED NOT DETECTED Final   Cyclospora cayetanensis NOT DETECTED NOT DETECTED Final   Entamoeba histolytica NOT DETECTED NOT DETECTED Final   Giardia lamblia NOT DETECTED NOT DETECTED Final   Adenovirus F40/41 NOT DETECTED NOT DETECTED Final   Astrovirus NOT DETECTED NOT DETECTED Final   Norovirus GI/GII NOT DETECTED NOT DETECTED  Final   Rotavirus A NOT DETECTED NOT DETECTED Final   Sapovirus (I, II, IV, and V) NOT DETECTED NOT DETECTED Final    Comment: Performed at [facility], [place], [place] [address]  C Difficile Quick Screen w PCR reflex     Status: None   Collection Time: 09/13/19 11:50 AM   Specimen: STOOL  Result Value Ref Range Status   C Diff antigen NEGATIVE NEGATIVE Final   C Diff toxin NEGATIVE NEGATIVE Final   C Diff interpretation No C. difficile detected.  Final    Comment: Performed at [facility], [place] [address] [place], [place] [address]  SARS Coronavirus 2 by RT PCR (hospital order, performed in [facility] hospital lab) Nasopharyngeal Nasopharyngeal Swab     Status: None   Collection Time: 09/21/19  1:36 AM   Specimen: Nasopharyngeal Swab  Result Value Ref Range Status   SARS Coronavirus 2 NEGATIVE NEGATIVE Final    Comment: (NOTE) SARS-CoV-2 target nucleic acids are NOT DETECTED. The SARS-CoV-2 RNA is generally detectable in upper and lower respiratory specimens during the acute phase of infection. The lowest concentration of SARS-CoV-2 viral copies this assay can detect is 250 copies / mL. A negative result does not preclude SARS-CoV-2 infection and should not be used as the sole basis for treatment or other patient management decisions.  A negative result may occur with improper specimen collection / handling, submission of specimen other than nasopharyngeal swab, presence of viral mutation(s) within the areas targeted by this assay, and inadequate number of viral copies (<250 copies / mL). A negative result must be  combined with clinical observations, patient history, and epidemiological information. Fact Sheet for Patients:   StrictlyIdeas.no Fact Sheet for Healthcare Providers: BankingDealers.co.za This test is not yet approved or cleared  by the [place] FDA and has been authorized for detection and/or diagnosis of SARS-CoV-2 by FDA under an Emergency Use Authorization (EUA).  This EUA will remain in effect (meaning this test can be used) for the duration of the COVID-19 declaration under Section 564(b)(1) of the Act, 21 U.S.C. section 360bbb-3(b)(1), unless the authorization is terminated or revoked sooner. Performed at [facility], [place] [address] [place], [place] [address]      Scheduled Meds: .  stroke: mapping our early stages of recovery book   Does not apply Once  . sodium chloride   Intravenous Once  . aspirin  325 mg Oral Daily  . atorvastatin  80 mg Oral q1800  . clonazePAM  1 mg Oral BID  . ferrous sulfate  325 mg Oral BID WC  . lipase/protease/amylase  36,000 Units Oral TID WC  . mirtazapine  15 mg Oral QHS  . pantoprazole  40 mg Oral BID AC   Continuous Infusions: . sodium chloride 75 mL/hr at 09/21/19 1215     LOS: 0 days   Cherene Altes, MD [facility] Office  [phone] Pager - Text Page per Shea Evans  If 7PM-7AM, please contact night-coverage per Amion 09/21/2019, 3:26 PM

## 2019-09-21 NOTE — Progress Notes (Signed)
STROKE TEAM PROGRESS NOTE   INTERVAL HISTORY Husband at bedside. Pt lying in bed, very lethargic and cachectic. They stated that she had left sided residue weakness since last stroke in 04/2019 but recently getting worse, especially the left hand and wrist. CTA head and neck done this am.   Vitals:   09/21/19 0630 09/21/19 0645 09/21/19 0701 09/21/19 0731  BP: (!) 160/83 (!) 151/112 (!) 175/134 (!) 129/103  Pulse: 74 84 77 72  Resp: '16 18 19 15  ' Temp:      TempSrc:      SpO2: 98% 96% 98% 91%    CBC:  Recent Labs  Lab 09/20/19 2013 09/21/19 0821  WBC 10.6* 17.0*  NEUTROABS 8.2*  --   HGB 7.1* 8.8*  HCT 23.5* 28.5*  MCV 92.9 90.5  PLT 313 546    Basic Metabolic Panel:  Recent Labs  Lab 09/16/19 0248 09/20/19 2013 09/21/19 0226  NA 136 141  --   K 3.9 4.7  --   CL 106 113*  --   CO2 24 22  --   GLUCOSE 99 95  --   BUN 13 22  --   CREATININE 0.72 1.00  --   CALCIUM 7.8* 8.8*  --   MG 2.2  --  1.2*  PHOS  --   --  2.7   Lipid Panel:     Component Value Date/Time   CHOL 59 09/21/2019 0226   TRIG 53 09/21/2019 0226   HDL 20 (L) 09/21/2019 0226   CHOLHDL 3.0 09/21/2019 0226   VLDL 11 09/21/2019 0226   LDLCALC 28 09/21/2019 0226   HgbA1c:  Lab Results  Component Value Date   HGBA1C 5.7 (H) 09/12/2019   Urine Drug Screen: No results found for: LABOPIA, COCAINSCRNUR, LABBENZ, AMPHETMU, THCU, LABBARB  Alcohol Level     Component Value Date/Time   ETH <10 04/18/2019 1234    IMAGING past 24 hours CT ANGIO HEAD W OR WO CONTRAST  Result Date: 09/21/2019 CLINICAL DATA:  Follow-up stroke.  Left-sided weakness. EXAM: CT ANGIOGRAPHY HEAD AND NECK TECHNIQUE: Multidetector CT imaging of the head and neck was performed using the standard protocol during bolus administration of intravenous contrast. Multiplanar CT image reconstructions and MIPs were obtained to evaluate the vascular anatomy. Carotid stenosis measurements (when applicable) are obtained utilizing NASCET  criteria, using the distal internal carotid diameter as the denominator. CONTRAST:  151m OMNIPAQUE IOHEXOL 350 MG/ML SOLN COMPARISON:  MRI head and CT head 09/20/2019. CT angio head and neck 04/19/2019 FINDINGS: CTA NECK FINDINGS Aortic arch: Atherosclerotic aortic arch. Mild stenosis origin of left common carotid artery. Atherosclerotic calcification left subclavian artery. Moderate stenosis left subclavian artery distal to the vertebral origin. Right carotid system: Postop right carotid endarterectomy. No significant right carotid stenosis Left carotid system: Mild stenosis at the origin and proximal aspect of the left common carotid artery unchanged. Mild atherosclerotic disease left carotid bifurcation without significant stenosis. Vertebral arteries: Mild stenosis origin of right vertebral artery. No additional right vertebral artery stenosis to the skull base. Left vertebral artery patent to the skull base without significant stenosis. Skeleton: Mild cervical spine degenerative change. No acute skeletal abnormality. Compression fractures of T5 and T6 appear chronic. Other neck: Negative for mass or adenopathy in the neck. Upper chest: Chest CT from today reported separately. Review of the MIP images confirms the above findings CTA HEAD FINDINGS Anterior circulation: Atherosclerotic calcification in the cavernous carotid bilaterally. Moderate stenosis of the supraclinoid internal carotid artery  on the left. Both anterior cerebral arteries are patent without stenosis. Hypoplastic left A1 segment. Moderate stenosis right M1 segment.  Right MCA branches patent. Moderate stenosis mid and distal left M1 segment. Left MCA branches patent with focal moderate stenosis in the sylvian fissure M2 branch unchanged. Posterior circulation: Both vertebral arteries patent to the basilar. PICA patent bilaterally. Moderate stenosis left vertebral artery at the left PICA level. Atherosclerotic disease in the basilar artery with  moderate stenosis proximally. This is unchanged. Moderate stenosis of the proximal P2 branch bilaterally. Additional moderate stenosis in the distal PCA bilaterally. No aneurysm identified as question previously. Venous sinuses: Normal venous enhancement Anatomic variants: None Review of the MIP images confirms the above findings IMPRESSION: 1. Postop right carotid endarterectomy which is widely patent. 2. Mild stenosis proximal left common carotid artery. No significant stenosis in the left carotid bifurcation 3. Both vertebral arteries patent to the basilar with moderate stenosis left V4 segment unchanged. In addition, there is moderate stenosis proximal basilar and moderate stenosis in the posterior cerebral arteries bilaterally. 4. Negative for intracranial large vessel occlusion. 5. Moderate stenosis in the middle cerebral arteries bilaterally unchanged. Electronically Signed   By: Franchot Gallo M.D.   On: 09/21/2019 09:59   DG Abd 1 View  Result Date: 09/21/2019 CLINICAL DATA:  Obstipation EXAM: ABDOMEN - 1 VIEW COMPARISON:  09/13/2019 FINDINGS: Nonobstructed gas pattern with mild stool. Gas present in the rectum. Dystrophic gluteal calcifications. Small calcification in the left upper quadrant, felt to correspond to upper pole stone in the kidney. IMPRESSION: Nonobstructed bowel-gas pattern. Electronically Signed   By: Donavan Foil M.D.   On: 09/21/2019 00:36   CT Head Wo Contrast  Result Date: 09/20/2019 CLINICAL DATA:  Left hand paralysis and numbness EXAM: CT HEAD WITHOUT CONTRAST TECHNIQUE: Contiguous axial images were obtained from the base of the skull through the vertex without intravenous contrast. COMPARISON:  MRI 04/18/2019, CT brain 04/18/2019 FINDINGS: Brain: No acute territorial infarction, hemorrhage or intracranial mass. Atrophy and chronic small vessel ischemic change of the white matter. Multiple bilateral chronic infarcts involving the white matter, basal ganglia and thalamus.  Small focal hypodensity within the left cerebellum, not clearly seen on the previous exam and potentially representing age indeterminate lacunar infarct. Stable ventricle size. Vascular: No hyperdense vessels.  Carotid vascular calcification Skull: Normal. Negative for fracture or focal lesion. Sinuses/Orbits: No acute finding. Other: None IMPRESSION: 1. Negative for intracranial hemorrhage or territorial infarction. 2. Small focal hypodensity in the left cerebellum, may reflect age indeterminate lacunar infarct. 3. Atrophy, chronic small vessel ischemic changes of the white matter and multiple bilateral chronic appearing lacunar infarcts. Electronically Signed   By: Donavan Foil M.D.   On: 09/20/2019 20:09   CT ANGIO NECK W OR WO CONTRAST  Result Date: 09/21/2019 CLINICAL DATA:  Follow-up stroke.  Left-sided weakness. EXAM: CT ANGIOGRAPHY HEAD AND NECK TECHNIQUE: Multidetector CT imaging of the head and neck was performed using the standard protocol during bolus administration of intravenous contrast. Multiplanar CT image reconstructions and MIPs were obtained to evaluate the vascular anatomy. Carotid stenosis measurements (when applicable) are obtained utilizing NASCET criteria, using the distal internal carotid diameter as the denominator. CONTRAST:  171m OMNIPAQUE IOHEXOL 350 MG/ML SOLN COMPARISON:  MRI head and CT head 09/20/2019. CT angio head and neck 04/19/2019 FINDINGS: CTA NECK FINDINGS Aortic arch: Atherosclerotic aortic arch. Mild stenosis origin of left common carotid artery. Atherosclerotic calcification left subclavian artery. Moderate stenosis left subclavian artery distal to the vertebral  origin. Right carotid system: Postop right carotid endarterectomy. No significant right carotid stenosis Left carotid system: Mild stenosis at the origin and proximal aspect of the left common carotid artery unchanged. Mild atherosclerotic disease left carotid bifurcation without significant stenosis.  Vertebral arteries: Mild stenosis origin of right vertebral artery. No additional right vertebral artery stenosis to the skull base. Left vertebral artery patent to the skull base without significant stenosis. Skeleton: Mild cervical spine degenerative change. No acute skeletal abnormality. Compression fractures of T5 and T6 appear chronic. Other neck: Negative for mass or adenopathy in the neck. Upper chest: Chest CT from today reported separately. Review of the MIP images confirms the above findings CTA HEAD FINDINGS Anterior circulation: Atherosclerotic calcification in the cavernous carotid bilaterally. Moderate stenosis of the supraclinoid internal carotid artery on the left. Both anterior cerebral arteries are patent without stenosis. Hypoplastic left A1 segment. Moderate stenosis right M1 segment.  Right MCA branches patent. Moderate stenosis mid and distal left M1 segment. Left MCA branches patent with focal moderate stenosis in the sylvian fissure M2 branch unchanged. Posterior circulation: Both vertebral arteries patent to the basilar. PICA patent bilaterally. Moderate stenosis left vertebral artery at the left PICA level. Atherosclerotic disease in the basilar artery with moderate stenosis proximally. This is unchanged. Moderate stenosis of the proximal P2 branch bilaterally. Additional moderate stenosis in the distal PCA bilaterally. No aneurysm identified as question previously. Venous sinuses: Normal venous enhancement Anatomic variants: None Review of the MIP images confirms the above findings IMPRESSION: 1. Postop right carotid endarterectomy which is widely patent. 2. Mild stenosis proximal left common carotid artery. No significant stenosis in the left carotid bifurcation 3. Both vertebral arteries patent to the basilar with moderate stenosis left V4 segment unchanged. In addition, there is moderate stenosis proximal basilar and moderate stenosis in the posterior cerebral arteries bilaterally. 4.  Negative for intracranial large vessel occlusion. 5. Moderate stenosis in the middle cerebral arteries bilaterally unchanged. Electronically Signed   By: Franchot Gallo M.D.   On: 09/21/2019 09:59   CT ANGIO CHEST PE W OR WO CONTRAST  Result Date: 09/21/2019 CLINICAL DATA:  Shortness of breath EXAM: CT ANGIOGRAPHY CHEST WITH CONTRAST TECHNIQUE: Multidetector CT imaging of the chest was performed using the standard protocol during bolus administration of intravenous contrast. Multiplanar CT image reconstructions and MIPs were obtained to evaluate the vascular anatomy. CONTRAST:  50 mL OMNIPAQUE IOHEXOL 350 MG/ML SOLN COMPARISON:  Chest radiograph Sep 21, 2019; chest CT April 21, 2019 FINDINGS: Cardiovascular: There is no demonstrable pulmonary embolus. There is no thoracic aortic aneurysm or dissection. There are scattered foci of calcification in visualized great vessels. There are foci of aortic atherosclerosis. There are multiple foci of coronary artery calcification. There is no pericardial effusion or pericardial thickening. Mediastinum/Nodes: Thyroid appears unremarkable. There are scattered subcentimeter mediastinal lymph nodes. There are lymph nodes in the subcarinal region, largest measuring 1.2 x 1.2 cm. There are lymph nodes anterior to the carina, largest measuring 1.6 x 1.3 cm. There is an aortopulmonary window lymph node measuring 1.8 x 1.1 cm. There is a lymph node in the inferior right hilum measuring 1.1 x 1.0 cm. There is fluid in portions of the mid to distal esophagus. Lungs/Pleura: There is a degree of underlying centrilobular emphysematous change there is extensive ground-glass type opacity throughout the lungs bilaterally, most severe in the upper lobes somewhat posteriorly but present to varying degrees throughout the lungs bilaterally. There are moderate free-flowing pleural effusions bilaterally as well. There is  a degree of underlying fibrosis, primarily in the lung bases. There is  also a degree of lower lobe bronchiectatic change Upper Abdomen: There is reflux of contrast into the inferior vena cava and hepatic veins. Visualized upper abdominal structures otherwise appear unremarkable. Musculoskeletal: There is slight anterior wedging of several lower thoracic vertebral bodies. There is no appreciable blastic or lytic bone lesion. No evident chest wall lesion. Review of the MIP images confirms the above findings. IMPRESSION: 1. No demonstrable pulmonary embolus. No thoracic aortic aneurysm or dissection. There is aortic atherosclerosis as well as foci of great vessel and coronary artery calcification. 2. Multifocal airspace opacity consistent with pneumonia throughout the lungs bilaterally. Moderate free-flowing pleural effusions. No appreciable consolidation. Question atypical organism pneumonia. In this regard, advise check of COVID-19 status. 3. Underlying degree of centrilobular emphysema. There is a degree of fibrosis, primarily in the lung bases as well as lower lobe bronchiectatic change. There may be a degree of underlying usual interstitial pneumonitis in the presence of emphysematous change. 4. Several enlarged lymph nodes which are of uncertain etiology. These lymph nodes potentially may have reactive etiology given the extensive parenchymal lung abnormalities. 5. Fluid in the mid to distal esophagus which may represent spontaneous reflux 1 motility disorder. 6. Reflux of contrast into the inferior vena cava and hepatic veins may be indicative of increased right heart pressure. Aortic Atherosclerosis (ICD10-I70.0) and Emphysema (ICD10-J43.9). Electronically Signed   By: Lowella Grip III M.D.   On: 09/21/2019 09:41   MR BRAIN WO CONTRAST  Result Date: 09/20/2019 CLINICAL DATA:  Initial evaluation for acute left upper extremity weakness. EXAM: MRI HEAD WITHOUT CONTRAST MRI CERVICAL SPINE WITHOUT CONTRAST TECHNIQUE: Multiplanar, multiecho pulse sequences of the brain and  surrounding structures, and cervical spine, to include the craniocervical junction and cervicothoracic junction, were obtained without intravenous contrast. COMPARISON:  Comparison made with prior CT from earlier the same day. FINDINGS: MRI HEAD FINDINGS Brain: Generalized age-related cerebral atrophy. Extensive chronic microvascular ischemic disease with multiple remote lacunar infarcts seen clustered about the bilateral basal ganglia and thalami. Patchy involvement of the pons noted as well. 8 mm focus of restricted diffusion involving the left caudate consistent with an acute ischemic infarct (series 5, image 8). Associated susceptibility artifact suggests petechial hemorrhage without frank hemorrhagic transformation. No additional punctate 4 mm cortical infarct noted at the left occipital lobe (a series 5, image 76). No associated hemorrhage or mass effect. Additional faint subcentimeter diffusion abnormality at the high right frontal centrum semi ovale consistent with subacute small vessel ischemia. Mild residual diffusion abnormality seen about a largely chronic appearing lacunar infarct at the posterior right basal ganglia. Additional 12 mm vague focus of diffusion abnormality involving the left cerebellum consistent with a subacute left cerebellar infarct. No associated hemorrhage or mass effect. Multiple scattered chronic micro hemorrhages seen involving both cerebral and cerebellar hemispheres, most notable about the deep gray nuclei, favored to be related to chronic poorly controlled hypertension. Sequelae of cerebral amyloid would be the primary differential consideration. No mass lesion, midline shift or mass effect. No hydrocephalus. No extra-axial fluid collection. Pituitary gland suprasellar region normal. Midline structures intact. Vascular: Major intracranial vascular flow voids are maintained. Skull and upper cervical spine: Craniocervical junction within normal limits. Bone marrow signal  intensity normal. No scalp soft tissue abnormality. Sinuses/Orbits: Globes and orbital soft tissues within normal limits. Paranasal sinuses are largely clear. No significant mastoid effusion. Other: None. MRI CERVICAL SPINE FINDINGS Alignment: Vertebral bodies normally aligned with preservation of  the normal cervical lordosis. No listhesis. Vertebrae: Vertebral body height maintained without evidence for acute or chronic fracture. Bone marrow signal intensity somewhat heterogeneous but within normal limits. No discrete or worrisome osseous lesions. No abnormal marrow edema. Cord: Signal intensity within the cervical spinal cord is within normal limits. Posterior Fossa, vertebral arteries, paraspinal tissues: Craniocervical junction within normal limits. Paraspinous and prevertebral soft tissues normal. Normal flow voids seen within the vertebral arteries bilaterally. Disc levels: C2-C3: Unremarkable. C3-C4: Broad central disc protrusion indents the ventral thecal sac, partially effacing the ventral CSF. Mild spinal stenosis without cord deformity. Superimposed uncovertebral hypertrophy without significant foraminal encroachment. C4-C5: Diffuse degenerative disc osteophyte with bilateral uncovertebral hypertrophy. Broad posterior component flattens and effaces the ventral thecal sac, greater on the left. Superimposed mild facet and ligament flavum hypertrophy. Resultant moderate spinal stenosis with mild cord flattening. Left greater than right uncinate spurring with resultant moderate left worse than right C5 foraminal stenosis. C5-C6: Chronic intervertebral disc space narrowing with diffuse degenerative disc osteophyte. Broad posterior component flattens and partially faces the ventral thecal sac resultant mild spinal stenosis. No cord deformity. Severe right with mild left C6 foraminal stenosis. C6-C7: Mild disc bulge with uncovertebral hypertrophy. No significant spinal stenosis. Moderate right C7 foraminal  stenosis. No significant left foraminal narrowing. C7-T1:  Unremarkable. Visualized upper thoracic spine demonstrates no significant finding. IMPRESSION: MRI HEAD IMPRESSION: 1. 8 mm acute ischemic left basal ganglia infarct. Associated petechial hemorrhage without frank hemorrhagic transformation. 2. Additional small volume subacute ischemic changes involving the high right frontal centrum semi ovale and left cerebellum as above. 3. Underlying severe chronic microvascular ischemic disease with multiple remote lacunar infarcts involving the deep gray nuclei and pons. 4. Multiple scattered chronic micro hemorrhages involving both cerebral and cerebellar hemispheres, most pronounced about the deep gray nuclei. Changes related to chronic poorly controlled hypertension is favored. Cerebral amyloid would be the primary differential consideration. MRI CERVICAL SPINE IMPRESSION: 1. Degenerative disc osteophyte at C4-5 with resultant moderate spinal stenosis, with moderate left worse than right C5 foraminal narrowing. Finding could contribute to left-sided radicular symptoms. 2. Disc bulge with uncovertebral spurring at C5-6 and C6-7 with resultant moderate to severe right C6 and C7 foraminal stenosis. 3. Broad-based central disc protrusion at C3-4 with resultant mild spinal stenosis. Electronically Signed   By: Jeannine Boga M.D.   On: 09/20/2019 22:41   MR Cervical Spine Wo Contrast  Result Date: 09/20/2019 CLINICAL DATA:  Initial evaluation for acute left upper extremity weakness. EXAM: MRI HEAD WITHOUT CONTRAST MRI CERVICAL SPINE WITHOUT CONTRAST TECHNIQUE: Multiplanar, multiecho pulse sequences of the brain and surrounding structures, and cervical spine, to include the craniocervical junction and cervicothoracic junction, were obtained without intravenous contrast. COMPARISON:  Comparison made with prior CT from earlier the same day. FINDINGS: MRI HEAD FINDINGS Brain: Generalized age-related cerebral  atrophy. Extensive chronic microvascular ischemic disease with multiple remote lacunar infarcts seen clustered about the bilateral basal ganglia and thalami. Patchy involvement of the pons noted as well. 8 mm focus of restricted diffusion involving the left caudate consistent with an acute ischemic infarct (series 5, image 8). Associated susceptibility artifact suggests petechial hemorrhage without frank hemorrhagic transformation. No additional punctate 4 mm cortical infarct noted at the left occipital lobe (a series 5, image 76). No associated hemorrhage or mass effect. Additional faint subcentimeter diffusion abnormality at the high right frontal centrum semi ovale consistent with subacute small vessel ischemia. Mild residual diffusion abnormality seen about a largely chronic appearing lacunar infarct at the  posterior right basal ganglia. Additional 12 mm vague focus of diffusion abnormality involving the left cerebellum consistent with a subacute left cerebellar infarct. No associated hemorrhage or mass effect. Multiple scattered chronic micro hemorrhages seen involving both cerebral and cerebellar hemispheres, most notable about the deep gray nuclei, favored to be related to chronic poorly controlled hypertension. Sequelae of cerebral amyloid would be the primary differential consideration. No mass lesion, midline shift or mass effect. No hydrocephalus. No extra-axial fluid collection. Pituitary gland suprasellar region normal. Midline structures intact. Vascular: Major intracranial vascular flow voids are maintained. Skull and upper cervical spine: Craniocervical junction within normal limits. Bone marrow signal intensity normal. No scalp soft tissue abnormality. Sinuses/Orbits: Globes and orbital soft tissues within normal limits. Paranasal sinuses are largely clear. No significant mastoid effusion. Other: None. MRI CERVICAL SPINE FINDINGS Alignment: Vertebral bodies normally aligned with preservation of the  normal cervical lordosis. No listhesis. Vertebrae: Vertebral body height maintained without evidence for acute or chronic fracture. Bone marrow signal intensity somewhat heterogeneous but within normal limits. No discrete or worrisome osseous lesions. No abnormal marrow edema. Cord: Signal intensity within the cervical spinal cord is within normal limits. Posterior Fossa, vertebral arteries, paraspinal tissues: Craniocervical junction within normal limits. Paraspinous and prevertebral soft tissues normal. Normal flow voids seen within the vertebral arteries bilaterally. Disc levels: C2-C3: Unremarkable. C3-C4: Broad central disc protrusion indents the ventral thecal sac, partially effacing the ventral CSF. Mild spinal stenosis without cord deformity. Superimposed uncovertebral hypertrophy without significant foraminal encroachment. C4-C5: Diffuse degenerative disc osteophyte with bilateral uncovertebral hypertrophy. Broad posterior component flattens and effaces the ventral thecal sac, greater on the left. Superimposed mild facet and ligament flavum hypertrophy. Resultant moderate spinal stenosis with mild cord flattening. Left greater than right uncinate spurring with resultant moderate left worse than right C5 foraminal stenosis. C5-C6: Chronic intervertebral disc space narrowing with diffuse degenerative disc osteophyte. Broad posterior component flattens and partially faces the ventral thecal sac resultant mild spinal stenosis. No cord deformity. Severe right with mild left C6 foraminal stenosis. C6-C7: Mild disc bulge with uncovertebral hypertrophy. No significant spinal stenosis. Moderate right C7 foraminal stenosis. No significant left foraminal narrowing. C7-T1:  Unremarkable. Visualized upper thoracic spine demonstrates no significant finding. IMPRESSION: MRI HEAD IMPRESSION: 1. 8 mm acute ischemic left basal ganglia infarct. Associated petechial hemorrhage without frank hemorrhagic transformation. 2.  Additional small volume subacute ischemic changes involving the high right frontal centrum semi ovale and left cerebellum as above. 3. Underlying severe chronic microvascular ischemic disease with multiple remote lacunar infarcts involving the deep gray nuclei and pons. 4. Multiple scattered chronic micro hemorrhages involving both cerebral and cerebellar hemispheres, most pronounced about the deep gray nuclei. Changes related to chronic poorly controlled hypertension is favored. Cerebral amyloid would be the primary differential consideration. MRI CERVICAL SPINE IMPRESSION: 1. Degenerative disc osteophyte at C4-5 with resultant moderate spinal stenosis, with moderate left worse than right C5 foraminal narrowing. Finding could contribute to left-sided radicular symptoms. 2. Disc bulge with uncovertebral spurring at C5-6 and C6-7 with resultant moderate to severe right C6 and C7 foraminal stenosis. 3. Broad-based central disc protrusion at C3-4 with resultant mild spinal stenosis. Electronically Signed   By: Jeannine Boga M.D.   On: 09/20/2019 22:41   DG CHEST PORT 1 VIEW  Result Date: 09/21/2019 CLINICAL DATA:  Left hand paralysis weakness EXAM: PORTABLE CHEST 1 VIEW COMPARISON:  09/11/2019, 04/19/2019, CT chest 04/21/2019 FINDINGS: Diffuse bilateral reticular and ground-glass opacity. Stable cardiomediastinal silhouette. No pleural effusion or pneumothorax.  IMPRESSION: Interval diffuse bilateral interstitial and ground-glass opacity some of which is due to chronic change though suspect acute superimposed process such as possible atypical or viral pneumonia. Electronically Signed   By: Donavan Foil M.D.   On: 09/21/2019 00:34    PHYSICAL EXAM  Temp:  [97.8 F (36.6 C)-98.1 F (36.7 C)] 98.1 F (36.7 C) (05/19 0515) Pulse Rate:  [69-84] 69 (05/19 1400) Resp:  [14-25] 18 (05/19 1400) BP: (122-179)/(57-134) 135/68 (05/19 1400) SpO2:  [91 %-99 %] 94 % (05/19 1400)  General - Cachectic, well  developed, lethargic.  Ophthalmologic - fundi not visualized due to noncooperation.  Cardiovascular - Regular rhythm and rate.  Mental Status -  Level of arousal and orientation to time, place, and person were intact. Language including expression, naming, repetition, comprehension was assessed and found intact. Mild dysarthria  Cranial Nerves II - XII - II - Visual field intact OU. III, IV, VI - Extraocular movements intact. V - Facial sensation intact bilaterally. VII - Facial movement intact bilaterally. VIII - Hearing & vestibular intact bilaterally. X - Palate elevates symmetrically. XI - Chin turning & shoulder shrug intact bilaterally. XII - Tongue protrusion intact.  Motor Strength - The patient's strength was normal in RUE and RLE, however, LUE proximal 4/5 but distal with wrist and finger movement 0/5. LLE 4/5 proximal and 2/5 ankle and toe DF/PF.  Bulk was decreased and fasciculations were absent.   Motor Tone - Muscle tone was assessed at the neck and appendages and was normal.  Reflexes - The patient's reflexes were symmetrica 1+l in all extremities and she had no pathological reflexes.  Sensory - Light touch, temperature/pinprick were assessed and were symmetrical except bilateral anterior hand decreased sensation, L>R.    Coordination - The patient had normal movements in the hands with no ataxia or dysmetria, but slow bilaterally.  Tremor was absent.  Gait and Station - deferred.   ASSESSMENT/PLAN Ms. Patricia Stanley is a 74 y.o. female with history of CVA in December 2020 with residual left-sided deficits, HLD, HTN, carotid artery stenosis status post right CEA 04/2019, failure to thrive and anemia, discharged from [facility] last Friday after admission for persistent diarrhea, worsening weakness, anemia and severe deconditioning, who presents today via EMS with worsening of her left sided weakness.    Stroke:  Multifocal punctate anterior and  posterior infarcts, embolic pattern, source unclear  CT head No acute abnormality. Small focal L cerebellar hypodensity. Small vessel disease. Atrophy. Multiple old B lacunes.   MRI  L CR infarct w/ petechial hemorrhage, R high frontal SO, L cerebellar and left PCA territory punctate infarcts. Small vessel disease. Atrophy. Old lacunes white matter and pons. Multiple scattered microhemorrhages.  CTA head & neck R CEA patent. Mild stenosis proximal L CCA. Moderate L V4 stenosis. Moderate B MCA stenosis.  2D Echo pending  LE venous doppler pending  LDL 28  HgbA1c 5.7  D-dimer 14.59  SCDs for VTE prophylaxis  aspirin 325 mg daily prior to admission, now on aspirin 325 mg daily. No DAPT due to severe anemia needing PRBC  Therapy recommendations:  pending   Disposition:  pending   Weight loss and FTT  Has lost about 15 to 20 pound for the last 6 months  Husband considered chronic diarrhea is the cause -5/9-5/14 admitted for diarrhea, anemia and FTT - improving now  CTA chest did not show malignancy but enlarged lymph nodes likely reactive.   04/2019 CT abdomen pelvis  negative for malignancy  04/2019 CT chest showed diffuse wall thickening of the lower third of the esophagus recommend outpatient EGD.  Recent chronic diarrhea, now improved.  Has not follow-up with GI since last discharge 04/2019  We will repeat CT abdomen pelvis in a.m.  Recommend GI consult  Anemia  Likely due to nutritional  Hemoglobin 7.6-7.2-7.1-PRBC-8.8  Close monitoring  B12 pending  Iron panel pending  Occult blood pending  Leukocytosis  WBC 11.7-12.8-10.8-17.0  ESR 29, and CRP 19.8  Ferritin 1206  Autoimmune labs pending  Chest CT - bilateral pneumonia, pleural effusion, interstitial pneumonitis  Hx stroke/TIA  04/2019 - right corona radiata infarct secondary to small vessel disease in setting of large vessel disease source.  EF 60 to 65%.  LDL 87 and A1c 5.8.  Carotid  Doppler right ICA 40 to 59% stenosis.  CTA head and neck right ICA plaque 65 to 70% stenosis.  DAPT x 3 mos then aspirin alone. R ICA stenosis->R CEA 04/2019.  Hypertension  Variable, up to 175/134 . Permissive hypertension (OK if <180/105) but gradually normalize in 3-5 days . Long-term BP goal normotensive  Hyperlipidemia  Home meds:  lipitor 80, resumed in hospital  LDL 28, goal < 70  Decrease Lipitor to 40  Continue statin at discharge  Other Stroke Risk Factors  Advanced age  Former Cigarette smoker, quit 24y ago  Migraines  Chronic diastolic CHF  Other Active Problems  FTT  Major depression  Malnutrition of moderate degree  Symptomatic anemia  Generalized body aches  Bronchiectasis vs atypical PNA  Hospital day # 0  I discussed with Dr. Thereasa Solo. I spent  35 minutes in total face-to-face time with the patient, more than 50% of which was spent in counseling and coordination of care, reviewing test results, images and medication, and discussing the diagnosis of persistent and intentional weight loss, recurrent stroke, embolic stroke, anemia, leukocytosis, elevated inflammation marker, failure to thrive treatment plan and potential prognosis. This patient's care requiresreview of multiple databases, neurological assessment, discussion with family, other specialists and medical decision making of high complexity. I had long discussion with patient and husband at bedside, updated pt current condition, treatment plan and potential prognosis, and answered all the questions.  They expressed understanding and appreciation.   Rosalin Hawking, MD PhD Stroke Neurology 09/21/2019 8:50 PM  To contact Stroke Continuity provider, please refer to [email or web address] After hours, contact General Neurology

## 2019-09-21 NOTE — ED Notes (Signed)
Patient cleaned up and sheets changed at this time; pt. purewick changed and warm blankets provided at this time

## 2019-09-22 ENCOUNTER — Inpatient Hospital Stay (HOSPITAL_COMMUNITY): Payer: Medicare HMO

## 2019-09-22 DIAGNOSIS — R634 Abnormal weight loss: Secondary | ICD-10-CM

## 2019-09-22 DIAGNOSIS — Z8673 Personal history of transient ischemic attack (TIA), and cerebral infarction without residual deficits: Secondary | ICD-10-CM

## 2019-09-22 DIAGNOSIS — I639 Cerebral infarction, unspecified: Secondary | ICD-10-CM

## 2019-09-22 DIAGNOSIS — J8489 Other specified interstitial pulmonary diseases: Secondary | ICD-10-CM

## 2019-09-22 DIAGNOSIS — D72829 Elevated white blood cell count, unspecified: Secondary | ICD-10-CM

## 2019-09-22 DIAGNOSIS — I340 Nonrheumatic mitral (valve) insufficiency: Secondary | ICD-10-CM

## 2019-09-22 LAB — TYPE AND SCREEN
ABO/RH(D): A POS
Antibody Screen: NEGATIVE
Unit division: 0

## 2019-09-22 LAB — CBC
HCT: 25.8 % — ABNORMAL LOW (ref 36.0–46.0)
Hemoglobin: 8.2 g/dL — ABNORMAL LOW (ref 12.0–15.0)
MCH: 28.1 pg (ref 26.0–34.0)
MCHC: 31.8 g/dL (ref 30.0–36.0)
MCV: 88.4 fL (ref 80.0–100.0)
Platelets: 274 10*3/uL (ref 150–400)
RBC: 2.92 MIL/uL — ABNORMAL LOW (ref 3.87–5.11)
RDW: 18.3 % — ABNORMAL HIGH (ref 11.5–15.5)
WBC: 13.5 10*3/uL — ABNORMAL HIGH (ref 4.0–10.5)
nRBC: 0.3 % — ABNORMAL HIGH (ref 0.0–0.2)

## 2019-09-22 LAB — ECHOCARDIOGRAM COMPLETE

## 2019-09-22 LAB — COMPREHENSIVE METABOLIC PANEL
ALT: 27 U/L (ref 0–44)
AST: 25 U/L (ref 15–41)
Albumin: 1.4 g/dL — ABNORMAL LOW (ref 3.5–5.0)
Alkaline Phosphatase: 96 U/L (ref 38–126)
Anion gap: 8 (ref 5–15)
BUN: 21 mg/dL (ref 8–23)
CO2: 21 mmol/L — ABNORMAL LOW (ref 22–32)
Calcium: 7.8 mg/dL — ABNORMAL LOW (ref 8.9–10.3)
Chloride: 111 mmol/L (ref 98–111)
Creatinine, Ser: 1.4 mg/dL — ABNORMAL HIGH (ref 0.44–1.00)
GFR calc Af Amer: 43 mL/min — ABNORMAL LOW (ref >60)
GFR calc non Af Amer: 37 mL/min — ABNORMAL LOW (ref >60)
Glucose, Bld: 104 mg/dL — ABNORMAL HIGH (ref 70–99)
Potassium: 4.6 mmol/L (ref 3.5–5.1)
Sodium: 140 mmol/L (ref 135–145)
Total Bilirubin: 0.8 mg/dL (ref 0.3–1.2)
Total Protein: 4.8 g/dL — ABNORMAL LOW (ref 6.5–8.1)

## 2019-09-22 LAB — IRON AND TIBC
Iron: 21 ug/dL — ABNORMAL LOW (ref 28–170)
Saturation Ratios: 16 % (ref 10.4–31.8)
TIBC: 134 ug/dL — ABNORMAL LOW (ref 250–450)
UIBC: 113 ug/dL

## 2019-09-22 LAB — VITAMIN B12: Vitamin B-12: 471 pg/mL (ref 180–914)

## 2019-09-22 LAB — FERRITIN: Ferritin: 1056 ng/mL — ABNORMAL HIGH (ref 11–307)

## 2019-09-22 LAB — RETICULOCYTES
Immature Retic Fract: 46.2 % — ABNORMAL HIGH (ref 2.3–15.9)
RBC.: 2.96 MIL/uL — ABNORMAL LOW (ref 3.87–5.11)
Retic Count, Absolute: 124.3 10*3/uL (ref 19.0–186.0)
Retic Ct Pct: 4.2 % — ABNORMAL HIGH (ref 0.4–3.1)

## 2019-09-22 LAB — HEMOGLOBIN A1C
Hgb A1c MFr Bld: 5.3 % (ref 4.8–5.6)
Mean Plasma Glucose: 105 mg/dL

## 2019-09-22 LAB — BPAM RBC
Blood Product Expiration Date: 202105252359
ISSUE DATE / TIME: 202105190231
Unit Type and Rh: 6200

## 2019-09-22 LAB — MAGNESIUM: Magnesium: 1.9 mg/dL (ref 1.7–2.4)

## 2019-09-22 LAB — GLUCOSE, CAPILLARY: Glucose-Capillary: 98 mg/dL (ref 70–99)

## 2019-09-22 LAB — FOLATE: Folate: 4.1 ng/mL — ABNORMAL LOW (ref >5.9)

## 2019-09-22 MED ORDER — CHOLESTYRAMINE 4 G PO PACK
4.0000 g | PACK | Freq: Two times a day (BID) | ORAL | Status: DC
  Administered 2019-09-22: 4 g via ORAL
  Filled 2019-09-22 (×2): qty 1

## 2019-09-22 MED ORDER — FOLIC ACID 1 MG PO TABS
1.0000 mg | ORAL_TABLET | Freq: Every day | ORAL | Status: DC
  Administered 2019-09-22 – 2019-09-27 (×6): 1 mg via ORAL
  Filled 2019-09-22 (×6): qty 1

## 2019-09-22 MED ORDER — ENSURE ENLIVE PO LIQD
237.0000 mL | Freq: Three times a day (TID) | ORAL | Status: DC
  Administered 2019-09-22 – 2019-09-26 (×13): 237 mL via ORAL

## 2019-09-22 MED ORDER — ADULT MULTIVITAMIN W/MINERALS CH
1.0000 | ORAL_TABLET | Freq: Every day | ORAL | Status: DC
  Administered 2019-09-22 – 2019-09-27 (×6): 1 via ORAL
  Filled 2019-09-22 (×6): qty 1

## 2019-09-22 MED ORDER — FOLIC ACID 5 MG/ML IJ SOLN
1.0000 mg | Freq: Every day | INTRAMUSCULAR | Status: DC
  Administered 2019-09-22: 1 mg via INTRAVENOUS
  Filled 2019-09-22: qty 0.2

## 2019-09-22 MED ORDER — SODIUM CHLORIDE 0.9 % IV SOLN: INTRAVENOUS | Status: DC

## 2019-09-22 MED ORDER — CHOLESTYRAMINE 4 G PO PACK: 2.0000 g | PACK | Freq: Two times a day (BID) | ORAL | Status: DC

## 2019-09-22 MED ORDER — PRO-STAT SUGAR FREE PO LIQD
30.0000 mL | Freq: Two times a day (BID) | ORAL | Status: DC
  Administered 2019-09-22 – 2019-09-27 (×10): 30 mL via ORAL
  Filled 2019-09-22 (×10): qty 30

## 2019-09-22 NOTE — Progress Notes (Signed)
Initial Nutrition Assessment  DOCUMENTATION CODES:   Non-severe (moderate) malnutrition in context of chronic illness  INTERVENTION:   Please obtain updated weight.   -Ensure Enlive po TID, each supplement provides 350 kcal and 20 grams of protein -MVI daily -Magic cup TID with meals, each supplement provides 290 kcal and 9 grams of protein -1ml Pro-stat BID, each supplement provides 100 kcal and 15 grams of protein  NUTRITION DIAGNOSIS:   Moderate Malnutrition related to chronic illness(CVA) as evidenced by energy intake < or equal to 75% for > or equal to 1 month, mild muscle depletion, mild fat depletion, moderate fat depletion, moderate muscle depletion.    GOAL:   Patient will meet greater than or equal to 90% of their needs    MONITOR:   PO intake, Supplement acceptance, Weight trends, Labs, I & O's  REASON FOR ASSESSMENT:   Consult Assessment of nutrition requirement/status  ASSESSMENT:   Pt with a PMH significant for CVA (04/2019), HLD, HTN, carotid artery stenosis s/p R CEA, FTT, anemia, and recent hospitalization for weakness and diarrhea. Pt now presents with worsening of her L-sided weakness.  Pt's husband reports that pt has had left-sided residual weakness since her last stroke in 04/2019 but it has recently gotten worse. Pt's husband states that the pt has never been much of an eater. He reports that she typically has an egg sandwich or oatmeal anywhere from 12-2pm and then has something like a Power Bowl form [employer] later in the evening. Pt's husband states pt drinks Ensures on occasion.   Per H&P, pt's husband reports pt had a 15-20 lb wt loss over the last 6 months; however, this is not reflected in the available wt readings. Per wt readings, pt was 41.5 kg on 04/21/2019 and her most recent weight on 09/16/2019 was 48.8 kg. Suspect this wt reading is not accurate as RD documented an 11% wt loss x6 months during last hospital admission (see RD note on  09/12/2019). Pt's husband feels that the diarrhea pt had (which lead to previous admission) was a driving factor in the pt's wt loss, in addition to the weakness/lethargy following her stroke.    No PO intake documented. Pt's husband feels that the pt will do better with supplements she can drink, such as Ensure and Pro-stat. Pt's husband also agreeable to trying [employer].   Labs reviewed. Medications reviewed and include: Ferrous sulfate, creon, remeron, zofran  NUTRITION - FOCUSED PHYSICAL EXAM:    Most Recent Value  Orbital Region  Mild depletion  Upper Arm Region  Moderate depletion  Thoracic and Lumbar Region  Mild depletion  Buccal Region  Mild depletion  Temple Region  Moderate depletion  Clavicle Bone Region  Mild depletion  Clavicle and Acromion Bone Region  Mild depletion  Scapular Bone Region  Mild depletion  Dorsal Hand  Mild depletion  Patellar Region  Moderate depletion  Anterior Thigh Region  Moderate depletion  Posterior Calf Region  Moderate depletion  Edema (RD Assessment)  None  Hair  Reviewed  Eyes  Reviewed  Mouth  Reviewed  Skin  Reviewed  Nails  Reviewed       Diet Order:   Diet Order            Diet regular Room service appropriate? Yes; Fluid consistency: Thin  Diet effective now              EDUCATION NEEDS:   No education needs have been identified at this time  Skin:  Skin Assessment: Reviewed RN Assessment  Last BM:  PTA  Height:   Ht Readings from Last 1 Encounters:  09/11/19 5\' 1"  (1.549 m)    Weight:   Wt Readings from Last 7 Encounters:  09/16/19 48.8 kg  05/24/19 43.9 kg  05/20/19 45.4 kg  05/18/19 44.5 kg  05/09/19 43.7 kg  04/21/19 41.5 kg  07/19/17 59 kg    BMI:  There is no height or weight on file to calculate BMI.  Estimated Nutritional Needs:   Kcal:  1400-1600  Protein:  70-85 grams  Fluid:  >1.4L/d    07/21/17, MS, RD, LDN RD pager number and weekend/on-call pager number located in  Amion.

## 2019-09-22 NOTE — Progress Notes (Signed)
Patricia Stanley  JOI:[id number] DOB: Sep 19, 1945 DOA: 09/20/2019 PCP: Marden Noble, MD    Brief Narrative:  74 year old with history of CVA December 2020, carotid artery stenosis status post right CEA December 2020, depression, GERD, kidney stones, HTN, chronic diarrhea, and chronic anemia who presented to the ED with a left arm numbness and left leg weakness for a few days.  This represents a worsening of her chronic symptoms dating back to December 2020.  The patient has recently undergone an evaluation for failure to thrive and was found to have chronic diarrhea with GI initiating Creon, cholestyramine, and Imodium.  She was felt to be too frail for endoscopic evaluation at that time.  Significant Events: 04/2019 hospitalized for R MCA CVA 5/9 > 5/14 admit to [facility] w/ FTT - ongoing diarrhea, weight loss, FTT 5/18 admit via [facility] ED w/ L arm and leg weakness  5/20 venous duplex w/o evidence of DVT 5/20 TTE -EF 55-60% with no WMA -RV moderately enlarged with moderately elevated PA systolic pressure at 45 -LA moderately dilated -RA mildly dilated -moderate pulmonic regurg  Antimicrobials:  None  Subjective: Appears to be resting comfortably in bed but tells me that she has significant pain in her left hand left arm and left leg.  Denies shortness of breath or chest pain.  Tells me she has very poor appetite with that this is not new.  Tells me she has "been feeling bad" for many months now.  Assessment & Plan:  Multifocal punctate anterior and posterior infarcts, embolic pattern MRI noted L CR infarct w/ petechial hemorrhage, R high frontal SO, L cerebellar and left PCA territory punctate infarcts, small vessel disease, atrophy, and old lacunes white matter and pons w/ multiple scattered microhemorrhages - CTa head/neck noted patent R CEA and mild stenosis prox L CCA - LDL 28 - A1c 5.7 - ASA 325mg  only for now (no DAPT due to anemia requiring tx)  Acute hypoxic respiratory failure -  bronchiectasis versus atypical pneumonia Patient requiring 2 L nasal cannula to keep sats greater than 85% -lung hyperexpansion and chronic bronchitic changes noted on CXR December 2020 -suggestion of probable chronic interstitial lung disease via CT chest December 2020 - CTa this admit notes what appears to be interstitial pulmonary disease -for now we will evaluate her neurologic symptoms but I suspect she will require home oxygen and will need referral to the pulmonary office for more extensive evaluation of what appears to be indolent interstitial lung disease  Chronic normocytic anemia  Hemoglobin 7.1 at presentation but was 7.2 Sep 16, 2019 -hemoglobin improved as expected with transfusion of 1 unit PRBC this admission -suspect this is anemia of chronic disease -Albumin is profoundly low as is TIBC -accordingly iron levels are low as well -B12 is acceptable but folate is actually below goal -supplement with folic acid  Acute kidney injury crt has increased significantly since admission - avoid IV contrast - follow  Unintentional Weight Loss / FTT Has lost ~20# over 6 months - repeat CT abdom/pelvis postponed due to worsening renal function - reassess in AM  Significantly elevated D-dimer 14.6 at time of presentation - CTa w/o evidence of PE - COVID testing negative - venous duplex bilateral lower extremities without evidence of DVT  Possible esophageal thickening Noted on CT chest December 2020 with recommendation for outpatient EGD - barium esophagram December 2020 without evidence of esophageal leak or esophageal mass  Slightly dilated pancreatic and common bile ducts Noted per CT chest/abdomen December 2020  with recommendation for outpatient MRCP or ERCP  Chronic diarrhea  Was evaluated by [facility] May 2021 - placed on pancrease 36K U TID, cholestyramine 4g BID, and prn imodium at that time - outpt f/u in 2-3 months was the D/C recommendation   Equivocal UA UA is remarkable for a  large number of white blood cells as well as leukocytes but pt w/ no specific UTI sx - follow w/o abx for now   Hypomagnesemia Magnesium 1.2 at presentation -corrected to goal with supplementation  Major depression Per Dr. [place] "was seen by psychiatry on 09/14/2019; Prozac was changed to mirtazapine to assist with appetite stimulation.  Did recommend outpatient provider to consider tapering of clonazepam to decrease symptoms of weakness and fatigue"  HTN Avoid overaggressive correction of blood pressure at this time  HLD Continue home medical therapy  Malnutrition of moderate degree   DVT prophylaxis: SCDs Code Status: NO CODE BLUE Family Communication: No family present at time of exam today Status is: Inpatient  Remains inpatient appropriate because:Ongoing diagnostic testing needed not appropriate for outpatient work up   Dispo: The patient is from: Home              Anticipated d/c is to: [facility] at this time              Anticipated d/c date is: 2 days              Patient currently is not medically stable to d/c.   Consultants:  Stroke team  Objective: Blood pressure (!) 162/74, pulse 73, temperature 98.9 F (37.2 C), temperature source Oral, resp. rate 18, SpO2 96 %.  Intake/Output Summary (Last 24 hours) at 09/22/2019 0719 Last data filed at 09/21/2019 1817 Gross per 24 hour  Intake 100 ml  Output --  Net 100 ml   There were no vitals filed for this visit.  Examination: General: No acute respiratory distress -cachectic -weak appearing Lungs: Fine diffuse crackles bilaterally without change Cardiovascular: Regular rate and rhythm  Abdomen: Thin, soft, bowel sounds positive, no rebound Extremities: No significant edema bilateral lower extremities  CBC: Recent Labs  Lab 09/20/19 2013 09/21/19 0821 09/22/19 0204  WBC 10.6* 17.0* 13.5*  NEUTROABS 8.2*  --   --   HGB 7.1* 8.8* 8.2*  HCT 23.5* 28.5* 25.8*  MCV 92.9 90.5 88.4  PLT 313 282 274   Basic  Metabolic Panel: Recent Labs  Lab 09/16/19 0248 09/20/19 2013 09/21/19 0226 09/22/19 0204  NA 136 141  --  140  K 3.9 4.7  --  4.6  CL 106 113*  --  111  CO2 24 22  --  21*  GLUCOSE 99 95  --  104*  BUN 13 22  --  21  CREATININE 0.72 1.00  --  1.40*  CALCIUM 7.8* 8.8*  --  7.8*  MG 2.2  --  1.2* 1.9  PHOS  --   --  2.7  --    GFR: Estimated Creatinine Clearance: 27 mL/min (A) (by C-G formula based on SCr of 1.4 mg/dL (H)).  Liver Function Tests: Recent Labs  Lab 09/21/19 0115 09/22/19 0204  AST 43* 25  ALT 39 27  ALKPHOS 94 96  BILITOT 0.9 0.8  PROT 5.3* 4.8*  ALBUMIN 1.6* 1.4*    Coagulation Profile: Recent Labs  Lab 09/20/19 2013  INR 1.2    Cardiac Enzymes: Recent Labs  Lab 09/21/19 0226  CKTOTAL 40    HbA1C: Hgb A1c MFr  Bld  Date/Time Value Ref Range Status  09/12/2019 03:47 AM 5.7 (H) 4.8 - 5.6 % Final    Comment:    (NOTE) Pre diabetes:          5.7%-6.4% Diabetes:              >6.4% Glycemic control for   <7.0% adults with diabetes   04/19/2019 04:14 AM 5.8 (H) 4.8 - 5.6 % Final    Comment:    (NOTE) Pre diabetes:          5.7%-6.4% Diabetes:              >6.4% Glycemic control for   <7.0% adults with diabetes     CBG: Recent Labs  Lab 09/20/19 1936  GLUCAP 93    Recent Results (from the past 240 hour(s))  C Difficile Quick Screen w PCR reflex     Status: None   Collection Time: 09/13/19 11:50 AM   Specimen: STOOL  Result Value Ref Range Status   C Diff antigen NEGATIVE NEGATIVE Final   C Diff toxin NEGATIVE NEGATIVE Final   C Diff interpretation No C. difficile detected.  Final    Comment: Performed at [facility], [address], [place], [place] [address]  SARS Coronavirus 2 by RT PCR (hospital order, performed in [facility] hospital lab) Nasopharyngeal Nasopharyngeal Swab     Status: None   Collection Time: 09/21/19  1:36 AM   Specimen: Nasopharyngeal Swab  Result Value Ref Range Status   SARS Coronavirus 2  NEGATIVE NEGATIVE Final    Comment: (NOTE) SARS-CoV-2 target nucleic acids are NOT DETECTED. The SARS-CoV-2 RNA is generally detectable in upper and lower respiratory specimens during the acute phase of infection. The lowest concentration of SARS-CoV-2 viral copies this assay can detect is 250 copies / mL. A negative result does not preclude SARS-CoV-2 infection and should not be used as the sole basis for treatment or other patient management decisions.  A negative result may occur with improper specimen collection / handling, submission of specimen other than nasopharyngeal swab, presence of viral mutation(s) within the areas targeted by this assay, and inadequate number of viral copies (<250 copies / mL). A negative result must be combined with clinical observations, patient history, and epidemiological information. Fact Sheet for Patients:   BoilerBrush.com.cy Fact Sheet for Healthcare Providers: https://pope.com/ This test is not yet approved or cleared  by the [place] FDA and has been authorized for detection and/or diagnosis of SARS-CoV-2 by FDA under an Emergency Use Authorization (EUA).  This EUA will remain in effect (meaning this test can be used) for the duration of the COVID-19 declaration under Section 564(b)(1) of the Act, 21 U.S.C. section 360bbb-3(b)(1), unless the authorization is terminated or revoked sooner. Performed at [facility], [address], [place], [place] [address]      Scheduled Meds: .  stroke: mapping our early stages of recovery book   Does not apply Once  . aspirin  325 mg Oral Daily  . atorvastatin  40 mg Oral q1800  . clonazePAM  1 mg Oral BID  . ferrous sulfate  325 mg Oral BID WC  . lipase/protease/amylase  36,000 Units Oral TID WC  . mirtazapine  15 mg Oral QHS  . ondansetron  4 mg Oral TID AC  . pantoprazole  40 mg Oral BID AC   Continuous Infusions: . sodium chloride  Stopped (09/21/19 2233)     LOS: 1 day  Cherene Altes, MD [facility] Office  [phone] Pager - Text Page per Amion  If 7PM-7AM, please contact night-coverage per Amion 09/22/2019, 7:19 AM

## 2019-09-22 NOTE — Consult Note (Signed)
Physical Medicine and Rehabilitation Consult Reason for Consult: Left side weakness and numbness Referring Physician: [facility]   HPI: Patricia Stanley is a 74 y.o. 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  Per chart review patient lives with spouse.  1 level home 6 steps to entry.  Ambulates with a rolling walker but has needed some additional support over the past month due to failure to thrive.  Presented 09/20/2019 with increased left arm weakness numbness as well as left leg weakness.  Cranial CT scan negative for intracranial hemorrhage or territorial infarct.  Small focal hypodensity in the left cerebellum reflecting possible indeterminate lacunar infarction.  Patient did not receive TPA.  MRI showed an 8 mm acute ischemic left basal ganglia infarction.  Associated petechial hemorrhage without frank hemorrhagic transformation.  Multiple scattered chronic microhemorrhages involving both cerebral and cerebellar hemispheres most pronounced about the deep gray nuclei.  MRI cervical spine degenerative disc osteophyte C 4-5 with resultant moderate spinal stenosis.  CT angio of the chest showed no demonstration of pulmonary emboli.  CT angiogram head and neck right carotid enterectomy widely patent.  No significant stenosis in the left carotid bifurcation.  Negative for intracranial large vessel occlusion.  Echocardiogram with ejection fraction of 60% no wall motion abnormalities.  Noted admission chemistries hemoglobin 7.1 as well is noted hemoglobin 7.25/14 WBC 10.6, chloride 113, BUN 22, urinalysis negative nitrite, troponin 33, SARS coronavirus negative, sedimentation rate 29.  Venous Doppler studies negative for DVT.  Currently maintained on aspirin 3 and 25 mg daily for CVA prophylaxis with work-up ongoing.  Therapy evaluations  completed with recommendations of physical medicine rehab consult.  Review of Systems  Constitutional: Positive for malaise/fatigue. Negative for chills and fever.  HENT: Negative for hearing loss.   Eyes: Negative for blurred vision and double vision.  Respiratory: Negative for cough and shortness of breath.   Cardiovascular: Positive for leg swelling. Negative for chest pain and palpitations.  Gastrointestinal: Negative for nausea.       GERD, chronic diarrhea  Genitourinary: Negative for dysuria, flank pain and hematuria.  Musculoskeletal: Positive for joint pain and myalgias.  Skin: Negative for rash.  Neurological: Positive for dizziness and headaches.  All other systems reviewed and are negative.  Past Medical History:  Diagnosis Date  . Closed fracture of coccyx (HCC) 2015   AFTER FALL  . Depression   . GERD (gastroesophageal reflux disease)   . Headache    MIGRAINES OCCASIONAL  . History of kidney stones    HAS NOW  . Hypertension    Past Surgical History:  Procedure Laterality Date  . ABDOMINAL HYSTERECTOMY     1 OVARY LEFT  . APPENDECTOMY  AGE 42   DONE WITH OVARY REMOVAL  . COLONOSCOPY WITH PROPOFOL N/A 12/03/2015   Procedure: COLONOSCOPY WITH PROPOFOL;  Surgeon: Charolett Bumpers, MD;  Location: [facility];  Service: Endoscopy;  Laterality: N/A;  . ENDARTERECTOMY Right 04/21/2019   Procedure: ENDARTERECTOMY CAROTID;  Surgeon: Cephus Shelling, MD;  Location: [facility] OR;  Service: Vascular;  Laterality: Right;  . ESOPHAGOGASTRODUODENOSCOPY (EGD) WITH PROPOFOL N/A 12/03/2015   Procedure: ESOPHAGOGASTRODUODENOSCOPY (EGD) WITH PROPOFOL;  Surgeon: Charolett Bumpers, MD;  Location: [facility];  Service: Endoscopy;  Laterality: N/A;  . TONSILLECTOMY  AGE 49   AND ADENOIDS REMOVED   Family History  Problem Relation Age of Onset  .  Hypertension Mother   . Hyperlipidemia Mother   . Hypertension Father   . Hyperlipidemia Father    Social History:  reports that she  quit smoking about 24 years ago. She has never used smokeless tobacco. She reports that she does not drink alcohol or use drugs. Allergies:  Allergies  Allergen Reactions  . Antihistamines, Chlorpheniramine-Type Other (See Comments)    Makes her heart race  . Paroxetine Other (See Comments)    Shakes-tremors   Medications Prior to Admission  Medication Sig Dispense Refill  . aspirin 325 MG tablet Take 1 tablet (325 mg total) by mouth daily. 30 tablet 0  . atenolol (TENORMIN) 50 MG tablet Take 1 tablet (50 mg total) by mouth every evening. 30 tablet 0  . atorvastatin (LIPITOR) 80 MG tablet Take 1 tablet (80 mg total) by mouth daily at 6 PM. 30 tablet 0  . cholestyramine (QUESTRAN) 4 g packet Take 1 packet (4 g total) by mouth 2 (two) times daily. 180 packet 0  . clonazePAM (KLONOPIN) 1 MG tablet 1 tab every a.m. and 2 tabs nightly (Patient taking differently: Take 1 mg by mouth in the morning and at bedtime. ) 60 tablet 0  . ferrous sulfate 325 (65 FE) MG tablet Take 1 tablet (325 mg total) by mouth 2 (two) times daily with a meal. 180 tablet 0  . lipase/protease/amylase (CREON) 36000 UNITS CPEP capsule Take 1 capsule (36,000 Units total) by mouth 3 (three) times daily with meals. 270 capsule 0  . lisinopril (ZESTRIL) 10 MG tablet Take 1 tablet (10 mg total) by mouth daily. 30 tablet 0  . loperamide (IMODIUM) 2 MG capsule Take 1 capsule (2 mg total) by mouth as needed for diarrhea or loose stools. 30 capsule 0  . mirtazapine (REMERON) 15 MG tablet Take 1 tablet (15 mg total) by mouth at bedtime. 90 tablet 0  . ondansetron (ZOFRAN) 4 MG tablet Take 1 tablet (4 mg total) by mouth 3 (three) times daily before meals. 90 tablet 2  . pantoprazole (PROTONIX) 40 MG tablet Take 1 tablet (40 mg total) by mouth 2 (two) times daily before a meal. 60 tablet 0    Home: Home Living Family/patient expects to be discharged to:: Private residence Living Arrangements: Spouse/significant other,  Children Available Help at Discharge: Family, Available 24 hours/day Type of Home: House Home Access: Stairs to enter [employer] of Steps: 6 Entrance Stairs-Rails: Left, Right Home Layout: One level Bathroom Shower/Tub: Health visitor: Standard Home Equipment: Environmental consultant - 2 wheels, Bedside commode, Shower seat, Grab bars - tub/shower, Hand held shower head  Functional History: Prior Function Level of Independence: Needs assistance Gait / Transfers Assistance Needed: ambulates with RW and has needed additional support last month ADL's / Homemaking Assistance Needed: since her CVA spouse has been providing some assist for ADL Functional Status:  Mobility: Bed Mobility Overal bed mobility: Needs Assistance Bed Mobility: Supine to Sit Rolling: Mod assist Supine to sit: Max assist Sit to supine: Max assist, +2 for physical assistance General bed mobility comments: patient found to be wet in bed, required max assist to get from supine to sitting at edge of bed. unable to maintain sitting balance while changing gown. Posterior leaning with assist needed to attain full upright position. Transfers Overall transfer level: Needs assistance Equipment used: Rolling walker (2 wheeled) Transfers: Sit to/from Stand Sit to Stand: Mod assist, +2 physical assistance Stand pivot transfers: Mod assist, +2 physical assistance General transfer comment: patient unable to  maintain standing balance while being cleaned. Leaning to right and forward. Requiring cues and assistance to stand upright and to pivot to recliner. Unable to keep right UE on walker. Ambulation/Gait Ambulation/Gait assistance: Mod assist, +2 physical assistance Gait Distance (Feet): 4 Feet Assistive device: Rolling walker (2 wheeled) Gait Pattern/deviations: Step-to pattern, Decreased step length - right, Decreased step length - left, Trunk flexed, Staggering right, Narrow base of support General Gait Details:  Patient has hyperextension on left, reports B feet numb. Difficulty maintaining standing balance, requires multimodal cues and physical assist for standing balance. Gait velocity: Decreased    ADL: ADL Overall ADL's : Needs assistance/impaired Eating/Feeding: Set up, Sitting Grooming: Minimal assistance, Sitting Upper Body Bathing: Moderate assistance, Sitting Lower Body Bathing: Maximal assistance, +2 for physical assistance, Sitting/lateral leans, Sit to/from stand Upper Body Dressing : Moderate assistance, Sitting Lower Body Dressing: Maximal assistance, +2 for physical assistance, Sitting/lateral leans, Sit to/from stand Toilet Transfer: Moderate assistance, +2 for physical assistance, Stand-pivot Toilet Transfer Details (indicate cue type and reason): simulated via transfer to/from EOB Toileting- Clothing Manipulation and Hygiene: Maximal assistance, +2 for physical assistance, Sitting/lateral lean, Sit to/from stand Functional mobility during ADLs: Moderate assistance, +2 for physical assistance(HHA) General ADL Comments: pt with notable L side weakness, generalized deconditioning and impaired cognition   Cognition: Cognition Overall Cognitive Status: No family/caregiver present to determine baseline cognitive functioning Orientation Level: Oriented X4 Cognition Arousal/Alertness: Awake/alert, Lethargic Behavior During Therapy: Flat affect Overall Cognitive Status: No family/caregiver present to determine baseline cognitive functioning Area of Impairment: Attention, Memory, Following commands, Awareness, Problem solving Current Attention Level: Sustained Memory: Decreased short-term memory Following Commands: Follows one step commands with increased time, Follows one step commands inconsistently Awareness: Emergent Problem Solving: Slow processing, Requires verbal cues, Decreased initiation, Requires tactile cues General Comments: pt very lethargic initially but once sitting up  more awake/alert. pt slow to respond to questions/commands requiring increased time and cues to do so. Pt also HOH so often requiring repetition   Blood pressure (!) 155/78, pulse 76, temperature 98.1 F (36.7 C), temperature source Oral, resp. rate 18, SpO2 97 %. General: No apparent distress. Cachectic HEENT: Head is normocephalic, atraumatic, PERRLA, EOMI, sclera anicteric, oral mucosa pink and moist, dentition intact, ext ear canals clear,  Neck: Supple without JVD or lymphadenopathy Heart: Reg rate and rhythm. No murmurs rubs or gallops Chest: CTA bilaterally without wheezes, rales, or rhonchi; no distress Abdomen: Soft, non-tender, non-distended, bowel sounds positive. Extremities: No clubbing, cyanosis, or edema. Pulses are 2+ Skin: Clean and intact without signs of breakdown Neuro/MSK: AOx2 (states name is Patricia Stanley). Dysarthria.  RUE/RLE 4+/5 strength LUE: 4-/5 proximally, 0/5 in WE and hand grip. LLE: 4-/5 proximally and 2/5 DF and PF Decreased sensation over left hand.  Psych: Pt's affect is appropriate. Pt is cooperative  Results for orders placed or performed during the hospital encounter of 09/20/19 (from the past 24 hour(s))  Vitamin B12     Status: None   Collection Time: 09/22/19  2:04 AM  Result Value Ref Range   Vitamin B-12 471 180 - 914 pg/mL  Folate     Status: Abnormal   Collection Time: 09/22/19  2:04 AM  Result Value Ref Range   Folate 4.1 (L) >5.9 ng/mL  Iron and TIBC     Status: Abnormal   Collection Time: 09/22/19  2:04 AM  Result Value Ref Range   Iron 21 (L) 28 - 170 ug/dL   TIBC 161 (L) 096 - 045 ug/dL  Saturation Ratios 16 10.4 - 31.8 %   UIBC 113 ug/dL  Ferritin     Status: Abnormal   Collection Time: 09/22/19  2:04 AM  Result Value Ref Range   Ferritin 1,056 (H) 11 - 307 ng/mL  Reticulocytes     Status: Abnormal   Collection Time: 09/22/19  2:04 AM  Result Value Ref Range   Retic Ct Pct 4.2 (H) 0.4 - 3.1 %   RBC. 2.96 (L) 3.87 - 5.11 MIL/uL    Retic Count, Absolute 124.3 19.0 - 186.0 K/uL   Immature Retic Fract 46.2 (H) 2.3 - 15.9 %  Comprehensive metabolic panel     Status: Abnormal   Collection Time: 09/22/19  2:04 AM  Result Value Ref Range   Sodium 140 135 - 145 mmol/L   Potassium 4.6 3.5 - 5.1 mmol/L   Chloride 111 98 - 111 mmol/L   CO2 21 (L) 22 - 32 mmol/L   Glucose, Bld 104 (H) 70 - 99 mg/dL   BUN 21 8 - 23 mg/dL   Creatinine, Ser 4.97 (H) 0.44 - 1.00 mg/dL   Calcium 7.8 (L) 8.9 - 10.3 mg/dL   Total Protein 4.8 (L) 6.5 - 8.1 g/dL   Albumin 1.4 (L) 3.5 - 5.0 g/dL   AST 25 15 - 41 U/L   ALT 27 0 - 44 U/L   Alkaline Phosphatase 96 38 - 126 U/L   Total Bilirubin 0.8 0.3 - 1.2 mg/dL   GFR calc non Af Amer 37 (L) >60 mL/min   GFR calc Af Amer 43 (L) >60 mL/min   Anion gap 8 5 - 15  CBC     Status: Abnormal   Collection Time: 09/22/19  2:04 AM  Result Value Ref Range   WBC 13.5 (H) 4.0 - 10.5 K/uL   RBC 2.92 (L) 3.87 - 5.11 MIL/uL   Hemoglobin 8.2 (L) 12.0 - 15.0 g/dL   HCT 02.6 (L) 37.8 - 58.8 %   MCV 88.4 80.0 - 100.0 fL   MCH 28.1 26.0 - 34.0 pg   MCHC 31.8 30.0 - 36.0 g/dL   RDW 50.2 (H) 77.4 - 12.8 %   Platelets 274 150 - 400 K/uL   nRBC 0.3 (H) 0.0 - 0.2 %  Magnesium     Status: None   Collection Time: 09/22/19  2:04 AM  Result Value Ref Range   Magnesium 1.9 1.7 - 2.4 mg/dL   CT ANGIO HEAD W OR WO CONTRAST  Result Date: 09/21/2019 CLINICAL DATA:  Follow-up stroke.  Left-sided weakness. EXAM: CT ANGIOGRAPHY HEAD AND NECK TECHNIQUE: Multidetector CT imaging of the head and neck was performed using the standard protocol during bolus administration of intravenous contrast. Multiplanar CT image reconstructions and MIPs were obtained to evaluate the vascular anatomy. Carotid stenosis measurements (when applicable) are obtained utilizing NASCET criteria, using the distal internal carotid diameter as the denominator. CONTRAST:  OMNIPAQUE IOHEXOL 350 MG/ML SOLN COMPARISON:  MRI head and CT head 09/20/2019.  CT angio head and neck 04/19/2019 FINDINGS: CTA NECK FINDINGS Aortic arch: Atherosclerotic aortic arch. Mild stenosis origin of left common carotid artery. Atherosclerotic calcification left subclavian artery. Moderate stenosis left subclavian artery distal to the vertebral origin. Right carotid system: Postop right carotid endarterectomy. No significant right carotid stenosis Left carotid system: Mild stenosis at the origin and proximal aspect of the left common carotid artery unchanged. Mild atherosclerotic disease left carotid bifurcation without significant stenosis. Vertebral arteries: Mild stenosis origin of right vertebral artery. No  additional right vertebral artery stenosis to the skull base. Left vertebral artery patent to the skull base without significant stenosis. Skeleton: Mild cervical spine degenerative change. No acute skeletal abnormality. Compression fractures of T5 and T6 appear chronic. Other neck: Negative for mass or adenopathy in the neck. Upper chest: Chest CT from today reported separately. Review of the MIP images confirms the above findings CTA HEAD FINDINGS Anterior circulation: Atherosclerotic calcification in the cavernous carotid bilaterally. Moderate stenosis of the supraclinoid internal carotid artery on the left. Both anterior cerebral arteries are patent without stenosis. Hypoplastic left A1 segment. Moderate stenosis right M1 segment.  Right MCA branches patent. Moderate stenosis mid and distal left M1 segment. Left MCA branches patent with focal moderate stenosis in the sylvian fissure M2 branch unchanged. Posterior circulation: Both vertebral arteries patent to the basilar. PICA patent bilaterally. Moderate stenosis left vertebral artery at the left PICA level. Atherosclerotic disease in the basilar artery with moderate stenosis proximally. This is unchanged. Moderate stenosis of the proximal P2 branch bilaterally. Additional moderate stenosis in the distal PCA bilaterally. No  aneurysm identified as question previously. Venous sinuses: Normal venous enhancement Anatomic variants: None Review of the MIP images confirms the above findings IMPRESSION: 1. Postop right carotid endarterectomy which is widely patent. 2. Mild stenosis proximal left common carotid artery. No significant stenosis in the left carotid bifurcation 3. Both vertebral arteries patent to the basilar with moderate stenosis left V4 segment unchanged. In addition, there is moderate stenosis proximal basilar and moderate stenosis in the posterior cerebral arteries bilaterally. 4. Negative for intracranial large vessel occlusion. 5. Moderate stenosis in the middle cerebral arteries bilaterally unchanged. Electronically Signed   By: Marlan Palau M.D.   On: 09/21/2019 09:59   DG Abd 1 View  Result Date: 09/21/2019 CLINICAL DATA:  Obstipation EXAM: ABDOMEN - 1 VIEW COMPARISON:  09/13/2019 FINDINGS: Nonobstructed gas pattern with mild stool. Gas present in the rectum. Dystrophic gluteal calcifications. Small calcification in the left upper quadrant, felt to correspond to upper pole stone in the kidney. IMPRESSION: Nonobstructed bowel-gas pattern. Electronically Signed   By: Jasmine Pang M.D.   On: 09/21/2019 00:36   CT Head Wo Contrast  Result Date: 09/20/2019 CLINICAL DATA:  Left hand paralysis and numbness EXAM: CT HEAD WITHOUT CONTRAST TECHNIQUE: Contiguous axial images were obtained from the base of the skull through the vertex without intravenous contrast. COMPARISON:  MRI 04/18/2019, CT brain 04/18/2019 FINDINGS: Brain: No acute territorial infarction, hemorrhage or intracranial mass. Atrophy and chronic small vessel ischemic change of the white matter. Multiple bilateral chronic infarcts involving the white matter, basal ganglia and thalamus. Small focal hypodensity within the left cerebellum, not clearly seen on the previous exam and potentially representing age indeterminate lacunar infarct. Stable ventricle  size. Vascular: No hyperdense vessels.  Carotid vascular calcification Skull: Normal. Negative for fracture or focal lesion. Sinuses/Orbits: No acute finding. Other: None IMPRESSION: 1. Negative for intracranial hemorrhage or territorial infarction. 2. Small focal hypodensity in the left cerebellum, may reflect age indeterminate lacunar infarct. 3. Atrophy, chronic small vessel ischemic changes of the white matter and multiple bilateral chronic appearing lacunar infarcts. Electronically Signed   By: Jasmine Pang M.D.   On: 09/20/2019 20:09   CT ANGIO NECK W OR WO CONTRAST  Result Date: 09/21/2019 CLINICAL DATA:  Follow-up stroke.  Left-sided weakness. EXAM: CT ANGIOGRAPHY HEAD AND NECK TECHNIQUE: Multidetector CT imaging of the head and neck was performed using the standard protocol during bolus administration of intravenous contrast.  Multiplanar CT image reconstructions and MIPs were obtained to evaluate the vascular anatomy. Carotid stenosis measurements (when applicable) are obtained utilizing NASCET criteria, using the distal internal carotid diameter as the denominator. CONTRAST:  OMNIPAQUE IOHEXOL 350 MG/ML SOLN COMPARISON:  MRI head and CT head 09/20/2019. CT angio head and neck 04/19/2019 FINDINGS: CTA NECK FINDINGS Aortic arch: Atherosclerotic aortic arch. Mild stenosis origin of left common carotid artery. Atherosclerotic calcification left subclavian artery. Moderate stenosis left subclavian artery distal to the vertebral origin. Right carotid system: Postop right carotid endarterectomy. No significant right carotid stenosis Left carotid system: Mild stenosis at the origin and proximal aspect of the left common carotid artery unchanged. Mild atherosclerotic disease left carotid bifurcation without significant stenosis. Vertebral arteries: Mild stenosis origin of right vertebral artery. No additional right vertebral artery stenosis to the skull base. Left vertebral artery patent to the skull  base without significant stenosis. Skeleton: Mild cervical spine degenerative change. No acute skeletal abnormality. Compression fractures of T5 and T6 appear chronic. Other neck: Negative for mass or adenopathy in the neck. Upper chest: Chest CT from today reported separately. Review of the MIP images confirms the above findings CTA HEAD FINDINGS Anterior circulation: Atherosclerotic calcification in the cavernous carotid bilaterally. Moderate stenosis of the supraclinoid internal carotid artery on the left. Both anterior cerebral arteries are patent without stenosis. Hypoplastic left A1 segment. Moderate stenosis right M1 segment.  Right MCA branches patent. Moderate stenosis mid and distal left M1 segment. Left MCA branches patent with focal moderate stenosis in the sylvian fissure M2 branch unchanged. Posterior circulation: Both vertebral arteries patent to the basilar. PICA patent bilaterally. Moderate stenosis left vertebral artery at the left PICA level. Atherosclerotic disease in the basilar artery with moderate stenosis proximally. This is unchanged. Moderate stenosis of the proximal P2 branch bilaterally. Additional moderate stenosis in the distal PCA bilaterally. No aneurysm identified as question previously. Venous sinuses: Normal venous enhancement Anatomic variants: None Review of the MIP images confirms the above findings IMPRESSION: 1. Postop right carotid endarterectomy which is widely patent. 2. Mild stenosis proximal left common carotid artery. No significant stenosis in the left carotid bifurcation 3. Both vertebral arteries patent to the basilar with moderate stenosis left V4 segment unchanged. In addition, there is moderate stenosis proximal basilar and moderate stenosis in the posterior cerebral arteries bilaterally. 4. Negative for intracranial large vessel occlusion. 5. Moderate stenosis in the middle cerebral arteries bilaterally unchanged. Electronically Signed   By: Marlan Palau M.D.    On: 09/21/2019 09:59   CT ANGIO CHEST PE W OR WO CONTRAST  Result Date: 09/21/2019 CLINICAL DATA:  Shortness of breath EXAM: CT ANGIOGRAPHY CHEST WITH CONTRAST TECHNIQUE: Multidetector CT imaging of the chest was performed using the standard protocol during bolus administration of intravenous contrast. Multiplanar CT image reconstructions and MIPs were obtained to evaluate the vascular anatomy. CONTRAST:  50 mL OMNIPAQUE IOHEXOL 350 MG/ML SOLN COMPARISON:  Chest radiograph Sep 21, 2019; chest CT April 21, 2019 FINDINGS: Cardiovascular: There is no demonstrable pulmonary embolus. There is no thoracic aortic aneurysm or dissection. There are scattered foci of calcification in visualized great vessels. There are foci of aortic atherosclerosis. There are multiple foci of coronary artery calcification. There is no pericardial effusion or pericardial thickening. Mediastinum/Nodes: Thyroid appears unremarkable. There are scattered subcentimeter mediastinal lymph nodes. There are lymph nodes in the subcarinal region, largest measuring 1.2 x 1.2 cm. There are lymph nodes anterior to the carina, largest measuring 1.6 x 1.3 cm. There  is an aortopulmonary window lymph node measuring 1.8 x 1.1 cm. There is a lymph node in the inferior right hilum measuring 1.1 x 1.0 cm. There is fluid in portions of the mid to distal esophagus. Lungs/Pleura: There is a degree of underlying centrilobular emphysematous change there is extensive ground-glass type opacity throughout the lungs bilaterally, most severe in the upper lobes somewhat posteriorly but present to varying degrees throughout the lungs bilaterally. There are moderate free-flowing pleural effusions bilaterally as well. There is a degree of underlying fibrosis, primarily in the lung bases. There is also a degree of lower lobe bronchiectatic change Upper Abdomen: There is reflux of contrast into the inferior vena cava and hepatic veins. Visualized upper abdominal structures  otherwise appear unremarkable. Musculoskeletal: There is slight anterior wedging of several lower thoracic vertebral bodies. There is no appreciable blastic or lytic bone lesion. No evident chest wall lesion. Review of the MIP images confirms the above findings. IMPRESSION: 1. No demonstrable pulmonary embolus. No thoracic aortic aneurysm or dissection. There is aortic atherosclerosis as well as foci of great vessel and coronary artery calcification. 2. Multifocal airspace opacity consistent with pneumonia throughout the lungs bilaterally. Moderate free-flowing pleural effusions. No appreciable consolidation. Question atypical organism pneumonia. In this regard, advise check of COVID-19 status. 3. Underlying degree of centrilobular emphysema. There is a degree of fibrosis, primarily in the lung bases as well as lower lobe bronchiectatic change. There may be a degree of underlying usual interstitial pneumonitis in the presence of emphysematous change. 4. Several enlarged lymph nodes which are of uncertain etiology. These lymph nodes potentially may have reactive etiology given the extensive parenchymal lung abnormalities. 5. Fluid in the mid to distal esophagus which may represent spontaneous reflux 1 motility disorder. 6. Reflux of contrast into the inferior vena cava and hepatic veins may be indicative of increased right heart pressure. Aortic Atherosclerosis (ICD10-I70.0) and Emphysema (ICD10-J43.9). Electronically Signed   By: Bretta Bang III M.D.   On: 09/21/2019 09:41   MR BRAIN WO CONTRAST  Result Date: 09/20/2019 CLINICAL DATA:  Initial evaluation for acute left upper extremity weakness. EXAM: MRI HEAD WITHOUT CONTRAST MRI CERVICAL SPINE WITHOUT CONTRAST TECHNIQUE: Multiplanar, multiecho pulse sequences of the brain and surrounding structures, and cervical spine, to include the craniocervical junction and cervicothoracic junction, were obtained without intravenous contrast. COMPARISON:  Comparison  made with prior CT from earlier the same day. FINDINGS: MRI HEAD FINDINGS Brain: Generalized age-related cerebral atrophy. Extensive chronic microvascular ischemic disease with multiple remote lacunar infarcts seen clustered about the bilateral basal ganglia and thalami. Patchy involvement of the pons noted as well. 8 mm focus of restricted diffusion involving the left caudate consistent with an acute ischemic infarct (series 5, image 8). Associated susceptibility artifact suggests petechial hemorrhage without frank hemorrhagic transformation. No additional punctate 4 mm cortical infarct noted at the left occipital lobe (a series 5, image 76). No associated hemorrhage or mass effect. Additional faint subcentimeter diffusion abnormality at the high right frontal centrum semi ovale consistent with subacute small vessel ischemia. Mild residual diffusion abnormality seen about a largely chronic appearing lacunar infarct at the posterior right basal ganglia. Additional 12 mm vague focus of diffusion abnormality involving the left cerebellum consistent with a subacute left cerebellar infarct. No associated hemorrhage or mass effect. Multiple scattered chronic micro hemorrhages seen involving both cerebral and cerebellar hemispheres, most notable about the deep gray nuclei, favored to be related to chronic poorly controlled hypertension. Sequelae of cerebral amyloid would be the primary  differential consideration. No mass lesion, midline shift or mass effect. No hydrocephalus. No extra-axial fluid collection. Pituitary gland suprasellar region normal. Midline structures intact. Vascular: Major intracranial vascular flow voids are maintained. Skull and upper cervical spine: Craniocervical junction within normal limits. Bone marrow signal intensity normal. No scalp soft tissue abnormality. Sinuses/Orbits: Globes and orbital soft tissues within normal limits. Paranasal sinuses are largely clear. No significant mastoid  effusion. Other: None. MRI CERVICAL SPINE FINDINGS Alignment: Vertebral bodies normally aligned with preservation of the normal cervical lordosis. No listhesis. Vertebrae: Vertebral body height maintained without evidence for acute or chronic fracture. Bone marrow signal intensity somewhat heterogeneous but within normal limits. No discrete or worrisome osseous lesions. No abnormal marrow edema. Cord: Signal intensity within the cervical spinal cord is within normal limits. Posterior Fossa, vertebral arteries, paraspinal tissues: Craniocervical junction within normal limits. Paraspinous and prevertebral soft tissues normal. Normal flow voids seen within the vertebral arteries bilaterally. Disc levels: C2-C3: Unremarkable. C3-C4: Broad central disc protrusion indents the ventral thecal sac, partially effacing the ventral CSF. Mild spinal stenosis without cord deformity. Superimposed uncovertebral hypertrophy without significant foraminal encroachment. C4-C5: Diffuse degenerative disc osteophyte with bilateral uncovertebral hypertrophy. Broad posterior component flattens and effaces the ventral thecal sac, greater on the left. Superimposed mild facet and ligament flavum hypertrophy. Resultant moderate spinal stenosis with mild cord flattening. Left greater than right uncinate spurring with resultant moderate left worse than right C5 foraminal stenosis. C5-C6: Chronic intervertebral disc space narrowing with diffuse degenerative disc osteophyte. Broad posterior component flattens and partially faces the ventral thecal sac resultant mild spinal stenosis. No cord deformity. Severe right with mild left C6 foraminal stenosis. C6-C7: Mild disc bulge with uncovertebral hypertrophy. No significant spinal stenosis. Moderate right C7 foraminal stenosis. No significant left foraminal narrowing. C7-T1:  Unremarkable. Visualized upper thoracic spine demonstrates no significant finding. IMPRESSION: MRI HEAD IMPRESSION: 1. 8 mm  acute ischemic left basal ganglia infarct. Associated petechial hemorrhage without frank hemorrhagic transformation. 2. Additional small volume subacute ischemic changes involving the high right frontal centrum semi ovale and left cerebellum as above. 3. Underlying severe chronic microvascular ischemic disease with multiple remote lacunar infarcts involving the deep gray nuclei and pons. 4. Multiple scattered chronic micro hemorrhages involving both cerebral and cerebellar hemispheres, most pronounced about the deep gray nuclei. Changes related to chronic poorly controlled hypertension is favored. Cerebral amyloid would be the primary differential consideration. MRI CERVICAL SPINE IMPRESSION: 1. Degenerative disc osteophyte at C4-5 with resultant moderate spinal stenosis, with moderate left worse than right C5 foraminal narrowing. Finding could contribute to left-sided radicular symptoms. 2. Disc bulge with uncovertebral spurring at C5-6 and C6-7 with resultant moderate to severe right C6 and C7 foraminal stenosis. 3. Broad-based central disc protrusion at C3-4 with resultant mild spinal stenosis. Electronically Signed   By: Jeannine Boga M.D.   On: 09/20/2019 22:41   MR Cervical Spine Wo Contrast  Result Date: 09/20/2019 CLINICAL DATA:  Initial evaluation for acute left upper extremity weakness. EXAM: MRI HEAD WITHOUT CONTRAST MRI CERVICAL SPINE WITHOUT CONTRAST TECHNIQUE: Multiplanar, multiecho pulse sequences of the brain and surrounding structures, and cervical spine, to include the craniocervical junction and cervicothoracic junction, were obtained without intravenous contrast. COMPARISON:  Comparison made with prior CT from earlier the same day. FINDINGS: MRI HEAD FINDINGS Brain: Generalized age-related cerebral atrophy. Extensive chronic microvascular ischemic disease with multiple remote lacunar infarcts seen clustered about the bilateral basal ganglia and thalami. Patchy involvement of the pons  noted as well. 8 mm  focus of restricted diffusion involving the left caudate consistent with an acute ischemic infarct (series 5, image 8). Associated susceptibility artifact suggests petechial hemorrhage without frank hemorrhagic transformation. No additional punctate 4 mm cortical infarct noted at the left occipital lobe (a series 5, image 76). No associated hemorrhage or mass effect. Additional faint subcentimeter diffusion abnormality at the high right frontal centrum semi ovale consistent with subacute small vessel ischemia. Mild residual diffusion abnormality seen about a largely chronic appearing lacunar infarct at the posterior right basal ganglia. Additional 12 mm vague focus of diffusion abnormality involving the left cerebellum consistent with a subacute left cerebellar infarct. No associated hemorrhage or mass effect. Multiple scattered chronic micro hemorrhages seen involving both cerebral and cerebellar hemispheres, most notable about the deep gray nuclei, favored to be related to chronic poorly controlled hypertension. Sequelae of cerebral amyloid would be the primary differential consideration. No mass lesion, midline shift or mass effect. No hydrocephalus. No extra-axial fluid collection. Pituitary gland suprasellar region normal. Midline structures intact. Vascular: Major intracranial vascular flow voids are maintained. Skull and upper cervical spine: Craniocervical junction within normal limits. Bone marrow signal intensity normal. No scalp soft tissue abnormality. Sinuses/Orbits: Globes and orbital soft tissues within normal limits. Paranasal sinuses are largely clear. No significant mastoid effusion. Other: None. MRI CERVICAL SPINE FINDINGS Alignment: Vertebral bodies normally aligned with preservation of the normal cervical lordosis. No listhesis. Vertebrae: Vertebral body height maintained without evidence for acute or chronic fracture. Bone marrow signal intensity somewhat heterogeneous but  within normal limits. No discrete or worrisome osseous lesions. No abnormal marrow edema. Cord: Signal intensity within the cervical spinal cord is within normal limits. Posterior Fossa, vertebral arteries, paraspinal tissues: Craniocervical junction within normal limits. Paraspinous and prevertebral soft tissues normal. Normal flow voids seen within the vertebral arteries bilaterally. Disc levels: C2-C3: Unremarkable. C3-C4: Broad central disc protrusion indents the ventral thecal sac, partially effacing the ventral CSF. Mild spinal stenosis without cord deformity. Superimposed uncovertebral hypertrophy without significant foraminal encroachment. C4-C5: Diffuse degenerative disc osteophyte with bilateral uncovertebral hypertrophy. Broad posterior component flattens and effaces the ventral thecal sac, greater on the left. Superimposed mild facet and ligament flavum hypertrophy. Resultant moderate spinal stenosis with mild cord flattening. Left greater than right uncinate spurring with resultant moderate left worse than right C5 foraminal stenosis. C5-C6: Chronic intervertebral disc space narrowing with diffuse degenerative disc osteophyte. Broad posterior component flattens and partially faces the ventral thecal sac resultant mild spinal stenosis. No cord deformity. Severe right with mild left C6 foraminal stenosis. C6-C7: Mild disc bulge with uncovertebral hypertrophy. No significant spinal stenosis. Moderate right C7 foraminal stenosis. No significant left foraminal narrowing. C7-T1:  Unremarkable. Visualized upper thoracic spine demonstrates no significant finding. IMPRESSION: MRI HEAD IMPRESSION: 1. 8 mm acute ischemic left basal ganglia infarct. Associated petechial hemorrhage without frank hemorrhagic transformation. 2. Additional small volume subacute ischemic changes involving the high right frontal centrum semi ovale and left cerebellum as above. 3. Underlying severe chronic microvascular ischemic disease  with multiple remote lacunar infarcts involving the deep gray nuclei and pons. 4. Multiple scattered chronic micro hemorrhages involving both cerebral and cerebellar hemispheres, most pronounced about the deep gray nuclei. Changes related to chronic poorly controlled hypertension is favored. Cerebral amyloid would be the primary differential consideration. MRI CERVICAL SPINE IMPRESSION: 1. Degenerative disc osteophyte at C4-5 with resultant moderate spinal stenosis, with moderate left worse than right C5 foraminal narrowing. Finding could contribute to left-sided radicular symptoms. 2. Disc bulge with uncovertebral spurring at  C5-6 and C6-7 with resultant moderate to severe right C6 and C7 foraminal stenosis. 3. Broad-based central disc protrusion at C3-4 with resultant mild spinal stenosis. Electronically Signed   By: Rise Mu M.D.   On: 09/20/2019 22:41   DG CHEST PORT 1 VIEW  Result Date: 09/21/2019 CLINICAL DATA:  Left hand paralysis weakness EXAM: PORTABLE CHEST 1 VIEW COMPARISON:  09/11/2019, 04/19/2019, CT chest 04/21/2019 FINDINGS: Diffuse bilateral reticular and ground-glass opacity. Stable cardiomediastinal silhouette. No pleural effusion or pneumothorax. IMPRESSION: Interval diffuse bilateral interstitial and ground-glass opacity some of which is due to chronic change though suspect acute superimposed process such as possible atypical or viral pneumonia. Electronically Signed   By: Jasmine Pang M.D.   On: 09/21/2019 00:34   ECHOCARDIOGRAM COMPLETE  Result Date: 09/22/2019    ECHOCARDIOGRAM REPORT   Patient Name:   Patricia Stanley Date of Exam: 09/22/2019 Medical Rec #:  [id number]        Height:       61.0 in Accession #:    [id number]       Weight:       107.6 lb Date of Birth:  17-Aug-1945         BSA:          1.451 m Patient Age:    74 years         BP:           169/86 mmHg Patient Gender: F                HR:           77 bpm. Exam Location:  Inpatient Procedure: 2D Echo  Indications:    stroke 434.91  History:        Patient has prior history of Echocardiogram examinations, most                 recent 04/19/2019. Risk Factors:Hypertension.  Sonographer:    Delcie Roch Referring Phys: [id number] ANASTASSIA DOUTOVA IMPRESSIONS  1. Left ventricular ejection fraction, by estimation, is 55 to 60%. The left ventricle has normal function. The left ventricle has no regional wall motion abnormalities. Left ventricular diastolic parameters were normal.  2. Right ventricular systolic function is normal. The right ventricular size is moderately enlarged. There is moderately elevated pulmonary artery systolic pressure. The estimated right ventricular systolic pressure is 45.0 mmHg.  3. Left atrial size was moderately dilated.  4. Right atrial size was mildly dilated.  5. The mitral valve is normal in structure. Mild to moderate mitral valve regurgitation. No evidence of mitral stenosis.  6. The aortic valve is normal in structure. Aortic valve regurgitation is not visualized. No aortic stenosis is present.  7. Tricuspid valve regurgitation is moderate.  8. Pulmonic valve regurgitation is moderate.  9. The inferior vena cava is normal in size with greater than 50% respiratory variability, suggesting right atrial pressure of 3 mmHg. FINDINGS  Left Ventricle: Left ventricular ejection fraction, by estimation, is 55 to 60%. The left ventricle has normal function. The left ventricle has no regional wall motion abnormalities. The left ventricular internal cavity size was normal in size. There is  no left ventricular hypertrophy. Left ventricular diastolic parameters were normal. Right Ventricle: The right ventricular size is moderately enlarged. No increase in right ventricular wall thickness. Right ventricular systolic function is normal. There is moderately elevated pulmonary artery systolic pressure. The tricuspid regurgitant  velocity is 3.24 m/s, and with an assumed right atrial pressure of 3  mmHg, the estimated right ventricular systolic pressure is 45.0 mmHg. Left Atrium: Left atrial size was moderately dilated. Right Atrium: Right atrial size was mildly dilated. Pericardium: Trivial pericardial effusion is present. Mitral Valve: The mitral valve is normal in structure. Normal mobility of the mitral valve leaflets. Mild mitral annular calcification. Mild to moderate mitral valve regurgitation. No evidence of mitral valve stenosis. Tricuspid Valve: The tricuspid valve is normal in structure. Tricuspid valve regurgitation is moderate . No evidence of tricuspid stenosis. Aortic Valve: The aortic valve is normal in structure. Aortic valve regurgitation is not visualized. No aortic stenosis is present. Pulmonic Valve: The pulmonic valve was normal in structure. Pulmonic valve regurgitation is moderate. No evidence of pulmonic stenosis. Aorta: The aortic root is normal in size and structure. Venous: The inferior vena cava is normal in size with greater than 50% respiratory variability, suggesting right atrial pressure of 3 mmHg. IAS/Shunts: No atrial level shunt detected by color flow Doppler.  LEFT VENTRICLE PLAX 2D LVIDd:         4.16 cm     Diastology LVIDs:         2.56 cm     LV e' lateral:   10.10 cm/s LV PW:         0.95 cm     LV E/e' lateral: 9.1 LV IVS:        0.74 cm     LV e' medial:    8.49 cm/s LVOT diam:     1.90 cm     LV E/e' medial:  10.8 LV SV:         51 LV SV Index:   35 LVOT Area:     2.84 cm  LV Volumes (MOD) LV vol d, MOD A4C: 46.2 ml LV vol s, MOD A4C: 20.7 ml LV SV MOD A4C:     46.2 ml RIGHT VENTRICLE             IVC RV S prime:     13.50 cm/s  IVC diam: 1.67 cm TAPSE (M-mode): 1.9 cm LEFT ATRIUM             Index       RIGHT ATRIUM           Index LA diam:        3.50 cm 2.41 cm/m  RA Area:     15.00 cm LA Vol (A2C):   51.7 ml 35.63 ml/m RA Volume:   38.80 ml  26.74 ml/m LA Vol (A4C):   69.1 ml 47.62 ml/m LA Biplane Vol: 60.9 ml 41.96 ml/m  AORTIC VALVE LVOT Vmax:   92.60  cm/s LVOT Vmean:  54.300 cm/s LVOT VTI:    0.180 m  AORTA Ao Root diam: 2.60 cm MITRAL VALVE               TRICUSPID VALVE MV Area (PHT): 4.39 cm    TR Peak grad:   42.0 mmHg MV Decel Time: 173 msec    TR Vmax:        324.00 cm/s MV E velocity: 91.70 cm/s MV A velocity: 93.60 cm/s  SHUNTS MV E/A ratio:  0.98        Systemic VTI:  0.18 m                            Systemic Diam: 1.90 cm Weston Brass MD Electronically signed by Weston Brass MD Signature Date/Time: 09/22/2019/11:21:18 AM  Final    VAS US LOWER EXTREMITY VENOUS (DVT)  Result Date: 09/22/2019  Lower Venous DVTStudy Indications: Stroke.  Risk Factors: None identified. Limitations: Patient positioning, patient immobility. Comparison Study: No prior studies. Performing Technologist: Chanda BusingGregory Collins RVT  Examination Guidelines: A complete evaluation includes B-mode imaging, spectral Doppler, color Doppler, and power Doppler as needed of all accessible portions of each vessel. Bilateral testing is considered an integral part of a complete examination. Limited examinations for reoccurring indications may be performed as noted. The reflux portion of the exam is performed with the patient in reverse Trendelenburg.  +---------+---------------+---------+-----------+----------+--------------+ RIGHT    CompressibilityPhasicitySpontaneityPropertiesThrombus Aging +---------+---------------+---------+-----------+----------+--------------+ CFV      Full           Yes      Yes                                 +---------+---------------+---------+-----------+----------+--------------+ SFJ      Full                                                        +---------+---------------+---------+-----------+----------+--------------+ FV Prox  Full                                                        +---------+---------------+---------+-----------+----------+--------------+ FV Mid   Full                                                         +---------+---------------+---------+-----------+----------+--------------+ FV DistalFull                                                        +---------+---------------+---------+-----------+----------+--------------+ PFV      Full                                                        +---------+---------------+---------+-----------+----------+--------------+ POP      Full           Yes      Yes                                 +---------+---------------+---------+-----------+----------+--------------+ PTV      Full                                                        +---------+---------------+---------+-----------+----------+--------------+ PERO     Full                                                        +---------+---------------+---------+-----------+----------+--------------+   +---------+---------------+---------+-----------+----------+--------------+  LEFT     CompressibilityPhasicitySpontaneityPropertiesThrombus Aging +---------+---------------+---------+-----------+----------+--------------+ CFV      Full           Yes      Yes                                 +---------+---------------+---------+-----------+----------+--------------+ SFJ      Full                                                        +---------+---------------+---------+-----------+----------+--------------+ FV Prox  Full                                                        +---------+---------------+---------+-----------+----------+--------------+ FV Mid   Full                                                        +---------+---------------+---------+-----------+----------+--------------+ FV DistalFull                                                        +---------+---------------+---------+-----------+----------+--------------+ PFV      Full                                                         +---------+---------------+---------+-----------+----------+--------------+ POP      Full           Yes      Yes                                 +---------+---------------+---------+-----------+----------+--------------+ PTV      Full                                                        +---------+---------------+---------+-----------+----------+--------------+ PERO     Full                                                        +---------+---------------+---------+-----------+----------+--------------+     Summary: RIGHT: - There is no evidence of deep vein thrombosis in the lower extremity.  - No cystic structure found in the popliteal fossa.  LEFT: - There is no evidence of deep vein thrombosis in the lower extremity.  - No  cystic structure found in the popliteal fossa.  *See table(s) above for measurements and observations.    Preliminary    Assessment/Plan: Diagnosis: Embolic infarcts, multifocal, anterior and posterior 1. Does the need for close, 24 hr/day medical supervision in concert with the patient's rehab needs make it unreasonable for this patient to be served in a less intensive setting? Yes 2. 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 Due to bladder management, bowel management, safety, skin/wound care, disease management, medication administration, pain management and patient education, does the patient require 24 hr/day rehab nursing? Yes 4. Does the patient require coordinated care of a physician, rehab nurse, therapy disciplines of PT, OT, SLP to address physical and functional deficits in the context of the above medical diagnosis(es)? Yes Addressing deficits in the following areas: balance, endurance, locomotion, strength, transferring, bowel/bladder control, bathing, dressing, feeding, grooming, toileting, cognition, speech and  psychosocial support 5. Can the patient actively participate in an intensive therapy program of at least 3 hrs of therapy per day at least 5 days per week? Yes 6. The potential for patient to make measurable gains while on inpatient rehab is excellent 7. Anticipated functional outcomes upon discharge from inpatient rehab are min assist  with PT, min assist with OT, min assist with SLP. 8. Estimated rehab length of stay to reach the above functional goals is: 10-14 days 9. Anticipated discharge destination: Home 10. Overall Rehab/Functional Prognosis: excellent  RECOMMENDATIONS: This patient's condition is appropriate for continued rehabilitative care in the following setting: CIR Patient has agreed to participate in recommended program. Yes Note that insurance prior authorization may be required for reimbursement for recommended care.  Comment: Patricia Stanley was with [place] in CIR for her CVA in December. She did very well at that time but has unfortunately suffered a new multifocal embolic stroke, and would greatly benefit from CIR again. Would recommend left wrist splint and left PRAFO boot. Thank you for this consult. We will continue to follow in Patricia Stanley's care.   Mcarthur Rossetti Angiulli, PA-C 09/22/2019   I have personally performed a face to face diagnostic evaluation, including, but not limited to relevant history and physical exam findings, of this patient and developed relevant assessment and plan.  Additionally, I have reviewed and concur with the physician assistant's documentation above.  Sula Soda, MD

## 2019-09-22 NOTE — Progress Notes (Signed)
Physical Therapy Treatment Patient Details Name: Patricia Stanley MRN: [id number] DOB: 1946-03-03 Today's Date: 09/22/2019    History of Present Illness 74 y.o. female with medical history significant of CVA in December, carotid artery stenosis status post right CEA 04/2019,depression, GERD, kidney stones, HTN, anemia, Chronic diarrhea who presented to the ED with left arm numbness and left leg weakness for a few days (worsening of chronic symptoms). Pt also with recent admit for adult failure to thrive. MRI revealed 8 mm acute ischemic left basal ganglia CVA.     PT Comments    Patient received in bed, pale, lethargic. Reports she is not feeling good and hurting all over. With encouragement she agrees to work with PT. Upon mobility patient found to be soaking wet. When asked if she knew she was wet she reported "yes". Patient reports B foot numbness. Left UE and LE weakness. Patient required max assist to perform supine to sit. Zero sitting balance this day. Continuously leaning posteriorly and requiring mod assist to return to midline. She required mod +2 assist for sit to stand and to transfer to recliner. Very unsteady with left LE hyperextension during standing. She will continue to benefit from skilled PT while here to improve functional mobility and independence.      Follow Up Recommendations  CIR     Equipment Recommendations  Other (comment)(TBD)    Recommendations for Other Services       Precautions / Restrictions Precautions Precautions: Fall Precaution Comments: L side weakness global weakness Restrictions Weight Bearing Restrictions: No    Mobility  Bed Mobility Overal bed mobility: Needs Assistance Bed Mobility: Supine to Sit     Supine to sit: Max assist     General bed mobility comments: patient found to be wet in bed, required max assist to get from supine to sitting at edge of bed. unable to maintain sitting balance while changing gown. Posterior leaning with  assist needed to attain full upright position.  Transfers Overall transfer level: Needs assistance Equipment used: Rolling walker (2 wheeled) Transfers: Sit to/from Stand Sit to Stand: Mod assist;+2 physical assistance Stand pivot transfers: Mod assist;+2 physical assistance       General transfer comment: patient unable to maintain standing balance while being cleaned. Leaning to right and forward. Requiring cues and assistance to stand upright and to pivot to recliner. Unable to keep right UE on walker.  Ambulation/Gait Ambulation/Gait assistance: Mod assist;+2 physical assistance Gait Distance (Feet): 4 Feet Assistive device: Rolling walker (2 wheeled) Gait Pattern/deviations: Step-to pattern;Decreased step length - right;Decreased step length - left;Trunk flexed;Staggering right;Narrow base of support Gait velocity: Decreased   General Gait Details: Patient has hyperextension on left, reports B feet numb. Difficulty maintaining standing balance, requires multimodal cues and physical assist for standing balance.   Stairs             Wheelchair Mobility    Modified Rankin (Stroke Patients Only) Modified Rankin (Stroke Patients Only) Pre-Morbid Rankin Score: Moderately severe disability Modified Rankin: Severe disability     Balance Overall balance assessment: Needs assistance Sitting-balance support: Feet supported Sitting balance-Leahy Scale: Zero Sitting balance - Comments: posterior leaning throughout sitting. Requiring assistance to bring her back up to neutral on several occasions.   Standing balance support: Bilateral upper extremity supported;During functional activity Standing balance-Leahy Scale: Poor Standing balance comment: reliant on external assist  Cognition Arousal/Alertness: Awake/alert;Lethargic Behavior During Therapy: Flat affect Overall Cognitive Status: No family/caregiver present to determine baseline  cognitive functioning Area of Impairment: Attention;Memory;Following commands;Awareness;Problem solving                   Current Attention Level: Sustained Memory: Decreased short-term memory Following Commands: Follows one step commands with increased time;Follows one step commands inconsistently   Awareness: Emergent Problem Solving: Slow processing;Requires verbal cues;Decreased initiation;Requires tactile cues General Comments: pt very lethargic initially but once sitting up more awake/alert. pt slow to respond to questions/commands requiring increased time and cues to do so. Pt also HOH so often requiring repetition       Exercises      General Comments        Pertinent Vitals/Pain Pain Assessment: Faces Faces Pain Scale: Hurts little more Pain Location: generalized all over Pain Intervention(s): Monitored during session;Repositioned    Home Living                      Prior Function            PT Goals (current goals can now be found in the care plan section) Acute Rehab PT Goals Patient Stated Goal: to get stronger PT Goal Formulation: With patient/family Time For Goal Achievement: 10/15/2019 Potential to Achieve Goals: Fair Progress towards PT goals: Progressing toward goals    Frequency    Min 4X/week      PT Plan Current plan remains appropriate    Co-evaluation              AM-PAC PT "6 Clicks" Mobility   Outcome Measure  Help needed turning from your back to your side while in a flat bed without using bedrails?: A Lot Help needed moving from lying on your back to sitting on the side of a flat bed without using bedrails?: A Lot Help needed moving to and from a bed to a chair (including a wheelchair)?: A Lot Help needed standing up from a chair using your arms (e.g., wheelchair or bedside chair)?: A Lot Help needed to walk in hospital room?: A Lot Help needed climbing 3-5 steps with a railing? : Total 6 Click Score: 11     End of Session Equipment Utilized During Treatment: Gait belt;Oxygen Activity Tolerance: Patient tolerated treatment well;Patient limited by fatigue Patient left: in chair;with chair alarm set;with call bell/phone within reach;with family/visitor present Nurse Communication: Mobility status PT Visit Diagnosis: Unsteadiness on feet (R26.81);Muscle weakness (generalized) (M62.81);Difficulty in walking, not elsewhere classified (R26.2);Other abnormalities of gait and mobility (R26.89);Hemiplegia and hemiparesis;Pain Hemiplegia - Right/Left: Left Hemiplegia - dominant/non-dominant: Non-dominant Hemiplegia - caused by: Cerebral infarction Pain - part of body: (all over)     Time: 1100-1125 PT Time Calculation (min) (ACUTE ONLY): 25 min  Charges:  $Gait Training: 8-22 mins $Therapeutic Activity: 8-22 mins                     Kristyn Conetta, PT, GCS 09/22/19,11:56 AM

## 2019-09-22 NOTE — Progress Notes (Signed)
STROKE TEAM PROGRESS NOTE   INTERVAL HISTORY Husband at bedside.  Patient sitting in chair, having breakfast.  Husband fed her with piece of bagel and she was choked and coughing.  Otherwise, she was able to eat small amount of scrambled eggs and some grits.  Patient continue to be cachectic looking, still has left upper extremity weakness especially distal with the wrist and finger movement.  Creatinine 1.4, CT abdomen pelvis canceled.  Autoimmune labs pending.  Vitals:   09/21/19 2355 09/22/19 0415 09/22/19 0754 09/22/19 1020  BP: (!) 155/75 (!) 162/74 (!) 169/86 (!) 155/78  Pulse: 67 73 74 76  Resp: '18 18 18 18  '$ Temp: 98 F (36.7 C) 98.9 F (37.2 C) 98.3 F (36.8 C) 98.1 F (36.7 C)  TempSrc: Oral Oral Oral Oral  SpO2: 97% 96% 98% 97%    CBC:  Recent Labs  Lab 09/20/19 2013 09/20/19 2013 09/21/19 0821 09/22/19 0204  WBC 10.6*   < > 17.0* 13.5*  NEUTROABS 8.2*  --   --   --   HGB 7.1*   < > 8.8* 8.2*  HCT 23.5*   < > 28.5* 25.8*  MCV 92.9   < > 90.5 88.4  PLT 313   < > 282 274   < > = values in this interval not displayed.    Basic Metabolic Panel:  Recent Labs  Lab 09/16/19 0248 09/20/19 2013 09/21/19 0226 09/22/19 0204  NA  --  141  --  140  K  --  4.7  --  4.6  CL  --  113*  --  111  CO2  --  22  --  21*  GLUCOSE  --  95  --  104*  BUN  --  22  --  21  CREATININE  --  1.00  --  1.40*  CALCIUM  --  8.8*  --  7.8*  MG   < >  --  1.2* 1.9  PHOS  --   --  2.7  --    < > = values in this interval not displayed.   Lipid Panel:     Component Value Date/Time   CHOL 59 09/21/2019 0226   TRIG 53 09/21/2019 0226   HDL 20 (L) 09/21/2019 0226   CHOLHDL 3.0 09/21/2019 0226   VLDL 11 09/21/2019 0226   LDLCALC 28 09/21/2019 0226   HgbA1c:  Lab Results  Component Value Date   HGBA1C 5.3 09/21/2019   Urine Drug Screen: No results found for: LABOPIA, COCAINSCRNUR, LABBENZ, AMPHETMU, THCU, LABBARB  Alcohol Level     Component Value Date/Time   ETH <10  04/18/2019 1234    IMAGING past 24 hours VAS [place] LOWER EXTREMITY VENOUS (DVT)  Result Date: 09/22/2019  Lower Venous DVTStudy Indications: Stroke.  Risk Factors: None identified. Limitations: Patient positioning, patient immobility. Comparison Study: No prior studies. Performing Technologist: Oliver Hum RVT  Examination Guidelines: A complete evaluation includes B-mode imaging, spectral Doppler, color Doppler, and power Doppler as needed of all accessible portions of each vessel. Bilateral testing is considered an integral part of a complete examination. Limited examinations for reoccurring indications may be performed as noted. The reflux portion of the exam is performed with the patient in reverse Trendelenburg.  +---------+---------------+---------+-----------+----------+--------------+ RIGHT    CompressibilityPhasicitySpontaneityPropertiesThrombus Aging +---------+---------------+---------+-----------+----------+--------------+ CFV      Full           Yes      Yes                                 +---------+---------------+---------+-----------+----------+--------------+  SFJ      Full                                                        +---------+---------------+---------+-----------+----------+--------------+ FV Prox  Full                                                        +---------+---------------+---------+-----------+----------+--------------+ FV Mid   Full                                                        +---------+---------------+---------+-----------+----------+--------------+ FV DistalFull                                                        +---------+---------------+---------+-----------+----------+--------------+ PFV      Full                                                        +---------+---------------+---------+-----------+----------+--------------+ POP      Full           Yes      Yes                                  +---------+---------------+---------+-----------+----------+--------------+ PTV      Full                                                        +---------+---------------+---------+-----------+----------+--------------+ PERO     Full                                                        +---------+---------------+---------+-----------+----------+--------------+   +---------+---------------+---------+-----------+----------+--------------+ LEFT     CompressibilityPhasicitySpontaneityPropertiesThrombus Aging +---------+---------------+---------+-----------+----------+--------------+ CFV      Full           Yes      Yes                                 +---------+---------------+---------+-----------+----------+--------------+ SFJ      Full                                                        +---------+---------------+---------+-----------+----------+--------------+   FV Prox  Full                                                        +---------+---------------+---------+-----------+----------+--------------+ FV Mid   Full                                                        +---------+---------------+---------+-----------+----------+--------------+ FV DistalFull                                                        +---------+---------------+---------+-----------+----------+--------------+ PFV      Full                                                        +---------+---------------+---------+-----------+----------+--------------+ POP      Full           Yes      Yes                                 +---------+---------------+---------+-----------+----------+--------------+ PTV      Full                                                        +---------+---------------+---------+-----------+----------+--------------+ PERO     Full                                                         +---------+---------------+---------+-----------+----------+--------------+     Summary: RIGHT: - There is no evidence of deep vein thrombosis in the lower extremity.  - No cystic structure found in the popliteal fossa.  LEFT: - There is no evidence of deep vein thrombosis in the lower extremity.  - No cystic structure found in the popliteal fossa.  *See table(s) above for measurements and observations.    Preliminary     PHYSICAL EXAM   Temp:  [97.4 F (36.3 C)-98.9 F (37.2 C)] 98.1 F (36.7 C) (05/20 1020) Pulse Rate:  [65-76] 76 (05/20 1020) Resp:  [16-18] 18 (05/20 1020) BP: (133-174)/(64-91) 155/78 (05/20 1020) SpO2:  [94 %-100 %] 97 % (05/20 1020)  General - Cachectic, well developed, lethargic.  Ophthalmologic - fundi not visualized due to noncooperation.  Cardiovascular - Regular rhythm and rate.  Mental Status -  Level of arousal and orientation to time, place, and person were intact. Language including expression, naming, repetition, comprehension was assessed and found intact. Mild to moderate dysarthria, psychomotor slowing  Cranial  Nerves II - XII - II - Visual field intact OU. III, IV, VI - Extraocular movements intact. V - Facial sensation intact bilaterally. VII - Facial movement intact bilaterally. VIII - Hearing & vestibular intact bilaterally. X - Palate elevates symmetrically. XI - Chin turning & shoulder shrug intact bilaterally. XII - Tongue protrusion intact.  Motor Strength - The patient's strength was normal in RUE and RLE, however, LUE proximal 4/5 but distal with wrist and finger movement 0/5. LLE 4/5 proximal and 2/5 ankle and toe DF/PF.  Bulk was decreased and fasciculations were absent.   Motor Tone - Muscle tone was assessed at the neck and appendages and was normal.  Reflexes - The patient's reflexes were symmetrica 1+l in all extremities and she had no pathological reflexes.  Sensory - Light touch, temperature/pinprick were assessed and were  symmetrical except bilateral anterior hand decreased sensation, L>R.    Coordination - The patient had normal movements in the hands with no ataxia or dysmetria, but slow bilaterally.  Tremor was absent.  Gait and Station - deferred.   ASSESSMENT/PLAN Patricia Stanley is a 74 y.o. female with history of CVA in December 2020 with residual left-sided deficits, HLD, HTN, carotid artery stenosis status post right CEA 04/2019, failure to thrive and anemia, discharged from [facility] last Friday after admission for persistent diarrhea, worsening weakness, anemia and severe deconditioning, who presents today via EMS with worsening of her left sided weakness.    Stroke:  Multifocal punctate anterior and posterior infarcts, embolic pattern, source unclear  CT head No acute abnormality. Small focal L cerebellar hypodensity. Small vessel disease. Atrophy. Multiple old B lacunes.   MRI  L CR infarct w/ petechial hemorrhage, R high frontal SO, L cerebellar and left PCA territory punctate infarcts. Small vessel disease. Atrophy. Old lacunes white matter and pons. Multiple scattered microhemorrhages.  CTA head & neck R CEA patent. Mild stenosis proximal L CCA. Moderate L V4 stenosis. Moderate B MCA stenosis.  2D Echo EF 55-60%. No source of embolus. LA dilated  LE venous doppler no DVT  LDL 28  HgbA1c 5.7  D-dimer 14.59  SCDs for VTE prophylaxis  aspirin 325 mg daily prior to admission, now on aspirin 325 mg daily. No DAPT due to severe anemia needing PRBC  Therapy recommendations:  CIR  Disposition:  pending   Weight loss and FTT  Has lost about 15 to 20 pound for the last 6 months  Husband considered chronic diarrhea is the cause -5/9-5/14 admitted for diarrhea, anemia and FTT - improving now  CTA chest did not show malignancy but enlarged lymph nodes likely reactive.   04/2019 CT abdomen pelvis negative for malignancy  04/2019 CT chest showed diffuse wall  thickening of the lower third of the esophagus recommend outpatient EGD.  Recent chronic diarrhea, now improved.  Has not follow-up with GI since last discharge 04/2019  Repeat CT abdomen pelvis canceled due to elevated Cre - may consider once Cre normalized   Recommend GI consult  Anemia  Likely due to nutritional  Hemoglobin 7.6-7.2-7.1-PRBC-8.8-8.2   Close monitoring  B12 471  FA 4.1 - on supplement  Homocysteine level pending  Iron panel Fe 21, TIBC 134, ferritin 1206->1056  Occult blood pending  Leukocytosis  WBC 11.7-12.8-10.8-17.0-13.5   ESR 29, and CRP 19.8  Ferritin 1206->1056  Autoimmune labs pending  Chest CT - bilateral pneumonia, pleural effusion, interstitial pneumonitis  Hx stroke/TIA  04/2019 - right corona radiata infarct secondary  to small vessel disease in setting of large vessel disease source.  EF 60 to 65%.  LDL 87 and A1c 5.8.  Carotid Doppler right ICA 40 to 59% stenosis.  CTA head and neck right ICA plaque 65 to 70% stenosis.  DAPT x 3 mos then aspirin alone. R ICA stenosis->R CEA 04/2019.  Hypertension  Variable, up to 175/134 . Permissive hypertension (OK if <180/105) but gradually normalize in 3-5 days . Long-term BP goal normotensive  Hyperlipidemia  Home meds:  lipitor 80, resumed in hospital  LDL 28, goal < 70  Decrease Lipitor to 40   Continue statin at discharge  Other Stroke Risk Factors  Advanced age  Former Cigarette smoker, quit 24y ago  Migraines  Chronic diastolic CHF  Other Active Problems  FTT  Major depression  Malnutrition of moderate degree  Symptomatic anemia  Generalized body aches  Bronchiectasis vs atypical PNA  AKI 1.0->1.4  Hospital day # 1   Rosalin Hawking, MD PhD Stroke Neurology 09/22/2019 11:15 AM  To contact Stroke Continuity provider, please refer to [email or web address] After hours, contact General Neurology

## 2019-09-22 NOTE — Progress Notes (Signed)
Inpatient Rehab Admissions Coordinator Note:   Per PT/OT recommendations, pt was screened for CIR candidacy by Estill Dooms, PT, DPT.  At this time we are recommending inpatient rehab consult.  I will place an order per our protocol.  Please contact me with questions.   Estill Dooms, PT, DPT [phone] 09/22/19 1:03 PM

## 2019-09-22 NOTE — Progress Notes (Signed)
  Echocardiogram 2D Echocardiogram has been performed.  Delcie Roch 09/22/2019, 8:58 AM

## 2019-09-22 NOTE — Progress Notes (Signed)
Bilateral lower extremity venous duplex has been completed. Preliminary results can be found in CV Proc through chart review.   09/22/19 9:01 AM Olen Cordial RVT

## 2019-09-23 DIAGNOSIS — R52 Pain, unspecified: Secondary | ICD-10-CM

## 2019-09-23 DIAGNOSIS — I10 Essential (primary) hypertension: Secondary | ICD-10-CM

## 2019-09-23 DIAGNOSIS — K5900 Constipation, unspecified: Secondary | ICD-10-CM

## 2019-09-23 DIAGNOSIS — J479 Bronchiectasis, uncomplicated: Secondary | ICD-10-CM

## 2019-09-23 DIAGNOSIS — I639 Cerebral infarction, unspecified: Secondary | ICD-10-CM

## 2019-09-23 LAB — COMPREHENSIVE METABOLIC PANEL
ALT: 22 U/L (ref 0–44)
AST: 26 U/L (ref 15–41)
Albumin: 1.2 g/dL — ABNORMAL LOW (ref 3.5–5.0)
Alkaline Phosphatase: 127 U/L — ABNORMAL HIGH (ref 38–126)
Anion gap: 7 (ref 5–15)
BUN: 24 mg/dL — ABNORMAL HIGH (ref 8–23)
CO2: 21 mmol/L — ABNORMAL LOW (ref 22–32)
Calcium: 8.2 mg/dL — ABNORMAL LOW (ref 8.9–10.3)
Chloride: 112 mmol/L — ABNORMAL HIGH (ref 98–111)
Creatinine, Ser: 1.19 mg/dL — ABNORMAL HIGH (ref 0.44–1.00)
GFR calc Af Amer: 52 mL/min — ABNORMAL LOW (ref >60)
GFR calc non Af Amer: 45 mL/min — ABNORMAL LOW (ref >60)
Glucose, Bld: 105 mg/dL — ABNORMAL HIGH (ref 70–99)
Potassium: 4.2 mmol/L (ref 3.5–5.1)
Sodium: 140 mmol/L (ref 135–145)
Total Bilirubin: 0.9 mg/dL (ref 0.3–1.2)
Total Protein: 4.6 g/dL — ABNORMAL LOW (ref 6.5–8.1)

## 2019-09-23 LAB — ANTI-DNA ANTIBODY, DOUBLE-STRANDED: ds DNA Ab: <1 IU/mL (ref 0–9)

## 2019-09-23 LAB — CBC
HCT: 25.7 % — ABNORMAL LOW (ref 36.0–46.0)
Hemoglobin: 8 g/dL — ABNORMAL LOW (ref 12.0–15.0)
MCH: 27.9 pg (ref 26.0–34.0)
MCHC: 31.1 g/dL (ref 30.0–36.0)
MCV: 89.5 fL (ref 80.0–100.0)
Platelets: 265 10*3/uL (ref 150–400)
RBC: 2.87 MIL/uL — ABNORMAL LOW (ref 3.87–5.11)
RDW: 19 % — ABNORMAL HIGH (ref 11.5–15.5)
WBC: 12.5 10*3/uL — ABNORMAL HIGH (ref 4.0–10.5)
nRBC: 0.2 % (ref 0.0–0.2)

## 2019-09-23 LAB — SJOGRENS SYNDROME-B EXTRACTABLE NUCLEAR ANTIBODY: SSB (La) (ENA) Antibody, IgG: <0.2 AI (ref 0.0–0.9)

## 2019-09-23 LAB — CORTISOL: Cortisol, Plasma: 7.5 ug/dL

## 2019-09-23 LAB — TSH: TSH: 2.788 u[IU]/mL (ref 0.350–4.500)

## 2019-09-23 LAB — SJOGRENS SYNDROME-A EXTRACTABLE NUCLEAR ANTIBODY: SSA (Ro) (ENA) Antibody, IgG: 0.2 AI (ref 0.0–0.9)

## 2019-09-23 LAB — MAGNESIUM: Magnesium: 1.6 mg/dL — ABNORMAL LOW (ref 1.7–2.4)

## 2019-09-23 LAB — ANTINUCLEAR ANTIBODIES, IFA: ANA Ab, IFA: NEGATIVE

## 2019-09-23 LAB — RHEUMATOID FACTOR: Rheumatoid fact SerPl-aCnc: 151.9 IU/mL — ABNORMAL HIGH (ref 0.0–13.9)

## 2019-09-23 MED ORDER — LACTULOSE 10 GM/15ML PO SOLN: 30.0000 g | Freq: Two times a day (BID) | ORAL | Status: DC | PRN

## 2019-09-23 MED ORDER — IOHEXOL 12 MG/ML PO SOLN: 500.0000 mL | ORAL | Status: AC

## 2019-09-23 MED ORDER — SENNOSIDES-DOCUSATE SODIUM 8.6-50 MG PO TABS
1.0000 | ORAL_TABLET | Freq: Two times a day (BID) | ORAL | Status: DC
  Administered 2019-09-23 – 2019-09-27 (×9): 1 via ORAL
  Filled 2019-09-23 (×9): qty 1

## 2019-09-23 MED ORDER — BISACODYL 10 MG RE SUPP: 10.0000 mg | Freq: Every day | RECTAL | Status: DC | PRN

## 2019-09-23 MED ORDER — LACTULOSE 10 GM/15ML PO SOLN
20.0000 g | Freq: Two times a day (BID) | ORAL | Status: DC | PRN
  Administered 2019-09-27: 20 g via ORAL
  Filled 2019-09-23: qty 30

## 2019-09-23 MED ORDER — FUROSEMIDE 10 MG/ML IJ SOLN
40.0000 mg | Freq: Four times a day (QID) | INTRAMUSCULAR | Status: AC
  Administered 2019-09-23 (×2): 40 mg via INTRAVENOUS
  Filled 2019-09-23 (×2): qty 4

## 2019-09-23 MED ORDER — MAGNESIUM SULFATE 2 GM/50ML IV SOLN: 2.0000 g | Freq: Once | INTRAVENOUS | Status: DC

## 2019-09-23 MED ORDER — IOHEXOL 9 MG/ML PO SOLN
ORAL | Status: AC
  Administered 2019-09-23: 500 mL
  Filled 2019-09-23: qty 1000

## 2019-09-23 MED ORDER — MAGNESIUM SULFATE 4 GM/100ML IV SOLN
4.0000 g | Freq: Once | INTRAVENOUS | Status: AC
  Administered 2019-09-23: 4 g via INTRAVENOUS
  Filled 2019-09-23 (×2): qty 100

## 2019-09-23 NOTE — Evaluation (Signed)
Clinical/Bedside Swallow Evaluation Patient Details  Name: Patricia Stanley MRN: [id number] Date of Birth: 20-Oct-1945  Today's Date: 09/23/2019 Time: SLP Start Time (ACUTE ONLY): 6269 SLP Stop Time (ACUTE ONLY): 1600 SLP Time Calculation (min) (ACUTE ONLY): 11 min  Past Medical History:  Past Medical History:  Diagnosis Date  . Closed fracture of coccyx ([place]) 2015   AFTER FALL  . Depression   . GERD (gastroesophageal reflux disease)   . Headache    MIGRAINES OCCASIONAL  . History of kidney stones    HAS NOW  . Hypertension    Past Surgical History:  Past Surgical History:  Procedure Laterality Date  . ABDOMINAL HYSTERECTOMY     1 OVARY LEFT  . APPENDECTOMY  AGE 81   DONE WITH OVARY REMOVAL  . COLONOSCOPY WITH PROPOFOL N/A 12/03/2015   Procedure: COLONOSCOPY WITH PROPOFOL;  Surgeon: Garlan Fair, MD;  Location: [facility];  Service: Endoscopy;  Laterality: N/A;  . ENDARTERECTOMY Right 04/21/2019   Procedure: ENDARTERECTOMY CAROTID;  Surgeon: Marty Heck, MD;  Location: [facility];  Service: Vascular;  Laterality: Right;  . ESOPHAGOGASTRODUODENOSCOPY (EGD) WITH PROPOFOL N/A 12/03/2015   Procedure: ESOPHAGOGASTRODUODENOSCOPY (EGD) WITH PROPOFOL;  Surgeon: Garlan Fair, MD;  Location: [facility];  Service: Endoscopy;  Laterality: N/A;  . TONSILLECTOMY  AGE 21   AND ADENOIDS REMOVED   HPI:  Pt is a 74 y.o. female with medical history significant of CVA in December, carotid artery stenosis status post right CEA 04/2019,depression, GERD, kidney stones, HTN, anemia, Chronic diarrhea who presented to the ED with left arm numbness and left leg weakness for a few days (worsening of chronic symptoms). Pt also with recent admit for adult failure to thrive. MRI revealed 8 mm acute ischemic left basal ganglia CVA.    Assessment / Plan / Recommendation Clinical Impression  Pt was seen for bedside swallow evaluation and she denied a history of dysphagia. Pt's husband reported tht  the pt had some difficulty (i.e., coughing) with a bagel yesterday which was in the open for a while and was "about as hard as a shoe", but she has otherwise tolerated meals without signs of aspiration. Pt's nurse also denied observation of signs of aspiration. Oral mechanism exam was [facility]. Dentition was adequate. She tolerated all solids and liquids without signs or symptoms of oropharyngeal dysphagia. A regular texture diet with thin liquids is recommended at this this and further skilled SLP services are not clinically indicated for swallowing.  SLP Visit Diagnosis: Dysphagia, unspecified (R13.10)    Aspiration Risk  No limitations    Diet Recommendation Regular;Thin liquid   Liquid Administration via: Cup;Straw Medication Administration: Whole meds with liquid Supervision: Patient able to self feed    Other  Recommendations Oral Care Recommendations: Oral care BID;Patient independent with oral care   Follow up Recommendations None      Frequency and Duration            Prognosis        Swallow Study   General Date of Onset: 09/22/19 HPI: Pt is a 74 y.o. female with medical history significant of CVA in December, carotid artery stenosis status post right CEA 04/2019,depression, GERD, kidney stones, HTN, anemia, Chronic diarrhea who presented to the ED with left arm numbness and left leg weakness for a few days (worsening of chronic symptoms). Pt also with recent admit for adult failure to thrive. MRI revealed 8 mm acute ischemic left basal ganglia CVA.  Type of Study: Bedside Swallow  Evaluation Previous Swallow Assessment: none Diet Prior to this Study: Regular;Thin liquids Temperature Spikes Noted: No Respiratory Status: Nasal cannula History of Recent Intubation: No Behavior/Cognition: Alert;Cooperative;Pleasant mood Oral Cavity Assessment: Within Functional Limits Oral Care Completed by SLP: No Vision: Functional for self-feeding Self-Feeding Abilities: Able to feed  self Patient Positioning: Upright in chair;Postural control adequate for testing Baseline Vocal Quality: Normal Volitional Cough: Strong Volitional Swallow: Able to elicit    Oral/Motor/Sensory Function Overall Oral Motor/Sensory Function: Within functional limits   Ice Chips Ice chips: Within functional limits Presentation: Spoon   Thin Liquid Thin Liquid: Within functional limits Presentation: Straw    Nectar Thick Nectar Thick Liquid: Not tested   Honey Thick Honey Thick Liquid: Not tested   Puree Puree: Within functional limits Presentation: Spoon   Solid     Solid: Within functional limits Presentation: Self Fed     Patricia Stanley I. Vear Clock, MS, CCC-SLP Acute Rehabilitation Services Office number [phone] Pager [phone]  Scheryl Marten 09/23/2019,4:21 PM

## 2019-09-23 NOTE — Progress Notes (Signed)
Patricia Stanley  XBJ:[id number] DOB: [birth year] DOA: 09/20/2019 PCP: Josetta Huddle, MD    Brief Narrative:  74 year old with history of CVA December 2020, carotid artery stenosis status post right CEA December 2020, depression, GERD, kidney stones, HTN, chronic diarrhea, and chronic anemia who presented to the ED with a left arm numbness and left leg weakness for a few days.  This represents a worsening of her chronic symptoms dating back to December 2020.  The patient has recently undergone an evaluation for failure to thrive and was found to have chronic diarrhea with GI initiating Creon, cholestyramine, and Imodium.  She was felt to be too frail for endoscopic evaluation at that time.  Significant Events: 04/2019 hospitalized for R MCA CVA leading to CEA 5/9 > 5/14 admit to [facility] w/ FTT - ongoing diarrhea, weight loss, FTT 5/18 admit via [facility] ED w/ L arm and leg weakness  5/20 venous duplex w/o evidence of DVT 5/20 TTE -EF 55-60% with no WMA -RV moderately enlarged with moderately elevated PA systolic pressure at 45 -LA moderately dilated -RA mildly dilated -moderate pulmonic regurg  Antimicrobials:  None  Subjective: Resting comfortably in bed.  In good spirits today.  Denies chest pain nausea vomiting or abdominal pain.  Significant left upper and lower extremity deficits persist without change.  Assessment & Plan:  Multifocal punctate anterior and posterior infarcts, embolic pattern MRI noted L CR infarct w/ petechial hemorrhage, R high frontal SO, L cerebellar and left PCA territory punctate infarcts, small vessel disease, atrophy, and old lacunes white matter and pons w/ multiple scattered microhemorrhages - CTa head/neck noted patent R CEA and mild stenosis prox L CCA - LDL 28 - A1c 5.7 - ASA 325mg  only for now (no DAPT due to anemia requiring tx)  Acute hypoxic respiratory failure - bronchiectasis versus atypical pneumonia v/s recurrent aspiration  Patient requiring 2 L nasal  cannula to keep sats greater than 85% -lung hyperexpansion and chronic bronchitic changes noted on CXR December 2020 -suggestion of probable chronic interstitial lung disease via CT chest December 2020 - CTa this admit notes what appears to be interstitial pulmonary disease - will ask Pulmonary to see her in consultation  Chronic normocytic anemia  Hemoglobin 7.1 at presentation but was 7.2 Sep 16, 2019 -hemoglobin improved as expected with transfusion of 1 unit PRBC this admission -suspect this is anemia of chronic disease -Albumin is profoundly low as is TIBC -accordingly iron levels are low as well -B12 is acceptable but folate is actually below goal -supplement with folic acid   Acute kidney injury With volume resuscitation and avoidance of further IV contrast the patient's creatinine appears to be normalizing -continue to follow  Unintentional Weight Loss / FTT Has lost ~20# over 6 months - repeat CT abdom/pelvis postponed due to worsening renal function -reconsider appropriateness 5/22  Significantly elevated D-dimer 14.6 at time of presentation - CTa w/o evidence of PE - COVID testing negative - venous duplex bilateral lower extremities without evidence of DVT  Possible esophageal thickening Noted on CT chest December 2020 with recommendation for outpatient EGD - barium esophagram December 2020 without evidence of esophageal leak or esophageal mass  Slightly dilated pancreatic and common bile ducts Noted per CT chest/abdomen December 2020 with recommendation for outpatient MRCP or ERCP  Chronic diarrhea  Was evaluated by [facility] May 2021 - placed on pancrease 36K U TID, cholestyramine 4g BID, and prn imodium at that time - outpt f/u in 2-3 months was the D/C  recommendation - resumed these meds as inpatient but pt now reports constipation -discontinue these medications and follow patient with gentle laxative use  Equivocal UA UA is remarkable for a large number of white blood cells as  well as leukocytes but pt w/ no specific UTI sx - follow w/o abx for now   Hypomagnesemia Magnesium 1.2 at presentation -continue to correct with supplementation  Major depression Per Dr. [place] "was seen by psychiatry on 09/14/2019; Prozac was changed to mirtazapine to assist with appetite stimulation.  Did recommend outpatient provider to consider tapering of clonazepam to decrease symptoms of weakness and fatigue"  HTN Avoid overaggressive correction of blood pressure at this time  HLD Continue home medical therapy  Malnutrition of moderate degree   DVT prophylaxis: SCDs Code Status: NO CODE BLUE Family Communication: Spoke with husband at length at bedside Status is: Inpatient  Remains inpatient appropriate because:Ongoing diagnostic testing needed not appropriate for outpatient work up   Dispo: The patient is from: Home              Anticipated d/c is to: [facility] at this time              Anticipated d/c date is: 2 days              Patient currently is not medically stable to d/c.   Consultants:  Stroke team  Objective: Blood pressure (!) 165/83, pulse 77, temperature 98.1 F (36.7 C), temperature source Oral, resp. rate 20, SpO2 97 %.  Intake/Output Summary (Last 24 hours) at 09/23/2019 0718 Last data filed at 09/23/2019 0300 Gross per 24 hour  Intake 1896.68 ml  Output --  Net 1896.68 ml   There were no vitals filed for this visit.  Examination: General: No acute respiratory distress -alert and much more awake in presence of husband Lungs: Fine diffuse crackles bilaterally -no wheezing Cardiovascular: Regular rate and rhythm  Abdomen: Thin, soft, bowel sounds positive, no rebound Extremities: No significant edema B LE  CBC: Recent Labs  Lab 09/20/19 2013 09/20/19 2013 09/21/19 0821 09/22/19 0204 09/23/19 0423  WBC 10.6*   < > 17.0* 13.5* 12.5*  NEUTROABS 8.2*  --   --   --   --   HGB 7.1*   < > 8.8* 8.2* 8.0*  HCT 23.5*   < > 28.5* 25.8* 25.7*   MCV 92.9   < > 90.5 88.4 89.5  PLT 313   < > 282 274 265   < > = values in this interval not displayed.   Basic Metabolic Panel: Recent Labs  Lab 09/20/19 2013 09/21/19 0226 09/22/19 0204 09/23/19 0423  NA 141  --  140 140  K 4.7  --  4.6 4.2  CL 113*  --  111 112*  CO2 22  --  21* 21*  GLUCOSE 95  --  104* 105*  BUN 22  --  21 24*  CREATININE 1.00  --  1.40* 1.19*  CALCIUM 8.8*  --  7.8* 8.2*  MG  --  1.2* 1.9 1.6*  PHOS  --  2.7  --   --    GFR: Estimated Creatinine Clearance: 31.8 mL/min (A) (by C-G formula based on SCr of 1.19 mg/dL (H)).  Liver Function Tests: Recent Labs  Lab 09/21/19 0115 09/22/19 0204 09/23/19 0423  AST 43* 25 26  ALT 39 27 22  ALKPHOS 94 96 127*  BILITOT 0.9 0.8 0.9  PROT 5.3* 4.8* 4.6*  ALBUMIN 1.6* 1.4* 1.2*  Coagulation Profile: Recent Labs  Lab 09/20/19 2013  INR 1.2    Cardiac Enzymes: Recent Labs  Lab 09/21/19 0226  CKTOTAL 40    HbA1C: Hgb A1c MFr Bld  Date/Time Value Ref Range Status  09/21/2019 02:45 AM 5.3 4.8 - 5.6 % Final    Comment:    (NOTE)         Prediabetes: 5.7 - 6.4         Diabetes: >6.4         Glycemic control for adults with diabetes: <7.0   09/12/2019 03:47 AM 5.7 (H) 4.8 - 5.6 % Final    Comment:    (NOTE) Pre diabetes:          5.7%-6.4% Diabetes:              >6.4% Glycemic control for   <7.0% adults with diabetes     CBG: Recent Labs  Lab 09/20/19 1936 09/22/19 2034  GLUCAP 93 98    Recent Results (from the past 240 hour(s))  C Difficile Quick Screen w PCR reflex     Status: None   Collection Time: 09/13/19 11:50 AM   Specimen: STOOL  Result Value Ref Range Status   C Diff antigen NEGATIVE NEGATIVE Final   C Diff toxin NEGATIVE NEGATIVE Final   C Diff interpretation No C. difficile detected.  Final    Comment: Performed at [facility] Lab, [address] [place], [place] [address]  SARS Coronavirus 2 by RT PCR (hospital order, performed in [facility] hospital lab)  Nasopharyngeal Nasopharyngeal Swab     Status: None   Collection Time: 09/21/19  1:36 AM   Specimen: Nasopharyngeal Swab  Result Value Ref Range Status   SARS Coronavirus 2 NEGATIVE NEGATIVE Final    Comment: (NOTE) SARS-CoV-2 target nucleic acids are NOT DETECTED. The SARS-CoV-2 RNA is generally detectable in upper and lower respiratory specimens during the acute phase of infection. The lowest concentration of SARS-CoV-2 viral copies this assay can detect is 250 copies / mL. A negative result does not preclude SARS-CoV-2 infection and should not be used as the sole basis for treatment or other patient management decisions.  A negative result may occur with improper specimen collection / handling, submission of specimen other than nasopharyngeal swab, presence of viral mutation(s) within the areas targeted by this assay, and inadequate number of viral copies (<250 copies / mL). A negative result must be combined with clinical observations, patient history, and epidemiological information. Fact Sheet for Patients:   BoilerBrush.com.cy Fact Sheet for Healthcare Providers: https://pope.com/ This test is not yet approved or cleared  by the [place] FDA and has been authorized for detection and/or diagnosis of SARS-CoV-2 by FDA under an Emergency Use Authorization (EUA).  This EUA will remain in effect (meaning this test can be used) for the duration of the COVID-19 declaration under Section 564(b)(1) of the Act, 21 U.S.C. section 360bbb-3(b)(1), unless the authorization is terminated or revoked sooner. Performed at [facility], [address], [place], [place] [address]      Scheduled Meds: .  stroke: mapping our early stages of recovery book   Does not apply Once  . aspirin  325 mg Oral Daily  . atorvastatin  40 mg Oral q1800  . cholestyramine  4 g Oral BID  . clonazePAM  1 mg Oral BID  . feeding supplement (ENSURE  ENLIVE)  237 mL Oral TID BM  . feeding supplement (PRO-STAT SUGAR FREE 64)  30 mL Oral BID  . ferrous sulfate  325 mg Oral BID WC  . folic acid  1 mg Oral Daily  . lipase/protease/amylase  36,000 Units Oral TID WC  . mirtazapine  15 mg Oral QHS  . multivitamin with minerals  1 tablet Oral Daily  . ondansetron  4 mg Oral TID AC  . pantoprazole  40 mg Oral BID AC   Continuous Infusions: . sodium chloride 75 mL/hr at 09/23/19 0206  . magnesium sulfate bolus IVPB       LOS: 2 days   Lonia Blood, MD [facility] Office  [phone] Pager - Text Page per Amion  If 7PM-7AM, please contact night-coverage per Amion 09/23/2019, 7:18 AM

## 2019-09-23 NOTE — Evaluation (Signed)
Speech Language Pathology Evaluation Patient Details Name: Patricia Stanley MRN: [id number] DOB: 04/02/1946 Today's Date: 09/23/2019 Time: 1025-8527 SLP Time Calculation (min) (ACUTE ONLY): 14 min  Problem List:  Patient Active Problem List   Diagnosis Date Noted  . Obstipation 09/21/2019  . CVA (cerebral vascular accident) (HCC) 09/21/2019  . Symptomatic anemia 09/21/2019  . Generalized body aches 09/21/2019  . Bronchiectasis (HCC) 09/21/2019  . Palliative care by specialist   . Goals of care, counseling/discussion   . DNR (do not resuscitate)   . Anemia 09/12/2019  . Physical deconditioning 09/12/2019  . Malnutrition of moderate degree 09/12/2019  . Diarrhea 09/11/2019  . Moderate episode of recurrent major depressive disorder (HCC)   . Small vessel cerebrovascular accident (CVA) (HCC) 04/25/2019  . Acute CVA (cerebrovascular accident) (HCC) 04/18/2019  . Weight loss, unintentional 04/18/2019  . Anxiety 04/18/2019  . Major depression in partial remission (HCC) 08/25/2012  . Osteoporosis, unspecified 08/25/2012  . HTN (hypertension) 08/25/2012  . Fall at home 08/24/2012  . Pelvic fracture (HCC) 08/24/2012  . Migraines 08/24/2012  . Renal insufficiency, mild 08/24/2012   Past Medical History:  Past Medical History:  Diagnosis Date  . Closed fracture of coccyx (HCC) 2015   AFTER FALL  . Depression   . GERD (gastroesophageal reflux disease)   . Headache    MIGRAINES OCCASIONAL  . History of kidney stones    HAS NOW  . Hypertension    Past Surgical History:  Past Surgical History:  Procedure Laterality Date  . ABDOMINAL HYSTERECTOMY     1 OVARY LEFT  . APPENDECTOMY  AGE 38   DONE WITH OVARY REMOVAL  . COLONOSCOPY WITH PROPOFOL N/A 12/03/2015   Procedure: COLONOSCOPY WITH PROPOFOL;  Surgeon: Charolett Bumpers, MD;  Location: [facility];  Service: Endoscopy;  Laterality: N/A;  . ENDARTERECTOMY Right 04/21/2019   Procedure: ENDARTERECTOMY CAROTID;  Surgeon: Cephus Shelling, MD;  Location: [facility] OR;  Service: Vascular;  Laterality: Right;  . ESOPHAGOGASTRODUODENOSCOPY (EGD) WITH PROPOFOL N/A 12/03/2015   Procedure: ESOPHAGOGASTRODUODENOSCOPY (EGD) WITH PROPOFOL;  Surgeon: Charolett Bumpers, MD;  Location: [facility];  Service: Endoscopy;  Laterality: N/A;  . TONSILLECTOMY  AGE 16   AND ADENOIDS REMOVED   HPI:  Pt is a 74 y.o. female with medical history significant of CVA in December, carotid artery stenosis status post right CEA 04/2019,depression, GERD, kidney stones, HTN, anemia, Chronic diarrhea who presented to the ED with left arm numbness and left leg weakness for a few days (worsening of chronic symptoms). Pt also with recent admit for adult failure to thrive. MRI revealed 8 mm acute ischemic left basal ganglia CVA.    Assessment / Plan / Recommendation Clinical Impression  Pt participated in speech/language evaluation with her husband present. Both parties denied any acute changes but the pt stated that she has been having some difficulty with completion of tasks such as medication management and with attention since the prior CVA. Her cognitive-linguistic skills were marked by difficulty in memory, attention, and executive function which pt's husband indicated was her baseline. Speech and language skills were [facility] but additional processing time was needed during receptive language tasks due to impaired attention and memory. Further skilled SLP services are not clinically indicated at this time. Pt, nursing, and her husband were educated regarding results and recommendations; all parties verbalized understanding as well as agreement with plan of care.    SLP Assessment  SLP Recommendation/Assessment: Patient does not need any further Speech [facility] Pathology Services  SLP Visit Diagnosis: Cognitive communication deficit (R41.841)    Follow Up Recommendations  None    Frequency and Duration           SLP Evaluation Cognition  Overall  Cognitive Status: History of cognitive impairments - at baseline Arousal/Alertness: Awake/alert Orientation Level: Oriented X4 Attention: Focused;Sustained Focused Attention: Impaired Focused Attention Impairment: Verbal complex Sustained Attention: Impaired Sustained Attention Impairment: Verbal complex Memory: Impaired Memory Impairment: Retrieval deficit;Storage deficit;Decreased recall of new information Awareness: Impaired Awareness Impairment: Intellectual impairment Problem Solving: Appears intact(For basic problem solving related to safety)       Comprehension  Auditory Comprehension Overall Auditory Comprehension: Appears within functional limits for tasks assessed Yes/No Questions: Impaired Basic Biographical Questions: (5/5) Complex Questions: (4/5) Commands: Impaired Two Step Basic Commands: (4/4) Multistep Basic Commands: (1/3) Conversation: Complex Interfering Components: Attention;Processing speed;Working Field seismologist: Extra processing time;Repetition    Expression Expression Primary Mode of Expression: Verbal Verbal Expression Overall Verbal Expression: Appears within functional limits for tasks assessed Initiation: No impairment Automatic Speech: Day of week;Counting;Month of year(With repetition and self-correction) Level of Generative/Spontaneous Verbalization: Conversation Repetition: No impairment Naming: No impairment Pragmatics: No impairment Interfering Components: Attention   Oral / Motor  Oral Motor/Sensory Function Overall Oral Motor/Sensory Function: Within functional limits Motor Speech Overall Motor Speech: Appears within functional limits for tasks assessed(Mild imprecision but functional and reported at baseline) Respiration: Within functional limits Phonation: Normal Resonance: Within functional limits Articulation: Within functional limitis Intelligibility: Intelligible Motor Planning: Witnin functional limits Motor  Speech Errors: Not applicable   Shenandoah Vandergriff I. Hardin Negus, [place], [place] Office number [phone] Pager [place] 09/23/2019, 4:28 PM

## 2019-09-23 NOTE — Progress Notes (Addendum)
STROKE TEAM PROGRESS NOTE   INTERVAL HISTORY Husband at bedside. Pt sitting in chair. This am had some SOB and CCM consulted, on O2, concerning aspiration causing b/l interstitial pneumonitis on CT chest.  However, speech evaluation today did not have much concerned.  As per husband, patient has no more choking on eating.  Patient still very lethargic, left hand and wrist weakness continued and persist.  PT/OT recommend CIR.  Creatinine from 1.4 down to 1.19, will repeat CT abdomen pelvis with contrast.  Autoimmune labs pending.  Vitals:   09/23/19 0406 09/23/19 0748 09/23/19 1205 09/23/19 1211  BP: (!) 165/83 (!) 149/75 118/62 140/77  Pulse: 77 74 82 88  Resp: '20 18 18 18  ' Temp: 98.1 F (36.7 C) 98 F (36.7 C) 98.1 F (36.7 C) 98.4 F (36.9 C)  TempSrc: Oral Oral Oral Oral  SpO2: 97% 96% 92% 97%    CBC:  Recent Labs  Lab 09/20/19 2013 09/21/19 0821 09/22/19 0204 09/23/19 0423  WBC 10.6*   < > 13.5* 12.5*  NEUTROABS 8.2*  --   --   --   HGB 7.1*   < > 8.2* 8.0*  HCT 23.5*   < > 25.8* 25.7*  MCV 92.9   < > 88.4 89.5  PLT 313   < > 274 265   < > = values in this interval not displayed.    Basic Metabolic Panel:  Recent Labs  Lab 09/20/19 2013 09/21/19 0226 09/22/19 0204 09/23/19 0423  NA   < >  --  140 140  K   < >  --  4.6 4.2  CL   < >  --  111 112*  CO2   < >  --  21* 21*  GLUCOSE   < >  --  104* 105*  BUN   < >  --  21 24*  CREATININE   < >  --  1.40* 1.19*  CALCIUM   < >  --  7.8* 8.2*  MG   < > 1.2* 1.9 1.6*  PHOS  --  2.7  --   --    < > = values in this interval not displayed.   Lipid Panel:     Component Value Date/Time   CHOL 59 09/21/2019 0226   TRIG 53 09/21/2019 0226   HDL 20 (L) 09/21/2019 0226   CHOLHDL 3.0 09/21/2019 0226   VLDL 11 09/21/2019 0226   LDLCALC 28 09/21/2019 0226   HgbA1c:  Lab Results  Component Value Date   HGBA1C 5.3 09/21/2019   Urine Drug Screen: No results found for: LABOPIA, COCAINSCRNUR, LABBENZ, AMPHETMU, THCU,  LABBARB  Alcohol Level     Component Value Date/Time   ETH <10 04/18/2019 1234    IMAGING past 24 hours No results found.  PHYSICAL EXAM     Temp:  [97.6 F (36.4 C)-99.9 F (37.7 C)] 98.4 F (36.9 C) (05/21 1211) Pulse Rate:  [70-88] 88 (05/21 1211) Resp:  [18-20] 18 (05/21 1211) BP: (118-166)/(62-84) 140/77 (05/21 1211) SpO2:  [92 %-99 %] 97 % (05/21 1211)  General - Cachectic, well developed, lethargic.  Ophthalmologic - fundi not visualized due to noncooperation.  Cardiovascular - Regular rhythm and rate.  Mental Status -  Level of arousal and orientation to time, place, and person were intact. Language including expression, naming, repetition, comprehension was assessed and found intact. Mild to moderate dysarthria, psychomotor slowing  Cranial Nerves II - XII - II - Visual field intact OU.  III, IV, VI - Extraocular movements intact. V - Facial sensation intact bilaterally. VII - Facial movement intact bilaterally. VIII - Hearing & vestibular intact bilaterally. X - Palate elevates symmetrically. XI - Chin turning & shoulder shrug intact bilaterally. XII - Tongue protrusion intact.  Motor Strength - The patient's strength was normal in RUE and RLE, however, LUE proximal 4/5 but distal with wrist and finger movement 0/5. LLE 4/5 proximal and 2/5 ankle and toe DF/PF.  Bulk was decreased and fasciculations were absent.   Motor Tone - Muscle tone was assessed at the neck and appendages and was normal.  Reflexes - The patient's reflexes were symmetrica 1+l in all extremities and she had no pathological reflexes.  Sensory - Light touch, temperature/pinprick were assessed and were symmetrical except bilateral anterior hand decreased sensation, L>R.    Coordination - The patient had normal movements in the hands with no ataxia or dysmetria, but slow bilaterally.  Tremor was absent.  Gait and Station - deferred.   ASSESSMENT/PLAN Ms. OLUWASEUN BRUYERE is a 74 y.o.  female with history of CVA in December 2020 with residual left-sided deficits, HLD, HTN, carotid artery stenosis status post right CEA 04/2019, failure to thrive and anemia, discharged from [facility] last Friday after admission for persistent diarrhea, worsening weakness, anemia and severe deconditioning, who presents today via EMS with worsening of her left sided weakness.    Stroke:  Multifocal punctate anterior and posterior infarcts, embolic pattern, source unclear  CT head No acute abnormality. Small focal L cerebellar hypodensity. Small vessel disease. Atrophy. Multiple old B lacunes.   MRI  L CR infarct w/ petechial hemorrhage, R high frontal SO, L cerebellar and left PCA territory punctate infarcts. Small vessel disease. Atrophy. Old lacunes white matter and pons. Multiple scattered microhemorrhages.  CTA head & neck R CEA patent. Mild stenosis proximal L CCA. Moderate L V4 stenosis. Moderate B MCA stenosis.  2D Echo EF 55-60%. No source of embolus. LA dilated  LE venous doppler no DVT  LDL 28  HgbA1c 5.7  D-dimer 14.59  SCDs for VTE prophylaxis  aspirin 325 mg daily prior to admission, now on aspirin 325 mg daily. No DAPT due to severe anemia needing PRBC  Therapy recommendations:  CIR  Disposition:  pending   Weight loss and FTT  Has lost about 15 to 20 pound for the last 6 months  Husband considered chronic diarrhea is the cause -5/9-5/14 admitted for diarrhea, anemia and FTT - improving now  CTA chest did not show malignancy but enlarged lymph nodes likely reactive.   04/2019 CT abdomen pelvis negative for malignancy  04/2019 CT chest showed diffuse wall thickening of the lower third of the esophagus recommend outpatient EGD.  Recent chronic diarrhea, now improved -> constipated today  Has not follow-up with GI since last discharge 04/2019  Repeat CT abdomen pelvis - pending  Recommend GI consult  Anemia  Likely due to  nutritional  Hemoglobin 7.6-7.2-7.1-PRBC-8.8-8.2-8.0  Close monitoring  B12 471  FA 4.1 - on supplement  Homocysteine level pending  Iron panel Fe 21, TIBC 134, ferritin 1206->1056  Occult blood pending  Leukocytosis  WBC 11.7-12.8-10.8-17.0-13.5-12.5   ESR 29, and CRP 19.8  Ferritin 1206->1056  Autoimmune labs pending  Chest CT - bilateral pneumonia, pleural effusion, interstitial pneumonitis  On O2 for SOB episode  CCM on board  Will consider Abx if worsening  Hx stroke/TIA  04/2019 - right corona radiata infarct secondary to small vessel  disease in setting of large vessel disease source.  EF 60 to 65%.  LDL 87 and A1c 5.8.  Carotid Doppler right ICA 40 to 59% stenosis.  CTA head and neck right ICA plaque 65 to 70% stenosis.  DAPT x 3 mos then aspirin alone. R ICA stenosis->R CEA 04/2019.  Hypertension  Variable, up to 175/134 . Permissive hypertension (OK if <180/105) but gradually normalize in 3-5 days . Long-term BP goal normotensive  Hyperlipidemia  Home meds:  lipitor 80, resumed in hospital  LDL 28, goal < 70  Decrease Lipitor to 40   Continue statin at discharge  Other Stroke Risk Factors  Advanced age  Former Cigarette smoker, quit 24y ago  Migraines  Chronic diastolic CHF  Other Active Problems  FTT  Acute hypoxemic respiratory failure w/ marked progression on CT this admission c/w chronic PNA, concern for chronic aspiration.   Bilateral pleural effusions d/t malnutrition  Major depression  Malnutrition of moderate degree  Symptomatic anemia  Generalized body aches  Bronchiectasis vs atypical PNA  AKI 1.0->1.4->1.19  Hospital day # 2  I had long discussion with husband at bedside, updated pt current condition, treatment plan and potential prognosis, and answered all the questions. He expressed understanding and appreciation.    Rosalin Hawking, MD PhD Stroke Neurology 09/23/2019 2:19 PM  To contact Stroke Continuity  provider, please refer to [email or web address] After hours, contact General Neurology

## 2019-09-23 NOTE — Progress Notes (Signed)
Inpatient Rehab Admissions:  I met with pt and her husband at the bedside as follow up from PM&R consult. Please see consult note from PM&R MD Dr. Ranell Patrick for details. AC discussed goals and expectations of an inpatient rehab admission. Pt is familiar with our program as she was with [place] in December after her first CVA. Pt and her husband would like to pursue this program again and her husband has confirmed DC support. AC will begin insurance authorization process for possible admit.   Raechel Ache, OTR/L  Rehab Admissions Coordinator  [phone] 09/23/2019 2:39 PM

## 2019-09-23 NOTE — Progress Notes (Signed)
Physical Therapy Treatment Patient Details Name: Patricia Stanley MRN: [id number] DOB: 1946-02-08 Today's Date: 09/23/2019    History of Present Illness 74 y.o. female with medical history significant of CVA in December, carotid artery stenosis status post right CEA 04/2019,depression, GERD, kidney stones, HTN, anemia, Chronic diarrhea who presented to the ED with left arm numbness and left leg weakness for a few days (worsening of chronic symptoms). Pt also with recent admit for adult failure to thrive. MRI revealed 8 mm acute ischemic left basal ganglia CVA.     PT Comments    Patient continues to be very weak, lethargic, requires mod encouragement to participate with therapy. She requires mod/max assist for supine to sit. Poor sitting balance requiring external assist at all times to prevent falling to side or backward. She requires mod +2 or max or one for sit to stand and transfer to recliner/BSC. She will continue to benefit from skilled PT while here to improve strength and functional independence for return home with husband.      Follow Up Recommendations  CIR     Equipment Recommendations       Recommendations for Other Services       Precautions / Restrictions Precautions Precautions: Fall Precaution Comments: L side weakness, global weakness Restrictions Weight Bearing Restrictions: No    Mobility  Bed Mobility Overal bed mobility: Needs Assistance Bed Mobility: Supine to Sit     Supine to sit: Mod assist;Max assist Sit to supine: Max assist;+2 for physical assistance   General bed mobility comments: patient found to be wet in bed, required max assist to get from supine to sitting at edge of bed. unable to maintain sitting balance while changing gown without external support. Posterior leaning with assist needed to attain full upright position however lost balance flopping back on bed x3 requiring assist from PT to pull up to sitting  Transfers Overall transfer  level: Needs assistance Equipment used: 2 person hand held assist Transfers: Sit to/from [facility] Sit to Stand: Mod assist;+2 physical assistance Stand pivot transfers: Mod assist;+2 physical assistance       General transfer comment: HHA to transfer due to weakness and unable to maintain standing balance  Ambulation/Gait         Gait velocity: Decreased   General Gait Details: Patient has hyperextension on left, reports B feet numb. Difficulty maintaining standing balance, requires multimodal cues and physical assist for standing balance.   Stairs             Wheelchair Mobility    Modified Rankin (Stroke Patients Only) Modified Rankin (Stroke Patients Only) Pre-Morbid Rankin Score: Moderately severe disability Modified Rankin: Severe disability     Balance Overall balance assessment: Needs assistance Sitting-balance support: Feet supported Sitting balance-Leahy Scale: Zero Sitting balance - Comments: posterior leaning throughout sitting. Requiring assistance to bring her back up to neutral on several occasions.   Standing balance support: Bilateral upper extremity supported;During functional activity Standing balance-Leahy Scale: Poor Standing balance comment: reliant on external assist                            Cognition Arousal/Alertness: Lethargic Behavior During Therapy: Flat affect Overall Cognitive Status: Difficult to assess Area of Impairment: Attention;Memory;Following commands;Awareness;Problem solving                   Current Attention Level: Sustained Memory: Decreased short-term memory Following Commands: Follows one step commands with increased  time;Follows one step commands inconsistently   Awareness: Emergent Problem Solving: Slow processing;Requires verbal cues;Decreased initiation;Requires tactile cues General Comments: Pt again lethargic upon arrival, but improved as session continued, requires  increased time to understand cues however participatory      Exercises Other Exercises Other Exercises: seated B LE exercises: LAQ, marching x 10 reps    General Comments        Pertinent Vitals/Pain Pain Assessment: Faces Faces Pain Scale: Hurts a little bit Pain Location: generalized all over Pain Descriptors / Indicators: Grimacing;Guarding Pain Intervention(s): Limited activity within patient's tolerance;Monitored during session;Repositioned    Home Living                      Prior Function            PT Goals (current goals can now be found in the care plan section) Acute Rehab PT Goals Patient Stated Goal: to get stronger PT Goal Formulation: With patient/family Time For Goal Achievement: 10/26/2019 Potential to Achieve Goals: Fair Progress towards PT goals: Progressing toward goals    Frequency    Min 4X/week      PT Plan Current plan remains appropriate    Co-evaluation PT/OT/SLP Co-Evaluation/Treatment: Yes Reason for Co-Treatment: Complexity of the patient's impairments (multi-system involvement);For patient/therapist safety;To address functional/ADL transfers PT goals addressed during session: Mobility/safety with mobility;Balance OT goals addressed during session: ADL's and self-care;Strengthening/ROM      AM-PAC PT "6 Clicks" Mobility   Outcome Measure  Help needed turning from your back to your side while in a flat bed without using bedrails?: A Lot Help needed moving from lying on your back to sitting on the side of a flat bed without using bedrails?: A Lot Help needed moving to and from a bed to a chair (including a wheelchair)?: A Lot Help needed standing up from a chair using your arms (e.g., wheelchair or bedside chair)?: A Lot Help needed to walk in hospital room?: A Lot Help needed climbing 3-5 steps with a railing? : Total 6 Click Score: 11    End of Session Equipment Utilized During Treatment: Gait belt;Oxygen Activity  Tolerance: Patient tolerated treatment well;Patient limited by fatigue Patient left: in chair;with call bell/phone within reach;with chair alarm set;with family/visitor present Nurse Communication: Mobility status PT Visit Diagnosis: Unsteadiness on feet (R26.81);Muscle weakness (generalized) (M62.81);Difficulty in walking, not elsewhere classified (R26.2);Other abnormalities of gait and mobility (R26.89);Hemiplegia and hemiparesis;Pain Hemiplegia - Right/Left: Left Hemiplegia - dominant/non-dominant: Non-dominant Hemiplegia - caused by: Cerebral infarction     Time: 1330-1400 PT Time Calculation (min) (ACUTE ONLY): 30 min  Charges:  $Therapeutic Exercise: 8-22 mins                     Esraa Seres, PT, GCS 09/23/19,2:58 PM

## 2019-09-23 NOTE — Progress Notes (Signed)
Occupational Therapy Treatment Patient Details Name: Patricia Stanley MRN: [id number] DOB: 03/24/1946 Today's Date: 09/23/2019    History of present illness 74 y.o. female with medical history significant of CVA in December, carotid artery stenosis status post right CEA 04/2019,depression, GERD, kidney stones, HTN, anemia, Chronic diarrhea who presented to the ED with left arm numbness and left leg weakness for a few days (worsening of chronic symptoms). Pt also with recent admit for adult failure to thrive. MRI revealed 8 mm acute ischemic left basal ganglia CVA.    OT comments  Patient continues to make steady progress towards goals in skilled OT session. Patient's session encompassed co-treat with PT in order to progress towards functional independence in ADLs and mobility. Pt continues to demonstrate significant lethargy in movement, often falling back on bed requiring external assist. Pt required increased time and encouragement to complete ADLs EOB, but will complete. Pt continues to make an excellent candidate for CIR due to overall weakness, and solid family support to adhere to education once discharge; will continue to follow acutely.    Follow Up Recommendations  CIR;Supervision/Assistance - 24 hour    Equipment Recommendations  None recommended by OT    Recommendations for Other Services      Precautions / Restrictions Precautions Precautions: Fall Precaution Comments: L side weakness global weakness Restrictions Weight Bearing Restrictions: No       Mobility Bed Mobility Overal bed mobility: Needs Assistance Bed Mobility: Supine to Sit     Supine to sit: Max assist Sit to supine: Max assist;+2 for physical assistance   General bed mobility comments: patient found to be wet in bed, required max assist to get from supine to sitting at edge of bed. unable to maintain sitting balance while changing gown without external support. Posterior leaning with assist needed to  attain full upright position however lost balance flopping back on bed x3 requiring assist from PT to pull up to sitting  Transfers Overall transfer level: Needs assistance Equipment used: 2 person hand held assist Transfers: Sit to/from Stand Sit to Stand: Mod assist;+2 physical assistance Stand pivot transfers: Mod assist;+2 physical assistance       General transfer comment: HHA to transfer due to weakness and unable to maintain standing balance    Balance Overall balance assessment: Needs assistance Sitting-balance support: Feet supported Sitting balance-Leahy Scale: Zero Sitting balance - Comments: posterior leaning throughout sitting. Requiring assistance to bring her back up to neutral on several occasions.   Standing balance support: Bilateral upper extremity supported;During functional activity Standing balance-Leahy Scale: Poor Standing balance comment: reliant on external assist                           ADL either performed or assessed with clinical judgement   ADL Overall ADL's : Needs assistance/impaired     Grooming: Minimal assistance;Sitting;Wash/dry face;Brushing hair                   Toilet Transfer: Moderate assistance;+2 for physical assistance;Stand-pivot;BSC;Cueing for sequencing   Toileting- Clothing Manipulation and Hygiene: Maximal assistance;+2 for physical assistance;Sitting/lateral lean;Sit to/from stand;Total assistance Toileting - Clothing Manipulation Details (indicate cue type and reason): total to clean due to being unable to stand without therapist support     Functional mobility during ADLs: Moderate assistance;+2 for physical assistance General ADL Comments: Pt making progress, but still incredibly weak and requiring max encouragement     Vision  Perception     Praxis      Cognition Arousal/Alertness: Awake/alert;Lethargic Behavior During Therapy: Flat affect Overall Cognitive Status: Difficult to  assess Area of Impairment: Attention;Memory;Following commands;Awareness;Problem solving                   Current Attention Level: Sustained Memory: Decreased short-term memory Following Commands: Follows one step commands with increased time;Follows one step commands inconsistently   Awareness: Emergent Problem Solving: Slow processing;Requires verbal cues;Decreased initiation;Requires tactile cues General Comments: Pt again lethargic upon arrival, but improved as session continued, requires increased time to understand cues however participatory        Exercises     Shoulder Instructions       General Comments      Pertinent Vitals/ Pain       Pain Assessment: Faces Faces Pain Scale: Hurts little more Pain Location: generalized all over Pain Descriptors / Indicators: Aching;Grimacing;Restless Pain Intervention(s): Limited activity within patient's tolerance;Monitored during session;Repositioned  Home Living                                          Prior Functioning/Environment              Frequency  Min 2X/week        Progress Toward Goals  OT Goals(current goals can now be found in the care plan section)  Progress towards OT goals: Progressing toward goals  Acute Rehab OT Goals Patient Stated Goal: to get stronger OT Goal Formulation: With patient/family Time For Goal Achievement: 10/26/2019 Potential to Achieve Goals: Good  Plan Discharge plan remains appropriate    Co-evaluation    PT/OT/SLP Co-Evaluation/Treatment: Yes Reason for Co-Treatment: For patient/therapist safety;To address functional/ADL transfers   OT goals addressed during session: ADL's and self-care;Strengthening/ROM      AM-PAC OT "6 Clicks" Daily Activity     Outcome Measure   Help from another person eating meals?: A Little Help from another person taking care of personal grooming?: A Little Help from another person toileting, which includes using  toliet, bedpan, or urinal?: A Lot Help from another person bathing (including washing, rinsing, drying)?: A Lot Help from another person to put on and taking off regular upper body clothing?: A Lot Help from another person to put on and taking off regular lower body clothing?: A Lot 6 Click Score: 14    End of Session Equipment Utilized During Treatment: Oxygen  OT Visit Diagnosis: Hemiplegia and hemiparesis;Muscle weakness (generalized) (M62.81);Other abnormalities of gait and mobility (R26.89) Hemiplegia - Right/Left: Left Hemiplegia - dominant/non-dominant: Dominant Hemiplegia - caused by: Cerebral infarction   Activity Tolerance Patient tolerated treatment well   Patient Left in chair;with call bell/phone within reach;with chair alarm set;with family/visitor present   Nurse Communication Mobility status        Time: 1330-1400 OT Time Calculation (min): 30 min  Charges: OT General Charges $OT Visit: 1 Visit OT Treatments $Self Care/Home Management : 8-22 mins  Pollyann Glen E. Joanne Brander, COTA/L Acute Rehabilitation Services [phone]   Cherlyn Cushing 09/23/2019, 2:24 PM

## 2019-09-23 NOTE — Consult Note (Signed)
NAME:  Patricia Stanley, MRN:  [id number], DOB:  1945-12-28, LOS: 2 ADMISSION DATE:  09/20/2019, CONSULTATION DATE:  09/23/19 REFERRING MD:  Dr. Sharon Seller, CHIEF COMPLAINT:  Abnormal CT chest   Brief History   74 year old female with recent stroke admitted with worsening L sided weakness and found to have new strokes of embolic pattern. Course complicated by hypoxia and CT scan was abnormal with concerns for interstitial disease.   History of present illness   74 year old female with PMH as below, which is significant for admission for stroke in December of 2020. She was found to have carotid artery stenosis and underwent endarterectomy. She was discharged from that admission with some residual left sided deficits. She was then again admitted 5/9 > 5/14 with failure to thrive and chronic diarrhea. She was seen by GI who started Creon, cholestyramine, and imodium. Also had antidepressant changed to stimulate appetite. She again presented 5/18 from home with complaints of worsening left sided weakness. Imaging done in the ED demonstrated multifocal punctate anterior and posterior infarcts. She was admitted for neurology evaluation. Notably she was also newly hypoxic requiring 2L [place] to maintain O2 sats > 85%. CT imaging done demonstrating interstitial process. PCCM consulted.   She describes progressive dyspnea essentially since her stroke in December. Never had any cough or fever. Does not appreciate any trouble swallowing despite notes saying she struggled with breakfast on 5/20. Husband says this is rare. She is a former smoker, quit 20 years ago. She has not worked for 20 years either. She worked at [employer], but did not have any exposure to [employer] process. No dusts or chemicals. Currently she does get dyspneic with talking. Generally comfortable at rest.   Past Medical History   has a past medical history of Closed fracture of coccyx (HCC) (2015), Depression, GERD (gastroesophageal  reflux disease), Headache, History of kidney stones, and Hypertension.   Significant Hospital Events   5/9 > 5/14 admit for FTT.  5/18 admit with new strokes  Consults:  Neurology (stroke) Pulmonary  Procedures:    Significant Diagnostic Tests:  5/18 CT head > Negative for intracranial hemorrhage or territorial infarction. Small focal hypodensity in the left cerebellum, may reflect age indeterminate lacunar infarct. 5/18 MRI brain > 8 mm acute ischemic left basal ganglia infarct. Associated petechial hemorrhage without frank hemorrhagic transformation. Additional small volume subacute ischemic changes involving the high right frontal centrum semi ovale and left cerebellum as above. Multiple scattered chronic micro hemorrhages involving both cerebral and cerebellar hemispheres, most pronounced about the deep gray nuclei. CTA chest 5/19 > Multifocal airspace opacity consistent with pneumonia throughout the lungs bilaterally. Underlying degree of centrilobular emphysema. There is a degree of fibrosis, primarily in the lung bases as well as lower lobe bronchiectatic change.  Micro Data:  COVID negative  Antimicrobials:    Interim history/subjective:  As above  Objective   Blood pressure (!) 149/75, pulse 74, temperature 98 F (36.7 C), temperature source Oral, resp. rate 18, SpO2 96 %.        Intake/Output Summary (Last 24 hours) at 09/23/2019 1026 Last data filed at 09/23/2019 0300 Gross per 24 hour  Intake 1896.68 ml  Output --  Net 1896.68 ml   There were no vitals filed for this visit.  Examination: General: Frail elderly female in NAD HENT: [place]/AT, PERRL, no appreciable JVD Lungs: Scattered rales. Comfortable at rest on 2L.  Cardiovascular: RRR, no MRG Abdomen: Soft, non-tender, non-distended Extremities: No  acute deformity.  Neuro: Alert, oriented. Weakness to left upper and lower extremities.   Resolved Hospital Problem list     Assessment & Plan:   CVA:  acute multifocal punctate infarcts in the background of R MCA stoke in December of last year. Felt to be embolic in nature.  - Management per primary and Stroke service  Acute hypoxemic respiratory failure: seems to be progressive over the past few months. CT from this admission shows marked change vs progression from previous imaging in December. Consistent as well with atypical pneumonia. I would raise concern of chronic aspiration. There is evidence of bronchiectasis on prior imaging, seems stable.  - Continue supplemental O2 - SLP eval  - Send procalcitonin, consider ABX if markedly positive.  - Hold off on steroids. If speech workup unremarkable, would be reasonable to start.   Bilateral pleural effusion: small. Likely due to malnourishment. Albumin 1.  - If it turns out she has pneumonia we may need to sample the pleural fluid, otherwise conservative management.   Failure to thrive: appetite seems to be improving. She has been deemed a candidate for CIR.   Best practice:  Diet: Per primary Pain/Anxiety/Delirium protocol (if indicated): NA VAP protocol (if indicated): NA DVT prophylaxis: Per primary GI prophylaxis: Per primary Mobility: PT Code Status: DNR Family Communication: Patient and husband updated bedside Disposition: Med-surg  Labs   CBC: Recent Labs  Lab 09/20/19 2013 09/21/19 0821 09/22/19 0204 09/23/19 0423  WBC 10.6* 17.0* 13.5* 12.5*  NEUTROABS 8.2*  --   --   --   HGB 7.1* 8.8* 8.2* 8.0*  HCT 23.5* 28.5* 25.8* 25.7*  MCV 92.9 90.5 88.4 89.5  PLT 313 282 274 265    Basic Metabolic Panel: Recent Labs  Lab 09/20/19 2013 09/21/19 0226 09/22/19 0204 09/23/19 0423  NA 141  --  140 140  K 4.7  --  4.6 4.2  CL 113*  --  111 112*  CO2 22  --  21* 21*  GLUCOSE 95  --  104* 105*  BUN 22  --  21 24*  CREATININE 1.00  --  1.40* 1.19*  CALCIUM 8.8*  --  7.8* 8.2*  MG  --  1.2* 1.9 1.6*  PHOS  --  2.7  --   --    GFR: Estimated Creatinine Clearance:  31.8 mL/min (A) (by C-G formula based on SCr of 1.19 mg/dL (H)). Recent Labs  Lab 09/20/19 2013 09/21/19 0245 09/21/19 0821 09/22/19 0204 09/23/19 0423  PROCALCITON  --  0.17  --   --   --   WBC 10.6*  --  17.0* 13.5* 12.5*    Liver Function Tests: Recent Labs  Lab 09/21/19 0115 09/22/19 0204 09/23/19 0423  AST 43* 25 26  ALT 39 27 22  ALKPHOS 94 96 127*  BILITOT 0.9 0.8 0.9  PROT 5.3* 4.8* 4.6*  ALBUMIN 1.6* 1.4* 1.2*   No results for input(s): LIPASE, AMYLASE in the last 168 hours. No results for input(s): AMMONIA in the last 168 hours.  ABG No results found for: PHART, PCO2ART, PO2ART, HCO3, TCO2, ACIDBASEDEF, O2SAT   Coagulation Profile: Recent Labs  Lab 09/20/19 2013  INR 1.2    Cardiac Enzymes: Recent Labs  Lab 09/21/19 0226  CKTOTAL 40    HbA1C: Hgb A1c MFr Bld  Date/Time Value Ref Range Status  09/21/2019 02:45 AM 5.3 4.8 - 5.6 % Final    Comment:    (NOTE)  Prediabetes: 5.7 - 6.4         Diabetes: >6.4         Glycemic control for adults with diabetes: <7.0   09/12/2019 03:47 AM 5.7 (H) 4.8 - 5.6 % Final    Comment:    (NOTE) Pre diabetes:          5.7%-6.4% Diabetes:              >6.4% Glycemic control for   <7.0% adults with diabetes     CBG: Recent Labs  Lab 09/20/19 1936 09/22/19 2034  GLUCAP 93 98    Review of Systems:   Bolds are positive  Constitutional: weight loss, gain, night sweats, Fevers, chills, fatigue .  HEENT: headaches, Sore throat, sneezing, nasal congestion, post nasal drip, Difficulty swallowing, Tooth/dental problems, visual complaints visual changes, ear ache CV:  chest pain, radiates:,Orthopnea, PND, swelling in lower extremities, dizziness, palpitations, syncope.  GI  heartburn, indigestion, abdominal pain, nausea, vomiting, diarrhea, change in bowel habits, loss of appetite, bloody stools.  Resp: cough, productive:, hemoptysis, dyspnea, chest pain, pleuritic.  Skin: rash or itching or  icterus GU: dysuria, change in color of urine, urgency or frequency. flank pain, hematuria  MS: joint pain or swelling. decreased range of motion  Psych: change in mood or affect. depression or anxiety.  Neuro: difficulty with speech, weakness, numbness, ataxia    Past Medical History  She,  has a past medical history of Closed fracture of coccyx (HCC) (2015), Depression, GERD (gastroesophageal reflux disease), Headache, History of kidney stones, and Hypertension.   Surgical History    Past Surgical History:  Procedure Laterality Date  . ABDOMINAL HYSTERECTOMY     1 OVARY LEFT  . APPENDECTOMY  AGE 31   DONE WITH OVARY REMOVAL  . COLONOSCOPY WITH PROPOFOL N/A 12/03/2015   Procedure: COLONOSCOPY WITH PROPOFOL;  Surgeon: Charolett Bumpers, MD;  Location: [facility];  Service: Endoscopy;  Laterality: N/A;  . ENDARTERECTOMY Right 04/21/2019   Procedure: ENDARTERECTOMY CAROTID;  Surgeon: Cephus Shelling, MD;  Location: [facility] OR;  Service: Vascular;  Laterality: Right;  . ESOPHAGOGASTRODUODENOSCOPY (EGD) WITH PROPOFOL N/A 12/03/2015   Procedure: ESOPHAGOGASTRODUODENOSCOPY (EGD) WITH PROPOFOL;  Surgeon: Charolett Bumpers, MD;  Location: [facility]COPY;  Service: Endoscopy;  Laterality: N/A;  . TONSILLECTOMY  AGE 46   AND ADENOIDS REMOVED     Social History   reports that she quit smoking about 24 years ago. She has never used smokeless tobacco. She reports that she does not drink alcohol or use drugs.   Family History   Her family history includes Hyperlipidemia in her father and mother; Hypertension in her father and mother.   Allergies Allergies  Allergen Reactions  . Antihistamines, Chlorpheniramine-Type Other (See Comments)    Makes her heart race  . Paroxetine Other (See Comments)    Shakes-tremors     Home Medications  Prior to Admission medications   Medication Sig Start Date End Date Taking? Authorizing Provider  aspirin 325 MG tablet Take 1 tablet (325 mg total) by mouth  daily. 04/26/19  Yes Swayze, Ava, DO  atenolol (TENORMIN) 50 MG tablet Take 1 tablet (50 mg total) by mouth every evening. 05/09/19  Yes Angiulli, Mcarthur Rossetti, PA-C  atorvastatin (LIPITOR) 80 MG tablet Take 1 tablet (80 mg total) by mouth daily at 6 PM. 05/09/19  Yes Angiulli, Mcarthur Rossetti, PA-C  cholestyramine (QUESTRAN) 4 g packet Take 1 packet (4 g total) by mouth 2 (two) times daily. 09/16/19 12/15/19  Yes [place] Donnamarie Poag, DO  clonazePAM (KLONOPIN) 1 MG tablet 1 tab every a.m. and 2 tabs nightly Patient taking differently: Take 1 mg by mouth in the morning and at bedtime.  05/09/19  Yes Angiulli, Lavon Paganini, PA-C  ferrous sulfate 325 (65 FE) MG tablet Take 1 tablet (325 mg total) by mouth 2 (two) times daily with a meal. 09/16/19 12/15/19 Yes [place] Donnamarie Poag, DO  lipase/protease/amylase (CREON) 36000 UNITS CPEP capsule Take 1 capsule (36,000 Units total) by mouth 3 (three) times daily with meals. 09/16/19 12/15/19 Yes [place] Eric J, DO  lisinopril (ZESTRIL) 10 MG tablet Take 1 tablet (10 mg total) by mouth daily. 05/09/19  Yes Angiulli, Lavon Paganini, PA-C  loperamide (IMODIUM) 2 MG capsule Take 1 capsule (2 mg total) by mouth as needed for diarrhea or loose stools. 09/16/19  Yes [place] Eric J, DO  mirtazapine (REMERON) 15 MG tablet Take 1 tablet (15 mg total) by mouth at bedtime. 09/16/19 12/15/19 Yes [place] Eric J, DO  ondansetron (ZOFRAN) 4 MG tablet Take 1 tablet (4 mg total) by mouth 3 (three) times daily before meals. 09/16/19 12/15/19 Yes [place] Eric J, DO  pantoprazole (PROTONIX) 40 MG tablet Take 1 tablet (40 mg total) by mouth 2 (two) times daily before a meal. 05/09/19  Yes [place], Lavon Paganini, PA-C      Georgann Housekeeper, AGACNP-BC [place] for personal pager PCCM on call pager [phone]  09/23/2019 12:35 PM

## 2019-09-23 NOTE — PMR Pre-admission (Addendum)
PMR Admission Coordinator Pre-Admission Assessment  Patient: Patricia Stanley is an 74 y.o., female MRN: [id number] DOB: 04/20/1946 Height:   Weight:                Insurance Information HMO: yes    PPO:      PCP:      IPA:      80/20:      OTHER:  PRIMARY: [employer]      Policy#: [id number]      Subscriber: patient CM Name: Lattie Haw      Phone#: [phone]     Fax#: [phone] Pre-Cert#: [id number]      Employer:  Josem Kaufmann ([id number]) provided by Lattie Haw for admit to CIR. Admit date 09/27/19. Clinicals are due to [employer] weekly (p): [phone] [phone] (f): [phone] Benefits:  Phone #: online at [email or web address]     Name: transaction ID #[id number] Eff. Date: 05/06/19-05/04/20     Deduct: does not have for in-network providers      Out of Pocket Max: $3,900 ($430 met)      Life Max: NA  CIR: $295/day co-pay for days 1-6, $0/day co-pay for days 7-90      SNF: $0/day co-pay for days 1-20, $184/day co-pay for days 21-100; limited to 100 days/cal yr Outpatient: $10-$40/visit co-pay pending service; visits limited by medical necessity      Home Health: 100% coverage, 0% co-insurance, limited by medical necessity DME: 80% coverage, 20% co-insurance      Providers:  SECONDARY: None      Policy#:       Phone#:   Development worker, community:       Phone#:   The Engineer, [employer]" for patients in Inpatient Rehabilitation Facilities with attached "Privacy Act [place] Records" was provided and verbally reviewed with: Patient and Family  Emergency Contact Information Contact Information    Name Relation Home Work [place] Spouse [phone]  [phone]   Charlyne Petrin Daughter [phone]  [phone]     Current Medical History  Patient Admitting Diagnosis: Embolic infarcts, multifocal, anterior and posterior  History of Present Illness:  Blythe Veach is a 74 year old female history of CVA December 2020 with residual left-sided  weakness and received inpatient rehab services 04/25/2019 to 05/10/2019 and maintained on aspirin, hyperlipidemia, chronic normocytic anemia, hypertension, depression, chronic diarrhea, carotid artery stenosis with CEA 04/2019.  Per chart review patient lives with spouse.  1 level home 6 steps to entry.  Ambulates with a rolling walker but needed some additional support over the past month due to failure to thrive.  Presented 09/20/2019 with increased left arm weakness and numbness as well as left leg weakness with associated dyspnea and unintentional weight loss.  Cranial CT scan negative for intracranial hemorrhage or territorial infarct.  Small focal hypodensity in the left cerebellum reflecting possible indeterminate lacunar infarction.  Patient did not receive TPA.  MRI showed 8 mm acute ischemic left basal ganglia infarction.  Associated petechial hemorrhage without frank hemorrhagic transformation.  Multiple scattered chronic microhemorrhages involving both cerebral and cerebellar hemispheres most pronounced about the deep gray nuclei.  MRI cervical spine degenerative disc osteophyte C4-5 with resultant moderate spinal stenosis.  CT angiogram of the chest showed no demonstration of pulmonary emboli.  CT angiogram head and neck right carotid enterectomy widely patent.  No significant stenosis in the left carotid bifurcation.  Negative for intracranial large vessel occlusion.  CT abdomen pelvis showed no hydronephrosis, moderate colonic stool burden otherwise unremarkable.  Echocardiogram  with ejection fraction of 60% no wall motion abnormalities.  Noted admission chemistries hemoglobin 7.1 as well as hemoglobin 7.2 09/16/2019, WBC 10.6, chloride 113, BUN 22, urinalysis negative nitrite, troponin 33, SARS coronavirus negative, sedimentation rate 29.  Venous Doppler studies negative for DVT.  Maintained on aspirin 325 mg daily for CVA prophylaxis.  No DAPT due to anemia.  Her latest hemoglobin was 7.3.   Patient was  transfused 1 unit of packed red blood cells.  Patient had recently been seen by Dr. Paulita Fujita for gastroenterology services for history of anemia did not feel any work-up was needed as she appeared to be stable hemoglobin over the last couple of years.  Patient not felt to be a candidate at this time for endoscopy or colonoscopy.  Therapy evaluations completed and patient is to be admitted for a comprehensive rehab program on 09/27/19.   Complete NIHSS TOTAL: 3 Glasgow Coma Scale Score: 15  Past Medical History  Past Medical History:  Diagnosis Date  . Closed fracture of coccyx ([place]) 2015   AFTER FALL  . Depression   . GERD (gastroesophageal reflux disease)   . Headache    MIGRAINES OCCASIONAL  . History of kidney stones    HAS NOW  . Hypertension     Family History  family history includes Hyperlipidemia in her father and mother; Hypertension in her father and mother.  Prior Rehab/Hospitalizations:  Has the patient had prior rehab or hospitalizations prior to admission? Yes  Has the patient had major surgery during 100 days prior to admission? No  Current Medications   Current Facility-Administered Medications:  .  acetaminophen (TYLENOL) tablet 650 mg, 650 mg, Oral, Q4H PRN, 650 mg at 09/21/19 0131 **OR** [DISCONTINUED] acetaminophen (TYLENOL) 160 MG/5ML solution 650 mg, 650 mg, Per Tube, Q4H PRN **OR** [DISCONTINUED] acetaminophen (TYLENOL) suppository 650 mg, 650 mg, Rectal, Q4H PRN, Doutova, Anastassia, MD .  aspirin tablet 325 mg, 325 mg, Oral, Daily, Doutova, Anastassia, MD, 325 mg at 09/27/19 0955 .  atorvastatin (LIPITOR) tablet 40 mg, 40 mg, Oral, q1800, Rosalin Hawking, MD, 40 mg at 09/26/19 1848 .  bisacodyl (DULCOLAX) suppository 10 mg, 10 mg, Rectal, Daily PRN, Cherene Altes, MD .  clonazePAM Bobbye [place]) tablet 0.5 mg, 0.5 mg, Oral, BID, Cherene Altes, MD, 0.5 mg at 09/27/19 1001 .  feeding supplement (ENSURE ENLIVE) (ENSURE ENLIVE) liquid 237 mL, 237 mL, Oral, TID  BM, Doutova, Anastassia, MD, 237 mL at 09/26/19 2000 .  feeding supplement (PRO-STAT SUGAR FREE 64) liquid 30 mL, 30 mL, Oral, BID, Doutova, Anastassia, MD, 30 mL at 09/27/19 1000 .  ferrous sulfate tablet 325 mg, 325 mg, Oral, BID WC, Doutova, Anastassia, MD, 325 mg at 09/27/19 0958 .  folic acid (FOLVITE) tablet 1 mg, 1 mg, Oral, Daily, Cherene Altes, MD, 1 mg at 09/27/19 0958 .  HYDROcodone-acetaminophen (NORCO/VICODIN) 5-325 MG per tablet 1 tablet, 1 tablet, Oral, Q6H PRN, Cherene Altes, MD, 1 tablet at 09/26/19 2209 .  lactulose (CHRONULAC) 10 GM/15ML solution 20 g, 20 g, Oral, BID PRN, Cherene Altes, MD, 20 g at 09/27/19 0955 .  mirtazapine (REMERON) tablet 15 mg, 15 mg, Oral, QHS, Doutova, Anastassia, MD, 15 mg at 09/26/19 2115 .  multivitamin with minerals tablet 1 tablet, 1 tablet, Oral, Daily, Doutova, Anastassia, MD, 1 tablet at 09/27/19 1000 .  ondansetron (ZOFRAN) tablet 4 mg, 4 mg, Oral, TID AC, Cherene Altes, MD, 4 mg at 09/27/19 0955 .  pantoprazole (PROTONIX) EC tablet 40 mg,  40 mg, Oral, BID AC, Doutova, Anastassia, MD, 40 mg at 09/27/19 0958 .  senna-docusate (Senokot-S) tablet 1 tablet, 1 tablet, Oral, BID, Cherene Altes, MD, 1 tablet at 09/27/19 1001 .  traMADol (ULTRAM) tablet 50 mg, 50 mg, Oral, Q6H PRN, Doutova, Anastassia, MD, 50 mg at 09/27/19 1001  Patients Current Diet:  Diet Order            Diet regular Room service appropriate? Yes; Fluid consistency: Thin  Diet effective now              Precautions / Restrictions Precautions Precautions: Fall Precaution Comments: L side weakness, global weakness Restrictions Weight Bearing Restrictions: No Other Position/Activity Restrictions: weak LUE grip   Has the patient had 2 or more falls or a fall with injury in the past year?Yes  Prior Activity Level Household: limited since last CVA (Dec/Jan); CGA for ambulation and up to Min A for ADLs. Used RW for ambulation. recently got wc due to  recent onset of weakness.   Prior Functional Level Prior Function Level of Independence: Needs assistance Gait / Transfers Assistance Needed: ambulates with RW and has needed additional support last month ADL's / Homemaking Assistance Needed: since her CVA spouse has been providing some assist for ADL  Self Care: Did the patient need help bathing, dressing, using the toilet or eating?  Needed some help  Indoor Mobility: Did the patient need assistance with walking from room to room (with or without device)? Needed some help  Stairs: Did the patient need assistance with internal or external stairs (with or without device)? Needed some help  Functional Cognition: Did the patient need help planning regular tasks such as shopping or remembering to take medications? Needed some help  Home Assistive Devices / Equipment Home Equipment: Walker - 2 wheels, Bedside commode, Shower seat, Grab bars - tub/shower, Hand held shower head  Prior Device Use: Indicate devices/aids used by the patient prior to current illness, exacerbation or injury? Walker and has recently obtained a wc  Current Functional Level Cognition  Arousal/Alertness: Awake/alert Overall Cognitive Status: History of cognitive impairments - at baseline Difficult to assess due to: Hard of hearing/deaf, Level of arousal Current Attention Level: Selective, Sustained Orientation Level: Oriented to person, Oriented to place, Disoriented to situation, Disoriented to time Following Commands: Follows one step commands inconsistently, Follows one step commands with increased time General Comments: pt was seen for mobility with dense cues for managmeent of LUE and use of RUE to support standing Attention: Focused, Sustained Focused Attention: Impaired Focused Attention Impairment: Verbal complex Sustained Attention: Impaired Sustained Attention Impairment: Verbal complex Memory: Impaired Memory Impairment: Retrieval deficit, Storage  deficit, Decreased recall of new information Awareness: Impaired Awareness Impairment: Intellectual impairment Problem Solving: Appears intact(For basic problem solving related to safety)    Extremity Assessment (includes Sensation/Coordination)  Upper Extremity Assessment: Defer to OT evaluation LUE Deficits / Details: grossly 3/5 shoulder and elbow strength, 1/5 wrist and significantly weak gross grasp  LUE Sensation: decreased light touch, decreased proprioception LUE Coordination: decreased fine motor, decreased gross motor  Lower Extremity Assessment: LLE deficits/detail, Generalized weakness LLE Deficits / Details: Grossly 2/5 for ankle PF; 3/5 at hip and knee flexors. Decreased sensation from knee down to foot.  LLE Sensation: decreased light touch LLE Coordination: decreased gross motor    ADLs  Overall ADL's : Needs assistance/impaired Eating/Feeding: Set up, Sitting Grooming: Minimal assistance, Sitting, Wash/dry face, Brushing hair Upper Body Bathing: Moderate assistance, Sitting Lower Body Bathing: Maximal assistance, +  2 for physical assistance, Sitting/lateral leans, Sit to/from stand Upper Body Dressing : Moderate assistance, Sitting Lower Body Dressing: Maximal assistance, +2 for physical assistance, Sitting/lateral leans, Sit to/from stand Toilet Transfer: Moderate assistance, +2 for physical assistance, Stand-pivot, BSC, Cueing for sequencing Toilet Transfer Details (indicate cue type and reason): MOD A +1 stand pivot EOB<>BSC Toileting- Clothing Manipulation and Hygiene: Total assistance, Sit to/from stand Toileting - Clothing Manipulation Details (indicate cue type and reason): total A for posterior pericare in standing Functional mobility during ADLs: Moderate assistance, +2 for physical assistance, Rolling walker, Cueing for safety, Cueing for sequencing General ADL Comments: pt continues to present with generalized weakness, decreased activity tolerance and  decreased LUE FMC and strength impacting pts ability to engage in ADLs    Mobility  Overal bed mobility: Needs Assistance Bed Mobility: Supine to Sit Rolling: Mod assist Supine to sit: Mod assist, +2 for physical assistance, +2 for safety/equipment Sit to supine: Mod assist, +2 for physical assistance, +2 for safety/equipment General bed mobility comments: agreed to sit on side of bed and chair, with cues for postural control both sitting and standing    Transfers  Overall transfer level: Needs assistance Equipment used: Rolling walker (2 wheeled), 2 person hand held assist Transfers: Sit to/from Stand, Stand Pivot Transfers Sit to Stand: Mod assist, +2 safety/equipment, +2 physical assistance, From elevated surface Stand pivot transfers: Mod assist, +2 physical assistance, +2 safety/equipment, From elevated surface General transfer comment: MOD A for stand pivot transfer from EOB<>BSC with hand held assist. Pt completed second trial with MOD A +2 with RW needing assist manage LUE on RW    Ambulation / Gait / Stairs / Wheelchair Mobility  Ambulation/Gait Ambulation/Gait assistance: Mod assist, +2 physical assistance, +2 safety/equipment Gait Distance (Feet): 5 Feet Assistive device: Rolling walker (2 wheeled) Gait Pattern/deviations: Step-to pattern, Decreased stride length, Wide base of support, Trunk flexed, Decreased weight shift to left General Gait Details: standing and mobility in standing with mod assist and dense cues for controlling buckling on LLE Gait velocity: Decreased Gait velocity interpretation: <1.8 ft/sec, indicate of risk for recurrent falls    Posture / Balance Dynamic Sitting Balance Sitting balance - Comments: 2 person help for control of balance and functional mobility Balance Overall balance assessment: Needs assistance Sitting-balance support: Feet supported, Bilateral upper extremity supported Sitting balance-Leahy Scale: Fair Sitting balance - Comments: 2  person help for control of balance and functional mobility Standing balance support: Bilateral upper extremity supported, During functional activity Standing balance-Leahy Scale: Poor Standing balance comment: reliant on external assist    Special needs/care consideration Continuous Drip IV: none Oxygen: 2L [place] History of bowel incontinence Designated visitor: husband Fielding (from acute therapy documentation) Living Arrangements: Spouse/significant other, Children  Lives With: Spouse, Daughter Available Help at Discharge: Family, Available 24 hours/day Type of Home: House Home Layout: One level Home Access: Stairs to enter Entrance Stairs-Rails: Left, Right Entrance Stairs-Number of Steps: 6 Bathroom Shower/Tub: Multimedia programmer: Standard  Discharge Living Setting Plans for Discharge Living Setting: Patient's home, Lives with (comment)(lives with husband and grown daugther ) Type of Home at Discharge: House Discharge Home Layout: One level Discharge Home Access: Stairs to enter Entrance Stairs-Rails: Right, Left Entrance Stairs-Number of Steps: 6 (in back); 4 in front (mostly uses back steps though) Discharge Bathroom Shower/Tub: Tub/shower unit(with tub transfer bench) Discharge Bathroom Toilet: Handicapped height Discharge Bathroom Accessibility: Yes How Accessible: Accessible via walker Does the patient  have any problems obtaining your medications?: No  Social/Family/Support Systems Patient Roles: Spouse Contact Information: [name] (husband): cell [phone]; home: [phone] Anticipated Caregiver: husband and daughter Anticipated Caregiver's Contact Information: see above Ability/Limitations of Caregiver: Min A Caregiver Availability: 24/7 Discharge Plan Discussed with Primary Caregiver: Yes(pt and her husband) Is Caregiver In Agreement with Plan?: Yes Does Caregiver/Family have Issues with Lodging/Transportation while Pt  is in Rehab?: No   Goals Patient/Family Goal for Rehab: PT/OT/SLP: Min A Expected length of stay: 10-14 days Cultural Considerations: NA Pt/Family Agrees to Admission and willing to participate: Yes Program Orientation Provided & Reviewed with Pt/Caregiver Including Roles  & Responsibilities: Yes(pt and her husband)  Barriers to Discharge: Home environment access/layout, Incontinence, New oxygen  Barriers to Discharge Comments: steps to enter home; new O2 on this admission   Decrease burden of Care through IP rehab admission: NA   Possible need for SNF placement upon discharge: Not anticipated; pt has great family support and has gone [facility] prior with good outcomes.    Patient Condition: This patient's medical and functional status has changed since the consult dated: 09/23/19 in which the Rehabilitation Physician determined and documented that the patient's condition is appropriate for intensive rehabilitative care in an inpatient rehabilitation facility. See "History of Present Illness" (above) for medical update. Functional changes are: improved ambulation distance from Mod A +2 for 4 feet to Mod A +2 5 feet . Patient's medical and functional status update has been discussed with the Rehabilitation physician and patient remains appropriate for inpatient rehabilitation. Will admit to inpatient rehab today, 09/27/19.  Preadmission Screen Completed By:  Raechel Ache, OT, 09/27/2019 11:08 AM ______________________________________________________________________   Discussed status with Dr. Dagoberto Ligas on 09/27/19 at 11:00AM and received approval for admission today.  Admission Coordinator:  Raechel Ache, time 11:00AM/Date 09/27/19.

## 2019-09-24 ENCOUNTER — Inpatient Hospital Stay (HOSPITAL_COMMUNITY): Payer: Medicare HMO

## 2019-09-24 LAB — COMPREHENSIVE METABOLIC PANEL
ALT: 20 U/L (ref 0–44)
AST: 30 U/L (ref 15–41)
Albumin: 1.3 g/dL — ABNORMAL LOW (ref 3.5–5.0)
Alkaline Phosphatase: 132 U/L — ABNORMAL HIGH (ref 38–126)
Anion gap: 8 (ref 5–15)
BUN: 30 mg/dL — ABNORMAL HIGH (ref 8–23)
CO2: 24 mmol/L (ref 22–32)
Calcium: 8.5 mg/dL — ABNORMAL LOW (ref 8.9–10.3)
Chloride: 106 mmol/L (ref 98–111)
Creatinine, Ser: 1.41 mg/dL — ABNORMAL HIGH (ref 0.44–1.00)
GFR calc Af Amer: 43 mL/min — ABNORMAL LOW (ref >60)
GFR calc non Af Amer: 37 mL/min — ABNORMAL LOW (ref >60)
Glucose, Bld: 106 mg/dL — ABNORMAL HIGH (ref 70–99)
Potassium: 4.1 mmol/L (ref 3.5–5.1)
Sodium: 138 mmol/L (ref 135–145)
Total Bilirubin: 0.4 mg/dL (ref 0.3–1.2)
Total Protein: 4.7 g/dL — ABNORMAL LOW (ref 6.5–8.1)

## 2019-09-24 LAB — CBC
HCT: 25.1 % — ABNORMAL LOW (ref 36.0–46.0)
Hemoglobin: 7.9 g/dL — ABNORMAL LOW (ref 12.0–15.0)
MCH: 28.1 pg (ref 26.0–34.0)
MCHC: 31.5 g/dL (ref 30.0–36.0)
MCV: 89.3 fL (ref 80.0–100.0)
Platelets: 218 10*3/uL (ref 150–400)
RBC: 2.81 MIL/uL — ABNORMAL LOW (ref 3.87–5.11)
RDW: 19.4 % — ABNORMAL HIGH (ref 11.5–15.5)
WBC: 11.9 10*3/uL — ABNORMAL HIGH (ref 4.0–10.5)
nRBC: 0 % (ref 0.0–0.2)

## 2019-09-24 LAB — MAGNESIUM: Magnesium: 1.9 mg/dL (ref 1.7–2.4)

## 2019-09-24 MED ORDER — IOHEXOL 300 MG/ML  SOLN
100.0000 mL | Freq: Once | INTRAMUSCULAR | Status: AC | PRN
  Administered 2019-09-24: 100 mL via INTRAVENOUS

## 2019-09-24 NOTE — Progress Notes (Signed)
Patricia Stanley  TIW:[id number] DOB: [birth year] DOA: 09/20/2019 PCP: Josetta Huddle, MD    Brief Narrative:  74 year old with history of CVA December 2020, carotid artery stenosis status post right CEA December 2020, depression, GERD, kidney stones, HTN, chronic diarrhea, and chronic anemia who presented to the ED with a left arm numbness and left leg weakness for a few days.  This represents a worsening of her chronic symptoms dating back to December 2020.  The patient has recently undergone an evaluation for failure to thrive and was found to have chronic diarrhea with GI initiating Creon, cholestyramine, and Imodium.  She was felt to be too frail for endoscopic evaluation at that time.  Significant Events: 04/2019 hospitalized for R MCA CVA leading to CEA 5/9 > 5/14 admit to [facility] w/ FTT - ongoing diarrhea, weight loss, FTT 5/18 admit via [facility] ED w/ L arm and leg weakness  5/20 venous duplex w/o evidence of DVT 5/20 TTE -EF 55-60% with no WMA -RV moderately enlarged with moderately elevated PA systolic pressure at 45 -LA moderately dilated -RA mildly dilated -moderate pulmonic regurg  Antimicrobials:  None  Subjective: Resting comfortably in bed. No family present at time of my visit. No acute distress.   Assessment & Plan:  Multifocal punctate anterior and posterior infarcts, embolic pattern MRI noted L CR infarct w/ petechial hemorrhage, R high frontal SO, L cerebellar and left PCA territory punctate infarcts, small vessel disease, atrophy, and old lacunes white matter and pons w/ multiple scattered microhemorrhages - CTa head/neck noted patent R CEA and mild stenosis prox L CCA - LDL 28 - A1c 5.7 - ASA 325mg  only for now (no DAPT due to anemia requiring tx)  Acute hypoxic respiratory failure - bronchiectasis versus atypical pneumonia v/s recurrent aspiration  Patient requiring 2 L nasal cannula to keep sats greater than 85% -lung hyperexpansion and chronic bronchitic changes noted on  CXR December 2020 -suggestion of probable chronic interstitial lung disease via CT chest December 2020 - CTa this admit notes what appears to be interstitial pulmonary disease - Pulm has seen and requested SLP eval (which noted no aspiration at time of eval) - to f/u w/ Pulm in outpt setting w/ f/u HRCT chest - ?hydralazine associated lupus - short trial of diuresis has been administered   Chronic normocytic anemia  Hemoglobin 7.1 at presentation but was 7.2 Sep 16, 2019 -hemoglobin improved as expected with transfusion of 1 unit PRBC this admission -suspect this is anemia of chronic disease - Albumin is profoundly low as is TIBC -accordingly iron levels are low as well -B12 is acceptable but folate is actually below goal -supplement with folic acid   Acute kidney injury Creatinine continues to fluctuate -over the last 24 hours patient has received diuretic and IV contrast -will need to continue to monitor trend  Unintentional Weight Loss / FTT Has lost ~20# over 6 months -no findings on CT abdomen pelvis to explain this -perhaps this is related to hydralazine induced lupus type reaction -monitor  Significantly elevated D-dimer 14.6 at time of presentation - CTa w/o evidence of PE - COVID testing negative - venous duplex bilateral lower extremities without evidence of DVT  Possible esophageal thickening Noted on CT chest December 2020 with recommendation for outpatient EGD - barium esophagram December 2020 without evidence of esophageal leak or esophageal mass  Slightly dilated pancreatic and common bile ducts Noted per CT chest/abdomen December 2020 with recommendation for outpatient MRCP or ERCP -no similar findings on CT  abdomen this admission  Chronic diarrhea  Was evaluated by [facility] May 2021 - placed on pancrease 36K U TID, cholestyramine 4g BID, and prn imodium at that time - outpt f/u in 2-3 months was the D/C recommendation - resumed these meds as inpatient but pt now reports  constipation -discontinued these medications   Equivocal UA UA is remarkable for a large number of white blood cells as well as leukocytes but pt w/ no specific UTI sx - follow w/o abx for now   Hypomagnesemia Magnesium 1.2 at presentation -corrected to goal  Major depression Per Dr. [place] "was seen by psychiatry on 09/14/2019; Prozac was changed to mirtazapine to assist with appetite stimulation.  Did recommend outpatient provider to consider tapering of clonazepam to decrease symptoms of weakness and fatigue"  HTN Blood pressure currently controlled  HLD Continue home medical therapy  Malnutrition of moderate degree   DVT prophylaxis: SCDs Code Status: NO CODE BLUE Family Communication: no family present  Status is: Inpatient  Remains inpatient appropriate because:Unsafe d/c plan   Dispo: The patient is from: Home              Anticipated d/c is to: [facility]              Anticipated d/c date is: 2 days              Patient currently is not medically stable to d/c.  Consultants:  Stroke team  Objective: Blood pressure 134/74, pulse 78, temperature (!) 97.3 F (36.3 C), temperature source Oral, resp. rate 18, SpO2 97 %.  Intake/Output Summary (Last 24 hours) at 09/24/2019 0900 Last data filed at 09/24/2019 0300 Gross per 24 hour  Intake 1264.88 ml  Output 2300 ml  Net -1035.12 ml   There were no vitals filed for this visit.  Examination: General: No acute distress - somnolent  Lungs: Fine diffuse crackles bilaterally w/o change  Cardiovascular: Regular rate and rhythm w/o M  Abdomen: Thin, soft, bowel sounds positive, no rebound Extremities: No significant edema B lower extremities   CBC: Recent Labs  Lab 09/20/19 2013 09/21/19 0821 09/22/19 0204 09/23/19 0423 09/24/19 0326  WBC 10.6*   < > 13.5* 12.5* 11.9*  NEUTROABS 8.2*  --   --   --   --   HGB 7.1*   < > 8.2* 8.0* 7.9*  HCT 23.5*   < > 25.8* 25.7* 25.1*  MCV 92.9   < > 88.4 89.5 89.3  PLT 313   < >  274 265 218   < > = values in this interval not displayed.   Basic Metabolic Panel: Recent Labs  Lab 09/20/19 2013 09/21/19 0226 09/22/19 0204 09/23/19 0423 09/24/19 0326  NA   < >  --  140 140 138  K   < >  --  4.6 4.2 4.1  CL   < >  --  111 112* 106  CO2   < >  --  21* 21* 24  GLUCOSE   < >  --  104* 105* 106*  BUN   < >  --  21 24* 30*  CREATININE   < >  --  1.40* 1.19* 1.41*  CALCIUM   < >  --  7.8* 8.2* 8.5*  MG   < > 1.2* 1.9 1.6* 1.9  PHOS  --  2.7  --   --   --    < > = values in this interval not displayed.  GFR: Estimated Creatinine Clearance: 26.8 mL/min (A) (by C-G formula based on SCr of 1.41 mg/dL (H)).  Liver Function Tests: Recent Labs  Lab 09/21/19 0115 09/22/19 0204 09/23/19 0423 09/24/19 0326  AST 43* 25 26 30   ALT 39 27 22 20   ALKPHOS 94 96 127* 132*  BILITOT 0.9 0.8 0.9 0.4  PROT 5.3* 4.8* 4.6* 4.7*  ALBUMIN 1.6* 1.4* 1.2* 1.3*    Coagulation Profile: Recent Labs  Lab 09/20/19 2013  INR 1.2    Cardiac Enzymes: Recent Labs  Lab 09/21/19 0226  CKTOTAL 40    HbA1C: Hgb A1c MFr Bld  Date/Time Value Ref Range Status  09/21/2019 02:45 AM 5.3 4.8 - 5.6 % Final    Comment:    (NOTE)         Prediabetes: 5.7 - 6.4         Diabetes: >6.4         Glycemic control for adults with diabetes: <7.0   09/12/2019 03:47 AM 5.7 (H) 4.8 - 5.6 % Final    Comment:    (NOTE) Pre diabetes:          5.7%-6.4% Diabetes:              >6.4% Glycemic control for   <7.0% adults with diabetes     CBG: Recent Labs  Lab 09/20/19 1936 09/22/19 2034  GLUCAP 93 98    Recent Results (from the past 240 hour(s))  SARS Coronavirus 2 by RT PCR (hospital order, performed in [facility] hospital lab) Nasopharyngeal Nasopharyngeal Swab     Status: None   Collection Time: 09/21/19  1:36 AM   Specimen: Nasopharyngeal Swab  Result Value Ref Range Status   SARS Coronavirus 2 NEGATIVE NEGATIVE Final    Comment: (NOTE) SARS-CoV-2 target nucleic acids are  NOT DETECTED. The SARS-CoV-2 RNA is generally detectable in upper and lower respiratory specimens during the acute phase of infection. The lowest concentration of SARS-CoV-2 viral copies this assay can detect is 250 copies / mL. A negative result does not preclude SARS-CoV-2 infection and should not be used as the sole basis for treatment or other patient management decisions.  A negative result may occur with improper specimen collection / handling, submission of specimen other than nasopharyngeal swab, presence of viral mutation(s) within the areas targeted by this assay, and inadequate number of viral copies (<250 copies / mL). A negative result must be combined with clinical observations, patient history, and epidemiological information. Fact Sheet for Patients:   [facility] Fact Sheet for Healthcare Providers: 09/23/19 This test is not yet approved or cleared  by the [employer] FDA and has been authorized for detection and/or diagnosis of SARS-CoV-2 by FDA under an Emergency Use Authorization (EUA).  This EUA will remain in effect (meaning this test can be used) for the duration of the COVID-19 declaration under Section 564(b)(1) of the Act, 21 U.S.C. section 360bbb-3(b)(1), unless the authorization is terminated or revoked sooner. Performed at [facility], [address] [place], [address] [place]      Scheduled Meds: .  stroke: mapping our early stages of recovery book   Does not apply Once  . aspirin  325 mg Oral Daily  . atorvastatin  40 mg Oral q1800  . clonazePAM  1 mg Oral BID  . feeding supplement (ENSURE ENLIVE)  237 mL Oral TID BM  . feeding supplement (PRO-STAT SUGAR FREE 64)  30 mL Oral BID  . ferrous sulfate  325 mg  Oral BID WC  . folic acid  1 mg Oral Daily  . mirtazapine  15 mg Oral QHS  . multivitamin with minerals  1 tablet Oral Daily  . ondansetron  4 mg Oral TID AC  .  pantoprazole  40 mg Oral BID AC  . senna-docusate  1 tablet Oral BID   Continuous Infusions: . sodium chloride 50 mL/hr at 09/23/19 1835     LOS: 3 days   Lonia Blood, MD [facility] Office  [phone] Pager - Text Page per Loretha Stapler  If 7PM-7AM, please contact night-coverage per Amion 09/24/2019, 9:00 AM

## 2019-09-24 NOTE — Progress Notes (Signed)
Physical Therapy Treatment Patient Details Name: Patricia Stanley MRN: [id number] DOB: 09-Apr-1946 Today's Date: 09/24/2019    History of Present Illness 74 y.o. female with medical history significant of CVA in December, carotid artery stenosis status post right CEA 04/2019,depression, GERD, kidney stones, HTN, anemia, Chronic diarrhea who presented to the ED with left arm numbness and left leg weakness for a few days (worsening of chronic symptoms). Pt also with recent admit for adult failure to thrive. MRI revealed 8 mm acute ischemic left basal ganglia CVA.     PT Comments    Patient received sleeping in bed. Roused to voice and touch. Very lethargic this date. Minimally agrees to bed level exercises. Required assist to complete 10 reps of each. Easily falling back to sleep during session. Patient declines getting up to recliner states " I want to go back to sleep."  Patient will continue to benefit from skilled PT while here to improve strength and functional independence.        Follow Up Recommendations  CIR;SNF(depending on ability to participate)     Equipment Recommendations  None recommended by PT    Recommendations for Other Services       Precautions / Restrictions Precautions Precautions: Fall Restrictions Weight Bearing Restrictions: No    Mobility  Bed Mobility               General bed mobility comments: patient declined getting to chair this afternoon. Very lethargic, closing eyes and going back to sleep easily.  Transfers                 General transfer comment: not performed  Ambulation/Gait                 Stairs             Wheelchair Mobility    Modified Rankin (Stroke Patients Only) Modified Rankin (Stroke Patients Only) Pre-Morbid Rankin Score: Moderately severe disability Modified Rankin: Severe disability     Balance       Sitting balance - Comments: not assessed this date                                    Cognition Arousal/Alertness: Lethargic Behavior During Therapy: Flat affect Overall Cognitive Status: History of cognitive impairments - at baseline Area of Impairment: Awareness;Problem solving                   Current Attention Level: Sustained Memory: Decreased short-term memory Following Commands: Follows one step commands inconsistently;Follows one step commands with increased time   Awareness: Emergent Problem Solving: Slow processing;Requires verbal cues;Decreased initiation;Requires tactile cues General Comments: Patient appearing even more lethargic this visit. Sleeping most of the day. Declined working with me earlier and returned this pm.      Exercises Other Exercises Other Exercises: supine LE exercises BLE with assist: AP, heel slides, hip abd/add, SAQ x 10 reps each.    General Comments        Pertinent Vitals/Pain Pain Assessment: Faces Faces Pain Scale: Hurts little more Pain Location: generalized all over Pain Descriptors / Indicators: Discomfort Pain Intervention(s): Monitored during session    Home Living                      Prior Function            PT Goals (current goals can now be  found in the care plan section)      Frequency    Min 4X/week      PT Plan Current plan remains appropriate    Co-evaluation              AM-PAC PT "6 Clicks" Mobility   Outcome Measure  Help needed turning from your back to your side while in a flat bed without using bedrails?: A Lot Help needed moving from lying on your back to sitting on the side of a flat bed without using bedrails?: A Lot Help needed moving to and from a bed to a chair (including a wheelchair)?: Total Help needed standing up from a chair using your arms (e.g., wheelchair or bedside chair)?: Total Help needed to walk in hospital room?: Total Help needed climbing 3-5 steps with a railing? : Total 6 Click Score: 8    End of Session Equipment  Utilized During Treatment: Oxygen Activity Tolerance: Patient limited by lethargy Patient left: in bed;with bed alarm set;with call bell/phone within reach Nurse Communication: Mobility status PT Visit Diagnosis: Unsteadiness on feet (R26.81);Muscle weakness (generalized) (M62.81);Difficulty in walking, not elsewhere classified (R26.2);Other abnormalities of gait and mobility (R26.89);Hemiplegia and hemiparesis;Pain Hemiplegia - Right/Left: Left Hemiplegia - dominant/non-dominant: Non-dominant Hemiplegia - caused by: Cerebral infarction Pain - Right/Left: (all over)     Time: 1420-1430 PT Time Calculation (min) (ACUTE ONLY): 10 min  Charges:  $Therapeutic Exercise: 8-22 mins                     Kristyn Conetta, PT, GCS 09/24/19,2:41 PM

## 2019-09-24 NOTE — Progress Notes (Signed)
STROKE TEAM PROGRESS NOTE   INTERVAL HISTORY Pt lying in bed, sleeping. No family at bedside. Had CT abd/pelvis negative for malignancy. Autoimmune labs showed RF and CRP high, otherwise neg. PT/OT recommend CIR.   Vitals:   09/23/19 2024 09/23/19 2314 09/24/19 0348 09/24/19 0811  BP: (!) 152/69 139/70 137/76 134/74  Pulse: 80 88 83 78  Resp: _0 Temp: 97.6 F (36.4 C) 100.2 F (37.9 C) 97.9 F (36.6 C) (!) 97.3 F (36.3 C)  TempSrc: Oral Oral Oral Oral  SpO2: 93%   97%    CBC:  Recent Labs  Lab 09/20/19 2013 09/21/19 0821 09/23/19 0423 09/24/19 0326  WBC 10.6*   < > 12.5* 11.9*  NEUTROABS 8.2*  --   --   --   HGB 7.1*   < > 8.0* 7.9*  HCT 23.5*   < > 25.7* 25.1*  MCV 92.9   < > 89.5 89.3  PLT 313   < > 265 218   < > = values in this interval not displayed.    Basic Metabolic Panel:  Recent Labs  Lab 09/21/19 0226 09/22/19 0204 09/23/19 0423 09/24/19 0326  NA  --    < > 140 138  K  --    < > 4.2 4.1  CL  --    < > 112* 106  CO2  --    < > 21* 24  GLUCOSE  --    < > 105* 106*  BUN  --    < > 24* 30*  CREATININE  --    < > 1.19* 1.41*  CALCIUM  --    < > 8.2* 8.5*  MG 1.2*   < > 1.6* 1.9  PHOS 2.7  --   --   --    < > = values in this interval not displayed.   Lipid Panel:     Component Value Date/Time   CHOL 59 09/21/2019 0226   TRIG 53 09/21/2019 0226   HDL 20 (L) 09/21/2019 0226   CHOLHDL 3.0 09/21/2019 0226   VLDL 11 09/21/2019 0226   LDLCALC 28 09/21/2019 0226   HgbA1c:  Lab Results  Component Value Date   HGBA1C 5.3 09/21/2019   Urine Drug Screen: No results found for: LABOPIA, COCAINSCRNUR, LABBENZ, AMPHETMU, THCU, LABBARB  Alcohol Level     Component Value Date/Time   ETH <10 04/18/2019 1234    IMAGING past 24 hours  CT ABDOMEN PELVIS W CONTRAST  Result Date: 09/24/2019 CLINICAL DATA:  Weight loss EXAM: CT ABDOMEN AND PELVIS WITH CONTRAST TECHNIQUE: Multidetector CT imaging of the abdomen and pelvis was performed using the  standard protocol following bolus administration of intravenous contrast. CONTRAST:  63m OMNIPAQUE IOHEXOL 300 MG/ML  SOLN COMPARISON:  04/21/2019 FINDINGS: Lower chest: Subpleural reticulation/fibrosis at the lung bases, suggesting chronic interstitial lung disease. Small bilateral pleural effusions, new. Hepatobiliary: Liver is notable for a stable probable vascular malformation in the lateral segment left hepatic lobe (series 3/image 22). Gallbladder is unremarkable. No intrahepatic or extrahepatic ductal dilatation. Pancreas: Within normal limits. Spleen: Within normal limits. Adrenals/Urinary Tract: Adrenal glands are within normal limits. 5 mm nonobstructing left upper pole renal calculus (series 3/image 22). Right kidney is within normal limits. No hydronephrosis. Bladder is within normal limits. Stomach/Bowel: Stomach is within normal limits. No evidence of bowel obstruction. Appendix is not discretely visualized, reportedly surgically absent. Moderate colonic stool burden, suggesting constipation. Vascular/Lymphatic: No evidence of abdominal aortic aneurysm. Atherosclerotic  calcifications of the abdominal aorta and branch vessels. No suspicious abdominopelvic lymphadenopathy. Reproductive: Status post hysterectomy. Left ovary is within normal limits.  No right adnexal mass. Other: No abdominopelvic ascites. Mild body wall edema. Soft tissue calcifications in the visualized soft tissues, including the bilateral gluteal regions, chronic. Musculoskeletal: Mild superior endplate compression fracture deformity at T9, chronic. Lumbar spine is within normal limits. IMPRESSION: Moderate colonic stool burden, suggesting constipation. 5 mm nonobstructing left upper pole renal calculus. No hydronephrosis. Small bilateral pleural effusions. No CT findings to account for the patient's weight loss. Electronically Signed   By: Julian Hy M.D.   On: 09/24/2019 05:22    PHYSICAL EXAM   Temp:  [97.3 F (36.3  C)-100.2 F (37.9 C)] 97.3 F (36.3 C) (05/22 0811) Pulse Rate:  [77-88] 78 (05/22 0811) Resp:  [17-20] 18 (05/22 0811) BP: (134-152)/(69-76) 134/74 (05/22 0811) SpO2:  [92 %-97 %] 97 % (05/22 0811)  General - Cachectic, well developed, lethargic.  Ophthalmologic - fundi not visualized due to noncooperation.  Cardiovascular - Regular rhythm and rate.  Mental Status -  Level of arousal and orientation to time, place, and person were intact. Language including expression, naming, repetition, comprehension was assessed and found intact. Mild to moderate dysarthria, psychomotor slowing  Cranial Nerves II - XII - II - Visual field intact OU. III, IV, VI - Extraocular movements intact. V - Facial sensation intact bilaterally. VII - Facial movement intact bilaterally. VIII - Hearing & vestibular intact bilaterally. X - Palate elevates symmetrically. XI - Chin turning & shoulder shrug intact bilaterally. XII - Tongue protrusion intact.  Motor Strength - The patient's strength was normal in RUE and RLE, however, LUE proximal 4/5 but distal with wrist and finger movement 0/5. LLE 4/5 proximal and 2/5 ankle and toe DF/PF.  Bulk was decreased and fasciculations were absent.   Motor Tone - Muscle tone was assessed at the neck and appendages and was normal.  Reflexes - The patient's reflexes were symmetrica 1+l in all extremities and she had no pathological reflexes.  Sensory - Light touch, temperature/pinprick were assessed and were symmetrical except bilateral anterior hand decreased sensation, L>R.    Coordination - The patient had normal movements in the hands with no ataxia or dysmetria, but slow bilaterally.  Tremor was absent.  Gait and Station - deferred.   ASSESSMENT/PLAN Ms. SHABREKA COULON is a 74 y.o. female with history of CVA in December 2020 with residual left-sided deficits, HLD, HTN, carotid artery stenosis status post right CEA 04/2019, failure to thrive and anemia,  discharged from [facility] last Friday after admission for persistent diarrhea, worsening weakness, anemia and severe deconditioning, who presents today via EMS with worsening of her left sided weakness.    Stroke:  Multifocal punctate anterior and posterior infarcts, embolic pattern, source unclear. Could be related to reactive hypercoagulable state from infection  CT head No acute abnormality. Small focal L cerebellar hypodensity. Small vessel disease. Atrophy. Multiple old B lacunes.   MRI  L CR infarct w/ petechial hemorrhage, R high frontal SO, L cerebellar and left PCA territory punctate infarcts. Small vessel disease. Atrophy. Old lacunes white matter and pons. Multiple scattered microhemorrhages.  CTA head & neck R CEA patent. Mild stenosis proximal L CCA. Moderate L V4 stenosis. Moderate B MCA stenosis.  CT abd/pelvis - no malignancy  2D Echo EF 55-60%. No source of embolus. LA dilated  LE venous doppler no DVT  LDL 28  HgbA1c 5.7  D-dimer 14.59 - likely reactive to infection in the setting of FTT  SCDs for VTE prophylaxis  aspirin 325 mg daily prior to admission, now on aspirin 325 mg daily. No DAPT due to severe anemia needing PRBC  Therapy recommendations:  CIR  Disposition:  pending   Weight loss and FTT  Has lost about 15 to 20 pound for the last 6 months  Husband considered chronic diarrhea is the cause -5/9-5/14 admitted for diarrhea, anemia and FTT - improving now  CTA chest did not show malignancy but enlarged lymph nodes likely reactive.   04/2019 CT abdomen pelvis negative for malignancy  04/2019 CT chest showed diffuse wall thickening of the lower third of the esophagus recommend outpatient EGD.  CT abd/pel - 09/24/2019 - no malignancy  Recent chronic diarrhea, GI initiating Creon, cholestyramine, and Imodium - felt to be too frail for endoscopic evaluation -> now diarrhea improved -> constipation   Anemia  Symptomatic  anemia  Likely due to nutritional  Hemoglobin 7.6-7.2-7.1-PRBC-8.8-8.2-8.0->7.9  Close monitoring  B12 471  FA 4.1 - on supplement  Homocysteine level pending  Iron panel Fe 21, TIBC 134, ferritin 1206->1056  Occult blood pending  B/l pneumonia  Leukocytosis  WBC 11.7-12.8-10.8-17.0-13.5-12.5->11.9 (TMax Fri 100.2)  ESR 29, and CRP 19.8  Ferritin 1206->1056  Autoimmune labs neg except Rheumatoid Factor - 151.9 (H) - likely reactive to infection  CCP pending, histone Ab pending  Chest CT - bilateral pneumonia, pleural effusion, interstitial pneumonitis. Acute hypoxemic respiratory failure w/ marked progression on CT this admission c/w chronic PNA, concern for chronic aspiration.   Bilateral pleural effusions d/t malnutrition  Bronchiectasis vs atypical PNA  On O2 for SOB episode 5/21  CCM on board  May consider Abx if worsening  Hx stroke/TIA  04/2019 - right corona radiata infarct secondary to small vessel disease in setting of large vessel disease source.  EF 60 to 65%.  LDL 87 and A1c 5.8.  Carotid Doppler right ICA 40 to 59% stenosis.  CTA head and neck right ICA plaque 65 to 70% stenosis.  DAPT x 3 mos then aspirin alone. R ICA stenosis->R CEA 04/2019.  Hypertension  Stable now . Permissive hypertension (OK if <180/105) but gradually normalize in 3-5 days . Long-term BP goal normotensive  Hyperlipidemia  Home meds:  lipitor 80, resumed in hospital  LDL 28, goal < 70  Decrease Lipitor to 40   Continue statin at discharge  Other Stroke Risk Factors  Advanced age  Former Cigarette smoker, quit 24y ago  Migraines  Chronic diastolic CHF  Other Active Problems  FTT  Acute hypoxemic respiratory failure w/ marked progression on CT this admission c/w chronic PNA, concern for chronic aspiration.   Bilateral pleural effusions d/t malnutrition  Major depression  Malnutrition of moderate degree  Generalized body aches  AKI  1.0->1.4->1.19->1.41   Hospital day # 3  Rosalin Hawking, MD PhD Stroke Neurology 09/24/2019 5:25 PM  To contact Stroke Continuity provider, please refer to [email or web address] After hours, contact General Neurology

## 2019-09-25 LAB — CBC
HCT: 23.6 % — ABNORMAL LOW (ref 36.0–46.0)
Hemoglobin: 7.3 g/dL — ABNORMAL LOW (ref 12.0–15.0)
MCH: 28 pg (ref 26.0–34.0)
MCHC: 30.9 g/dL (ref 30.0–36.0)
MCV: 90.4 fL (ref 80.0–100.0)
Platelets: 208 10*3/uL (ref 150–400)
RBC: 2.61 MIL/uL — ABNORMAL LOW (ref 3.87–5.11)
RDW: 19.8 % — ABNORMAL HIGH (ref 11.5–15.5)
WBC: 9.6 10*3/uL (ref 4.0–10.5)
nRBC: 0 % (ref 0.0–0.2)

## 2019-09-25 LAB — BASIC METABOLIC PANEL
Anion gap: 4 — ABNORMAL LOW (ref 5–15)
BUN: 37 mg/dL — ABNORMAL HIGH (ref 8–23)
CO2: 25 mmol/L (ref 22–32)
Calcium: 8.3 mg/dL — ABNORMAL LOW (ref 8.9–10.3)
Chloride: 111 mmol/L (ref 98–111)
Creatinine, Ser: 1.33 mg/dL — ABNORMAL HIGH (ref 0.44–1.00)
GFR calc Af Amer: 46 mL/min — ABNORMAL LOW (ref >60)
GFR calc non Af Amer: 40 mL/min — ABNORMAL LOW (ref >60)
Glucose, Bld: 91 mg/dL (ref 70–99)
Potassium: 3.9 mmol/L (ref 3.5–5.1)
Sodium: 140 mmol/L (ref 135–145)

## 2019-09-25 LAB — MPO/PR-3 (ANCA) ANTIBODIES
ANCA Proteinase 3: <3.5 U/mL (ref 0.0–3.5)
Myeloperoxidase Abs: <9 U/mL (ref 0.0–9.0)

## 2019-09-25 MED ORDER — CLONAZEPAM 0.5 MG PO TABS
0.5000 mg | ORAL_TABLET | Freq: Two times a day (BID) | ORAL | Status: DC
  Administered 2019-09-25 – 2019-09-27 (×4): 0.5 mg via ORAL
  Filled 2019-09-25 (×4): qty 1

## 2019-09-25 MED ORDER — HYDROCODONE-ACETAMINOPHEN 5-325 MG PO TABS
1.0000 | ORAL_TABLET | Freq: Four times a day (QID) | ORAL | Status: DC | PRN
  Administered 2019-09-26: 1 via ORAL
  Filled 2019-09-25 (×2): qty 1

## 2019-09-25 MED ORDER — MAGNESIUM CITRATE PO SOLN
0.5000 | Freq: Once | ORAL | Status: AC
  Administered 2019-09-25: 0.5 via ORAL
  Filled 2019-09-25: qty 296

## 2019-09-25 NOTE — Progress Notes (Signed)
Patricia Stanley  YOV:[id number] DOB: [birth year] DOA: 09/20/2019 PCP: Marden Noble, MD    Brief Narrative:  74 year old with history of CVA December 2020, carotid artery stenosis status post right CEA December 2020, depression, GERD, kidney stones, HTN, chronic diarrhea, and chronic anemia who presented to the ED with a left arm numbness and left leg weakness for a few days.  This represents a worsening of her chronic symptoms dating back to December 2020.  The patient has recently undergone an evaluation for failure to thrive and was found to have chronic diarrhea with GI initiating Creon, cholestyramine, and Imodium.  She was felt to be too frail for endoscopic evaluation at that time.  Significant Events: 04/2019 hospitalized for R MCA CVA leading to CEA 5/9 > 5/14 admit to [facility] w/ FTT - ongoing diarrhea, weight loss, FTT 5/18 admit via [facility] ED w/ L arm and leg weakness  5/20 venous duplex w/o evidence of DVT 5/20 TTE -EF 55-60% with no WMA -RV moderately enlarged with moderately elevated PA systolic pressure at 45 -LA moderately dilated -RA mildly dilated -moderate pulmonic regurg  Antimicrobials:  None  Subjective: C/o feeling nauseous and weak in general. Denies cp or SOB. Reports generalized abdom discomfort which is crampy in nature. Has not yet had a bowel movement.  Tells me she desires to get home asap, and is willing to increase her participation in therapy.   RN has been making attempts to wean her O2, but she continues to desaturate into the mid 80s or even lower w/o support at this time.    Assessment & Plan:  Multifocal punctate anterior and posterior infarcts, embolic pattern MRI noted L CR infarct w/ petechial hemorrhage, R high frontal SO, L cerebellar and left PCA territory punctate infarcts, small vessel disease, atrophy, and old lacunes white matter and pons w/ multiple scattered microhemorrhages - CTa head/neck noted patent R CEA and mild stenosis prox L CCA - LDL  28 - A1c 5.7 - ASA 325mg  only for now (no DAPT due to anemia requiring tx)  Acute hypoxic respiratory failure - bronchiectasis versus atypical pneumonia v/s recurrent aspiration  Patient requiring 2 L nasal cannula to keep sats greater than 85% -lung hyperexpansion and chronic bronchitic changes noted on CXR December 2020 -suggestion of probable chronic interstitial lung disease via CT chest December 2020 - CTa this admit notes what appears to be interstitial pulmonary disease - Pulm has seen and requested SLP eval (which noted no aspiration at time of eval) - to f/u w/ Pulm in outpt setting w/ f/u HRCT chest - ?hydralazine associated lupus - short trial of diuresis has been administered - anticipate need for home O2 at dc   Chronic normocytic anemia  Hemoglobin 7.1 at presentation but was 7.2 Sep 16, 2019 -hemoglobin improved as expected with transfusion of 1 unit PRBC this admission -suspect this is anemia of chronic disease - Albumin is profoundly low as is TIBC -accordingly iron levels are low as well -B12 is acceptable but folate is actually below goal -supplementing with folic acid   Acute kidney injury Creatinine continues to fluctuate -over the last 24 hours patient has received diuretic and IV contrast -will need to continue to monitor trend - appears to be stabilizing for now   Unintentional Weight Loss / FTT Has lost ~20# over 6 months -no findings on CT abdomen pelvis to explain this -perhaps this is related to hydralazine induced lupus type reaction -monitor - frank discussion had w/ pt and wife  that she must get out of bed and break cycle of inactivity   Significantly elevated D-dimer 14.6 at time of presentation - CTa w/o evidence of PE - COVID testing negative - venous duplex bilateral lower extremities without evidence of DVT  Possible esophageal thickening Noted on CT chest December 2020 with recommendation for outpatient EGD - barium esophagram December 2020 without evidence of  esophageal leak or esophageal mass  Slightly dilated pancreatic and common bile ducts Noted per CT chest/abdomen December 2020 with recommendation for outpatient MRCP or ERCP - no similar findings on CT abdomen this admission  Chronic diarrhea  Was evaluated by [facility] May 2021 - placed on pancrease 36K U TID, cholestyramine 4g BID, and prn imodium at that time - outpt f/u in 2-3 months was the D/C recommendation - resumed these meds as inpatient but pt now reports ongoing constipation - discontinued these medications - increase bowel regimen  Equivocal UA UA is remarkable for a large number of white blood cells as well as leukocytes but pt w/ no specific UTI sx  Hypomagnesemia Magnesium 1.2 at presentation -corrected to goal  Major depression Per Dr. [place] "was seen by psychiatry on 09/14/2019; Prozac was changed to mirtazapine to assist with appetite stimulation.  Did recommend outpatient provider to consider tapering of clonazepam to decrease symptoms of weakness and fatigue" - wean klonopin to 0.5mg  starting today   HTN No change in tx plan today   HLD Continue home medical therapy  Malnutrition of moderate degree   DVT prophylaxis: SCDs Code Status: NO CODE BLUE Family Communication: spoke w/ husband at bedside  Status is: Inpatient  Remains inpatient appropriate because:Unsafe d/c plan   Dispo: The patient is from: Home              Anticipated d/c is to: CIR              Anticipated d/c date is: awaiting bed on CIR               Patient currently is not medically stable to d/c.  Consultants:  Stroke team  Objective: Blood pressure (!) 156/85, pulse 83, temperature 97.8 F (36.6 C), temperature source Oral, resp. rate 20, SpO2 99 %.  Intake/Output Summary (Last 24 hours) at 09/25/2019 7824 Last data filed at 09/25/2019 0011 Gross per 24 hour  Intake --  Output 1075 ml  Net -1075 ml   There were no vitals filed for this visit.  Examination: General: No  acute distress - flat affect - withdrawn Lungs: Fine diffuse crackles bilaterally in bases - no wheezing  Cardiovascular: Regular rate and rhythm - no rub  Abdomen: Thin, soft, bowel sounds positive, no rebound - diffusely tender  Extremities: No significant edema B LE   CBC: Recent Labs  Lab 09/20/19 2013 09/21/19 0821 09/23/19 0423 09/24/19 0326 09/25/19 0416  WBC 10.6*   < > 12.5* 11.9* 9.6  NEUTROABS 8.2*  --   --   --   --   HGB 7.1*   < > 8.0* 7.9* 7.3*  HCT 23.5*   < > 25.7* 25.1* 23.6*  MCV 92.9   < > 89.5 89.3 90.4  PLT 313   < > 265 218 208   < > = values in this interval not displayed.   Basic Metabolic Panel: Recent Labs  Lab 09/20/19 2013 09/21/19 2353 09/22/19 6144 09/22/19 0204 09/23/19 0423 09/24/19 0326 09/25/19 0416  NA   < >  --  140   < >  140 138 140  K   < >  --  4.6   < > 4.2 4.1 3.9  CL   < >  --  111   < > 112* 106 111  CO2   < >  --  21*   < > 21* 24 25  GLUCOSE   < >  --  104*   < > 105* 106* 91  BUN   < >  --  21   < > 24* 30* 37*  CREATININE   < >  --  1.40*   < > 1.19* 1.41* 1.33*  CALCIUM   < >  --  7.8*   < > 8.2* 8.5* 8.3*  MG   < > 1.2* 1.9  --  1.6* 1.9  --   PHOS  --  2.7  --   --   --   --   --    < > = values in this interval not displayed.   GFR: Estimated Creatinine Clearance: 28.4 mL/min (A) (by C-G formula based on SCr of 1.33 mg/dL (H)).  Liver Function Tests: Recent Labs  Lab 09/21/19 0115 09/22/19 0204 09/23/19 0423 09/24/19 0326  AST 43* 25 26 30   ALT 39 27 22 20   ALKPHOS 94 96 127* 132*  BILITOT 0.9 0.8 0.9 0.4  PROT 5.3* 4.8* 4.6* 4.7*  ALBUMIN 1.6* 1.4* 1.2* 1.3*    Coagulation Profile: Recent Labs  Lab 09/20/19 2013  INR 1.2    Cardiac Enzymes: Recent Labs  Lab 09/21/19 0226  CKTOTAL 40    HbA1C: Hgb A1c MFr Bld  Date/Time Value Ref Range Status  09/21/2019 02:45 AM 5.3 4.8 - 5.6 % Final    Comment:    (NOTE)         Prediabetes: 5.7 - 6.4         Diabetes: >6.4         Glycemic  control for adults with diabetes: <7.0   09/12/2019 03:47 AM 5.7 (H) 4.8 - 5.6 % Final    Comment:    (NOTE) Pre diabetes:          5.7%-6.4% Diabetes:              >6.4% Glycemic control for   <7.0% adults with diabetes     CBG: Recent Labs  Lab 09/20/19 1936 09/22/19 2034  GLUCAP 93 98    Recent Results (from the past 240 hour(s))  SARS Coronavirus 2 by RT PCR (hospital order, performed in [place] hospital lab) Nasopharyngeal Nasopharyngeal Swab     Status: None   Collection Time: 09/21/19  1:36 AM   Specimen: Nasopharyngeal Swab  Result Value Ref Range Status   SARS Coronavirus 2 NEGATIVE NEGATIVE Final    Comment: (NOTE) SARS-CoV-2 target nucleic acids are NOT DETECTED. The SARS-CoV-2 RNA is generally detectable in upper and lower respiratory specimens during the acute phase of infection. The lowest concentration of SARS-CoV-2 viral copies this assay can detect is 250 copies / mL. A negative result does not preclude SARS-CoV-2 infection and should not be used as the sole basis for treatment or other patient management decisions.  A negative result may occur with improper specimen collection / handling, submission of specimen other than nasopharyngeal swab, presence of viral mutation(s) within the areas targeted by this assay, and inadequate number of viral copies (<250 copies / mL). A negative result must be combined with clinical observations, patient history, and epidemiological information. Fact Sheet for  Patients:   BoilerBrush.com.cy Fact Sheet for Healthcare Providers: https://pope.com/ This test is not yet approved or cleared  by the [place] FDA and has been authorized for detection and/or diagnosis of SARS-CoV-2 by FDA under an Emergency Use Authorization (EUA).  This EUA will remain in effect (meaning this test can be used) for the duration of the COVID-19 declaration under Section 564(b)(1) of the  Act, 21 U.S.C. section 360bbb-3(b)(1), unless the authorization is terminated or revoked sooner. Performed at [facility], [address] [place], [place] [address]      Scheduled Meds: .  stroke: mapping our early stages of recovery book   Does not apply Once  . aspirin  325 mg Oral Daily  . atorvastatin  40 mg Oral q1800  . clonazePAM  1 mg Oral BID  . feeding supplement (ENSURE ENLIVE)  237 mL Oral TID BM  . feeding supplement (PRO-STAT SUGAR FREE 64)  30 mL Oral BID  . ferrous sulfate  325 mg Oral BID WC  . folic acid  1 mg Oral Daily  . mirtazapine  15 mg Oral QHS  . multivitamin with minerals  1 tablet Oral Daily  . ondansetron  4 mg Oral TID AC  . pantoprazole  40 mg Oral BID AC  . senna-docusate  1 tablet Oral BID   Continuous Infusions: . sodium chloride 50 mL/hr at 09/23/19 1835     LOS: 4 days   Lonia Blood, MD [facility] Office  [phone] Pager - Text Page per Loretha Stapler  If 7PM-7AM, please contact night-coverage per Amion 09/25/2019, 8:33 AM

## 2019-09-25 NOTE — Progress Notes (Signed)
STROKE TEAM PROGRESS NOTE   INTERVAL HISTORY  Husband at bedside.  Patient lying in bed, awake alert, cachectic, still lethargic, no neuro changes.  PT/OT recommend CR.  Patient was encouraged to work with PT and OT.  Vitals:   09/25/19 0032 09/25/19 0400 09/25/19 0743 09/25/19 1145  BP: (!) 150/79 (!) 144/82 (!) 156/85 (!) 142/77  Pulse: 77 80 83 90  Resp: '18 18 20 20  ' Temp: 97.8 F (36.6 C) 97.8 F (36.6 C) 97.8 F (36.6 C) 98.9 F (37.2 C)  TempSrc: Oral Oral Oral   SpO2: 97% 99% 99% 96%    CBC:  Recent Labs  Lab 09/20/19 2013 09/21/19 0821 09/24/19 0326 09/25/19 0416  WBC 10.6*   < > 11.9* 9.6  NEUTROABS 8.2*  --   --   --   HGB 7.1*   < > 7.9* 7.3*  HCT 23.5*   < > 25.1* 23.6*  MCV 92.9   < > 89.3 90.4  PLT 313   < > 218 208   < > = values in this interval not displayed.    Basic Metabolic Panel:  Recent Labs  Lab 09/21/19 0226 09/22/19 0204 09/23/19 0423 09/23/19 0423 09/24/19 0326 09/25/19 0416  NA  --    < > 140   < > 138 140  K  --    < > 4.2   < > 4.1 3.9  CL  --    < > 112*   < > 106 111  CO2  --    < > 21*   < > 24 25  GLUCOSE  --    < > 105*   < > 106* 91  BUN  --    < > 24*   < > 30* 37*  CREATININE  --    < > 1.19*   < > 1.41* 1.33*  CALCIUM  --    < > 8.2*   < > 8.5* 8.3*  MG 1.2*   < > 1.6*  --  1.9  --   PHOS 2.7  --   --   --   --   --    < > = values in this interval not displayed.   Lipid Panel:     Component Value Date/Time   CHOL 59 09/21/2019 0226   TRIG 53 09/21/2019 0226   HDL 20 (L) 09/21/2019 0226   CHOLHDL 3.0 09/21/2019 0226   VLDL 11 09/21/2019 0226   LDLCALC 28 09/21/2019 0226   HgbA1c:  Lab Results  Component Value Date   HGBA1C 5.3 09/21/2019   Urine Drug Screen: No results found for: LABOPIA, COCAINSCRNUR, LABBENZ, AMPHETMU, THCU, LABBARB  Alcohol Level     Component Value Date/Time   ETH <10 04/18/2019 1234    IMAGING past 24 hours  No results found.  PHYSICAL EXAM  Temp:  [97.8 F (36.6 C)-98.9  F (37.2 C)] 98.9 F (37.2 C) (05/23 1145) Pulse Rate:  [75-90] 90 (05/23 1145) Resp:  [18-20] 20 (05/23 1145) BP: (142-156)/(73-85) 142/77 (05/23 1145) SpO2:  [96 %-99 %] 96 % (05/23 1145)  General - Cachectic, well developed, lethargic.  Ophthalmologic - fundi not visualized due to noncooperation.  Cardiovascular - Regular rhythm and rate.  Mental Status -  Level of arousal and orientation to time, place, and person were intact. Language including expression, naming, repetition, comprehension was assessed and found intact. Mild to moderate dysarthria, psychomotor slowing  Cranial Nerves II - XII - II -  Visual field intact OU. III, IV, VI - Extraocular movements intact. V - Facial sensation intact bilaterally. VII - Facial movement intact bilaterally. VIII - Hearing & vestibular intact bilaterally. X - Palate elevates symmetrically. XI - Chin turning & shoulder shrug intact bilaterally. XII - Tongue protrusion intact.  Motor Strength - The patient's strength was normal in RUE and RLE, however, LUE proximal 4/5 but distal with wrist and finger movement 0/5. LLE 4/5 proximal and 2/5 ankle and toe DF/PF.  Bulk was decreased and fasciculations were absent.   Motor Tone - Muscle tone was assessed at the neck and appendages and was normal.  Reflexes - The patient's reflexes were symmetrica 1+l in all extremities and she had no pathological reflexes.  Sensory - Light touch, temperature/pinprick were assessed and were symmetrical except bilateral anterior hand decreased sensation, L>R.    Coordination - The patient had normal movements in the hands with no ataxia or dysmetria, but slow bilaterally.  Tremor was absent.  Gait and Station - deferred.   ASSESSMENT/PLAN Ms. Patricia Stanley is a 74 y.o. female with history of CVA in December 2020 with residual left-sided deficits, HLD, HTN, carotid artery stenosis status post right CEA 04/2019, failure to thrive and anemia, discharged  from [facility] last Friday after admission for persistent diarrhea, worsening weakness, anemia and severe deconditioning, who presents today via EMS with worsening of her left sided weakness.    Stroke:  Multifocal punctate anterior and posterior infarcts, embolic pattern, source unclear. Could be related to reactive hypercoagulable state from infection in the setting of FTT  CT head No acute abnormality. Small focal L cerebellar hypodensity. Small vessel disease. Atrophy. Multiple old B lacunes.   MRI  L CR infarct w/ petechial hemorrhage, R high frontal SO, L cerebellar and left PCA territory punctate infarcts. Small vessel disease. Atrophy. Old lacunes white matter and pons. Multiple scattered microhemorrhages.  CTA head & neck R CEA patent. Mild stenosis proximal L CCA. Moderate L V4 stenosis. Moderate B MCA stenosis.  CT abd/pelvis - no malignancy  2D Echo EF 55-60%. No source of embolus. LA dilated  LE venous doppler no DVT  LDL 28   HgbA1c 5.7  D-dimer 14.59 - likely reactive to infection in the setting of FTT  SCDs for VTE prophylaxis  aspirin 325 mg daily prior to admission, now on aspirin 325 mg daily. No DAPT due to severe anemia needing PRBC  Therapy recommendations:  CIR  Disposition:  pending   Weight loss and FTT  Has lost about 15 to 20 pound for the last 6 months  CTA chest did not show malignancy but enlarged lymph nodes likely reactive.   04/2019 CT abdomen pelvis negative for malignancy  04/2019 CT chest showed diffuse wall thickening of the lower third of the esophagus recommend outpatient EGD.  CT abd/pel - 09/24/2019 - no malignancy  Recent chronic diarrhea, GI initiating Creon, cholestyramine, and Imodium - felt to be too frail for endoscopic evaluation -> now diarrhea improved -> constipation   Anemia  Symptomatic anemia  Likely due to nutritional  Hemoglobin 7.6-7.2-7.1-PRBC-8.8-8.2-8.0->7.9->7.3  Close  monitoring  B12 471  FA 4.1 - on supplement  Homocysteine level pending  Iron panel Fe 21, TIBC 134, ferritin 1206->1056  Occult blood pending  B/l pneumonia  Leukocytosis  WBC 11.7-12.8-10.8-17.0-13.5-12.5->11.9 (TMax Fri 100.2)->9.6 (afebrile)  ESR 29, and CRP 19.8  Ferritin 1206->1056  Autoimmune labs neg except Rheumatoid Factor - 151.9 (H) - likely reactive to  infection  CCP pending, histone Ab pending  Chest CT - bilateral pneumonia, pleural effusion, interstitial pneumonitis. Acute hypoxemic respiratory failure w/ marked progression on CT this admission c/w chronic PNA, concern for chronic aspiration.   Bilateral pleural effusions d/t malnutrition  Bronchiectasis vs atypical PNA  On O2 for SOB episode 5/21  CCM on board  May consider Abx if worsening  Hx stroke/TIA  04/2019 - right corona radiata infarct secondary to small vessel disease in setting of large vessel disease source.  EF 60 to 65%.  LDL 87 and A1c 5.8.  Carotid Doppler right ICA 40 to 59% stenosis.  CTA head and neck right ICA plaque 65 to 70% stenosis.  DAPT x 3 mos then aspirin alone. R ICA stenosis->R CEA 04/2019.  Hypertension  Stable now . Permissive hypertension (OK if <180/105) but gradually normalize in 3-5 days . Long-term BP goal normotensive  Hyperlipidemia  Home meds:  lipitor 80, resumed in hospital  LDL 28, goal < 70  Decrease Lipitor to 40   Continue statin at discharge  Other Stroke Risk Factors  Advanced age  Former Cigarette smoker, quit 24y ago  Migraines  Chronic diastolic CHF  Other Active Problems  FTT  Acute hypoxemic respiratory failure w/ marked progression on CT this admission c/w chronic PNA, concern for chronic aspiration.   Bilateral pleural effusions d/t malnutrition  Major depression  Malnutrition of moderate degree  Generalized body aches  AKI 1.0->1.4->1.19->1.41->1.33  Hospital day # 4  Neurology will sign off. Please call with  questions. Pt will follow up with stroke clinic NP at [facility] on 11/15/19. Thanks for the consult.  Rosalin Hawking, MD PhD Stroke Neurology 09/25/2019 6:11 PM   To contact Stroke Continuity provider, please refer to [email or web address] After hours, contact General Neurology

## 2019-09-26 DIAGNOSIS — R627 Adult failure to thrive: Secondary | ICD-10-CM

## 2019-09-26 LAB — ANCA TITERS
Atypical P-ANCA titer: <1:20 {titer}
C-ANCA: <1:20 {titer}
P-ANCA: 1:80 {titer} — ABNORMAL HIGH

## 2019-09-26 LAB — HOMOCYSTEINE: Homocysteine: 7.3 umol/L (ref 0.0–19.2)

## 2019-09-26 LAB — CYCLIC CITRUL PEPTIDE ANTIBODY, IGG/IGA: CCP Antibodies IgG/IgA: 5 units (ref 0–19)

## 2019-09-26 NOTE — Progress Notes (Signed)
Inpatient Rehabilitation-Admissions Coordinator   Pt's insurance is requesting a peer to peer prior to making a final determination for CIR. Dr. Sharon Seller has agreed to complete peer to peer. Will follow up once there has been a final determination after peer conference.   Cheri Rous, OTR/L  Rehab Admissions Coordinator  [phone] 09/26/2019 3:26 PM

## 2019-09-26 NOTE — Progress Notes (Signed)
Occupational Therapy Treatment Patient Details Name: Patricia Stanley MRN: [id number] DOB: [birth year] Today's Date: 09/26/2019    History of present illness 74 y.o. female with medical history significant of CVA in December, carotid artery stenosis status post right CEA 04/2019,depression, GERD, kidney stones, HTN, anemia, Chronic diarrhea who presented to the ED with left arm numbness and left leg weakness for a few days (worsening of chronic symptoms). Pt also with recent admit for adult failure to thrive. MRI revealed 8 mm acute ischemic left basal ganglia CVA.    OT comments  Pt making steady progress towards OT goals this session. Pt continues to present with decreased LUE strength and FMC, generalized weakness and decreased activity tolerance impacting pts ability to engage in ADLs. Overall, pt requires MOD A +2 for stand pivot transfer with RW. Pt requires assist to manage LUE on RW however noted functional AROM via composite digit flexion/ extension and wrist supination/ pronation. Pt required total A for posterior pericare in standing. Continue to recommend CIR for DC d/t improved level of arousal and progression with therapies. Will continue to follow acutely per POC. Plan to trial platform RW for LUE next session and introduce LUE HEP to increase AROM for ADL participation.    Follow Up Recommendations  CIR;Supervision/Assistance - 24 hour    Equipment Recommendations  None recommended by OT    Recommendations for Other Services      Precautions / Restrictions Precautions Precautions: Fall Precaution Comments: L side weakness, global weakness Required Braces or Orthoses: Other Brace(platform for walker vs eva walker) Restrictions Weight Bearing Restrictions: No Other Position/Activity Restrictions: weak LUE grip       Mobility Bed Mobility Overal bed mobility: Needs Assistance Bed Mobility: Supine to Sit;Sit to Supine Rolling: Mod assist   Supine to sit: Mod assist;+2  for physical assistance;+2 for safety/equipment Sit to supine: Mod assist;+2 for physical assistance;+2 for safety/equipment   General bed mobility comments: MOD A +2 mostly to elevate trunk and scoot hips to EOB  Transfers Overall transfer level: Needs assistance Equipment used: Rolling walker (2 wheeled);2 person hand held assist Transfers: Sit to/from [facility] Sit to Stand: Mod assist;+2 safety/equipment;+2 physical assistance;From elevated surface Stand pivot transfers: Mod assist;From elevated surface;+2 physical assistance       General transfer comment: MOD A for stand pivot transfer from EOB<>BSC with hand held assist. Pt completed second trial with MOD A +2 with RW needing assist manage LUE on RW    Balance Overall balance assessment: Needs assistance Sitting-balance support: Feet supported;Bilateral upper extremity supported Sitting balance-Leahy Scale: Fair Sitting balance - Comments: close min guard for static sitting   Standing balance support: Bilateral upper extremity supported Standing balance-Leahy Scale: Poor Standing balance comment: reliant on external assist                           ADL either performed or assessed with clinical judgement   ADL Overall ADL's : Needs assistance/impaired                         Toilet Transfer: Moderate assistance;+2 for physical assistance;Stand-pivot;BSC;Cueing for sequencing Toilet Transfer Details (indicate cue type and reason): MOD A +1 stand pivot EOB<>BSC Toileting- Clothing Manipulation and Hygiene: Total assistance;Sit to/from stand Toileting - Clothing Manipulation Details (indicate cue type and reason): total A for posterior pericare in standing     Functional mobility during ADLs: Moderate assistance;+2 for physical  assistance;Rolling walker;Cueing for safety;Cueing for sequencing General ADL Comments: pt continues to present with generalized weakness, decreased activity  tolerance and decreased LUE FMC and strength impacting pts ability to engage in ADLs     Vision       Perception     Praxis      Cognition Arousal/Alertness: Lethargic Behavior During Therapy: Flat affect Overall Cognitive Status: Impaired/Different from baseline Area of Impairment: Awareness;Problem solving;Attention;Following commands                   Current Attention Level: Selective;Sustained Memory: Decreased short-term memory Following Commands: Follows one step commands inconsistently;Follows one step commands with increased time   Awareness: Intellectual Problem Solving: Slow processing;Requires verbal cues;Requires tactile cues;Decreased initiation General Comments: pt HOH and requires increased time to process cues; likely secondary to level of arousal, however overall, pt much more alert and attentive during session able to verbalize needs and participate in session        Exercises     Shoulder Instructions       General Comments pt on 2L O2 with sats >90% during session. Pts husband present during session.    Pertinent Vitals/ Pain       Pain Assessment: No/denies pain  Home Living                                          Prior Functioning/Environment              Frequency  Min 2X/week        Progress Toward Goals  OT Goals(current goals can now be found in the care plan section)  Progress towards OT goals: Progressing toward goals  Acute Rehab OT Goals Patient Stated Goal: to get stronger OT Goal Formulation: With patient/family Time For Goal Achievement: 10/06/2019 Potential to Achieve Goals: Good  Plan Discharge plan remains appropriate    Co-evaluation    PT/OT/SLP Co-Evaluation/Treatment: Yes Reason for Co-Treatment: For patient/therapist safety;To address functional/ADL transfers;Necessary to address cognition/behavior during functional activity PT goals addressed during session: Mobility/safety with  mobility;Balance;Proper use of DME;Strengthening/ROM OT goals addressed during session: ADL's and self-care      AM-PAC OT "6 Clicks" Daily Activity     Outcome Measure   Help from another person eating meals?: A Little Help from another person taking care of personal grooming?: A Little Help from another person toileting, which includes using toliet, bedpan, or urinal?: A Lot Help from another person bathing (including washing, rinsing, drying)?: A Lot Help from another person to put on and taking off regular upper body clothing?: A Lot Help from another person to put on and taking off regular lower body clothing?: A Lot 6 Click Score: 14    End of Session Equipment Utilized During Treatment: Gait belt;Rolling walker;Oxygen;Other (comment)(2L)  OT Visit Diagnosis: Hemiplegia and hemiparesis;Muscle weakness (generalized) (M62.81);Other abnormalities of gait and mobility (R26.89) Hemiplegia - Right/Left: Left Hemiplegia - caused by: Cerebral infarction   Activity Tolerance Patient tolerated treatment well   Patient Left in chair;with call bell/phone within reach;with chair alarm set;with family/visitor present   Nurse Communication Mobility status        Time: 1610-9604 OT Time Calculation (min): 28 min  Charges: OT General Charges $OT Visit: 1 Visit OT Treatments $Self Care/Home Management : 8-22 mins  Patricia Amel., Patricia Stanley Acute Rehabilitation Services [phone]    Patricia Stanley  Patricia Stanley 09/26/2019, 3:12 PM

## 2019-09-26 NOTE — Progress Notes (Signed)
NAME:  Patricia Stanley, MRN:  [id number], DOB:  1946-03-13, LOS: 5 ADMISSION DATE:  09/20/2019, CONSULTATION DATE:  09/23/19 REFERRING MD:  Dr. Sharon Seller, CHIEF COMPLAINT:  Abnormal CT chest   Brief History   74 year old female with recent stroke admitted with worsening L sided weakness and found to have new strokes of embolic pattern. Course complicated by hypoxia and CT scan was abnormal with concerns for interstitial disease.   History of present illness   74 year old female with PMH as below, which is significant for admission for stroke in December of 2020. She was found to have carotid artery stenosis and underwent endarterectomy. She was discharged from that admission with some residual left sided deficits. She was then again admitted 5/9 > 5/14 with failure to thrive and chronic diarrhea. She was seen by GI who started Creon, cholestyramine, and imodium. Also had antidepressant changed to stimulate appetite. She again presented 5/18 from home with complaints of worsening left sided weakness. Imaging done in the ED demonstrated multifocal punctate anterior and posterior infarcts. She was admitted for neurology evaluation. Notably she was also newly hypoxic requiring 2L [place] to maintain O2 sats > 85%. CT imaging done demonstrating interstitial process. PCCM consulted.   She describes progressive dyspnea essentially since her stroke in December. Never had any cough or fever. Does not appreciate any trouble swallowing despite notes saying she struggled with breakfast on 5/20. Husband says this is rare. She is a former smoker, quit 20 years ago. She has not worked for 20 years either. She worked at [employer], but did not have any exposure to [employer] process. No dusts or chemicals. Currently she does get dyspneic with talking. Generally comfortable at rest.   Past Medical History   has a past medical history of Closed fracture of coccyx (HCC) (2015), Depression, GERD (gastroesophageal  reflux disease), Headache, History of kidney stones, and Hypertension.   Significant Hospital Events   5/9 > 5/14 admit for FTT.  5/18 admit with new strokes  Consults:  Neurology (stroke) Pulmonary  Procedures:    Significant Diagnostic Tests:  5/18 CT head > Negative for intracranial hemorrhage or territorial infarction. Small focal hypodensity in the left cerebellum, may reflect age indeterminate lacunar infarct. 5/18 MRI brain > 8 mm acute ischemic left basal ganglia infarct. Associated petechial hemorrhage without frank hemorrhagic transformation. Additional small volume subacute ischemic changes involving the high right frontal centrum semi ovale and left cerebellum as above. Multiple scattered chronic micro hemorrhages involving both cerebral and cerebellar hemispheres, most pronounced about the deep gray nuclei. CTA chest 5/19 > Multifocal airspace opacity consistent with pneumonia throughout the lungs bilaterally. Underlying degree of centrilobular emphysema. There is a degree of fibrosis, primarily in the lung bases as well as lower lobe bronchiectatic change.  Micro Data:  COVID negative  Antimicrobials:    Interim history/subjective:  Feels weak today. Breathing comfortably on 2L.   Objective   Blood pressure (!) 165/91, pulse 89, temperature 98.4 F (36.9 C), temperature source Oral, resp. rate 18, SpO2 95 %.        Intake/Output Summary (Last 24 hours) at 09/26/2019 1437 Last data filed at 09/26/2019 0800 Gross per 24 hour  Intake 120 ml  Output 1550 ml  Net -1430 ml   There were no vitals filed for this visit.  Examination: General: Frail elderly female in NAD HENT: [place]/AT, PERRL, no JVD Lungs: Clear bilateral breath sounds. Improved from last evaluation.  Cardiovascular: RRR, no  MRG Abdomen: Soft, non-tender, non-distended Extremities: No acute deformity. Limited mobility on the L.  Neuro: Alert, oriented. Weakness to left upper and lower extremities.    Resolved Hospital Problem list     Assessment & Plan:   CVA: acute multifocal punctate infarcts in the background of R MCA stoke in December of last year. Felt to be embolic in nature.  - Management per primary and Stroke service  Acute hypoxemic respiratory failure: seems to be progressive over the past few months. CT from this admission shows marked change vs progression from previous imaging in December.  There is evidence of bronchiectasis on prior imaging, seems stable.  Chronic aspiration fits the bill, but SLP eval 5/21 was negative and GERD therapy is for the most part maximized. 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 CCP negative, Histone antibody pending.  - Continue supplemental O2. Wean for O2 sats > 90% - Will consult with attending regarding the appropriateness of systemic steroids.  - Histone antibody pending.   Bilateral pleural effusion: small. Likely due to malnourishment. Albumin 1.  - If it turns out she has pneumonia we may need to sample the pleural fluid, otherwise conservative management.   Failure to thrive: appetite seems to be improving. She has been deemed a candidate for CIR.   Best practice:  Diet: Per primary Pain/Anxiety/Delirium protocol (if indicated): NA VAP protocol (if indicated): NA DVT prophylaxis: Per primary GI prophylaxis: Per primary Mobility: PT Code Status: DNR Family Communication: Patient and husband updated bedside Disposition: Med-surg  Labs   CBC: Recent Labs  Lab 09/20/19 2013 09/20/19 2013 09/21/19 0349 09/22/19 0204 09/23/19 0423 09/24/19 0326 09/25/19 0416  WBC 10.6*   < > 17.0* 13.5* 12.5* 11.9* 9.6  NEUTROABS 8.2*  --   --   --   --   --   --   HGB 7.1*   < > 8.8* 8.2* 8.0* 7.9* 7.3*  HCT 23.5*   < > 28.5* 25.8* 25.7* 25.1* 23.6*  MCV 92.9   < > 90.5 88.4 89.5 89.3 90.4  PLT 313   < > 282 274 265 218 208   < > = values in this interval not displayed.     Basic Metabolic Panel: Recent Labs  Lab 09/20/19 2013 09/21/19 0226 09/22/19 0204 09/23/19 0423 09/24/19 0326 09/25/19 0416  NA 141  --  140 140 138 140  K 4.7  --  4.6 4.2 4.1 3.9  CL 113*  --  111 112* 106 111  CO2 22  --  21* 21* 24 25  GLUCOSE 95  --  104* 105* 106* 91  BUN 22  --  21 24* 30* 37*  CREATININE 1.00  --  1.40* 1.19* 1.41* 1.33*  CALCIUM 8.8*  --  7.8* 8.2* 8.5* 8.3*  MG  --  1.2* 1.9 1.6* 1.9  --   PHOS  --  2.7  --   --   --   --    GFR: Estimated Creatinine Clearance: 28.4 mL/min (A) (by C-G formula based on SCr of 1.33 mg/dL (H)). Recent Labs  Lab 09/21/19 0245 09/21/19 0821 09/22/19 0204 09/23/19 0423 09/24/19 0326 09/25/19 0416  PROCALCITON 0.17  --   --   --   --   --   WBC  --    < > 13.5* 12.5* 11.9* 9.6   < > = values in this interval not displayed.    Liver Function  Tests: Recent Labs  Lab 09/21/19 0115 09/22/19 0204 09/23/19 0423 09/24/19 0326  AST 43* 25 26 30   ALT 39 27 22 20   ALKPHOS 94 96 127* 132*  BILITOT 0.9 0.8 0.9 0.4  PROT 5.3* 4.8* 4.6* 4.7*  ALBUMIN 1.6* 1.4* 1.2* 1.3*   No results for input(s): LIPASE, AMYLASE in the last 168 hours. No results for input(s): AMMONIA in the last 168 hours.  ABG No results found for: PHART, PCO2ART, PO2ART, HCO3, TCO2, ACIDBASEDEF, O2SAT   Coagulation Profile: Recent Labs  Lab 09/20/19 2013  INR 1.2    Cardiac Enzymes: Recent Labs  Lab 09/21/19 0226  CKTOTAL 40    HbA1C: Hgb A1c MFr Bld  Date/Time Value Ref Range Status  09/21/2019 02:45 AM 5.3 4.8 - 5.6 % Final    Comment:    (NOTE)         Prediabetes: 5.7 - 6.4         Diabetes: >6.4         Glycemic control for adults with diabetes: <7.0   09/12/2019 03:47 AM 5.7 (H) 4.8 - 5.6 % Final    Comment:    (NOTE) Pre diabetes:          5.7%-6.4% Diabetes:              >6.4% Glycemic control for   <7.0% adults with diabetes     CBG: Recent Labs  Lab 09/20/19 1936 09/22/19 2034  GLUCAP 93 98     Georgann Housekeeper, AGACNP-BC [place]  See Amion for personal pager PCCM on call pager [phone]  09/26/2019 2:37 PM

## 2019-09-26 NOTE — Progress Notes (Signed)
Patricia Stanley  BPZ:[id number] DOB: April 11, 1946 DOA: 09/20/2019 PCP: Marden Noble, MD    Brief Narrative:  74 year old with history of CVA December 2020, carotid artery stenosis status post right CEA December 2020, depression, GERD, kidney stones, HTN, chronic diarrhea, and chronic anemia who presented to the ED with a left arm numbness and left leg weakness for a few days.  This represents a worsening of her chronic symptoms dating back to December 2020.  The patient has recently undergone an evaluation for failure to thrive and was found to have chronic diarrhea with GI initiating Creon, cholestyramine, and Imodium.  She was felt to be too frail for endoscopic evaluation at that time.  Significant Events: 04/2019 hospitalized for R MCA CVA leading to CEA 5/9 > 5/14 admit to [facility] w/ FTT - ongoing diarrhea, weight loss, FTT 5/18 admit via [facility] ED w/ L arm and leg weakness  5/20 venous duplex w/o evidence of DVT 5/20 TTE -EF 55-60% with no WMA -RV moderately enlarged with moderately elevated PA systolic pressure at 45 -LA moderately dilated -RA mildly dilated -moderate pulmonic regurg  Antimicrobials:  None  Subjective: Sitting up in a bedside chair.  In better spirits today.  Has moved her bowels.  Confirms to me that she is highly motivated to participate in rehab and regain her former level of function/enjoyment of life.  Assessment & Plan:  Multifocal punctate anterior and posterior infarcts, embolic pattern MRI noted L CR infarct w/ petechial hemorrhage, R high frontal SO, L cerebellar and left PCA territory punctate infarcts, small vessel disease, atrophy, and old lacunes white matter and pons w/ multiple scattered microhemorrhages - CTa head/neck noted patent R CEA and mild stenosis prox L CCA - LDL 28 - A1c 5.7 - ASA 325mg  only for now (no DAPT due to anemia requiring tx)  Acute hypoxic respiratory failure - bronchiectasis versus atypical pneumonia v/s recurrent aspiration   Patient requiring 2 L nasal cannula to keep sats greater than 85% -lung hyperexpansion and chronic bronchitic changes noted on CXR December 2020 -suggestion of probable chronic interstitial lung disease via CT chest December 2020 - CTa this admit notes what appears to be interstitial pulmonary disease - Pulm has seen and requested SLP eval (which noted no aspiration at time of eval) - to f/u w/ Pulm in outpt setting w/ f/u HRCT chest - ?hydralazine associated lupus - short trial of diuresis has been administered - anticipate need for home O2 at dc   Chronic normocytic anemia  Hemoglobin 7.1 at presentation but was 7.2 Sep 16, 2019 -hemoglobin improved as expected with transfusion of 1 unit PRBC this admission -suspect this is anemia of chronic disease - Albumin is profoundly low as is TIBC -accordingly iron levels are low as well -B12 is acceptable but folate is actually below goal -supplementing with folic acid - will need to monitor hemoglobin on rehab unit  Acute kidney injury Creatinine appears to be stabilizing for now - likely due to [facility] and contrast effect   Unintentional Weight Loss / FTT Has lost ~20# over 6 months -no findings on CT abdomen pelvis to explain this - strongly suspect this is related to hydralazine induced lupus type reaction -monitor - frank discussion had w/ pt and wife that she must get out of bed and break cycle of inactivity - she agrees to CIR   Significantly elevated D-dimer 14.6 at time of presentation - CTa w/o evidence of PE - COVID testing negative - venous duplex bilateral lower  extremities without evidence of DVT  Possible esophageal thickening Noted on CT chest December 2020 with recommendation for outpatient EGD - barium esophagram December 2020 without evidence of esophageal leak or esophageal mass  Slightly dilated pancreatic and common bile ducts Noted per CT chest/abdomen December 2020 with recommendation for outpatient MRCP or ERCP - no similar findings  on CT abdomen this admission  Chronic diarrhea - resolved  Was evaluated by [facility] May 2021 - placed on pancrease 36K U TID, cholestyramine 4g BID, and prn imodium at that time - outpt f/u in 2-3 months was the D/C recommendation - resumed these meds as inpatient but pt now reports ongoing constipation - discontinued these medications - increase bowel regimen  Equivocal UA UA was remarkable for a large number of white blood cells as well as leukocytes but pt w/ no specific UTI sx  Hypomagnesemia Magnesium 1.2 at presentation -corrected to goal  Major depression Per Dr. [place] "was seen by psychiatry on 09/14/2019; Prozac was changed to mirtazapine to assist with appetite stimulation.  Did recommend outpatient provider to consider tapering of clonazepam to decrease symptoms of weakness and fatigue" - weaned klonopin to 0.5mg  5/23 - would plan to wean to off over next 14 days   HTN No change in tx plan today   HLD Continue home medical therapy  Malnutrition of moderate degree   DVT prophylaxis: SCDs Code Status: NO CODE BLUE Family Communication: spoke w/ husband at bedside  Status is: Inpatient  Remains inpatient appropriate because:Unsafe d/c plan   Dispo: The patient is from: Home              Anticipated d/c is to: CIR              Anticipated d/c date is: awaiting bed on CIR               Patient currently is not medically stable to d/c.  Consultants:  Stroke team  Objective: Blood pressure (!) (P) 172/89, pulse (P) 94, temperature (P) 99.4 F (37.4 C), temperature source (P) Oral, resp. rate (P) 20, SpO2 95 %.  Intake/Output Summary (Last 24 hours) at 09/26/2019 0915 Last data filed at 09/26/2019 0445 Gross per 24 hour  Intake --  Output 1550 ml  Net -1550 ml   There were no vitals filed for this visit.  Examination: General: No acute distress -more alert and interactive Lungs: Fine diffuse crackles bilaterally in bases Cardiovascular: Regular rate and  rhythm - no rub  Abdomen: Thin, soft, bowel sounds positive, no rebound - diffusely tender  Extremities: No significant edema B lower extremities  CBC: Recent Labs  Lab 09/20/19 2013 09/21/19 0821 09/23/19 0423 09/24/19 0326 09/25/19 0416  WBC 10.6*   < > 12.5* 11.9* 9.6  NEUTROABS 8.2*  --   --   --   --   HGB 7.1*   < > 8.0* 7.9* 7.3*  HCT 23.5*   < > 25.7* 25.1* 23.6*  MCV 92.9   < > 89.5 89.3 90.4  PLT 313   < > 265 218 208   < > = values in this interval not displayed.   Basic Metabolic Panel: Recent Labs  Lab 09/20/19 2013 09/21/19 0226 09/22/19 0204 09/22/19 0204 09/23/19 0423 09/24/19 0326 09/25/19 0416  NA   < >  --  140   < > 140 138 140  K   < >  --  4.6   < > 4.2 4.1 3.9  CL   < >  --  111   < > 112* 106 111  CO2   < >  --  21*   < > 21* 24 25  GLUCOSE   < >  --  104*   < > 105* 106* 91  BUN   < >  --  21   < > 24* 30* 37*  CREATININE   < >  --  1.40*   < > 1.19* 1.41* 1.33*  CALCIUM   < >  --  7.8*   < > 8.2* 8.5* 8.3*  MG   < > 1.2* 1.9  --  1.6* 1.9  --   PHOS  --  2.7  --   --   --   --   --    < > = values in this interval not displayed.   GFR: Estimated Creatinine Clearance: 28.4 mL/min (A) (by C-G formula based on SCr of 1.33 mg/dL (H)).  Liver Function Tests: Recent Labs  Lab 09/21/19 0115 09/22/19 0204 09/23/19 0423 09/24/19 0326  AST 43* 25 26 30   ALT 39 27 22 20   ALKPHOS 94 96 127* 132*  BILITOT 0.9 0.8 0.9 0.4  PROT 5.3* 4.8* 4.6* 4.7*  ALBUMIN 1.6* 1.4* 1.2* 1.3*    Coagulation Profile: Recent Labs  Lab 09/20/19 2013  INR 1.2    Cardiac Enzymes: Recent Labs  Lab 09/21/19 0226  CKTOTAL 40    HbA1C: Hgb A1c MFr Bld  Date/Time Value Ref Range Status  09/21/2019 02:45 AM 5.3 4.8 - 5.6 % Final    Comment:    (NOTE)         Prediabetes: 5.7 - 6.4         Diabetes: >6.4         Glycemic control for adults with diabetes: <7.0   09/12/2019 03:47 AM 5.7 (H) 4.8 - 5.6 % Final    Comment:    (NOTE) Pre diabetes:           5.7%-6.4% Diabetes:              >6.4% Glycemic control for   <7.0% adults with diabetes     CBG: Recent Labs  Lab 09/20/19 1936 09/22/19 2034  GLUCAP 93 98    Recent Results (from the past 240 hour(s))  SARS Coronavirus 2 by RT PCR (hospital order, performed in [facility] hospital lab) Nasopharyngeal Nasopharyngeal Swab     Status: None   Collection Time: 09/21/19  1:36 AM   Specimen: Nasopharyngeal Swab  Result Value Ref Range Status   SARS Coronavirus 2 NEGATIVE NEGATIVE Final    Comment: (NOTE) SARS-CoV-2 target nucleic acids are NOT DETECTED. The SARS-CoV-2 RNA is generally detectable in upper and lower respiratory specimens during the acute phase of infection. The lowest concentration of SARS-CoV-2 viral copies this assay can detect is 250 copies / mL. A negative result does not preclude SARS-CoV-2 infection and should not be used as the sole basis for treatment or other patient management decisions.  A negative result may occur with improper specimen collection / handling, submission of specimen other than nasopharyngeal swab, presence of viral mutation(s) within the areas targeted by this assay, and inadequate number of viral copies (<250 copies / mL). A negative result must be combined with clinical observations, patient history, and epidemiological information. Fact Sheet for Patients:   [facility] Fact Sheet for Healthcare Providers: 09/23/19 This test is not yet approved or cleared  by the [employer] FDA and has been authorized for  detection and/or diagnosis of SARS-CoV-2 by FDA under an Emergency Use Authorization (EUA).  This EUA will remain in effect (meaning this test can be used) for the duration of the COVID-19 declaration under Section 564(b)(1) of the Act, 21 U.S.C. section 360bbb-3(b)(1), unless the authorization is terminated or revoked sooner. Performed at [facility]  Lab, [address] [place], [place] [address]      Scheduled Meds: . aspirin  325 mg Oral Daily  . atorvastatin  40 mg Oral q1800  . clonazePAM  0.5 mg Oral BID  . feeding supplement (ENSURE ENLIVE)  237 mL Oral TID BM  . feeding supplement (PRO-STAT SUGAR FREE 64)  30 mL Oral BID  . ferrous sulfate  325 mg Oral BID WC  . folic acid  1 mg Oral Daily  . mirtazapine  15 mg Oral QHS  . multivitamin with minerals  1 tablet Oral Daily  . ondansetron  4 mg Oral TID AC  . pantoprazole  40 mg Oral BID AC  . senna-docusate  1 tablet Oral BID      LOS: 5 days   Lonia Blood, MD [facility] Office  [phone] Pager - Text Page per Loretha Stapler  If 7PM-7AM, please contact night-coverage per Amion 09/26/2019, 9:15 AM

## 2019-09-26 NOTE — Progress Notes (Signed)
Physical Therapy Treatment Patient Details Name: Patricia Stanley MRN: [id number] DOB: 04/05/1946 Today's Date: 09/26/2019    History of Present Illness 74 y.o. female with medical history significant of CVA in December, carotid artery stenosis status post right CEA 04/2019,depression, GERD, kidney stones, HTN, anemia, Chronic diarrhea who presented to the ED with left arm numbness and left leg weakness for a few days (worsening of chronic symptoms). Pt also with recent admit for adult failure to thrive. MRI revealed 8 mm acute ischemic left basal ganglia CVA.     PT Comments    Pt was seen for mobility with transfer to [facility], sidestepping minimally to the chair.  After, sidestepped on walker with support for LUE, but will use a platform walker next visit.  Follow up with strengthening ex, with education about sequence of transfers and with safety for all mobility skills.  Continue to recommend CIR as pt is motivated to get stronger and get home.   Follow Up Recommendations  CIR     Equipment Recommendations  None recommended by PT    Recommendations for Other Services Rehab consult     Precautions / Restrictions Precautions Precautions: Fall Precaution Comments: L side weakness, global weakness Required Braces or Orthoses: Other Brace(platform for walker vs eva walker) Restrictions Weight Bearing Restrictions: No Other Position/Activity Restrictions: weak LUE grip    Mobility  Bed Mobility Overal bed mobility: Needs Assistance Bed Mobility: Supine to Sit Rolling: Mod assist   Supine to sit: Mod assist;+2 for physical assistance;+2 for safety/equipment     General bed mobility comments: agreed to sit on side of bed and chair, with cues for postural control both sitting and standing  Transfers Overall transfer level: Needs assistance Equipment used: Rolling walker (2 wheeled);2 person hand held assist Transfers: Sit to/from [facility] Sit to Stand: Mod  assist;+2 safety/equipment;+2 physical assistance;From elevated surface Stand pivot transfers: Mod assist;+2 physical assistance;+2 safety/equipment;From elevated surface          Ambulation/Gait Ambulation/Gait assistance: Mod assist;+2 physical assistance;+2 safety/equipment Gait Distance (Feet): 5 Feet Assistive device: Rolling walker (2 wheeled) Gait Pattern/deviations: Step-to pattern;Decreased stride length;Wide base of support;Trunk flexed;Decreased weight shift to left Gait velocity: Decreased Gait velocity interpretation: <1.8 ft/sec, indicate of risk for recurrent falls General Gait Details: standing and mobility in standing with mod assist and dense cues for controlling buckling on LLE   Stairs             Wheelchair Mobility    Modified Rankin (Stroke Patients Only)       Balance Overall balance assessment: Needs assistance Sitting-balance support: Feet supported;Bilateral upper extremity supported Sitting balance-Leahy Scale: Fair Sitting balance - Comments: 2 person help for control of balance and functional mobility   Standing balance support: Bilateral upper extremity supported;During functional activity Standing balance-Leahy Scale: Poor                              Cognition Arousal/Alertness: Lethargic Behavior During Therapy: Flat affect Overall Cognitive Status: History of cognitive impairments - at baseline Area of Impairment: Awareness;Problem solving                   Current Attention Level: Selective;Sustained Memory: Decreased short-term memory Following Commands: Follows one step commands inconsistently;Follows one step commands with increased time   Awareness: Intellectual;Anticipatory Problem Solving: Slow processing;Requires verbal cues;Requires tactile cues;Decreased initiation General Comments: pt was seen for mobility with dense cues for managmeent  of LUE and use of RUE to support standing      Exercises  General Exercises - Lower Extremity Long Arc Quad: Strengthening;10 reps Heel Slides: Strengthening;10 reps Hip ABduction/ADduction: Strengthening;10 reps    General Comments General comments (skin integrity, edema, etc.): maintaining sats with 2L O2      Pertinent Vitals/Pain Pain Assessment: No/denies pain    Home Living                      Prior Function            PT Goals (current goals can now be found in the care plan section) Acute Rehab PT Goals Patient Stated Goal: to get stronger Progress towards PT goals: Progressing toward goals    Frequency    Min 4X/week      PT Plan Current plan remains appropriate    Co-evaluation PT/OT/SLP Co-Evaluation/Treatment: Yes Reason for Co-Treatment: Complexity of the patient's impairments (multi-system involvement);For patient/therapist safety;To address functional/ADL transfers PT goals addressed during session: Mobility/safety with mobility;Balance;Proper use of DME        AM-PAC PT "6 Clicks" Mobility   Outcome Measure  Help needed turning from your back to your side while in a flat bed without using bedrails?: A Lot Help needed moving from lying on your back to sitting on the side of a flat bed without using bedrails?: A Lot Help needed moving to and from a bed to a chair (including a wheelchair)?: A Lot Help needed standing up from a chair using your arms (e.g., wheelchair or bedside chair)?: A Lot Help needed to walk in hospital room?: A Lot Help needed climbing 3-5 steps with a railing? : Total 6 Click Score: 11    End of Session Equipment Utilized During Treatment: Gait belt;Oxygen Activity Tolerance: Patient tolerated treatment well;Treatment limited secondary to medical complications (Comment) Patient left: in chair;with call bell/phone within reach;with family/visitor present;with chair alarm set Nurse Communication: Mobility status PT Visit Diagnosis: Unsteadiness on feet (R26.81);Muscle  weakness (generalized) (M62.81);Difficulty in walking, not elsewhere classified (R26.2);Other abnormalities of gait and mobility (R26.89);Hemiplegia and hemiparesis;Pain Hemiplegia - Right/Left: Left Hemiplegia - dominant/non-dominant: Non-dominant Hemiplegia - caused by: Cerebral infarction     Time: 9924-2683 PT Time Calculation (min) (ACUTE ONLY): 30 min  Charges:  $Gait Training: 8-22 mins $Therapeutic Activity: 8-22 mins                  Ivar Drape 09/26/2019, 9:01 PM  Samul Dada, PT MS Acute Rehab Dept. Number: [facility] [id number] and [facility] [phone]

## 2019-09-27 ENCOUNTER — Inpatient Hospital Stay (HOSPITAL_COMMUNITY)
Admission: RE | Admit: 2019-09-27 | Discharge: 2019-10-05 | DRG: 057 | Disposition: A | Discharge status: Other Acute Inpatient Hospital | Payer: Medicare HMO | Source: Intra-hospital | Attending: Physical Medicine & Rehabilitation | Admitting: Physical Medicine & Rehabilitation

## 2019-09-27 ENCOUNTER — Inpatient Hospital Stay (HOSPITAL_COMMUNITY): Payer: Medicare HMO

## 2019-09-27 ENCOUNTER — Encounter (HOSPITAL_COMMUNITY): Payer: Self-pay | Admitting: Physical Medicine & Rehabilitation

## 2019-09-27 ENCOUNTER — Other Ambulatory Visit: Payer: Self-pay

## 2019-09-27 DIAGNOSIS — B999 Unspecified infectious disease: Secondary | ICD-10-CM

## 2019-09-27 DIAGNOSIS — R54 Age-related physical debility: Secondary | ICD-10-CM | POA: Diagnosis present

## 2019-09-27 DIAGNOSIS — Z79899 Other long term (current) drug therapy: Secondary | ICD-10-CM

## 2019-09-27 DIAGNOSIS — F329 Major depressive disorder, single episode, unspecified: Secondary | ICD-10-CM | POA: Diagnosis present

## 2019-09-27 DIAGNOSIS — R64 Cachexia: Secondary | ICD-10-CM | POA: Diagnosis present

## 2019-09-27 DIAGNOSIS — K5903 Drug induced constipation: Secondary | ICD-10-CM | POA: Diagnosis not present

## 2019-09-27 DIAGNOSIS — G9349 Other encephalopathy: Secondary | ICD-10-CM | POA: Diagnosis not present

## 2019-09-27 DIAGNOSIS — K219 Gastro-esophageal reflux disease without esophagitis: Secondary | ICD-10-CM | POA: Diagnosis present

## 2019-09-27 DIAGNOSIS — I69354 Hemiplegia and hemiparesis following cerebral infarction affecting left non-dominant side: Secondary | ICD-10-CM | POA: Diagnosis not present

## 2019-09-27 DIAGNOSIS — I6302 Cerebral infarction due to thrombosis of basilar artery: Secondary | ICD-10-CM

## 2019-09-27 DIAGNOSIS — Z20822 Contact with and (suspected) exposure to covid-19: Secondary | ICD-10-CM | POA: Diagnosis not present

## 2019-09-27 DIAGNOSIS — Z9981 Dependence on supplemental oxygen: Secondary | ICD-10-CM | POA: Diagnosis not present

## 2019-09-27 DIAGNOSIS — D72829 Elevated white blood cell count, unspecified: Secondary | ICD-10-CM | POA: Diagnosis not present

## 2019-09-27 DIAGNOSIS — Z90710 Acquired absence of both cervix and uterus: Secondary | ICD-10-CM

## 2019-09-27 DIAGNOSIS — K529 Noninfective gastroenteritis and colitis, unspecified: Secondary | ICD-10-CM | POA: Diagnosis not present

## 2019-09-27 DIAGNOSIS — D638 Anemia in other chronic diseases classified elsewhere: Secondary | ICD-10-CM | POA: Diagnosis not present

## 2019-09-27 DIAGNOSIS — I639 Cerebral infarction, unspecified: Secondary | ICD-10-CM | POA: Diagnosis present

## 2019-09-27 DIAGNOSIS — I63019 Cerebral infarction due to thrombosis of unspecified vertebral artery: Secondary | ICD-10-CM | POA: Diagnosis not present

## 2019-09-27 DIAGNOSIS — R0902 Hypoxemia: Secondary | ICD-10-CM | POA: Diagnosis not present

## 2019-09-27 DIAGNOSIS — I10 Essential (primary) hypertension: Secondary | ICD-10-CM | POA: Diagnosis not present

## 2019-09-27 DIAGNOSIS — B952 Enterococcus as the cause of diseases classified elsewhere: Secondary | ICD-10-CM | POA: Diagnosis present

## 2019-09-27 DIAGNOSIS — N390 Urinary tract infection, site not specified: Secondary | ICD-10-CM | POA: Diagnosis not present

## 2019-09-27 DIAGNOSIS — K5900 Constipation, unspecified: Secondary | ICD-10-CM | POA: Diagnosis not present

## 2019-09-27 DIAGNOSIS — E8809 Other disorders of plasma-protein metabolism, not elsewhere classified: Secondary | ICD-10-CM | POA: Diagnosis not present

## 2019-09-27 DIAGNOSIS — Z8249 Family history of ischemic heart disease and other diseases of the circulatory system: Secondary | ICD-10-CM

## 2019-09-27 DIAGNOSIS — R0989 Other specified symptoms and signs involving the circulatory and respiratory systems: Secondary | ICD-10-CM

## 2019-09-27 DIAGNOSIS — I69322 Dysarthria following cerebral infarction: Secondary | ICD-10-CM | POA: Diagnosis not present

## 2019-09-27 DIAGNOSIS — R778 Other specified abnormalities of plasma proteins: Secondary | ICD-10-CM

## 2019-09-27 DIAGNOSIS — Z681 Body mass index (BMI) 19 or less, adult: Secondary | ICD-10-CM | POA: Diagnosis not present

## 2019-09-27 DIAGNOSIS — N179 Acute kidney failure, unspecified: Secondary | ICD-10-CM

## 2019-09-27 DIAGNOSIS — I6340 Cerebral infarction due to embolism of unspecified cerebral artery: Secondary | ICD-10-CM

## 2019-09-27 DIAGNOSIS — M4802 Spinal stenosis, cervical region: Secondary | ICD-10-CM

## 2019-09-27 DIAGNOSIS — J849 Interstitial pulmonary disease, unspecified: Secondary | ICD-10-CM

## 2019-09-27 DIAGNOSIS — R627 Adult failure to thrive: Secondary | ICD-10-CM | POA: Diagnosis present

## 2019-09-27 DIAGNOSIS — E875 Hyperkalemia: Secondary | ICD-10-CM | POA: Diagnosis present

## 2019-09-27 DIAGNOSIS — I2729 Other secondary pulmonary hypertension: Secondary | ICD-10-CM

## 2019-09-27 DIAGNOSIS — Z87891 Personal history of nicotine dependence: Secondary | ICD-10-CM

## 2019-09-27 DIAGNOSIS — R7401 Elevation of levels of liver transaminase levels: Secondary | ICD-10-CM | POA: Diagnosis present

## 2019-09-27 DIAGNOSIS — E785 Hyperlipidemia, unspecified: Secondary | ICD-10-CM | POA: Diagnosis present

## 2019-09-27 DIAGNOSIS — R7303 Prediabetes: Secondary | ICD-10-CM

## 2019-09-27 DIAGNOSIS — F419 Anxiety disorder, unspecified: Secondary | ICD-10-CM | POA: Diagnosis present

## 2019-09-27 DIAGNOSIS — R0603 Acute respiratory distress: Secondary | ICD-10-CM

## 2019-09-27 DIAGNOSIS — R7989 Other specified abnormal findings of blood chemistry: Secondary | ICD-10-CM

## 2019-09-27 DIAGNOSIS — Z7982 Long term (current) use of aspirin: Secondary | ICD-10-CM

## 2019-09-27 DIAGNOSIS — R509 Fever, unspecified: Secondary | ICD-10-CM | POA: Diagnosis not present

## 2019-09-27 DIAGNOSIS — M5412 Radiculopathy, cervical region: Secondary | ICD-10-CM | POA: Diagnosis present

## 2019-09-27 DIAGNOSIS — Z90721 Acquired absence of ovaries, unilateral: Secondary | ICD-10-CM

## 2019-09-27 DIAGNOSIS — Z888 Allergy status to other drugs, medicaments and biological substances status: Secondary | ICD-10-CM

## 2019-09-27 DIAGNOSIS — R0602 Shortness of breath: Secondary | ICD-10-CM

## 2019-09-27 LAB — COMPREHENSIVE METABOLIC PANEL
ALT: 25 U/L (ref 0–44)
AST: 41 U/L (ref 15–41)
Albumin: 1.5 g/dL — ABNORMAL LOW (ref 3.5–5.0)
Alkaline Phosphatase: 153 U/L — ABNORMAL HIGH (ref 38–126)
Anion gap: 10 (ref 5–15)
BUN: 30 mg/dL — ABNORMAL HIGH (ref 8–23)
CO2: 28 mmol/L (ref 22–32)
Calcium: 8.6 mg/dL — ABNORMAL LOW (ref 8.9–10.3)
Chloride: 104 mmol/L (ref 98–111)
Creatinine, Ser: 1.32 mg/dL — ABNORMAL HIGH (ref 0.44–1.00)
GFR calc Af Amer: 46 mL/min — ABNORMAL LOW (ref >60)
GFR calc non Af Amer: 40 mL/min — ABNORMAL LOW (ref >60)
Glucose, Bld: 112 mg/dL — ABNORMAL HIGH (ref 70–99)
Potassium: 4.3 mmol/L (ref 3.5–5.1)
Sodium: 142 mmol/L (ref 135–145)
Total Bilirubin: 0.9 mg/dL (ref 0.3–1.2)
Total Protein: 5.4 g/dL — ABNORMAL LOW (ref 6.5–8.1)

## 2019-09-27 LAB — CBC
HCT: 24.7 % — ABNORMAL LOW (ref 36.0–46.0)
Hemoglobin: 7.8 g/dL — ABNORMAL LOW (ref 12.0–15.0)
MCH: 28.5 pg (ref 26.0–34.0)
MCHC: 31.6 g/dL (ref 30.0–36.0)
MCV: 90.1 fL (ref 80.0–100.0)
Platelets: 219 10*3/uL (ref 150–400)
RBC: 2.74 MIL/uL — ABNORMAL LOW (ref 3.87–5.11)
RDW: 20.5 % — ABNORMAL HIGH (ref 11.5–15.5)
WBC: 14.3 10*3/uL — ABNORMAL HIGH (ref 4.0–10.5)
nRBC: 0 % (ref 0.0–0.2)

## 2019-09-27 LAB — HISTONE ANTIBODIES, IGG, BLOOD: DNA-Histone: 0.6 Units (ref 0.0–0.9)

## 2019-09-27 LAB — GLUCOSE, CAPILLARY: Glucose-Capillary: 123 mg/dL — ABNORMAL HIGH (ref 70–99)

## 2019-09-27 MED ORDER — POLYETHYLENE GLYCOL 3350 17 G PO PACK
17.0000 g | PACK | Freq: Every day | ORAL | Status: DC
  Administered 2019-09-28 – 2019-10-05 (×8): 17 g via ORAL
  Filled 2019-09-27 (×7): qty 1

## 2019-09-27 MED ORDER — ASPIRIN 325 MG PO TABS
325.0000 mg | ORAL_TABLET | Freq: Every day | ORAL | Status: DC
  Administered 2019-09-28 – 2019-10-05 (×8): 325 mg via ORAL
  Filled 2019-09-27 (×8): qty 1

## 2019-09-27 MED ORDER — ACETAMINOPHEN 325 MG PO TABS
650.0000 mg | ORAL_TABLET | ORAL | Status: DC | PRN
  Administered 2019-09-27 – 2019-10-05 (×11): 650 mg via ORAL
  Filled 2019-09-27 (×13): qty 2

## 2019-09-27 MED ORDER — SORBITOL 70 % SOLN: 30.0000 mL | Freq: Every day | Status: DC | PRN

## 2019-09-27 MED ORDER — FERROUS SULFATE 325 (65 FE) MG PO TABS
325.0000 mg | ORAL_TABLET | Freq: Two times a day (BID) | ORAL | Status: DC
  Administered 2019-09-27 – 2019-10-05 (×17): 325 mg via ORAL
  Filled 2019-09-27 (×17): qty 1

## 2019-09-27 MED ORDER — ENSURE ENLIVE PO LIQD
237.0000 mL | Freq: Three times a day (TID) | ORAL | Status: DC
  Administered 2019-09-27 – 2019-10-05 (×15): 237 mL via ORAL

## 2019-09-27 MED ORDER — TRAMADOL HCL 50 MG PO TABS
50.0000 mg | ORAL_TABLET | Freq: Four times a day (QID) | ORAL | Status: DC | PRN
  Administered 2019-09-27 – 2019-10-05 (×16): 50 mg via ORAL
  Filled 2019-09-27 (×17): qty 1

## 2019-09-27 MED ORDER — CLONAZEPAM 0.5 MG PO TABS
0.5000 mg | ORAL_TABLET | Freq: Two times a day (BID) | ORAL | Status: DC
  Administered 2019-09-27 – 2019-10-04 (×13): 0.5 mg via ORAL
  Filled 2019-09-27 (×15): qty 1

## 2019-09-27 MED ORDER — ADULT MULTIVITAMIN W/MINERALS CH
1.0000 | ORAL_TABLET | Freq: Every day | ORAL | Status: DC
  Administered 2019-09-28 – 2019-10-05 (×8): 1 via ORAL
  Filled 2019-09-27 (×8): qty 1

## 2019-09-27 MED ORDER — SENNOSIDES-DOCUSATE SODIUM 8.6-50 MG PO TABS
1.0000 | ORAL_TABLET | Freq: Two times a day (BID) | ORAL | Status: DC
  Administered 2019-09-27 – 2019-10-05 (×16): 1 via ORAL
  Filled 2019-09-27 (×16): qty 1

## 2019-09-27 MED ORDER — BISACODYL 10 MG RE SUPP: 10.0000 mg | Freq: Every day | RECTAL | Status: DC | PRN

## 2019-09-27 MED ORDER — ONDANSETRON HCL 4 MG PO TABS
4.0000 mg | ORAL_TABLET | Freq: Three times a day (TID) | ORAL | Status: DC
  Administered 2019-09-27 – 2019-10-05 (×25): 4 mg via ORAL
  Filled 2019-09-27 (×25): qty 1

## 2019-09-27 MED ORDER — LACTULOSE 10 GM/15ML PO SOLN
20.0000 g | Freq: Two times a day (BID) | ORAL | Status: DC | PRN
  Administered 2019-10-02: 20 g via ORAL
  Filled 2019-09-27 (×2): qty 30

## 2019-09-27 MED ORDER — PRO-STAT SUGAR FREE PO LIQD
30.0000 mL | Freq: Two times a day (BID) | ORAL | Status: DC
  Administered 2019-09-27: 30 mL via ORAL
  Filled 2019-09-27: qty 30

## 2019-09-27 MED ORDER — BLOOD PRESSURE CONTROL BOOK
Freq: Once | Status: DC
  Filled 2019-09-27: qty 1

## 2019-09-27 MED ORDER — PANTOPRAZOLE SODIUM 40 MG PO TBEC
40.0000 mg | DELAYED_RELEASE_TABLET | Freq: Two times a day (BID) | ORAL | Status: DC
  Administered 2019-09-27 – 2019-10-05 (×17): 40 mg via ORAL
  Filled 2019-09-27 (×17): qty 1

## 2019-09-27 MED ORDER — ATORVASTATIN CALCIUM 40 MG PO TABS
40.0000 mg | ORAL_TABLET | Freq: Every day | ORAL | Status: DC
  Administered 2019-09-27 – 2019-10-05 (×9): 40 mg via ORAL
  Filled 2019-09-27 (×9): qty 1

## 2019-09-27 MED ORDER — FOLIC ACID 1 MG PO TABS
1.0000 mg | ORAL_TABLET | Freq: Every day | ORAL | Status: DC
  Administered 2019-09-28 – 2019-10-05 (×8): 1 mg via ORAL
  Filled 2019-09-27 (×8): qty 1

## 2019-09-27 MED ORDER — MIRTAZAPINE 15 MG PO TABS
15.0000 mg | ORAL_TABLET | Freq: Every day | ORAL | Status: DC
  Administered 2019-09-27 – 2019-10-04 (×8): 15 mg via ORAL
  Filled 2019-09-27 (×8): qty 1

## 2019-09-27 NOTE — Discharge Summary (Signed)
DISCHARGE SUMMARY  Patricia Stanley  MR#: [id number]  DOB:[birth year]  Date of Admission: 09/20/2019 Date of Discharge: 09/27/2019  Attending Physician:Iviana Blasingame Silvestre Gunner, MD  Patient's VQM:GQQPY, Molly Maduro, MD  Consults: Stroke Team Pulmonary   Disposition: D/C to CIR   Follow-up Appts: Follow-up Information    Parrett, Virgel Bouquet, NP Follow up on 10/13/2019.   Specialty: Pulmonary Disease Why: 3 PM Contact information: [address] [place] [address] [phone]        Ihor Austin, NP. Go on 11/15/2019.   Specialty: Neurology Contact information: [address] [place] [address] [phone]           Tests Needing Follow-up: -Attempt to wean to room air and assess saturations with exertion to determine if home oxygen will be required at time of discharge from CIR -Pulmonary to follow-up on histone antibody and myositis panel and follow-up -Intermittent monitoring of hemoglobin is indicated -Check folic acid level in 6-8 weeks -Continue slow weaning of Klonopin with goal to discontinue it  Discharge Diagnoses: Multifocalpunctateanterior and posteriorinfarcts, embolic pattern Acute hypoxic respiratory failure - bronchiectasis versus atypical pneumonia v/s recurrent aspiration v/s lupus related lung disease  Small bilateral pleural effusions Pulmonary hypertension Chronic normocytic anemia  Acute kidney injury Hydaralazine induced lupus  Unintentional Weight Loss  Possible esophageal thickening Slightly dilated pancreatic and common bile ducts Chronic diarrhea - resolved  Hypomagnesemia Major depression HTN HLD Malnutrition of moderate degree  Initial presentation: 74 year old with history of CVA December 2020, carotid artery stenosis status post right CEA December 2020, depression, GERD, kidney stones, HTN, chronic diarrhea, and chronic anemia who presented to the ED with a left arm numbness and left leg weakness for a few days.   This represents a worsening of her chronic symptoms dating back to December 2020.  The patient has recently undergone an evaluation for failure to thrive and was found to have chronic diarrhea with GI initiating Creon, cholestyramine, and Imodium.  She was felt to be too frail for endoscopic evaluation at that time.  Significant Events: 04/2019 hospitalized for R MCA CVA leading to CEA 5/9 > 5/14 admit to [facility] w/ FTT - ongoing diarrhea, weight loss, FTT 5/18 admit via [facility] ED w/ L arm and leg weakness  5/20 venous duplex w/o evidence of DVT 5/20 TTE -EF 55-60% with no WMA -RV moderately enlarged with moderately elevated PA systolic pressure at 45 -LA moderately dilated -RA mildly dilated -moderate pulmonic regurg 5/25 d/c to [facility]   Hospital Course:  Multifocalpunctateanterior and posteriorinfarcts, embolic pattern MRI noted LCRinfarct w/ petechial hemorrhage,RhighfrontalSO,L cerebellarand left PCA territory punctateinfarcts, small vessel disease, atrophy, and old lacunes white matter and pons w/ multiple scattered microhemorrhages - CTa head/neck noted patent R CEA and mild stenosis prox L CCA - LDL 28 - A1c 5.7 - ASA 325mg  only for now (no DAPT due to anemia requiring tx) -work-up directed by Stroke Service -felt to likely be related to hypercoagulable state from pulmonary infection/inflammation  Acute hypoxic respiratory failure - bronchiectasis versus atypical pneumonia v/s recurrent aspiration  Patient required 2 L nasal cannula to keep sats greater than 85% throughout most of her hospitalization - lung hyperexpansion and chronic bronchitic changes noted on CXR December 2020 - suggestion of probable chronic interstitial lung disease via CT chest December 2020 - CTa this admit notes what appears to be interstitial pulmonary disease and significant progression/marked changes since prior imaging in December 2020 - Pulm has seen and requested SLP eval (which noted no  aspiration at time  of eval) - to f/u w/ Pulm in outpt setting w/ f/u HRCT chest - ?hydralazine associated lupus induced lung injury- short trial of diuresis has been administered -histone antibody pending at time of discharge -myositis panel pending at time of discharge  Small bilateral pleural effusions Felt to be a consequence of malnutrition with severely depressed albumin at approximately 1.0 -intermittent diuretic as needed  Chronic normocytic anemia  Hemoglobin 7.1 at presentation but was 7.2 Sep 16, 2019 -hemoglobin improved as expected with transfusion of 1 unit PRBC this admission -suspect this is anemia of chronic disease - Albumin is profoundly low as is TIBC -accordingly iron levels are low as well -B12 is acceptable but folate is actually below goal -supplementing with folic acid - will need to monitor hemoglobin on rehab unit  Acute kidney injury Felt to be due to a combination of DH and IV contrast, as well as poor nutritional intake/dehydration -creatinine stabilizing at approximately 1.3 during this admission which represents possible CKD stage III -recheck creatinine in follow-up as it is quite possible her renal function will continue to improve  Unintentional Weight Loss / Hydralazine induced lupus Has lost ~20# over 6 months -no findings on CT abdomen pelvis to explain this - strongly suspect this is related to hydralazine induced lupus type reaction -monitor - frank discussion had w/ pt and wife that she must get out of bed and break cycle of inactivity - she agrees to CIR -on detailed history the patient's husband reports that the patient had actually begun to improve significantly from her initial stroke but then developed severe symptoms of joint aches, fatigue, malar facial rash, and diffuse achy pain coincident with the new initiation of hydralazine therapy -it is felt that this patient is likely suffering with severe failure to thrive related to hydralazine induced lupus -this may in fact be  responsible for her pulmonary disease as well, or the pulmonary disease could be a separate issue that is also playing a role -she should never be given hydralazine again and it will be listed as an allergy to assure this -aggressive rehab is a must in order to return her to her prior baseline  Significantly elevated D-dimer 14.6 at time of presentation - CTa w/o evidence of PE - COVID testing negative - venous duplex bilateral lower extremities without evidence of DVT -likely a consequence of her ill-defined inflammatory pulmonary disease /hydralazine induced lupus  Possible esophageal thickening Noted on CT chest December 2020 with recommendation for outpatient EGD - barium esophagram December 2020 without evidence of esophageal leak or esophageal mass  Slightly dilated pancreatic and common bile ducts Noted per CT chest/abdomen December 2020 with recommendation for outpatient MRCP or ERCP - no similar findings on CT abdomen this admission  Chronic diarrhea - resolved  Was evaluated by [facility] May 2021 - placed on pancrease 36K U TID, cholestyramine 4g BID, and prn imodium at that time - outpt f/u in 2-3 months was the D/C recommendation - resumed these meds as inpatient but this contributed to ongoing constipation - discontinued these medications - increased bowel regimen  Equivocal UA UA was remarkable for a large number of white blood cells as well as leukocytes but pt w/ no specific UTI sx  Hypomagnesemia Magnesium 1.2 at presentation -corrected to goal  Major depression Per Dr. [place] "was seen by psychiatry on 09/14/2019;Prozac was changed to mirtazapine to assist with appetite stimulation. Did recommend outpatient provider to consider tapering of clonazepam to decrease  symptoms of weakness and fatigue" - weaned klonopin to 0.5mg  5/23 - would plan to wean to off over next 14 days   HTN Further titration of blood pressure medications will likely be  indicated  HLD Continue home medical therapy  Malnutrition of moderate degree  Allergies  Allergen Reactions  . Hydralazine Hcl     Lupus type reaction - DO NOT USE   . Antihistamines, Chlorpheniramine-Type Other (See Comments)    Makes her heart race  . Paroxetine Other (See Comments)    Shakes-tremors    Current Facility-Administered Medications:  .  acetaminophen (TYLENOL) tablet 650 mg, 650 mg, Oral, Q4H PRN, 650 mg at 09/21/19 0131 **OR** [DISCONTINUED] acetaminophen (TYLENOL) 160 MG/5ML solution 650 mg, 650 mg, Per Tube, Q4H PRN **OR** [DISCONTINUED] acetaminophen (TYLENOL) suppository 650 mg, 650 mg, Rectal, Q4H PRN, Doutova, Anastassia, MD .  aspirin tablet 325 mg, 325 mg, Oral, Daily, Doutova, Anastassia, MD, 325 mg at 09/27/19 0955 .  atorvastatin (LIPITOR) tablet 40 mg, 40 mg, Oral, q1800, Marvel PlanXu, Jindong, MD, 40 mg at 09/26/19 1848 .  bisacodyl (DULCOLAX) suppository 10 mg, 10 mg, Rectal, Daily PRN, Lonia BloodMcClung, Jilliam Bellmore T, MD .  clonazePAM Scarlette [place](KLONOPIN) tablet 0.5 mg, 0.5 mg, Oral, BID, Lonia BloodMcClung, Sameena Artus T, MD, 0.5 mg at 09/27/19 1001 .  feeding supplement (ENSURE ENLIVE) (ENSURE ENLIVE) liquid 237 mL, 237 mL, Oral, TID BM, Doutova, Anastassia, MD, 237 mL at 09/26/19 2000 .  feeding supplement (PRO-STAT SUGAR FREE 64) liquid 30 mL, 30 mL, Oral, BID, Doutova, Anastassia, MD, 30 mL at 09/27/19 1000 .  ferrous sulfate tablet 325 mg, 325 mg, Oral, BID WC, Doutova, Anastassia, MD, 325 mg at 09/27/19 0958 .  folic acid (FOLVITE) tablet 1 mg, 1 mg, Oral, Daily, Lonia BloodMcClung, Lazette Estala T, MD, 1 mg at 09/27/19 0958 .  HYDROcodone-acetaminophen (NORCO/VICODIN) 5-325 MG per tablet 1 tablet, 1 tablet, Oral, Q6H PRN, Lonia BloodMcClung, Cordell Coke T, MD, 1 tablet at 09/26/19 2209 .  lactulose (CHRONULAC) 10 GM/15ML solution 20 g, 20 g, Oral, BID PRN, Lonia BloodMcClung, Canary Fister T, MD, 20 g at 09/27/19 0955 .  mirtazapine (REMERON) tablet 15 mg, 15 mg, Oral, QHS, Doutova, Anastassia, MD, 15 mg at 09/26/19 2115 .  multivitamin  with minerals tablet 1 tablet, 1 tablet, Oral, Daily, Doutova, Anastassia, MD, 1 tablet at 09/27/19 1000 .  ondansetron (ZOFRAN) tablet 4 mg, 4 mg, Oral, TID AC, Lonia BloodMcClung, Maxine Huynh T, MD, 4 mg at 09/27/19 0955 .  pantoprazole (PROTONIX) EC tablet 40 mg, 40 mg, Oral, BID AC, Doutova, Anastassia, MD, 40 mg at 09/27/19 0958 .  senna-docusate (Senokot-S) tablet 1 tablet, 1 tablet, Oral, BID, Lonia BloodMcClung, Sunnie Odden T, MD, 1 tablet at 09/27/19 1001 .  traMADol (ULTRAM) tablet 50 mg, 50 mg, Oral, Q6H PRN, Doutova, Anastassia, MD, 50 mg at 09/27/19 1001   Day of Discharge BP (!) 153/79   Pulse 95   Temp 98.4 F (36.9 C) (Oral)   Resp 18   SpO2 96%   Physical Exam: General: No acute respiratory distress Lungs: fine crackles B bases - no wheezing  Cardiovascular: Regular rate and rhythm without murmur  Abdomen: Nontender, nondistended, soft, bowel sounds positive, no rebound Extremities: No significant cyanosis, clubbing, or edema bilateral lower extremities  Basic Metabolic Panel: Recent Labs  Lab 09/20/19 2013 09/21/19 0226 09/22/19 0204 09/23/19 0423 09/24/19 0326 09/25/19 0416 09/27/19 0547  NA   < >  --  140 140 138 140 142  K   < >  --  4.6 4.2 4.1 3.9 4.3  CL   < >  --  111 112* 106 111 104  CO2   < >  --  21* 21* 24 25 28   GLUCOSE   < >  --  104* 105* 106* 91 112*  BUN   < >  --  21 24* 30* 37* 30*  CREATININE   < >  --  1.40* 1.19* 1.41* 1.33* 1.32*  CALCIUM   < >  --  7.8* 8.2* 8.5* 8.3* 8.6*  MG  --  1.2* 1.9 1.6* 1.9  --   --   PHOS  --  2.7  --   --   --   --   --    < > = values in this interval not displayed.    Liver Function Tests: Recent Labs  Lab 09/21/19 0115 09/22/19 0204 09/23/19 0423 09/24/19 0326 09/27/19 0547  AST 43* 25 26 30  41  ALT 39 27 22 20 25   ALKPHOS 94 96 127* 132* 153*  BILITOT 0.9 0.8 0.9 0.4 0.9  PROT 5.3* 4.8* 4.6* 4.7* 5.4*  ALBUMIN 1.6* 1.4* 1.2* 1.3* 1.5*   Coags: Recent Labs  Lab 09/20/19 2013  INR 1.2   CBC: Recent Labs  Lab  09/20/19 2013 09/21/19 0821 09/22/19 0204 09/23/19 0423 09/24/19 0326 09/25/19 0416 09/27/19 0547  WBC 10.6*   < > 13.5* 12.5* 11.9* 9.6 14.3*  NEUTROABS 8.2*  --   --   --   --   --   --   HGB 7.1*   < > 8.2* 8.0* 7.9* 7.3* 7.8*  HCT 23.5*   < > 25.8* 25.7* 25.1* 23.6* 24.7*  MCV 92.9   < > 88.4 89.5 89.3 90.4 90.1  PLT 313   < > 274 265 218 208 219   < > = values in this interval not displayed.    Cardiac Enzymes: Recent Labs  Lab 09/21/19 0226  CKTOTAL 40    CBG: Recent Labs  Lab 09/20/19 1936 09/22/19 2034  GLUCAP 93 98    Recent Results (from the past 240 hour(s))  SARS Coronavirus 2 by RT PCR (hospital order, performed in [facility] hospital lab) Nasopharyngeal Nasopharyngeal Swab     Status: None   Collection Time: 09/21/19  1:36 AM   Specimen: Nasopharyngeal Swab  Result Value Ref Range Status   SARS Coronavirus 2 NEGATIVE NEGATIVE Final    Comment: (NOTE) SARS-CoV-2 target nucleic acids are NOT DETECTED. The SARS-CoV-2 RNA is generally detectable in upper and lower respiratory specimens during the acute phase of infection. The lowest concentration of SARS-CoV-2 viral copies this assay can detect is 250 copies / mL. A negative result does not preclude SARS-CoV-2 infection and should not be used as the sole basis for treatment or other patient management decisions.  A negative result may occur with improper specimen collection / handling, submission of specimen other than nasopharyngeal swab, presence of viral mutation(s) within the areas targeted by this assay, and inadequate number of viral copies (<250 copies / mL). A negative result must be combined with clinical observations, patient history, and epidemiological information. Fact Sheet for Patients:   2035 Fact Sheet for Healthcare Providers: [facility] This test is not yet approved or cleared  by the 09/23/19 FDA and has been  authorized for detection and/or diagnosis of SARS-CoV-2 by FDA under an Emergency Use Authorization (EUA).  This EUA will remain in effect (meaning this test can be used) for the duration of the COVID-19 declaration under  Section 564(b)(1) of the Act, 21 U.S.C. section 360bbb-3(b)(1), unless the authorization is terminated or revoked sooner. Performed at [facility], [address] [place], [place] [address]      Time spent in discharge (includes decision making & examination of pt): 35 minutes  09/27/2019, 9:21 AM   Lonia Blood, MD [facility] Office  [phone]

## 2019-09-27 NOTE — Progress Notes (Signed)
Horton Chin, MD  Physician  Physical Medicine and Rehabilitation  Consult Note      Signed  Date of Service:  09/23/2019  4:53 AM      Related encounter: ED to Hosp-Admission (Current) from 09/20/2019 in [place] [facility]      Signed      Expand AllCollapse All            Physical Medicine and Rehabilitation Consult Reason for Consult: Left side weakness and numbness Referring Physician: [facility]     HPI: Patricia Stanley is a 74 y.o. 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  Per chart review patient lives with spouse.  1 level home 6 steps to entry.  Ambulates with a rolling walker but has needed some additional support over the past month due to failure to thrive.  Presented 09/20/2019 with increased left arm weakness numbness as well as left leg weakness.  Cranial CT scan negative for intracranial hemorrhage or territorial infarct.  Small focal hypodensity in the left cerebellum reflecting possible indeterminate lacunar infarction.  Patient did not receive TPA.  MRI showed an 8 mm acute ischemic left basal ganglia infarction.  Associated petechial hemorrhage without frank hemorrhagic transformation.  Multiple scattered chronic microhemorrhages involving both cerebral and cerebellar hemispheres most pronounced about the deep gray nuclei.  MRI cervical spine degenerative disc osteophyte C 4-5 with resultant moderate spinal stenosis.  CT angio of the chest showed no demonstration of pulmonary emboli.  CT angiogram head and neck right carotid enterectomy widely patent.  No significant stenosis in the left carotid bifurcation.  Negative for intracranial large vessel occlusion.  Echocardiogram with ejection fraction of 60% no wall motion abnormalities.  Noted admission chemistries hemoglobin 7.1 as well is  noted hemoglobin 7.25/14 WBC 10.6, chloride 113, BUN 22, urinalysis negative nitrite, troponin 33, SARS coronavirus negative, sedimentation rate 29.  Venous Doppler studies negative for DVT.  Currently maintained on aspirin 3 and 25 mg daily for CVA prophylaxis with work-up ongoing.  Therapy evaluations completed with recommendations of physical medicine rehab consult.   Review of Systems  Constitutional: Positive for malaise/fatigue. Negative for chills and fever.  HENT: Negative for hearing loss.   Eyes: Negative for blurred vision and double vision.  Respiratory: Negative for cough and shortness of breath.   Cardiovascular: Positive for leg swelling. Negative for chest pain and palpitations.  Gastrointestinal: Negative for nausea.       GERD, chronic diarrhea  Genitourinary: Negative for dysuria, flank pain and hematuria.  Musculoskeletal: Positive for joint pain and myalgias.  Skin: Negative for rash.  Neurological: Positive for dizziness and headaches.  All other systems reviewed and are negative.       Past Medical History:  Diagnosis Date  . Closed fracture of coccyx (HCC) 2015    AFTER FALL  . Depression    . GERD (gastroesophageal reflux disease)    . Headache      MIGRAINES OCCASIONAL  . History of kidney stones      HAS NOW  . Hypertension           Past Surgical History:  Procedure Laterality Date  . ABDOMINAL HYSTERECTOMY        1 OVARY LEFT  . APPENDECTOMY   AGE 36    DONE WITH OVARY REMOVAL  . COLONOSCOPY WITH PROPOFOL N/A 12/03/2015    Procedure: COLONOSCOPY  WITH PROPOFOL;  Surgeon: Charolett Bumpers, MD;  Location: [facility];  Service: Endoscopy;  Laterality: N/A;  . ENDARTERECTOMY Right 04/21/2019    Procedure: ENDARTERECTOMY CAROTID;  Surgeon: Cephus Shelling, MD;  Location: [facility] OR;  Service: Vascular;  Laterality: Right;  . ESOPHAGOGASTRODUODENOSCOPY (EGD) WITH PROPOFOL N/A 12/03/2015    Procedure: ESOPHAGOGASTRODUODENOSCOPY (EGD) WITH PROPOFOL;   Surgeon: Charolett Bumpers, MD;  Location: [facility];  Service: Endoscopy;  Laterality: N/A;  . TONSILLECTOMY   AGE 34    AND ADENOIDS REMOVED         Family History  Problem Relation Age of Onset  . Hypertension Mother    . Hyperlipidemia Mother    . Hypertension Father    . Hyperlipidemia Father      Social History:  reports that she quit smoking about 24 years ago. She has never used smokeless tobacco. She reports that she does not drink alcohol or use drugs. Allergies:  Allergies  Allergen Reactions  . Antihistamines, Chlorpheniramine-Type Other (See Comments)      Makes her heart race  . Paroxetine Other (See Comments)      Shakes-tremors          Medications Prior to Admission  Medication Sig Dispense Refill  . aspirin 325 MG tablet Take 1 tablet (325 mg total) by mouth daily. 30 tablet 0  . atenolol (TENORMIN) 50 MG tablet Take 1 tablet (50 mg total) by mouth every evening. 30 tablet 0  . atorvastatin (LIPITOR) 80 MG tablet Take 1 tablet (80 mg total) by mouth daily at 6 PM. 30 tablet 0  . cholestyramine (QUESTRAN) 4 g packet Take 1 packet (4 g total) by mouth 2 (two) times daily. 180 packet 0  . clonazePAM (KLONOPIN) 1 MG tablet 1 tab every a.m. and 2 tabs nightly (Patient taking differently: Take 1 mg by mouth in the morning and at bedtime. ) 60 tablet 0  . ferrous sulfate 325 (65 FE) MG tablet Take 1 tablet (325 mg total) by mouth 2 (two) times daily with a meal. 180 tablet 0  . lipase/protease/amylase (CREON) 36000 UNITS CPEP capsule Take 1 capsule (36,000 Units total) by mouth 3 (three) times daily with meals. 270 capsule 0  . lisinopril (ZESTRIL) 10 MG tablet Take 1 tablet (10 mg total) by mouth daily. 30 tablet 0  . loperamide (IMODIUM) 2 MG capsule Take 1 capsule (2 mg total) by mouth as needed for diarrhea or loose stools. 30 capsule 0  . mirtazapine (REMERON) 15 MG tablet Take 1 tablet (15 mg total) by mouth at bedtime. 90 tablet 0  . ondansetron (ZOFRAN) 4 MG  tablet Take 1 tablet (4 mg total) by mouth 3 (three) times daily before meals. 90 tablet 2  . pantoprazole (PROTONIX) 40 MG tablet Take 1 tablet (40 mg total) by mouth 2 (two) times daily before a meal. 60 tablet 0      Home: Home Living Family/patient expects to be discharged to:: Private residence Living Arrangements: Spouse/significant other, Children Available Help at Discharge: Family, Available 24 hours/day Type of Home: House Home Access: Stairs to enter [employer] of Steps: 6 Entrance Stairs-Rails: Left, Right Home Layout: One level Bathroom Shower/Tub: Health visitor: Standard Home Equipment: Environmental consultant - 2 wheels, Bedside commode, Shower seat, Grab bars - tub/shower, Hand held shower head  Functional History: Prior Function Level of Independence: Needs assistance Gait / Transfers Assistance Needed: ambulates with RW and has needed additional support last month ADL's / Homemaking  Assistance Needed: since her CVA spouse has been providing some assist for ADL Functional Status:  Mobility: Bed Mobility Overal bed mobility: Needs Assistance Bed Mobility: Supine to Sit Rolling: Mod assist Supine to sit: Max assist Sit to supine: Max assist, +2 for physical assistance General bed mobility comments: patient found to be wet in bed, required max assist to get from supine to sitting at edge of bed. unable to maintain sitting balance while changing gown. Posterior leaning with assist needed to attain full upright position. Transfers Overall transfer level: Needs assistance Equipment used: Rolling walker (2 wheeled) Transfers: Sit to/from Stand Sit to Stand: Mod assist, +2 physical assistance Stand pivot transfers: Mod assist, +2 physical assistance General transfer comment: patient unable to maintain standing balance while being cleaned. Leaning to right and forward. Requiring cues and assistance to stand upright and to pivot to recliner. Unable to keep  right UE on walker. Ambulation/Gait Ambulation/Gait assistance: Mod assist, +2 physical assistance Gait Distance (Feet): 4 Feet Assistive device: Rolling walker (2 wheeled) Gait Pattern/deviations: Step-to pattern, Decreased step length - right, Decreased step length - left, Trunk flexed, Staggering right, Narrow base of support General Gait Details: Patient has hyperextension on left, reports B feet numb. Difficulty maintaining standing balance, requires multimodal cues and physical assist for standing balance. Gait velocity: Decreased   ADL: ADL Overall ADL's : Needs assistance/impaired Eating/Feeding: Set up, Sitting Grooming: Minimal assistance, Sitting Upper Body Bathing: Moderate assistance, Sitting Lower Body Bathing: Maximal assistance, +2 for physical assistance, Sitting/lateral leans, Sit to/from stand Upper Body Dressing : Moderate assistance, Sitting Lower Body Dressing: Maximal assistance, +2 for physical assistance, Sitting/lateral leans, Sit to/from stand Toilet Transfer: Moderate assistance, +2 for physical assistance, Stand-pivot Toilet Transfer Details (indicate cue type and reason): simulated via transfer to/from EOB Toileting- Clothing Manipulation and Hygiene: Maximal assistance, +2 for physical assistance, Sitting/lateral lean, Sit to/from stand Functional mobility during ADLs: Moderate assistance, +2 for physical assistance(HHA) General ADL Comments: pt with notable L side weakness, generalized deconditioning and impaired cognition    Cognition: Cognition Overall Cognitive Status: No family/caregiver present to determine baseline cognitive functioning Orientation Level: Oriented X4 Cognition Arousal/Alertness: Awake/alert, Lethargic Behavior During Therapy: Flat affect Overall Cognitive Status: No family/caregiver present to determine baseline cognitive functioning Area of Impairment: Attention, Memory, Following commands, Awareness, Problem solving Current  Attention Level: Sustained Memory: Decreased short-term memory Following Commands: Follows one step commands with increased time, Follows one step commands inconsistently Awareness: Emergent Problem Solving: Slow processing, Requires verbal cues, Decreased initiation, Requires tactile cues General Comments: pt very lethargic initially but once sitting up more awake/alert. pt slow to respond to questions/commands requiring increased time and cues to do so. Pt also HOH so often requiring repetition    Blood pressure (!) 155/78, pulse 76, temperature 98.1 F (36.7 C), temperature source Oral, resp. rate 18, SpO2 97 %. General: No apparent distress. Cachectic HEENT: Head is normocephalic, atraumatic, PERRLA, EOMI, sclera anicteric, oral mucosa pink and moist, dentition intact, ext ear canals clear,  Neck: Supple without JVD or lymphadenopathy Heart: Reg rate and rhythm. No murmurs rubs or gallops Chest: CTA bilaterally without wheezes, rales, or rhonchi; no distress Abdomen: Soft, non-tender, non-distended, bowel sounds positive. Extremities: No clubbing, cyanosis, or edema. Pulses are 2+ Skin: Clean and intact without signs of breakdown Neuro/MSK: AOx2 (states name is Patricia Stanley). Dysarthria.  RUE/RLE 4+/5 strength LUE: 4-/5 proximally, 0/5 in WE and hand grip. LLE: 4-/5 proximally and 2/5 DF and PF Decreased sensation over left hand.  Psych:  Pt's affect is appropriate. Pt is cooperative   Lab Results Last 24 Hours       Results for orders placed or performed during the hospital encounter of 09/20/19 (from the past 24 hour(s))  Vitamin B12     Status: None    Collection Time: 09/22/19  2:04 AM  Result Value Ref Range    Vitamin B-12 471 180 - 914 pg/mL  Folate     Status: Abnormal    Collection Time: 09/22/19  2:04 AM  Result Value Ref Range    Folate 4.1 (L) >5.9 ng/mL  Iron and TIBC     Status: Abnormal    Collection Time: 09/22/19  2:04 AM  Result Value Ref Range    Iron 21 (L) 28 -  170 ug/dL    TIBC 098 (L) 119 - 147 ug/dL    Saturation Ratios 16 10.4 - 31.8 %    UIBC 113 ug/dL  Ferritin     Status: Abnormal    Collection Time: 09/22/19  2:04 AM  Result Value Ref Range    Ferritin 1,056 (H) 11 - 307 ng/mL  Reticulocytes     Status: Abnormal    Collection Time: 09/22/19  2:04 AM  Result Value Ref Range    Retic Ct Pct 4.2 (H) 0.4 - 3.1 %    RBC. 2.96 (L) 3.87 - 5.11 MIL/uL    Retic Count, Absolute 124.3 19.0 - 186.0 K/uL    Immature Retic Fract 46.2 (H) 2.3 - 15.9 %  Comprehensive metabolic panel     Status: Abnormal    Collection Time: 09/22/19  2:04 AM  Result Value Ref Range    Sodium 140 135 - 145 mmol/L    Potassium 4.6 3.5 - 5.1 mmol/L    Chloride 111 98 - 111 mmol/L    CO2 21 (L) 22 - 32 mmol/L    Glucose, Bld 104 (H) 70 - 99 mg/dL    BUN 21 8 - 23 mg/dL    Creatinine, Ser 8.29 (H) 0.44 - 1.00 mg/dL    Calcium 7.8 (L) 8.9 - 10.3 mg/dL    Total Protein 4.8 (L) 6.5 - 8.1 g/dL    Albumin 1.4 (L) 3.5 - 5.0 g/dL    AST 25 15 - 41 U/L    ALT 27 0 - 44 U/L    Alkaline Phosphatase 96 38 - 126 U/L    Total Bilirubin 0.8 0.3 - 1.2 mg/dL    GFR calc non Af Amer 37 (L) >60 mL/min    GFR calc Af Amer 43 (L) >60 mL/min    Anion gap 8 5 - 15  CBC     Status: Abnormal    Collection Time: 09/22/19  2:04 AM  Result Value Ref Range    WBC 13.5 (H) 4.0 - 10.5 K/uL    RBC 2.92 (L) 3.87 - 5.11 MIL/uL    Hemoglobin 8.2 (L) 12.0 - 15.0 g/dL    HCT 56.2 (L) 13.0 - 46.0 %    MCV 88.4 80.0 - 100.0 fL    MCH 28.1 26.0 - 34.0 pg    MCHC 31.8 30.0 - 36.0 g/dL    RDW 86.5 (H) 78.4 - 15.5 %    Platelets 274 150 - 400 K/uL    nRBC 0.3 (H) 0.0 - 0.2 %  Magnesium     Status: None    Collection Time: 09/22/19  2:04 AM  Result Value Ref Range    Magnesium 1.9 1.7 -  2.4 mg/dL       Imaging Results (Last 48 hours)  CT ANGIO HEAD W OR WO CONTRAST   Result Date: 09/21/2019 CLINICAL DATA:  Follow-up stroke.  Left-sided weakness. EXAM: CT ANGIOGRAPHY HEAD AND NECK  TECHNIQUE: Multidetector CT imaging of the head and neck was performed using the standard protocol during bolus administration of intravenous contrast. Multiplanar CT image reconstructions and MIPs were obtained to evaluate the vascular anatomy. Carotid stenosis measurements (when applicable) are obtained utilizing NASCET criteria, using the distal internal carotid diameter as the denominator. CONTRAST:  OMNIPAQUE IOHEXOL 350 MG/ML SOLN COMPARISON:  MRI head and CT head 09/20/2019. CT angio head and neck 04/19/2019 FINDINGS: CTA NECK FINDINGS Aortic arch: Atherosclerotic aortic arch. Mild stenosis origin of left common carotid artery. Atherosclerotic calcification left subclavian artery. Moderate stenosis left subclavian artery distal to the vertebral origin. Right carotid system: Postop right carotid endarterectomy. No significant right carotid stenosis Left carotid system: Mild stenosis at the origin and proximal aspect of the left common carotid artery unchanged. Mild atherosclerotic disease left carotid bifurcation without significant stenosis. Vertebral arteries: Mild stenosis origin of right vertebral artery. No additional right vertebral artery stenosis to the skull base. Left vertebral artery patent to the skull base without significant stenosis. Skeleton: Mild cervical spine degenerative change. No acute skeletal abnormality. Compression fractures of T5 and T6 appear chronic. Other neck: Negative for mass or adenopathy in the neck. Upper chest: Chest CT from today reported separately. Review of the MIP images confirms the above findings CTA HEAD FINDINGS Anterior circulation: Atherosclerotic calcification in the cavernous carotid bilaterally. Moderate stenosis of the supraclinoid internal carotid artery on the left. Both anterior cerebral arteries are patent without stenosis. Hypoplastic left A1 segment. Moderate stenosis right M1 segment.  Right MCA branches patent. Moderate stenosis mid and distal  left M1 segment. Left MCA branches patent with focal moderate stenosis in the sylvian fissure M2 branch unchanged. Posterior circulation: Both vertebral arteries patent to the basilar. PICA patent bilaterally. Moderate stenosis left vertebral artery at the left PICA level. Atherosclerotic disease in the basilar artery with moderate stenosis proximally. This is unchanged. Moderate stenosis of the proximal P2 branch bilaterally. Additional moderate stenosis in the distal PCA bilaterally. No aneurysm identified as question previously. Venous sinuses: Normal venous enhancement Anatomic variants: None Review of the MIP images confirms the above findings IMPRESSION: 1. Postop right carotid endarterectomy which is widely patent. 2. Mild stenosis proximal left common carotid artery. No significant stenosis in the left carotid bifurcation 3. Both vertebral arteries patent to the basilar with moderate stenosis left V4 segment unchanged. In addition, there is moderate stenosis proximal basilar and moderate stenosis in the posterior cerebral arteries bilaterally. 4. Negative for intracranial large vessel occlusion. 5. Moderate stenosis in the middle cerebral arteries bilaterally unchanged. Electronically Signed   By: Marlan Palau M.D.   On: 09/21/2019 09:59    DG Abd 1 View   Result Date: 09/21/2019 CLINICAL DATA:  Obstipation EXAM: ABDOMEN - 1 VIEW COMPARISON:  09/13/2019 FINDINGS: Nonobstructed gas pattern with mild stool. Gas present in the rectum. Dystrophic gluteal calcifications. Small calcification in the left upper quadrant, felt to correspond to upper pole stone in the kidney. IMPRESSION: Nonobstructed bowel-gas pattern. Electronically Signed   By: Jasmine Pang M.D.   On: 09/21/2019 00:36    CT Head Wo Contrast   Result Date: 09/20/2019 CLINICAL DATA:  Left hand paralysis and numbness EXAM: CT HEAD WITHOUT CONTRAST TECHNIQUE: Contiguous axial images were obtained from  the base of the skull through the vertex  without intravenous contrast. COMPARISON:  MRI 04/18/2019, CT brain 04/18/2019 FINDINGS: Brain: No acute territorial infarction, hemorrhage or intracranial mass. Atrophy and chronic small vessel ischemic change of the white matter. Multiple bilateral chronic infarcts involving the white matter, basal ganglia and thalamus. Small focal hypodensity within the left cerebellum, not clearly seen on the previous exam and potentially representing age indeterminate lacunar infarct. Stable ventricle size. Vascular: No hyperdense vessels.  Carotid vascular calcification Skull: Normal. Negative for fracture or focal lesion. Sinuses/Orbits: No acute finding. Other: None IMPRESSION: 1. Negative for intracranial hemorrhage or territorial infarction. 2. Small focal hypodensity in the left cerebellum, may reflect age indeterminate lacunar infarct. 3. Atrophy, chronic small vessel ischemic changes of the white matter and multiple bilateral chronic appearing lacunar infarcts. Electronically Signed   By: Jasmine Pang M.D.   On: 09/20/2019 20:09    CT ANGIO NECK W OR WO CONTRAST   Result Date: 09/21/2019 CLINICAL DATA:  Follow-up stroke.  Left-sided weakness. EXAM: CT ANGIOGRAPHY HEAD AND NECK TECHNIQUE: Multidetector CT imaging of the head and neck was performed using the standard protocol during bolus administration of intravenous contrast. Multiplanar CT image reconstructions and MIPs were obtained to evaluate the vascular anatomy. Carotid stenosis measurements (when applicable) are obtained utilizing NASCET criteria, using the distal internal carotid diameter as the denominator. CONTRAST:  OMNIPAQUE IOHEXOL 350 MG/ML SOLN COMPARISON:  MRI head and CT head 09/20/2019. CT angio head and neck 04/19/2019 FINDINGS: CTA NECK FINDINGS Aortic arch: Atherosclerotic aortic arch. Mild stenosis origin of left common carotid artery. Atherosclerotic calcification left subclavian artery. Moderate stenosis left subclavian artery distal  to the vertebral origin. Right carotid system: Postop right carotid endarterectomy. No significant right carotid stenosis Left carotid system: Mild stenosis at the origin and proximal aspect of the left common carotid artery unchanged. Mild atherosclerotic disease left carotid bifurcation without significant stenosis. Vertebral arteries: Mild stenosis origin of right vertebral artery. No additional right vertebral artery stenosis to the skull base. Left vertebral artery patent to the skull base without significant stenosis. Skeleton: Mild cervical spine degenerative change. No acute skeletal abnormality. Compression fractures of T5 and T6 appear chronic. Other neck: Negative for mass or adenopathy in the neck. Upper chest: Chest CT from today reported separately. Review of the MIP images confirms the above findings CTA HEAD FINDINGS Anterior circulation: Atherosclerotic calcification in the cavernous carotid bilaterally. Moderate stenosis of the supraclinoid internal carotid artery on the left. Both anterior cerebral arteries are patent without stenosis. Hypoplastic left A1 segment. Moderate stenosis right M1 segment.  Right MCA branches patent. Moderate stenosis mid and distal left M1 segment. Left MCA branches patent with focal moderate stenosis in the sylvian fissure M2 branch unchanged. Posterior circulation: Both vertebral arteries patent to the basilar. PICA patent bilaterally. Moderate stenosis left vertebral artery at the left PICA level. Atherosclerotic disease in the basilar artery with moderate stenosis proximally. This is unchanged. Moderate stenosis of the proximal P2 branch bilaterally. Additional moderate stenosis in the distal PCA bilaterally. No aneurysm identified as question previously. Venous sinuses: Normal venous enhancement Anatomic variants: None Review of the MIP images confirms the above findings IMPRESSION: 1. Postop right carotid endarterectomy which is widely patent. 2. Mild stenosis  proximal left common carotid artery. No significant stenosis in the left carotid bifurcation 3. Both vertebral arteries patent to the basilar with moderate stenosis left V4 segment unchanged. In addition, there is moderate stenosis proximal basilar and moderate stenosis in  the posterior cerebral arteries bilaterally. 4. Negative for intracranial large vessel occlusion. 5. Moderate stenosis in the middle cerebral arteries bilaterally unchanged. Electronically Signed   By: Marlan Palau M.D.   On: 09/21/2019 09:59    CT ANGIO CHEST PE W OR WO CONTRAST   Result Date: 09/21/2019 CLINICAL DATA:  Shortness of breath EXAM: CT ANGIOGRAPHY CHEST WITH CONTRAST TECHNIQUE: Multidetector CT imaging of the chest was performed using the standard protocol during bolus administration of intravenous contrast. Multiplanar CT image reconstructions and MIPs were obtained to evaluate the vascular anatomy. CONTRAST:  50 mL OMNIPAQUE IOHEXOL 350 MG/ML SOLN COMPARISON:  Chest radiograph Sep 21, 2019; chest CT April 21, 2019 FINDINGS: Cardiovascular: There is no demonstrable pulmonary embolus. There is no thoracic aortic aneurysm or dissection. There are scattered foci of calcification in visualized great vessels. There are foci of aortic atherosclerosis. There are multiple foci of coronary artery calcification. There is no pericardial effusion or pericardial thickening. Mediastinum/Nodes: Thyroid appears unremarkable. There are scattered subcentimeter mediastinal lymph nodes. There are lymph nodes in the subcarinal region, largest measuring 1.2 x 1.2 cm. There are lymph nodes anterior to the carina, largest measuring 1.6 x 1.3 cm. There is an aortopulmonary window lymph node measuring 1.8 x 1.1 cm. There is a lymph node in the inferior right hilum measuring 1.1 x 1.0 cm. There is fluid in portions of the mid to distal esophagus. Lungs/Pleura: There is a degree of underlying centrilobular emphysematous change there is extensive  ground-glass type opacity throughout the lungs bilaterally, most severe in the upper lobes somewhat posteriorly but present to varying degrees throughout the lungs bilaterally. There are moderate free-flowing pleural effusions bilaterally as well. There is a degree of underlying fibrosis, primarily in the lung bases. There is also a degree of lower lobe bronchiectatic change Upper Abdomen: There is reflux of contrast into the inferior vena cava and hepatic veins. Visualized upper abdominal structures otherwise appear unremarkable. Musculoskeletal: There is slight anterior wedging of several lower thoracic vertebral bodies. There is no appreciable blastic or lytic bone lesion. No evident chest wall lesion. Review of the MIP images confirms the above findings. IMPRESSION: 1. No demonstrable pulmonary embolus. No thoracic aortic aneurysm or dissection. There is aortic atherosclerosis as well as foci of great vessel and coronary artery calcification. 2. Multifocal airspace opacity consistent with pneumonia throughout the lungs bilaterally. Moderate free-flowing pleural effusions. No appreciable consolidation. Question atypical organism pneumonia. In this regard, advise check of COVID-19 status. 3. Underlying degree of centrilobular emphysema. There is a degree of fibrosis, primarily in the lung bases as well as lower lobe bronchiectatic change. There may be a degree of underlying usual interstitial pneumonitis in the presence of emphysematous change. 4. Several enlarged lymph nodes which are of uncertain etiology. These lymph nodes potentially may have reactive etiology given the extensive parenchymal lung abnormalities. 5. Fluid in the mid to distal esophagus which may represent spontaneous reflux 1 motility disorder. 6. Reflux of contrast into the inferior vena cava and hepatic veins may be indicative of increased right heart pressure. Aortic Atherosclerosis (ICD10-I70.0) and Emphysema (ICD10-J43.9). Electronically  Signed   By: Bretta Bang III M.D.   On: 09/21/2019 09:41    MR BRAIN WO CONTRAST   Result Date: 09/20/2019 CLINICAL DATA:  Initial evaluation for acute left upper extremity weakness. EXAM: MRI HEAD WITHOUT CONTRAST MRI CERVICAL SPINE WITHOUT CONTRAST TECHNIQUE: Multiplanar, multiecho pulse sequences of the brain and surrounding structures, and cervical spine, to include the craniocervical junction and  cervicothoracic junction, were obtained without intravenous contrast. COMPARISON:  Comparison made with prior CT from earlier the same day. FINDINGS: MRI HEAD FINDINGS Brain: Generalized age-related cerebral atrophy. Extensive chronic microvascular ischemic disease with multiple remote lacunar infarcts seen clustered about the bilateral basal ganglia and thalami. Patchy involvement of the pons noted as well. 8 mm focus of restricted diffusion involving the left caudate consistent with an acute ischemic infarct (series 5, image 8). Associated susceptibility artifact suggests petechial hemorrhage without frank hemorrhagic transformation. No additional punctate 4 mm cortical infarct noted at the left occipital lobe (a series 5, image 76). No associated hemorrhage or mass effect. Additional faint subcentimeter diffusion abnormality at the high right frontal centrum semi ovale consistent with subacute small vessel ischemia. Mild residual diffusion abnormality seen about a largely chronic appearing lacunar infarct at the posterior right basal ganglia. Additional 12 mm vague focus of diffusion abnormality involving the left cerebellum consistent with a subacute left cerebellar infarct. No associated hemorrhage or mass effect. Multiple scattered chronic micro hemorrhages seen involving both cerebral and cerebellar hemispheres, most notable about the deep gray nuclei, favored to be related to chronic poorly controlled hypertension. Sequelae of cerebral amyloid would be the primary differential consideration. No mass  lesion, midline shift or mass effect. No hydrocephalus. No extra-axial fluid collection. Pituitary gland suprasellar region normal. Midline structures intact. Vascular: Major intracranial vascular flow voids are maintained. Skull and upper cervical spine: Craniocervical junction within normal limits. Bone marrow signal intensity normal. No scalp soft tissue abnormality. Sinuses/Orbits: Globes and orbital soft tissues within normal limits. Paranasal sinuses are largely clear. No significant mastoid effusion. Other: None. MRI CERVICAL SPINE FINDINGS Alignment: Vertebral bodies normally aligned with preservation of the normal cervical lordosis. No listhesis. Vertebrae: Vertebral body height maintained without evidence for acute or chronic fracture. Bone marrow signal intensity somewhat heterogeneous but within normal limits. No discrete or worrisome osseous lesions. No abnormal marrow edema. Cord: Signal intensity within the cervical spinal cord is within normal limits. Posterior Fossa, vertebral arteries, paraspinal tissues: Craniocervical junction within normal limits. Paraspinous and prevertebral soft tissues normal. Normal flow voids seen within the vertebral arteries bilaterally. Disc levels: C2-C3: Unremarkable. C3-C4: Broad central disc protrusion indents the ventral thecal sac, partially effacing the ventral CSF. Mild spinal stenosis without cord deformity. Superimposed uncovertebral hypertrophy without significant foraminal encroachment. C4-C5: Diffuse degenerative disc osteophyte with bilateral uncovertebral hypertrophy. Broad posterior component flattens and effaces the ventral thecal sac, greater on the left. Superimposed mild facet and ligament flavum hypertrophy. Resultant moderate spinal stenosis with mild cord flattening. Left greater than right uncinate spurring with resultant moderate left worse than right C5 foraminal stenosis. C5-C6: Chronic intervertebral disc space narrowing with diffuse  degenerative disc osteophyte. Broad posterior component flattens and partially faces the ventral thecal sac resultant mild spinal stenosis. No cord deformity. Severe right with mild left C6 foraminal stenosis. C6-C7: Mild disc bulge with uncovertebral hypertrophy. No significant spinal stenosis. Moderate right C7 foraminal stenosis. No significant left foraminal narrowing. C7-T1:  Unremarkable. Visualized upper thoracic spine demonstrates no significant finding. IMPRESSION: MRI HEAD IMPRESSION: 1. 8 mm acute ischemic left basal ganglia infarct. Associated petechial hemorrhage without frank hemorrhagic transformation. 2. Additional small volume subacute ischemic changes involving the high right frontal centrum semi ovale and left cerebellum as above. 3. Underlying severe chronic microvascular ischemic disease with multiple remote lacunar infarcts involving the deep gray nuclei and pons. 4. Multiple scattered chronic micro hemorrhages involving both cerebral and cerebellar hemispheres, most pronounced about the deep  gray nuclei. Changes related to chronic poorly controlled hypertension is favored. Cerebral amyloid would be the primary differential consideration. MRI CERVICAL SPINE IMPRESSION: 1. Degenerative disc osteophyte at C4-5 with resultant moderate spinal stenosis, with moderate left worse than right C5 foraminal narrowing. Finding could contribute to left-sided radicular symptoms. 2. Disc bulge with uncovertebral spurring at C5-6 and C6-7 with resultant moderate to severe right C6 and C7 foraminal stenosis. 3. Broad-based central disc protrusion at C3-4 with resultant mild spinal stenosis. Electronically Signed   By: Rise Mu M.D.   On: 09/20/2019 22:41    MR Cervical Spine Wo Contrast   Result Date: 09/20/2019 CLINICAL DATA:  Initial evaluation for acute left upper extremity weakness. EXAM: MRI HEAD WITHOUT CONTRAST MRI CERVICAL SPINE WITHOUT CONTRAST TECHNIQUE: Multiplanar, multiecho pulse  sequences of the brain and surrounding structures, and cervical spine, to include the craniocervical junction and cervicothoracic junction, were obtained without intravenous contrast. COMPARISON:  Comparison made with prior CT from earlier the same day. FINDINGS: MRI HEAD FINDINGS Brain: Generalized age-related cerebral atrophy. Extensive chronic microvascular ischemic disease with multiple remote lacunar infarcts seen clustered about the bilateral basal ganglia and thalami. Patchy involvement of the pons noted as well. 8 mm focus of restricted diffusion involving the left caudate consistent with an acute ischemic infarct (series 5, image 8). Associated susceptibility artifact suggests petechial hemorrhage without frank hemorrhagic transformation. No additional punctate 4 mm cortical infarct noted at the left occipital lobe (a series 5, image 76). No associated hemorrhage or mass effect. Additional faint subcentimeter diffusion abnormality at the high right frontal centrum semi ovale consistent with subacute small vessel ischemia. Mild residual diffusion abnormality seen about a largely chronic appearing lacunar infarct at the posterior right basal ganglia. Additional 12 mm vague focus of diffusion abnormality involving the left cerebellum consistent with a subacute left cerebellar infarct. No associated hemorrhage or mass effect. Multiple scattered chronic micro hemorrhages seen involving both cerebral and cerebellar hemispheres, most notable about the deep gray nuclei, favored to be related to chronic poorly controlled hypertension. Sequelae of cerebral amyloid would be the primary differential consideration. No mass lesion, midline shift or mass effect. No hydrocephalus. No extra-axial fluid collection. Pituitary gland suprasellar region normal. Midline structures intact. Vascular: Major intracranial vascular flow voids are maintained. Skull and upper cervical spine: Craniocervical junction within normal limits.  Bone marrow signal intensity normal. No scalp soft tissue abnormality. Sinuses/Orbits: Globes and orbital soft tissues within normal limits. Paranasal sinuses are largely clear. No significant mastoid effusion. Other: None. MRI CERVICAL SPINE FINDINGS Alignment: Vertebral bodies normally aligned with preservation of the normal cervical lordosis. No listhesis. Vertebrae: Vertebral body height maintained without evidence for acute or chronic fracture. Bone marrow signal intensity somewhat heterogeneous but within normal limits. No discrete or worrisome osseous lesions. No abnormal marrow edema. Cord: Signal intensity within the cervical spinal cord is within normal limits. Posterior Fossa, vertebral arteries, paraspinal tissues: Craniocervical junction within normal limits. Paraspinous and prevertebral soft tissues normal. Normal flow voids seen within the vertebral arteries bilaterally. Disc levels: C2-C3: Unremarkable. C3-C4: Broad central disc protrusion indents the ventral thecal sac, partially effacing the ventral CSF. Mild spinal stenosis without cord deformity. Superimposed uncovertebral hypertrophy without significant foraminal encroachment. C4-C5: Diffuse degenerative disc osteophyte with bilateral uncovertebral hypertrophy. Broad posterior component flattens and effaces the ventral thecal sac, greater on the left. Superimposed mild facet and ligament flavum hypertrophy. Resultant moderate spinal stenosis with mild cord flattening. Left greater than right uncinate spurring with resultant moderate left  worse than right C5 foraminal stenosis. C5-C6: Chronic intervertebral disc space narrowing with diffuse degenerative disc osteophyte. Broad posterior component flattens and partially faces the ventral thecal sac resultant mild spinal stenosis. No cord deformity. Severe right with mild left C6 foraminal stenosis. C6-C7: Mild disc bulge with uncovertebral hypertrophy. No significant spinal stenosis. Moderate right  C7 foraminal stenosis. No significant left foraminal narrowing. C7-T1:  Unremarkable. Visualized upper thoracic spine demonstrates no significant finding. IMPRESSION: MRI HEAD IMPRESSION: 1. 8 mm acute ischemic left basal ganglia infarct. Associated petechial hemorrhage without frank hemorrhagic transformation. 2. Additional small volume subacute ischemic changes involving the high right frontal centrum semi ovale and left cerebellum as above. 3. Underlying severe chronic microvascular ischemic disease with multiple remote lacunar infarcts involving the deep gray nuclei and pons. 4. Multiple scattered chronic micro hemorrhages involving both cerebral and cerebellar hemispheres, most pronounced about the deep gray nuclei. Changes related to chronic poorly controlled hypertension is favored. Cerebral amyloid would be the primary differential consideration. MRI CERVICAL SPINE IMPRESSION: 1. Degenerative disc osteophyte at C4-5 with resultant moderate spinal stenosis, with moderate left worse than right C5 foraminal narrowing. Finding could contribute to left-sided radicular symptoms. 2. Disc bulge with uncovertebral spurring at C5-6 and C6-7 with resultant moderate to severe right C6 and C7 foraminal stenosis. 3. Broad-based central disc protrusion at C3-4 with resultant mild spinal stenosis. Electronically Signed   By: Rise Mu M.D.   On: 09/20/2019 22:41    DG CHEST PORT 1 VIEW   Result Date: 09/21/2019 CLINICAL DATA:  Left hand paralysis weakness EXAM: PORTABLE CHEST 1 VIEW COMPARISON:  09/11/2019, 04/19/2019, CT chest 04/21/2019 FINDINGS: Diffuse bilateral reticular and ground-glass opacity. Stable cardiomediastinal silhouette. No pleural effusion or pneumothorax. IMPRESSION: Interval diffuse bilateral interstitial and ground-glass opacity some of which is due to chronic change though suspect acute superimposed process such as possible atypical or viral pneumonia. Electronically Signed   By: Jasmine Pang M.D.   On: 09/21/2019 00:34    ECHOCARDIOGRAM COMPLETE   Result Date: 09/22/2019    ECHOCARDIOGRAM REPORT   Patient Name:   YENG FRANKIE Date of Exam: 09/22/2019 Medical Rec #:  [id number]        Height:       61.0 in Accession #:    [id number]       Weight:       107.6 lb Date of Birth:  09/02/1945         BSA:          1.451 m Patient Age:    74 years         BP:           169/86 mmHg Patient Gender: F                HR:           77 bpm. Exam Location:  Inpatient Procedure: 2D Echo Indications:    stroke 434.91  History:        Patient has prior history of Echocardiogram examinations, most                 recent 04/19/2019. Risk Factors:Hypertension.  Sonographer:    Delcie Roch Referring Phys: [id number] ANASTASSIA DOUTOVA IMPRESSIONS  1. Left ventricular ejection fraction, by estimation, is 55 to 60%. The left ventricle has normal function. The left ventricle has no regional wall motion abnormalities. Left ventricular diastolic parameters were normal.  2. Right ventricular systolic function is normal. The right ventricular  size is moderately enlarged. There is moderately elevated pulmonary artery systolic pressure. The estimated right ventricular systolic pressure is 45.0 mmHg.  3. Left atrial size was moderately dilated.  4. Right atrial size was mildly dilated.  5. The mitral valve is normal in structure. Mild to moderate mitral valve regurgitation. No evidence of mitral stenosis.  6. The aortic valve is normal in structure. Aortic valve regurgitation is not visualized. No aortic stenosis is present.  7. Tricuspid valve regurgitation is moderate.  8. Pulmonic valve regurgitation is moderate.  9. The inferior vena cava is normal in size with greater than 50% respiratory variability, suggesting right atrial pressure of 3 mmHg. FINDINGS  Left Ventricle: Left ventricular ejection fraction, by estimation, is 55 to 60%. The left ventricle has normal function. The left ventricle has no regional wall  motion abnormalities. The left ventricular internal cavity size was normal in size. There is  no left ventricular hypertrophy. Left ventricular diastolic parameters were normal. Right Ventricle: The right ventricular size is moderately enlarged. No increase in right ventricular wall thickness. Right ventricular systolic function is normal. There is moderately elevated pulmonary artery systolic pressure. The tricuspid regurgitant  velocity is 3.24 m/s, and with an assumed right atrial pressure of 3 mmHg, the estimated right ventricular systolic pressure is 45.0 mmHg. Left Atrium: Left atrial size was moderately dilated. Right Atrium: Right atrial size was mildly dilated. Pericardium: Trivial pericardial effusion is present. Mitral Valve: The mitral valve is normal in structure. Normal mobility of the mitral valve leaflets. Mild mitral annular calcification. Mild to moderate mitral valve regurgitation. No evidence of mitral valve stenosis. Tricuspid Valve: The tricuspid valve is normal in structure. Tricuspid valve regurgitation is moderate . No evidence of tricuspid stenosis. Aortic Valve: The aortic valve is normal in structure. Aortic valve regurgitation is not visualized. No aortic stenosis is present. Pulmonic Valve: The pulmonic valve was normal in structure. Pulmonic valve regurgitation is moderate. No evidence of pulmonic stenosis. Aorta: The aortic root is normal in size and structure. Venous: The inferior vena cava is normal in size with greater than 50% respiratory variability, suggesting right atrial pressure of 3 mmHg. IAS/Shunts: No atrial level shunt detected by color flow Doppler.  LEFT VENTRICLE PLAX 2D LVIDd:         4.16 cm     Diastology LVIDs:         2.56 cm     LV e' lateral:   10.10 cm/s LV PW:         0.95 cm     LV E/e' lateral: 9.1 LV IVS:        0.74 cm     LV e' medial:    8.49 cm/s LVOT diam:     1.90 cm     LV E/e' medial:  10.8 LV SV:         51 LV SV Index:   35 LVOT Area:     2.84 cm   LV Volumes (MOD) LV vol d, MOD A4C: 46.2 ml LV vol s, MOD A4C: 20.7 ml LV SV MOD A4C:     46.2 ml RIGHT VENTRICLE             IVC RV S prime:     13.50 cm/s  IVC diam: 1.67 cm TAPSE (M-mode): 1.9 cm LEFT ATRIUM             Index       RIGHT ATRIUM  Index LA diam:        3.50 cm 2.41 cm/m  RA Area:     15.00 cm LA Vol (A2C):   51.7 ml 35.63 ml/m RA Volume:   38.80 ml  26.74 ml/m LA Vol (A4C):   69.1 ml 47.62 ml/m LA Biplane Vol: 60.9 ml 41.96 ml/m  AORTIC VALVE LVOT Vmax:   92.60 cm/s LVOT Vmean:  54.300 cm/s LVOT VTI:    0.180 m  AORTA Ao Root diam: 2.60 cm MITRAL VALVE               TRICUSPID VALVE MV Area (PHT): 4.39 cm    TR Peak grad:   42.0 mmHg MV Decel Time: 173 msec    TR Vmax:        324.00 cm/s MV E velocity: 91.70 cm/s MV A velocity: 93.60 cm/s  SHUNTS MV E/A ratio:  0.98        Systemic VTI:  0.18 m                            Systemic Diam: 1.90 cm Cherlynn Kaiser MD Electronically signed by Cherlynn Kaiser MD Signature Date/Time: 09/22/2019/11:21:18 AM    Final     VAS [place] LOWER EXTREMITY VENOUS (DVT)   Result Date: 09/22/2019  Lower Venous DVTStudy Indications: Stroke.  Risk Factors: None identified. Limitations: Patient positioning, patient immobility. Comparison Study: No prior studies. Performing Technologist: Oliver Hum RVT  Examination Guidelines: A complete evaluation includes B-mode imaging, spectral Doppler, color Doppler, and power Doppler as needed of all accessible portions of each vessel. Bilateral testing is considered an integral part of a complete examination. Limited examinations for reoccurring indications may be performed as noted. The reflux portion of the exam is performed with the patient in reverse Trendelenburg.  +---------+---------------+---------+-----------+----------+--------------+ RIGHT    CompressibilityPhasicitySpontaneityPropertiesThrombus Aging +---------+---------------+---------+-----------+----------+--------------+ CFV      Full            Yes      Yes                                 +---------+---------------+---------+-----------+----------+--------------+ SFJ      Full                                                        +---------+---------------+---------+-----------+----------+--------------+ FV Prox  Full                                                        +---------+---------------+---------+-----------+----------+--------------+ FV Mid   Full                                                        +---------+---------------+---------+-----------+----------+--------------+ FV DistalFull                                                        +---------+---------------+---------+-----------+----------+--------------+  PFV      Full                                                        +---------+---------------+---------+-----------+----------+--------------+ POP      Full           Yes      Yes                                 +---------+---------------+---------+-----------+----------+--------------+ PTV      Full                                                        +---------+---------------+---------+-----------+----------+--------------+ PERO     Full                                                        +---------+---------------+---------+-----------+----------+--------------+   +---------+---------------+---------+-----------+----------+--------------+ LEFT     CompressibilityPhasicitySpontaneityPropertiesThrombus Aging +---------+---------------+---------+-----------+----------+--------------+ CFV      Full           Yes      Yes                                 +---------+---------------+---------+-----------+----------+--------------+ SFJ      Full                                                        +---------+---------------+---------+-----------+----------+--------------+ FV Prox  Full                                                         +---------+---------------+---------+-----------+----------+--------------+ FV Mid   Full                                                        +---------+---------------+---------+-----------+----------+--------------+ FV DistalFull                                                        +---------+---------------+---------+-----------+----------+--------------+ PFV      Full                                                        +---------+---------------+---------+-----------+----------+--------------+  POP      Full           Yes      Yes                                 +---------+---------------+---------+-----------+----------+--------------+ PTV      Full                                                        +---------+---------------+---------+-----------+----------+--------------+ PERO     Full                                                        +---------+---------------+---------+-----------+----------+--------------+     Summary: RIGHT: - There is no evidence of deep vein thrombosis in the lower extremity.  - No cystic structure found in the popliteal fossa.  LEFT: - There is no evidence of deep vein thrombosis in the lower extremity.  - No cystic structure found in the popliteal fossa.  *See table(s) above for measurements and observations.    Preliminary      Assessment/Plan: Diagnosis: Embolic infarcts, multifocal, anterior and posterior 1. Does the need for close, 24 hr/day medical supervision in concert with the patient's rehab needs make it unreasonable for this patient to be served in a less intensive setting? Yes 2. 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 Due to bladder management, bowel management, safety, skin/wound care, disease management, medication administration, pain management and patient  education, does the patient require 24 hr/day rehab nursing? Yes 4. Does the patient require coordinated care of a physician, rehab nurse, therapy disciplines of PT, OT, SLP to address physical and functional deficits in the context of the above medical diagnosis(es)? Yes Addressing deficits in the following areas: balance, endurance, locomotion, strength, transferring, bowel/bladder control, bathing, dressing, feeding, grooming, toileting, cognition, speech and psychosocial support 5. Can the patient actively participate in an intensive therapy program of at least 3 hrs of therapy per day at least 5 days per week? Yes 6. The potential for patient to make measurable gains while on inpatient rehab is excellent 7. Anticipated functional outcomes upon discharge from inpatient rehab are min assist  with PT, min assist with OT, min assist with SLP. 8. Estimated rehab length of stay to reach the above functional goals is: 10-14 days 9. Anticipated discharge destination: Home 10. Overall Rehab/Functional Prognosis: excellent   RECOMMENDATIONS: This patient's condition is appropriate for continued rehabilitative care in the following setting: CIR Patient has agreed to participate in recommended program. Yes Note that insurance prior authorization may be required for reimbursement for recommended care.   Comment: Patricia Stanley was with [place] in CIR for her CVA in December. She did very well at that time but has unfortunately suffered a new multifocal embolic stroke, and would greatly benefit from CIR again. Would recommend left wrist splint and left PRAFO boot. Thank you for this consult. We will continue to follow in Patricia Stanley's care.    Mcarthur Rossetti Angiulli, PA-C 09/22/2019  I have personally performed a face to face diagnostic evaluation, including, but not limited to relevant history and physical exam findings, of this patient and developed relevant assessment and plan.  Additionally, I have reviewed and  concur with the physician assistant's documentation above.   Sula Soda, MD        Revision History                     Routing History

## 2019-09-27 NOTE — Progress Notes (Signed)
NAME:  Patricia Stanley, MRN:  [id number], DOB:  1945-12-10, LOS: 6 ADMISSION DATE:  09/20/2019, CONSULTATION DATE:  09/23/19 REFERRING MD:  Dr. Sharon Seller, CHIEF COMPLAINT:  Abnormal CT chest   Brief History   74 year old female with recent stroke admitted with worsening L sided weakness and found to have new strokes of embolic pattern. Course complicated by hypoxia and CT scan was abnormal with concerns for interstitial disease.   History of present illness   74 year old female with PMH as below, which is significant for admission for stroke in December of 2020. She was found to have carotid artery stenosis and underwent endarterectomy. She was discharged from that admission with some residual left sided deficits. She was then again admitted 5/9 > 5/14 with failure to thrive and chronic diarrhea. She was seen by GI who started Creon, cholestyramine, and imodium. Also had antidepressant changed to stimulate appetite. She again presented 5/18 from home with complaints of worsening left sided weakness. Imaging done in the ED demonstrated multifocal punctate anterior and posterior infarcts. She was admitted for neurology evaluation. Notably she was also newly hypoxic requiring 2L [place] to maintain O2 sats > 85%. CT imaging done demonstrating interstitial process. PCCM consulted.   She describes progressive dyspnea essentially since her stroke in December. Never had any cough or fever. Does not appreciate any trouble swallowing despite notes saying she struggled with breakfast on 5/20. Husband says this is rare. She is a former smoker, quit 20 years ago. She has not worked for 20 years either. She worked at [employer], but did not have any exposure to [employer] process. No dusts or chemicals. Currently she does get dyspneic with talking. Generally comfortable at rest.   Past Medical History   has a past medical history of Closed fracture of coccyx (HCC) (2015), Depression, GERD (gastroesophageal  reflux disease), Headache, History of kidney stones, and Hypertension.   Significant Hospital Events   5/9 > 5/14 admit for FTT.  5/18 admit with new strokes  Consults:  Neurology (stroke) Pulmonary  Procedures:    Significant Diagnostic Tests:  5/18 CT head > Negative for intracranial hemorrhage or territorial infarction. Small focal hypodensity in the left cerebellum, may reflect age indeterminate lacunar infarct. 5/18 MRI brain > 8 mm acute ischemic left basal ganglia infarct. Associated petechial hemorrhage without frank hemorrhagic transformation. Additional small volume subacute ischemic changes involving the high right frontal centrum semi ovale and left cerebellum as above. Multiple scattered chronic micro hemorrhages involving both cerebral and cerebellar hemispheres, most pronounced about the deep gray nuclei. CTA chest 5/19 > Multifocal airspace opacity consistent with pneumonia throughout the lungs bilaterally. Underlying degree of centrilobular emphysema. There is a degree of fibrosis, primarily in the lung bases as well as lower lobe bronchiectatic change.  Micro Data:  COVID negative  Antimicrobials:    Interim history/subjective:  Denies any significant symptoms Feels weak Had a decent night No cough  Objective   Blood pressure (!) 166/85, pulse 88, temperature 98.6 F (37 C), temperature source Oral, resp. rate 18, SpO2 97 %.        Intake/Output Summary (Last 24 hours) at 09/27/2019 0729 Last data filed at 09/27/2019 1540 Gross per 24 hour  Intake 360 ml  Output 1000 ml  Net -640 ml   There were no vitals filed for this visit.  Examination: General: Frail, elderly, chronically ill-appearing HENT: Dry oral mucosa Lungs: Clear breath sounds anteriorly.  Cardiovascular: S1-S2 appreciated with no  murmur  abdomen: Soft, non-tender, non-distended Extremities: No acute deformity. Limited mobility on the L.  Neuro: Alert, oriented. Weakness to left upper  and lower extremities.   Resolved Hospital Problem list     Assessment & Plan:   CVA: acute multifocal punctate infarcts in the background of R MCA stoke in December of last year. Felt to be embolic in nature.  - Management per primary and Stroke service -Appears to be stable  Acute hypoxemic respiratory failure: seems to be progressive over the past few months. CT from this admission shows marked change vs progression from previous imaging in December.  There is evidence of bronchiectasis on prior imaging, seems stable.  Chronic aspiration fits the bill, but SLP eval 5/21 was negative and GERD therapy is for the most part maximized. 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 CCP negative, Histone antibody pending.  -Saturations appear to be stable, looks like she may be able to be off oxygen -Close clinical monitoring is appropriate - Histone antibody pending.  -SCL 70 sent off -Myositis panel requested  Bilateral pleural effusion: small. Likely due to malnourishment. Albumin 1.  -On cautious diuresis -Keep an eye on electrolytes  Failure to thrive: appetite seems to be improving. She has been deemed a candidate for CIR.   Pulmonary hypertension, -Group 2 and group 3-diastolic dysfunction on echo from 04/19/2019, pulmonary pressures elevated on recent echocardiogram - hypoxemia from underlying lung disease  Plan will be to follow-up in the pulmonary clinic following discharge -Characterization of underlying lung disease will depend on the markers already sent off  Continue supportive measures Continue cautious diuresis  Best practice:  Diet: Per primary Pain/Anxiety/Delirium protocol (if indicated): NA VAP protocol (if indicated): NA DVT prophylaxis: Per primary GI prophylaxis: Per primary Mobility: PT Code Status: DNR Family Communication: Patient and husband updated bedside Disposition: Med-surg  Labs    CBC: Recent Labs  Lab 09/20/19 2013 09/21/19 5176 09/22/19 0204 09/23/19 0423 09/24/19 0326 09/25/19 0416 09/27/19 0547  WBC 10.6*   < > 13.5* 12.5* 11.9* 9.6 14.3*  NEUTROABS 8.2*  --   --   --   --   --   --   HGB 7.1*   < > 8.2* 8.0* 7.9* 7.3* 7.8*  HCT 23.5*   < > 25.8* 25.7* 25.1* 23.6* 24.7*  MCV 92.9   < > 88.4 89.5 89.3 90.4 90.1  PLT 313   < > 274 265 218 208 219   < > = values in this interval not displayed.    Basic Metabolic Panel: Recent Labs  Lab 09/20/19 2013 09/21/19 1607 09/22/19 3710 09/23/19 0423 09/24/19 0326 09/25/19 0416 09/27/19 0547  NA   < >  --  140 140 138 140 142  K   < >  --  4.6 4.2 4.1 3.9 4.3  CL   < >  --  111 112* 106 111 104  CO2   < >  --  21* 21* 24 25 28   GLUCOSE   < >  --  104* 105* 106* 91 112*  BUN   < >  --  21 24* 30* 37* 30*  CREATININE   < >  --  1.40* 1.19* 1.41* 1.33* 1.32*  CALCIUM   < >  --  7.8* 8.2* 8.5* 8.3* 8.6*  MG  --  1.2* 1.9 1.6* 1.9  --   --   PHOS  --  2.7  --   --   --   --   --    < > =  values in this interval not displayed.   GFR: Estimated Creatinine Clearance: 28.6 mL/min (A) (by C-G formula based on SCr of 1.32 mg/dL (H)). Recent Labs  Lab 09/21/19 0245 09/21/19 0821 09/23/19 0423 09/24/19 0326 09/25/19 0416 09/27/19 0547  PROCALCITON 0.17  --   --   --   --   --   WBC  --    < > 12.5* 11.9* 9.6 14.3*   < > = values in this interval not displayed.    Liver Function Tests: Recent Labs  Lab 09/21/19 0115 09/22/19 0204 09/23/19 0423 09/24/19 0326 09/27/19 0547  AST 43* 25 26 30  41  ALT 39 27 22 20 25   ALKPHOS 94 96 127* 132* 153*  BILITOT 0.9 0.8 0.9 0.4 0.9  PROT 5.3* 4.8* 4.6* 4.7* 5.4*  ALBUMIN 1.6* 1.4* 1.2* 1.3* 1.5*    HbA1C:  , MD [place] PCCM Pager: [phone]

## 2019-09-27 NOTE — TOC Transition Note (Signed)
Transition of Care [facility]) - CM/SW Discharge Note   Patient Details  Name: [name] MRN: [id number] Date of Birth: 1946-01-01  [facility]) CM/SW Contact:  Kermit Balo, RN Phone Number: 09/27/2019, 10:14 AM   Clinical Narrative:    Pt discharging to CIR today. CM signing off.    Final next level of care: IP Rehab Facility Barriers to Discharge: No Barriers Identified   Patient Goals and CMS Choice        Discharge Placement                       Discharge Plan and Services                                     Social Determinants of Health (SDOH) Interventions     Readmission Risk Interventions Readmission Risk Prevention Plan 09/15/2019  Transportation Screening Complete  PCP or Specialist Appt within 5-7 Days Complete  Home Care Screening Complete  Medication Review (RN CM) Complete  Some recent data might be hidden

## 2019-09-27 NOTE — IPOC Note (Signed)
Individualized overall Plan of Care (IPOC) Patient Details Name: Patricia Stanley MRN: [id number] DOB: 07-06-1945  Admitting Diagnosis: CVA (cerebral vascular accident) [facility])  Hospital Problems: Principal Problem:   CVA (cerebral vascular accident) [facility]) Active Problems:   Spinal stenosis in cervical region   Anemia of chronic disease   Chronic diarrhea   Supplemental oxygen dependent   Hypoalbuminemia due to protein-calorie malnutrition (HCC)   Prediabetes   Leukocytosis   AKI (acute kidney injury) (HCC)   Transaminitis     Functional Problem List: Nursing Bladder, Bowel, Endurance, Motor, Nutrition, Pain, Perception, Safety, Skin Integrity  PT Balance, Behavior, Motor, Endurance, Safety, Sensory  OT Balance, Pain, Perception, Cognition, Safety, Sensory, Endurance, Skin Integrity, Motor, Vision  SLP Cognition  TR         Basic ADL's: OT Grooming, Bathing, Dressing, Toileting     Advanced  ADL's: OT       Transfers: PT Bed Mobility, Bed to Chair, Car, Occupational psychologist, Research scientist (life sciences): PT Ambulation, Psychologist, prison and probation services, Stairs     Additional Impairments: OT Fuctional Use of Upper Extremity  SLP Social Cognition   Awareness, Attention, Memory, Problem Solving  TR      Anticipated Outcomes Item Anticipated Outcome  Self Feeding independent  Swallowing      Basic self-care  min assist  Toileting  min assist   Bathroom Transfers min assist  Bowel/Bladder  maintain regular pattern of emptying bowel and bladder.  Transfers  CGA  Locomotion  CGA  Communication     Cognition  Supervision A  Pain  less than 3  Safety/Judgment  Remain free of falls, skin breakdown and infection   Therapy Plan: PT Intensity: Minimum of 1-2 x/day ,45 to 90 minutes PT Frequency: 5 out of 7 days PT Duration Estimated Length of Stay: 14-18 days OT Intensity: Minimum of 1-2 x/day, 45 to 90 minutes OT Frequency: 5 out of 7 days OT Duration/Estimated  Length of Stay: 12 to 14 days SLP Intensity: Minumum of 1-2 x/day, 30 to 90 minutes SLP Frequency: 1 to 3 out of 7 days SLP Duration/Estimated Length of Stay: 2-2.5 weeks    Team Interventions: Nursing Interventions Patient/Family Education, Pain Management, Bladder Management, Medication Management, Discharge Planning, Bowel Management, Skin Care/Wound Management, Psychosocial Support, Disease Management/Prevention  PT interventions Ambulation/gait training, Community reintegration, DME/adaptive equipment instruction, Neuromuscular re-education, UE/LE Strength taining/ROM, Museum/gallery curator, Warden/ranger, Wheelchair propulsion/positioning, Discharge planning, Pain management, Skin care/wound management, Therapeutic Activities, UE/LE Coordination activities, Disease management/prevention, Functional mobility training, Patient/family education, Splinting/orthotics, Therapeutic Exercise, Visual/perceptual remediation/compensation, Cognitive remediation/compensation, Psychosocial support  OT Interventions Balance/vestibular training, Discharge planning, Functional electrical stimulation, Therapeutic Activities, Self Care/advanced ADL retraining, Pain management, UE/LE Coordination activities, Cognitive remediation/compensation, Disease mangement/prevention, Functional mobility training, Patient/family education, Therapeutic Exercise, Visual/perceptual remediation/compensation, DME/adaptive equipment instruction, Neuromuscular re-education, Psychosocial support, Splinting/orthotics, UE/LE Strength taining/ROM, Wheelchair propulsion/positioning  SLP Interventions Cognitive remediation/compensation, Financial trader, Functional tasks, Patient/family education, Internal/external aids  TR Interventions    SW/CM Interventions Discharge Planning, Psychosocial Support, Patient/Family Education   Barriers to Discharge MD  Medical stability and New oxygen  Nursing Weight patient has lost a lot of  weight due to diarrhea  PT Inaccessible home environment, Nutrition means 6 STE  OT      SLP      SW Medical stability, Home environment access/layout     Team Discharge Planning: Destination: PT-Home ,OT- Home , SLP-Home Projected Follow-up: PT-Home health PT, OT-  Home health OT, SLP-Other (comment)(TBD) Projected Equipment  Needs: PT-To be determined, OT- None recommended by OT, SLP-None recommended by SLP Equipment Details: PT- , OT-pt has needed DME Patient/family involved in discharge planning: PT- Patient,  OT-Family member/caregiver, Patient, SLP-Patient, Family member/caregiver  MD ELOS: 17-22 days. Medical Rehab Prognosis:  Good Assessment: Right-handed female history of CVA December 2020 with residual left-sided weakness and received inpatient rehab services 04/25/2019 to 05/10/2019 and maintained on aspirin, hyperlipidemia, chronic normocytic anemia, hypertension, depression, chronic diarrhea, carotid artery stenosis with CEA 04/2019. Presented 09/20/2019 with increased left arm weakness and numbness as well as left leg weakness with associated dyspnea and unintentional weight loss.  Cranial CT scan negative for intracranial hemorrhage or territorial infarct.  Small focal hypodensity in the left cerebellum reflecting possible indeterminate lacunar infarction.  Patient did not receive TPA.  MRI showed 8 mm acute ischemic left basal ganglia infarction.  Associated petechial hemorrhage without frank hemorrhagic transformation.  Multiple scattered chronic microhemorrhages involving both cerebral and cerebellar hemispheres most pronounced about the deep gray nuclei.  MRI cervical spine degenerative disc osteophyte C4-5 with resultant moderate spinal stenosis.  CT angiogram of the chest showed no demonstration of pulmonary emboli.  CT angiogram head and neck right carotid enterectomy widely patent.  No significant stenosis in the left carotid bifurcation.  Negative for intracranial large vessel  occlusion.  CT abdomen pelvis showed no hydronephrosis, moderate colonic stool burden otherwise unremarkable.  Echocardiogram with ejection fraction of 60% no wall motion abnormalities.  Noted admission chemistries hemoglobin 7.1 as well as hemoglobin 7.2 09/16/2019, WBC 10.6, chloride 113, BUN 22, urinalysis negative nitrite, troponin 33, SARS coronavirus negative, sedimentation rate 29.  Venous Doppler studies negative for DVT.  Maintained on aspirin 325 mg daily for CVA prophylaxis.  No DAPT due to anemia.  Her latest hemoglobin was 7.3.   Patient was transfused 1 unit of packed red blood cells.  Patient had recently been seen by Dr. Paulita Fujita for gastroenterology services for history of anemia did not feel any work-up was needed as she appeared to be stable hemoglobin over the last couple of years.  Patient not felt to be a candidate at this time for endoscopy or colonoscopy.  Patient with resulting functional deficits with mobility, endurance, self-care, endurance.  Will set goals for CGA with PT, Min A with OT, and supervision with SLP.   Due to the current state of emergency, patients may not be receiving their 3-hours of Medicare-mandated therapy.  See Team Conference Notes for weekly updates to the plan of care

## 2019-09-27 NOTE — Progress Notes (Signed)
Inpatient Rehabilitation-Admissions Coordinator   Was notified by pt's insurance company that after peer to peer conference with Dr. Sharon Seller, pt has now been approved for CIR. I have received medical clearance from Dr. Sharon Seller for admit to CIR today. Notified pt and her husband of the approval and bed offer and they have accepted. Reviewed insurance benefit letter and consent forms. All questions answered.   RN and [facility] team notified of plan for admit to CIR today.   Please call if questions.   Cheri Rous, OTR/L  Rehab Admissions Coordinator  [phone] 09/27/2019 10:48 AM

## 2019-09-27 NOTE — Progress Notes (Signed)
Raechel Ache, OT  Rehab Admission Coordinator  Physical Medicine and Rehabilitation  PMR Pre-admission      Addendum  Date of Service:  09/23/2019  3:14 PM      Related encounter: ED to Hosp-Admission (Current) from 09/20/2019 in [facility]        PMR Admission Coordinator Pre-Admission Assessment   Patient: Patricia Stanley is an 74 y.o., female MRN: [id number] DOB: 05/31/1945 Height:   Weight:                                                                                                                                                    Insurance Information HMO: yes    PPO:      PCP:      IPA:      80/20:      OTHER:  PRIMARY: [employer]      Policy#: [id number]      Subscriber: patient CM Name: Lattie Haw      Phone#: [phone]     Fax#: [phone] Pre-Cert#: [id number]      Employer:  Josem Kaufmann ([phone]) provided by Lattie Haw for admit to CIR. Admit date 09/27/19. Clinicals are due to [employer] weekly (p): [phone] [id number] (f): [phone] Benefits:  Phone #: online at [email or web address]     Name: transaction ID #[id number] Eff. Date: 05/06/19-05/04/20     Deduct: does not have for in-network providers      Out of Pocket Max: $3,900 ($430 met)      Life Max: NA  CIR: $295/day co-pay for days 1-6, $0/day co-pay for days 7-90      SNF: $0/day co-pay for days 1-20, $184/day co-pay for days 21-100; limited to 100 days/cal yr Outpatient: $10-$40/visit co-pay pending service; visits limited by medical necessity      Home Health: 100% coverage, 0% co-insurance, limited by medical necessity DME: 80% coverage, 20% co-insurance      Providers:  SECONDARY: None      Policy#:       Phone#:    Development worker, community:       Phone#:    The Engineer, [employer]" for patients in Inpatient Rehabilitation Facilities with attached "Privacy Act [place] Records" was provided and verbally reviewed with: Patient and Family   Emergency Contact  Information         Contact Information     Name Relation Home Work Alda Spouse [phone]   [phone]    Charlyne Petrin Daughter [phone]   [phone]       Current Medical History  Patient Admitting Diagnosis: Embolic infarcts, multifocal, anterior and posterior   History of Present Illness:   Patricia Stanley is a 74 year old female history of CVA December 2020 with residual left-sided weakness and received inpatient rehab services 04/25/2019 to 05/10/2019 and maintained  on aspirin, hyperlipidemia, chronic normocytic anemia, hypertension, depression, chronic diarrhea, carotid artery stenosis with CEA 04/2019.  Per chart review patient lives with spouse.  1 level home 6 steps to entry.  Ambulates with a rolling walker but needed some additional support over the past month due to failure to thrive.  Presented 09/20/2019 with increased left arm weakness and numbness as well as left leg weakness with associated dyspnea and unintentional weight loss.  Cranial CT scan negative for intracranial hemorrhage or territorial infarct.  Small focal hypodensity in the left cerebellum reflecting possible indeterminate lacunar infarction.  Patient did not receive TPA.  MRI showed 8 mm acute ischemic left basal ganglia infarction.  Associated petechial hemorrhage without frank hemorrhagic transformation.  Multiple scattered chronic microhemorrhages involving both cerebral and cerebellar hemispheres most pronounced about the deep gray nuclei.  MRI cervical spine degenerative disc osteophyte C4-5 with resultant moderate spinal stenosis.  CT angiogram of the chest showed no demonstration of pulmonary emboli.  CT angiogram head and neck right carotid enterectomy widely patent.  No significant stenosis in the left carotid bifurcation.  Negative for intracranial large vessel occlusion.  CT abdomen pelvis showed no hydronephrosis, moderate colonic stool burden otherwise unremarkable.   Echocardiogram with ejection fraction of 60% no wall motion abnormalities.  Noted admission chemistries hemoglobin 7.1 as well as hemoglobin 7.2 09/16/2019, WBC 10.6, chloride 113, BUN 22, urinalysis negative nitrite, troponin 33, SARS coronavirus negative, sedimentation rate 29.  Venous Doppler studies negative for DVT.  Maintained on aspirin 325 mg daily for CVA prophylaxis.  No DAPT due to anemia.  Her latest hemoglobin was 7.3.   Patient was transfused 1 unit of packed red blood cells.  Patient had recently been seen by Dr. Paulita Fujita for gastroenterology services for history of anemia did not feel any work-up was needed as she appeared to be stable hemoglobin over the last couple of years.  Patient not felt to be a candidate at this time for endoscopy or colonoscopy.  Therapy evaluations completed and patient is to be admitted for a comprehensive rehab program on 09/27/19.     Complete NIHSS TOTAL: 3 Glasgow Coma Scale Score: 15   Past Medical History      Past Medical History:  Diagnosis Date  . Closed fracture of coccyx ([place]) 2015    AFTER FALL  . Depression    . GERD (gastroesophageal reflux disease)    . Headache      MIGRAINES OCCASIONAL  . History of kidney stones      HAS NOW  . Hypertension        Family History  family history includes Hyperlipidemia in her father and mother; Hypertension in her father and mother.   Prior Rehab/Hospitalizations:  Has the patient had prior rehab or hospitalizations prior to admission? Yes   Has the patient had major surgery during 100 days prior to admission? No   Current Medications    Current Facility-Administered Medications:  .  acetaminophen (TYLENOL) tablet 650 mg, 650 mg, Oral, Q4H PRN, 650 mg at 09/21/19 0131 **OR** [DISCONTINUED] acetaminophen (TYLENOL) 160 MG/5ML solution 650 mg, 650 mg, Per Tube, Q4H PRN **OR** [DISCONTINUED] acetaminophen (TYLENOL) suppository 650 mg, 650 mg, Rectal, Q4H PRN, Doutova, Anastassia, MD .  aspirin  tablet 325 mg, 325 mg, Oral, Daily, Doutova, Anastassia, MD, 325 mg at 09/27/19 0955 .  atorvastatin (LIPITOR) tablet 40 mg, 40 mg, Oral, q1800, Rosalin Hawking, MD, 40 mg at 09/26/19 1848 .  bisacodyl (DULCOLAX) suppository 10 mg, 10 mg,  Rectal, Daily PRN, Cherene Altes, MD .  clonazePAM Bobbye [place]) tablet 0.5 mg, 0.5 mg, Oral, BID, Cherene Altes, MD, 0.5 mg at 09/27/19 1001 .  feeding supplement (ENSURE ENLIVE) (ENSURE ENLIVE) liquid 237 mL, 237 mL, Oral, TID BM, Doutova, Anastassia, MD, 237 mL at 09/26/19 2000 .  feeding supplement (PRO-STAT SUGAR FREE 64) liquid 30 mL, 30 mL, Oral, BID, Doutova, Anastassia, MD, 30 mL at 09/27/19 1000 .  ferrous sulfate tablet 325 mg, 325 mg, Oral, BID WC, Doutova, Anastassia, MD, 325 mg at 09/27/19 0958 .  folic acid (FOLVITE) tablet 1 mg, 1 mg, Oral, Daily, Cherene Altes, MD, 1 mg at 09/27/19 0958 .  HYDROcodone-acetaminophen (NORCO/VICODIN) 5-325 MG per tablet 1 tablet, 1 tablet, Oral, Q6H PRN, Cherene Altes, MD, 1 tablet at 09/26/19 2209 .  lactulose (CHRONULAC) 10 GM/15ML solution 20 g, 20 g, Oral, BID PRN, Cherene Altes, MD, 20 g at 09/27/19 0955 .  mirtazapine (REMERON) tablet 15 mg, 15 mg, Oral, QHS, Doutova, Anastassia, MD, 15 mg at 09/26/19 2115 .  multivitamin with minerals tablet 1 tablet, 1 tablet, Oral, Daily, Doutova, Anastassia, MD, 1 tablet at 09/27/19 1000 .  ondansetron (ZOFRAN) tablet 4 mg, 4 mg, Oral, TID AC, Cherene Altes, MD, 4 mg at 09/27/19 0955 .  pantoprazole (PROTONIX) EC tablet 40 mg, 40 mg, Oral, BID AC, Doutova, Anastassia, MD, 40 mg at 09/27/19 0958 .  senna-docusate (Senokot-S) tablet 1 tablet, 1 tablet, Oral, BID, Cherene Altes, MD, 1 tablet at 09/27/19 1001 .  traMADol (ULTRAM) tablet 50 mg, 50 mg, Oral, Q6H PRN, Doutova, Anastassia, MD, 50 mg at 09/27/19 1001   Patients Current Diet:     Diet Order                      Diet regular Room service appropriate? Yes; Fluid consistency: Thin  Diet  effective now                   Precautions / Restrictions Precautions Precautions: Fall Precaution Comments: L side weakness, global weakness Restrictions Weight Bearing Restrictions: No Other Position/Activity Restrictions: weak LUE grip    Has the patient had 2 or more falls or a fall with injury in the past year?Yes   Prior Activity Level Household: limited since last CVA (Dec/Jan); CGA for ambulation and up to Min A for ADLs. Used RW for ambulation. recently got wc due to recent onset of weakness.    Prior Functional Level Prior Function Level of Independence: Needs assistance Gait / Transfers Assistance Needed: ambulates with RW and has needed additional support last month ADL's / Homemaking Assistance Needed: since her CVA spouse has been providing some assist for ADL   Self Care: Did the patient need help bathing, dressing, using the toilet or eating?  Needed some help   Indoor Mobility: Did the patient need assistance with walking from room to room (with or without device)? Needed some help   Stairs: Did the patient need assistance with internal or external stairs (with or without device)? Needed some help   Functional Cognition: Did the patient need help planning regular tasks such as shopping or remembering to take medications? Needed some help   Home Assistive Devices / Equipment Home Equipment: Walker - 2 wheels, Bedside commode, Shower seat, Grab bars - tub/shower, Hand held shower head   Prior Device Use: Indicate devices/aids used by the patient prior to current illness, exacerbation or injury? Walker and has  recently obtained a wc   Current Functional Level Cognition   Arousal/Alertness: Awake/alert Overall Cognitive Status: History of cognitive impairments - at baseline Difficult to assess due to: Hard of hearing/deaf, Level of arousal Current Attention Level: Selective, Sustained Orientation Level: Oriented to person, Oriented to place, Disoriented to  situation, Disoriented to time Following Commands: Follows one step commands inconsistently, Follows one step commands with increased time General Comments: pt was seen for mobility with dense cues for managmeent of LUE and use of RUE to support standing Attention: Focused, Sustained Focused Attention: Impaired Focused Attention Impairment: Verbal complex Sustained Attention: Impaired Sustained Attention Impairment: Verbal complex Memory: Impaired Memory Impairment: Retrieval deficit, Storage deficit, Decreased recall of new information Awareness: Impaired Awareness Impairment: Intellectual impairment Problem Solving: Appears intact(For basic problem solving related to safety)    Extremity Assessment (includes Sensation/Coordination)   Upper Extremity Assessment: Defer to OT evaluation LUE Deficits / Details: grossly 3/5 shoulder and elbow strength, 1/5 wrist and significantly weak gross grasp  LUE Sensation: decreased light touch, decreased proprioception LUE Coordination: decreased fine motor, decreased gross motor  Lower Extremity Assessment: LLE deficits/detail, Generalized weakness LLE Deficits / Details: Grossly 2/5 for ankle PF; 3/5 at hip and knee flexors. Decreased sensation from knee down to foot.  LLE Sensation: decreased light touch LLE Coordination: decreased gross motor     ADLs   Overall ADL's : Needs assistance/impaired Eating/Feeding: Set up, Sitting Grooming: Minimal assistance, Sitting, Wash/dry face, Brushing hair Upper Body Bathing: Moderate assistance, Sitting Lower Body Bathing: Maximal assistance, +2 for physical assistance, Sitting/lateral leans, Sit to/from stand Upper Body Dressing : Moderate assistance, Sitting Lower Body Dressing: Maximal assistance, +2 for physical assistance, Sitting/lateral leans, Sit to/from stand Toilet Transfer: Moderate assistance, +2 for physical assistance, Stand-pivot, BSC, Cueing for sequencing Toilet Transfer Details  (indicate cue type and reason): MOD A +1 stand pivot EOB<>BSC Toileting- Clothing Manipulation and Hygiene: Total assistance, Sit to/from stand Toileting - Clothing Manipulation Details (indicate cue type and reason): total A for posterior pericare in standing Functional mobility during ADLs: Moderate assistance, +2 for physical assistance, Rolling walker, Cueing for safety, Cueing for sequencing General ADL Comments: pt continues to present with generalized weakness, decreased activity tolerance and decreased LUE FMC and strength impacting pts ability to engage in ADLs     Mobility   Overal bed mobility: Needs Assistance Bed Mobility: Supine to Sit Rolling: Mod assist Supine to sit: Mod assist, +2 for physical assistance, +2 for safety/equipment Sit to supine: Mod assist, +2 for physical assistance, +2 for safety/equipment General bed mobility comments: agreed to sit on side of bed and chair, with cues for postural control both sitting and standing     Transfers   Overall transfer level: Needs assistance Equipment used: Rolling walker (2 wheeled), 2 person hand held assist Transfers: Sit to/from Stand, Stand Pivot Transfers Sit to Stand: Mod assist, +2 safety/equipment, +2 physical assistance, From elevated surface Stand pivot transfers: Mod assist, +2 physical assistance, +2 safety/equipment, From elevated surface General transfer comment: MOD A for stand pivot transfer from EOB<>BSC with hand held assist. Pt completed second trial with MOD A +2 with RW needing assist manage LUE on RW     Ambulation / Gait / Stairs / Wheelchair Mobility   Ambulation/Gait Ambulation/Gait assistance: Mod assist, +2 physical assistance, +2 safety/equipment Gait Distance (Feet): 5 Feet Assistive device: Rolling walker (2 wheeled) Gait Pattern/deviations: Step-to pattern, Decreased stride length, Wide base of support, Trunk flexed, Decreased weight shift to left General Gait  Details: standing and mobility in  standing with mod assist and dense cues for controlling buckling on LLE Gait velocity: Decreased Gait velocity interpretation: <1.8 ft/sec, indicate of risk for recurrent falls     Posture / Balance Dynamic Sitting Balance Sitting balance - Comments: 2 person help for control of balance and functional mobility Balance Overall balance assessment: Needs assistance Sitting-balance support: Feet supported, Bilateral upper extremity supported Sitting balance-Leahy Scale: Fair Sitting balance - Comments: 2 person help for control of balance and functional mobility Standing balance support: Bilateral upper extremity supported, During functional activity Standing balance-Leahy Scale: Poor Standing balance comment: reliant on external assist     Special needs/care consideration Continuous Drip IV: none Oxygen: 2L [place] History of bowel incontinence Designated visitor: husband [place] (from acute therapy documentation) Living Arrangements: Spouse/significant other, Children  Lives With: Spouse, Daughter Available Help at Discharge: Family, Available 24 hours/day Type of Home: [place]: One level Home Access: Stairs to enter Entrance Stairs-Rails: Left, Right Entrance Stairs-Number of Steps: 6 Bathroom Shower/Tub: Multimedia programmer: Standard   Discharge Living Setting Plans for Discharge Living Setting: Patient's home, Lives with (comment)(lives with husband and grown daugther ) Type of Home at Discharge: House Discharge Home Layout: One level Discharge Home Access: Stairs to enter Entrance Stairs-Rails: Right, Left Entrance Stairs-Number of Steps: 6 (in back); 4 in front (mostly uses back steps though) Discharge Bathroom Shower/Tub: Tub/shower unit(with tub transfer bench) Discharge Bathroom Toilet: Handicapped height Discharge Bathroom Accessibility: Yes How Accessible: Accessible via walker Does the patient have any problems  obtaining your medications?: No   Social/Family/Support Systems Patient Roles: Spouse Contact Information: [name] (husband): cell [phone]; home: [phone] Anticipated Caregiver: husband and daughter Anticipated Caregiver's Contact Information: see above Ability/Limitations of Caregiver: Min A Caregiver Availability: 24/7 Discharge Plan Discussed with Primary Caregiver: Yes(pt and her husband) Is Caregiver In Agreement with Plan?: Yes Does Caregiver/Family have Issues with Lodging/Transportation while Pt is in Rehab?: No     Goals Patient/Family Goal for Rehab: PT/OT/SLP: Min A Expected length of stay: 10-14 days Cultural Considerations: NA Pt/Family Agrees to Admission and willing to participate: Yes Program Orientation Provided & Reviewed with Pt/Caregiver Including Roles  & Responsibilities: Yes(pt and her husband)  Barriers to Discharge: Home environment access/layout, Incontinence, New oxygen  Barriers to Discharge Comments: steps to enter home; new O2 on this admission     Decrease burden of Care through IP rehab admission: NA     Possible need for SNF placement upon discharge: Not anticipated; pt has great family support and has gone through [facility] program prior with good outcomes.      Patient Condition: This patient's medical and functional status has changed since the consult dated: 09/23/19 in which the Rehabilitation Physician determined and documented that the patient's condition is appropriate for intensive rehabilitative care in an inpatient rehabilitation facility. See "History of Present Illness" (above) for medical update. Functional changes are: improved ambulation distance from Mod A +2 for 4 feet to Mod A +2 5 feet . Patient's medical and functional status update has been discussed with the Rehabilitation physician and patient remains appropriate for inpatient rehabilitation. Will admit to inpatient rehab today, 09/27/19.   Preadmission Screen Completed By:   Raechel Ache, OT, 09/27/2019 11:08 AM ______________________________________________________________________   Discussed status with Dr. Dagoberto Ligas on 09/27/19 at 11:00AM and received approval for admission today.   Admission Coordinator:  Raechel Ache, time 11:00AM/Date 09/27/19.  Cosigned by: Courtney Heys, MD at 09/27/2019 12:11 PM  Revision History

## 2019-09-27 NOTE — H&P (Signed)
Physical Medicine and Rehabilitation Admission H&P    No chief complaint on file. : HPI: Patricia Stanley is a 74 year old right-handed female history of CVA December 2020 with residual left-sided weakness and received inpatient rehab services 04/25/2019 to 05/10/2019 and maintained on aspirin, hyperlipidemia, chronic normocytic anemia, hypertension, depression, chronic diarrhea, carotid artery stenosis with CEA 04/2019.  Per chart review patient lives with spouse.  1 level home 6 steps to entry.  Ambulates with a rolling walker but needed some additional support over the past month due to failure to thrive.  Presented 09/20/2019 with increased left arm weakness and numbness as well as left leg weakness with associated dyspnea and unintentional weight loss.  Cranial CT scan negative for intracranial hemorrhage or territorial infarct.  Small focal hypodensity in the left cerebellum reflecting possible indeterminate lacunar infarction.  Patient did not receive TPA.  MRI showed 8 mm acute ischemic left basal ganglia infarction.  Associated petechial hemorrhage without frank hemorrhagic transformation.  Multiple scattered chronic microhemorrhages involving both cerebral and cerebellar hemispheres most pronounced about the deep gray nuclei.  MRI cervical spine degenerative disc osteophyte C4-5 with resultant moderate spinal stenosis.  CT angiogram of the chest showed no demonstration of pulmonary emboli.  CT angiogram head and neck right carotid enterectomy widely patent.  No significant stenosis in the left carotid bifurcation.  Negative for intracranial large vessel occlusion.  CT abdomen pelvis showed no hydronephrosis, moderate colonic stool burden otherwise unremarkable.  Echocardiogram with ejection fraction of 60% no wall motion abnormalities.  Noted admission chemistries hemoglobin 7.1 as well as hemoglobin 7.2 09/16/2019, WBC 10.6, chloride 113, BUN 22, urinalysis negative nitrite, troponin 33, SARS  coronavirus negative, sedimentation rate 29.  Venous Doppler studies negative for DVT.  Maintained on aspirin 325 mg daily for CVA prophylaxis.  No DAPT due to anemia.  Her latest hemoglobin was 7.3.   Patient was transfused 1 unit of packed red blood cells.  Patient had recently been seen by Dr. Dulce Sellar for gastroenterology services for history of anemia did not feel any work-up was needed as she appeared to be stable hemoglobin over the last couple of years.  Patient not felt to be a candidate at this time for endoscopy or colonoscopy.  Therapy evaluations completed and patient was admitted for a comprehensive rehab program.  Husband said recently found out Hydralazine??? Was causing her to have chronic diarrhea and joint inflammation and was stopped- and put on Creon instead??? Now having constipation and only had 3 small BMs (last 1 this AM 1 small hard ball) in 2 weeks.  On O2 by [place] 1L currently due to "lung issues from hydralazine" - said it caused a lupus like reaction, per husband.     Review of Systems  Constitutional: Positive for malaise/fatigue and weight loss. Negative for chills and fever.  Eyes: Negative for blurred vision and double vision.  Respiratory: Negative for cough and shortness of breath.   Cardiovascular: Positive for leg swelling. Negative for chest pain and palpitations.  Gastrointestinal: Positive for constipation. Negative for heartburn, nausea and vomiting.       Chronic diarrhea GERD  Genitourinary: Negative for dysuria, flank pain and hematuria.  Musculoskeletal: Positive for joint pain.       History of falls  Skin: Negative for rash.  Neurological: Positive for dizziness, weakness and headaches.  Psychiatric/Behavioral: Positive for depression. The patient has insomnia.   All other systems reviewed and are negative.  Past Medical History:  Diagnosis Date  .  Closed fracture of coccyx (HCC) 2015   AFTER FALL  . Depression   . GERD (gastroesophageal reflux  disease)   . Headache    MIGRAINES OCCASIONAL  . History of kidney stones    HAS NOW  . Hypertension    Past Surgical History:  Procedure Laterality Date  . ABDOMINAL HYSTERECTOMY     1 OVARY LEFT  . APPENDECTOMY  AGE 76   DONE WITH OVARY REMOVAL  . COLONOSCOPY WITH PROPOFOL N/A 12/03/2015   Procedure: COLONOSCOPY WITH PROPOFOL;  Surgeon: Charolett Bumpers, MD;  Location: [facility]COPY;  Service: Endoscopy;  Laterality: N/A;  . ENDARTERECTOMY Right 04/21/2019   Procedure: ENDARTERECTOMY CAROTID;  Surgeon: Cephus Shelling, MD;  Location: [facility] OR;  Service: Vascular;  Laterality: Right;  . ESOPHAGOGASTRODUODENOSCOPY (EGD) WITH PROPOFOL N/A 12/03/2015   Procedure: ESOPHAGOGASTRODUODENOSCOPY (EGD) WITH PROPOFOL;  Surgeon: Charolett Bumpers, MD;  Location: [facility] ENDOSCOPY;  Service: Endoscopy;  Laterality: N/A;  . TONSILLECTOMY  AGE 31   AND ADENOIDS REMOVED   Family History  Problem Relation Age of Onset  . Hypertension Mother   . Hyperlipidemia Mother   . Hypertension Father   . Hyperlipidemia Father    Social History:  reports that she quit smoking about 24 years ago. She has never used smokeless tobacco. She reports that she does not drink alcohol or use drugs. Allergies:  Allergies  Allergen Reactions  . Hydralazine Hcl     Lupus type reaction - DO NOT USE   . Antihistamines, Chlorpheniramine-Type Other (See Comments)    Makes her heart race  . Paroxetine Other (See Comments)    Shakes-tremors   Medications Prior to Admission  Medication Sig Dispense Refill  . aspirin 325 MG tablet Take 1 tablet (325 mg total) by mouth daily. 30 tablet 0  . atenolol (TENORMIN) 50 MG tablet Take 1 tablet (50 mg total) by mouth every evening. 30 tablet 0  . atorvastatin (LIPITOR) 80 MG tablet Take 1 tablet (80 mg total) by mouth daily at 6 PM. 30 tablet 0  . cholestyramine (QUESTRAN) 4 g packet Take 1 packet (4 g total) by mouth 2 (two) times daily. 180 packet 0  . clonazePAM (KLONOPIN) 1 MG  tablet 1 tab every a.m. and 2 tabs nightly (Patient taking differently: Take 1 mg by mouth in the morning and at bedtime. ) 60 tablet 0  . ferrous sulfate 325 (65 FE) MG tablet Take 1 tablet (325 mg total) by mouth 2 (two) times daily with a meal. 180 tablet 0  . lipase/protease/amylase (CREON) 36000 UNITS CPEP capsule Take 1 capsule (36,000 Units total) by mouth 3 (three) times daily with meals. 270 capsule 0  . lisinopril (ZESTRIL) 10 MG tablet Take 1 tablet (10 mg total) by mouth daily. 30 tablet 0  . loperamide (IMODIUM) 2 MG capsule Take 1 capsule (2 mg total) by mouth as needed for diarrhea or loose stools. 30 capsule 0  . mirtazapine (REMERON) 15 MG tablet Take 1 tablet (15 mg total) by mouth at bedtime. 90 tablet 0  . ondansetron (ZOFRAN) 4 MG tablet Take 1 tablet (4 mg total) by mouth 3 (three) times daily before meals. 90 tablet 2  . pantoprazole (PROTONIX) 40 MG tablet Take 1 tablet (40 mg total) by mouth 2 (two) times daily before a meal. 60 tablet 0    Drug Regimen Review Drug regimen was reviewed and remains appropriate with no significant issues identified  Home: Home Living Family/patient expects to be discharged  to:: Private residence Living Arrangements: Spouse/significant other, Children   Functional History:    Functional Status:  Mobility:          ADL:    Cognition: Cognition Orientation Level: Oriented to person, Oriented to place, Oriented to time, Oriented to situation    Physical Exam: Blood pressure (!) 178/87, pulse 86, temperature 98.2 F (36.8 C), temperature source Oral, resp. rate 19, height 5\' 1"  (1.549 m), weight 41.8 kg, SpO2 99 %. Physical Exam  Nursing note and vitals reviewed. Constitutional: She appears well-developed.  Frail older appearing female lying in bed with O2 by [place], husband at bedside- did most of talking, NAD  HENT:  Head: Normocephalic and atraumatic.  Nose: Nose normal.  Mouth/Throat: No oropharyngeal exudate.  Tongue  furrowed and midline- dry Smile equal  Eyes: Conjunctivae are normal. Right eye exhibits no discharge. Left eye exhibits no discharge. No scleral icterus.  EOMI B/L- no nystagmus  Neck: No tracheal deviation present.  Cardiovascular:  RRR- no M/R/G  Respiratory: No stridor.  CTA B/L- no W/R/R- good air movement- no crackles either  GI:  Soft, NT; slightly distended- hyperactive BS- has significant hemorrhoids on exam- external noted  Genitourinary:    Genitourinary Comments: purewick in place with depends   Musculoskeletal:     Cervical back: Normal range of motion and neck supple.     Comments: RUE- biceps 4/5, triceps 4/5, WE 4-/5, grip 4-/5, finger abd 3/5 LUE- biceps 2-/5, triceps 3-/5, WE 1/5, grip 2/5, finger abd 0/5 RLE- 4+/5 in HF, KE, KF , DF and PF LLE- HF 4-/5, KE/KF 4/5, DF 3-/5, PF 4/5  Neurological:  Patient is a bit lethargic but arousable.  Cachectic.  Speech is a bit dysarthric but intelligible.  Provides her name and place.  Follows simple commands.  Limited medical historian.  Lethargic, sleepy- woke to verbal stimuli, however kept falling back asleep No hoffman's no clonus Sensation equal between sides- intact to light touch  Skin:  Mild rash- mild erythema rash- not papular- looks like heat rash under/around bra line No skin breakdown on backside or heels- checked with husband IV L forearm no infiltrate  Psychiatric:  Lethargic, slowed processing    Results for orders placed or performed during the hospital encounter of 09/20/19 (from the past 48 hour(s))  CBC     Status: Abnormal   Collection Time: 09/27/19  5:47 AM  Result Value Ref Range   WBC 14.3 (H) 4.0 - 10.5 K/uL   RBC 2.74 (L) 3.87 - 5.11 MIL/uL   Hemoglobin 7.8 (L) 12.0 - 15.0 g/dL   HCT 09/29/19 (L) 71.0 - 62.6 %   MCV 90.1 80.0 - 100.0 fL   MCH 28.5 26.0 - 34.0 pg   MCHC 31.6 30.0 - 36.0 g/dL   RDW 94.8 (H) 54.6 - 27.0 %   Platelets 219 150 - 400 K/uL   nRBC 0.0 0.0 - 0.2 %    Comment:  Performed at [facility], [address], [place], [place]  Comprehensive metabolic panel     Status: Abnormal   Collection Time: 09/27/19  5:47 AM  Result Value Ref Range   Sodium 142 135 - 145 mmol/L   Potassium 4.3 3.5 - 5.1 mmol/L   Chloride 104 98 - 111 mmol/L   CO2 28 22 - 32 mmol/L   Glucose, Bld 112 (H) 70 - 99 mg/dL    Comment: Glucose reference range applies only to samples taken after fasting for  at least 8 hours.   BUN 30 (H) 8 - 23 mg/dL   Creatinine, Ser 1.32 (H) 0.44 - 1.00 mg/dL   Calcium 8.6 (L) 8.9 - 10.3 mg/dL   Total Protein 5.4 (L) 6.5 - 8.1 g/dL   Albumin 1.5 (L) 3.5 - 5.0 g/dL   AST 41 15 - 41 U/L   ALT 25 0 - 44 U/L   Alkaline Phosphatase 153 (H) 38 - 126 U/L   Total Bilirubin 0.9 0.3 - 1.2 mg/dL   GFR calc non Af Amer 40 (L) >60 mL/min   GFR calc Af Amer 46 (L) >60 mL/min   Anion gap 10 5 - 15    Comment: Performed at [facility] [address] [place], [place] [address]  Glucose, capillary     Status: Abnormal   Collection Time: 09/27/19 11:41 AM  Result Value Ref Range   Glucose-Capillary 123 (H) 70 - 99 mg/dL    Comment: Glucose reference range applies only to samples taken after fasting for at least 8 hours.   DG Chest 2 View  Result Date: 09/27/2019 CLINICAL DATA:  Acute respiratory failure EXAM: CHEST - 2 VIEW COMPARISON:  09/21/2019 FINDINGS: Cardiac shadow is stable. Aortic calcifications are again seen. Patchy opacities are again identified bilaterally stable from the prior exam. No new focal abnormality is seen. No bony abnormality is noted. IMPRESSION: Stable patchy opacities bilaterally Electronically Signed   By: Inez Catalina M.D.   On: 09/27/2019 08:43       Medical Problem List and Plan: 1.  Left side weakness with decreased functional mobility secondary to multifocal punctate anterior and posterior infarcts as well as history of CVA December 2020 with residual left-sided weakness  -patient may   shower  -ELOS/Goals: 2-2.5 weeks- goals supervision to [place] assist 2.  Antithrombotics: -DVT/anticoagulation: SCDs.  -antiplatelet therapy: Aspirin 325 mg daily 3. Pain Management: Tramadol as needed 4. Mood: Klonopin 0.5 mg twice daily, Remeron 50 mg nightly  -antipsychotic agents: N/A 5. Neuropsych: This patient is capable of making decisions on her own behalf. 6. Skin/Wound Care: Routine skin checks 7. Fluids/Electrolytes/Nutrition: Routine in and outs with follow-up chemistries 8.  Chronic normocytic anemia.  Transfuse 1 unit packed red blood cells.  Continue iron supplement.  Followed outpatient by GI services.  Not a candidate for colonoscopy or endoscopy 9.  Chronic diarrhea- now constipated- per husband was due to hydralazine- is off it- now on Creon.  Followed outpatient by GI services. -will start Miralax daily for pt- also has lactulose prn- might need Mg citrate to clean her out and suggest adding Colace since she has "hard balls of stool".   10.  Unintentional weight loss.  CT abdomen pelvis unremarkable.  Dietary follow-up- likely due to chronic diarrhea- pt said every time she ate, she pooped diarrhea- so stopped eating.  11.  Hyperlipidemia.  Lipitor 12.  GERD.  Protonix   Lavon Paganini. Anguiili, PA-C 09/27/2019  I have personally performed a face to face diagnostic evaluation of this patient and formulated the key components of the plan.  Additionally, I have personally reviewed laboratory data, imaging studies, as well as relevant notes and concur with the physician assistant's documentation above.   The patient's status has not changed from the original H&P.  Any changes in documentation from the acute care chart have been noted above.     Courtney Heys, MD 09/27/2019

## 2019-09-27 NOTE — Progress Notes (Signed)
Nutrition Follow-up  DOCUMENTATION CODES:   Non-severe (moderate) malnutrition in context of chronic illness  INTERVENTION:  -Continue Ensure Enlive po TID, each supplement provides 350 kcal and 20 grams of protein -Continue MVI daily -Continue Magic cup TID with meals, each supplement provides 290 kcal and 9 grams of protein -Continue 55ml Pro-stat BID, each supplement provides 100 kcal and 15 grams of protein   NUTRITION DIAGNOSIS:   Moderate Malnutrition related to chronic illness(CVA) as evidenced by energy intake < or equal to 75% for > or equal to 1 month, mild muscle depletion, mild fat depletion, moderate fat depletion, moderate muscle depletion.  Ongoing.  GOAL:   Patient will meet greater than or equal to 90% of their needs  Progressing.  MONITOR:   PO intake, Supplement acceptance, Weight trends, Labs, I & O's  REASON FOR ASSESSMENT:   Consult Assessment of nutrition requirement/status  ASSESSMENT:   Pt with a PMH significant for CVA (04/2019), HLD, HTN, carotid artery stenosis s/p R CEA, FTT, anemia, and recent hospitalization for weakness and diarrhea. Pt now presents with worsening of her L-sided weakness.  Pt unavailable at time of RD visit.   Discussed pt with RN. Per RN, pt doing well with Pro-stat (receiving 36ml po BID) and fairly well with Ensure (receiving TID). RN unsure of how pt is doing with [employer] (also receiving TID).   Pt pending CIR.   PO Intake: 25% x 1 recorded meals  UOP: x24 hours I/O: -1,988.49ml since admit  Labs reviewed. Medications reviewed and include: ferrous sulfate, folvite, remeron, MVI, zofran, senokot-S   Diet Order:   Diet Order            Diet regular Room service appropriate? Yes; Fluid consistency: Thin  Diet effective now              EDUCATION NEEDS:   No education needs have been identified at this time  Skin:  Skin Assessment: Reviewed RN Assessment  Last BM:  5/24 type 1  Height:    Ht Readings from Last 1 Encounters:  09/11/19 5\' 1"  (1.549 m)    Weight:   Wt Readings from Last 1 Encounters:  09/16/19 48.8 kg    BMI:  There is no height or weight on file to calculate BMI.  Estimated Nutritional Needs:   Kcal:  1400-1600  Protein:  70-85 grams  Fluid:  >1.4L/d    09/18/19, MS, RD, LDN RD pager number and weekend/on-call pager number located in Amion.

## 2019-09-27 NOTE — Progress Notes (Signed)
PAtient arrived to unit with spouse. Recent stay in rehab so family verbalized understanding of unit routines. Made comfortable in the room, resting with call bell in reach

## 2019-09-28 ENCOUNTER — Inpatient Hospital Stay (HOSPITAL_COMMUNITY): Payer: Medicare HMO

## 2019-09-28 ENCOUNTER — Inpatient Hospital Stay (HOSPITAL_COMMUNITY): Payer: Medicare HMO | Admitting: Occupational Therapy

## 2019-09-28 DIAGNOSIS — M4802 Spinal stenosis, cervical region: Secondary | ICD-10-CM

## 2019-09-28 DIAGNOSIS — D638 Anemia in other chronic diseases classified elsewhere: Secondary | ICD-10-CM

## 2019-09-28 DIAGNOSIS — N179 Acute kidney failure, unspecified: Secondary | ICD-10-CM

## 2019-09-28 DIAGNOSIS — R7303 Prediabetes: Secondary | ICD-10-CM

## 2019-09-28 DIAGNOSIS — E46 Unspecified protein-calorie malnutrition: Secondary | ICD-10-CM

## 2019-09-28 DIAGNOSIS — N189 Chronic kidney disease, unspecified: Secondary | ICD-10-CM

## 2019-09-28 DIAGNOSIS — D72829 Elevated white blood cell count, unspecified: Secondary | ICD-10-CM

## 2019-09-28 DIAGNOSIS — I6302 Cerebral infarction due to thrombosis of basilar artery: Secondary | ICD-10-CM

## 2019-09-28 DIAGNOSIS — Z9981 Dependence on supplemental oxygen: Secondary | ICD-10-CM

## 2019-09-28 DIAGNOSIS — K529 Noninfective gastroenteritis and colitis, unspecified: Secondary | ICD-10-CM

## 2019-09-28 DIAGNOSIS — R778 Other specified abnormalities of plasma proteins: Secondary | ICD-10-CM

## 2019-09-28 DIAGNOSIS — R7401 Elevation of levels of liver transaminase levels: Secondary | ICD-10-CM

## 2019-09-28 DIAGNOSIS — I10 Essential (primary) hypertension: Secondary | ICD-10-CM

## 2019-09-28 DIAGNOSIS — E8809 Other disorders of plasma-protein metabolism, not elsewhere classified: Secondary | ICD-10-CM

## 2019-09-28 LAB — COMPREHENSIVE METABOLIC PANEL
ALT: 30 U/L (ref 0–44)
AST: 51 U/L — ABNORMAL HIGH (ref 15–41)
Albumin: 1.5 g/dL — ABNORMAL LOW (ref 3.5–5.0)
Alkaline Phosphatase: 172 U/L — ABNORMAL HIGH (ref 38–126)
Anion gap: 8 (ref 5–15)
BUN: 33 mg/dL — ABNORMAL HIGH (ref 8–23)
CO2: 27 mmol/L (ref 22–32)
Calcium: 8.5 mg/dL — ABNORMAL LOW (ref 8.9–10.3)
Chloride: 102 mmol/L (ref 98–111)
Creatinine, Ser: 1.35 mg/dL — ABNORMAL HIGH (ref 0.44–1.00)
GFR calc Af Amer: 45 mL/min — ABNORMAL LOW (ref >60)
GFR calc non Af Amer: 39 mL/min — ABNORMAL LOW (ref >60)
Glucose, Bld: 112 mg/dL — ABNORMAL HIGH (ref 70–99)
Potassium: 4 mmol/L (ref 3.5–5.1)
Sodium: 137 mmol/L (ref 135–145)
Total Bilirubin: 0.5 mg/dL (ref 0.3–1.2)
Total Protein: 5.3 g/dL — ABNORMAL LOW (ref 6.5–8.1)

## 2019-09-28 LAB — CBC WITH DIFFERENTIAL/PLATELET
Abs Immature Granulocytes: 0.1 10*3/uL — ABNORMAL HIGH (ref 0.00–0.07)
Basophils Absolute: 0.1 10*3/uL (ref 0.0–0.1)
Basophils Relative: 1 %
Eosinophils Absolute: 0.6 10*3/uL — ABNORMAL HIGH (ref 0.0–0.5)
Eosinophils Relative: 4 %
HCT: 23.5 % — ABNORMAL LOW (ref 36.0–46.0)
Hemoglobin: 7.5 g/dL — ABNORMAL LOW (ref 12.0–15.0)
Immature Granulocytes: 1 %
Lymphocytes Relative: 10 %
Lymphs Abs: 1.4 10*3/uL (ref 0.7–4.0)
MCH: 28.8 pg (ref 26.0–34.0)
MCHC: 31.9 g/dL (ref 30.0–36.0)
MCV: 90.4 fL (ref 80.0–100.0)
Monocytes Absolute: 0.9 10*3/uL (ref 0.1–1.0)
Monocytes Relative: 6 %
Neutro Abs: 10.3 10*3/uL — ABNORMAL HIGH (ref 1.7–7.7)
Neutrophils Relative %: 78 %
Platelets: 312 10*3/uL (ref 150–400)
RBC: 2.6 MIL/uL — ABNORMAL LOW (ref 3.87–5.11)
RDW: 20.7 % — ABNORMAL HIGH (ref 11.5–15.5)
WBC: 13.3 10*3/uL — ABNORMAL HIGH (ref 4.0–10.5)
nRBC: 0 % (ref 0.0–0.2)

## 2019-09-28 LAB — ANTI-SCLERODERMA ANTIBODY: Scleroderma (Scl-70) (ENA) Antibody, IgG: <0.2 AI (ref 0.0–0.9)

## 2019-09-28 MED ORDER — HYDROCODONE-ACETAMINOPHEN 5-325 MG PO TABS
1.0000 | ORAL_TABLET | Freq: Once | ORAL | Status: AC
  Administered 2019-09-28: 1 via ORAL
  Filled 2019-09-28: qty 1

## 2019-09-28 MED ORDER — PRO-STAT SUGAR FREE PO LIQD
60.0000 mL | Freq: Two times a day (BID) | ORAL | Status: DC
  Administered 2019-09-28 – 2019-10-05 (×14): 60 mL via ORAL
  Filled 2019-09-28 (×14): qty 60

## 2019-09-28 NOTE — Evaluation (Signed)
Speech Language Pathology Assessment and Plan  Patient Details  Name: Patricia Stanley MRN: [id number] Date of Birth: 1945/11/06  SLP Diagnosis: Cognitive Impairments  Rehab Potential: Good ELOS: 2-2.5 weeks    Today's Date: 09/28/2019 SLP Individual Time: 7829-5621 SLP Individual Time Calculation (min): 59 min   Problem List:  Patient Active Problem List   Diagnosis Date Noted  . Spinal stenosis in cervical region   . Anemia of chronic disease   . Chronic diarrhea   . Supplemental oxygen dependent   . Hypoalbuminemia due to protein-calorie malnutrition ([place])   . Prediabetes   . Leukocytosis   . AKI (acute kidney injury) ([place])   . Transaminitis   . Obstipation 09/21/2019  . CVA (cerebral vascular accident) ([place]) 09/21/2019  . Symptomatic anemia 09/21/2019  . Generalized body aches 09/21/2019  . Bronchiectasis ([place]) 09/21/2019  . Palliative care by specialist   . Goals of care, counseling/discussion   . DNR (do not resuscitate)   . Anemia 09/12/2019  . Physical deconditioning 09/12/2019  . Malnutrition of moderate degree 09/12/2019  . Diarrhea 09/11/2019  . Moderate episode of recurrent major depressive disorder ([place])   . Small vessel cerebrovascular accident (CVA) ([place]) 04/25/2019  . Acute CVA (cerebrovascular accident) ([place]) 04/18/2019  . Weight loss, unintentional 04/18/2019  . Anxiety 04/18/2019  . Major depression in partial remission ([place]) 08/25/2012  . Osteoporosis, unspecified 08/25/2012  . HTN (hypertension) 08/25/2012  . Fall at home 08/24/2012  . Pelvic fracture ([place]) 08/24/2012  . Migraines 08/24/2012  . Renal insufficiency, mild 08/24/2012   Past Medical History:  Past Medical History:  Diagnosis Date  . Closed fracture of coccyx ([place]) 2015   AFTER FALL  . Depression   . GERD (gastroesophageal reflux disease)   . Headache    MIGRAINES OCCASIONAL  . History of kidney stones    HAS NOW  . Hypertension    Past Surgical History:  Past Surgical  History:  Procedure Laterality Date  . ABDOMINAL HYSTERECTOMY     1 OVARY LEFT  . APPENDECTOMY  AGE 70   DONE WITH OVARY REMOVAL  . COLONOSCOPY WITH PROPOFOL N/A 12/03/2015   Procedure: COLONOSCOPY WITH PROPOFOL;  Surgeon: Garlan Fair, MD;  Location: [facility];  Service: Endoscopy;  Laterality: N/A;  . ENDARTERECTOMY Right 04/21/2019   Procedure: ENDARTERECTOMY CAROTID;  Surgeon: Marty Heck, MD;  Location: [place] [facility];  Service: Vascular;  Laterality: Right;  . ESOPHAGOGASTRODUODENOSCOPY (EGD) WITH PROPOFOL N/A 12/03/2015   Procedure: ESOPHAGOGASTRODUODENOSCOPY (EGD) WITH PROPOFOL;  Surgeon: Garlan Fair, MD;  Location: [facility];  Service: Endoscopy;  Laterality: N/A;  . TONSILLECTOMY  AGE 42   AND ADENOIDS REMOVED    Assessment / Plan / Recommendation Clinical Impression Patricia Stanley is a 74 year old right-handed female history of CVA December 2020 with residual left-sided weakness and received inpatient rehab services 04/25/2019 to 05/10/2019 and maintained on aspirin, hyperlipidemia, chronic normocytic anemia, hypertension, depression, chronic diarrhea, carotid artery stenosis with CEA 04/2019.  Per chart review patient lives with spouse.  1 level home 6 steps to entry.  Ambulates with a rolling walker but needed some additional support over the past month due to failure to thrive.  Presented 09/20/2019 with increased left arm weakness and numbness as well as left leg weakness with associated dyspnea and unintentional weight loss.  Cranial CT scan negative for intracranial hemorrhage or territorial infarct.  Small focal hypodensity in the left cerebellum reflecting possible indeterminate lacunar infarction.  Patient did not receive TPA.  MRI showed 8 mm acute ischemic left basal ganglia infarction.  Associated petechial hemorrhage without frank hemorrhagic transformation.  Multiple scattered chronic microhemorrhages involving both cerebral and cerebellar hemispheres most  pronounced about the deep gray nuclei.  MRI cervical spine degenerative disc osteophyte C4-5 with resultant moderate spinal stenosis.  CT angiogram of the chest showed no demonstration of pulmonary emboli.  CT angiogram head and neck right carotid enterectomy widely patent.  No significant stenosis in the left carotid bifurcation.  Negative for intracranial large vessel occlusion.  CT abdomen pelvis showed no hydronephrosis, moderate colonic stool burden otherwise unremarkable.  Echocardiogram with ejection fraction of 60% no wall motion abnormalities.  Noted admission chemistries hemoglobin 7.1 as well as hemoglobin 7.2 09/16/2019, WBC 10.6, chloride 113, BUN 22, urinalysis negative nitrite, troponin 33, SARS coronavirus negative, sedimentation rate 29.  Venous Doppler studies negative for DVT.  Maintained on aspirin 325 mg daily for CVA prophylaxis.  No DAPT due to anemia.  Her latest hemoglobin was 7.3.   Patient was transfused 1 unit of packed red blood cells.  Patient had recently been seen by Dr. Paulita Fujita for gastroenterology services for history of anemia did not feel any work-up was needed as she appeared to be stable hemoglobin over the last couple of years.  Patient not felt to be a candidate at this time for endoscopy or colonoscopy.  Therapy evaluations completed and patient was admitted for a comprehensive rehab program.  Pt demonstrated moderate to severe cognitive linguistic impairments, deficits include sustained attention, problem solving, emergent awareness and recall. Pt and pt's husband, Patricia Stanley, support cognitive baseline function, however upon comparison from previous admission for CVA in January 2021, SLP noted cognitive decline following cognitive linguistic assessment. Cognitive linguistic subsections of Cognistat and MOCA version 7.1 were administered, noting severe deficits in recall, moderate deficits sustained attention/basic problem solving and mild deficits in following multistep  commands. SLP suggest primary deficits in memory impairment and reduce hearing acuity. Patricia Stanley and pt's daughter complete all IADLs and assisted with ADLs, per report from pt and Patricia Stanley. SLP believes pt to be near new cognitive baseline, but pt would benefit from skilled ST services in order to maximize carryover of OT/PT therapeutic instruction and reduce burden of care, requiring 24 hour supervision.    Skilled Therapeutic Interventions          Skilled ST services focused cognitive skill. SLP facilitated cognitive linguistic assessment given subsections of Cognistat and MOCA version 7.1. SLP educated pt and husband of assessed results and goals for treatment. All questions were answered to satisfaction. Pt was left in room with husband, call bell within reach and chair alarm set. SLP recommends to continue ST services.   SLP Assessment  Patient will need skilled [facility] during CIR admission    Recommendations  Patient destination: Home Follow up Recommendations: Other (comment)(TBD) Equipment Recommended: None recommended by SLP    SLP Frequency 1 to 3 out of 7 days   SLP Duration  SLP Intensity  SLP Treatment/Interventions 2-2.5 weeks  Minumum of 1-2 x/day, 30 to 90 minutes  Cognitive remediation/compensation;Cueing hierarchy;Functional tasks;Patient/family education;Internal/external aids    Pain Pain Assessment Pain Scale: 0-10 Pain Score: 0-No pain Faces Pain Scale: Hurts little more Pain Type: Acute pain Pain Location: Arm Pain Orientation: Right;Left Pain Descriptors / Indicators: Aching;Burning;Discomfort Pain Onset: On-going Pain Intervention(s): Repositioned  Prior Functioning Cognitive/Linguistic Baseline: Baseline deficits Baseline deficit details: Pt's husband reported that he has been managing medication since CVA in Dec. deficits in  memory, mildly complex problem solving, attention and awareness at DC in decemember Type of Home: House   Lives With: Spouse;Daughter Available Help at Discharge: Family;Available 24 hours/day Vocation: Retired  Programmer, systems Overall Cognitive Status: History of cognitive impairments - at baseline Arousal/Alertness: Awake/alert Orientation Level: Oriented X4 Attention: Sustained;Focused Focused Attention: Appears intact Focused Attention Impairment: Functional complex;Verbal complex Sustained Attention: Impaired Sustained Attention Impairment: Verbal complex;Functional complex Memory: Impaired Memory Impairment: Retrieval deficit;Storage deficit;Decreased recall of new information Immediate Memory Recall: Sock Memory Recall Sock: With Cue Memory Recall Blue: Without Cue Memory Recall Bed: With Cue Awareness: Impaired Awareness Impairment: Emergent impairment Problem Solving: Impaired Problem Solving Impairment: Verbal basic;Functional basic Executive Function: Sequencing;Reasoning;Decision Making;Initiating;Self Monitoring;Self Correcting Reasoning: Impaired Reasoning Impairment: Verbal basic;Verbal complex;Functional basic;Functional complex Sequencing: Appears intact Decision Making: Impaired Decision Making Impairment: Verbal basic;Verbal complex;Functional basic;Functional complex Initiating: Appears intact Self Monitoring: Impaired Self Monitoring Impairment: Verbal basic;Verbal complex;Functional basic;Functional complex Self Correcting: Impaired Self Correcting Impairment: Verbal basic;Verbal complex;Functional basic;Functional complex Safety/Judgment: Impaired  Comprehension Auditory Comprehension Overall Auditory Comprehension: Appears within functional limits for tasks assessed Commands: Impaired Two Step Basic Commands: 75-100% accurate Multistep Basic Commands: 50-74% accurate Conversation: Complex Interfering Components: Attention;Processing speed;Working Field seismologist: Extra processing time;Repetition Expression Expression Primary Mode  of Expression: Verbal Verbal Expression Overall Verbal Expression: Appears within functional limits for tasks assessed Written Expression Dominant Hand: Left Oral Motor Oral Motor/Sensory Function Overall Oral Motor/Sensory Function: Within functional limits Motor Speech Overall Motor Speech: Appears within functional limits for tasks assessed   Short Term Goals: Week 1: SLP Short Term Goal 1 (Week 1): Pt will demonstrated sustained attention in functional tasks in 10 minute intervals with min A verbal cues for redirection. SLP Short Term Goal 2 (Week 1): Pt will demonstrate recall of novel and daily information with use of visual aid (memory notebook) given min A verbal cues. SLP Short Term Goal 3 (Week 1): Pt will complete basic problem solving tasks with min A verbal cues. SLP Short Term Goal 4 (Week 1): Pt will self-monitor and self-correct functional errors during basic problem solving tasks with min A verbal cues.  Refer to Care Plan for Long Term Goals  Recommendations for other services: None   Discharge Criteria: Patient will be discharged from SLP if patient refuses treatment 3 consecutive times without medical reason, if treatment goals not met, if there is a change in medical status, if patient makes no progress towards goals or if patient is discharged from hospital.  The above assessment, treatment plan, treatment alternatives and goals were discussed and mutually agreed upon: by patient  Patricia Stanley  [facility] 09/28/2019, 4:03 PM

## 2019-09-28 NOTE — Patient Care Conference (Signed)
Inpatient RehabilitationTeam Conference and Plan of Care Update Date: 09/28/2019   Time: 2:56 PM    Patient Name: Patricia Stanley      Medical Record Number: [id number]  Date of Birth: 08/12/1945 Sex: Female         Room/Bed: 4M13C/4M13C-01 Payor Info: Payor: [employer] MEDICARE / Plan: [place] HMO / Product Type: *No Product type* /    Admit Date/Time:  09/27/2019  2:46 PM  Primary Diagnosis:  CVA (cerebral vascular accident) [facility])  Patient Active Problem List   Diagnosis Date Noted  . Spinal stenosis in cervical region   . Anemia of chronic disease   . Chronic diarrhea   . Supplemental oxygen dependent   . Hypoalbuminemia due to protein-calorie malnutrition ([place])   . Prediabetes   . Leukocytosis   . AKI (acute kidney injury) ([place])   . Transaminitis   . Obstipation 09/21/2019  . CVA (cerebral vascular accident) ([place]) 09/21/2019  . Symptomatic anemia 09/21/2019  . Generalized body aches 09/21/2019  . Bronchiectasis ([place]) 09/21/2019  . Palliative care by specialist   . Goals of care, counseling/discussion   . DNR (do not resuscitate)   . Anemia 09/12/2019  . Physical deconditioning 09/12/2019  . Malnutrition of moderate degree 09/12/2019  . Diarrhea 09/11/2019  . Moderate episode of recurrent major depressive disorder ([place])   . Small vessel cerebrovascular accident (CVA) ([place]) 04/25/2019  . Acute CVA (cerebrovascular accident) ([place]) 04/18/2019  . Weight loss, unintentional 04/18/2019  . Anxiety 04/18/2019  . Major depression in partial remission ([place]) 08/25/2012  . Osteoporosis, unspecified 08/25/2012  . HTN (hypertension) 08/25/2012  . Fall at home 08/24/2012  . Pelvic fracture ([place]) 08/24/2012  . Migraines 08/24/2012  . Renal insufficiency, mild 08/24/2012    Expected Discharge Date: Expected Discharge Date: (Evaluations incomplete; reconference next week)  Team Members Present: Physician leading conference: Dr. Delice Lesch Care Coodinator Present: Nestor Lewandowsky, RN, BSN, CRRN;Christina Sampson Goon, BSW Nurse Present: Doy Hutching, LPN PT Present: Deniece Ree, PT OT Present: Meriel Pica, OT SLP Present: Charolett Bumpers, SLP PPS Coordinator present : Ileana Ladd, PT     Current Status/Progress Goal Weekly Team Focus  Bowel/Bladder   patient is incontinent of bladder  and incontinent/continent of bowels. LBM 09/27/19  obtain continence of both bowel and bladder  q2-3 hours and PRN toileting needs   Swallow/Nutrition/ Hydration   eval pending         ADL's   eval pending         Mobility   eval limited due to nausea - only tolerated bed mobility mod assist, lateral scoot max assist and sitting balance CGA w/ posterior lean  supervision-CGA  OOB activity, L NMR, coorindation, standing, transfers, gait   Communication   eval pending         Safety/Cognition/ Behavioral Observations  eval pending         Pain   patient c/o severe pain in both legs that radiates to lower back, feet, and ankles  pain level <3 with or without activity  q shift and PRN pain assessment   Skin   patient has heat rash in the back  prevent skin from breakdown  q shift and PRN skin assessment    Rehab Goals Patient on target to meet rehab goals: Yes Rehab Goals Revised: patient being evaluated. *See Care Plan and progress notes for long and short-term goals.     Barriers to Discharge  Current Status/Progress Possible Resolutions Date Resolved   Nursing  Weight  patient has lost a lot of weight due to diarrhea            PT  Inaccessible home environment;Nutrition means  6 STE              OT                  SLP                Care Coordinator     PT and nursing reported patient is very fatigue and sick this morning          Discharge Planning/Teaching Needs:  Patient plans to discharge home with daughter and spouse to provide care  Will schedule will daughter and spouse if recommended by therapy   Team Discussion:  Therapy evaluations are  pending. Patient progress affected by AKI,  anemia, dehydration, nausea, and fatigue. MD encouraged fluids and re-check labs. N  Revisions to Treatment Plan:  None    Medical Summary Current Status: Left side weakness with decreased functional mobility secondary to multifocal punctate anterior and posterior infarcts on 09/20/2019 as well as history of CVA December 2020 with residual left-sided weakness as well as?  Left C5 cervical radiculopathy/spinal stenosis Weekly Focus/Goal: Improve mobility, anemia, bowel issues, nutrition, supplement O2 requirement, optimize BP/DM meds, WBCs, AKI, LFTs  Barriers to Discharge: Medical stability;Nutrition means;Incontinence   Possible Resolutions to Barriers: Therapies, follow labs, encourage fluids, optimize BP meds, wean supplemental O2   Continued Need for Acute Rehabilitation Level of Care: The patient requires daily medical management by a physician with specialized training in physical medicine and rehabilitation for the following reasons: Direction of a multidisciplinary physical rehabilitation program to maximize functional independence : Yes Medical management of patient stability for increased activity during participation in an intensive rehabilitation regime.: Yes Analysis of laboratory values and/or radiology reports with any subsequent need for medication adjustment and/or medical intervention. : Yes   I attest that I was present, lead the team conference, and concur with the assessment and plan of the team.   Chana Bode B 09/28/2019, 2:56 PM

## 2019-09-28 NOTE — Discharge Instructions (Signed)
Inpatient Rehab Discharge Instructions  Patricia Stanley Discharge date and time: No discharge date for patient encounter.   Activities/Precautions/ Functional Status: Activity: activity as tolerated Diet: regular diet Wound Care: none needed Functional status:  ___ No restrictions     ___ Walk up steps independently ___ 24/7 supervision/assistance   ___ Walk up steps with assistance ___ Intermittent supervision/assistance  ___ Bathe/dress independently ___ Walk with walker     _x__ Bathe/dress with assistance ___ Walk Independently    ___ Shower independently ___ Walk with assistance    ___ Shower with assistance ___ No alcohol     ___ Return to work/school ________  Special Instructions: No driving smoking or alcohol STROKE/TIA DISCHARGE INSTRUCTIONS SMOKING Cigarette smoking nearly doubles your risk of having a stroke & is the single most alterable risk factor  If you smoke or have smoked in the last 12 months, you are advised to quit smoking for your health.  Most of the excess cardiovascular risk related to smoking disappears within a year of stopping.  Ask you doctor about anti-smoking medications  [place] Quit Line: 1-800-QUIT NOW  Free Smoking Cessation Classes [phone]  CHOLESTEROL Know your levels; limit fat & cholesterol in your diet  Lipid Panel     Component Value Date/Time   CHOL 59 09/21/2019 0226   TRIG 53 09/21/2019 0226   HDL 20 (L) 09/21/2019 0226   CHOLHDL 3.0 09/21/2019 0226   VLDL 11 09/21/2019 0226   LDLCALC 28 09/21/2019 0226      Many patients benefit from treatment even if their cholesterol is at goal.  Goal: Total Cholesterol (CHOL) less than 160  Goal:  Triglycerides (TRIG) less than 150  Goal:  HDL greater than 40  Goal:  LDL (LDLCALC) less than 100   BLOOD PRESSURE American Stroke Association blood pressure target is less that 120/80 mm/Hg  Your discharge blood pressure is:  BP: (!) 174/83  Monitor your blood pressure  Limit your  salt and alcohol intake  Many individuals will require more than one medication for high blood pressure  DIABETES (A1c is a blood sugar average for last 3 months) Goal HGBA1c is under 7% (HBGA1c is blood sugar average for last 3 months)  Diabetes: No known diagnosis of diabetes    Lab Results  Component Value Date   HGBA1C 5.3 09/21/2019     Your HGBA1c can be lowered with medications, healthy diet, and exercise.  Check your blood sugar as directed by your physician  Call your physician if you experience unexplained or low blood sugars.  PHYSICAL ACTIVITY/REHABILITATION Goal is 30 minutes at least 4 days per week  Activity: Increase activity slowly, Therapies: Physical Therapy: Home Health Return to work:   Activity decreases your risk of heart attack and stroke and makes your heart stronger.  It helps control your weight and blood pressure; helps you relax and can improve your mood.  Participate in a regular exercise program.  Talk with your doctor about the best form of exercise for you (dancing, walking, swimming, cycling).  DIET/WEIGHT Goal is to maintain a healthy weight  Your discharge diet is:  Diet Order            Diet regular Room service appropriate? Yes with Assist; Fluid consistency: Thin  Diet effective now              liquids Your height is:  Height: 5\' 1"  (154.9 cm) Your current weight is: Weight: 41.8 kg Your Body Mass Index (  BMI) is:  BMI (Calculated): 17.42  Following the type of diet specifically designed for you will help prevent another stroke.  Your goal weight range is:    Your goal Body Mass Index (BMI) is 19-24.  Healthy food habits can help reduce 3 risk factors for stroke:  High cholesterol, hypertension, and excess weight.  RESOURCES Stroke/Support Group:  Call [phone]   STROKE EDUCATION PROVIDED/REVIEWED AND GIVEN TO PATIENT Stroke warning signs and symptoms How to activate emergency medical system (call 911). Medications  prescribed at discharge. Need for follow-up after discharge. Personal risk factors for stroke. Pneumonia vaccine given:  Flu vaccine given:  My questions have been answered, the writing is legible, and I understand these instructions.  I will adhere to these goals & educational materials that have been provided to me after my discharge from the hospital.      My questions have been answered and I understand these instructions. I will adhere to these goals and the provided educational materials after my discharge from the hospital.  Patient/Caregiver Signature _______________________________ Date __________  Clinician Signature _______________________________________ Date __________  Please bring this form and your medication list with you to all your follow-up doctor's appointments.

## 2019-09-28 NOTE — Progress Notes (Addendum)
[facility] PHYSICAL MEDICINE & REHABILITATION PROGRESS NOTE  Subjective/Complaints: Patient seen laying in bed this morning.  She states she slept well overnight.  She states she feels sick this morning.  When asked to specify, she states it is "in my ankle".  ROS: Denies CP, SOB, N/V/D  Objective: Vital Signs: Blood pressure (!) 172/83, pulse 95, temperature 98.3 F (36.8 C), temperature source Oral, resp. rate 18, height 5\' 1"  (1.549 m), weight 41.8 kg, SpO2 91 %. DG Chest 2 View  Result Date: 09/27/2019 CLINICAL DATA:  Acute respiratory failure EXAM: CHEST - 2 VIEW COMPARISON:  09/21/2019 FINDINGS: Cardiac shadow is stable. Aortic calcifications are again seen. Patchy opacities are again identified bilaterally stable from the prior exam. No new focal abnormality is seen. No bony abnormality is noted. IMPRESSION: Stable patchy opacities bilaterally Electronically Signed   By: 09/23/2019 M.D.   On: 09/27/2019 08:43   Recent Labs    09/27/19 0547 09/28/19 0542  WBC 14.3* 13.3*  HGB 7.8* 7.5*  HCT 24.7* 23.5*  PLT 219 312   Recent Labs    09/27/19 0547 09/28/19 0542  NA 142 137  K 4.3 4.0  CL 104 102  CO2 28 27  GLUCOSE 112* 112*  BUN 30* 33*  CREATININE 1.32* 1.35*  CALCIUM 8.6* 8.5*    Physical Exam: BP (!) 172/83 (BP Location: Left Arm)   Pulse 95   Temp 98.3 F (36.8 C) (Oral)   Resp 18   Ht 5\' 1"  (1.549 m)   Wt 41.8 kg   SpO2 91%   BMI 17.41 kg/m  Constitutional: No distress . Vital signs reviewed.  Cachectic. HENT: Normocephalic.  Atraumatic. Eyes: EOMI. No discharge. Cardiovascular: No JVD. Respiratory: Normal effort.  No stridor.  + [place] [facility]: Non-distended. Skin: Warm and dry.  Intact. Psych: Normal mood.  Normal behavior. Musc: No edema in extremities.  No tenderness in extremities, including bilateral ankles. Neuro: Alert and oriented x3, cannot name President "old White haired man". HOH Motor: RUE 4+/5 proximal distal LUE: Shoulder abduction,  elbow flexion/extension 4 -/5, distally 0/5 Right lower extremity: 4+/5 proximal distal Left lower extremity: Hip flexion, knee extension 4/5, ankle dorsiflexion 0/5  Assessment/Plan: 1. Functional deficits secondary to multiple bilateral infarcts, largest on left with ? Radiculopathy which require 3+ hours per day of interdisciplinary therapy in a comprehensive inpatient rehab setting.  Physiatrist is providing close team supervision and 24 hour management of active medical problems listed below.  Physiatrist and rehab team continue to assess barriers to discharge/monitor patient progress toward functional and medical goals  Care Tool:  Bathing              Bathing assist       Upper Body Dressing/Undressing Upper body dressing   What is the patient wearing?: Hospital gown only    Upper body assist Assist Level: Maximal Assistance - Patient 25 - 49%    Lower Body Dressing/Undressing Lower body dressing      What is the patient wearing?: Incontinence brief     Lower body assist Assist for lower body dressing: Maximal Assistance - Patient 25 - 49%     Toileting Toileting    Toileting assist Assist for toileting: Total Assistance - Patient < 25%     Transfers Chair/bed transfer  Transfers assist  Chair/bed transfer activity did not occur: Safety/medical concerns  Chair/bed transfer assist level: Moderate Assistance - Patient 50 - 74%     Locomotion Ambulation   Ambulation assist  Walk 10 feet activity   Assist           Walk 50 feet activity   Assist           Walk 150 feet activity   Assist           Walk 10 feet on uneven surface  activity   Assist           Wheelchair     Assist               Wheelchair 50 feet with 2 turns activity    Assist            Wheelchair 150 feet activity     Assist            Medical Problem List and Plan: 1.  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  Left C5 cervical radiculopathy/spinal stenosis  Begin CIR evaluations  Imaging personally reviewed, largest infarct on left, C-spine abnormalities as well  Team conference today to discuss current and goals and coordination of care, home and environmental barriers, and discharge planning with nursing, case manager, and therapies.  2.  Antithrombotics: -DVT/anticoagulation: SCDs.             -antiplatelet therapy: Aspirin 325 mg daily 3. Pain Management: Tramadol as needed 4. Mood: Klonopin 0.5 mg twice daily, Remeron 50 mg nightly             -antipsychotic agents: N/A 5. Neuropsych: This patient is capable of making decisions on her own behalf. 6. Skin/Wound Care: Routine skin checks 7. Fluids/Electrolytes/Nutrition: Routine in and outs 8.  Chronic normocytic anemia.    Continue iron supplement.  Followed outpatient by GI services.  Not a candidate for colonoscopy or endoscopy  Hemoglobin 7.5 on 5/26  Continue to monitor 9.  Chronic diarrhea  Cont Creon.  Followed outpatient by GI services.   Started Miralax daily   Lactulose prn  KUB ordered 10.  Unintentional weight loss.  CT abdomen pelvis unremarkable.  Dietary follow-up  Daily weights Filed Weights   09/27/19 1525  Weight: 41.8 kg   11.  Hyperlipidemia.  Lipitor 12.  GERD.  Protonix 13.  Supplemental oxygen dependent   Wean as tolerated 14.  Hypertension  Monitor with increased mobility 15.  Prediabetes  Monitor with increased mobility 16.  Leukocytosis  WBC 13.3 on 5/26  Afebrile  Continue to monitor  17.  AKI  Creatinine 1.35 on 5/26, labs ordered for tomorrow  Encourage fluids  Continue to monitor 18.  Severe hypoalbuminemia with malnutrition  Supplement increased on 5/26 19.  Transaminitis  AST elevated on 5/26, labs  ordered for tomorrow  Continue monitor  LOS: 1 days A FACE TO FACE EVALUATION WAS PERFORMED  Ryleah Miramontes Lorie Phenix 09/28/2019, 8:24 AM

## 2019-09-28 NOTE — Progress Notes (Signed)
Initial Nutrition Assessment  RD working remotely.  DOCUMENTATION CODES:   Underweight, suspect malnutrition but unable to confirm at this time  INTERVENTION:   - Ordered feeding assistance with meals  - Continue Ensure Enlive po TID, each supplement provides 350 kcal and 20 grams of protein  - Continue 60 ml Pro-stat po BID, each supplement provides 100 kcals and 15 grams protein  - Magic cup TID with meals, each supplement provides 290 kcal and 9 grams of protein  - Continue MVI with minerals daily  NUTRITION DIAGNOSIS:   Increased nutrient needs related to other (therapies) as evidenced by estimated needs.  GOAL:   Patient will meet greater than or equal to 90% of their needs  MONITOR:   PO intake, Supplement acceptance, Labs, Weight trends  REASON FOR ASSESSMENT:   Malnutrition Screening Tool    ASSESSMENT:   74 year old female with PMH of CVA December 2020 with residual left-sided weakness. Pt received inpatient rehab services 04/25/19 to 05/10/19. PMH also includes HLD, anemia, HTN, depression, chronic diarrhea, carotid artery stenosis with CEA 12/20. Presented 09/20/19 with increased left arm weakness and numbness as well as left leg weakness with associated dyspnea and unintentional weight loss. Small focal hypodensity in the left cerebellum reflecting possible indeterminate lacunar infarction. MRI showed 8 mm acute ischemic left basal ganglia infarction. Associated petechial hemorrhage without frank hemorrhagic transformation. Multiple scattered chronic microhemorrhages involving both cerebral and cerebellar hemispheres most pronounced about the deep gray nuclei. Admitted to CIR on 5/25.   Spoke with pt's husband via phone call to room. Pt's husband reports that he is not able to hear RD well and provided a brief history. RD will attempt to obtain additional information at follow-up.  Pt's husband shares that pt ate fairly well at breakfast this morning (some grits,  juice, [place] toast). He believes she could have eaten more if someone helped her. RD will order feeding assistance with meals.  Pt's husband shares that pt has never been "a big eater" due to her chronic diarrhea which he reports pt is not having at this time.  Per MAR, pt accepting Ensure Enlive and Pro-stat supplements. Will continue with current supplement order.  Reviewed weight history in chart. Pt with weight gain from December 2020 to January 2021 but has lost weight since that time. Pt with an overall 3.6 kg weight loss since 05/20/19. This is a 7.9% weight loss in 4.5 months which is not quite significant for timeframe. Suspect pt with malnutrition but RD unable to confirm without NFPE.  Meal Completion: 65% x 1 recorded meal  Medications reviewed and include: Ensure Enlive TID, Pro-stat 60 ml BID, ferrous sulfate, folic acid, Remeron, MVI with minerals, Zofran 4 mg TID before meals, protonix, miralax, senna  Labs reviewed: BUN 33, creatinine 1.35, hemoglobin 7.5  NUTRITION - FOCUSED PHYSICAL EXAM:  Unable to complete at this time. RD working remotely.  Diet Order:   Diet Order            Diet regular Room service appropriate? Yes with Assist; Fluid consistency: Thin  Diet effective now              EDUCATION NEEDS:   No education needs have been identified at this time  Skin:  Skin Assessment: Reviewed RN Assessment  Last BM:  09/27/19  Height:   Ht Readings from Last 1 Encounters:  09/27/19 5\' 1"  (1.549 m)    Weight:   Wt Readings from Last 1 Encounters:  09/27/19 41.8 kg  Ideal Body Weight:  47.7 kg  BMI:  Body mass index is 17.41 kg/m.  Estimated Nutritional Needs:   Kcal:  1400-1600  Protein:  65-80 grams  Fluid:  1.4-1.6 L    Earma Reading, MS, RD, LDN Inpatient Clinical Dietitian Pager: [phone] Weekend/After Hours: [phone]

## 2019-09-28 NOTE — Progress Notes (Signed)
Patient c/o pain in both legs that radiates to ankles and foot. Pain level is 9 and it's aching and burning. Patient stated "I feel miserable, I can't take this pain anymore." Nurse administered PRN Tramadol at 2150. Around 2300 patient called and c/o pain. Nurse went to check patient. Patient is crying stating " I want to go home, I'm in so much pain and I need my husband." When asked about pain level and location, patient reported pain level is 10 and it still in leg that radiates to feet and ankles. Nurse administered PRN Tylenol at 2321 for pain and put heating packs in both legs. Around 1:00 AM during rounds, patient is restless, moaning and not wearing the oxygen cannula. Nurse assessed patient about the pain. Patient stated "nothing is working". Nurse called on-call Jacalyn Lefevre, NP to notified patient uncontrolled pain. NP provided one-time order of NORCO 5-325 mg tab PO one-time dose scheduled to be given at 4:00 AM. Nurse informed patient about one-time dose of pain medication and also nurse repositioned patient and re-applied heating packs in legs and lower back.

## 2019-09-28 NOTE — Progress Notes (Signed)
Inpatient Rehabilitation Care Coordinator Assessment and Plan  Patient Details  Name: Patricia Stanley MRN: [id number] Date of Birth: 08/10/1945  Today's Date: 09/28/2019  Problem List:  Patient Active Problem List   Diagnosis Date Noted  . Spinal stenosis in cervical region   . Anemia of chronic disease   . Chronic diarrhea   . Supplemental oxygen dependent   . Hypoalbuminemia due to protein-calorie malnutrition ([place])   . Prediabetes   . Leukocytosis   . AKI (acute kidney injury) ([place])   . Transaminitis   . Obstipation 09/21/2019  . CVA (cerebral vascular accident) ([place]) 09/21/2019  . Symptomatic anemia 09/21/2019  . Generalized body aches 09/21/2019  . Bronchiectasis ([place]) 09/21/2019  . Palliative care by specialist   . Goals of care, counseling/discussion   . DNR (do not resuscitate)   . Anemia 09/12/2019  . Physical deconditioning 09/12/2019  . Malnutrition of moderate degree 09/12/2019  . Diarrhea 09/11/2019  . Moderate episode of recurrent major depressive disorder ([place])   . Small vessel cerebrovascular accident (CVA) ([place]) 04/25/2019  . Acute CVA (cerebrovascular accident) ([place]) 04/18/2019  . Weight loss, unintentional 04/18/2019  . Anxiety 04/18/2019  . Major depression in partial remission ([place]) 08/25/2012  . Osteoporosis, unspecified 08/25/2012  . HTN (hypertension) 08/25/2012  . Fall at home 08/24/2012  . Pelvic fracture ([place]) 08/24/2012  . Migraines 08/24/2012  . Renal insufficiency, mild 08/24/2012   Past Medical History:  Past Medical History:  Diagnosis Date  . Closed fracture of coccyx ([place]) 2015   AFTER FALL  . Depression   . GERD (gastroesophageal reflux disease)   . Headache    MIGRAINES OCCASIONAL  . History of kidney stones    HAS NOW  . Hypertension    Past Surgical History:  Past Surgical History:  Procedure Laterality Date  . ABDOMINAL HYSTERECTOMY     1 OVARY LEFT  . APPENDECTOMY  AGE 89   DONE WITH OVARY REMOVAL  . COLONOSCOPY  WITH PROPOFOL N/A 12/03/2015   Procedure: COLONOSCOPY WITH PROPOFOL;  Surgeon: Garlan Fair, MD;  Location: [facility];  Service: Endoscopy;  Laterality: N/A;  . ENDARTERECTOMY Right 04/21/2019   Procedure: ENDARTERECTOMY CAROTID;  Surgeon: Marty Heck, MD;  Location: [facility];  Service: Vascular;  Laterality: Right;  . ESOPHAGOGASTRODUODENOSCOPY (EGD) WITH PROPOFOL N/A 12/03/2015   Procedure: ESOPHAGOGASTRODUODENOSCOPY (EGD) WITH PROPOFOL;  Surgeon: Garlan Fair, MD;  Location: [facility];  Service: Endoscopy;  Laterality: N/A;  . TONSILLECTOMY  AGE 48   AND ADENOIDS REMOVED   Social History:  reports that she quit smoking about 24 years ago. She has never used smokeless tobacco. She reports that she does not drink alcohol or use drugs.  Family / Support Systems Patient Roles: Spouse Spouse/Significant Other: Rober Minion Children: Daughter (Tammie) Lives with patient Other Supports: spouse Anticipated Caregiver: Daughter and Spouse Ability/Limitations of Caregiver: none Caregiver Availability: 24/7  Social History Preferred language: English Religion: None Read: Yes Write: Yes   Abuse/Neglect Abuse/Neglect Assessment Can Be Completed: Yes Physical Abuse: Denies Verbal Abuse: Denies Sexual Abuse: Denies Exploitation of patient/patient's resources: Denies Self-Neglect: Denies  Emotional Status Pt's affect, behavior and adjustment status: husband feels like patient is more angry Recent Psychosocial Issues: no Psychiatric History: no Substance Abuse History: no  Patient / Family Perceptions, Expectations & Goals Pt/Family understanding of illness & functional limitations: yes Pt/family expectations/goals: Patient plans to return back home  [place]: None Premorbid Home Care/DME Agencies: (Patient previously had [facility] and  DME ordered (will follow up with family on specifics)) Transportation available at discharge: family  able to transport  Discharge Planning Living Arrangements: Spouse/significant other, Children Support Systems: Children, Spouse/significant other Type of Residence: Private residence(4 steps no railins Sales promotion account executive), 6 Steps, no railings (Back door-easiest entrance)) Insurance Resources: Media planner (specify)([employer]) Financial Screen Referred: Yes Money Management: Spouse, Patient Does the patient have any problems obtaining your medications?: No Care Coordinator Barriers to Discharge: Medical stability, Home environment access/layout Care Coordinator Anticipated Follow Up Needs: HH/OP  Clinical Impression Sw entered room. Patient laying down resting. Spouse attempting to keep patient awake. Patient upset due to him keeping her aroused. Sw followed up with spouse questions and concerns  Andria Rhein 09/28/2019, 3:21 PM

## 2019-09-28 NOTE — Progress Notes (Signed)
Inpatient Rehabilitation Center Individual Statement of Services  Patient Name:  Patricia Stanley  Date:  09/28/2019  Welcome to the Inpatient Rehabilitation Center.  Our goal is to provide you with an individualized program based on your diagnosis and situation, designed to meet your specific needs.  With this comprehensive rehabilitation program, you will be expected to participate in at least 3 hours of rehabilitation therapies Monday-Friday, with modified therapy programming on the weekends.  Your rehabilitation program will include the following services:  Physical Therapy (PT), Occupational Therapy (OT), Speech Therapy (ST), 24 hour per day rehabilitation nursing, Therapeutic Recreaction (TR), Neuropsychology, Care Coordinator, Rehabilitation Medicine, Nutrition Services, Pharmacy Services and Other  Weekly team conferences will be held on Wednesdays to discuss your progress.  Your Inpatient Rehabilitation Care Coordinator will talk with you frequently to get your input and to update you on team discussions.  Team conferences with you and your family in attendance may also be held.  Expected length of stay: 10-14 Days  Overall anticipated outcome: Min A  Depending on your progress and recovery, your program may change. Your Inpatient Rehabilitation Care Coordinator will coordinate services and will keep you informed of any changes. Your Inpatient Rehabilitation Care Coordinator's name and contact numbers are listed  below.  The following services may also be recommended but are not provided by the Inpatient Rehabilitation Center:    Home Health Rehabiltiation Services  Outpatient Rehabilitation Services    Arrangements will be made to provide these services after discharge if needed.  Arrangements include referral to agencies that provide these services.  Your insurance has been verified to be:  [employer] Your primary doctor is:  Marden Noble, MD  Pertinent information will be shared  with your doctor and your insurance company.  Inpatient Rehabilitation Care Coordinator:  Lavera Guise, [place] [phone] or [phone]  Information discussed with and copy given to patient by: Andria Rhein, 09/28/2019, 3:22 PM

## 2019-09-28 NOTE — Progress Notes (Signed)
Team Conference Report to Patient/Family  Team Conference discussion was reviewed with the patient and caregiver, including goals, any changes in plan of care and target discharge date.  Patient and caregiver express understanding and are in agreement.  The patient has a target discharge date of (Evaluations incomplete; reconference next week).  Andria Rhein 09/28/2019, 3:10 PM Patient ID: Patricia Stanley, female   DOB: December 27, 1945, 74 y.o.   MRN: [id number]

## 2019-09-28 NOTE — Progress Notes (Signed)
Inpatient Rehabilitation  Patient information reviewed and entered into eRehab system by Neha Waight M. Jawana Reagor, M.A., CCC/SLP, PPS Coordinator.  Information including medical coding, functional ability and quality indicators will be reviewed and updated through discharge.    

## 2019-09-28 NOTE — Evaluation (Signed)
Physical Therapy Assessment and Plan  Patient Details  Name: Patricia Stanley MRN: [id number] Date of Birth: [birth year]  PT Diagnosis: Abnormal posture, Cognitive deficits, Hemiparesis non-dominant, Impaired cognition, Impaired sensation and Muscle weakness Rehab Potential: Good ELOS: 14-18 days   Today's Date: 09/28/2019 PT Individual Time: 0801-0828 and [id number] PT Individual Time Calculation (min): 27 min and 30 min     Problem List:  Patient Active Problem List   Diagnosis Date Noted  . Obstipation 09/21/2019  . CVA (cerebral vascular accident) ([place]) 09/21/2019  . Symptomatic anemia 09/21/2019  . Generalized body aches 09/21/2019  . Bronchiectasis ([place]) 09/21/2019  . Palliative care by specialist   . Goals of care, counseling/discussion   . DNR (do not resuscitate)   . Anemia 09/12/2019  . Physical deconditioning 09/12/2019  . Malnutrition of moderate degree 09/12/2019  . Diarrhea 09/11/2019  . Moderate episode of recurrent major depressive disorder ([place])   . Small vessel cerebrovascular accident (CVA) ([place]) 04/25/2019  . Acute CVA (cerebrovascular accident) ([place]) 04/18/2019  . Weight loss, unintentional 04/18/2019  . Anxiety 04/18/2019  . Major depression in partial remission ([place]) 08/25/2012  . Osteoporosis, unspecified 08/25/2012  . HTN (hypertension) 08/25/2012  . Fall at home 08/24/2012  . Pelvic fracture ([place]) 08/24/2012  . Migraines 08/24/2012  . Renal insufficiency, mild 08/24/2012    Past Medical History:  Past Medical History:  Diagnosis Date  . Closed fracture of coccyx ([place]) 2015   AFTER FALL  . Depression   . GERD (gastroesophageal reflux disease)   . Headache    MIGRAINES OCCASIONAL  . History of kidney stones    HAS NOW  . Hypertension    Past Surgical History:  Past Surgical History:  Procedure Laterality Date  . ABDOMINAL HYSTERECTOMY     1 OVARY LEFT  . APPENDECTOMY  AGE 12   DONE WITH OVARY REMOVAL  . COLONOSCOPY WITH PROPOFOL N/A  12/03/2015   Procedure: COLONOSCOPY WITH PROPOFOL;  Surgeon: Garlan Fair, MD;  Location: [facility];  Service: Endoscopy;  Laterality: N/A;  . ENDARTERECTOMY Right 04/21/2019   Procedure: ENDARTERECTOMY CAROTID;  Surgeon: Marty Heck, MD;  Location: [facility];  Service: Vascular;  Laterality: Right;  . ESOPHAGOGASTRODUODENOSCOPY (EGD) WITH PROPOFOL N/A 12/03/2015   Procedure: ESOPHAGOGASTRODUODENOSCOPY (EGD) WITH PROPOFOL;  Surgeon: Garlan Fair, MD;  Location: [facility];  Service: Endoscopy;  Laterality: N/A;  . TONSILLECTOMY  AGE 89   AND ADENOIDS REMOVED    Assessment & Plan Clinical Impression: Patient is a 74 y.o. year old female with history of CVA December 2020 with residual left-sided weakness and received inpatient rehab services 04/25/2019 to 05/10/2019 and maintained on aspirin, hyperlipidemia, chronic normocytic anemia, hypertension, depression, chronic diarrhea, carotid artery stenosis with CEA 04/2019.  Per chart review patient lives with spouse.  1 level home 6 steps to entry.  Ambulates with a rolling walker but needed some additional support over the past month due to failure to thrive.  Presented 09/20/2019 with increased left arm weakness and numbness as well as left leg weakness with associated dyspnea and unintentional weight loss.  Cranial CT scan negative for intracranial hemorrhage or territorial infarct.  Small focal hypodensity in the left cerebellum reflecting possible indeterminate lacunar infarction.  Patient did not receive TPA.  MRI showed 8 mm acute ischemic left basal ganglia infarction.  Associated petechial hemorrhage without frank hemorrhagic transformation.  Multiple scattered chronic microhemorrhages involving both cerebral and cerebellar hemispheres most pronounced about the deep gray nuclei.  MRI cervical  spine degenerative disc osteophyte C4-5 with resultant moderate spinal stenosis.  CT angiogram of the chest showed no demonstration of pulmonary  emboli.  CT angiogram head and neck right carotid enterectomy widely patent.  No significant stenosis in the left carotid bifurcation.  Negative for intracranial large vessel occlusion.  CT abdomen pelvis showed no hydronephrosis, moderate colonic stool burden otherwise unremarkable.  Echocardiogram with ejection fraction of 60% no wall motion abnormalities.  Noted admission chemistries hemoglobin 7.1 as well as hemoglobin 7.2 09/16/2019, WBC 10.6, chloride 113, BUN 22, urinalysis negative nitrite, troponin 33, SARS coronavirus negative, sedimentation rate 29.  Venous Doppler studies negative for DVT.  Maintained on aspirin 325 mg daily for CVA prophylaxis.  No DAPT due to anemia.  Her latest hemoglobin was 7.3.   Patient was transfused 1 unit of packed red blood cells.  Patient had recently been seen by Dr. Paulita Fujita for gastroenterology services for history of anemia did not feel any work-up was needed as she appeared to be stable hemoglobin over the last couple of years.  Patient not felt to be a candidate at this time for endoscopy or colonoscopy.  Therapy evaluations completed and patient was admitted for a comprehensive rehab program.  .  Patient transferred to CIR on 09/27/2019 .   Patient currently requires mod with mobility secondary to muscle weakness, impaired timing and sequencing and decreased coordination, decreased attention and decreased sitting balance, decreased standing balance, decreased postural control, hemiplegia and decreased balance strategies.  Prior to hospitalization, patient was min with mobility and lived with Spouse, Daughter in a House home.  Home access is 6Stairs to enter.  Patient will benefit from skilled PT intervention to maximize safe functional mobility, minimize fall risk and decrease caregiver burden for planned discharge home with 24 hour assist.  Anticipate patient will benefit from follow up [facility] at discharge.  PT - End of Session Activity Tolerance: Tolerates 10 - 20  min activity with multiple rests Endurance Deficit: Yes PT Assessment Rehab Potential (ACUTE/IP ONLY): Good PT Barriers to Discharge: Inaccessible home environment;Nutrition means PT Barriers to Discharge Comments: 6 STE PT Patient demonstrates impairments in the following area(s): Balance;Behavior;Motor;Endurance;Safety;Sensory PT Transfers Functional Problem(s): Bed Mobility;Bed to Chair;Car;Furniture PT Locomotion Functional Problem(s): Ambulation;Wheelchair Mobility;Stairs PT Plan PT Intensity: Minimum of 1-2 x/day ,45 to 90 minutes PT Frequency: 5 out of 7 days PT Duration Estimated Length of Stay: 14-18 days PT Treatment/Interventions: Ambulation/gait training;Community reintegration;DME/adaptive equipment instruction;Neuromuscular re-education;UE/LE Strength taining/ROM;Stair Museum/gallery conservator;Wheelchair propulsion/positioning;Discharge planning;Pain management;Skin care/wound management;Therapeutic Activities;UE/LE Coordination activities;Disease management/prevention;Functional mobility training;Patient/family education;Splinting/orthotics;Therapeutic Exercise;Visual/perceptual remediation/compensation;Cognitive remediation/compensation;Psychosocial support PT Transfers Anticipated Outcome(s): CGA PT Locomotion Anticipated Outcome(s): CGA PT Recommendation Follow Up Recommendations: Home health PT Patient destination: Home Equipment Recommended: To be determined  Skilled Therapeutic Intervention Session 1: Evaluation completed (see details above and below) with education on PT POC and goals and individual treatment initiated with focus on bed mobility, activity tolerance and balance. Pt supine in bed upon PT arrival, pt denies pain but does report nausea this morning, saying "I feel sick, I can't get up." Pt willing to try sitting EOB this session but declines any further activity secondary to nausea. Pt transferred supine<>sitting this session with mod assist, pt  requiring min assist for sitting balance due to posterior lean. Pt required max assist for laterally scooting towards the head of bed x 2. Pt continues to decline furhter activity secondary to nausea, RN reports she already provided medication. Pt left supine with needs in reach and bed alarm set. Pt missed  33 min of skilled therapy tx secondary to nausea.   Session 2: Pt seated in w/c upon PT arrival, agreeable to therapy tx with encouragement (initially saying she wants to get in bed). Therapist agreed to help patient to bed following therapy session. Pt transported to the gym in w/c. Pt performed x 2 sit<>stands this session with RW and mod assist, cues for techniques and attention to L hand. Pt ambulated x 10 ft this session with RW and mod assist, L knee hyperextension noted and narrow BOS, cues for foot placement. Pt transported to the ortho gym and performed car transfer this session with mod assist, cues for attention to L side. Pt transported back to room, stand pivot to the bed with mod assist. Sit>supine min assist. Pt left supine with needs in reach and bed alarm set, family present.   PT Evaluation Precautions/Restrictions Precautions Precautions: Fall Precaution Comments: L side weakness, global weakness Restrictions Weight Bearing Restrictions: No General   Vital SignsTherapy Vitals Temp: 98.3 F (36.8 C) Temp Source: Oral Pulse Rate: 95 Resp: 18 BP: (!) 172/83 Patient Position (if appropriate): Lying Oxygen Therapy SpO2: 91 % O2 Device: Room Air Pain   Denies pain at rest Home Living/Prior Functioning Home Living Available Help at Discharge: Family;Available 24 hours/day Type of Home: House Home Access: Stairs to enter [employer] of Steps: 6 Entrance Stairs-Rails: Left;Right Home Layout: One level Bathroom Shower/Tub: Multimedia programmer: Standard Bathroom Accessibility: Yes Additional Comments: lives with husband and daughter  Lives With:  Spouse;Daughter Prior Function Level of Independence: Needs assistance with ADLs;Requires assistive device for independence(reports that husband would help her to the bathroom, used RW)  Able to Take Stairs?: Yes(with assist) Comments: requiring assist prior to recent CVA, unsure of PLOF will need to follow up with spouse Cognition Overall Cognitive Status: History of cognitive impairments - at baseline Arousal/Alertness: Awake/alert Orientation Level: Oriented X4 Attention: Focused;Sustained Focused Attention: Impaired Sustained Attention: Impaired Memory: Impaired Sensation Sensation Light Touch: Impaired Detail Central sensation comments: impaired light touch L UE/LE - diminished compared to R side Light Touch Impaired Details: Impaired LUE;Impaired LLE Coordination Gross Motor Movements are Fluid and Coordinated: No Fine Motor Movements are Fluid and Coordinated: No Coordination and Movement Description: coordination impaired secondary to tone and hemiparesis, ataxia of L LE noted Heel Shin Test: impaired L LE Motor  Motor Motor: Abnormal tone;Hemiplegia;Ataxia Motor - Skilled Clinical Observations: L hemiparesis, tone and LE ataxia  Mobility Bed Mobility Bed Mobility: Rolling Left;Sit to Supine;Supine to Sit Rolling Left: Minimal Assistance - Patient > 75% Supine to Sit: Moderate Assistance - Patient 50-74% Sit to Supine: Moderate Assistance - Patient 50-74% Locomotion    10 ft with mod assist and RW Trunk/Postural Assessment  Cervical Assessment Cervical Assessment: Exceptions to WFL(forward head posture) Thoracic Assessment Thoracic Assessment: Exceptions to WFL(rounded shoulders/kyphotic) Lumbar Assessment Lumbar Assessment: Exceptions to WFL(posterior pelvic tilt) Postural Control Postural Control: Deficits on evaluation Postural Limitations: posterior lean in sitting  Balance Balance Balance Assessed: Yes Static Sitting Balance Static Sitting - Level of  Assistance: 4: Min assist(posterior lean) Dynamic Sitting Balance Dynamic Sitting - Level of Assistance: 3: Mod assist;2: Max assist Sitting balance - Comments: max assist for lateral scooting EOB Extremity Assessment  RLE Assessment RLE Assessment: Within Functional Limits LLE Assessment LLE Assessment: Exceptions to [facility] Passive Range of Motion (PROM) Comments: tight hamstrings, DF to neutral General Strength Comments: hemiparsis LLE Strength Left Hip Flexion: 3-/5 Left Hip Extension: 3-/5 Left Knee Flexion: 2+/5 Left Knee  Extension: 3/5 Left Ankle Dorsiflexion: 2+/5 Left Ankle Plantar Flexion: 3-/5    Refer to Care Plan for Long Term Goals  Recommendations for other services: None   Discharge Criteria: Patient will be discharged from PT if patient refuses treatment 3 consecutive times without medical reason, if treatment goals not met, if there is a change in medical status, if patient makes no progress towards goals or if patient is discharged from hospital.  The above assessment, treatment plan, treatment alternatives and goals were discussed and mutually agreed upon: by patient  Netta Corrigan, PT, DPT, CSRS 09/28/2019, 8:37 AM

## 2019-09-28 NOTE — Evaluation (Signed)
Occupational Therapy Assessment and Plan  Patient Details  Name: Patricia Stanley MRN: [id number] Date of Birth: [birth year]  OT Diagnosis: acute pain, altered mental status, ataxia, cognitive deficits, disturbance of vision, hemiplegia affecting dominant side and muscle weakness (generalized) Rehab Potential: Rehab Potential (ACUTE ONLY): Good ELOS: 12 to 14 days   Today's Date: 09/28/2019 OT Individual Time: 1305-1420 OT Individual Time Calculation (min): 75 min     Problem List:  Patient Active Problem List   Diagnosis Date Noted  . Spinal stenosis in cervical region   . Anemia of chronic disease   . Chronic diarrhea   . Supplemental oxygen dependent   . Hypoalbuminemia due to protein-calorie malnutrition ([place])   . Prediabetes   . Leukocytosis   . AKI (acute kidney injury) ([place])   . Transaminitis   . Obstipation 09/21/2019  . CVA (cerebral vascular accident) ([place]) 09/21/2019  . Symptomatic anemia 09/21/2019  . Generalized body aches 09/21/2019  . Bronchiectasis ([place]) 09/21/2019  . Palliative care by specialist   . Goals of care, counseling/discussion   . DNR (do not resuscitate)   . Anemia 09/12/2019  . Physical deconditioning 09/12/2019  . Malnutrition of moderate degree 09/12/2019  . Diarrhea 09/11/2019  . Moderate episode of recurrent major depressive disorder ([place])   . Small vessel cerebrovascular accident (CVA) ([place]) 04/25/2019  . Acute CVA (cerebrovascular accident) ([place]) 04/18/2019  . Weight loss, unintentional 04/18/2019  . Anxiety 04/18/2019  . Major depression in partial remission ([place]) 08/25/2012  . Osteoporosis, unspecified 08/25/2012  . HTN (hypertension) 08/25/2012  . Fall at home 08/24/2012  . Pelvic fracture ([place]) 08/24/2012  . Migraines 08/24/2012  . Renal insufficiency, mild 08/24/2012    Past Medical History:  Past Medical History:  Diagnosis Date  . Closed fracture of coccyx ([place]) 2015   AFTER FALL  . Depression   . GERD (gastroesophageal  reflux disease)   . Headache    MIGRAINES OCCASIONAL  . History of kidney stones    HAS NOW  . Hypertension    Past Surgical History:  Past Surgical History:  Procedure Laterality Date  . ABDOMINAL HYSTERECTOMY     1 OVARY LEFT  . APPENDECTOMY  AGE 4   DONE WITH OVARY REMOVAL  . COLONOSCOPY WITH PROPOFOL N/A 12/03/2015   Procedure: COLONOSCOPY WITH PROPOFOL;  Surgeon: Garlan Fair, MD;  Location: [facility];  Service: Endoscopy;  Laterality: N/A;  . ENDARTERECTOMY Right 04/21/2019   Procedure: ENDARTERECTOMY CAROTID;  Surgeon: Marty Heck, MD;  Location: [facility];  Service: Vascular;  Laterality: Right;  . ESOPHAGOGASTRODUODENOSCOPY (EGD) WITH PROPOFOL N/A 12/03/2015   Procedure: ESOPHAGOGASTRODUODENOSCOPY (EGD) WITH PROPOFOL;  Surgeon: Garlan Fair, MD;  Location: [facility];  Service: Endoscopy;  Laterality: N/A;  . TONSILLECTOMY  AGE 74   AND ADENOIDS REMOVED    Assessment & Plan Clinical Impression:Patricia Stanley is a 74 year old right-handed female history of CVA December 2020 with residual left-sided weakness and received inpatient rehab services 04/25/2019 to 05/10/2019 and maintained on aspirin, hyperlipidemia, chronic normocytic anemia, hypertension, depression, chronic diarrhea, carotid artery stenosis with CEA 04/2019. Per chart review patient lives with spouse. 1 level home 6 steps to entry. Ambulates with a rolling walker but needed some additional support over the past month due to failure to thrive. Presented 09/20/2019 with increased left arm weakness and numbness as well as left leg weakness with associated dyspnea and unintentional weight loss. Cranial CT scan negative for intracranial hemorrhage or territorial infarct. Small focal hypodensity in  the left cerebellum reflecting possible indeterminate lacunar infarction. Patient did not receive TPA. MRI showed 8 mm acute ischemic left basal ganglia infarction. Associated petechial hemorrhage without frank  hemorrhagic transformation. Multiple scattered chronic microhemorrhages involving both cerebral and cerebellar hemispheres most pronounced about the deep gray nuclei. MRI cervical spine degenerative disc osteophyte C4-5 with resultant moderate spinal stenosis. CT angiogram of the chest showed no demonstration of pulmonary emboli. CT angiogram head and neck right carotid enterectomy widely patent. No significant stenosis in the left carotid bifurcation. Negative for intracranial large vessel occlusion. CT abdomen pelvis showed no hydronephrosis, moderate colonic stool burden otherwise unremarkable. Echocardiogram with ejection fraction of 60% no wall motion abnormalities. Noted admission chemistries hemoglobin 7.1 as well as hemoglobin 7.2 09/16/2019, WBC 10.6, chloride 113, BUN 22, urinalysis negative nitrite, troponin 33, SARS coronavirus negative, sedimentation rate 29. Venous Doppler studies negative for DVT. Maintained on aspirin 325 mg daily for CVA prophylaxis. No DAPT due to anemia. Her latest hemoglobin was 7.3. Patient was transfused 1 unit of packed red blood cells. Patient had recently been seen by Dr. Paulita Fujita for gastroenterology services for history of anemia did not feel any work-up was needed as she appeared to be stable hemoglobin over the last couple of years. Patient not felt to be a candidate at this time for endoscopy or colonoscopy. Therapy evaluations completed and patient was admitted for a comprehensive rehab program.  Patient transferred to CIR on 09/27/2019 .    Patient currently requires max with basic self-care skills secondary to muscle weakness and muscle paralysis, decreased cardiorespiratoy endurance, impaired timing and sequencing, abnormal tone, ataxia and decreased coordination, decreased visual acuity, decreased attention to left, decreased problem solving, decreased safety awareness and decreased memory and decreased sitting balance, decreased standing balance, decreased postural  control and decreased balance strategies.  Prior to hospitalization, patient could complete BADLs with min.  Patient will benefit from skilled intervention to increase independence with basic self-care skills prior to discharge home with care partner.  Anticipate patient will require 24 hour supervision and minimal physical assistance and follow up home health.  OT - End of Session Activity Tolerance: Tolerates 10 - 20 min activity with multiple rests Endurance Deficit: Yes Endurance Deficit Description: Requires OT support due to pt with generalized fatigue and deconditioning OT Assessment Rehab Potential (ACUTE ONLY): Good OT Patient demonstrates impairments in the following area(s): Balance;Pain;Perception;Cognition;Safety;Sensory;Endurance;Skin Integrity;Motor;Vision OT Basic ADL's Functional Problem(s): Grooming;Bathing;Dressing;Toileting OT Transfers Functional Problem(s): Toilet;Tub/Shower OT Additional Impairment(s): Fuctional Use of Upper Extremity OT Plan OT Intensity: Minimum of 1-2 x/day, 45 to 90 minutes OT Frequency: 5 out of 7 days OT Duration/Estimated Length of Stay: 12 to 14 days OT Treatment/Interventions: Balance/vestibular training;Discharge planning;Functional electrical stimulation;Therapeutic Activities;Self Care/advanced ADL retraining;Pain management;UE/LE Coordination activities;Cognitive remediation/compensation;Disease mangement/prevention;Functional mobility training;Patient/family education;Therapeutic Exercise;Visual/perceptual remediation/compensation;DME/adaptive equipment instruction;Neuromuscular re-education;Psychosocial support;Splinting/orthotics;UE/LE Strength taining/ROM;Wheelchair propulsion/positioning OT Self Feeding Anticipated Outcome(s): independent OT Basic Self-Care Anticipated Outcome(s): min assist OT Toileting Anticipated Outcome(s): min assist OT Bathroom Transfers Anticipated Outcome(s): min assist OT Recommendation Patient destination:  Home Follow Up Recommendations: Home health OT Equipment Recommended: None recommended by OT Equipment Details: pt has needed DME   Skilled Therapeutic Intervention Pt seen for IE and ADL training this day.  Pt in bed with spouse in room at OT arrival.  Pt very lethargic and needing encouragement from OT and spouse to participate.  Instructed pt through functional transfer training in preparation for sink side self care participation.  Pt required max assist supine to sit and mod assist squat  pivot EOB to w/c.  Pt more alert after sitting up and agreeable to completing oral hygiene, bathing, and dressing at sink.  Due to impaired sensation and significant wrist and hand weakness, pt requiring mod assist to apply toothpaste and position toothbrush in right non-dominant hand.  Pt needing hand over hand assist to support left wrist and hand and facilitate LUE use during doffing of bra and shirt.  Mod assist to stand and max assist to doff brief and pants.  Pt needing assist to bath BUE, back, and bilateral feet.  Pt donned bra, shirt, pull ups, pants, and socks with max assist secondary to LUE/LLE weakness, generalized deconditioning, and impaired sensation.  Educated and collaborated with pt and spouse regarding POC goals, estimated length of stay, and OT scope of practice.  Call bell in reach, spouse in room with pt at departure.  OT Evaluation Precautions/Restrictions  Precautions Precautions: Fall Precaution Comments: L side weakness, global weakness Restrictions Weight Bearing Restrictions: No   Pain Pain Assessment Pain Scale: 0-10 Pain Score: 6  Home Living/Prior Functioning Home Living Family/patient expects to be discharged to:: Private residence Living Arrangements: Spouse/significant other, Children Available Help at Discharge: Family, Available 24 hours/day Type of Home: House Home Access: Stairs to enter [employer] of Steps: 6 Entrance Stairs-Rails: Left,  Right Home Layout: One level Bathroom Shower/Tub: Multimedia programmer: Standard Bathroom Accessibility: Yes Additional Comments: lives with husband and daughter, has tub bench and BSC  Lives With: Spouse, Daughter Prior Function Level of Independence: Needs assistance with ADLs, Needs assistance with gait, Needs assistance with tranfers, Requires assistive device for independence  Able to Take Stairs?: Yes Driving: No Vocation: Retired Comments: Per spouse, pt has needed minimal help with all self care and mobility in last few months ADL ADL Grooming: Moderate assistance Where Assessed-Grooming: Sitting at sink Upper Body Bathing: Maximal assistance, Moderate cueing Where Assessed-Upper Body Bathing: Sitting at sink Lower Body Bathing: Maximal assistance, Moderate cueing Where Assessed-Lower Body Bathing: Sitting at sink Upper Body Dressing: Maximal assistance, Moderate cueing Where Assessed-Upper Body Dressing: Sitting at sink Lower Body Dressing: Maximal assistance Where Assessed-Lower Body Dressing: Sitting at sink, Standing at sink Toileting: Moderate assistance Where Assessed-Toileting: Bedside Commode Toilet Transfer: Moderate assistance Toilet Transfer Method: Squat pivot Toilet Transfer Equipment: Drop arm bedside commode Tub/Shower Transfer: Not assessed Vision Baseline Vision/History: Wears glasses Wears Glasses: Reading only Patient Visual Report: Blurring of vision(pt reports cant see as well in left eye) Vision Assessment?: Yes Eye Alignment: Within Functional Limits Ocular Range of Motion: Within Functional Limits Tracking/Visual Pursuits: Able to track stimulus in all quads without difficulty Visual Fields: No apparent deficits Perception  Perception: Impaired Inattention/Neglect: Does not attend to left side of body Praxis Praxis: Intact Cognition Overall Cognitive Status: History of cognitive impairments - at baseline Arousal/Alertness:  Awake/alert Orientation Level: Person;Place Year: Other (Comment)(pt stated "45") Month: May Day of Week: Correct Memory: Impaired Memory Impairment: Retrieval deficit;Storage deficit;Decreased recall of new information Immediate Memory Recall: Sock Memory Recall Sock: With Cue Memory Recall Blue: Without Cue Memory Recall Bed: With Cue Attention: Focused;Sustained Focused Attention: Impaired Focused Attention Impairment: Functional complex;Verbal complex Sustained Attention: Appears intact Awareness: Impaired Awareness Impairment: Intellectual impairment;Emergent impairment;Anticipatory impairment Problem Solving: Impaired Problem Solving Impairment: Verbal basic;Verbal complex;Functional basic;Functional complex Executive Function: Sequencing;Reasoning;Decision Making;Initiating;Self Monitoring;Self Correcting Reasoning: Impaired Reasoning Impairment: Verbal basic;Verbal complex;Functional basic;Functional complex Sequencing: Appears intact Decision Making: Impaired Decision Making Impairment: Verbal basic;Verbal complex;Functional basic;Functional complex Initiating: Appears intact Self Monitoring: Impaired Self Monitoring Impairment:  Verbal basic;Verbal complex;Functional basic;Functional complex Self Correcting: Impaired Self Correcting Impairment: Verbal basic;Verbal complex;Functional basic;Functional complex Safety/Judgment: Impaired Sensation Sensation Light Touch: Impaired by gross assessment Central sensation comments: impaired light touch L UE/LE - diminished compared to R side Light Touch Impaired Details: Impaired LUE;Impaired LLE Hot/Cold: Appears Intact Proprioception: Impaired by gross assessment Stereognosis: Impaired by gross assessment Coordination Gross Motor Movements are Fluid and Coordinated: No Fine Motor Movements are Fluid and Coordinated: No Coordination and Movement Description: coordination impaired secondary to tone and hemiparesis, ataxia of  LUE and LE noted Motor  Motor Motor: Abnormal tone;Hemiplegia;Ataxia Motor - Skilled Clinical Observations: L hemiparesis, tone and LUE/LE ataxia Mobility  Transfers Sit to Stand: Moderate Assistance - Patient 50-74% Stand to Sit: Moderate Assistance - Patient 50-74%  Trunk/Postural Assessment  Cervical Assessment Cervical Assessment: Exceptions to [facility] Postural Control Postural Control: Deficits on evaluation Postural Limitations: posterior lean in sitting when fatigued  Balance Static Sitting Balance Static Sitting - Level of Assistance: 4: Min assist Dynamic Sitting Balance Dynamic Sitting - Level of Assistance: 3: Mod assist Static Standing Balance Static Standing - Level of Assistance: 3: Mod assist Dynamic Standing Balance Dynamic Standing - Level of Assistance: 2: Max assist Extremity/Trunk Assessment RUE Assessment RUE Assessment: Within Functional Limits LUE Assessment LUE Assessment: Exceptions to [facility] Passive Range of Motion (PROM) Comments: PROM BUE WFL Active Range of Motion (AROM) Comments: sh flex 110; wrist ext 0 General Strength Comments: shoulder 3-/5 ; elbow flex/ext 3+/5; forearm 3/5; wrist ext/flex 0/5 ; hand flex: 2-/5 ext 0/5 LUE Body System: Neuro Brunstrum levels for arm and hand: Arm;Hand Brunstrum level for arm: Stage IV Movement is deviating from synergy Brunstrum level for hand: Stage II Synergy is developing LUE Tone LUE Tone: Hypotonic;Mild     Refer to Care Plan for Long Term Goals  Recommendations for other services: None    Discharge Criteria: Patient will be discharged from OT if patient refuses treatment 3 consecutive times without medical reason, if treatment goals not met, if there is a change in medical status, if patient makes no progress towards goals or if patient is discharged from hospital.  The above assessment, treatment plan, treatment alternatives and goals were discussed and mutually agreed upon: by patient and by  family  Ezekiel Slocumb 09/28/2019, 3:41 PM

## 2019-09-29 ENCOUNTER — Inpatient Hospital Stay (HOSPITAL_COMMUNITY): Payer: Medicare HMO | Admitting: Occupational Therapy

## 2019-09-29 ENCOUNTER — Inpatient Hospital Stay (HOSPITAL_COMMUNITY): Payer: Medicare HMO | Admitting: Physical Therapy

## 2019-09-29 ENCOUNTER — Inpatient Hospital Stay (HOSPITAL_COMMUNITY): Payer: Medicare HMO | Admitting: Speech Pathology

## 2019-09-29 DIAGNOSIS — K5900 Constipation, unspecified: Secondary | ICD-10-CM

## 2019-09-29 DIAGNOSIS — K5903 Drug induced constipation: Secondary | ICD-10-CM

## 2019-09-29 LAB — COMPREHENSIVE METABOLIC PANEL
ALT: 27 U/L (ref 0–44)
AST: 42 U/L — ABNORMAL HIGH (ref 15–41)
Albumin: 1.5 g/dL — ABNORMAL LOW (ref 3.5–5.0)
Alkaline Phosphatase: 172 U/L — ABNORMAL HIGH (ref 38–126)
Anion gap: 8 (ref 5–15)
BUN: 39 mg/dL — ABNORMAL HIGH (ref 8–23)
CO2: 27 mmol/L (ref 22–32)
Calcium: 8.4 mg/dL — ABNORMAL LOW (ref 8.9–10.3)
Chloride: 104 mmol/L (ref 98–111)
Creatinine, Ser: 1.6 mg/dL — ABNORMAL HIGH (ref 0.44–1.00)
GFR calc Af Amer: 37 mL/min — ABNORMAL LOW (ref >60)
GFR calc non Af Amer: 32 mL/min — ABNORMAL LOW (ref >60)
Glucose, Bld: 114 mg/dL — ABNORMAL HIGH (ref 70–99)
Potassium: 3.8 mmol/L (ref 3.5–5.1)
Sodium: 139 mmol/L (ref 135–145)
Total Bilirubin: 0.8 mg/dL (ref 0.3–1.2)
Total Protein: 5.3 g/dL — ABNORMAL LOW (ref 6.5–8.1)

## 2019-09-29 MED ORDER — LISINOPRIL 5 MG PO TABS
2.5000 mg | ORAL_TABLET | Freq: Every day | ORAL | Status: DC
  Administered 2019-09-29 – 2019-10-01 (×3): 2.5 mg via ORAL
  Filled 2019-09-29 (×3): qty 1

## 2019-09-29 NOTE — Progress Notes (Signed)
Occupational Therapy Session Note  Patient Details  Name: Patricia Stanley MRN: [id number] Date of Birth: March 15, 1946  Today's Date: 09/29/2019 OT Individual Time: 1105-1205 OT Individual Time Calculation (min): 60 min    Short Term Goals: Week 1:  OT Short Term Goal 1 (Week 1): Pt donn/doff shirt and bra with mod assist. OT Short Term Goal 2 (Week 1): Pt will complete oral hygiene sitting sinkside and using hemitechnique with supervision. OT Short Term Goal 3 (Week 1): Pt will complete BSC transfer with min assist. OT Short Term Goal 4 (Week 1): Pt will bathe UB/LB with mod assist using long handled sponge.  Skilled Therapeutic Interventions/Progress Updates:  Pt asleep in bed with husband present for entire session.  Vitals taken at rest: SPO2 on 2L via [place]: 98% and pulse 102 bpm.  Pt wanting to wash up at sink.  Max assist supine to sit and squat pivot transfer EOB to w/c. Pt educated through Electronics engineer and didactic instruction on hemitechnique to open toothpaste and apply on toothbrush with right nondominant hand. Pt exhibiting increased processing time needed to respond to questions and initiation of tasks throughout.  Pt washed UB with mod assist for back and hand over hand assist to wash RUE with left hand.  Pt washed upper BLE and min assist sit<>stand to for dependent washing of buttocks.  Pt educated on widening BOS to improve standing balance.  Pt return demonstrated BOS positioning while standing to use mouthwash with VCs and min assist.  Educated pt on compensatory technique to donn bra and shirt, pullups, and pants leading with left side requiring max assist.  Educated pt and spouse on proper donning/doffing of prefab wrist cock up splint and reviewed precautions and wearing schedule. Pt exhibiting memory deficits requiring repetition with skilled training to facilitate carryover and follow through during self care tasks.  Pt sitting up in w/c, Call bell and needs in reach, husband in  room.       Therapy Documentation Precautions:  Precautions Precautions: Fall Precaution Comments: L side weakness, global weakness Restrictions Weight Bearing Restrictions: No Pain: Pain Assessment Pain Scale: 0-10 Faces Pain Scale: No hurt ADL: ADL Grooming: Moderate assistance Where Assessed-Grooming: Sitting at sink Upper Body Bathing: Maximal assistance, Moderate cueing Where Assessed-Upper Body Bathing: Sitting at sink Lower Body Bathing: Maximal assistance, Moderate cueing Where Assessed-Lower Body Bathing: Sitting at sink Upper Body Dressing: Maximal assistance, Moderate cueing Where Assessed-Upper Body Dressing: Sitting at sink Lower Body Dressing: Maximal assistance Where Assessed-Lower Body Dressing: Sitting at sink, Standing at sink Toileting: Moderate assistance Where Assessed-Toileting: Bedside Commode Toilet Transfer: Moderate assistance Toilet Transfer Method: Squat pivot Toilet Transfer Equipment: Drop arm bedside commode Tub/Shower Transfer: Not assessed   Therapy/Group: Individual Therapy  Amie Critchley 09/29/2019, 4:14 PM

## 2019-09-29 NOTE — Progress Notes (Signed)
Speech Language Pathology Daily Session Note  Patient Details  Name: Patricia Stanley MRN: [id number] Date of Birth: 1945-07-17  Today's Date: 09/29/2019 SLP Individual Time: 2952-8413 SLP Individual Time Calculation (min): 55 min  Short Term Goals: Week 1: SLP Short Term Goal 1 (Week 1): Pt will demonstrated sustained attention in functional tasks in 10 minute intervals with min A verbal cues for redirection. SLP Short Term Goal 2 (Week 1): Pt will demonstrate recall of novel and daily information with use of visual aid (memory notebook) given min A verbal cues. SLP Short Term Goal 3 (Week 1): Pt will complete basic problem solving tasks with min A verbal cues. SLP Short Term Goal 4 (Week 1): Pt will self-monitor and self-correct functional errors during basic problem solving tasks with min A verbal cues.  Skilled Therapeutic Interventions: Pt was seen for skilled ST targeting cognition. SLP facilitated session with introduction of memory notebook to assist with carryover of new and daily information. Pt required overall Max A to recall therapy team and disciplines, Mod A to recall course of hospitalization. Pt expressed need to have a bowel movement during session. With assistance of NT for safe transfers, SLP provided Min A verbal cues for recall of safety precautions and techniques for body positioning during transfers. Overall Min A verbal cues provided for sustained attention for 10 minute intervals throughout session. Pt left laying in bed with alarm set and needs within reach. Continue per current plan of care.        Pain Pain Assessment Pain Scale: Faces Faces Pain Scale: No hurt  Therapy/Group: Individual Therapy  Little Ishikawa 09/29/2019, 7:01 AM

## 2019-09-29 NOTE — Progress Notes (Signed)
Physical Therapy Session Note  Patient Details  Name: Patricia Stanley MRN: [id number] Date of Birth: 1946/04/27  Today's Date: 09/29/2019 PT Individual Time: 1300-1410 PT Individual Time Calculation (min): 70 min   Short Term Goals: Week 1:  PT Short Term Goal 1 (Week 1): Pt will perform bed<>chair transfer with min assist PT Short Term Goal 2 (Week 1): Pt will ascend/descned 4 steps with B rails and mod assist PT Short Term Goal 3 (Week 1): PT will ambulate >20 ft with LRAD and mod assist  Skilled Therapeutic Interventions/Progress Updates:    Patient received up in [facility] reporting she had a very rough morning and is very tired today but willing to participate in session with coaxing from PT/encouragement from husband. Able to complete functional transfers with Min-ModA and RW with Mod cues for safety and sequencing, also upright posture and worked on L LE strengthening and motor control activities in supine; able to perform bed mobility with ModA but very dizzy upon supine to sit and needed extended time to recover. Did perform basic vestibular assessment and identified likely central origin vestibular symptoms as evidenced by lags in saccades and eye tracking dynamic targets. Will need to further assess orthostatics. Able to gait train approximately 12f with ModA however with narrow BOS and scissoring pattern with moderate buckling L LE and eventually required MaxA after LOB with RW to prevent fall. SpO2 100% on 2LPM during session. Performed multiple static stands with RW and ModA to come to upright, MinA/Min cues to maintain true upright balance. Left in bed positioned to comfort with all needs met, bed alarm active and husband present.   Therapy Documentation Precautions:  Precautions Precautions: Fall Precaution Comments: L side weakness, global weakness Restrictions Weight Bearing Restrictions: No Pain: Pain Assessment Pain Scale: 0-10 Faces Pain Scale: No hurt    Therapy/Group:  Individual Therapy   KWindell Norfolk DPT, PN1   Supplemental Physical Therapist [place]   Pager [phone] Rehab Office [phone]   09/29/2019, 3:33 PM

## 2019-09-29 NOTE — Progress Notes (Signed)
Orthopedic Tech Progress Note Patient Details:  EMMI WERTHEIM May 10, 1945 [id number] Called in brace for patient. Patient ID: Patricia Stanley, female   DOB: 03-Feb-1946, 74 y.o.   MRN: [id number]   Michelle Piper 09/29/2019, 11:27 AM

## 2019-09-29 NOTE — Progress Notes (Signed)
[place] PHYSICAL MEDICINE & REHABILITATION PROGRESS NOTE  Subjective/Complaints: Patient seen sitting up in bed this AM.  She states she slept well overnight.  She notes she had a tiring first day of therapies yesterday.  Her affect is brighter.   ROS: +SOB, nausea. Denies CP, V/D  Objective: Vital Signs: Blood pressure (!) 173/77, pulse 99, temperature 98.7 F (37.1 C), resp. rate 16, height 5\' 1"  (1.549 m), weight 39.1 kg, SpO2 98 %. DG Abd Portable 1V  Result Date: 09/28/2019 CLINICAL DATA:  Constipation, history kidney stones, hysterectomy EXAM: PORTABLE ABDOMEN - 1 VIEW COMPARISON:  At 09/21/2019 FINDINGS: Increased stool throughout colon especially mid transverse colon through rectum. No bowel dilatation or bowel wall thickening. Bones demineralized. Questionable LEFT renal calculus 5 mm diameter. IMPRESSION: Significantly increased stool in the distal half of the colon and rectum. Electronically Signed   By: Lavonia Dana M.D.   On: 09/28/2019 13:04   Recent Labs    09/27/19 0547 09/28/19 0542  WBC 14.3* 13.3*  HGB 7.8* 7.5*  HCT 24.7* 23.5*  PLT 219 312   Recent Labs    09/28/19 0542 09/29/19 0625  NA 137 139  K 4.0 3.8  CL 102 104  CO2 27 27  GLUCOSE 112* 114*  BUN 33* 39*  CREATININE 1.35* 1.60*  CALCIUM 8.5* 8.4*    Physical Exam: BP (!) 173/77 (BP Location: Right Arm)   Pulse 99   Temp 98.7 F (37.1 C)   Resp 16   Ht 5\' 1"  (1.549 m)   Wt 39.1 kg   SpO2 98%   BMI 16.29 kg/m   Constitutional: No distress . Vital signs reviewed. Cachectic. HENT: Normocephalic.  Atraumatic. Eyes: EOMI. No discharge. Cardiovascular: No JVD. Respiratory: Normal effort.  No stridor. +[place]. GI: Non-distended. Skin: Warm and dry.  Intact. Psych: Normal mood.  Normal behavior. Musc: No edema in extremities.  No tenderness in extremities. Neuro: Alert HOH Motor: RUE 4+/5 proximal distal LUE: Shoulder abduction, elbow flexion/extension 4 -/5, distally 0/5, unchanged Right  lower extremity: 4+/5 proximal distal Left lower extremity: Hip flexion, knee extension 4/5, ankle dorsiflexion 0/5, unchanged  Assessment/Plan: 1. Functional deficits secondary to multiple bilateral infarcts, largest on left with ? Radiculopathy which require 3+ hours per day of interdisciplinary therapy in a comprehensive inpatient rehab setting.  Physiatrist is providing close team supervision and 24 hour management of active medical problems listed below.  Physiatrist and rehab team continue to assess barriers to discharge/monitor patient progress toward functional and medical goals  Care Tool:  Bathing    Body parts bathed by patient: Chest, Abdomen, Front perineal area, Right upper leg, Left upper leg, Face   Body parts bathed by helper: Right lower leg, Left lower leg, Right arm, Left arm Body parts n/a: Buttocks   Bathing assist Assist Level: Maximal Assistance - Patient 24 - 49%     Upper Body Dressing/Undressing Upper body dressing   What is the patient wearing?: Pull over shirt, Bra    Upper body assist Assist Level: Maximal Assistance - Patient 25 - 49%    Lower Body Dressing/Undressing Lower body dressing      What is the patient wearing?: Incontinence brief     Lower body assist Assist for lower body dressing: Total Assistance - Patient < 25%     Toileting Toileting    Toileting assist Assist for toileting: Total Assistance - Patient < 25%     Transfers Chair/bed transfer  Transfers assist  Chair/bed transfer activity  did not occur: Safety/medical concerns  Chair/bed transfer assist level: Moderate Assistance - Patient 50 - 74%     Locomotion Ambulation   Ambulation assist      Assist level: Moderate Assistance - Patient 50 - 74% Assistive device: Walker-rolling Max distance: 10 ft   Walk 10 feet activity   Assist     Assist level: Moderate Assistance - Patient - 50 - 74% Assistive device: Walker-rolling   Walk 50 feet  activity   Assist Walk 50 feet with 2 turns activity did not occur: Safety/medical concerns         Walk 150 feet activity   Assist Walk 150 feet activity did not occur: Safety/medical concerns         Walk 10 feet on uneven surface  activity   Assist Walk 10 feet on uneven surfaces activity did not occur: Safety/medical concerns         Wheelchair     Assist Will patient use wheelchair at discharge?: No             Wheelchair 50 feet with 2 turns activity    Assist            Wheelchair 150 feet activity     Assist            Medical Problem List and Plan: 1.  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  Left C5 cervical radiculopathy/spinal stenosis  Continue CIR   WHO/PRAFO ordered 2.  Antithrombotics: -DVT/anticoagulation: SCDs.             -antiplatelet therapy: Aspirin 325 mg daily 3. Pain Management: Tramadol as needed 4. Mood: Klonopin 0.5 mg twice daily, Remeron 50 mg nightly             -antipsychotic agents: N/A 5. Neuropsych: This patient is capable of making decisions on her own behalf. 6. Skin/Wound Care: Routine skin checks 7. Fluids/Electrolytes/Nutrition: Routine in and outs 8.  Chronic normocytic anemia.    Continue iron supplement.  Followed outpatient by GI services.  Not a candidate for colonoscopy or endoscopy  Hemoglobin 7.5 on 5/26, labs ordered for tomorrow  Continue to monitor 9.  Chronic diarrhea  Cont Creon.  Followed outpatient by GI services.   Started Miralax daily   Lactulose prn  KUB personally reviewed, showing constipation 10.  Unintentional weight loss.  CT abdomen pelvis unremarkable.  Dietary follow-up  Daily weights Filed Weights   09/27/19 1525 09/29/19 0500  Weight: 41.8 kg 39.1 kg   11.  Hyperlipidemia.   Lipitor 12.  GERD.  Protonix 13.  Supplemental oxygen dependent   Wean as tolerated 14.  Hypertension  Lisinopril 2.5 started on 5/27  Monitor with increased mobility 15.  Prediabetes  Labs ordered for tomorrow  Monitor with increased mobility 16.  Leukocytosis  WBC 13.3 on 5/26, labs ordered for tomorrow  Afebrile  Continue to monitor  17.  AKI  Creatinine 1.60 on 5/27, labs ordered for tomorrow, will consider IVF  Encourage fluids  Continue to monitor 18.  Severe hypoalbuminemia with malnutrition  Supplement increased on 5/26 19.  Transaminitis  AST elevated, but improving on 5/27  Continue monitor  LOS: 2 days A FACE TO FACE EVALUATION WAS PERFORMED  Alexander Mcauley Karis Juba 09/29/2019, 11:02 AM

## 2019-09-30 ENCOUNTER — Inpatient Hospital Stay (HOSPITAL_COMMUNITY): Payer: Medicare HMO | Admitting: Occupational Therapy

## 2019-09-30 ENCOUNTER — Inpatient Hospital Stay (HOSPITAL_COMMUNITY): Payer: Medicare HMO | Admitting: Physical Therapy

## 2019-09-30 LAB — BASIC METABOLIC PANEL
Anion gap: 6 (ref 5–15)
BUN: 35 mg/dL — ABNORMAL HIGH (ref 8–23)
CO2: 27 mmol/L (ref 22–32)
Calcium: 8.3 mg/dL — ABNORMAL LOW (ref 8.9–10.3)
Chloride: 105 mmol/L (ref 98–111)
Creatinine, Ser: 1.49 mg/dL — ABNORMAL HIGH (ref 0.44–1.00)
GFR calc Af Amer: 40 mL/min — ABNORMAL LOW (ref >60)
GFR calc non Af Amer: 34 mL/min — ABNORMAL LOW (ref >60)
Glucose, Bld: 97 mg/dL (ref 70–99)
Potassium: 3.6 mmol/L (ref 3.5–5.1)
Sodium: 138 mmol/L (ref 135–145)

## 2019-09-30 LAB — CBC WITH DIFFERENTIAL/PLATELET
Abs Immature Granulocytes: 0.11 10*3/uL — ABNORMAL HIGH (ref 0.00–0.07)
Basophils Absolute: 0.1 10*3/uL (ref 0.0–0.1)
Basophils Relative: 1 %
Eosinophils Absolute: 0.6 10*3/uL — ABNORMAL HIGH (ref 0.0–0.5)
Eosinophils Relative: 5 %
HCT: 23.9 % — ABNORMAL LOW (ref 36.0–46.0)
Hemoglobin: 7.4 g/dL — ABNORMAL LOW (ref 12.0–15.0)
Immature Granulocytes: 1 %
Lymphocytes Relative: 7 %
Lymphs Abs: 0.9 10*3/uL (ref 0.7–4.0)
MCH: 28.4 pg (ref 26.0–34.0)
MCHC: 31 g/dL (ref 30.0–36.0)
MCV: 91.6 fL (ref 80.0–100.0)
Monocytes Absolute: 0.8 10*3/uL (ref 0.1–1.0)
Monocytes Relative: 6 %
Neutro Abs: 11.3 10*3/uL — ABNORMAL HIGH (ref 1.7–7.7)
Neutrophils Relative %: 80 %
Platelets: 278 10*3/uL (ref 150–400)
RBC: 2.61 MIL/uL — ABNORMAL LOW (ref 3.87–5.11)
RDW: 20.8 % — ABNORMAL HIGH (ref 11.5–15.5)
WBC: 13.8 10*3/uL — ABNORMAL HIGH (ref 4.0–10.5)
nRBC: 0 % (ref 0.0–0.2)

## 2019-09-30 MED ORDER — GABAPENTIN 100 MG PO CAPS
100.0000 mg | ORAL_CAPSULE | Freq: Two times a day (BID) | ORAL | Status: DC
  Administered 2019-09-30 (×2): 100 mg via ORAL
  Filled 2019-09-30 (×2): qty 1

## 2019-09-30 NOTE — Progress Notes (Signed)
Occupational Therapy Session Note  Patient Details  Name: Patricia Stanley MRN: [id number] Date of Birth: Mar 01, 1946  Today's Date: 09/30/2019 OT Individual Time: 1103-1205 OT Individual Time Calculation (min): 62 min    Short Term Goals: Week 1:  OT Short Term Goal 1 (Week 1): Pt donn/doff shirt and bra with mod assist. OT Short Term Goal 2 (Week 1): Pt will complete oral hygiene sitting sinkside and using hemitechnique with supervision. OT Short Term Goal 3 (Week 1): Pt will complete BSC transfer with min assist. OT Short Term Goal 4 (Week 1): Pt will bathe UB/LB with mod assist using long handled sponge.  Skilled Therapeutic Interventions/Progress Updates:   Pt up in w/c with husband present.  "My feet and ankles hurt me last night"  Pt reports feeling tired and does not want to do a lot of standing today but agreeable to participate throughout session.  Pt doffed shirt and bra over head with max VCs and max assist needed to implement compensatory strategy of leading with RUE.  Pt's husband reports he doesn't think therapy needs to work on dressing and self care tasks because he and his dtr can help with these things, and wants [place] to work on getting her stronger.  Educated pts husband on compensatory dressing technique to facilitate increased ease during assisting pt.  Husband recalled technique accurately.   Toilet transfer completed with min assist w/c to toilet, max assist to pull pants up and down, and mod assist toilet to w/c. Vitals taken throughout session and noted SPO2 on 2L via [place] 96-99% and pulse: 104-113 bpm. Pt with limited anterior weight shifting during transfer needing further training to increase independence.  Educated pt on benefits of and on correct cervical and thoracic posture due to forward protruded head and forward rounding of shoulders.  Practiced scapular retraction and shoulder depression with TCs to recruit musculature 3 x 10 reps.  LUE strengthening completed with pt  including shoulder scaption with cues to keep elbow extended and manual min resistive biceps/triceps 3 x 10 reps each.  Pt left in w/c with husband reporting he will be with pt until next therapy session today.  Call bell in reach.  Therapy Documentation Precautions:  Precautions Precautions: Fall Precaution Comments: L side weakness, global weakness Restrictions Weight Bearing Restrictions: No  Pain: Pain Assessment Pain Scale: 0-10 Pain Score: 5  Pain Type: Acute pain Pain Location: Leg Pain Orientation: Right;Left Pain Descriptors / Indicators: Tingling;Sore Pain Onset: On-going Patients Stated Pain Goal: 0 Pain Intervention(s): Repositioned;Distraction Multiple Pain Sites: No   ADL: ADL Grooming: Moderate assistance Where Assessed-Grooming: Sitting at sink Upper Body Bathing: Maximal assistance, Moderate cueing Where Assessed-Upper Body Bathing: Sitting at sink Lower Body Bathing: Maximal assistance, Moderate cueing Where Assessed-Lower Body Bathing: Sitting at sink Upper Body Dressing: Maximal assistance, Moderate cueing Where Assessed-Upper Body Dressing: Sitting at sink Lower Body Dressing: Maximal assistance Where Assessed-Lower Body Dressing: Sitting at sink, Standing at sink Toileting: Moderate assistance Where Assessed-Toileting: Bedside Commode Toilet Transfer: Moderate assistance Toilet Transfer Method: Squat pivot Toilet Transfer Equipment: Drop arm bedside commode Tub/Shower Transfer: Not assessed     Therapy/Group: Individual Therapy  Amie Critchley 09/30/2019, 12:45 PM

## 2019-09-30 NOTE — Care Management (Signed)
Patient ID: Patricia Stanley, female   DOB: Nov 11, 1945, 74 y.o.   MRN: [id number]   Met with the patient to review secondary stroke prevention and management of HTN, kidney stone prevention. (Hx of kidney stone; present on admission). Patient given handouts for DASH diet and dietary recommendations to prevent kidney stones. Reviewed correlation between diuretics administered to control HTN and kidney stones as well as food to eat that will supplement potassium often depleted with diuretic medications. Patient has the patient handbook on HTN management to refer to and requested remin. Stated an understanding of information reviewed; review with spouse when he comes in later.

## 2019-09-30 NOTE — Progress Notes (Signed)
Patient ID: Patricia Stanley, female   DOB: 20-Nov-1945, 74 y.o.   MRN: [id number]  Sw followed up with family/patient for any questions/concerns we transition into weekend, no concerns

## 2019-09-30 NOTE — Progress Notes (Signed)
Occupational Therapy Session Note  Patient Details  Name: Patricia Stanley MRN: [id number] Date of Birth: [birth year]  Today's Date: 09/30/2019 OT Individual Time: 1300-1330 OT Individual Time Calculation (min): 30 min    Short Term Goals: Week 1:  OT Short Term Goal 1 (Week 1): Pt donn/doff shirt and bra with mod assist. OT Short Term Goal 2 (Week 1): Pt will complete oral hygiene sitting sinkside and using hemitechnique with supervision. OT Short Term Goal 3 (Week 1): Pt will complete BSC transfer with min assist. OT Short Term Goal 4 (Week 1): Pt will bathe UB/LB with mod assist using long handled sponge.  Skilled Therapeutic Interventions/Progress Updates:    Pt received in w/c agreeable to therapy despite feeling tired.  Focus of therapy on wrist extension.    Placed saebo stim one estim on L forearm to facilitate wrist extension but had difficulty isolating her wrist. Used estim for 15 minutes with assisted ROM.  Next session, smaller pads that are available with Empi device may be more effective as she has very small bone structure.    Worked on a/arom of wrist in gravity eliminated position with pt working on grasping a lotion bottle. A/arom of grasp and release with towel squeezes.  Pt tolerated therapy well and resting in wc for next session.   Therapy Documentation Precautions:  Precautions Precautions: Fall Precaution Comments: L side weakness, global weakness Restrictions Weight Bearing Restrictions: No    Vital Signs: Therapy Vitals Temp: 98.6 F (37 C) Pulse Rate: (!) 106 Resp: 18 BP: (!) 152/87 Patient Position (if appropriate): Sitting Oxygen Therapy SpO2: 100 % O2 Device: Nasal Cannula O2 Flow Rate (L/min): 2 L/min Pain: Pain Assessment Pain Scale: 0-10 Pain Score: 5  Pain Type: Acute pain Pain Location: Leg Pain Orientation: Right;Left Pain Descriptors / Indicators: Tingling;Sore Pain Onset: On-going Patients Stated Pain Goal: 0 Pain  Intervention(s): Repositioned;Distraction Multiple Pain Sites: No    Therapy/Group: Individual Therapy  Delorice Bannister 09/30/2019, 1:48 PM

## 2019-09-30 NOTE — Progress Notes (Signed)
Physical Therapy Session Note  Patient Details  Name: Patricia Stanley MRN: [id number] Date of Birth: 08/02/1945  Today's Date: 09/30/2019 PT Individual Time: 0900-0956 PT Individual Time Calculation (min): 56 min   Short Term Goals: Week 1:  PT Short Term Goal 1 (Week 1): Pt will perform bed<>chair transfer with min assist PT Short Term Goal 2 (Week 1): Pt will ascend/descned 4 steps with B rails and mod assist PT Short Term Goal 3 (Week 1): PT will ambulate >20 ft with LRAD and mod assist  Skilled Therapeutic Interventions/Progress Updates:    patient received in bed attempting to refuse therapy today, stating "I'm too weak!". Education provided on weakness after CVA and benefits of therapy especially in CIR setting to improve functional strength and mobility, she then attempted to refuse stating "well I haven't eaten breakfast!". Assisted her in eating breakfast, did note difficulty in managing fork with R UE so did help with feeding for time management however she did intermittently demonstrate ability to use R hand to manage fork today. Completely soiled in urine. Rolled side to side with MinA and needed total assist for removing soiled brief/donning new brief/paper scrub pants. Able to get to EOB and into WC with ModA, she then declined to do any more from here due to BLE pain, RN aware. Left up in [facility] with seatbelt alarm active, all needs otherwise met this morning.   Therapy Documentation Precautions:  Precautions Precautions: Fall Precaution Comments: L side weakness, global weakness Restrictions Weight Bearing Restrictions: No   Pain: Pain Assessment Pain Scale: 0-10 Pain Score: 5  Pain Type: Acute pain Pain Location: Leg Pain Orientation: Right;Left Pain Descriptors / Indicators: Tingling;Sore Pain Onset: On-going Patients Stated Pain Goal: 0 Pain Intervention(s): Repositioned;Distraction Multiple Pain Sites: No    Therapy/Group: Individual Therapy   Windell Norfolk,  DPT, PN1   Supplemental Physical Therapist [place]    Pager [phone] Acute Rehab Office [phone]    09/30/2019, 12:23 PM

## 2019-09-30 NOTE — Progress Notes (Addendum)
Occupational Therapy Session Note  Patient Details  Name: Patricia Stanley MRN: [id number] Date of Birth: [birth year]  Today's Date: 09/30/2019 OT Individual Time: 1300-1330 OT Individual Time Calculation (min): 30 min    Short Term Goals: Week 1:  OT Short Term Goal 1 (Week 1): Pt donn/doff shirt and bra with mod assist. OT Short Term Goal 2 (Week 1): Pt will complete oral hygiene sitting sinkside and using hemitechnique with supervision. OT Short Term Goal 3 (Week 1): Pt will complete BSC transfer with min assist. OT Short Term Goal 4 (Week 1): Pt will bathe UB/LB with mod assist using long handled sponge.  Skilled Therapeutic Interventions/Progress Updates:  Pt reports she has been working on her postural exercises and demonstrated to OT with good carryover of skills.  Trained pt in forward weight shifting during stand pivot transfers in gym to facilitate proper body mechanics.  Pt completed stand pivot with minimal weight shifting needing mod assist.  Worked on seated sitting balance, static and dynamic, during grasp and place card matching in multiplanes using hand over hand on right.   Pt needing intermittent VCs to follow through good posture techniques during activity. Pt requesting to use toilet and get back to bed at end of session.  Max assist for clothing management in standing, setup for front pericare in sitting on BSC. Pt demonstrated improved forward weight shifting during stand pivot w/c<>BSC, w/c to EOB with min to mod assist.  TCs given to facilitate side to side weight shifting once in standing.  Pt supine in bed with Bed alarm on, call bell in reach.     Therapy Documentation Precautions:  Precautions Precautions: Fall Precaution Comments: L side weakness, global weakness Restrictions Weight Bearing Restrictions: No    Pain: Pain Assessment Pain Scale: 0-10 Pain Score: 5  Pain Type: Acute pain Pain Location: Leg Pain Orientation: Right;Left Pain Descriptors /  Indicators: Tingling;Sore Pain Onset: On-going Patients Stated Pain Goal: 0 Pain Intervention(s): Repositioned;Distraction Multiple Pain Sites: No ADL: ADL Grooming: Moderate assistance Where Assessed-Grooming: Sitting at sink Upper Body Bathing: Maximal assistance, Moderate cueing Where Assessed-Upper Body Bathing: Sitting at sink Lower Body Bathing: Maximal assistance, Moderate cueing Where Assessed-Lower Body Bathing: Sitting at sink Upper Body Dressing: Maximal assistance, Moderate cueing Where Assessed-Upper Body Dressing: Sitting at sink Lower Body Dressing: Maximal assistance Where Assessed-Lower Body Dressing: Sitting at sink, Standing at sink Toileting: Moderate assistance Where Assessed-Toileting: Bedside Commode Toilet Transfer: Moderate assistance Toilet Transfer Method: Squat pivot Toilet Transfer Equipment: Drop arm bedside commode Tub/Shower Transfer: Not assessed      Therapy/Group: Individual Therapy  Amie Critchley 09/30/2019, 3:13 PM

## 2019-09-30 NOTE — Progress Notes (Signed)
[place] PHYSICAL MEDICINE & REHABILITATION PROGRESS NOTE  Subjective/Complaints: Patient seen laying in bed this AM.  She states she slept fairly overnight due to pain that was relieved with meds.   ROS: +SOB. Denies CP, N/V/D  Objective: Vital Signs: Blood pressure (!) 168/72, pulse 96, temperature 97.7 F (36.5 C), resp. rate 16, height 5\' 1"  (1.549 m), weight 41.9 kg, SpO2 99 %. DG Abd Portable 1V  Result Date: 09/28/2019 CLINICAL DATA:  Constipation, history kidney stones, hysterectomy EXAM: PORTABLE ABDOMEN - 1 VIEW COMPARISON:  At 09/21/2019 FINDINGS: Increased stool throughout colon especially mid transverse colon through rectum. No bowel dilatation or bowel wall thickening. Bones demineralized. Questionable LEFT renal calculus 5 mm diameter. IMPRESSION: Significantly increased stool in the distal half of the colon and rectum. Electronically Signed   By: 09/23/2019 M.D.   On: 09/28/2019 13:04   Recent Labs    09/28/19 0542 09/30/19 0636  WBC 13.3* 13.8*  HGB 7.5* 7.4*  HCT 23.5* 23.9*  PLT 312 278   Recent Labs    09/29/19 0625 09/30/19 0636  NA 139 138  K 3.8 3.6  CL 104 105  CO2 27 27  GLUCOSE 114* 97  BUN 39* 35*  CREATININE 1.60* 1.49*  CALCIUM 8.4* 8.3*    Physical Exam: BP (!) 168/72 (BP Location: Right Leg)   Pulse 96   Temp 97.7 F (36.5 C)   Resp 16   Ht 5\' 1"  (1.549 m)   Wt 41.9 kg   SpO2 99%   BMI 17.45 kg/m   Constitutional: No distress . Vital signs reviewed. Cachectic.  HENT: Normocephalic.  Atraumatic. Eyes: EOMI. No discharge. Cardiovascular: No JVD. Respiratory: Normal effort.  No stridor. +[place]. GI: Non-distended. Skin: Warm and dry.  Intact. Psych: Normal mood.  Normal behavior. Musc: No edema in extremities.  No tenderness in extremities. Neuro: Alert HOH Motor: RUE 4+/5 proximal distal LUE: Shoulder abduction, elbow flexion 4-/5, elbow extension 3+/5, distally 0/5 Right lower extremity: 4+/5 proximal distal Left lower  extremity: Hip flexion, knee extension 4/5, ankle dorsiflexion 1+/5  Assessment/Plan: 1. Functional deficits secondary to multiple bilateral infarcts, largest on left with ? Radiculopathy which require 3+ hours per day of interdisciplinary therapy in a comprehensive inpatient rehab setting.  Physiatrist is providing close team supervision and 24 hour management of active medical problems listed below.  Physiatrist and rehab team continue to assess barriers to discharge/monitor patient progress toward functional and medical goals  Care Tool:  Bathing    Body parts bathed by patient: Left arm, Chest, Abdomen, Front perineal area, Right upper leg, Left upper leg, Face   Body parts bathed by helper: Right arm, Buttocks Body parts n/a: Left lower leg, Right lower leg(pt reports her husband did this morning)   Bathing assist Assist Level: Moderate Assistance - Patient 50 - 74%     Upper Body Dressing/Undressing Upper body dressing   What is the patient wearing?: Bra, Pull over shirt    Upper body assist Assist Level: Maximal Assistance - Patient 25 - 49%    Lower Body Dressing/Undressing Lower body dressing      What is the patient wearing?: Incontinence brief     Lower body assist Assist for lower body dressing: Maximal Assistance - Patient 25 - 49%     Toileting Toileting    Toileting assist Assist for toileting: Maximal Assistance - Patient 25 - 49%     Transfers Chair/bed transfer  Transfers assist  Chair/bed transfer activity did not  occur: Safety/medical concerns  Chair/bed transfer assist level: Moderate Assistance - Patient 50 - 74%     Locomotion Ambulation   Ambulation assist      Assist level: Moderate Assistance - Patient 50 - 74% Assistive device: Walker-rolling Max distance: 24ft   Walk 10 feet activity   Assist     Assist level: Moderate Assistance - Patient - 50 - 74% Assistive device: Walker-rolling   Walk 50 feet  activity   Assist Walk 50 feet with 2 turns activity did not occur: Safety/medical concerns         Walk 150 feet activity   Assist Walk 150 feet activity did not occur: Safety/medical concerns         Walk 10 feet on uneven surface  activity   Assist Walk 10 feet on uneven surfaces activity did not occur: Safety/medical concerns         Wheelchair     Assist Will patient use wheelchair at discharge?: No             Wheelchair 50 feet with 2 turns activity    Assist            Wheelchair 150 feet activity     Assist            Medical Problem List and Plan: 1.  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  Left C5 cervical radiculopathy/spinal stenosis  Continue CIR   Cont WHO/PRAFO  2.  Antithrombotics: -DVT/anticoagulation: SCDs.             -antiplatelet therapy: Aspirin 325 mg daily 3. Pain Management: Tramadol as needed  Gabapentin 100 BID started on 5/28 4. Mood: Klonopin 0.5 mg twice daily, Remeron 50 mg nightly             -antipsychotic agents: N/A 5. Neuropsych: This patient is capable of making decisions on her own behalf. 6. Skin/Wound Care: Routine skin checks 7. Fluids/Electrolytes/Nutrition: Routine in and outs 8.  Chronic normocytic anemia.    Continue iron supplement.  Followed outpatient by GI services.  Not a candidate for colonoscopy or endoscopy  Hemoglobin 7.4 on 5/26, labs ordered for Monday  Continue to monitor 9.  Chronic diarrhea  Cont Creon.  Followed outpatient by GI services.   Started Miralax daily   Lactulose prn  KUB personally reviewed, showing constipation  Improving 10.  Unintentional weight loss.  CT abdomen pelvis unremarkable.  Dietary follow-up  Daily weights Filed Weights   09/27/19 1525 09/29/19 0500 09/30/19 0500   Weight: 41.8 kg 39.1 kg 41.9 kg   Stable on 5/28 11.  Hyperlipidemia.  Lipitor 12.  GERD.  Protonix 13.  Supplemental oxygen dependent   Wean as tolerated, continues to require 14.  Hypertension  Lisinopril 2.5 started on 5/27  Elevated, with some improvement, will not make further adjustments today  Monitor with increased mobility 15.  Prediabetes  WNL on 5/28  Monitor with increased mobility 16.  Leukocytosis  WBC 13.8 on 5/28  Afebrile  Continue to monitor  17.  AKI  Creatinine 1.49 on 5/28, labs ordered for Monday  Encourage fluids  Continue to monitor 18.  Severe hypoalbuminemia with malnutrition  Supplement increased on 5/26 19.  Transaminitis  AST elevated, but improving on 5/27  Continue monitor  LOS: 3 days A FACE TO FACE EVALUATION WAS PERFORMED  Ankit Karis Juba 09/30/2019, 8:59 AM

## 2019-10-01 ENCOUNTER — Inpatient Hospital Stay (HOSPITAL_COMMUNITY): Payer: Medicare HMO

## 2019-10-01 ENCOUNTER — Inpatient Hospital Stay (HOSPITAL_COMMUNITY): Payer: Medicare HMO | Admitting: Occupational Therapy

## 2019-10-01 ENCOUNTER — Inpatient Hospital Stay (HOSPITAL_COMMUNITY): Payer: Medicare HMO | Admitting: Physical Therapy

## 2019-10-01 LAB — URINALYSIS, ROUTINE W REFLEX MICROSCOPIC
Bilirubin Urine: NEGATIVE
Glucose, UA: NEGATIVE mg/dL
Ketones, ur: NEGATIVE mg/dL
Nitrite: NEGATIVE
Protein, ur: >=300 mg/dL — AB
RBC / HPF: >50 RBC/hpf — ABNORMAL HIGH (ref 0–5)
Specific Gravity, Urine: 1.018 (ref 1.005–1.030)
pH: 5 (ref 5.0–8.0)

## 2019-10-01 LAB — CBC WITH DIFFERENTIAL/PLATELET
Abs Immature Granulocytes: 0.1 10*3/uL — ABNORMAL HIGH (ref 0.00–0.07)
Basophils Absolute: 0.1 10*3/uL (ref 0.0–0.1)
Basophils Relative: 1 %
Eosinophils Absolute: 0.7 10*3/uL — ABNORMAL HIGH (ref 0.0–0.5)
Eosinophils Relative: 6 %
HCT: 24.8 % — ABNORMAL LOW (ref 36.0–46.0)
Hemoglobin: 7.8 g/dL — ABNORMAL LOW (ref 12.0–15.0)
Immature Granulocytes: 1 %
Lymphocytes Relative: 10 %
Lymphs Abs: 1.3 10*3/uL (ref 0.7–4.0)
MCH: 28.6 pg (ref 26.0–34.0)
MCHC: 31.5 g/dL (ref 30.0–36.0)
MCV: 90.8 fL (ref 80.0–100.0)
Monocytes Absolute: 0.9 10*3/uL (ref 0.1–1.0)
Monocytes Relative: 7 %
Neutro Abs: 9.4 10*3/uL — ABNORMAL HIGH (ref 1.7–7.7)
Neutrophils Relative %: 75 %
Platelets: 311 10*3/uL (ref 150–400)
RBC: 2.73 MIL/uL — ABNORMAL LOW (ref 3.87–5.11)
RDW: 21.7 % — ABNORMAL HIGH (ref 11.5–15.5)
WBC: 12.4 10*3/uL — ABNORMAL HIGH (ref 4.0–10.5)
nRBC: 0 % (ref 0.0–0.2)

## 2019-10-01 LAB — BASIC METABOLIC PANEL
Anion gap: 9 (ref 5–15)
BUN: 47 mg/dL — ABNORMAL HIGH (ref 8–23)
CO2: 28 mmol/L (ref 22–32)
Calcium: 8 mg/dL — ABNORMAL LOW (ref 8.9–10.3)
Chloride: 101 mmol/L (ref 98–111)
Creatinine, Ser: 1.76 mg/dL — ABNORMAL HIGH (ref 0.44–1.00)
GFR calc Af Amer: 33 mL/min — ABNORMAL LOW (ref >60)
GFR calc non Af Amer: 28 mL/min — ABNORMAL LOW (ref >60)
Glucose, Bld: 93 mg/dL (ref 70–99)
Potassium: 6.1 mmol/L — ABNORMAL HIGH (ref 3.5–5.1)
Sodium: 138 mmol/L (ref 135–145)

## 2019-10-01 LAB — LACTIC ACID, PLASMA
Lactic Acid, Venous: 1.2 mmol/L (ref 0.5–1.9)
Lactic Acid, Venous: 1.7 mmol/L (ref 0.5–1.9)

## 2019-10-01 LAB — GLUCOSE, CAPILLARY: Glucose-Capillary: 94 mg/dL (ref 70–99)

## 2019-10-01 MED ORDER — AMLODIPINE BESYLATE 2.5 MG PO TABS
2.5000 mg | ORAL_TABLET | Freq: Every day | ORAL | Status: DC
  Administered 2019-10-02 – 2019-10-05 (×4): 2.5 mg via ORAL
  Filled 2019-10-01 (×4): qty 1

## 2019-10-01 MED ORDER — SODIUM ZIRCONIUM CYCLOSILICATE 10 G PO PACK
10.0000 g | PACK | Freq: Once | ORAL | Status: AC
  Administered 2019-10-01: 10 g via ORAL
  Filled 2019-10-01: qty 1

## 2019-10-01 MED ORDER — SODIUM ZIRCONIUM CYCLOSILICATE 5 G PO PACK: 5.0000 g | PACK | Freq: Once | ORAL | Status: DC

## 2019-10-01 MED ORDER — SODIUM CHLORIDE 0.45 % IV BOLUS
250.0000 mL | Freq: Once | INTRAVENOUS | Status: AC
  Administered 2019-10-01: 250 mL via INTRAVENOUS

## 2019-10-01 MED ORDER — SODIUM CHLORIDE 0.9 % IV SOLN
1.0000 g | Freq: Two times a day (BID) | INTRAVENOUS | Status: DC
  Administered 2019-10-01 – 2019-10-03 (×5): 1 g via INTRAVENOUS
  Filled 2019-10-01: qty 1
  Filled 2019-10-01 (×2): qty 10
  Filled 2019-10-01: qty 1
  Filled 2019-10-01: qty 10
  Filled 2019-10-01 (×2): qty 1

## 2019-10-01 MED ORDER — ATENOLOL 25 MG PO TABS
25.0000 mg | ORAL_TABLET | Freq: Every day | ORAL | Status: DC
  Administered 2019-10-01 – 2019-10-05 (×5): 25 mg via ORAL
  Filled 2019-10-01 (×5): qty 1

## 2019-10-01 NOTE — Progress Notes (Signed)
Patient very lethargic unable to respond to verbal command, assessment done (V/S see flowsheets), Rapid respond nurse called, MD notified, new order given.  Order carried out per protocol. Patient started talking answering all questions appropriately. We continue to monitor.

## 2019-10-01 NOTE — Progress Notes (Signed)
Patient is now awake and alert visiting with family and doing physical therapy. She does not remember speaking this morning.  Discussed elevated potassium as well as UTI with family.  Discussed treatment as well as expected duration

## 2019-10-01 NOTE — Progress Notes (Signed)
   10/01/19 0928  Assess: MEWS Score  BP (!) 145/102  Pulse Rate (!) 111  SpO2 99 %  O2 Device Nasal Cannula  O2 Flow Rate (L/min) 2 L/min  Assess: MEWS Score  MEWS Temp 0  MEWS Systolic 0  MEWS Pulse 2  MEWS RR 0  MEWS LOC 0  MEWS Score 2  MEWS Score Color Yellow  Assess: if the MEWS score is Yellow or Red  Were vital signs taken at a resting state? Yes  Focused Assessment Documented focused assessment  MEWS guidelines implemented *See Row Information* Yes  Treat  MEWS Interventions Escalated (See documentation below)  Take Vital Signs  Increase Vital Sign Frequency  Yellow: Q 2hr X 2 then Q 4hr X 2, if remains yellow, continue Q 4hrs  Escalate  MEWS: Escalate Yellow: discuss with charge nurse/RN and consider discussing with provider and RRT  Notify: Charge Nurse/RN  Name of Charge Nurse/RN Notified Miekides  Date Charge Nurse/RN Notified 10/01/19  Time Charge Nurse/RN Notified [id number]  Notify: Provider  Provider Name/Title Kirstien  Date Provider Notified 10/01/19  Time Provider Notified [phone]  Notification Type Face-to-face  Notification Reason Change in status  Response See new orders  Date of Provider Response 10/01/19  Time of Provider Response 0928  Notify: Rapid Response  Date Rapid Response Notified 10/01/19  Time Rapid Response Notified 1601  Document  Patient Outcome Stabilized after interventions  Progress note created (see row info) Yes

## 2019-10-01 NOTE — Progress Notes (Addendum)
[place] PHYSICAL MEDICINE & REHABILITATION PROGRESS NOTE  Subjective/Complaints:  Called by nursing with concerns about MS changes, was confused and combative this am. Pt awakens to physical stim.  FOllows simple commands Oriented to person not place and time  . ROS: -SOB  Denies CP, N/V/D  Objective: Vital Signs: Blood pressure (!) 148/92, pulse (!) 116, temperature 98.6 F (37 C), resp. rate 18, height 5\' 1"  (1.549 m), weight 42.7 kg, SpO2 94 %. No results found. Recent Labs    09/30/19 0636  WBC 13.8*  HGB 7.4*  HCT 23.9*  PLT 278   Recent Labs    09/29/19 0625 09/30/19 0636  NA 139 138  K 3.8 3.6  CL 104 105  CO2 27 27  GLUCOSE 114* 97  BUN 39* 35*  CREATININE 1.60* 1.49*  CALCIUM 8.4* 8.3*    Physical Exam: BP (!) 148/92 (BP Location: Left Arm)   Pulse (!) 116   Temp 98.6 F (37 C)   Resp 18   Ht 5\' 1"  (1.549 m)   Wt 42.7 kg   SpO2 94%   BMI 17.79 kg/m   Constitutional: No distress . Vital signs reviewed. Cachectic.   General: No acute distress Mood and affect are appropriate Heart: Regular rate and rhythm no rubs murmurs or extra sounds Lungs: Clear to auscultation, breathing unlabored, no rales or wheezes Abdomen: Positive bowel sounds, soft nontender to palpation, nondistended Extremities: No clubbing, cyanosis, or edema Skin: No evidence of breakdown, no evidence of rash  HOH Motor: RUE 4+/5 proximal distal LUE: Shoulder abduction, elbow flexion 4-/5, elbow extension 3+/5, distally 0/5 Right lower extremity: 4+/5 proximal distal Left lower extremity: Hip flexion, knee extension 4/5, ankle dorsiflexion 1+/5  Assessment/Plan: 1. Functional deficits secondary to multiple bilateral infarcts, largest on left with ? Radiculopathy which require 3+ hours per day of interdisciplinary therapy in a comprehensive inpatient rehab setting.  Physiatrist is providing close team supervision and 24 hour management of active medical problems listed  below.  Physiatrist and rehab team continue to assess barriers to discharge/monitor patient progress toward functional and medical goals  Care Tool:  Bathing    Body parts bathed by patient: Left arm, Chest, Abdomen, Front perineal area, Right upper leg, Left upper leg, Face   Body parts bathed by helper: Right arm, Buttocks Body parts n/a: Left lower leg, Right lower leg(pt reports her husband did this morning)   Bathing assist Assist Level: Moderate Assistance - Patient 50 - 74%     Upper Body Dressing/Undressing Upper body dressing   What is the patient wearing?: Pull over shirt    Upper body assist Assist Level: Maximal Assistance - Patient 25 - 49%    Lower Body Dressing/Undressing Lower body dressing      What is the patient wearing?: Incontinence brief, Pants     Lower body assist Assist for lower body dressing: Maximal Assistance - Patient 25 - 49%     Toileting Toileting    Toileting assist Assist for toileting: Maximal Assistance - Patient 25 - 49%     Transfers Chair/bed transfer  Transfers assist  Chair/bed transfer activity did not occur: Safety/medical concerns  Chair/bed transfer assist level: Moderate Assistance - Patient 50 - 74%     Locomotion Ambulation   Ambulation assist      Assist level: Moderate Assistance - Patient 50 - 74% Assistive device: Walker-rolling Max distance: 18ft   Walk 10 feet activity   Assist     Assist level: Moderate  Assistance - Patient - 50 - 74% Assistive device: Walker-rolling   Walk 50 feet activity   Assist Walk 50 feet with 2 turns activity did not occur: Safety/medical concerns         Walk 150 feet activity   Assist Walk 150 feet activity did not occur: Safety/medical concerns         Walk 10 feet on uneven surface  activity   Assist Walk 10 feet on uneven surfaces activity did not occur: Safety/medical concerns         Wheelchair     Assist Will patient use  wheelchair at discharge?: No             Wheelchair 50 feet with 2 turns activity    Assist            Wheelchair 150 feet activity     Assist            Medical Problem List and Plan: 1.  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  Left C5 cervical radiculopathy/spinal stenosis  Continue CIR   Cont WHO/PRAFO  2.  Antithrombotics: -DVT/anticoagulation: SCDs.             -antiplatelet therapy: Aspirin 325 mg daily 3. Pain Management: Tramadol as needed  Gabapentin 100 BID started on 5/28- stop due to MS changes 4. Mood: Klonopin 0.5 mg twice daily, Remeron 50 mg nightly             -antipsychotic agents: N/A 5. Neuropsych: This patient is capable of making decisions on her own behalf. 6. Skin/Wound Care: Routine skin checks 7. Fluids/Electrolytes/Nutrition: Routine in and outs 8.  Chronic normocytic anemia.    Continue iron supplement.  Followed outpatient by GI services.  Not a candidate for colonoscopy or endoscopy  Hemoglobin 7.4 on 5/26, labs ordered for Monday  Continue to monitor 9.  Chronic diarrhea  Cont Creon.  Followed outpatient by GI services.   Started Miralax daily   Lactulose prn  KUB personally reviewed, showing constipation  Improving 10.  Unintentional weight loss.  CT abdomen pelvis unremarkable.  Dietary follow-up  Daily weights Filed Weights   09/29/19 0500 09/30/19 0500 10/01/19 0500  Weight: 39.1 kg 41.9 kg 42.7 kg   Stable on 5/28 11.  Hyperlipidemia.  Lipitor 12.  GERD.  Protonix 13.  Supplemental oxygen dependent   Wean as tolerated, continues to require 14.  Hypertension  Lisinopril 2.5 started on 5/27  On tenormin at home also with tachycardia will resume   Monitor with increased mobility 15.  Prediabetes  WNL on 5/28  Monitor with increased  mobility 16.  Leukocytosis  WBC 13.8 on 5/28  Afebrile  Continue to monitor  17.  AKI  Creatinine 1.49 on 5/28, labs ordered for Monday  Poor intake with AMS , give IVF bolus today 252ml  Continue to monitor 18.  Severe hypoalbuminemia with malnutrition  Supplement increased on 5/26 19.  Transaminitis  AST elevated, but improving on 5/27  Continue monitor 20.  Encephalopathy suspect UTI vs medication (gabapentin started 5/27) await UA, if negative or MS/Neuro exam worsens may need CT head  Now with fever 100.9 check BCx 2 start IV Rocephin  LOS: 4 days A FACE TO [place] E Kirsteins 10/01/2019, 7:34 AM

## 2019-10-01 NOTE — Progress Notes (Signed)
Occupational Therapy Session Note  Patient Details  Name: Patricia Stanley MRN: [id number] Date of Birth: 19-May-1945  Today's Date: 10/01/2019 OT Individual Time: 4503-8882 OT Individual Time Calculation (min): 38 min    Short Term Goals: Week 1:  OT Short Term Goal 1 (Week 1): Pt donn/doff shirt and bra with mod assist. OT Short Term Goal 2 (Week 1): Pt will complete oral hygiene sitting sinkside and using hemitechnique with supervision. OT Short Term Goal 3 (Week 1): Pt will complete BSC transfer with min assist. OT Short Term Goal 4 (Week 1): Pt will bathe UB/LB with mod assist using long handled sponge.  Skilled Therapeutic Interventions/Progress Updates:  Session 1: Patient met lying supine in bed with RN present at bedside to give report. Patient with change in metal status at 0400 this date. RN in agreement with attempted OT treatment session with focus on bed level BADLs to patient's tolerance. Patient very lethargic with inability to keep eyes open for longer than a few seconds at at time. Patient also unable to tell this therapist her name/DOB but able to whisper short phrases and nod her head yes/no. Patient also able to follow simple 1-step verbal commands in prep for bed mobility. OT monitored vitals noting BP of 160/79, SpO2 96% on 2L via , and HR 105-109. As OT assessed vitals, patient opened eyes, looked at this therapist and stated "TAKE IT OFF!" in reference to BP cuff. OT provided eduction on purpose of vitals with patient seeming to accept explanation but not expressing verbal understanding. Patient again closed eyes. Thigh high TEDs doffed and PRAFO donned. OT assisted patient for repositioning in supine with patient able to roll R <> L with Min A and complete anterior scoot toward [facility] with Mod A and use of bed features. OT communication with care team on patients current status. Session concluded with patient lying supine in bed with soft call bell within reach, bed alarm  activated, and all needs met.   Session 2: Patient unable to actively participate with skilled OT treatment session at this time 2/2 AMS. Patient missed 45 minutes of therapy.   Therapy Documentation Precautions:  Precautions Precautions: Fall Precaution Comments: L side weakness, global weakness Restrictions Weight Bearing Restrictions: No General: General OT Amount of Missed Time: 45 Minutes  Therapy/Group: Individual Therapy  Shelby Anderle R [place] 10/01/2019, 6:47 AM

## 2019-10-01 NOTE — Significant Event (Signed)
Rapid Response Event Note  Overview: Time Called: 0920 Arrival Time: 0923 Event Type: Neurologic  Called to bedside because patient difficult to arouse. Initial Focused Assessment: BP 140/84  HR 105  RR 20  O2 sat 97% on 2L [place] Rectal temp 100.9 Patient able to be aroused but needing increased stimulation Lung sounds decreased base, heart tones regular Skin warm to touch  Interventions: Tylenol given IV team placed PIV, Labs drawn and sent.  Patient more interactive and easy to arouse.  Plan of Care (if not transferred): RN to call if patient becomes somnolent again or has abnormal VS.  Event Summary: Name of Physician Notified: Dr Wynn Banker at      at    Outcome: Stayed in room and stabalized  Event End Time: 8375  Marcellina Millin

## 2019-10-01 NOTE — Progress Notes (Signed)
LOC change from last check. VSS: P116; BP 148/92; o2=94% on 2L/[place]. Vital signs are WNL for this Pt. Discussed with on-call Dr. Penni Homans. Dr will assess Pt this morning.

## 2019-10-01 NOTE — Progress Notes (Signed)
Physical Therapy Session Note  Patient Details  Name: Patricia Stanley MRN: [id number] Date of Birth: [birth year]  Today's Date: 10/01/2019 PT Missed Time: 60 Minutes Missed Time Reason: Patient fatigue  Pt asleep in supine, unable to wake up even w/ auditory and tactile cues. Pt breathing comfortably and no signs of distress. Per OT and RN, pt w/ AMS and increased lethargy this AM. Missed 60 min of skilled PT 2/2 fatigue/lethargy. Made NT aware.   Patricia Stanley 10/01/2019, 10:33 AM

## 2019-10-01 NOTE — Progress Notes (Signed)
Patient very awake and alert with family members at bedside doing video call, she denied any stress. We continue to monitor.

## 2019-10-01 NOTE — Progress Notes (Signed)
Speech Language Pathology Daily Session Note  Patient Details  Name: Patricia Stanley MRN: [id number] Date of Birth: 1945-10-11  Today's Date: 10/01/2019 SLP Individual Time: 1402-1430 SLP Individual Time Calculation (min): 28 min  Short Term Goals: Week 1: SLP Short Term Goal 1 (Week 1): Pt will demonstrated sustained attention in functional tasks in 10 minute intervals with min A verbal cues for redirection. SLP Short Term Goal 2 (Week 1): Pt will demonstrate recall of novel and daily information with use of visual aid (memory notebook) given min A verbal cues. SLP Short Term Goal 3 (Week 1): Pt will complete basic problem solving tasks with min A verbal cues. SLP Short Term Goal 4 (Week 1): Pt will self-monitor and self-correct functional errors during basic problem solving tasks with min A verbal cues.  Skilled Therapeutic Interventions: Skilled ST services focused on cognitive skills. MD entered and explained diagnosis of bladder infection, to pt and pt's family (husband and daughter), likely contributing to altered mental status noted earlier today. SLP facilitated basic problem solving and recall in basic money management task utilizing ALFA (counting and displaying change), pt required mod A fade to min A verbal cues for problem solving, error awareness and recall with visual aid. SLP recorded events in memory notebook. SLP will continue to assess changes in mentation at next ST treatment and adjust amount of ST services needed. Pt was left in room with family, call bell within reach and bed alarm set. ST recommends to continue skilled ST services.      Pain Pain Assessment Pain Scale: 0-10 Pain Score: 0-No pain Pain Type: Acute pain Pain Location: Leg Pain Orientation: Right;Left Pain Descriptors / Indicators: Aching Pain Frequency: Constant Pain Onset: On-going Patients Stated Pain Goal: 0 Pain Intervention(s): Medication (See eMAR)  Therapy/Group: Individual Therapy  Javious Hallisey   [facility] 10/01/2019, 5:08 PM

## 2019-10-02 LAB — BASIC METABOLIC PANEL
Anion gap: 8 (ref 5–15)
BUN: 55 mg/dL — ABNORMAL HIGH (ref 8–23)
CO2: 26 mmol/L (ref 22–32)
Calcium: 7.7 mg/dL — ABNORMAL LOW (ref 8.9–10.3)
Chloride: 104 mmol/L (ref 98–111)
Creatinine, Ser: 1.8 mg/dL — ABNORMAL HIGH (ref 0.44–1.00)
GFR calc Af Amer: 32 mL/min — ABNORMAL LOW (ref >60)
GFR calc non Af Amer: 27 mL/min — ABNORMAL LOW (ref >60)
Glucose, Bld: 86 mg/dL (ref 70–99)
Potassium: 3.4 mmol/L — ABNORMAL LOW (ref 3.5–5.1)
Sodium: 138 mmol/L (ref 135–145)

## 2019-10-02 MED ORDER — SODIUM CHLORIDE 0.45 % IV BOLUS
250.0000 mL | Freq: Once | INTRAVENOUS | Status: AC
  Administered 2019-10-02: 250 mL via INTRAVENOUS

## 2019-10-02 NOTE — Progress Notes (Signed)
Patient has been complaining of pain all over. She was given tylenol at the beginning of the shift due to the tramadol not being due yet. She was later given the tramadol. She still c/o pain & calls often for pain medication, crying, moaning. She also c/o being very cold. More blankets were provided. She was given a heating pad as well Vitals were taken & are stable. Will continue to monitor for changes.

## 2019-10-02 NOTE — Progress Notes (Signed)
[place] PHYSICAL MEDICINE & REHABILITATION PROGRESS NOTE  Subjective/Complaints:  Pt alert and oriented , states she's had pain in hands and feet but smiling and moving UE and LE without discomfort, + constipation , states urination doing ok  . ROS: -SOB  Denies CP, N/V/D  Objective: Vital Signs: Blood pressure 136/77, pulse 89, temperature 98.7 F (37.1 C), temperature source Oral, resp. rate 18, height 5\' 1"  (1.549 m), weight 42.7 kg, SpO2 98 %. DG Chest 2 View  Result Date: 10/01/2019 CLINICAL DATA:  Reason for exam: fever due to infection EXAM: CHEST - 2 VIEW COMPARISON:  Chest radiograph 09/27/2019 FINDINGS: Stable cardiomediastinal contours. There are persistent diffuse bilateral reticular and ground-glass opacities. No new focal consolidation. No pneumothorax or pleural effusion. No acute finding in the visualized skeleton. IMPRESSION: Stable chest with persistent diffuse bilateral opacities. Electronically Signed   By: 09/29/2019 M.D.   On: 10/01/2019 13:04   Recent Labs    09/30/19 0636 10/01/19 0940  WBC 13.8* 12.4*  HGB 7.4* 7.8*  HCT 23.9* 24.8*  PLT 278 311   Recent Labs    10/01/19 0940 10/02/19 0554  NA 138 138  K 6.1* 3.4*  CL 101 104  CO2 28 26  GLUCOSE 93 86  BUN 47* 55*  CREATININE 1.76* 1.80*  CALCIUM 8.0* 7.7*    Physical Exam: BP 136/77 (BP Location: Left Arm)   Pulse 89   Temp 98.7 F (37.1 C) (Oral)   Resp 18   Ht 5\' 1"  (1.549 m)   Wt 42.7 kg   SpO2 98%   BMI 17.79 kg/m   Constitutional: No distress . Vital signs reviewed. Cachectic.   General: No acute distress Mood and affect are appropriate Heart: Regular rate and rhythm no rubs murmurs or extra sounds Lungs: Clear to auscultation, breathing unlabored, no rales or wheezes Abdomen: Positive bowel sounds, soft nontender to palpation, nondistended Extremities: No clubbing, cyanosis, or edema Skin: No evidence of breakdown, no evidence of rash  HOH Motor: RUE 4+/5 proximal  distal LUE: Shoulder abduction, elbow flexion 4-/5, elbow extension 3+/5, distally 0/5 Right lower extremity: 4+/5 proximal distal Left lower extremity: Hip flexion, knee extension 4/5, ankle dorsiflexion 1+/5  Assessment/Plan: 1. Functional deficits secondary to multiple bilateral infarcts, largest on left with ? Radiculopathy which require 3+ hours per day of interdisciplinary therapy in a comprehensive inpatient rehab setting.  Physiatrist is providing close team supervision and 24 hour management of active medical problems listed below.  Physiatrist and rehab team continue to assess barriers to discharge/monitor patient progress toward functional and medical goals  Care Tool:  Bathing    Body parts bathed by patient: Left arm, Chest, Abdomen, Front perineal area, Right upper leg, Left upper leg, Face   Body parts bathed by helper: Right arm, Buttocks Body parts n/a: Left lower leg, Right lower leg(pt reports her husband did this morning)   Bathing assist Assist Level: Moderate Assistance - Patient 50 - 74%     Upper Body Dressing/Undressing Upper body dressing   What is the patient wearing?: Pull over shirt    Upper body assist Assist Level: Maximal Assistance - Patient 25 - 49%    Lower Body Dressing/Undressing Lower body dressing      What is the patient wearing?: Incontinence brief, Pants     Lower body assist Assist for lower body dressing: Maximal Assistance - Patient 25 - 49%     Toileting Toileting    Toileting assist Assist for toileting: Maximal  Assistance - Patient 25 - 49%     Transfers Chair/bed transfer  Transfers assist  Chair/bed transfer activity did not occur: Safety/medical concerns  Chair/bed transfer assist level: Moderate Assistance - Patient 50 - 74%     Locomotion Ambulation   Ambulation assist      Assist level: Moderate Assistance - Patient 50 - 74% Assistive device: Walker-rolling Max distance: 72ft   Walk 10 feet  activity   Assist     Assist level: Moderate Assistance - Patient - 50 - 74% Assistive device: Walker-rolling   Walk 50 feet activity   Assist Walk 50 feet with 2 turns activity did not occur: Safety/medical concerns         Walk 150 feet activity   Assist Walk 150 feet activity did not occur: Safety/medical concerns         Walk 10 feet on uneven surface  activity   Assist Walk 10 feet on uneven surfaces activity did not occur: Safety/medical concerns         Wheelchair     Assist Will patient use wheelchair at discharge?: No             Wheelchair 50 feet with 2 turns activity    Assist            Wheelchair 150 feet activity     Assist            Medical Problem List and Plan: 1.  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  Left C5 cervical radiculopathy/spinal stenosis  Continue CIR   Cont WHO/PRAFO  2.  Antithrombotics: -DVT/anticoagulation: SCDs.             -antiplatelet therapy: Aspirin 325 mg daily 3. Pain Management: Tramadol as needed  Gabapentin 100 BID started on 5/28- stop due to MS changes 4. Mood: Klonopin 0.5 mg twice daily, Remeron 50 mg nightly             -antipsychotic agents: N/A 5. Neuropsych: This patient is capable of making decisions on her own behalf. 6. Skin/Wound Care: Routine skin checks 7. Fluids/Electrolytes/Nutrition: Routine in and outs 8.  Chronic normocytic anemia.    Continue iron supplement.  Followed outpatient by GI services.  Not a candidate for colonoscopy or endoscopy  Hemoglobin 7.4 on 5/26, labs ordered for Monday  Continue to monitor 9.  Chronic diarrhea  Cont Creon.  Followed outpatient by GI services.   Started Miralax daily   Lactulose prn  KUB personally reviewed, showing constipation  Improving 10.   Unintentional weight loss.  CT abdomen pelvis unremarkable.  Dietary follow-up  Daily weights Filed Weights   09/29/19 0500 09/30/19 0500 10/01/19 0500  Weight: 39.1 kg 41.9 kg 42.7 kg   Stable on 5/28 11.  Hyperlipidemia.  Lipitor 12.  GERD.  Protonix 13.  Supplemental oxygen dependent   Wean as tolerated, continues to require 14.  Hypertension  Lisinopril 2.5 started on 5/27- d/c due to AKI   On tenormin at home also with tachycardia will resume    Vitals:   10/01/19 2118 10/02/19 0316  BP: 140/75 136/77  Pulse: 88 89  Resp: 18 18  Temp: 98.7 F (37.1 C) 98.7 F (37.1 C)  SpO2: 99% 98%  improving  15.  Prediabetes  WNL on 5/28  Monitor with increased mobility 16.  Leukocytosis  WBC 13.8 on 5/28, 12.4 on 5/29   Afebrile this am   Continue to monitor  17.  AKI  Creatinine 1.49 on 5/28, Creat 1.8 today, have d/ced lisinopril , repeat fluid bolus   Poor intake with AMS , give IVF bolus today 250ml  Continue to monitor 18.  Severe hypoalbuminemia with malnutrition  Supplement increased on 5/26 19.  Transaminitis  AST elevated, but improving on 5/27  Continue monitor 20.  Encephalopathy suspect UTI vs medication (gabapentin started 5/27) + UA, Now  Fever tmax 100.9 check BCx 2 cont  IV Rocephin  Improved 5/30, pleasant and cooperative  21. Hyperkalemia improved after Lokelma repeat BMET in am LOS: 5 days A FACE TO [place] E Merritt Kibby 10/02/2019, 7:37 AM

## 2019-10-03 ENCOUNTER — Inpatient Hospital Stay (HOSPITAL_COMMUNITY): Payer: Medicare HMO | Admitting: Physical Therapy

## 2019-10-03 ENCOUNTER — Inpatient Hospital Stay (HOSPITAL_COMMUNITY): Payer: Medicare HMO | Admitting: Occupational Therapy

## 2019-10-03 DIAGNOSIS — N390 Urinary tract infection, site not specified: Secondary | ICD-10-CM

## 2019-10-03 DIAGNOSIS — R0989 Other specified symptoms and signs involving the circulatory and respiratory systems: Secondary | ICD-10-CM

## 2019-10-03 DIAGNOSIS — I63019 Cerebral infarction due to thrombosis of unspecified vertebral artery: Secondary | ICD-10-CM

## 2019-10-03 LAB — BASIC METABOLIC PANEL
Anion gap: 7 (ref 5–15)
BUN: 59 mg/dL — ABNORMAL HIGH (ref 8–23)
CO2: 27 mmol/L (ref 22–32)
Calcium: 7.9 mg/dL — ABNORMAL LOW (ref 8.9–10.3)
Chloride: 109 mmol/L (ref 98–111)
Creatinine, Ser: 1.94 mg/dL — ABNORMAL HIGH (ref 0.44–1.00)
GFR calc Af Amer: 29 mL/min — ABNORMAL LOW (ref >60)
GFR calc non Af Amer: 25 mL/min — ABNORMAL LOW (ref >60)
Glucose, Bld: 103 mg/dL — ABNORMAL HIGH (ref 70–99)
Potassium: 3.1 mmol/L — ABNORMAL LOW (ref 3.5–5.1)
Sodium: 143 mmol/L (ref 135–145)

## 2019-10-03 LAB — CBC WITH DIFFERENTIAL/PLATELET
Abs Immature Granulocytes: 0.07 10*3/uL (ref 0.00–0.07)
Basophils Absolute: 0.1 10*3/uL (ref 0.0–0.1)
Basophils Relative: 1 %
Eosinophils Absolute: 1 10*3/uL — ABNORMAL HIGH (ref 0.0–0.5)
Eosinophils Relative: 10 %
HCT: 24.6 % — ABNORMAL LOW (ref 36.0–46.0)
Hemoglobin: 7.3 g/dL — ABNORMAL LOW (ref 12.0–15.0)
Immature Granulocytes: 1 %
Lymphocytes Relative: 12 %
Lymphs Abs: 1.2 10*3/uL (ref 0.7–4.0)
MCH: 27.9 pg (ref 26.0–34.0)
MCHC: 29.7 g/dL — ABNORMAL LOW (ref 30.0–36.0)
MCV: 93.9 fL (ref 80.0–100.0)
Monocytes Absolute: 0.7 10*3/uL (ref 0.1–1.0)
Monocytes Relative: 7 %
Neutro Abs: 7.3 10*3/uL (ref 1.7–7.7)
Neutrophils Relative %: 69 %
Platelets: 305 10*3/uL (ref 150–400)
RBC: 2.62 MIL/uL — ABNORMAL LOW (ref 3.87–5.11)
RDW: 21.5 % — ABNORMAL HIGH (ref 11.5–15.5)
WBC: 10.3 10*3/uL (ref 4.0–10.5)
nRBC: 0 % (ref 0.0–0.2)

## 2019-10-03 LAB — URINE CULTURE: Culture: >=100000 — AB

## 2019-10-03 MED ORDER — POTASSIUM CHLORIDE CRYS ER 20 MEQ PO TBCR
20.0000 meq | EXTENDED_RELEASE_TABLET | Freq: Once | ORAL | Status: AC
  Administered 2019-10-03: 20 meq via ORAL
  Filled 2019-10-03: qty 1

## 2019-10-03 MED ORDER — CEPHALEXIN 250 MG PO CAPS
500.0000 mg | ORAL_CAPSULE | Freq: Two times a day (BID) | ORAL | Status: DC
  Administered 2019-10-03 – 2019-10-04 (×2): 500 mg via ORAL
  Filled 2019-10-03 (×2): qty 2

## 2019-10-03 MED ORDER — CEPHALEXIN 250 MG PO CAPS: 500.0000 mg | ORAL_CAPSULE | Freq: Two times a day (BID) | ORAL | Status: DC

## 2019-10-03 NOTE — Progress Notes (Signed)
[place] PHYSICAL MEDICINE & REHABILITATION PROGRESS NOTE  Subjective/Complaints: Patient seen sitting up in her chair this morning.  She states she slept well overnight.  She states she slept fairly over the weekend due to pain.  She is about to work with therapies, discussed pain and limitations with therapies, will further assess today given new medications.  ROS: + Shortness of breath.  Denies CP, N/V/D  Objective: Vital Signs: Blood pressure (!) 149/63, pulse 75, temperature 98.4 F (36.9 C), resp. rate 16, height 5\' 1"  (1.549 m), weight 42.7 kg, SpO2 99 %. DG Chest 2 View  Result Date: 10/01/2019 CLINICAL DATA:  Reason for exam: fever due to infection EXAM: CHEST - 2 VIEW COMPARISON:  Chest radiograph 09/27/2019 FINDINGS: Stable cardiomediastinal contours. There are persistent diffuse bilateral reticular and ground-glass opacities. No new focal consolidation. No pneumothorax or pleural effusion. No acute finding in the visualized skeleton. IMPRESSION: Stable chest with persistent diffuse bilateral opacities. Electronically Signed   By: 09/29/2019 M.D.   On: 10/01/2019 13:04   Recent Labs    10/01/19 0940 10/03/19 0559  WBC 12.4* 10.3  HGB 7.8* 7.3*  HCT 24.8* 24.6*  PLT 311 305   Recent Labs    10/02/19 0554 10/03/19 0559  NA 138 143  K 3.4* 3.1*  CL 104 109  CO2 26 27  GLUCOSE 86 103*  BUN 55* 59*  CREATININE 1.80* 1.94*  CALCIUM 7.7* 7.9*    Physical Exam: BP (!) 149/63   Pulse 75   Temp 98.4 F (36.9 C)   Resp 16   Ht 5\' 1"  (1.549 m)   Wt 42.7 kg   SpO2 99%   BMI 17.79 kg/m   Constitutional: No distress . Vital signs reviewed.  Cachectic. HENT: Normocephalic.  Atraumatic. Eyes: EOMI. No discharge. Cardiovascular: No JVD. Respiratory: Normal effort.  No stridor. GI: Non-distended. Skin: Warm and dry.  Intact. Psych: Normal mood.  Normal behavior. Musc: No edema in extremities.  No tenderness in extremities. Neuro: Alert HOH Motor: RUE 4+/5  proximal distal LUE: Shoulder abduction, elbow flexion 4-/5, elbow extension 3+/5, distally 0/5, unchanged Right lower extremity: 4+/5 proximal distal Left lower extremity: Hip flexion, knee extension 4/5, ankle dorsiflexion 2-/5  Assessment/Plan: 1. Functional deficits secondary to multiple bilateral infarcts, largest on left with ? Radiculopathy which require 3+ hours per day of interdisciplinary therapy in a comprehensive inpatient rehab setting.  Physiatrist is providing close team supervision and 24 hour management of active medical problems listed below.  Physiatrist and rehab team continue to assess barriers to discharge/monitor patient progress toward functional and medical goals  Care Tool:  Bathing    Body parts bathed by patient: Left arm, Chest, Abdomen, Front perineal area, Right upper leg, Left upper leg, Face   Body parts bathed by helper: Right arm, Buttocks Body parts n/a: Left lower leg, Right lower leg(pt reports her husband did this morning)   Bathing assist Assist Level: Moderate Assistance - Patient 50 - 74%     Upper Body Dressing/Undressing Upper body dressing   What is the patient wearing?: Pull over shirt    Upper body assist Assist Level: Maximal Assistance - Patient 25 - 49%    Lower Body Dressing/Undressing Lower body dressing      What is the patient wearing?: Incontinence brief, Pants     Lower body assist Assist for lower body dressing: Maximal Assistance - Patient 25 - 49%     Toileting Toileting    Toileting assist Assist  for toileting: Maximal Assistance - Patient 25 - 49%     Transfers Chair/bed transfer  Transfers assist  Chair/bed transfer activity did not occur: Safety/medical concerns  Chair/bed transfer assist level: Moderate Assistance - Patient 50 - 74%     Locomotion Ambulation   Ambulation assist      Assist level: Moderate Assistance - Patient 50 - 74% Assistive device: Walker-rolling Max distance: 70ft    Walk 10 feet activity   Assist     Assist level: Moderate Assistance - Patient - 50 - 74% Assistive device: Walker-rolling   Walk 50 feet activity   Assist Walk 50 feet with 2 turns activity did not occur: Safety/medical concerns         Walk 150 feet activity   Assist Walk 150 feet activity did not occur: Safety/medical concerns         Walk 10 feet on uneven surface  activity   Assist Walk 10 feet on uneven surfaces activity did not occur: Safety/medical concerns         Wheelchair     Assist Will patient use wheelchair at discharge?: No             Wheelchair 50 feet with 2 turns activity    Assist            Wheelchair 150 feet activity     Assist            Medical Problem List and Plan: 1.  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  Left C5 cervical radiculopathy/spinal stenosis  Continue CIR   WHO/PRAFO  2.  Antithrombotics: -DVT/anticoagulation: SCDs.             -antiplatelet therapy: Aspirin 325 mg daily 3. Pain Management: Tramadol as needed  Gabapentin 100 BID started on 5/28 DC'd due to?  Cognitive side effects, will consider retrial  Patient complains of pain, but appears comfortable on 5/31 4. Mood: Klonopin 0.5 mg twice daily, Remeron 50 mg nightly             -antipsychotic agents: N/A 5. Neuropsych: This patient is capable of making decisions on her own behalf. 6. Skin/Wound Care: Routine skin checks 7. Fluids/Electrolytes/Nutrition: Routine in and outs 8.  Chronic normocytic anemia.    Continue iron supplement.  Followed outpatient by GI services.  Not a candidate for colonoscopy or endoscopy  Hemoglobin 7.3 on 5/31  Continue to monitor 9.  Chronic diarrhea  Cont Creon.  Followed outpatient by GI services.   Started Miralax  daily   Lactulose prn  KUB personally reviewed, showing constipation  Improving 10.  Unintentional weight loss.  CT abdomen pelvis unremarkable.  Dietary follow-up  Daily weights Filed Weights   09/29/19 0500 09/30/19 0500 10/01/19 0500  Weight: 39.1 kg 41.9 kg 42.7 kg   Improving on 5/31 11.  Hyperlipidemia.  Lipitor 12.  GERD.  Protonix 13.  Supplemental oxygen dependent   Wean as tolerated, continues to require on 5/31 14.  Hypertension  Lisinopril 2.5 started on 5/27- d/c due to AKI   Tenormin resumed on 5/29   Vitals:   10/03/19 0511 10/03/19 0600  BP: (!) 175/96 (!) 149/63  Pulse: 85 75  Resp:    Temp: 98.4 F (36.9 C)   SpO2: 99%    Labile on 5/31 15.  Prediabetes  Relatively controlled  on 5/31  Monitor with increased mobility 16.  Leukocytosis: Resolved  WBC 10.3 on 5/31  Afebrile this am   Continue to monitor  17.  AKI  Creatinine 1.94 on 5/31, labs ordered for tomorrow  Encourage fluids, may require further IVF  Continue to monitor 18.  Severe hypoalbuminemia with malnutrition  Supplement increased on 5/26 19.  Transaminitis  AST elevated, but improving on 5/27  Continue monitor 20.  Acute lower UTI  Encephalopathy appears resolved  UA +, urine culture showing Enterococcus, susceptibilities pending  Continue on IV empiric Rocephin 21.  Potassium  3.1 on 5/31 after dose of Lokelma, labs ordered for tomorrow  Supplemented x1 day on 5/31  LOS: 6 days A FACE TO FACE EVALUATION WAS PERFORMED  Patricia Stanley Karis Juba 10/03/2019, 8:20 AM

## 2019-10-03 NOTE — Progress Notes (Signed)
Occupational Therapy Session Note  Patient Details  Name: SAUL DORSI MRN: [id number] Date of Birth: 1945-09-19  Today's Date: 10/03/2019 OT Individual Time: 2202-5427 OT Individual Time Calculation (min): 17 min  and Today's Date: 10/03/2019 OT Missed Time: 45 Minutes Missed Time Reason: Pain   Short Term Goals: Week 1:  OT Short Term Goal 1 (Week 1): Pt donn/doff shirt and bra with mod assist. OT Short Term Goal 2 (Week 1): Pt will complete oral hygiene sitting sinkside and using hemitechnique with supervision. OT Short Term Goal 3 (Week 1): Pt will complete BSC transfer with min assist. OT Short Term Goal 4 (Week 1): Pt will bathe UB/LB with mod assist using long handled sponge.  Skilled Therapeutic Interventions/Progress Updates:    Upon entering the room, pt lying quietly in her bed in side lying position. Pt keeping eyes closed and OT barely able to hear pt speaking but she reports headache pain. RN is aware and she has had all of her medication. Pt asking therapist to turn off lights. OT turning off lights and turning TV off to create quiet environment for pt to rest. Pt declined OT intervention secondary to severe headache pain. RN is aware of missed session. Call bell within reach and bed alarm activated as therapist exits the room.   Therapy Documentation Precautions:  Precautions Precautions: Fall Precaution Comments: L side weakness, global weakness Restrictions Weight Bearing Restrictions: No General: General OT Amount of Missed Time: 45 Minutes  Pain: Pain Assessment Pain Scale: 0-10 Pain Score: 8  Pain Type: Acute pain Pain Location: Head Pain Orientation: Anterior Pain Descriptors / Indicators: Headache Pain Frequency: Intermittent Pain Onset: On-going Patients Stated Pain Goal: 3 Pain Intervention(s): RN made aware;Medication (See eMAR) Multiple Pain Sites: No ADL: ADL Grooming: Moderate assistance Where Assessed-Grooming: Sitting at sink Upper Body  Bathing: Maximal assistance, Moderate cueing Where Assessed-Upper Body Bathing: Sitting at sink Lower Body Bathing: Maximal assistance, Moderate cueing Where Assessed-Lower Body Bathing: Sitting at sink Upper Body Dressing: Maximal assistance, Moderate cueing Where Assessed-Upper Body Dressing: Sitting at sink Lower Body Dressing: Maximal assistance Where Assessed-Lower Body Dressing: Sitting at sink, Standing at sink Toileting: Moderate assistance Where Assessed-Toileting: Bedside Commode Toilet Transfer: Moderate assistance Toilet Transfer Method: Squat pivot Toilet Transfer Equipment: Drop arm bedside commode Tub/Shower Transfer: Not assessed   Therapy/Group: Individual Therapy  Alen Bleacher 10/03/2019, 10:17 AM

## 2019-10-03 NOTE — Progress Notes (Signed)
Physical Therapy Session Note  Patient Details  Name: Patricia Stanley MRN: [id number] Date of Birth: 08-Jun-1945  Today's Date: 10/03/2019 PT Individual Time: 1302-1400 PT Individual Time Calculation (min): 58 min   Short Term Goals: Week 1:  PT Short Term Goal 1 (Week 1): Pt will perform bed<>chair transfer with min assist PT Short Term Goal 2 (Week 1): Pt will ascend/descned 4 steps with B rails and mod assist PT Short Term Goal 3 (Week 1): PT will ambulate >20 ft with LRAD and mod assist  Skilled Therapeutic Interventions/Progress Updates:    Patient received up in [facility], pleasant and willing to work with therapy today. Applied thigh high TEDS totalA, and no dizziness reported this afternoon as it as in the morning. Focused most of session on pre-gait activities (including standing marches, forward and backward stepping, heel raises, standing hip ABD, and simply maintaining static standing for time and standing exercises in parallel bars with frequent rest breaks as she is very easy to fatigue and limited by ongoing soreness in BLEs with weight bearing- subjectively reported fatigue 10/10 this afternoon. Able to gait train 12f in parallel bars but very unsafe with very poor L foot clearance and scissoring, unable to attempt to walk further due to fatigue. [facility] chair alarm active and spouse present. Did much better this afternoon than she did this morning.   Therapy Documentation Precautions:  Precautions Precautions: Fall Precaution Comments: L side weakness, global weakness Restrictions Weight Bearing Restrictions: No Pain: Pain Assessment Pain Scale: Faces Faces Pain Scale: No hurt    Therapy/Group: Individual Therapy   KWindell Norfolk DPT, PN1   Supplemental Physical Therapist [place]   Pager [phone] Rehab Office [phone]   10/03/2019, 3:39 PM

## 2019-10-03 NOTE — Progress Notes (Signed)
Physical Therapy Session Note  Patient Details  Name: Patricia Stanley MRN: [id number] Date of Birth: 07/18/1945  Today's Date: 10/03/2019 PT Individual Time: 0800-0912 PT Individual Time Calculation (min): 72 min   Short Term Goals: Week 1:  PT Short Term Goal 1 (Week 1): Pt will perform bed<>chair transfer with min assist PT Short Term Goal 2 (Week 1): Pt will ascend/descned 4 steps with B rails and mod assist PT Short Term Goal 3 (Week 1): PT will ambulate >20 ft with LRAD and mod assist  Skilled Therapeutic Interventions/Progress Updates:    Patient received up in [facility], immediately reporting she doesn't  Know how much therapy she can do because she is weak and she has a headache; RN alerted and provided pain medication for HA. Housekeeping in room speaking to patient about her headache which was very distracting to patient who mirrored what housekeeper said about headache. Required quite a bit of encouragement but able to convince her to go the PT gym. Performed multiple sit to stands with RW and ModA, standing duration limited by BLE pain and no more than 45-60 seconds; BP in sitting 149/63, standing 101/73 and patient symptomatic- will try TEDS next session. Deferred gait training due to pain and very low activity tolerance this morning. Tolerated pulling WC with BLEs approximately 24fx2 with Min-ModA and cues for technique but fatigued, also tolerated riding Nustep two bouts of 60 seconds with BLEs at level 1 resistance. Of note, SPO2 drops from 99% on 2LPM O2 to 87-88% on room air with static standing, unable to really recover with pursed lip breathing due to cognition and attention to pursed lip breathing task so replaced 2LPM per [place]. [facility], seatbelt alarm active this morning.   Therapy Documentation Precautions:  Precautions Precautions: Fall Precaution Comments: L side weakness, global weakness Restrictions Weight Bearing Restrictions: No Pain: Pain  Assessment Pain Scale: 0-10 Pain Score: 8  Pain Type: Acute pain Pain Location: Head Pain Orientation: Anterior Pain Descriptors / Indicators: Headache Pain Frequency: Intermittent Pain Onset: On-going Patients Stated Pain Goal: 3 Pain Intervention(s): RN made aware;Medication (See eMAR) Multiple Pain Sites: No    Therapy/Group: Individual Therapy   KWindell Norfolk DPT, PN1   Supplemental Physical Therapist [place]   Pager [phone] Rehab Office [phone]   10/03/2019, 9:14 AM

## 2019-10-04 ENCOUNTER — Encounter (HOSPITAL_COMMUNITY): Payer: Medicare HMO | Admitting: Psychology

## 2019-10-04 ENCOUNTER — Inpatient Hospital Stay (HOSPITAL_COMMUNITY): Payer: Medicare HMO | Admitting: Physical Therapy

## 2019-10-04 ENCOUNTER — Inpatient Hospital Stay (HOSPITAL_COMMUNITY): Payer: Medicare HMO | Admitting: Occupational Therapy

## 2019-10-04 ENCOUNTER — Inpatient Hospital Stay (HOSPITAL_COMMUNITY): Payer: Medicare HMO | Admitting: Speech Pathology

## 2019-10-04 LAB — BASIC METABOLIC PANEL
Anion gap: 10 (ref 5–15)
BUN: 58 mg/dL — ABNORMAL HIGH (ref 8–23)
CO2: 25 mmol/L (ref 22–32)
Calcium: 8.2 mg/dL — ABNORMAL LOW (ref 8.9–10.3)
Chloride: 106 mmol/L (ref 98–111)
Creatinine, Ser: 1.95 mg/dL — ABNORMAL HIGH (ref 0.44–1.00)
GFR calc Af Amer: 29 mL/min — ABNORMAL LOW (ref >60)
GFR calc non Af Amer: 25 mL/min — ABNORMAL LOW (ref >60)
Glucose, Bld: 95 mg/dL (ref 70–99)
Potassium: 3.6 mmol/L (ref 3.5–5.1)
Sodium: 141 mmol/L (ref 135–145)

## 2019-10-04 MED ORDER — AMOXICILLIN 500 MG PO CAPS
500.0000 mg | ORAL_CAPSULE | Freq: Two times a day (BID) | ORAL | Status: DC
  Administered 2019-10-04 – 2019-10-05 (×2): 500 mg via ORAL
  Filled 2019-10-04 (×2): qty 1

## 2019-10-04 MED ORDER — GABAPENTIN 100 MG PO CAPS
100.0000 mg | ORAL_CAPSULE | Freq: Every day | ORAL | Status: DC
  Administered 2019-10-04 – 2019-10-05 (×2): 100 mg via ORAL
  Filled 2019-10-04 (×2): qty 1

## 2019-10-04 MED ORDER — SODIUM CHLORIDE 0.9 % IV BOLUS
1000.0000 mL | Freq: Once | INTRAVENOUS | Status: AC
  Administered 2019-10-04: 1000 mL via INTRAVENOUS

## 2019-10-04 NOTE — Progress Notes (Signed)
Occupational Therapy Session Note  Patient Details  Name: Patricia Stanley MRN: [id number] Date of Birth: [birth year]  Today's Date: 10/04/2019   OT Missed Time: 45 Minutes Missed Time Reason: Patient ill (comment);Patient fatigue   Short Term Goals: Week 1:  OT Short Term Goal 1 (Week 1): Pt donn/doff shirt and bra with mod assist. OT Short Term Goal 2 (Week 1): Pt will complete oral hygiene sitting sinkside and using hemitechnique with supervision. OT Short Term Goal 3 (Week 1): Pt will complete BSC transfer with min assist. OT Short Term Goal 4 (Week 1): Pt will bathe UB/LB with mod assist using long handled sponge.  Skilled Therapeutic Interventions/Progress Updates:    Pt received in bed sound asleep with spouse present. Pt on IV treatment and attempted to wake pt but she would not open her eyes. Spouse stated she did not sleep well and has been complaining of body aches.  Did not try to further wake patient as she needs to rest for now.  May try to reschedule for this afternoon.   Therapy Documentation Precautions:  Precautions Precautions: Fall Precaution Comments: L side weakness, global weakness Restrictions Weight Bearing Restrictions: No General: General OT Amount of Missed Time: 45 Minutes    Therapy/Group: Individual Therapy  Jacarri Gesner 10/04/2019, 11:53 AM

## 2019-10-04 NOTE — Progress Notes (Signed)
Physical Therapy Session Note  Patient Details  Name: Patricia Stanley MRN: [id number] Date of Birth: Jun 25, 1945  Today's Date: 10/04/2019 PT Individual Time: 1345-1435 PT Individual Time Calculation (min): 50 min   Short Term Goals: Week 1:  PT Short Term Goal 1 (Week 1): Pt will perform bed<>chair transfer with min assist PT Short Term Goal 2 (Week 1): Pt will ascend/descned 4 steps with B rails and mod assist PT Short Term Goal 3 (Week 1): PT will ambulate >20 ft with LRAD and mod assist  Skilled Therapeutic Interventions/Progress Updates:    Patient received in bed in much better spirits than this morning. She was soiled, and able to roll from side to side with MinA for donning of soiled clothing and donning of new ones, still totalA for donning/doffing however. Actually able to complete supine to sit with MinA today, however continues to require ModA for functional transfers and sit to stands this afternoon. Attempted gait at rail in hallway however she refused to attempt due to LLE pain. Otherwise able to pull WC approximately 24f using BLEs (hamstring pulls) with MinA for straight line navigation in WC. Requires extended time and coaxing for all activities today. [facility] and husband present, all needs otherwise met this afternoon.   Therapy Documentation Precautions:  Precautions Precautions: Fall Precaution Comments: L side weakness, global weakness Restrictions Weight Bearing Restrictions: No Pain: Pain Assessment Pain Scale: Faces Pain Score: 8  Faces Pain Scale: Hurts little more Pain Type: Acute pain Pain Location: Leg Pain Orientation: Left Pain Descriptors / Indicators: Aching;Sore;Discomfort Pain Onset: On-going Patients Stated Pain Goal: 0 Pain Intervention(s): Repositioned;Distraction Multiple Pain Sites: No    Therapy/Group: Individual Therapy   KWindell Norfolk DPT, PN1   Supplemental Physical Therapist [place]    Pager [phone] Rehab Office [phone]   10/04/2019, 3:48 PM

## 2019-10-04 NOTE — Progress Notes (Addendum)
Speech Language Pathology Daily Session Note  Patient Details  Name: Patricia Stanley MRN: [id number] Date of Birth: 1945-12-08  Today's Date: 10/04/2019 SLP Individual Time: 1191-4782 SLP Individual Time Calculation (min): 43 min  Short Term Goals: Week 1: SLP Short Term Goal 1 (Week 1): Pt will demonstrated sustained attention in functional tasks in 10 minute intervals with min A verbal cues for redirection. SLP Short Term Goal 2 (Week 1): Pt will demonstrate recall of novel and daily information with use of visual aid (memory notebook) given min A verbal cues. SLP Short Term Goal 3 (Week 1): Pt will complete basic problem solving tasks with min A verbal cues. SLP Short Term Goal 4 (Week 1): Pt will self-monitor and self-correct functional errors during basic problem solving tasks with min A verbal cues.  Skilled Therapeutic Interventions: Pt was seen for skilled ST targeting cognition. She was very lethargic and slow to arouse, but with Max A multimodal interventions she aroused and was agreeable to sit upright to attempt to eat lunch. Pt sustained her attention throughout session with Min A verbal cues for redirection to tasks. She asked off-topic questions throughout session, however easily redirected. Pt expressed her preferences throughout meal with Min A question cues. After PO intake, pt required Max A for recall of functional daily information via use of external aids and compensatory strategies including memory notebook. Pt was oriented X4, although questions did have to be repeated suspect due to slow processing today. Pt left laying in bed with alarm set and needs within reach. Continue per current plan of care.      Pain Pain Assessment Pain Scale: Faces Faces Pain Scale: Hurts little more Pain Type: Acute pain Pain Location: Foot Pain Orientation: Right Pain Descriptors / Indicators: Grimacing;Discomfort Pain Onset: On-going Patients Stated Pain Goal: 3 Pain Intervention(s):  Repositioned Multiple Pain Sites: No  Therapy/Group: Individual Therapy  Little Ishikawa 10/04/2019, 6:57 AM

## 2019-10-04 NOTE — Progress Notes (Signed)
Physical Therapy Session Note  Patient Details  Name: Patricia Stanley MRN: [id number] Date of Birth: 01-29-1946  Today's Date: 10/04/2019 PT Individual Time: 0800-0812  PT Individual Time Calculation (min): 12 minutes   Short Term Goals: Week 1:  PT Short Term Goal 1 (Week 1): Pt will perform bed<>chair transfer with min assist PT Short Term Goal 2 (Week 1): Pt will ascend/descned 4 steps with B rails and mod assist PT Short Term Goal 3 (Week 1): PT will ambulate >20 ft with LRAD and mod assist  Skilled Therapeutic Interventions/Progress Updates:    Patient received in bed, upset and crying (see note from earlier); emotional support and education provided regarding importance of participating in PT as well as ways therapist can modify session to account for pain in BLEs and fatigue, importance of full participation in CIR however she continues to refuse this morning.    Therapy Documentation Precautions:  Precautions Precautions: Fall Precaution Comments: L side weakness, global weakness Restrictions Weight Bearing Restrictions: No    Pain: Pain Assessment Pain Scale: 0-10 Pain Score: 8  Pain Type: Acute pain Pain Location: Leg Pain Orientation: Right;Left Pain Descriptors / Indicators: Aching;Sore Pain Onset: On-going Patients Stated Pain Goal: 3 Pain Intervention(s): RN made aware;Medication (See eMAR) Multiple Pain Sites: No     Therapy/Group: Individual Therapy   Madelaine Etienne, DPT, PN1   Supplemental Physical Therapist [facility]    Pager [phone] Acute Rehab Office [phone]    10/04/2019, 12:09 PM

## 2019-10-04 NOTE — Progress Notes (Signed)
Physical Therapy Session Note  Patient Details  Name: Patricia Stanley MRN: [id number] Date of Birth: [birth year]  Today's Date: 10/04/2019 PT Individual Time: 4098-1191 PT Individual Time Calculation (min): 28 min   Short Term Goals: Week 1:  PT Short Term Goal 1 (Week 1): Pt will perform bed<>chair transfer with min assist PT Short Term Goal 2 (Week 1): Pt will ascend/descned 4 steps with B rails and mod assist PT Short Term Goal 3 (Week 1): PT will ambulate >20 ft with LRAD and mod assist  Skilled Therapeutic Interventions/Progress Updates:    Patient received in bed, now agreeable to bed level session including SLRs, SAQS, supine hip abduction, hamstring curls, heel cord stretches, and L shoulder flexion and biceps curls. Remains very pain and fatigue limited this morning, also very self limiting and anxious. SpO2 94-95% on 1LPM O2 per [place]. Left in bed with all needs met, bed alarm active and PRAFO applied.   Therapy Documentation Precautions:  Precautions Precautions: Fall Precaution Comments: L side weakness, global weakness Restrictions Weight Bearing Restrictions: No General: PT Amount of Missed Time (min): 20 Minutes PT Missed Treatment Reason: Pain;Patient unwilling to participate Vital Signs:  Pain: Pain Assessment Pain Scale: 0-10 Pain Score: 8  Pain Type: Acute pain Pain Location: Leg Pain Orientation: Right;Left Pain Descriptors / Indicators: Aching;Sore Pain Onset: On-going Patients Stated Pain Goal: 3 Pain Intervention(s): RN made aware;Medication (See eMAR) Multiple Pain Sites: No    Therapy/Group: Individual Therapy   Windell Norfolk, DPT, PN1   Supplemental Physical Therapist [place]    Pager [phone] Acute Rehab Office [phone]    10/04/2019, 12:16 PM

## 2019-10-04 NOTE — Plan of Care (Signed)
  Problem: Consults Goal: RH STROKE PATIENT EDUCATION Description: See Patient Education module for education specifics  Outcome: Progressing   Problem: RH BOWEL ELIMINATION Goal: RH STG MANAGE BOWEL WITH ASSISTANCE Description: STG Manage Bowel with min Assistance.  Outcome: Progressing Goal: RH STG MANAGE BOWEL W/MEDICATION W/ASSISTANCE Description: STG Manage Bowel with Medication with min Assistance.  Outcome: Progressing   Problem: RH BLADDER ELIMINATION Goal: RH STG MANAGE BLADDER WITH ASSISTANCE Description: STG Manage Bladder With min Assistance  Outcome: Progressing   Problem: RH SKIN INTEGRITY Goal: RH STG SKIN FREE OF INFECTION/BREAKDOWN Description: Skin to remain free of breakdown while on rehab with min assist Outcome: Progressing Goal: RH STG MAINTAIN SKIN INTEGRITY WITH ASSISTANCE Description: STG Maintain Skin Integrity With min assistance.  Outcome: Progressing   Problem: RH SAFETY Goal: RH STG ADHERE TO SAFETY PRECAUTIONS W/ASSISTANCE/DEVICE Description: STG Adhere to Safety Precautions With min Assistance/Device.  Outcome: Progressing   

## 2019-10-04 NOTE — Progress Notes (Addendum)
Physical Therapy Note  Patient Details  Name: Patricia Stanley MRN: [id number] Date of Birth: Sep 24, 1945 Today's Date: 10/04/2019    Attempted to work with patient this morning- she was laying in bed and sobbing stating she does not want to be here and she wishes she could just go to heaven, she wants to go home and not come back to this hospital. Husband present and assisted in attempting to encourage patient to participate but we were unsuccessful and she adamantly refused therapy. I will make another attempt later this morning.     Madelaine Etienne, DPT, PN1   Supplemental Physical Therapist [facility]    Pager [phone] Acute Rehab Office [phone]

## 2019-10-04 NOTE — Progress Notes (Signed)
[facility] PHYSICAL MEDICINE & REHABILITATION PROGRESS NOTE  Subjective/Complaints: Patient seen sitting up in bed this morning.  Her husband is at bedside.  She states she slept fairly overnight.  She is very tearful and states she wants to go home.  She states she hurts "all over."  Her husband does note that he has seen emotional changes with UTI.  Discussed current medical status with husband, offered chaplain.  ROS: + Shortness of breath, nausea, generalized pain.    Objective: Vital Signs: Blood pressure (!) 165/79, pulse 92, temperature 99.3 F (37.4 C), resp. rate 18, height 5\' 1"  (1.549 m), weight 42.7 kg, SpO2 95 %. No results found. Recent Labs    10/01/19 0940 10/03/19 0559  WBC 12.4* 10.3  HGB 7.8* 7.3*  HCT 24.8* 24.6*  PLT 311 305   Recent Labs    10/03/19 0559 10/04/19 0508  NA 143 141  K 3.1* 3.6  CL 109 106  CO2 27 25  GLUCOSE 103* 95  BUN 59* 58*  CREATININE 1.94* 1.95*  CALCIUM 7.9* 8.2*    Physical Exam: BP (!) 165/79 (BP Location: Left Arm)   Pulse 92   Temp 99.3 F (37.4 C)   Resp 18   Ht 5\' 1"  (1.549 m)   Wt 42.7 kg   SpO2 95%   BMI 17.79 kg/m   Constitutional: + Distressed. Vital signs reviewed.  + Cachectic HENT: Normocephalic.  Atraumatic. Eyes: EOMI. No discharge. Cardiovascular: No JVD. Respiratory: Normal effort.  No stridor.  + Deport GI: Non-distended. Skin: Warm and dry.  Intact. Psych: Normal mood.  Normal behavior. Musc:?  TTP multiple joints Neuro: Alert HOH Motor: RUE 4+/5 proximal distal LUE: Shoulder abduction, elbow flexion 4-/5, elbow extension 3+/5, distally 0/5, persistent Right lower extremity: 4+/5 proximal distal Left lower extremity: Hip flexion, knee extension 4/5, ankle dorsiflexion 2-/5, persistent  Assessment/Plan: 1. Functional deficits secondary to multiple bilateral infarcts, largest on left with ? Radiculopathy which require 3+ hours per day of interdisciplinary therapy in a comprehensive inpatient  rehab setting.  Physiatrist is providing close team supervision and 24 hour management of active medical problems listed below.  Physiatrist and rehab team continue to assess barriers to discharge/monitor patient progress toward functional and medical goals  Care Tool:  Bathing    Body parts bathed by patient: Left arm, Chest, Abdomen, Front perineal area, Right upper leg, Left upper leg, Face   Body parts bathed by helper: Right arm, Buttocks Body parts n/a: Left lower leg, Right lower leg(pt reports her husband did this morning)   Bathing assist Assist Level: Moderate Assistance - Patient 50 - 74%     Upper Body Dressing/Undressing Upper body dressing   What is the patient wearing?: Pull over shirt    Upper body assist Assist Level: Maximal Assistance - Patient 25 - 49%    Lower Body Dressing/Undressing Lower body dressing      What is the patient wearing?: Incontinence brief, Pants     Lower body assist Assist for lower body dressing: Maximal Assistance - Patient 25 - 49%     Toileting Toileting    Toileting assist Assist for toileting: Maximal Assistance - Patient 25 - 49%     Transfers Chair/bed transfer  Transfers assist  Chair/bed transfer activity did not occur: Safety/medical concerns  Chair/bed transfer assist level: Moderate Assistance - Patient 50 - 74%     Locomotion Ambulation   Ambulation assist      Assist level: Moderate Assistance - Patient  50 - 74% Assistive device: Parallel bars Max distance: 26ft   Walk 10 feet activity   Assist     Assist level: Moderate Assistance - Patient - 50 - 74% Assistive device: Walker-rolling   Walk 50 feet activity   Assist Walk 50 feet with 2 turns activity did not occur: Safety/medical concerns         Walk 150 feet activity   Assist Walk 150 feet activity did not occur: Safety/medical concerns         Walk 10 feet on uneven surface  activity   Assist Walk 10 feet on uneven  surfaces activity did not occur: Safety/medical concerns         Wheelchair     Assist Will patient use wheelchair at discharge?: Yes Type of Wheelchair: Manual    Wheelchair assist level: Moderate Assistance - Patient 50 - 74% Max wheelchair distance: 81ftx2    Wheelchair 50 feet with 2 turns activity    Assist            Wheelchair 150 feet activity     Assist            Medical Problem List and Plan: 1.  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  Left C5 cervical radiculopathy/spinal stenosis  Continue CIR   WHO/PRAFO  2.  Antithrombotics: -DVT/anticoagulation: SCDs.             -antiplatelet therapy: Aspirin 325 mg daily 3. Pain Management: Tramadol as needed  Gabapentin 100 BID started on 5/28 DC'd due to?  Cognitive side effects, will retrial at 100 daily 4. Mood: Klonopin 0.5 mg twice daily, Remeron 50 mg nightly   Chaplain to see patient             -antipsychotic agents: N/A 5. Neuropsych: This patient is capable of making decisions on her own behalf.  Will consider neuropsych after treatment of UTI 6. Skin/Wound Care: Routine skin checks 7. Fluids/Electrolytes/Nutrition: Routine in and outs 8.  Chronic normocytic anemia.    Continue iron supplement.  Followed outpatient by GI services.  Not a candidate for colonoscopy or endoscopy  Hemoglobin 7.3 on 5/31, labs ordered for tomorrow  Continue to monitor 9.  Chronic diarrhea  Cont Creon.  Followed outpatient by GI services.   Started Miralax daily   Lactulose prn  KUB personally reviewed, showing constipation  Improving 10.  Unintentional weight loss.  CT abdomen pelvis unremarkable.  Dietary follow-up  Per husband she was evaluated by oncology prior to admission with negative work-up  Daily weights Filed Weights    09/29/19 0500 09/30/19 0500 10/01/19 0500  Weight: 39.1 kg 41.9 kg 42.7 kg   Improving on 5/29, no repeat labs 11.  Hyperlipidemia.  Lipitor 12.  GERD.  Protonix 13.  Supplemental oxygen dependent   Wean as tolerated, continues to require on 6/1 14.  Hypertension  Lisinopril 2.5 started on 5/27- d/c due to AKI   Tenormin resumed on 5/29   Vitals:   10/03/19 1925 10/04/19 0259  BP: (!) 141/63 (!) 165/79  Pulse: 82 92  Resp: 18   Temp: 98.1 F (36.7 C) 99.3 F (37.4 C)  SpO2: 97% 95%   Labile on 6/1 15.  Prediabetes  Relatively controlled on 5/31  Monitor with increased mobility 16.  Leukocytosis: Resolved  WBC 10.3 on 5/31  Afebrile  Continue to monitor  17.  AKI  Creatinine 1.95 on 6/1, labs ordered for tomorrow  Encourage fluids  Echo reviewed with EF 55 to 60%, IVF started on 6/1  Continue to monitor 18.  Severe hypoalbuminemia with malnutrition  Supplement increased on 5/26 19.  Transaminitis  AST elevated, but improving on 5/27  Continue monitor 20.  Acute lower UTI  Encephalopathy appears resolved  UA +, urine culture showing Enterococcus  IV empiric Rocephin changed to Keflex, discussed with pharmacy 21.  Potassium  3.6 on 6/1  Supplemented x1 day on 5/31  Greater than 35 minutes spent in total in reviewing labs, discussion with pharmacy, discussing medications and current medical status with patient and husband as well as medical decision making regarding electrolytes, UTI, blood pressure, pain  LOS: 7 days A FACE TO FACE EVALUATION WAS PERFORMED  Jaeanna Mccomber Lorie Phenix 10/04/2019, 8:42 AM

## 2019-10-05 ENCOUNTER — Inpatient Hospital Stay (HOSPITAL_COMMUNITY): Payer: Medicare HMO

## 2019-10-05 ENCOUNTER — Inpatient Hospital Stay (HOSPITAL_COMMUNITY)
Admission: AD | Admit: 2019-10-05 | Discharge: 2019-11-03 | DRG: 871 | Disposition: E | Payer: Medicare HMO | Source: Other Acute Inpatient Hospital | Attending: Student | Admitting: Student

## 2019-10-05 ENCOUNTER — Inpatient Hospital Stay (HOSPITAL_COMMUNITY): Payer: Medicare HMO | Admitting: Occupational Therapy

## 2019-10-05 ENCOUNTER — Inpatient Hospital Stay (HOSPITAL_COMMUNITY): Payer: Medicare HMO | Admitting: Physical Therapy

## 2019-10-05 ENCOUNTER — Inpatient Hospital Stay (HOSPITAL_COMMUNITY): Payer: Medicare HMO | Admitting: Speech Pathology

## 2019-10-05 DIAGNOSIS — Z7401 Bed confinement status: Secondary | ICD-10-CM

## 2019-10-05 DIAGNOSIS — Z7189 Other specified counseling: Secondary | ICD-10-CM | POA: Diagnosis not present

## 2019-10-05 DIAGNOSIS — A419 Sepsis, unspecified organism: Secondary | ICD-10-CM | POA: Diagnosis not present

## 2019-10-05 DIAGNOSIS — N1831 Chronic kidney disease, stage 3a: Secondary | ICD-10-CM | POA: Diagnosis present

## 2019-10-05 DIAGNOSIS — Z8249 Family history of ischemic heart disease and other diseases of the circulatory system: Secondary | ICD-10-CM

## 2019-10-05 DIAGNOSIS — R0902 Hypoxemia: Secondary | ICD-10-CM | POA: Diagnosis not present

## 2019-10-05 DIAGNOSIS — Z66 Do not resuscitate: Secondary | ICD-10-CM | POA: Diagnosis not present

## 2019-10-05 DIAGNOSIS — Z681 Body mass index (BMI) 19 or less, adult: Secondary | ICD-10-CM

## 2019-10-05 DIAGNOSIS — K5900 Constipation, unspecified: Secondary | ICD-10-CM | POA: Diagnosis present

## 2019-10-05 DIAGNOSIS — Z20822 Contact with and (suspected) exposure to covid-19: Secondary | ICD-10-CM | POA: Diagnosis not present

## 2019-10-05 DIAGNOSIS — Y95 Nosocomial condition: Secondary | ICD-10-CM | POA: Diagnosis present

## 2019-10-05 DIAGNOSIS — Z7982 Long term (current) use of aspirin: Secondary | ICD-10-CM

## 2019-10-05 DIAGNOSIS — R5381 Other malaise: Secondary | ICD-10-CM | POA: Diagnosis present

## 2019-10-05 DIAGNOSIS — Z9049 Acquired absence of other specified parts of digestive tract: Secondary | ICD-10-CM

## 2019-10-05 DIAGNOSIS — N179 Acute kidney failure, unspecified: Secondary | ICD-10-CM | POA: Diagnosis not present

## 2019-10-05 DIAGNOSIS — I6302 Cerebral infarction due to thrombosis of basilar artery: Secondary | ICD-10-CM | POA: Diagnosis not present

## 2019-10-05 DIAGNOSIS — B952 Enterococcus as the cause of diseases classified elsewhere: Secondary | ICD-10-CM | POA: Diagnosis present

## 2019-10-05 DIAGNOSIS — Z79899 Other long term (current) drug therapy: Secondary | ICD-10-CM

## 2019-10-05 DIAGNOSIS — D72829 Elevated white blood cell count, unspecified: Secondary | ICD-10-CM | POA: Diagnosis not present

## 2019-10-05 DIAGNOSIS — J690 Pneumonitis due to inhalation of food and vomit: Secondary | ICD-10-CM | POA: Diagnosis not present

## 2019-10-05 DIAGNOSIS — I69322 Dysarthria following cerebral infarction: Secondary | ICD-10-CM | POA: Diagnosis not present

## 2019-10-05 DIAGNOSIS — E860 Dehydration: Secondary | ICD-10-CM | POA: Diagnosis present

## 2019-10-05 DIAGNOSIS — K529 Noninfective gastroenteritis and colitis, unspecified: Secondary | ICD-10-CM | POA: Diagnosis present

## 2019-10-05 DIAGNOSIS — I6521 Occlusion and stenosis of right carotid artery: Secondary | ICD-10-CM | POA: Diagnosis present

## 2019-10-05 DIAGNOSIS — G9349 Other encephalopathy: Secondary | ICD-10-CM | POA: Diagnosis not present

## 2019-10-05 DIAGNOSIS — Z87442 Personal history of urinary calculi: Secondary | ICD-10-CM

## 2019-10-05 DIAGNOSIS — I248 Other forms of acute ischemic heart disease: Secondary | ICD-10-CM | POA: Diagnosis present

## 2019-10-05 DIAGNOSIS — R0989 Other specified symptoms and signs involving the circulatory and respiratory systems: Secondary | ICD-10-CM | POA: Diagnosis not present

## 2019-10-05 DIAGNOSIS — R4182 Altered mental status, unspecified: Secondary | ICD-10-CM | POA: Diagnosis not present

## 2019-10-05 DIAGNOSIS — E8809 Other disorders of plasma-protein metabolism, not elsewhere classified: Secondary | ICD-10-CM | POA: Diagnosis present

## 2019-10-05 DIAGNOSIS — I639 Cerebral infarction, unspecified: Secondary | ICD-10-CM | POA: Diagnosis present

## 2019-10-05 DIAGNOSIS — I130 Hypertensive heart and chronic kidney disease with heart failure and stage 1 through stage 4 chronic kidney disease, or unspecified chronic kidney disease: Secondary | ICD-10-CM | POA: Diagnosis present

## 2019-10-05 DIAGNOSIS — N390 Urinary tract infection, site not specified: Secondary | ICD-10-CM | POA: Diagnosis present

## 2019-10-05 DIAGNOSIS — Z515 Encounter for palliative care: Secondary | ICD-10-CM | POA: Diagnosis not present

## 2019-10-05 DIAGNOSIS — R778 Other specified abnormalities of plasma proteins: Secondary | ICD-10-CM

## 2019-10-05 DIAGNOSIS — K219 Gastro-esophageal reflux disease without esophagitis: Secondary | ICD-10-CM | POA: Diagnosis present

## 2019-10-05 DIAGNOSIS — Z90710 Acquired absence of both cervix and uterus: Secondary | ICD-10-CM

## 2019-10-05 DIAGNOSIS — I69354 Hemiplegia and hemiparesis following cerebral infarction affecting left non-dominant side: Secondary | ICD-10-CM

## 2019-10-05 DIAGNOSIS — F329 Major depressive disorder, single episode, unspecified: Secondary | ICD-10-CM | POA: Diagnosis present

## 2019-10-05 DIAGNOSIS — I10 Essential (primary) hypertension: Secondary | ICD-10-CM | POA: Diagnosis present

## 2019-10-05 DIAGNOSIS — J189 Pneumonia, unspecified organism: Secondary | ICD-10-CM | POA: Diagnosis present

## 2019-10-05 DIAGNOSIS — R6520 Severe sepsis without septic shock: Secondary | ICD-10-CM | POA: Diagnosis present

## 2019-10-05 DIAGNOSIS — E8729 Other acidosis: Secondary | ICD-10-CM | POA: Diagnosis present

## 2019-10-05 DIAGNOSIS — R0603 Acute respiratory distress: Secondary | ICD-10-CM | POA: Diagnosis not present

## 2019-10-05 DIAGNOSIS — G9341 Metabolic encephalopathy: Secondary | ICD-10-CM | POA: Diagnosis not present

## 2019-10-05 DIAGNOSIS — J9601 Acute respiratory failure with hypoxia: Secondary | ICD-10-CM | POA: Diagnosis not present

## 2019-10-05 DIAGNOSIS — I5033 Acute on chronic diastolic (congestive) heart failure: Secondary | ICD-10-CM | POA: Diagnosis not present

## 2019-10-05 DIAGNOSIS — Z83438 Family history of other disorder of lipoprotein metabolism and other lipidemia: Secondary | ICD-10-CM

## 2019-10-05 DIAGNOSIS — Z87891 Personal history of nicotine dependence: Secondary | ICD-10-CM

## 2019-10-05 DIAGNOSIS — R7303 Prediabetes: Secondary | ICD-10-CM | POA: Diagnosis not present

## 2019-10-05 DIAGNOSIS — D649 Anemia, unspecified: Secondary | ICD-10-CM | POA: Diagnosis present

## 2019-10-05 DIAGNOSIS — E440 Moderate protein-calorie malnutrition: Secondary | ICD-10-CM | POA: Diagnosis present

## 2019-10-05 DIAGNOSIS — E872 Acidosis: Secondary | ICD-10-CM | POA: Diagnosis present

## 2019-10-05 DIAGNOSIS — R64 Cachexia: Secondary | ICD-10-CM | POA: Diagnosis not present

## 2019-10-05 DIAGNOSIS — D638 Anemia in other chronic diseases classified elsewhere: Secondary | ICD-10-CM | POA: Diagnosis not present

## 2019-10-05 DIAGNOSIS — D631 Anemia in chronic kidney disease: Secondary | ICD-10-CM | POA: Diagnosis present

## 2019-10-05 DIAGNOSIS — Z789 Other specified health status: Secondary | ICD-10-CM | POA: Diagnosis not present

## 2019-10-05 LAB — BLOOD GAS, ARTERIAL
Acid-base deficit: 5.6 mmol/L — ABNORMAL HIGH (ref 0.0–2.0)
Acid-base deficit: 4.8 mmol/L — ABNORMAL HIGH (ref 0.0–2.0)
Bicarbonate: 17.9 mmol/L — ABNORMAL LOW (ref 20.0–28.0)
Bicarbonate: 19 mmol/L — ABNORMAL LOW (ref 20.0–28.0)
Drawn by: 511471
FIO2: 100
FIO2: 100
O2 Saturation: 90.6 %
O2 Saturation: 99.8 %
Patient temperature: 38.8
Patient temperature: 36.3
pCO2 arterial: 29.2 mmHg — ABNORMAL LOW (ref 32.0–48.0)
pCO2 arterial: 29.3 mmHg — ABNORMAL LOW (ref 32.0–48.0)
pH, Arterial: 7.412 (ref 7.350–7.450)
pH, Arterial: 7.424 (ref 7.350–7.450)
pO2, Arterial: 68.1 mmHg — ABNORMAL LOW (ref 83.0–108.0)
pO2, Arterial: 218 mmHg — ABNORMAL HIGH (ref 83.0–108.0)

## 2019-10-05 LAB — CBC WITH DIFFERENTIAL/PLATELET
Abs Immature Granulocytes: 0.14 10*3/uL — ABNORMAL HIGH (ref 0.00–0.07)
Abs Immature Granulocytes: 0.2 10*3/uL — ABNORMAL HIGH (ref 0.00–0.07)
Basophils Absolute: 0.1 10*3/uL (ref 0.0–0.1)
Basophils Absolute: 0 10*3/uL (ref 0.0–0.1)
Basophils Relative: 0 %
Basophils Relative: 0 %
Eosinophils Absolute: 0 10*3/uL (ref 0.0–0.5)
Eosinophils Absolute: 0.2 10*3/uL (ref 0.0–0.5)
Eosinophils Relative: 0 %
Eosinophils Relative: 1 %
HCT: 23.6 % — ABNORMAL LOW (ref 36.0–46.0)
HCT: 25.4 % — ABNORMAL LOW (ref 36.0–46.0)
Hemoglobin: 7.1 g/dL — ABNORMAL LOW (ref 12.0–15.0)
Hemoglobin: 7.6 g/dL — ABNORMAL LOW (ref 12.0–15.0)
Immature Granulocytes: 1 %
Lymphocytes Relative: 9 %
Lymphocytes Relative: 5 %
Lymphs Abs: 1.4 10*3/uL (ref 0.7–4.0)
Lymphs Abs: 1 10*3/uL (ref 0.7–4.0)
MCH: 28.4 pg (ref 26.0–34.0)
MCH: 28.6 pg (ref 26.0–34.0)
MCHC: 30.1 g/dL (ref 30.0–36.0)
MCHC: 29.9 g/dL — ABNORMAL LOW (ref 30.0–36.0)
MCV: 94.4 fL (ref 80.0–100.0)
MCV: 95.5 fL (ref 80.0–100.0)
Monocytes Absolute: 0.8 10*3/uL (ref 0.1–1.0)
Monocytes Absolute: 0 10*3/uL — ABNORMAL LOW (ref 0.1–1.0)
Monocytes Relative: 6 %
Monocytes Relative: 0 %
Myelocytes: 1 %
Neutro Abs: 12.9 10*3/uL — ABNORMAL HIGH (ref 1.7–7.7)
Neutro Abs: 18 10*3/uL — ABNORMAL HIGH (ref 1.7–7.7)
Neutrophils Relative %: 84 %
Neutrophils Relative %: 93 %
Platelets: 375 10*3/uL (ref 150–400)
Platelets: 496 10*3/uL — ABNORMAL HIGH (ref 150–400)
RBC: 2.5 MIL/uL — ABNORMAL LOW (ref 3.87–5.11)
RBC: 2.66 MIL/uL — ABNORMAL LOW (ref 3.87–5.11)
RDW: 22.2 % — ABNORMAL HIGH (ref 11.5–15.5)
RDW: 22.6 % — ABNORMAL HIGH (ref 11.5–15.5)
WBC: 15.3 10*3/uL — ABNORMAL HIGH (ref 4.0–10.5)
WBC: 19.4 10*3/uL — ABNORMAL HIGH (ref 4.0–10.5)
nRBC: 0 % (ref 0.0–0.2)
nRBC: 0 % (ref 0.0–0.2)
nRBC: 0 /100 WBC

## 2019-10-05 LAB — URINALYSIS, ROUTINE W REFLEX MICROSCOPIC
Bilirubin Urine: NEGATIVE
Cellular Cast, UA: 11
Glucose, UA: NEGATIVE mg/dL
Ketones, ur: NEGATIVE mg/dL
Nitrite: NEGATIVE
Protein, ur: >=300 mg/dL — AB
RBC / HPF: >50 RBC/hpf — ABNORMAL HIGH (ref 0–5)
Specific Gravity, Urine: 1.018 (ref 1.005–1.030)
WBC, UA: >50 WBC/hpf — ABNORMAL HIGH (ref 0–5)
pH: 5 (ref 5.0–8.0)

## 2019-10-05 LAB — PATHOLOGIST SMEAR REVIEW

## 2019-10-05 LAB — BASIC METABOLIC PANEL
Anion gap: 8 (ref 5–15)
Anion gap: 18 — ABNORMAL HIGH (ref 5–15)
BUN: 67 mg/dL — ABNORMAL HIGH (ref 8–23)
BUN: 80 mg/dL — ABNORMAL HIGH (ref 8–23)
CO2: 23 mmol/L (ref 22–32)
CO2: 16 mmol/L — ABNORMAL LOW (ref 22–32)
Calcium: 8 mg/dL — ABNORMAL LOW (ref 8.9–10.3)
Calcium: 8.2 mg/dL — ABNORMAL LOW (ref 8.9–10.3)
Chloride: 110 mmol/L (ref 98–111)
Chloride: 110 mmol/L (ref 98–111)
Creatinine, Ser: 2.15 mg/dL — ABNORMAL HIGH (ref 0.44–1.00)
Creatinine, Ser: 2.45 mg/dL — ABNORMAL HIGH (ref 0.44–1.00)
GFR calc Af Amer: 26 mL/min — ABNORMAL LOW (ref >60)
GFR calc Af Amer: 22 mL/min — ABNORMAL LOW (ref >60)
GFR calc non Af Amer: 22 mL/min — ABNORMAL LOW (ref >60)
GFR calc non Af Amer: 19 mL/min — ABNORMAL LOW (ref >60)
Glucose, Bld: 122 mg/dL — ABNORMAL HIGH (ref 70–99)
Glucose, Bld: 169 mg/dL — ABNORMAL HIGH (ref 70–99)
Potassium: 4.1 mmol/L (ref 3.5–5.1)
Potassium: 4.2 mmol/L (ref 3.5–5.1)
Sodium: 141 mmol/L (ref 135–145)
Sodium: 144 mmol/L (ref 135–145)

## 2019-10-05 LAB — LACTIC ACID, PLASMA: Lactic Acid, Venous: 5.4 mmol/L (ref 0.5–1.9)

## 2019-10-05 MED ORDER — SODIUM CHLORIDE 0.9 % IV SOLN: INTRAVENOUS | Status: DC

## 2019-10-05 MED ORDER — POLYETHYLENE GLYCOL 3350 17 G PO PACK: 17.0000 g | PACK | Freq: Every day | ORAL | 0 refills | Status: AC

## 2019-10-05 MED ORDER — PIPERACILLIN-TAZOBACTAM 3.375 G IVPB
3.3750 g | Freq: Two times a day (BID) | INTRAVENOUS | Status: DC
  Administered 2019-10-05 – 2019-10-06 (×2): 3.375 g via INTRAVENOUS
  Filled 2019-10-05: qty 50

## 2019-10-05 MED ORDER — VANCOMYCIN HCL 750 MG/150ML IV SOLN
750.0000 mg | Freq: Once | INTRAVENOUS | Status: AC
  Administered 2019-10-05: 750 mg via INTRAVENOUS
  Filled 2019-10-05: qty 150

## 2019-10-05 MED ORDER — PRO-STAT SUGAR FREE PO LIQD
60.0000 mL | Freq: Three times a day (TID) | ORAL | Status: DC
  Administered 2019-10-05: 60 mL via ORAL
  Filled 2019-10-05: qty 60

## 2019-10-05 MED ORDER — SODIUM CHLORIDE 0.9 % IV BOLUS: 500.0000 mL | Freq: Once | INTRAVENOUS | Status: DC

## 2019-10-05 MED ORDER — ADULT MULTIVITAMIN W/MINERALS CH
1.0000 | ORAL_TABLET | Freq: Every day | ORAL | Status: DC
  Administered 2019-10-06: 1 via ORAL
  Filled 2019-10-05: qty 1

## 2019-10-05 MED ORDER — AMOXICILLIN 500 MG PO CAPS: 500.0000 mg | ORAL_CAPSULE | Freq: Two times a day (BID) | ORAL | Status: AC

## 2019-10-05 MED ORDER — PANTOPRAZOLE SODIUM 40 MG PO TBEC
40.0000 mg | DELAYED_RELEASE_TABLET | Freq: Two times a day (BID) | ORAL | Status: DC
  Administered 2019-10-06: 40 mg via ORAL
  Filled 2019-10-05: qty 1

## 2019-10-05 MED ORDER — ATENOLOL 25 MG PO TABS: 25.0000 mg | ORAL_TABLET | Freq: Every day | ORAL | Status: AC

## 2019-10-05 MED ORDER — TRAMADOL HCL 50 MG PO TABS: 50.0000 mg | ORAL_TABLET | Freq: Four times a day (QID) | ORAL | Status: AC | PRN

## 2019-10-05 MED ORDER — SODIUM CHLORIDE 0.9 % IV BOLUS: 1000.0000 mL | Freq: Once | INTRAVENOUS | Status: DC

## 2019-10-05 MED ORDER — SODIUM CHLORIDE 0.9 % IV BOLUS
1500.0000 mL | Freq: Once | INTRAVENOUS | Status: AC
  Administered 2019-10-05: 1500 mL via INTRAVENOUS

## 2019-10-05 MED ORDER — HEPARIN SODIUM (PORCINE) 5000 UNIT/ML IJ SOLN
5000.0000 [IU] | Freq: Three times a day (TID) | INTRAMUSCULAR | Status: DC
  Administered 2019-10-05 – 2019-10-06 (×2): 5000 [IU] via SUBCUTANEOUS
  Filled 2019-10-05 (×2): qty 1

## 2019-10-05 MED ORDER — CLONAZEPAM 0.5 MG PO TABS: 0.2500 mg | ORAL_TABLET | Freq: Two times a day (BID) | ORAL | Status: DC

## 2019-10-05 MED ORDER — ENSURE ENLIVE PO LIQD: 237.0000 mL | Freq: Four times a day (QID) | ORAL | Status: DC

## 2019-10-05 MED ORDER — LACTULOSE 10 GM/15ML PO SOLN: 20.0000 g | Freq: Two times a day (BID) | ORAL | 0 refills | Status: AC | PRN

## 2019-10-05 MED ORDER — SODIUM CHLORIDE 0.9 % IV SOLN: 100.0000 mL | INTRAVENOUS | 0 refills | Status: AC

## 2019-10-05 MED ORDER — ATORVASTATIN CALCIUM 40 MG PO TABS: 40.0000 mg | ORAL_TABLET | Freq: Every day | ORAL | Status: AC

## 2019-10-05 MED ORDER — ATORVASTATIN CALCIUM 40 MG PO TABS: 40.0000 mg | ORAL_TABLET | Freq: Every day | ORAL | Status: DC

## 2019-10-05 MED ORDER — ASPIRIN 325 MG PO TABS
325.0000 mg | ORAL_TABLET | Freq: Every day | ORAL | Status: DC
  Administered 2019-10-06: 325 mg via ORAL
  Filled 2019-10-05: qty 1

## 2019-10-05 MED ORDER — FOLIC ACID 1 MG PO TABS
1.0000 mg | ORAL_TABLET | Freq: Every day | ORAL | Status: DC
  Administered 2019-10-06: 1 mg via ORAL
  Filled 2019-10-05: qty 1

## 2019-10-05 MED ORDER — ADULT MULTIVITAMIN W/MINERALS CH: 1.0000 | ORAL_TABLET | Freq: Every day | ORAL | Status: AC

## 2019-10-05 MED ORDER — ACETAMINOPHEN 325 MG PO TABS: 650.0000 mg | ORAL_TABLET | ORAL | Status: AC | PRN

## 2019-10-05 MED ORDER — ACETAMINOPHEN 650 MG RE SUPP: 650.0000 mg | Freq: Four times a day (QID) | RECTAL | Status: DC | PRN

## 2019-10-05 MED ORDER — CLONAZEPAM 0.5 MG PO TABS: 0.5000 mg | ORAL_TABLET | Freq: Two times a day (BID) | ORAL | 0 refills | Status: AC

## 2019-10-05 MED ORDER — ACETAMINOPHEN 325 MG PO TABS: 650.0000 mg | ORAL_TABLET | Freq: Four times a day (QID) | ORAL | Status: DC | PRN

## 2019-10-05 MED ORDER — FOLIC ACID 1 MG PO TABS: 1.0000 mg | ORAL_TABLET | Freq: Every day | ORAL | Status: AC

## 2019-10-05 MED ORDER — FERROUS SULFATE 325 (65 FE) MG PO TABS
325.0000 mg | ORAL_TABLET | Freq: Two times a day (BID) | ORAL | Status: DC
  Administered 2019-10-06: 325 mg via ORAL
  Filled 2019-10-05: qty 1

## 2019-10-05 MED ORDER — VANCOMYCIN VARIABLE DOSE PER UNSTABLE RENAL FUNCTION (PHARMACIST DOSING): Status: DC

## 2019-10-05 MED ORDER — AMLODIPINE BESYLATE 2.5 MG PO TABS: 2.5000 mg | ORAL_TABLET | Freq: Every day | ORAL | Status: AC

## 2019-10-05 MED ORDER — MIRTAZAPINE 15 MG PO TABS
15.0000 mg | ORAL_TABLET | Freq: Every day | ORAL | Status: DC
  Administered 2019-10-05: 15 mg via ORAL
  Filled 2019-10-05: qty 1

## 2019-10-05 MED ORDER — CLONAZEPAM 0.5 MG PO TABS
0.5000 mg | ORAL_TABLET | Freq: Two times a day (BID) | ORAL | Status: DC
  Administered 2019-10-05 – 2019-10-06 (×2): 0.5 mg via ORAL
  Filled 2019-10-05 (×2): qty 1

## 2019-10-05 NOTE — Progress Notes (Signed)
Occupational Therapy Session Note  Patient Details  Name: Patricia Stanley MRN: [id number] Date of Birth: April 16, 1946  Today's Date: 10/10/2019 OT Individual Time: 0920-1005 OT Individual Time Calculation (min): 45 min    Short Term Goals: Week 1:  OT Short Term Goal 1 (Week 1): Pt donn/doff shirt and bra with mod assist. OT Short Term Goal 2 (Week 1): Pt will complete oral hygiene sitting sinkside and using hemitechnique with supervision. OT Short Term Goal 3 (Week 1): Pt will complete BSC transfer with min assist. OT Short Term Goal 4 (Week 1): Pt will bathe UB/LB with mod assist using long handled sponge.  Skilled Therapeutic Interventions/Progress Updates:    Pt received in bed sleeping, but awoke easily.  She was lethargic and demonstrating confusion about where she was and asking for her husband. Pt's brief soaked with urine, encouraged pt to try to sit on BSC. Needed MAX A with bed mobility, squat pivot transfer to [facility]. Pt sat for 10 min with c/o pain from sitting.  Unable to void but did have pt do an UB sponge bath with mod A and donned shirt max.   Pt then stated she was seeing stars, so transferred back to bed. Blood pressure 123/ 63.  LB bathing and dressing performed in bed with total A.  TED hose placed on patient. She did have a very small amount of black tarry stool, nursing assisted with cleansing and changing of pad.   Spouse arrived at end of session. He expressed his concerns with her confusion, lack of energy, body aches, etc.   Pt did perk up quite a bit when he arrived and started smiling.   Pt resting in bed with all needs met.  Alarm set.   Therapy Documentation Precautions:  Precautions Precautions: Fall Precaution Comments: L side weakness, global weakness Restrictions Weight Bearing Restrictions: No    Vital Signs: Therapy Vitals Pulse Rate: 93 BP: 134/76 Patient Position (if appropriate): Lying Oxygen Therapy SpO2: 90 % O2 Device: Nasal Cannula O2  Flow Rate (L/min): 2 L/min Pain: Pain Assessment Pain Scale: 0-10 Pain Score: 0-No pain Faces Pain Scale: Hurts whole lot Pain Type: Chronic pain Pain Location: Leg Pain Orientation: Right;Left Pain Descriptors / Indicators: Aching Pain Onset: With Activity Pain Intervention(s): Repositioned    Therapy/Group: Individual Therapy  Garnett Nunziata 10/24/2019, 11:15 AM

## 2019-10-05 NOTE — Plan of Care (Signed)
  Problem: Consults Goal: RH STROKE PATIENT EDUCATION Description: See Patient Education module for education specifics  Outcome: Progressing   Problem: RH BOWEL ELIMINATION Goal: RH STG MANAGE BOWEL WITH ASSISTANCE Description: STG Manage Bowel with min Assistance.  Outcome: Progressing Goal: RH STG MANAGE BOWEL W/MEDICATION W/ASSISTANCE Description: STG Manage Bowel with Medication with min Assistance.  Outcome: Progressing   Problem: RH BLADDER ELIMINATION Goal: RH STG MANAGE BLADDER WITH ASSISTANCE Description: STG Manage Bladder With min Assistance  Outcome: Progressing   Problem: RH SKIN INTEGRITY Goal: RH STG SKIN FREE OF INFECTION/BREAKDOWN Description: Skin to remain free of breakdown while on rehab with min assist Outcome: Progressing Goal: RH STG MAINTAIN SKIN INTEGRITY WITH ASSISTANCE Description: STG Maintain Skin Integrity With min assistance.  Outcome: Progressing   Problem: RH SAFETY Goal: RH STG ADHERE TO SAFETY PRECAUTIONS W/ASSISTANCE/DEVICE Description: STG Adhere to Safety Precautions With min Assistance/Device.  Outcome: Progressing

## 2019-10-05 NOTE — Progress Notes (Signed)
Pt noted with O2 sat of low 40's and upper 30's around 1830. Pt is responds to voice, knew she was in hospital and she is sick. Daughter stated pt was cold and she had heating pad on her to warm her up. Rectal temp of 101.9, pt shaking and respiratory rate of 34-38. Rapid response contacted. Placed on non-rebreather mask, then on BIPAP. Transferred to 4E around 2110 report given to receiving RN. See RR nurse for details.

## 2019-10-05 NOTE — Progress Notes (Addendum)
Pharmacy Antibiotic Note  Patricia Stanley is a 74 y.o. female patient of CIR (transferring back to acute side) with L-sided weakness with decreased functional mobility secondary to multifocal punctate anterior and posterior infarcts on 09/20/2019 as well as history of CVA December 2020.  Pt's WBC is up to 19.4 this evening; pt is febrile and has AMS, increased RR and hypoxia; lactic acid 5.4. Pharmacy has been consulted for vancomycin and Zosyn dosing for pneumonia.  WBC 19.4, temp 101.9 F, Scr 2.45, CrCl 13.8 ml/min (Scr rising, renal function unstable)  Pt is currently on amoxicillin PO for treatment of enterococcal UTI (will d/c since starting Zosyn and vancomycin, as pathogen is susceptible to these antibiotics).  Plan: Zosyn 3.375 gm IV Q 12 hrs Vancomycin 750 mg IV X 1; given pt's unstable, worsening renal function, will dose vancomycin using levels at this point; will check vancomycin random level, Scr in ~12 hrs to evaluate renal function trend and vancomycin clearance and determine subsequent dosing strategy Monitor WBC, temp, cultures, renal function, vancomycin levels   Temp (24hrs), Avg:98.3 F (36.8 C), Min:98.2 F (36.8 C), Max:98.4 F (36.9 C)  Recent Labs  Lab 09/30/19 0636 09/30/19 0636 10/01/19 0940 10/01/19 0940 10/01/19 1248 10/02/19 0554 10/03/19 0559 10/04/19 0508 10/27/2019 0851 10/17/2019 1935  WBC 13.8*  --  12.4*  --   --   --  10.3  --  15.3* 19.4*  CREATININE 1.49*   < > 1.76*   < >  --  1.80* 1.94* 1.95* 2.15* 2.45*  LATICACIDVEN  --   --  1.2  --  1.7  --   --   --   --  5.4*   < > = values in this interval not displayed.    Estimated Creatinine Clearance: 13.8 mL/min (A) (by C-G formula based on SCr of 2.45 mg/dL (H)).    Allergies  Allergen Reactions  . Hydralazine Hcl     Lupus type reaction - DO NOT USE   . Antihistamines, Chlorpheniramine-Type Other (See Comments)    Makes her heart race  . Paroxetine Other (See Comments)    Shakes-tremors     Antimicrobials this admission: 5/29-5/31 Ceftriaxone 5/31-6/1 Keflex 6/1-6/2 Amoxicillin  Microbiology results: 5/29 BCx X 2: NGTD 5/29 UCx: >100,000 colonies/ml Enterococcus faecalis, suscept to ampicillin, vancomycin 6/2 Urine cx: pending  Thank you for allowing pharmacy to be a part of this patient's care.  Vicki Mallet, PharmD, BCPS, [facility] Clinical Pharmacist 10/21/2019 9:20 PM

## 2019-10-05 NOTE — Significant Event (Addendum)
Rapid Response Event Note  Received a call from nursing staff with concerns of patient having altered mental status, increased RR, and hypoxia. Upon arrival, patient had rectal temp of 101.9, saturations once pulse ox sensor was switched out was 40% with good waveform and confirmed with another pulse ox reading. SBP 140-160s with HR 100-105. Patient placed on NRB 15L. + Mottling of skin, increased work of breathing, and rigors/chills. Lung sounds - diminished in the bases. Saturations improved on NRB 15L 100%. Rehab PA notified. Night RRRN to take over. See notes.   Call Time 1855  End Time 2025   Reema Chick R

## 2019-10-05 NOTE — Progress Notes (Signed)
Speech Language Pathology Weekly Progress and Session Note  Patient Details  Name: Patricia Stanley MRN: [id number] Date of Birth: [birth year]  Beginning of progress report period: Sep 28, 2019 End of progress report period: October 05, 2019  Today's Date: 10/14/2019 SLP Individual Time: 9735-3299 SLP Individual Time Calculation (min): 42 min  Short Term Goals: Week 1: SLP Short Term Goal 1 (Week 1): Pt will demonstrated sustained attention in functional tasks in 10 minute intervals with min A verbal cues for redirection. SLP Short Term Goal 1 - Progress (Week 1): Met SLP Short Term Goal 2 (Week 1): Pt will demonstrate recall of novel and daily information with use of visual aid (memory notebook) given min A verbal cues. SLP Short Term Goal 2 - Progress (Week 1): Progressing toward goal SLP Short Term Goal 3 (Week 1): Pt will complete basic problem solving tasks with min A verbal cues. SLP Short Term Goal 3 - Progress (Week 1): Progressing toward goal SLP Short Term Goal 4 (Week 1): Pt will self-monitor and self-correct functional errors during basic problem solving tasks with min A verbal cues. SLP Short Term Goal 4 - Progress (Week 1): Progressing toward goal    New Short Term Goals: Week 2: SLP Short Term Goal 1 (Week 2): Pt will sustain attention to functional tasks in 10 minute intervals with Supervison A verbal cues for redirection. SLP Short Term Goal 2 (Week 2): Pt will demonstrate recall of novel and daily information with use of visual aid (ex: memory notebook) given Mod A verbal cues. SLP Short Term Goal 3 (Week 2): Pt will demonstrate ability to problem solve basic tasks with Mod A verbal/visual cues. SLP Short Term Goal 4 (Week 2): Pt will self-monitor and self-correct functional errors during basic problem solving tasks with Mod A verbal cues.  Weekly Progress Updates: Pt has made slow and minimal functional gains this reporting period due to medical instability that ultimately  lead to increased lethargy, confusion, and limited participation. As a result, she met 1 out of 4 short term goals. Pt is currently Mod-Max assist for basic tasks due to cognitive impairments impacting her short term memory, problem solving, sustained attention, and emergent awareness. Pt has demonstrated improved sustained attention to tasks when fully alert. Pt education is ongoing. Pt would continue to benefit from skilled ST while inpatient in order to maximize functional independence and reduce burden of care prior to discharge. Anticipate that pt will need 24/7 supervision at discharge in addition to [place] follow up at next level of care.       Intensity: Minumum of 1-2 x/day, 30 to 90 minutes Frequency: 1 to 3 out of 7 days Duration/Length of Stay: 2-2.5 weeks Treatment/Interventions: Cognitive remediation/compensation;Cueing hierarchy;Functional tasks;Patient/family education;Internal/external aids   Daily Session: Skilled Therapeutic Interventions: Pt was seen for skilled ST targeting cognition. Pt presented with increased confusion this morning in comparison to previous encounters with this SLP and increasing lethargy noted throughout session. Pt only oriented to self, requiring Max-Total A for orientation to month, place, situation and recall of this information within 2-3 minute intervals. Majority of session spent reorienting pt and recalling daily information with Max A for use of external aids/strategies. Pt unable to participate in basic reading tasks. Mod A verbal cues required for redirection to functional tasks throughout session. Pt left laying in bed with alarm set and needs within reach. Continue per current plan of care.     Pain Pain Assessment Pain Scale: 0-10 Pain Score: 0-No pain  Therapy/Group: Individual Therapy  Arbutus Leas 10/25/2019, 6:57 AM

## 2019-10-05 NOTE — Progress Notes (Signed)
Nutrition Follow-up  DOCUMENTATION CODES:   Underweight  INTERVENTION:   Continue feeding assistance with meals  Ensure Enlive po QID, each supplement provides 350 kcal and 20 grams of protein  60 ml Pro-stat po TID, each supplement provides 100 kcals and 15 grams protein  D/c Magic cup TID with meals, each supplement provides 290 kcal and 9 grams of protein  Continue MVI daily   NUTRITION DIAGNOSIS:   Increased nutrient needs related to other (see comment)(therapies) as evidenced by estimated needs.  Ongoing.  GOAL:   Patient will meet greater than or equal to 90% of their needs  Progressing.  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 Pt received inpatient rehab services 04/25/19 to 05/10/19. PMH also includes HLD, anemia, HTN, depression, chronic diarrhea, carotid artery stenosis with CEA 12/20. Presented 09/20/19 with increased left arm weakness and numbness as well as left leg weakness with associated dyspnea and unintentional weight loss. Small focal hypodensity in the left cerebellum reflecting possible indeterminate lacunar infarction. MRI showed 8 mm acute ischemic left basal ganglia infarction. Associated petechial hemorrhage without frank hemorrhagic transformation. Multiple scattered chronic microhemorrhages involving both cerebral and cerebellar hemispheres most pronounced about the deep gray nuclei. Admitted to CIR on 5/25.  RN in room at time of visit. Pt unable to answer RD questions at this time. Pt's husband at bedside. Pt's husband states that pt has not been eating much. Also reports pt takes Pro-stat well and has variable intake of Ensure. Husband reports pt does not like [employer], dispensing. Discussed possibility of Cortrak with pt's husband given pt's poor po intake. At this time, pt's  husband does not feel that this is something his wife would want. RD will adjust supplement regimen in hopes of increasing pt's po intake.   Pt's husband states that before the pt's stroke in December 2020, she ate fairly well. For breakfast, she would either have a bacon, egg, and cheese biscuit or a bowl of oatmeal. She would skip lunch but then eat a large dinner. Dinners varied between home-cooked meals and takeout. After the stroke in December, the pt was placed on a medication that caused diarrhea and her intake subsequently reduced drastically.   PO Intake: 0-75% x last 8 recorded meals (30% average meal intake)  Labs reviewed. Medications reviewed and include: Ensure Enlive TID, 61ml Pro-stat BID, ferrous sulfate, folvite, remeron, MVI, zofran, miralax, senokot-s IVF: NS @100ml /hr   NUTRITION - FOCUSED PHYSICAL EXAM:  Deferred due to pt complaining of pain when being touched.  Diet Order:   Diet Order            Diet regular Room service appropriate? Yes with Assist; Fluid consistency: Thin  Diet effective now              EDUCATION NEEDS:   No education needs have been identified at this time  Skin:  Skin Assessment: Reviewed RN Assessment  Last BM:  6/1  Height:   Ht Readings from Last 1 Encounters:  09/27/19 5\' 1"  (1.549 m)    Weight:   Wt Readings from Last 1 Encounters:  Oct 25, 2019 42.6 kg    Ideal Body Weight:  47.7 kg  BMI:  Body mass index is 17.75 kg/m.  Estimated Nutritional Needs:   Kcal:  1400-1600  Protein:  65-80 grams  Fluid:  1.4-1.6 L   Eugene Gavia, MS, RD, LDN RD pager number and weekend/on-call pager number located in [place].

## 2019-10-05 NOTE — H&P (Addendum)
History and Physical    Patricia Stanley IRC:[id number] DOB: 02-10-1946 DOA: 10/28/2019  PCP: Marden Noble, MD  Patient coming from:: Inpatient rehab  HPI: Patricia Stanley is a 74 y.o. female with medical history significant of CVA in December 2020, carotid artery stenosis status post right CEA in December 2020, depression, GERD, kidney stones, hypertension, chronic diarrhea, chronic anemia.    Patient was admitted 09/11/2019-09/16/2019 for failure to thrive and was found to have chronic diarrhea with GI initiating Creon, cholestyramine, and Imodium.  She was felt to be too frail for endoscopic evaluation at that time.  Subsequently admitted on 09/20/2019 for left arm numbness and left leg weakness which had worsened compared to her chronic symptoms dating back to December 2020.  She was found to have multifocal punctate anterior and posterior infarcts, embolic pattern.  CTA head and neck noted patent right CEA and mild stenosis of proximal left CCA.  LDL 28, A1c 5.7.  Neurology recommended continuing aspirin 325 only and no DAPT due to anemia requiring treatment.  Her presentation was felt to be related to a hypercoagulable state from pulmonary infection/inflammation.  Patient was also diagnosed with acute hypoxic respiratory failure secondary to bronchiectasis versus atypical pneumonia versus recurrent aspiration.  She required 2 L oxygen via nasal cannula to keep her sats greater than 85% throughout most of her hospitalization.  CT angiogram done during this admission noted what appeared to be interstitial pulmonary disease and significant progression/marked changes since prior imaging in December 2020.  Pulmonology has seen the patient and SLP eval was done which noted no aspiration at the time of evaluation.  Plan was to follow-up with pulmonology in the outpatient setting with follow-up high-resolution chest CT- ?  Hydralazine associated lupus induced lung injury.  Short trial of diuresis was  administered.  Patient also had small bilateral pleural effusions which were felt to be a consequence of malnutrition with severely depressed albumin.  She received 1 unit PRBCs for anemia as hemoglobin was 7.1 at the time of presentation.  Her anemia was felt to be related to chronic disease.  Patient also had AKI which was thought to be due to a combination of DH and IV contrast as well as poor nutritional intake/dehydration.  Creatinine stabilized at 1.3 during this admission.  Patient had also endorsed approximately 20 pound weight loss over the past 6 months.  CT abdomen pelvis did not reveal any findings to explain her unintentional weight loss.  It was suspected that this was likely related to hydralazine induced lupus type reaction as family had reported symptoms of joint aches, fatigue, malar facial rash, and diffuse achy pain coincident with new initiation of hydralazine therapy.  Patient also had significantly elevated D-dimer during this hospitalization 14.6 at the time of presentation and CT angiogram was negative for PE.  Her Covid test was negative.  Bilateral lower extremity Dopplers without evidence of DVT.  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  Patient was discharged to [facility] on 5/25.  At inpatient rehab, patient was diagnosed with a UTI and urine culture growing Enterococcus.  She was initially given IV Rocephin and antibiotics were changed to Keflex and then to amoxicillin.  Today at rehab, nursing staff noted patient was more lethargic.  This evening she became acutely tachypneic and dyspneic.  She was noted to have accessory muscle use with rigors and mottling of her skin.  Hypoxic to the 40s on 2 L  supplemental oxygen when rapid response arrived and patient was placed on 15 L oxygen via nonrebreather which improved oxygen saturation.  Vital signs: Rectal temperature 101.9 F, heart rate 102, blood  pressure 162/82, respiratory rate 32 with sats 92% on 15 L nonrebreather.  Patient was seen and examined at bedside.  She was oriented to self and recognized her daughter and husband at bedside.  Initially complained of chest pain but later denied.  No additional history could be obtained from her given significant respiratory distress.  Review of Systems:  All systems reviewed and apart from history of presenting illness, are negative.  Past Medical History:  Diagnosis Date  . Closed fracture of coccyx (HCC) 2015   AFTER FALL  . Depression   . GERD (gastroesophageal reflux disease)   . Headache    MIGRAINES OCCASIONAL  . History of kidney stones    HAS NOW  . Hypertension     Past Surgical History:  Procedure Laterality Date  . ABDOMINAL HYSTERECTOMY     1 OVARY LEFT  . APPENDECTOMY  AGE 69   DONE WITH OVARY REMOVAL  . COLONOSCOPY WITH PROPOFOL N/A 12/03/2015   Procedure: COLONOSCOPY WITH PROPOFOL;  Surgeon: Charolett Bumpers, MD;  Location: [facility];  Service: Endoscopy;  Laterality: N/A;  . ENDARTERECTOMY Right 04/21/2019   Procedure: ENDARTERECTOMY CAROTID;  Surgeon: Cephus Shelling, MD;  Location: [facility];  Service: Vascular;  Laterality: Right;  . ESOPHAGOGASTRODUODENOSCOPY (EGD) WITH PROPOFOL N/A 12/03/2015   Procedure: ESOPHAGOGASTRODUODENOSCOPY (EGD) WITH PROPOFOL;  Surgeon: Charolett Bumpers, MD;  Location: [facility];  Service: Endoscopy;  Laterality: N/A;  . TONSILLECTOMY  AGE 23   AND ADENOIDS REMOVED     reports that she quit smoking about 24 years ago. She has never used smokeless tobacco. She reports that she does not drink alcohol or use drugs.  Allergies  Allergen Reactions  . Hydralazine Hcl     Lupus type reaction - DO NOT USE   . Antihistamines, Chlorpheniramine-Type Other (See Comments)    Makes her heart race  . Paroxetine Other (See Comments)    Shakes-tremors    Family History  Problem Relation Age of Onset  . Hypertension Mother   .  Hyperlipidemia Mother   . Hypertension Father   . Hyperlipidemia Father     Prior to Admission medications   Medication Sig Start Date End Date Taking? Authorizing Provider  acetaminophen (TYLENOL) 325 MG tablet Take 2 tablets (650 mg total) by mouth every 4 (four) hours as needed for mild pain (or temp > 37.5 C (99.5 F)). 10/29/19   Angiulli, Mcarthur Rossetti, PA-C  amLODipine (NORVASC) 2.5 MG tablet Take 1 tablet (2.5 mg total) by mouth daily. 10/06/19   Angiulli, Mcarthur Rossetti, PA-C  amoxicillin (AMOXIL) 500 MG capsule Take 1 capsule (500 mg total) by mouth every 12 (twelve) hours. 2019-10-29   Angiulli, Mcarthur Rossetti, PA-C  aspirin 325 MG tablet Take 1 tablet (325 mg total) by mouth daily. 04/26/19   Swayze, Ava, DO  atenolol (TENORMIN) 25 MG tablet Take 1 tablet (25 mg total) by mouth daily. 10/06/19   Angiulli, Mcarthur Rossetti, PA-C  atorvastatin (LIPITOR) 40 MG tablet Take 1 tablet (40 mg total) by mouth daily at 6 PM. 10/06/19   Angiulli, Mcarthur Rossetti, PA-C  clonazePAM (KLONOPIN) 0.5 MG tablet Take 1 tablet (0.5 mg total) by mouth 2 (two) times daily. Oct 29, 2019   Angiulli, Mcarthur Rossetti, PA-C  ferrous sulfate 325 (65 FE) MG tablet Take 1 tablet (  325 mg total) by mouth 2 (two) times daily with a meal. 09/16/19 12/15/19  [place], Alvira Philips, DO  folic acid (FOLVITE) 1 MG tablet Take 1 tablet (1 mg total) by mouth daily. 10/06/19   Angiulli, Mcarthur Rossetti, PA-C  lactulose (CHRONULAC) 10 GM/15ML solution Take 30 mLs (20 g total) by mouth 2 (two) times daily as needed for mild constipation. 2019/11/01   Angiulli, Mcarthur Rossetti, PA-C  mirtazapine (REMERON) 15 MG tablet Take 1 tablet (15 mg total) by mouth at bedtime. 09/16/19 12/15/19  [place], Eric J, DO  Multiple Vitamin (MULTIVITAMIN WITH MINERALS) TABS tablet Take 1 tablet by mouth daily. 10/06/19   Angiulli, Mcarthur Rossetti, PA-C  pantoprazole (PROTONIX) 40 MG tablet Take 1 tablet (40 mg total) by mouth 2 (two) times daily before a meal. 05/09/19   Angiulli, Mcarthur Rossetti, PA-C  polyethylene glycol (MIRALAX / GLYCOLAX) 17 g  packet Take 17 g by mouth daily. 10/06/19   Angiulli, Mcarthur Rossetti, PA-C  sodium chloride 0.9 % infusion Inject 100 mLs into the vein continuous. November 01, 2019   Angiulli, Mcarthur Rossetti, PA-C  traMADol (ULTRAM) 50 MG tablet Take 1 tablet (50 mg total) by mouth every 6 (six) hours as needed for moderate pain. 11-01-19   Charlton Amor, PA-C    Physical Exam: Vitals:   11-01-19 2145 11/01/19 2151  BP: (!) 116/96   Pulse:  92  Resp: (!) 36 (!) 28  Temp:  (!) 97.4 F (36.3 C)  TempSrc: Axillary Axillary  SpO2: 100% 100%  Weight:  40.8 kg    Physical Exam  Constitutional:  Rigors  HENT:  Head: Normocephalic.  Eyes: Right eye exhibits no discharge. Left eye exhibits no discharge.  Cardiovascular: Normal rate, regular rhythm and intact distal pulses.  Pulmonary/Chest: She is in respiratory distress.  Significantly increased work of breathing with accessory muscle use Tachypneic with respiratory rate in the 30s Unable to speak clearly in full sentences Rales appreciated at the bases No wheezing   Abdominal: Soft. Bowel sounds are normal. She exhibits no distension. There is no abdominal tenderness. There is no guarding.  Musculoskeletal:        General: No edema.     Cervical back: Neck supple.  Neurological:  Oriented to self Neuro exam could not be done given significant respiratory distress   Skin: She is not diaphoretic.    Labs on Admission: I have personally reviewed following labs and imaging studies  CBC: Recent Labs  Lab 09/30/19 0636 10/01/19 0940 10/03/19 0559 11/01/19 0851 11-01-19 1935  WBC 13.8* 12.4* 10.3 15.3* 19.4*  NEUTROABS 11.3* 9.4* 7.3 12.9* 18.0*  HGB 7.4* 7.8* 7.3* 7.1* 7.6*  HCT 23.9* 24.8* 24.6* 23.6* 25.4*  MCV 91.6 90.8 93.9 94.4 95.5  PLT 278 311 305 375 496*   Basic Metabolic Panel: Recent Labs  Lab 10/02/19 0554 10/03/19 0559 10/04/19 0508 2019/11/01 0851 Nov 01, 2019 1935  NA 138 143 141 141 144  K 3.4* 3.1* 3.6 4.1 4.2  CL 104 109 106 110 110   CO2 16*  GLUCOSE 86 103* 95 122* 169*  BUN 55* 59* 58* 67* 80*  CREATININE 1.80* 1.94* 1.95* 2.15* 2.45*  CALCIUM 7.7* 7.9* 8.2* 8.0* 8.2*   GFR: Estimated Creatinine Clearance: 13.2 mL/min (A) (by C-G formula based on SCr of 2.45 mg/dL (H)). Liver Function Tests: Recent Labs  Lab 09/29/19 0625  AST 42*  ALT 27  ALKPHOS 172*  BILITOT 0.8  PROT 5.3*  ALBUMIN 1.5*  No results for input(s): LIPASE, AMYLASE in the last 168 hours. No results for input(s): AMMONIA in the last 168 hours. Coagulation Profile: No results for input(s): INR, PROTIME in the last 168 hours. Cardiac Enzymes: No results for input(s): CKTOTAL, CKMB, CKMBINDEX, TROPONINI in the last 168 hours. BNP (last 3 results) No results for input(s): PROBNP in the last 8760 hours. HbA1C: No results for input(s): HGBA1C in the last 72 hours. CBG: Recent Labs  Lab 10/01/19 0936  GLUCAP 94   Lipid Profile: No results for input(s): CHOL, HDL, LDLCALC, TRIG, CHOLHDL, LDLDIRECT in the last 72 hours. Thyroid Function Tests: No results for input(s): TSH, T4TOTAL, FREET4, T3FREE, THYROIDAB in the last 72 hours. Anemia Panel: No results for input(s): VITAMINB12, FOLATE, FERRITIN, TIBC, IRON, RETICCTPCT in the last 72 hours. Urine analysis:    Component Value Date/Time   COLORURINE YELLOW November 01, 2019 2002   APPEARANCEUR CLOUDY (A) 11/01/19 2002   LABSPEC 1.018 11-01-19 2002   PHURINE 5.0 11-01-2019 2002   GLUCOSEU NEGATIVE 11/01/2019 2002   HGBUR MODERATE (A) 2019/11/01 2002   BILIRUBINUR NEGATIVE November 01, 2019 2002   KETONESUR NEGATIVE 11/01/19 2002   PROTEINUR >=300 (A) 01-Nov-2019 2002   NITRITE NEGATIVE 2019/11/01 2002   LEUKOCYTESUR TRACE (A) 2019-11-01 2002    Radiological Exams on Admission: CT HEAD WO CONTRAST  Result Date: 11/01/19 CLINICAL DATA:  Altered mental status EXAM: CT HEAD WITHOUT CONTRAST TECHNIQUE: Contiguous axial images were obtained from the base of the skull through the  vertex without intravenous contrast. COMPARISON:  09/20/2019 FINDINGS: Brain: Mild atrophic changes and changes of prior lacunar infarcts are again seen and stable. New area of decreased attenuation is noted in the left occipital lobe when compared with the prior exam consistent with acute/subacute ischemia. No acute hemorrhage is identified. Vascular: No hyperdense vessel or unexpected calcification. Skull: Normal. Negative for fracture or focal lesion. Sinuses/Orbits: No acute finding. Other: None. IMPRESSION: Chronic atrophic and ischemic changes similar to that seen on the prior exam. A new geographic area of left occipital ischemia is noted which appears acute to subacute in nature. MRI may be helpful for further evaluation. Electronically Signed   By: Inez Catalina M.D.   On: 01-Nov-2019 21:45   DG Chest Port 1 View  Result Date: 11-01-19 CLINICAL DATA:  74 year old female with acute respiratory distress EXAM: PORTABLE CHEST 1 VIEW COMPARISON:  Chest radiograph dated 10/01/2019. FINDINGS: Diffuse interstitial and hazy airspace density. Overall interval progression of the pulmonary opacities primarily involving the right upper lobe and left lung most consistent with pneumonia. Clinical correlation and follow-up recommended. No pleural effusion or pneumothorax. Stable cardiomediastinal silhouette. Atherosclerotic calcification of the aortic arch. No acute osseous pathology. IMPRESSION: Interval worsening airspace disease. Clinical correlation and follow-up recommended. Electronically Signed   By: Anner Crete M.D.   On: 11/01/2019 19:42    Assessment/Plan Principal Problem:   HCAP (healthcare-associated pneumonia) Active Problems:   Acute CVA (cerebrovascular accident) ([facility])   UTI (urinary tract infection)   Acute respiratory failure with hypoxia (HCC)   Severe sepsis ([facility])   Acute hypoxic respiratory failure secondary to HCAP: Hypoxic to the 40s on 2 L supplemental oxygen.  Significantly  tachypneic (respiratory rate in the 30s) with accessory muscle use.  ABG with pH 7.41, PCO2 29, and PO2 68.  Chest x-ray showing diffuse interstitial and hazy airspace density.  Overall interval progression of pulmonary opacities primarily involving the right upper lobe and left lung most consistent with pneumonia.  No pleural effusion or pneumothorax.  Stable cardiomediastinal silhouette.  Bedside EKG without acute ischemic changes. -Transfer to progressive care unit and monitor closely.  She has been placed on BiPAP.  Repeat ABG in 2 hours.  Start antibiotics for HCAP.  Severe sepsis secondary to HCAP and UTI: Patient is febrile.  Labs showing worsening leukocytosis with WBC count 19.4.  Lactic acid significantly elevated at 5.4.  Blood pressure stable.  Chest x-ray with findings concerning for multifocal pneumonia as mentioned above.  Urine culture done 5/29 growing greater than 100,000 colonies of Enterococcus. -Vancomycin and Zosyn.  30 cc/kg fluid boluses per sepsis protocol.  Tylenol as needed for fevers.  Blood culture x2 ordered.  Repeat urine culture ordered.  Trend lactate.  Acute CVA Left-sided weakness with decreased functional mobility secondary to multifocal punctate anterior and posterior infarcts on 09/20/2019 and prior history of CVA in December 2020 with residual left-sided weakness as well Head CT was done given change in mental status which revealed a new geographic area of left occipital ischemia which radiologist felt was acute to subacute in nature.  Discussed with Dr. Wilford Corner from neurology who has reviewed the patient's scans and feels this is a new area of ischemia.  Patient is already on aspirin and not on DAPT due to anemia which required blood transfusion during her recent hospitalization.  CTA head and neck done during recent hospitalization showing patent right CEA and mild stenosis of proximal left CCA.  Recent labs showing LDL 28, A1c 5.7. Neurology feels there is not going to  be much of a change in her management.  Recommending brain MRI when patient is more stable. -Management of sepsis and hypoxia as mentioned above.  Patient is currently on BiPAP for respiratory distress, please obtain brain MRI when she is more stable.  Continue aspirin and Lipitor.  Acute metabolic encephalopathy: Severe sepsis/underlying infection and hypoxia likely contributing.  In addition, see CT findings mentioned above. -Management of these conditions mentioned above  High anion gap metabolic acidosis: Bicarb 16, anion gap 18.  Likely related to lactic acidosis. -IV fluid as mentioned above.  Trend lactate and continue to monitor BMP.  AKI on CKD stage III: Likely prerenal due to dehydration/poor oral intake.  Creatinine was stabilized 1.3 during her recent hospitalization and is now up to 2.4.  BUN 80. -Continue IV fluid hydration.  Monitor renal function and urine output.  Check urine sodium, creatinine.  Order renal ultrasound.  Avoid nephrotoxic agents/contrast.  Chest pain: Patient initially complained of chest pain but later denied.  Bedside EKG without acute ischemic changes. -Cardiac monitoring.  High sensitive troponin x2 pending.  ADDENDUM: BNP significantly elevated >4500.  EF was normal on recent echo done 09/22/2019.  High-sensitivity troponin elevated 1171, repeat stable at 1057.  Patient is currently not complaining of chest pain but history limited as she is very lethargic and on BiPAP.  Discussed with on-call cardiologist Dr. Meredeth Ide who feels that the elevated troponin is likely due to demand ischemia.  States even if this was ACS, patient is not a candidate for invasive work-up or anticoagulation given transfusion dependent anemia. -She was given IV fluid in the setting of severe sepsis.  Lactate has trended down.  IV fluids have now been stopped.  Hemoglobin 6.9 on labs, will hold off giving blood transfusion at this time due to concern for volume overload. -IV Lasix 40 mg  ordered, echo ordered. -Patient is critically ill and prognosis guarded.  I have placed palliative care consult for goals of care discussion  with the family.  DVT prophylaxis: Subcutaneous heparin Code Status: DNR/DNI.  Discussed with patient's husband and daughter at bedside. Disposition Plan: Status is: Inpatient  Remains inpatient appropriate because:Hemodynamically unstable, Altered mental status, Ongoing diagnostic testing needed not appropriate for outpatient work up, IV treatments appropriate due to intensity of illness or inability to take PO and Inpatient level of care appropriate due to severity of illness   Dispo: The patient is from: [facility]              Anticipated d/c is to: CIR              Anticipated d/c date is: > 3 days              Patient currently is not medically stable to d/c.  The medical decision making on this patient was of high complexity and the patient is at high risk for clinical deterioration, therefore this is a level 3 visit.  Patricia GiovanniVasundhra Tarquin Welcher MD [facility]  If 7PM-7AM, please contact night-coverage [email or web address]  10/16/2019, 10:35 PM

## 2019-10-05 NOTE — Progress Notes (Signed)
The chaplain responded to a consult for prayer. The chaplain prayed with the patient and family. The chaplain is available if needed.  Lavone Neri Chaplain Resident For questions concerning this note please contact me by pager [phone]

## 2019-10-05 NOTE — Progress Notes (Signed)
Transported patient from 4M13 to CT to 4E23 without event.

## 2019-10-05 NOTE — Discharge Summary (Addendum)
Physician Discharge Summary  Patient ID: Durel SaltsMartha J Kaffenberger MRN: [id number] DOB/AGE: 73/01/1946 73 y.o.  Admit date: 09/27/2019 Discharge date: 10/08/2019  Discharge Diagnoses:  Principal Problem:   CVA (cerebral vascular accident) [facility](HCC) Active Problems:   Spinal stenosis in cervical region   Anemia of chronic disease   Chronic diarrhea   Supplemental oxygen dependent   Hypoalbuminemia due to protein-calorie malnutrition (HCC)   Prediabetes   Leukocytosis   AKI (acute kidney injury) (HCC)   Transaminitis   Constipation   Acute lower UTI   Labile blood pressure Urosepsis  Discharged Condition: Guarded  Significant Diagnostic Studies: CT ANGIO HEAD W OR WO CONTRAST  Result Date: 09/21/2019 CLINICAL DATA:  Follow-up stroke.  Left-sided weakness. EXAM: CT ANGIOGRAPHY HEAD AND NECK TECHNIQUE: Multidetector CT imaging of the head and neck was performed using the standard protocol during bolus administration of intravenous contrast. Multiplanar CT image reconstructions and MIPs were obtained to evaluate the vascular anatomy. Carotid stenosis measurements (when applicable) are obtained utilizing NASCET criteria, using the distal internal carotid diameter as the denominator. CONTRAST:  100mL OMNIPAQUE IOHEXOL 350 MG/ML SOLN COMPARISON:  MRI head and CT head 09/20/2019. CT angio head and neck 04/19/2019 FINDINGS: CTA NECK FINDINGS Aortic arch: Atherosclerotic aortic arch. Mild stenosis origin of left common carotid artery. Atherosclerotic calcification left subclavian artery. Moderate stenosis left subclavian artery distal to the vertebral origin. Right carotid system: Postop right carotid endarterectomy. No significant right carotid stenosis Left carotid system: Mild stenosis at the origin and proximal aspect of the left common carotid artery unchanged. Mild atherosclerotic disease left carotid bifurcation without significant stenosis. Vertebral arteries: Mild stenosis origin of right vertebral  artery. No additional right vertebral artery stenosis to the skull base. Left vertebral artery patent to the skull base without significant stenosis. Skeleton: Mild cervical spine degenerative change. No acute skeletal abnormality. Compression fractures of T5 and T6 appear chronic. Other neck: Negative for mass or adenopathy in the neck. Upper chest: Chest CT from today reported separately. Review of the MIP images confirms the above findings CTA HEAD FINDINGS Anterior circulation: Atherosclerotic calcification in the cavernous carotid bilaterally. Moderate stenosis of the supraclinoid internal carotid artery on the left. Both anterior cerebral arteries are patent without stenosis. Hypoplastic left A1 segment. Moderate stenosis right M1 segment.  Right MCA branches patent. Moderate stenosis mid and distal left M1 segment. Left MCA branches patent with focal moderate stenosis in the sylvian fissure M2 branch unchanged. Posterior circulation: Both vertebral arteries patent to the basilar. PICA patent bilaterally. Moderate stenosis left vertebral artery at the left PICA level. Atherosclerotic disease in the basilar artery with moderate stenosis proximally. This is unchanged. Moderate stenosis of the proximal P2 branch bilaterally. Additional moderate stenosis in the distal PCA bilaterally. No aneurysm identified as question previously. Venous sinuses: Normal venous enhancement Anatomic variants: None Review of the MIP images confirms the above findings IMPRESSION: 1. Postop right carotid endarterectomy which is widely patent. 2. Mild stenosis proximal left common carotid artery. No significant stenosis in the left carotid bifurcation 3. Both vertebral arteries patent to the basilar with moderate stenosis left V4 segment unchanged. In addition, there is moderate stenosis proximal basilar and moderate stenosis in the posterior cerebral arteries bilaterally. 4. Negative for intracranial large vessel occlusion. 5. Moderate  stenosis in the middle cerebral arteries bilaterally unchanged. Electronically Signed   By: Marlan Palauharles  Clark M.D.   On: 09/21/2019 09:59   DG Chest 2 View  Result Date: 10/01/2019 CLINICAL DATA:  Reason for exam:  fever due to infection EXAM: CHEST - 2 VIEW COMPARISON:  Chest radiograph 09/27/2019 FINDINGS: Stable cardiomediastinal contours. There are persistent diffuse bilateral reticular and ground-glass opacities. No new focal consolidation. No pneumothorax or pleural effusion. No acute finding in the visualized skeleton. IMPRESSION: Stable chest with persistent diffuse bilateral opacities. Electronically Signed   By: Emmaline Kluver M.D.   On: 10/01/2019 13:04   DG Chest 2 View  Result Date: 09/27/2019 CLINICAL DATA:  Acute respiratory failure EXAM: CHEST - 2 VIEW COMPARISON:  09/21/2019 FINDINGS: Cardiac shadow is stable. Aortic calcifications are again seen. Patchy opacities are again identified bilaterally stable from the prior exam. No new focal abnormality is seen. No bony abnormality is noted. IMPRESSION: Stable patchy opacities bilaterally Electronically Signed   By: Alcide Clever M.D.   On: 09/27/2019 08:43   DG Abd 1 View  Result Date: 09/21/2019 CLINICAL DATA:  Obstipation EXAM: ABDOMEN - 1 VIEW COMPARISON:  09/13/2019 FINDINGS: Nonobstructed gas pattern with mild stool. Gas present in the rectum. Dystrophic gluteal calcifications. Small calcification in the left upper quadrant, felt to correspond to upper pole stone in the kidney. IMPRESSION: Nonobstructed bowel-gas pattern. Electronically Signed   By: Jasmine Pang M.D.   On: 09/21/2019 00:36   CT Head Wo Contrast  Result Date: 09/20/2019 CLINICAL DATA:  Left hand paralysis and numbness EXAM: CT HEAD WITHOUT CONTRAST TECHNIQUE: Contiguous axial images were obtained from the base of the skull through the vertex without intravenous contrast. COMPARISON:  MRI 04/18/2019, CT brain 04/18/2019 FINDINGS: Brain: No acute territorial infarction,  hemorrhage or intracranial mass. Atrophy and chronic small vessel ischemic change of the white matter. Multiple bilateral chronic infarcts involving the white matter, basal ganglia and thalamus. Small focal hypodensity within the left cerebellum, not clearly seen on the previous exam and potentially representing age indeterminate lacunar infarct. Stable ventricle size. Vascular: No hyperdense vessels.  Carotid vascular calcification Skull: Normal. Negative for fracture or focal lesion. Sinuses/Orbits: No acute finding. Other: None IMPRESSION: 1. Negative for intracranial hemorrhage or territorial infarction. 2. Small focal hypodensity in the left cerebellum, may reflect age indeterminate lacunar infarct. 3. Atrophy, chronic small vessel ischemic changes of the white matter and multiple bilateral chronic appearing lacunar infarcts. Electronically Signed   By: Jasmine Pang M.D.   On: 09/20/2019 20:09   CT ANGIO NECK W OR WO CONTRAST  Result Date: 09/21/2019 CLINICAL DATA:  Follow-up stroke.  Left-sided weakness. EXAM: CT ANGIOGRAPHY HEAD AND NECK TECHNIQUE: Multidetector CT imaging of the head and neck was performed using the standard protocol during bolus administration of intravenous contrast. Multiplanar CT image reconstructions and MIPs were obtained to evaluate the vascular anatomy. Carotid stenosis measurements (when applicable) are obtained utilizing NASCET criteria, using the distal internal carotid diameter as the denominator. CONTRAST:  OMNIPAQUE IOHEXOL 350 MG/ML SOLN COMPARISON:  MRI head and CT head 09/20/2019. CT angio head and neck 04/19/2019 FINDINGS: CTA NECK FINDINGS Aortic arch: Atherosclerotic aortic arch. Mild stenosis origin of left common carotid artery. Atherosclerotic calcification left subclavian artery. Moderate stenosis left subclavian artery distal to the vertebral origin. Right carotid system: Postop right carotid endarterectomy. No significant right carotid stenosis Left  carotid system: Mild stenosis at the origin and proximal aspect of the left common carotid artery unchanged. Mild atherosclerotic disease left carotid bifurcation without significant stenosis. Vertebral arteries: Mild stenosis origin of right vertebral artery. No additional right vertebral artery stenosis to the skull base. Left vertebral artery patent to the skull base without significant stenosis. Skeleton:  Mild cervical spine degenerative change. No acute skeletal abnormality. Compression fractures of T5 and T6 appear chronic. Other neck: Negative for mass or adenopathy in the neck. Upper chest: Chest CT from today reported separately. Review of the MIP images confirms the above findings CTA HEAD FINDINGS Anterior circulation: Atherosclerotic calcification in the cavernous carotid bilaterally. Moderate stenosis of the supraclinoid internal carotid artery on the left. Both anterior cerebral arteries are patent without stenosis. Hypoplastic left A1 segment. Moderate stenosis right M1 segment.  Right MCA branches patent. Moderate stenosis mid and distal left M1 segment. Left MCA branches patent with focal moderate stenosis in the sylvian fissure M2 branch unchanged. Posterior circulation: Both vertebral arteries patent to the basilar. PICA patent bilaterally. Moderate stenosis left vertebral artery at the left PICA level. Atherosclerotic disease in the basilar artery with moderate stenosis proximally. This is unchanged. Moderate stenosis of the proximal P2 branch bilaterally. Additional moderate stenosis in the distal PCA bilaterally. No aneurysm identified as question previously. Venous sinuses: Normal venous enhancement Anatomic variants: None Review of the MIP images confirms the above findings IMPRESSION: 1. Postop right carotid endarterectomy which is widely patent. 2. Mild stenosis proximal left common carotid artery. No significant stenosis in the left carotid bifurcation 3. Both vertebral arteries patent to  the basilar with moderate stenosis left V4 segment unchanged. In addition, there is moderate stenosis proximal basilar and moderate stenosis in the posterior cerebral arteries bilaterally. 4. Negative for intracranial large vessel occlusion. 5. Moderate stenosis in the middle cerebral arteries bilaterally unchanged. Electronically Signed   By: Marlan Palau M.D.   On: 09/21/2019 09:59   CT ANGIO CHEST PE W OR WO CONTRAST  Result Date: 09/21/2019 CLINICAL DATA:  Shortness of breath EXAM: CT ANGIOGRAPHY CHEST WITH CONTRAST TECHNIQUE: Multidetector CT imaging of the chest was performed using the standard protocol during bolus administration of intravenous contrast. Multiplanar CT image reconstructions and MIPs were obtained to evaluate the vascular anatomy. CONTRAST:  50 mL OMNIPAQUE IOHEXOL 350 MG/ML SOLN COMPARISON:  Chest radiograph Sep 21, 2019; chest CT April 21, 2019 FINDINGS: Cardiovascular: There is no demonstrable pulmonary embolus. There is no thoracic aortic aneurysm or dissection. There are scattered foci of calcification in visualized great vessels. There are foci of aortic atherosclerosis. There are multiple foci of coronary artery calcification. There is no pericardial effusion or pericardial thickening. Mediastinum/Nodes: Thyroid appears unremarkable. There are scattered subcentimeter mediastinal lymph nodes. There are lymph nodes in the subcarinal region, largest measuring 1.2 x 1.2 cm. There are lymph nodes anterior to the carina, largest measuring 1.6 x 1.3 cm. There is an aortopulmonary window lymph node measuring 1.8 x 1.1 cm. There is a lymph node in the inferior right hilum measuring 1.1 x 1.0 cm. There is fluid in portions of the mid to distal esophagus. Lungs/Pleura: There is a degree of underlying centrilobular emphysematous change there is extensive ground-glass type opacity throughout the lungs bilaterally, most severe in the upper lobes somewhat posteriorly but present to varying  degrees throughout the lungs bilaterally. There are moderate free-flowing pleural effusions bilaterally as well. There is a degree of underlying fibrosis, primarily in the lung bases. There is also a degree of lower lobe bronchiectatic change Upper Abdomen: There is reflux of contrast into the inferior vena cava and hepatic veins. Visualized upper abdominal structures otherwise appear unremarkable. Musculoskeletal: There is slight anterior wedging of several lower thoracic vertebral bodies. There is no appreciable blastic or lytic bone lesion. No evident chest wall lesion. Review of the  MIP images confirms the above findings. IMPRESSION: 1. No demonstrable pulmonary embolus. No thoracic aortic aneurysm or dissection. There is aortic atherosclerosis as well as foci of great vessel and coronary artery calcification. 2. Multifocal airspace opacity consistent with pneumonia throughout the lungs bilaterally. Moderate free-flowing pleural effusions. No appreciable consolidation. Question atypical organism pneumonia. In this regard, advise check of COVID-19 status. 3. Underlying degree of centrilobular emphysema. There is a degree of fibrosis, primarily in the lung bases as well as lower lobe bronchiectatic change. There may be a degree of underlying usual interstitial pneumonitis in the presence of emphysematous change. 4. Several enlarged lymph nodes which are of uncertain etiology. These lymph nodes potentially may have reactive etiology given the extensive parenchymal lung abnormalities. 5. Fluid in the mid to distal esophagus which may represent spontaneous reflux 1 motility disorder. 6. Reflux of contrast into the inferior vena cava and hepatic veins may be indicative of increased right heart pressure. Aortic Atherosclerosis (ICD10-I70.0) and Emphysema (ICD10-J43.9). Electronically Signed   By: Bretta Bang III M.D.   On: 09/21/2019 09:41   MR BRAIN WO CONTRAST  Result Date: 09/20/2019 CLINICAL DATA:   Initial evaluation for acute left upper extremity weakness. EXAM: MRI HEAD WITHOUT CONTRAST MRI CERVICAL SPINE WITHOUT CONTRAST TECHNIQUE: Multiplanar, multiecho pulse sequences of the brain and surrounding structures, and cervical spine, to include the craniocervical junction and cervicothoracic junction, were obtained without intravenous contrast. COMPARISON:  Comparison made with prior CT from earlier the same day. FINDINGS: MRI HEAD FINDINGS Brain: Generalized age-related cerebral atrophy. Extensive chronic microvascular ischemic disease with multiple remote lacunar infarcts seen clustered about the bilateral basal ganglia and thalami. Patchy involvement of the pons noted as well. 8 mm focus of restricted diffusion involving the left caudate consistent with an acute ischemic infarct (series 5, image 8). Associated susceptibility artifact suggests petechial hemorrhage without frank hemorrhagic transformation. No additional punctate 4 mm cortical infarct noted at the left occipital lobe (a series 5, image 76). No associated hemorrhage or mass effect. Additional faint subcentimeter diffusion abnormality at the high right frontal centrum semi ovale consistent with subacute small vessel ischemia. Mild residual diffusion abnormality seen about a largely chronic appearing lacunar infarct at the posterior right basal ganglia. Additional 12 mm vague focus of diffusion abnormality involving the left cerebellum consistent with a subacute left cerebellar infarct. No associated hemorrhage or mass effect. Multiple scattered chronic micro hemorrhages seen involving both cerebral and cerebellar hemispheres, most notable about the deep gray nuclei, favored to be related to chronic poorly controlled hypertension. Sequelae of cerebral amyloid would be the primary differential consideration. No mass lesion, midline shift or mass effect. No hydrocephalus. No extra-axial fluid collection. Pituitary gland suprasellar region normal.  Midline structures intact. Vascular: Major intracranial vascular flow voids are maintained. Skull and upper cervical spine: Craniocervical junction within normal limits. Bone marrow signal intensity normal. No scalp soft tissue abnormality. Sinuses/Orbits: Globes and orbital soft tissues within normal limits. Paranasal sinuses are largely clear. No significant mastoid effusion. Other: None. MRI CERVICAL SPINE FINDINGS Alignment: Vertebral bodies normally aligned with preservation of the normal cervical lordosis. No listhesis. Vertebrae: Vertebral body height maintained without evidence for acute or chronic fracture. Bone marrow signal intensity somewhat heterogeneous but within normal limits. No discrete or worrisome osseous lesions. No abnormal marrow edema. Cord: Signal intensity within the cervical spinal cord is within normal limits. Posterior Fossa, vertebral arteries, paraspinal tissues: Craniocervical junction within normal limits. Paraspinous and prevertebral soft tissues normal. Normal flow voids seen within  the vertebral arteries bilaterally. Disc levels: C2-C3: Unremarkable. C3-C4: Broad central disc protrusion indents the ventral thecal sac, partially effacing the ventral CSF. Mild spinal stenosis without cord deformity. Superimposed uncovertebral hypertrophy without significant foraminal encroachment. C4-C5: Diffuse degenerative disc osteophyte with bilateral uncovertebral hypertrophy. Broad posterior component flattens and effaces the ventral thecal sac, greater on the left. Superimposed mild facet and ligament flavum hypertrophy. Resultant moderate spinal stenosis with mild cord flattening. Left greater than right uncinate spurring with resultant moderate left worse than right C5 foraminal stenosis. C5-C6: Chronic intervertebral disc space narrowing with diffuse degenerative disc osteophyte. Broad posterior component flattens and partially faces the ventral thecal sac resultant mild spinal stenosis.  No cord deformity. Severe right with mild left C6 foraminal stenosis. C6-C7: Mild disc bulge with uncovertebral hypertrophy. No significant spinal stenosis. Moderate right C7 foraminal stenosis. No significant left foraminal narrowing. C7-T1:  Unremarkable. Visualized upper thoracic spine demonstrates no significant finding. IMPRESSION: MRI HEAD IMPRESSION: 1. 8 mm acute ischemic left basal ganglia infarct. Associated petechial hemorrhage without frank hemorrhagic transformation. 2. Additional small volume subacute ischemic changes involving the high right frontal centrum semi ovale and left cerebellum as above. 3. Underlying severe chronic microvascular ischemic disease with multiple remote lacunar infarcts involving the deep gray nuclei and pons. 4. Multiple scattered chronic micro hemorrhages involving both cerebral and cerebellar hemispheres, most pronounced about the deep gray nuclei. Changes related to chronic poorly controlled hypertension is favored. Cerebral amyloid would be the primary differential consideration. MRI CERVICAL SPINE IMPRESSION: 1. Degenerative disc osteophyte at C4-5 with resultant moderate spinal stenosis, with moderate left worse than right C5 foraminal narrowing. Finding could contribute to left-sided radicular symptoms. 2. Disc bulge with uncovertebral spurring at C5-6 and C6-7 with resultant moderate to severe right C6 and C7 foraminal stenosis. 3. Broad-based central disc protrusion at C3-4 with resultant mild spinal stenosis. Electronically Signed   By: Rise Mu M.D.   On: 09/20/2019 22:41   MR Cervical Spine Wo Contrast  Result Date: 09/20/2019 CLINICAL DATA:  Initial evaluation for acute left upper extremity weakness. EXAM: MRI HEAD WITHOUT CONTRAST MRI CERVICAL SPINE WITHOUT CONTRAST TECHNIQUE: Multiplanar, multiecho pulse sequences of the brain and surrounding structures, and cervical spine, to include the craniocervical junction and cervicothoracic junction, were  obtained without intravenous contrast. COMPARISON:  Comparison made with prior CT from earlier the same day. FINDINGS: MRI HEAD FINDINGS Brain: Generalized age-related cerebral atrophy. Extensive chronic microvascular ischemic disease with multiple remote lacunar infarcts seen clustered about the bilateral basal ganglia and thalami. Patchy involvement of the pons noted as well. 8 mm focus of restricted diffusion involving the left caudate consistent with an acute ischemic infarct (series 5, image 8). Associated susceptibility artifact suggests petechial hemorrhage without frank hemorrhagic transformation. No additional punctate 4 mm cortical infarct noted at the left occipital lobe (a series 5, image 76). No associated hemorrhage or mass effect. Additional faint subcentimeter diffusion abnormality at the high right frontal centrum semi ovale consistent with subacute small vessel ischemia. Mild residual diffusion abnormality seen about a largely chronic appearing lacunar infarct at the posterior right basal ganglia. Additional 12 mm vague focus of diffusion abnormality involving the left cerebellum consistent with a subacute left cerebellar infarct. No associated hemorrhage or mass effect. Multiple scattered chronic micro hemorrhages seen involving both cerebral and cerebellar hemispheres, most notable about the deep gray nuclei, favored to be related to chronic poorly controlled hypertension. Sequelae of cerebral amyloid would be the primary differential consideration. No mass lesion, midline shift  or mass effect. No hydrocephalus. No extra-axial fluid collection. Pituitary gland suprasellar region normal. Midline structures intact. Vascular: Major intracranial vascular flow voids are maintained. Skull and upper cervical spine: Craniocervical junction within normal limits. Bone marrow signal intensity normal. No scalp soft tissue abnormality. Sinuses/Orbits: Globes and orbital soft tissues within normal limits.  Paranasal sinuses are largely clear. No significant mastoid effusion. Other: None. MRI CERVICAL SPINE FINDINGS Alignment: Vertebral bodies normally aligned with preservation of the normal cervical lordosis. No listhesis. Vertebrae: Vertebral body height maintained without evidence for acute or chronic fracture. Bone marrow signal intensity somewhat heterogeneous but within normal limits. No discrete or worrisome osseous lesions. No abnormal marrow edema. Cord: Signal intensity within the cervical spinal cord is within normal limits. Posterior Fossa, vertebral arteries, paraspinal tissues: Craniocervical junction within normal limits. Paraspinous and prevertebral soft tissues normal. Normal flow voids seen within the vertebral arteries bilaterally. Disc levels: C2-C3: Unremarkable. C3-C4: Broad central disc protrusion indents the ventral thecal sac, partially effacing the ventral CSF. Mild spinal stenosis without cord deformity. Superimposed uncovertebral hypertrophy without significant foraminal encroachment. C4-C5: Diffuse degenerative disc osteophyte with bilateral uncovertebral hypertrophy. Broad posterior component flattens and effaces the ventral thecal sac, greater on the left. Superimposed mild facet and ligament flavum hypertrophy. Resultant moderate spinal stenosis with mild cord flattening. Left greater than right uncinate spurring with resultant moderate left worse than right C5 foraminal stenosis. C5-C6: Chronic intervertebral disc space narrowing with diffuse degenerative disc osteophyte. Broad posterior component flattens and partially faces the ventral thecal sac resultant mild spinal stenosis. No cord deformity. Severe right with mild left C6 foraminal stenosis. C6-C7: Mild disc bulge with uncovertebral hypertrophy. No significant spinal stenosis. Moderate right C7 foraminal stenosis. No significant left foraminal narrowing. C7-T1:  Unremarkable. Visualized upper thoracic spine demonstrates no  significant finding. IMPRESSION: MRI HEAD IMPRESSION: 1. 8 mm acute ischemic left basal ganglia infarct. Associated petechial hemorrhage without frank hemorrhagic transformation. 2. Additional small volume subacute ischemic changes involving the high right frontal centrum semi ovale and left cerebellum as above. 3. Underlying severe chronic microvascular ischemic disease with multiple remote lacunar infarcts involving the deep gray nuclei and pons. 4. Multiple scattered chronic micro hemorrhages involving both cerebral and cerebellar hemispheres, most pronounced about the deep gray nuclei. Changes related to chronic poorly controlled hypertension is favored. Cerebral amyloid would be the primary differential consideration. MRI CERVICAL SPINE IMPRESSION: 1. Degenerative disc osteophyte at C4-5 with resultant moderate spinal stenosis, with moderate left worse than right C5 foraminal narrowing. Finding could contribute to left-sided radicular symptoms. 2. Disc bulge with uncovertebral spurring at C5-6 and C6-7 with resultant moderate to severe right C6 and C7 foraminal stenosis. 3. Broad-based central disc protrusion at C3-4 with resultant mild spinal stenosis. Electronically Signed   By: Rise Mu M.D.   On: 09/20/2019 22:41   CT ABDOMEN PELVIS W CONTRAST  Result Date: 09/24/2019 CLINICAL DATA:  Weight loss EXAM: CT ABDOMEN AND PELVIS WITH CONTRAST TECHNIQUE: Multidetector CT imaging of the abdomen and pelvis was performed using the standard protocol following bolus administration of intravenous contrast. CONTRAST:  74mL OMNIPAQUE IOHEXOL 300 MG/ML  SOLN COMPARISON:  04/21/2019 FINDINGS: Lower chest: Subpleural reticulation/fibrosis at the lung bases, suggesting chronic interstitial lung disease. Small bilateral pleural effusions, new. Hepatobiliary: Liver is notable for a stable probable vascular malformation in the lateral segment left hepatic lobe (series 3/image 22). Gallbladder is unremarkable. No  intrahepatic or extrahepatic ductal dilatation. Pancreas: Within normal limits. Spleen: Within normal limits. Adrenals/Urinary Tract: Adrenal glands  are within normal limits. 5 mm nonobstructing left upper pole renal calculus (series 3/image 22). Right kidney is within normal limits. No hydronephrosis. Bladder is within normal limits. Stomach/Bowel: Stomach is within normal limits. No evidence of bowel obstruction. Appendix is not discretely visualized, reportedly surgically absent. Moderate colonic stool burden, suggesting constipation. Vascular/Lymphatic: No evidence of abdominal aortic aneurysm. Atherosclerotic calcifications of the abdominal aorta and branch vessels. No suspicious abdominopelvic lymphadenopathy. Reproductive: Status post hysterectomy. Left ovary is within normal limits.  No right adnexal mass. Other: No abdominopelvic ascites. Mild body wall edema. Soft tissue calcifications in the visualized soft tissues, including the bilateral gluteal regions, chronic. Musculoskeletal: Mild superior endplate compression fracture deformity at T9, chronic. Lumbar spine is within normal limits. IMPRESSION: Moderate colonic stool burden, suggesting constipation. 5 mm nonobstructing left upper pole renal calculus. No hydronephrosis. Small bilateral pleural effusions. No CT findings to account for the patient's weight loss. Electronically Signed   By: Charline Bills M.D.   On: 09/24/2019 05:22   DG Chest Port 1 View  Result Date: 10/30/2019 CLINICAL DATA:  74 year old female with acute respiratory distress EXAM: PORTABLE CHEST 1 VIEW COMPARISON:  Chest radiograph dated 10/01/2019. FINDINGS: Diffuse interstitial and hazy airspace density. Overall interval progression of the pulmonary opacities primarily involving the right upper lobe and left lung most consistent with pneumonia. Clinical correlation and follow-up recommended. No pleural effusion or pneumothorax. Stable cardiomediastinal silhouette.  Atherosclerotic calcification of the aortic arch. No acute osseous pathology. IMPRESSION: Interval worsening airspace disease. Clinical correlation and follow-up recommended. Electronically Signed   By: Elgie Collard M.D.   On: 10/27/2019 19:42   DG CHEST PORT 1 VIEW  Result Date: 09/21/2019 CLINICAL DATA:  Left hand paralysis weakness EXAM: PORTABLE CHEST 1 VIEW COMPARISON:  09/11/2019, 04/19/2019, CT chest 04/21/2019 FINDINGS: Diffuse bilateral reticular and ground-glass opacity. Stable cardiomediastinal silhouette. No pleural effusion or pneumothorax. IMPRESSION: Interval diffuse bilateral interstitial and ground-glass opacity some of which is due to chronic change though suspect acute superimposed process such as possible atypical or viral pneumonia. Electronically Signed   By: Jasmine Pang M.D.   On: 09/21/2019 00:34   DG Chest Port 1 View  Result Date: 09/11/2019 CLINICAL DATA:  Weakness EXAM: PORTABLE CHEST 1 VIEW COMPARISON:  04/19/2019 FINDINGS: The lung volumes are somewhat low. There are hazy ill-defined opacities at the lung bases bilaterally. There is no pneumothorax. No large pleural effusion. The heart size is normal. Aortic calcifications are noted. There is no acute osseous abnormality. IMPRESSION: Low lung volumes with bibasilar atelectasis. Electronically Signed   By: Katherine Mantle M.D.   On: 09/11/2019 21:34   DG Abd 2 Views  Result Date: 09/13/2019 CLINICAL DATA:  Diarrhea. EXAM: ABDOMEN - 2 VIEW COMPARISON:  CT abdomen pelvis dated April 21, 2019. FINDINGS: The bowel gas pattern is normal. No excessive colonic stool burden. There is no evidence of free air. Unchanged small calculus in the upper pole of the left kidney. Chronic interstitial changes at the lung bases again noted. Bilateral gluteal dystrophic calcifications again noted. No acute osseous abnormality. IMPRESSION: 1. No acute findings.  No excessive colonic stool burden. 2. Unchanged left nephrolithiasis.  Electronically Signed   By: Obie Dredge M.D.   On: 09/13/2019 10:35   DG Abd Portable 1V  Result Date: 09/28/2019 CLINICAL DATA:  Constipation, history kidney stones, hysterectomy EXAM: PORTABLE ABDOMEN - 1 VIEW COMPARISON:  At 09/21/2019 FINDINGS: Increased stool throughout colon especially mid transverse colon through rectum. No bowel dilatation or bowel wall  thickening. Bones demineralized. Questionable LEFT renal calculus 5 mm diameter. IMPRESSION: Significantly increased stool in the distal half of the colon and rectum. Electronically Signed   By: Ulyses Southward M.D.   On: 09/28/2019 13:04   ECHOCARDIOGRAM COMPLETE  Result Date: 09/22/2019    ECHOCARDIOGRAM REPORT   Patient Name:   FREDRICKA KOHRS Date of Exam: 09/22/2019 Medical Rec #:  [id number]        Height:       61.0 in Accession #:    [id number]       Weight:       107.6 lb Date of Birth:  06/04/1945         BSA:          1.451 m Patient Age:    74 years         BP:           169/86 mmHg Patient Gender: F                HR:           77 bpm. Exam Location:  Inpatient Procedure: 2D Echo Indications:    stroke 434.91  History:        Patient has prior history of Echocardiogram examinations, most                 recent 04/19/2019. Risk Factors:Hypertension.  Sonographer:    Delcie Roch Referring Phys: [id number] ANASTASSIA DOUTOVA IMPRESSIONS  1. Left ventricular ejection fraction, by estimation, is 55 to 60%. The left ventricle has normal function. The left ventricle has no regional wall motion abnormalities. Left ventricular diastolic parameters were normal.  2. Right ventricular systolic function is normal. The right ventricular size is moderately enlarged. There is moderately elevated pulmonary artery systolic pressure. The estimated right ventricular systolic pressure is 45.0 mmHg.  3. Left atrial size was moderately dilated.  4. Right atrial size was mildly dilated.  5. The mitral valve is normal in structure. Mild to moderate mitral valve  regurgitation. No evidence of mitral stenosis.  6. The aortic valve is normal in structure. Aortic valve regurgitation is not visualized. No aortic stenosis is present.  7. Tricuspid valve regurgitation is moderate.  8. Pulmonic valve regurgitation is moderate.  9. The inferior vena cava is normal in size with greater than 50% respiratory variability, suggesting right atrial pressure of 3 mmHg. FINDINGS  Left Ventricle: Left ventricular ejection fraction, by estimation, is 55 to 60%. The left ventricle has normal function. The left ventricle has no regional wall motion abnormalities. The left ventricular internal cavity size was normal in size. There is  no left ventricular hypertrophy. Left ventricular diastolic parameters were normal. Right Ventricle: The right ventricular size is moderately enlarged. No increase in right ventricular wall thickness. Right ventricular systolic function is normal. There is moderately elevated pulmonary artery systolic pressure. The tricuspid regurgitant  velocity is 3.24 m/s, and with an assumed right atrial pressure of 3 mmHg, the estimated right ventricular systolic pressure is 45.0 mmHg. Left Atrium: Left atrial size was moderately dilated. Right Atrium: Right atrial size was mildly dilated. Pericardium: Trivial pericardial effusion is present. Mitral Valve: The mitral valve is normal in structure. Normal mobility of the mitral valve leaflets. Mild mitral annular calcification. Mild to moderate mitral valve regurgitation. No evidence of mitral valve stenosis. Tricuspid Valve: The tricuspid valve is normal in structure. Tricuspid valve regurgitation is moderate . No evidence of tricuspid stenosis. Aortic Valve: The aortic valve is  normal in structure. Aortic valve regurgitation is not visualized. No aortic stenosis is present. Pulmonic Valve: The pulmonic valve was normal in structure. Pulmonic valve regurgitation is moderate. No evidence of pulmonic stenosis. Aorta: The aortic  root is normal in size and structure. Venous: The inferior vena cava is normal in size with greater than 50% respiratory variability, suggesting right atrial pressure of 3 mmHg. IAS/Shunts: No atrial level shunt detected by color flow Doppler.  LEFT VENTRICLE PLAX 2D LVIDd:         4.16 cm     Diastology LVIDs:         2.56 cm     LV e' lateral:   10.10 cm/s LV PW:         0.95 cm     LV E/e' lateral: 9.1 LV IVS:        0.74 cm     LV e' medial:    8.49 cm/s LVOT diam:     1.90 cm     LV E/e' medial:  10.8 LV SV:         51 LV SV Index:   35 LVOT Area:     2.84 cm  LV Volumes (MOD) LV vol d, MOD A4C: 46.2 ml LV vol s, MOD A4C: 20.7 ml LV SV MOD A4C:     46.2 ml RIGHT VENTRICLE             IVC RV S prime:     13.50 cm/s  IVC diam: 1.67 cm TAPSE (M-mode): 1.9 cm LEFT ATRIUM             Index       RIGHT ATRIUM           Index LA diam:        3.50 cm 2.41 cm/m  RA Area:     15.00 cm LA Vol (A2C):   51.7 ml 35.63 ml/m RA Volume:   38.80 ml  26.74 ml/m LA Vol (A4C):   69.1 ml 47.62 ml/m LA Biplane Vol: 60.9 ml 41.96 ml/m  AORTIC VALVE LVOT Vmax:   92.60 cm/s LVOT Vmean:  54.300 cm/s LVOT VTI:    0.180 m  AORTA Ao Root diam: 2.60 cm MITRAL VALVE               TRICUSPID VALVE MV Area (PHT): 4.39 cm    TR Peak grad:   42.0 mmHg MV Decel Time: 173 msec    TR Vmax:        324.00 cm/s MV E velocity: 91.70 cm/s MV A velocity: 93.60 cm/s  SHUNTS MV E/A ratio:  0.98        Systemic VTI:  0.18 m                            Systemic Diam: 1.90 cm Weston Brass MD Electronically signed by Weston Brass MD Signature Date/Time: 09/22/2019/11:21:18 AM    Final    VAS [place] LOWER EXTREMITY VENOUS (DVT)  Result Date: 09/22/2019  Lower Venous DVTStudy Indications: Stroke.  Risk Factors: None identified. Limitations: Patient positioning, patient immobility. Comparison Study: No prior studies. Performing Technologist: Chanda Busing RVT  Examination Guidelines: A complete evaluation includes B-mode imaging, spectral Doppler,  color Doppler, and power Doppler as needed of all accessible portions of each vessel. Bilateral testing is considered an integral part of a complete examination. Limited examinations for reoccurring indications may be performed as noted. The reflux  portion of the exam is performed with the patient in reverse Trendelenburg.  +---------+---------------+---------+-----------+----------+--------------+ RIGHT    CompressibilityPhasicitySpontaneityPropertiesThrombus Aging +---------+---------------+---------+-----------+----------+--------------+ CFV      Full           Yes      Yes                                 +---------+---------------+---------+-----------+----------+--------------+ SFJ      Full                                                        +---------+---------------+---------+-----------+----------+--------------+ FV Prox  Full                                                        +---------+---------------+---------+-----------+----------+--------------+ FV Mid   Full                                                        +---------+---------------+---------+-----------+----------+--------------+ FV DistalFull                                                        +---------+---------------+---------+-----------+----------+--------------+ PFV      Full                                                        +---------+---------------+---------+-----------+----------+--------------+ POP      Full           Yes      Yes                                 +---------+---------------+---------+-----------+----------+--------------+ PTV      Full                                                        +---------+---------------+---------+-----------+----------+--------------+ PERO     Full                                                        +---------+---------------+---------+-----------+----------+--------------+    +---------+---------------+---------+-----------+----------+--------------+ LEFT     CompressibilityPhasicitySpontaneityPropertiesThrombus Aging +---------+---------------+---------+-----------+----------+--------------+ CFV      Full           Yes  Yes                                 +---------+---------------+---------+-----------+----------+--------------+ SFJ      Full                                                        +---------+---------------+---------+-----------+----------+--------------+ FV Prox  Full                                                        +---------+---------------+---------+-----------+----------+--------------+ FV Mid   Full                                                        +---------+---------------+---------+-----------+----------+--------------+ FV DistalFull                                                        +---------+---------------+---------+-----------+----------+--------------+ PFV      Full                                                        +---------+---------------+---------+-----------+----------+--------------+ POP      Full           Yes      Yes                                 +---------+---------------+---------+-----------+----------+--------------+ PTV      Full                                                        +---------+---------------+---------+-----------+----------+--------------+ PERO     Full                                                        +---------+---------------+---------+-----------+----------+--------------+     Summary: RIGHT: - There is no evidence of deep vein thrombosis in the lower extremity.  - No cystic structure found in the popliteal fossa.  LEFT: - There is no evidence of deep vein thrombosis in the lower extremity.  - No cystic structure found in the popliteal fossa.  *See table(s) above for measurements and observations. Electronically signed  by Sherald Hess MD on 09/22/2019 at 5:17:46 PM.    Final  Labs:  Basic Metabolic Panel: Recent Labs  Lab 09/29/19 4540 09/30/19 0636 10/01/19 0940 10/02/19 0554 10/03/19 0559 10/04/19 0508 10/04/2019 0851  NA 139 138 138 138 143 141 141  K 3.8 3.6 6.1* 3.4* 3.1* 3.6 4.1  CL 104 105 101 104 109 106 110  CO2 27 27 28 26 27 25 23   GLUCOSE 114* 97 93 86 103* 95 122*  BUN 39* 35* 47* 55* 59* 58* 67*  CREATININE 1.60* 1.49* 1.76* 1.80* 1.94* 1.95* 2.15*  CALCIUM 8.4* 8.3* 8.0* 7.7* 7.9* 8.2* 8.0*    CBC: Recent Labs  Lab 10/03/19 0559 10/29/2019 0851 10/06/2019 1935  WBC 10.3 15.3* 19.4*  NEUTROABS 7.3 12.9* PENDING  HGB 7.3* 7.1* 7.6*  HCT 24.6* 23.6* 25.4*  MCV 93.9 94.4 95.5  PLT 305 375 496*    CBG: Recent Labs  Lab 10/01/19 0936  GLUCAP 94    Brief HPI:   DALIA JOLLIE is a 74 y.o. 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  She lives with spouse use a rolling walker prior to admission.  Presented 09/20/2019 with increased left arm weakness numbness associated dyspnea with unintentional weight loss.  Cranial CT scan negative for intracranial hemorrhage or infarct.  Small focal hypodensity in the left cerebellum reflecting possible intermediate lacunar infarction.  Patient did not receive TPA.  MRI showed 8 mm acute ischemic left basal ganglia infarction.  Associated petechial hemorrhage without frank hemorrhagic transformation.  Multiple scattered chronic microhemorrhages involving both cerebral and cerebellar hemispheres most pronounced about the deep gray nuclei.  MRI cervical spine showed disc osteophyte C4-5 with resultant moderate spinal stenosis.  CT angiogram of the chest negative for pulmonary emboli.  CT angiogram head and neck right  carotid enterectomy widely patent.  No significant stenosis left carotid bifurcation.  CT abdomen pelvis showed no hydronephrosis moderate colonic stool burden.  Echocardiogram with ejection fraction of 60% no wall motion abnormalities.  Noted hemoglobin on admission 7.1 WBC 10,600 BUN 22 - nitrite on urinalysis troponin 33 SARS coronavirus negative sedimentation rate 29.  Venous Doppler studies negative for DVT maintained on aspirin for CVA prophylaxis.  No DAPT due to anemia.  She was transfused 1 unit of packed red blood cells during her acute stay.  She was seen by gastroenterology service Dr. Dulce Sellar for her anemia did not feel any work-up needed as she appeared to be stable hemoglobin not a candidate for endoscopy or colonoscopy.  Patient was admitted for a comprehensive rehab program.   Hospital Course: CONSTANCIA GEETING was admitted to rehab 09/27/2019 for inpatient therapies to consist of PT, ST and OT at least three hours five days a week. Past admission physiatrist, therapy team and rehab RN have worked together to provide customized collaborative inpatient rehab.  Pertaining to patient's multifocal punctate anterior and posterior infarct as well as history of CVA she remained on aspirin therapy no bleeding episodes.  Tramadol as needed for pain.  She was on gabapentin had to be adjusted due to some cognitive side effects.  Klonopin and Remeron for mood stabilization.  Chronic normocytic anemia follow-up gastroenterology services not a candidate for colonoscopy or endoscopy latest hemoglobin 7.1.  Patient with history of chronic diarrhea maintained on Creon.  Unintentional weight loss husband added stated value by oncology in the past work-up negative.  AKI 2.15 lisinopril had  been discontinued low-dose Tenormin had been ongoing.  Patient elevated white blood cell count 15,300 urine study showed Enterococcus initially on Rocephin changed to Keflex transition to amoxicillin.  Patient received gabapentin  the morning of transfer with suspected cognitive changes.  She did receive previous dose Supples earlier with similar cognitive changes however was also diagnosed with UTI at the time and cognitive changes thought to be secondary to UTI.  Chest x-ray was also ordered the morning of transfer, however was not completed.  Attempted to contact husband on several occasions however no answer and voicemail not set up throughout the day, discussed with nursing and informed nursing to update husband should he call back.  On the evening of October 24, 2019 decrease in oxygen saturations demanding BiPAP altered mental status heart rate 102 blood pressure 162/82 nonrebreather mask.  IV fluids have been initiated day prior for her AKI.  In light of these findings acute care services [facility] hospitalist contacted for patient to be transferred to acute care services.   Blood pressures were monitored on TID basis and soft and monitored     Marlana Salvage has made gains during rehab stay and is attending therapies  Marlana Salvage will continue to receive follow up therapies   after discharge  Rehab course: During patient's stay in rehab weekly team conferences were held to monitor patient's progress, set goals and discuss barriers to discharge. At admission, patient required  Physical exam.  Blood pressure 162/82 pulse 102 temperature 101 rectal respirations 19 oxygen saturations 88% nonrebreather mask Constitutional patient altered mental status/lethargic, frail cachectic HEENT Head.  Normocephalic and atraumatic Eyes.  Pupils round and reactive to light no discharge.nystagmus Neck.  Supple nontender no JVD without thyromegaly Cardiac regular rate rhythm no extra sounds or murmur heard Respiratory limited inspiratory effort clear to auscultation Abdomen.  Soft nontender positive bowel sounds without rebound Right upper extremity biceps 4/5 triceps 4/5 wrist extension 4 -/5 grip 4 -/5 finger abduction 3/5 Left upper extremity biceps 4-/5  triceps 4-/5 wrist extension 0/5 grip 2/5 finger abduction 0/5 Right lower extremity 4+/5 hip flexion knee extension knee flexion dorsi plantar flexion Left lower extremity hip flexion 4 -/5 knee extension knee flexion 4/5 dorsi flexion 0/5  He/She  has had improvement in activity tolerance, balance, postural control as well as ability to compensate for deficits. He/She has had improvement in functional use RUE/LUE  and RLE/LLE as well as improvement in awareness.  Working with energy conservation techniques total assist for donning doffing her shoes ADLs moderate assist for functional transfers complete supine to sit with minimal assist on various attempts.  Limited endurance needing multiple rest breaks       Disposition: Discharged to acute care services    Diet: Currently on a regular diet  Special Instructions: As per [facility] hospitalist  Medications at discharge.  As per [facility] hospitalist  30-35 minutes were spent completing discharge summary and discharge planning  Discharge Instructions    Ambulatory referral to Neurology   Complete by: As directed    An appointment is requested in approximately 4 weeks multifocal punctate anterior posterior infarcts      Follow-up Information    Jamse Arn, MD Follow up.   Specialty: Physical Medicine and Rehabilitation Why: Office to call for appointment Contact information: [address] [place] [address] [phone]           Signed: Cathlyn Parsons 10/24/19, 7:51 PM

## 2019-10-05 NOTE — Progress Notes (Addendum)
[place] PHYSICAL MEDICINE & REHABILITATION PROGRESS NOTE  Subjective/Complaints: Patient seen sitting up in bed this morning about to work with therapies.  He states he slept well overnight.  She denies pain.  Discussed left wrist and left ankle deficits with therapies as well as pain anticipation in therapies.  ROS: Denies CP, SOB, N/V/D, ? reliability  Objective: Vital Signs: Blood pressure 134/76, pulse 93, temperature 98.4 F (36.9 C), temperature source Oral, resp. rate 16, height 5\' 1"  (1.549 m), weight 42.6 kg, SpO2 90 %. No results found. Recent Labs    10/03/19 0559 2019-10-11 0851  WBC 10.3 15.3*  HGB 7.3* 7.1*  HCT 24.6* 23.6*  PLT 305 375   Recent Labs    10/04/19 0508 10-11-19 0851  NA 141 141  K 3.6 4.1  CL 106 110  CO2 25 23  GLUCOSE 95 122*  BUN 58* 67*  CREATININE 1.95* 2.15*  CALCIUM 8.2* 8.0*    Physical Exam: BP 134/76 (BP Location: Right Arm)   Pulse 93   Temp 98.4 F (36.9 C) (Oral)   Resp 16   Ht 5\' 1"  (1.549 m)   Wt 42.6 kg   SpO2 90%   BMI 17.75 kg/m   Constitutional: No distress . Vital signs reviewed. + Cachectic.  HENT: Normocephalic.  Atraumatic. Eyes: EOMI. No discharge. Cardiovascular: No JVD. RRR. Respiratory: Normal effort.  No stridor.  + Point. Bilaterally clear to auscultation. GI: Non-distended. Skin: Warm and dry.  Intact. Psych: Altered. Musc: No edema in extremities.  No tenderness in extremities. Neuro: Alert HOH Motor: RUE 4+/5 proximal distal LUE: Shoulder abduction, elbow flexion 4-/5, elbow extension 3+/5, wrist extension 0/5, finger abduction 2/5 Right lower extremity: 4+/5 proximal distal Left lower extremity: Hip flexion, knee extension 4/5, ankle dorsiflexion 2-/5, unchanged  Assessment/Plan: 1. Functional deficits secondary to multiple bilateral infarcts, largest on left with ? Radiculopathy which require 3+ hours per day of interdisciplinary therapy in a comprehensive inpatient rehab  setting.  Physiatrist is providing close team supervision and 24 hour management of active medical problems listed below.  Physiatrist and rehab team continue to assess barriers to discharge/monitor patient progress toward functional and medical goals  Care Tool:  Bathing    Body parts bathed by patient: Left arm, Chest, Abdomen, Front perineal area, Right upper leg, Left upper leg, Face   Body parts bathed by helper: Right arm, Buttocks Body parts n/a: Left lower leg, Right lower leg(pt reports her husband did this morning)   Bathing assist Assist Level: Moderate Assistance - Patient 50 - 74%     Upper Body Dressing/Undressing Upper body dressing   What is the patient wearing?: Pull over shirt    Upper body assist Assist Level: Maximal Assistance - Patient 25 - 49%    Lower Body Dressing/Undressing Lower body dressing      What is the patient wearing?: Incontinence brief, Pants     Lower body assist Assist for lower body dressing: Maximal Assistance - Patient 25 - 49%     Toileting Toileting    Toileting assist Assist for toileting: Maximal Assistance - Patient 25 - 49%     Transfers Chair/bed transfer  Transfers assist  Chair/bed transfer activity did not occur: Safety/medical concerns  Chair/bed transfer assist level: Moderate Assistance - Patient 50 - 74%     Locomotion Ambulation   Ambulation assist      Assist level: Moderate Assistance - Patient 50 - 74% Assistive device: Parallel bars Max distance: 57ft  Walk 10 feet activity   Assist     Assist level: Moderate Assistance - Patient - 50 - 74% Assistive device: Walker-rolling   Walk 50 feet activity   Assist Walk 50 feet with 2 turns activity did not occur: Safety/medical concerns         Walk 150 feet activity   Assist Walk 150 feet activity did not occur: Safety/medical concerns         Walk 10 feet on uneven surface  activity   Assist Walk 10 feet on uneven surfaces  activity did not occur: Safety/medical concerns         Wheelchair     Assist Will patient use wheelchair at discharge?: Yes Type of Wheelchair: Manual    Wheelchair assist level: Moderate Assistance - Patient 50 - 74% Max wheelchair distance: 58ftx2    Wheelchair 50 feet with 2 turns activity    Assist            Wheelchair 150 feet activity     Assist            Medical Problem List and Plan: 1.  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  Left C5 cervical radiculopathy/spinal stenosis, ?peripheral nerve injuries  Continue CIR   [facility]   Team conference today to discuss current and goals and coordination of care, home and environmental barriers, and discharge planning with nursing, case manager, and therapies.   NCV/EMG as outpatient 2.  Antithrombotics: -DVT/anticoagulation: SCDs.             -antiplatelet therapy: Aspirin 325 mg daily 3. Pain Management: Tramadol as needed  Gabapentin 100 BID started on 5/28 DC'd due to?  Cognitive side effects, will retrial at 100 daily, DC'd again due to cognitive side effects 4. Mood: Klonopin 0.5 mg twice daily, Remeron 50 mg nightly   Appreciate chaplain, notes reviewed.             -antipsychotic agents: N/A 5. Neuropsych: This patient is capable of making decisions on her own behalf.  Will consider neuropsych after treatment of UTI 6. Skin/Wound Care: Routine skin checks 7. Fluids/Electrolytes/Nutrition: Routine in and outs 8.  Chronic normocytic anemia.    Continue iron supplement.  Followed outpatient by GI services.  Not a candidate for colonoscopy or endoscopy  Hemoglobin 7.1 on 6/2, labs ordered for tomorrow  Continue to monitor 9.  Chronic diarrhea  Cont Creon.  Followed outpatient by GI services.   Started Miralax daily    Lactulose prn  KUB personally reviewed, showing constipation  Improving 10.  Unintentional weight loss.  CT abdomen pelvis unremarkable.  Dietary follow-up  Per husband she was evaluated by oncology prior to admission with negative work-up  Daily weights Filed Weights   09/30/19 0500 10/01/19 0500 10/24/19 0500  Weight: 41.9 kg 42.7 kg 42.6 kg   Stable on 6/2 11.  Hyperlipidemia.  Lipitor 12.  GERD.  Protonix 13.  Supplemental oxygen dependent   Wean as tolerated, continues to require on 6/2 14.  Hypertension  Lisinopril 2.5 started on 5/27- d/c due to AKI   Tenormin resumed on 5/29   Vitals:   Oct 24, 2019 0534 2019-10-24 0905  BP: 128/67 134/76  Pulse: (!) 102 93  Resp: 16   Temp: 98.4 F (36.9 C)   SpO2: (!) 70% 90%   Labile on 6/2 15.  Prediabetes  Slightly elevated on 6/2  Monitor with increased mobility 16.  Leukocytosis: Resolved  WBC 10.3 on 5/31  Afebrile   Continue to monitor  17.  AKI  Creatinine 2.15 on 6/2  Encourage fluids  Echo reviewed with EF 55 to 60%, continue with IVF started on 6/2  Continue to monitor 18.  Severe hypoalbuminemia with malnutrition  Supplement increased on 5/26 19.  Transaminitis  AST elevated, but improving on 5/27  Continue monitor 20.  Acute lower UTI/Leukocytosis  Encephalopathy appears resolved  UA +, urine culture showing Enterococcus  IV empiric Rocephin changed to Keflex, changed to amoxicillin after discussion with pharmacy again  CXR ordered 21.  Potassium  4.1 on 6/2  Supplemented x1 day on 5/31  LOS: 8 days A FACE TO FACE EVALUATION WAS PERFORMED  Patricia Stanley 10/25/2019, 10:08 AM

## 2019-10-05 NOTE — Consult Note (Signed)
Neuropsychological Consultation   Patient:   Patricia Stanley   DOB:   1945-06-22  MR Number:  [id number]  Location:  [facility] [address] [facility] [address] [place] [id number] [place] [address] Dept: [place]: [phone]           Date of Service:   10/04/2019  Start Time:   4 PM End Time:   5 PM  Provider/Observer:  Ilean Skill, Psy.D.       Clinical Neuropsychologist       Billing Code/Service: 01093  Chief Complaint:    Patricia Stanley is a 74 year old right-handed female with a history of CVA in December 2020 with residual left-sided weakness.  The patient received inpatient rehabilitation services from 04/25/2019 to 05/10/2019.  I did see her on 2 occasions during her prior admission.  The patient has been maintained on aspirin, prior medical history of hyperlipidemia, chronic normal cystic anemia, hypertension, depression, chronic diarrhea, carotid artery stenosis with CEA 04/2019.  Patient presented on 09/20/2019 with increased left arm weakness and numbness as well as left leg weakness with associated dyspnea and unintentional weight loss.  Cranial CT scan negative for intracranial hemorrhage or territorial infarct.  Small focal hypodensity in the left cerebellum reflecting possible intermediate lacunar infarction.  MRI showed 8 mm acute ischemic left basal ganglia infarction.  Associated petechial hemorrhage without clear hemorrhagic transformation.  Multiple scattered chronic microhemorrhages involving both cerebral and cerebellar hemispheres most pronounced about the deep gray matter nuclei.  MRI cervical spine degenerative disc with resultant moderate spinal stenosis.  The patient has followed up since her last admission with psychotropic interventions for her depression but continues to have significant depression but was having severe issues potentially related to medication side effects with recurrent  persistent diarrhea that was addressed with medication change.  Reason for Service:  The patient was referred for neuropsychological consultation due to coping and adjustment issues in the setting of long history of major depressive symptoms and anxiety.  Below is the HPI for the current admission.  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  Per chart review patient lives with spouse.  1 level home 6 steps to entry.  Ambulates with a rolling walker but needed some additional support over the past month due to failure to thrive.  Presented 09/20/2019 with increased left arm weakness and numbness as well as left leg weakness with associated dyspnea and unintentional weight loss.  Cranial CT scan negative for intracranial hemorrhage or territorial infarct.  Small focal hypodensity in the left cerebellum reflecting possible indeterminate lacunar infarction.  Patient did not receive TPA.  MRI showed 8 mm acute ischemic left basal ganglia infarction.  Associated petechial hemorrhage without frank hemorrhagic transformation.  Multiple scattered chronic microhemorrhages involving both cerebral and cerebellar hemispheres most pronounced about the deep gray nuclei.  MRI cervical spine degenerative disc osteophyte C4-5 with resultant moderate spinal stenosis.  CT angiogram of the chest showed no demonstration of pulmonary emboli.  CT angiogram head and neck right carotid enterectomy widely patent.  No significant stenosis in the left carotid bifurcation.  Negative for intracranial large vessel occlusion.  CT abdomen pelvis showed no hydronephrosis, moderate colonic stool burden otherwise unremarkable.  Echocardiogram with ejection  fraction of  60% no wall motion abnormalities.  Noted admission chemistries hemoglobin 7.1 as well as hemoglobin 7.2 09/16/2019, WBC 10.6, chloride 113, BUN 22, urinalysis negative nitrite, troponin 33, SARS coronavirus negative, sedimentation rate 29.  Venous Doppler studies negative for DVT.  Maintained on aspirin 325 mg daily for CVA prophylaxis.  No DAPT due to anemia.  Her latest hemoglobin was 7.3.   Patient was transfused 1 unit of packed red blood cells.  Patient had recently been seen by Dr. Dulce Sellar for gastroenterology services for history of anemia did not feel any work-up was needed as she appeared to be stable hemoglobin over the last couple of years.  Patient not felt to be a candidate at this time for endoscopy or colonoscopy.  Therapy evaluations completed and patient was admitted for a comprehensive rehab program.  Current Status:  Upon entering the room the patient was asleep and laying in bed.  Her husband was present.  The patient was difficult to wake and extremely lethargic throughout the visit.  The husband did most of the talking today.  He talked about some of the difficulties and struggles the patient has had since her prior admission.  We talked about some of the depressive features and some of the interventions I had suggested prior.  The patient and her husband reports that they had worked on some of the things we talked about but her persistent severe diarrhea with subsequent significant weight loss really made it difficult for her to actively work on some of the depressive symptoms.  The patient reports that she has seen significant progressive decline in her overall physical status since December of last year.  The patient had a rather flat affect but also was very lethargic and never gained fully alert interaction.  Behavioral Observation: Patricia Stanley  presents as a 74 y.o.-year-old Right Caucasian Female who appeared her stated age. her dress was Appropriate and she was Well  Groomed and her manners were Appropriate to the situation.  her participation was indicative of Drowsy and Inattentive behaviors.  There were any physical disabilities noted.  she displayed an appropriate level of cooperation and motivation.     Interactions:    Minimal Drowsy and Inattentive  Attention:   abnormal and attention span appeared shorter than expected for age  Memory:   abnormal; remote memory intact, recent memory impaired  Visuo-spatial:  not examined  Speech (Volume):  low  Speech:   normal; slurred  Thought Process:  Coherent and Relevant  Though Content:  WNL; not suicidal and not homicidal  Orientation:   person, place and situation  Judgment:   Fair  Planning:   Poor  Affect:    Blunted, Depressed, Flat and Lethargic  Mood:    Dysphoric  Insight:   Fair  Intelligence:   normal  Medical History:   Past Medical History:  Diagnosis Date  . Closed fracture of coccyx (HCC) 2015   AFTER FALL  . Depression   . GERD (gastroesophageal reflux disease)   . Headache    MIGRAINES OCCASIONAL  . History of kidney stones    HAS NOW  . Hypertension        Psychiatric History:  Patient does have a prior history of significant depression and anxiety symptoms with significant deterioration in her overall functioning.  Family Med/Psych History:  Family History  Problem Relation Age of Onset  . Hypertension Mother   . Hyperlipidemia Mother   . Hypertension Father   . Hyperlipidemia Father  Impression/DX:  Patricia Stanley is a 74 year old right-handed female with a history of CVA in December 2020 with residual left-sided weakness.  The patient received inpatient rehabilitation services from 04/25/2019 to 05/10/2019.  I did see her on 2 occasions during her prior admission.  The patient has been maintained on aspirin, prior medical history of hyperlipidemia, chronic normal cystic anemia, hypertension, depression, chronic diarrhea, carotid artery stenosis with CEA  04/2019.  Patient presented on 09/20/2019 with increased left arm weakness and numbness as well as left leg weakness with associated dyspnea and unintentional weight loss.  Cranial CT scan negative for intracranial hemorrhage or territorial infarct.  Small focal hypodensity in the left cerebellum reflecting possible intermediate lacunar infarction.  MRI showed 8 mm acute ischemic left basal ganglia infarction.  Associated petechial hemorrhage without clear hemorrhagic transformation.  Multiple scattered chronic microhemorrhages involving both cerebral and cerebellar hemispheres most pronounced about the deep gray matter nuclei.  MRI cervical spine degenerative disc with resultant moderate spinal stenosis.  The patient has followed up since her last admission with psychotropic interventions for her depression but continues to have significant depression but was having severe issues potentially related to medication side effects with recurrent persistent diarrhea that was addressed with medication change.  Upon entering the room the patient was asleep and laying in bed.  Her husband was present.  The patient was difficult to wake and extremely lethargic throughout the visit.  The husband did most of the talking today.  He talked about some of the difficulties and struggles the patient has had since her prior admission.  We talked about some of the depressive features and some of the interventions I had suggested prior.  The patient and her husband reports that they had worked on some of the things we talked about but her persistent severe diarrhea with subsequent significant weight loss really made it difficult for her to actively work on some of the depressive symptoms.  The patient reports that she has seen significant progressive decline in her overall physical status since December of last year.  The patient had a rather flat affect but also was very lethargic and never gained fully alert  interaction.   Disposition/Plan:  Will follow up with the patient later this week or first of next week.  Hopefully, she will begin to regain some strength and be able to more fully participate in interactions.  Diagnosis:    Cerebrovascular accident (CVA) due to thrombosis of basilar artery (HCC) - Plan: Ambulatory referral to Neurology  Constipation - Plan: DG Abd Portable 1V, DG Abd Portable 1V, CANCELED: DG Abd 1 View, CANCELED: DG Abd 1 View  Fever due to infection - Plan: DG Chest 2 View, DG Chest 2 View  Leukocytosis - Plan: DG Chest 2 View, DG Chest 2 View         Electronically Signed   _______________________ Arley Phenix, Psy.D.

## 2019-10-05 NOTE — Progress Notes (Signed)
Team Conference Report to Patient/Family  Team Conference discussion was reviewed with the patient and caregiver, including goals, any changes in plan of care and target discharge date.  Patient and caregiver express understanding and are in agreement.  The patient has a target discharge date of (re-eval discharge date next week). Spouse requesting to see Dr. Allena Katz. Does not feel as patient will improve and would like her set up with Palliative/Hospice. Also is requesting nursing assist with meal, since patient is not eating.   Andria Rhein 10/06/2019, 2:05 PM

## 2019-10-05 NOTE — Significant Event (Signed)
Rapid Response Event Note  Overview: Respiratory failure in setting of severe sepsis  Initial Focused Assessment: Nursing staff notified our team of patient with tachypnea and SOB. Upon arrival, Patricia Stanley is arousable to voice and answers simple questions. She has some accessory muscle use with rigors and mottling of her skin. Pulse oximetry read 40s with corelated waveform. Pt was immediately placed on NRB mask at 15L and sats gradually came up to 89-90% on 15L. I notified Jessica RT for Bipap and ABG. Colonel Bald PA notified of events and orders received. IM will be consulted for inpatient transfer to PCU. Dr. Loney Loh came to bedside.    1920- Temp 101.9 F rectal, HR 102 ST, 162/82 (103), RR 32 with sats 92% on 15L NRB. Her WOB is improved since being placed on NRB mask. Will likely escalate to BIPAP to assist with hypoxia and increased WOB. Pt placed on BIPAP 12/6 at 100% with tV 600+ and RR 28. Awaiting transfer to PCU bed. Pt transferred to 4E23 without complication.  Interventions: -STAT PCXR -STAT ABG -BIPAP -Tylenol for fever -BCx2, urine cx, ABX zosyn/vanc per pharmacy -Tx to inpatient svc and PCU   Event Summary: Call received [phone] Arrived at call [phone] Call ended [phone]

## 2019-10-05 NOTE — Patient Care Conference (Signed)
Inpatient RehabilitationTeam Conference and Plan of Care Update Date: 10/24/2019   Time: 2:16 PM    Patient Name: Patricia Stanley      Medical Record Number: [id number]  Date of Birth: 1946/01/24 Sex: Female         Room/Bed: 4M13C/4M13C-01 Payor Info: Payor: [employer] MEDICARE / Plan: [place] [employer] / Product Type: *No Product type* /    Admit Date/Time:  09/27/2019  2:46 PM  Primary Diagnosis:  CVA (cerebral vascular accident) [facility])  Patient Active Problem List   Diagnosis Date Noted  . Acute lower UTI   . Labile blood pressure   . Constipation   . Spinal stenosis in cervical region   . Anemia of chronic disease   . Chronic diarrhea   . Supplemental oxygen dependent   . Hypoalbuminemia due to protein-calorie malnutrition ([place])   . Prediabetes   . Leukocytosis   . AKI (acute kidney injury) ([place])   . Transaminitis   . Obstipation 09/21/2019  . CVA (cerebral vascular accident) ([place]) 09/21/2019  . Symptomatic anemia 09/21/2019  . Generalized body aches 09/21/2019  . Bronchiectasis ([place]) 09/21/2019  . Palliative care by specialist   . Goals of care, counseling/discussion   . DNR (do not resuscitate)   . Anemia 09/12/2019  . Physical deconditioning 09/12/2019  . Malnutrition of moderate degree 09/12/2019  . Diarrhea 09/11/2019  . Moderate episode of recurrent major depressive disorder ([place])   . Small vessel cerebrovascular accident (CVA) ([place]) 04/25/2019  . Acute CVA (cerebrovascular accident) ([place]) 04/18/2019  . Weight loss, unintentional 04/18/2019  . Anxiety 04/18/2019  . Major depression in partial remission ([place]) 08/25/2012  . Osteoporosis, unspecified 08/25/2012  . HTN (hypertension) 08/25/2012  . Fall at home 08/24/2012  . Pelvic fracture ([place]) 08/24/2012  . Migraines 08/24/2012  . Renal insufficiency, mild 08/24/2012    Expected Discharge Date: Expected Discharge Date: (re-eval discharge date next week)  Team Members Present: Physician leading conference:  Dr. Delice Lesch Care Coodinator Present: Nestor Lewandowsky, RN, BSN, CRRN;Christina Sampson Goon, [place] Nurse Present: Mohammed Kindle, RN PT Present: Deniece Ree, PT OT Present: Meriel Pica, OT SLP Present: Jettie Booze, CF-SLP PPS Coordinator present : Ileana Ladd, Burna Mortimer, SLP     Current Status/Progress Goal Weekly Team Focus  Bowel/Bladder   Pt incontinent/continent of bowel and incontinent of bladder. LBM 10/04/2019  obtain continence of both bowel and bladder  every 2-3 hours and prn toilet pt.   Swallow/Nutrition/ Hydration             ADL's   mod - max A overall; unable to participate the last 2 days due to body aches and fatigue  Min A overall  ADL training, LUE NMR, balance, functional mobility   Mobility   very self limiting. bed moblity min-modA, still ModA for transfers. Able to walk 6-42ft with ModA/RW but limited due to fatigue and L LE pain. Poor awareness and activity tolerance.  supervision-CGA  OOB activity, L NMR, activity tolerance, standing, transfers, gait, WC   Communication             Safety/Cognition/ Behavioral Observations  difficult to assess due to fluctuations in lethargy and confusion, ~Max A for recall and Mod A for functional problem solving  Supervision  recall and use of compensatory strategies, problem solving, sustained attention, error awareness   Pain   patient's pain in both legs.  pain level <3 with or without activity  assess pain every shift and prn   Skin  intact skin  prevent skin from breakdown  assess skin every shift and prn    Rehab Goals Patient on target to meet rehab goals: Yes Rehab Goals Revised: Patient on  target with goals *See Care Plan and progress notes for long and short-term goals.     Barriers to Discharge  Current Status/Progress Possible Resolutions Date Resolved   Nursing                  PT  Inaccessible home environment;Nutrition means;Medical stability;Behavior  6 STE and too weak/fatigued to do  steps, very self limiting, very low activity tolerance, confusion              OT                  SLP                Care Coordinator Medical stability   on target.          Discharge Planning/Teaching Needs:  Patient plans to discharge home with daughter and spouse to provide care  Will schedule will daughter and spouse if recommended by therapy   Team Discussion:  UTI treated with Abx. General pain issues, complaining of "pain everywhere"; MD added gabapentin. Oncology workup inconclusive; questionable cause for discomfort. Staff note patient is not doing well overall; backsliding in function since admission with little progression. Blood pressures fluxuating, requires supplemental oxygen to keep sats WNL, continuous IVF started to support decline in kidney function/abnormal lab results.  Patient is lethargic and "loopy" which contributes to incontinence and limits participation in therapy.  Revisions to Treatment Plan:  Re-eval discharge destination and goals for discharge    Medical Summary Current Status: Left side weakness with decreased functional mobility secondary to multifocal punctate anterior and posterior infarcts on 09/20/2019 as well as history of CVA December 2020 with residual left-sided weakness as well as?  Left C5 cervical radiculopathy/spinal stenosis Weekly Focus/Goal: Improve mobility, anemia, supplemental 02, optimize DM/BP meds, AKI, UTI, pain  Barriers to Discharge: Medical stability;Nutrition means;Incontinence;Decreased family/caregiver support;Behavior   Possible Resolutions to Barriers: Therapies, follow labs, IVF, optimize BP meds, wean supplemental O2, abx   Continued Need for Acute Rehabilitation Level of Care: The patient requires daily medical management by a physician with specialized training in physical medicine and rehabilitation for the following reasons: Direction of a multidisciplinary physical rehabilitation program to maximize functional  independence : Yes Medical management of patient stability for increased activity during participation in an intensive rehabilitation regime.: Yes Analysis of laboratory values and/or radiology reports with any subsequent need for medication adjustment and/or medical intervention. : Yes   I attest that I was present, lead the team conference, and concur with the assessment and plan of the team.   Chana Bode B 10/10/2019, 2:16 PM

## 2019-10-05 NOTE — Significant Event (Signed)
Rapid Response Event Note  Overview: Respiratory failure in setting of severe sepsis  Initial Focused Assessment: Nursing staff notified our team of patient with tachypnea and SOB. Upon arrival, Ms.Rennert is arousable to voice and answers simple questions. She has some accessory muscle use with rigors and mottling of her skin. Pulse oximetry read 40s with corelated waveform. Pt was immediately placed on NRB mask at 15L and sats gradually came up to 89-90% on 15L. I notified Jessica RT for Bipap and ABG. Colonel Bald PA notified of events and orders received. IM will be consulted for inpatient transfer to PCU. Dr. Loney Loh came to bedside.    1920- Temp 101.9 F rectal, HR 102 ST, 162/82 (103), RR 32 with sats 92% on 15L NRB. Her WOB is improved since being placed on NRB mask. Will likely escalate to BIPAP to assist with hypoxia and increased WOB. Pt placed on BIPAP 12/6 at 100% with tV 600+ and RR 28. Awaiting transfer to PCU bed.   Interventions: -STAT PCXR -STAT ABG -BIPAP -Tylenol for fever -BCx2, urine cx, ABX zosyn/vanc per pharmacy -Tx to inpatient svc and PCU   Event Summary: Call received 1855 Arrived at call 1858 Call ended  Rose Fillers

## 2019-10-05 NOTE — Progress Notes (Signed)
Physical Therapy Note  Patient Details  Name: ASHBY LEFLORE MRN: [id number] Date of Birth: [birth year] Today's Date: 2019-10-18    Patient received in bed very lethargic and sleeping; required max verbal and tactile stimulation to even open her eyes for 1-2 seconds then closed her eyes again and went right back to sleep. Unable to get meaningful participation in therapy due to lethargy. Husband present and shares his concerns over her status today and how she has not been making any progress, requesting to speak to medical team about what may be causing this. Direct messaged PA-C and MD and asked them to reach out to husband as their time allows this afternoon. 75 minutes of skilled therapy time missed due to lethargy/inability to participate.    Madelaine Etienne, DPT, PN1   Supplemental Physical Therapist [facility]    Pager [phone] Acute Rehab Office [phone]    10/18/19, 3:22 PM

## 2019-10-05 NOTE — Progress Notes (Signed)
Physical Therapy Note  Patient Details  Name: Patricia Stanley MRN: [id number] Date of Birth: Apr 01, 1946 Today's Date: 10/04/2019    Patient received in bed, still very lethargic and waking enough to declare that she is cold. Nasal cannula were out of her nose, and Spo2 was 85% on room air; replaced cannula at 2LPM and spo2 increased back to 92%. 30 minutes of therapy time missed due to lethargy.    Madelaine Etienne, DPT, PN1   Supplemental Physical Therapist [facility]    Pager [phone] Acute Rehab Office [phone]

## 2019-10-05 NOTE — Progress Notes (Signed)
Pharmacy Antibiotic Note  Patricia Stanley is a 74 y.o. female patient of CIR with L-sided weakness with decreased functional mobility secondary to multifocal punctate anterior and posterior infarcts on 09/20/2019 as well as history of CVA December 2020.  Pt's WBC is up to 19.4 this evening. Pharmacy has been consulted for vancomycin and Zosyn dosing for pneumonia.  WBC 19.4, afebrile, Scr 2.45, CrCl 13.8 ml/min (Scr rising, renal function unstable)  Pt is currently on amoxicillin PO for treatment of enterococcal UTI (will d/c since starting Zosyn and vancomycin, as pathogen is susceptible to these antibiotics).  Plan: Zosyn 3.375 gm IV Q 12 hrs Vancomycin 750 mg IV X 1; given pt's unstable, worsening renal function, will dose vancomycin using levels at this point; will check vancomycin random level, Scr in ~12 hrs to evaluate renal function trend and vancomycin clearance and determine subsequent dosing strategy Monitor WBC, temp, cultures, renal function, vancomycin levels  Height: 5\' 1"  (154.9 cm) Weight: 42.6 kg (93 lb 14.7 oz) IBW/kg (Calculated) : 47.8  Temp (24hrs), Avg:98.3 F (36.8 C), Min:98.2 F (36.8 C), Max:98.4 F (36.9 C)  Recent Labs  Lab 09/30/19 0636 09/30/19 0636 10/01/19 0940 10/01/19 0940 10/01/19 1248 10/02/19 0554 10/03/19 0559 10/04/19 0508 2019/10/14 0851 2019-10-14 1935  WBC 13.8*  --  12.4*  --   --   --  10.3  --  15.3* 19.4*  CREATININE 1.49*   < > 1.76*   < >  --  1.80* 1.94* 1.95* 2.15* 2.45*  LATICACIDVEN  --   --  1.2  --  1.7  --   --   --   --  5.4*   < > = values in this interval not displayed.    Estimated Creatinine Clearance: 13.8 mL/min (A) (by C-G formula based on SCr of 2.45 mg/dL (H)).    Allergies  Allergen Reactions  . Hydralazine Hcl     Lupus type reaction - DO NOT USE   . Antihistamines, Chlorpheniramine-Type Other (See Comments)    Makes her heart race  . Paroxetine Other (See Comments)    Shakes-tremors    Antimicrobials  this admission: 5/29-5/31 Ceftriaxone 5/31-6/1 Keflex 6/1-6/2 Amoxicillin  Microbiology results: 5/29 BCx X 2: NGTD 5/29 UCx: >100,000 colonies/ml Enterococcus faecalis, suscept to ampicillin, vancomycin 6/2 Urine cx: pending  Thank you for allowing pharmacy to be a part of this patient's care.  8/2, PharmD, BCPS, [facility] Clinical Pharmacist Oct 14, 2019 8:40 PM

## 2019-10-06 ENCOUNTER — Inpatient Hospital Stay (HOSPITAL_COMMUNITY): Payer: Medicare HMO | Admitting: Physical Therapy

## 2019-10-06 ENCOUNTER — Inpatient Hospital Stay (HOSPITAL_COMMUNITY): Payer: Medicare HMO

## 2019-10-06 ENCOUNTER — Inpatient Hospital Stay (HOSPITAL_COMMUNITY): Payer: Medicare HMO | Admitting: Occupational Therapy

## 2019-10-06 ENCOUNTER — Inpatient Hospital Stay (HOSPITAL_COMMUNITY): Payer: Medicare HMO | Admitting: Speech Pathology

## 2019-10-06 DIAGNOSIS — A419 Sepsis, unspecified organism: Principal | ICD-10-CM

## 2019-10-06 DIAGNOSIS — Z789 Other specified health status: Secondary | ICD-10-CM

## 2019-10-06 DIAGNOSIS — J9601 Acute respiratory failure with hypoxia: Secondary | ICD-10-CM

## 2019-10-06 DIAGNOSIS — Z66 Do not resuscitate: Secondary | ICD-10-CM

## 2019-10-06 DIAGNOSIS — N390 Urinary tract infection, site not specified: Secondary | ICD-10-CM

## 2019-10-06 DIAGNOSIS — I639 Cerebral infarction, unspecified: Secondary | ICD-10-CM

## 2019-10-06 DIAGNOSIS — Z7189 Other specified counseling: Secondary | ICD-10-CM

## 2019-10-06 DIAGNOSIS — R6520 Severe sepsis without septic shock: Secondary | ICD-10-CM

## 2019-10-06 DIAGNOSIS — Z515 Encounter for palliative care: Secondary | ICD-10-CM

## 2019-10-06 LAB — COMPREHENSIVE METABOLIC PANEL
ALT: 19 U/L (ref 0–44)
ALT: 18 U/L (ref 0–44)
AST: 48 U/L — ABNORMAL HIGH (ref 15–41)
AST: 36 U/L (ref 15–41)
Albumin: 1.4 g/dL — ABNORMAL LOW (ref 3.5–5.0)
Albumin: 1.3 g/dL — ABNORMAL LOW (ref 3.5–5.0)
Alkaline Phosphatase: 173 U/L — ABNORMAL HIGH (ref 38–126)
Alkaline Phosphatase: 157 U/L — ABNORMAL HIGH (ref 38–126)
Anion gap: 10 (ref 5–15)
Anion gap: 8 (ref 5–15)
BUN: 73 mg/dL — ABNORMAL HIGH (ref 8–23)
BUN: 72 mg/dL — ABNORMAL HIGH (ref 8–23)
CO2: 18 mmol/L — ABNORMAL LOW (ref 22–32)
CO2: 21 mmol/L — ABNORMAL LOW (ref 22–32)
Calcium: 7.5 mg/dL — ABNORMAL LOW (ref 8.9–10.3)
Calcium: 7.5 mg/dL — ABNORMAL LOW (ref 8.9–10.3)
Chloride: 115 mmol/L — ABNORMAL HIGH (ref 98–111)
Chloride: 116 mmol/L — ABNORMAL HIGH (ref 98–111)
Creatinine, Ser: 2.3 mg/dL — ABNORMAL HIGH (ref 0.44–1.00)
Creatinine, Ser: 2.34 mg/dL — ABNORMAL HIGH (ref 0.44–1.00)
GFR calc Af Amer: 24 mL/min — ABNORMAL LOW (ref >60)
GFR calc Af Amer: 23 mL/min — ABNORMAL LOW (ref >60)
GFR calc non Af Amer: 20 mL/min — ABNORMAL LOW (ref >60)
GFR calc non Af Amer: 20 mL/min — ABNORMAL LOW (ref >60)
Glucose, Bld: 171 mg/dL — ABNORMAL HIGH (ref 70–99)
Glucose, Bld: 143 mg/dL — ABNORMAL HIGH (ref 70–99)
Potassium: 3.7 mmol/L (ref 3.5–5.1)
Potassium: 3.5 mmol/L (ref 3.5–5.1)
Sodium: 143 mmol/L (ref 135–145)
Sodium: 145 mmol/L (ref 135–145)
Total Bilirubin: 0.5 mg/dL (ref 0.3–1.2)
Total Bilirubin: 0.4 mg/dL (ref 0.3–1.2)
Total Protein: 5.4 g/dL — ABNORMAL LOW (ref 6.5–8.1)
Total Protein: 5.1 g/dL — ABNORMAL LOW (ref 6.5–8.1)

## 2019-10-06 LAB — CBC
HCT: 23.3 % — ABNORMAL LOW (ref 36.0–46.0)
HCT: 23.5 % — ABNORMAL LOW (ref 36.0–46.0)
Hemoglobin: 6.9 g/dL — CL (ref 12.0–15.0)
Hemoglobin: 6.9 g/dL — CL (ref 12.0–15.0)
MCH: 28.5 pg (ref 26.0–34.0)
MCH: 28.9 pg (ref 26.0–34.0)
MCHC: 29.4 g/dL — ABNORMAL LOW (ref 30.0–36.0)
MCHC: 29.6 g/dL — ABNORMAL LOW (ref 30.0–36.0)
MCV: 97.1 fL (ref 80.0–100.0)
MCV: 97.5 fL (ref 80.0–100.0)
Platelets: 337 10*3/uL (ref 150–400)
Platelets: 355 10*3/uL (ref 150–400)
RBC: 2.39 MIL/uL — ABNORMAL LOW (ref 3.87–5.11)
RBC: 2.42 MIL/uL — ABNORMAL LOW (ref 3.87–5.11)
RDW: 22.4 % — ABNORMAL HIGH (ref 11.5–15.5)
RDW: 22.4 % — ABNORMAL HIGH (ref 11.5–15.5)
WBC: 20.5 10*3/uL — ABNORMAL HIGH (ref 4.0–10.5)
WBC: 23.4 10*3/uL — ABNORMAL HIGH (ref 4.0–10.5)
nRBC: 0 % (ref 0.0–0.2)
nRBC: 0 % (ref 0.0–0.2)

## 2019-10-06 LAB — CREATININE, SERUM
Creatinine, Ser: 2.39 mg/dL — ABNORMAL HIGH (ref 0.44–1.00)
GFR calc Af Amer: 23 mL/min — ABNORMAL LOW (ref >60)
GFR calc non Af Amer: 19 mL/min — ABNORMAL LOW (ref >60)

## 2019-10-06 LAB — CULTURE, BLOOD (ROUTINE X 2)
Culture: NO GROWTH
Culture: NO GROWTH
Special Requests: ADEQUATE
Special Requests: ADEQUATE

## 2019-10-06 LAB — TROPONIN I (HIGH SENSITIVITY)
Troponin I (High Sensitivity): 1171 ng/L (ref <18)
Troponin I (High Sensitivity): 1057 ng/L (ref <18)
Troponin I (High Sensitivity): 945 ng/L (ref <18)

## 2019-10-06 LAB — BRAIN NATRIURETIC PEPTIDE: B Natriuretic Peptide: >4500 pg/mL — ABNORMAL HIGH (ref 0.0–100.0)

## 2019-10-06 LAB — PREPARE RBC (CROSSMATCH)

## 2019-10-06 LAB — CREATININE, URINE, RANDOM: Creatinine, Urine: 67.94 mg/dL

## 2019-10-06 LAB — URINE CULTURE: Culture: NO GROWTH

## 2019-10-06 LAB — LACTIC ACID, PLASMA: Lactic Acid, Venous: 2.9 mmol/L (ref 0.5–1.9)

## 2019-10-06 LAB — SODIUM, URINE, RANDOM: Sodium, Ur: 17 mmol/L

## 2019-10-06 MED ORDER — LORAZEPAM 2 MG/ML IJ SOLN
1.0000 mg | INTRAMUSCULAR | Status: DC | PRN
  Administered 2019-10-06: 1 mg via INTRAVENOUS
  Filled 2019-10-06: qty 1

## 2019-10-06 MED ORDER — MORPHINE 100MG IN NS 100ML (1MG/ML) PREMIX INFUSION
1.0000 mg/h | INTRAVENOUS | Status: DC
  Administered 2019-10-06: 1 mg/h via INTRAVENOUS
  Filled 2019-10-06: qty 100

## 2019-10-06 MED ORDER — GLYCOPYRROLATE 0.2 MG/ML IJ SOLN: 0.2000 mg | INTRAMUSCULAR | Status: DC | PRN

## 2019-10-06 MED ORDER — HALOPERIDOL 0.5 MG PO TABS
0.5000 mg | ORAL_TABLET | ORAL | Status: DC | PRN
  Filled 2019-10-06: qty 1

## 2019-10-06 MED ORDER — POLYVINYL ALCOHOL 1.4 % OP SOLN
1.0000 [drp] | Freq: Four times a day (QID) | OPHTHALMIC | Status: DC | PRN
  Administered 2019-10-06: 1 [drp] via OPHTHALMIC
  Filled 2019-10-06: qty 15

## 2019-10-06 MED ORDER — ONDANSETRON 4 MG PO TBDP: 4.0000 mg | ORAL_TABLET | Freq: Four times a day (QID) | ORAL | Status: DC | PRN

## 2019-10-06 MED ORDER — GLYCOPYRROLATE 1 MG PO TABS
1.0000 mg | ORAL_TABLET | ORAL | Status: DC | PRN
  Filled 2019-10-06: qty 1

## 2019-10-06 MED ORDER — LORAZEPAM 1 MG PO TABS: 1.0000 mg | ORAL_TABLET | ORAL | Status: DC | PRN

## 2019-10-06 MED ORDER — VANCOMYCIN HCL 500 MG/100ML IV SOLN: 500.0000 mg | INTRAVENOUS | Status: DC

## 2019-10-06 MED ORDER — LORAZEPAM 2 MG/ML PO CONC: 1.0000 mg | ORAL | Status: DC | PRN

## 2019-10-06 MED ORDER — ACETAMINOPHEN 325 MG PO TABS: 650.0000 mg | ORAL_TABLET | Freq: Four times a day (QID) | ORAL | Status: DC | PRN

## 2019-10-06 MED ORDER — HALOPERIDOL LACTATE 2 MG/ML PO CONC
0.5000 mg | ORAL | Status: DC | PRN
  Filled 2019-10-06: qty 0.3

## 2019-10-06 MED ORDER — ACETAMINOPHEN 650 MG RE SUPP: 650.0000 mg | Freq: Four times a day (QID) | RECTAL | Status: DC | PRN

## 2019-10-06 MED ORDER — MORPHINE SULFATE (CONCENTRATE) 10 MG/0.5ML PO SOLN
5.0000 mg | ORAL | Status: DC | PRN
  Administered 2019-10-06: 5 mg via SUBLINGUAL
  Filled 2019-10-06: qty 0.5

## 2019-10-06 MED ORDER — MORPHINE BOLUS VIA INFUSION
2.0000 mg | INTRAVENOUS | Status: DC | PRN
  Filled 2019-10-06: qty 2

## 2019-10-06 MED ORDER — SODIUM CHLORIDE 0.9% IV SOLUTION: Freq: Once | INTRAVENOUS | Status: AC

## 2019-10-06 MED ORDER — ONDANSETRON HCL 4 MG/2ML IJ SOLN: 4.0000 mg | Freq: Four times a day (QID) | INTRAMUSCULAR | Status: DC | PRN

## 2019-10-06 MED ORDER — FUROSEMIDE 10 MG/ML IJ SOLN
40.0000 mg | Freq: Once | INTRAMUSCULAR | Status: AC
  Administered 2019-10-06: 40 mg via INTRAVENOUS
  Filled 2019-10-06: qty 4

## 2019-10-06 MED ORDER — MORPHINE SULFATE (CONCENTRATE) 10 MG/0.5ML PO SOLN: 5.0000 mg | ORAL | Status: DC | PRN

## 2019-10-06 MED ORDER — HALOPERIDOL LACTATE 5 MG/ML IJ SOLN: 0.5000 mg | INTRAMUSCULAR | Status: DC | PRN

## 2019-10-06 NOTE — Progress Notes (Signed)
PROGRESS NOTE  Patricia Stanley ZOX:[id number] DOB: 12-27-1945   PCP: Marden Noble, MD  Patient is from: [facility]  DOA: 10/06/2019 LOS: 1  Brief Narrative / Interim history: 74 y.o. female with medical history significant of CVA in December 2020, carotid artery stenosis status post right CEA in December 2020, depression, GERD, kidney stones, hypertension, chronic diarrhea, chronic anemia.    Patient was admitted 09/11/2019-09/16/2019 for failure to thrive and was found to have chronic diarrhea with GI initiating Creon, cholestyramine, and Imodium.  She was felt to be too frail for endoscopic evaluation at that time.  Subsequently admitted on 09/20/2019 for left arm numbness and left leg weakness which had worsened compared to her chronic symptoms dating back to December 2020.  She was found to have multifocal punctate anterior and posterior infarcts, embolic pattern.  CTA head and neck noted patent right CEA and mild stenosis of proximal left CCA.  LDL 28, A1c 5.7.  Neurology recommended continuing aspirin 325 only and no DAPT due to anemia requiring treatment.  Her presentation was felt to be related to a hypercoagulable state from pulmonary infection/inflammation.  Patient was also diagnosed with acute hypoxic respiratory failure secondary to bronchiectasis versus atypical pneumonia versus recurrent aspiration.  She required 2 L oxygen via nasal cannula to keep her sats greater than 85% throughout most of her hospitalization.  CT angiogram done during this admission noted what appeared to be interstitial pulmonary disease and significant progression/marked changes since prior imaging in December 2020.  Pulmonology has seen the patient and SLP eval was done which noted no aspiration at the time of evaluation.  Plan was to follow-up with pulmonology in the outpatient setting with follow-up high-resolution chest CT- ?  Hydralazine associated lupus induced lung injury.  Short trial of diuresis was administered.   Patient also had small bilateral pleural effusions which were felt to be a consequence of malnutrition with severely depressed albumin.  She received 1 unit PRBCs for anemia as hemoglobin was 7.1 at the time of presentation.  Her anemia was felt to be related to chronic disease.  Patient also had AKI which was thought to be due to a combination of DH and IV contrast as well as poor nutritional intake/dehydration.  Creatinine stabilized at 1.3 during this admission.  Patient had also endorsed approximately 20 pound weight loss over the past 6 months.  CT abdomen pelvis did not reveal any findings to explain her unintentional weight loss.  It was suspected that this was likely related to hydralazine induced lupus type reaction as family had reported symptoms of joint aches, fatigue, malar facial rash, and diffuse achy pain coincident with new initiation of hydralazine therapy.  Patient also had significantly elevated D-dimer during this hospitalization 14.6 at the time of presentation and CT angiogram was negative for PE.  Her Covid test was negative.  Bilateral lower extremity Dopplers without evidence of DVT.  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  Patient was discharged to [facility] on 5/25.  At inpatient rehab, patient was diagnosed with a UTI and urine culture growing Enterococcus.  She was initially given IV Rocephin and antibiotics were changed to Keflex and then to amoxicillin.  On the day of admission, nursing staff and CIR noted patient was more lethargic. She also became acutely tachypneic and dyspneic.  She was noted to have accessory muscle use with rigors and mottling of her skin.  She was Hypoxic to the 40s  on 2 L supplemental oxygen when rapid response arrived and patient was placed on 15 L oxygen via nonrebreather which improved oxygen saturation.  Vital signs: Rectal temperature 101.9 F, heart rate 102, blood  pressure 162/82, respiratory rate 32 with sats 92% on 15 L nonrebreather.  Chest x-ray showed diffuse interstitial and hazy airspace densities.  There was interval progression of pulmonary opacities primarily involving RUL and left lung most consistent with pneumonia.-Leukocytosis to 20.  Lactic acid 5.4.  Blood cultures drawn.  She was a started on broad-spectrum antibiotics and admitted for severe sepsis.  Head CT was obtained to assess for lethargy/encephalopathy.  She was found to have new geographic area of left occipital ischemia concerning for acute to subacute infarct.  Neurology recommended MRI when patient is off BiPAP if it can be done safely.  Patient also had elevated BNP greater than 4500 and high-sensitivity troponin of 1100.  The next day, discussed patient's condition and findings with patient's husband and daughter.  Discussed treatment options ranging from aggressive intervention to full comfort measures.  Patient's husband decided to pursue full comfort measures stating that is what she would want.  Per patient's husband, patient has been bedbound for quite some times and has poor quality of life. He doesn't foresee meaningful recovery from current illness. He he thinks she should have been kept comfortable, and wonders why he hasn't thought about it before.   Patient has been started on morphine drip to alleviate symptoms with the anticipation of in-hospital death.   Subjective: Seen and examined earlier this morning.  She was on BiPAP, and likes to come off BiPAP be comfortable for the time she has left.   Objective: Vitals:   10/06/19 0832 10/06/19 0900 10/06/19 1123 10/06/19 1138  BP:  (!) 153/71 (!) 158/88   Pulse: 88 82 88 84  Resp: (!) 30 (!) 34 (!) 26 (!) 29  Temp:  98 F (36.7 C) 99.3 F (37.4 C)   TempSrc:   Oral   SpO2: 91% 96% 97% 95%  Weight:        Intake/Output Summary (Last 24 hours) at 10/06/2019 2149 Last data filed at 10/06/2019 2000 Gross per 24 hour    Intake 2339.15 ml  Output 200 ml  Net 2139.15 ml   Filed Weights   10/06/2019 2151  Weight: 40.8 kg    Examination:  GENERAL: On BiPAP HEENT: MMM.  Vision and hearing grossly intact.  RESP: On BiPAP. CVS:  RRR. Heart sounds normal.  ABD/GI/GU: BS+. Abd soft, NTND.  SKIN: no apparent skin lesion or wound NEURO: Sleepy but wakes to voice.  Oriented x2.  Notable weakness on the left PSYCH: Calm.  No agitation.  Assessment & Plan: End-of-life care/DNR/DNI -Palliative focused order set utilized -On morphine drip with as needed Ativan -Emotional support to the family -anticipate in hospital death  Acute hypoxic respiratory failure due to aspiration pneumonia  Severe sepsis secondary to aspiration pneumonia and UTI-lactic acid 5.2  Acute occipital ischemic CVA History of recurrent multifocal anterior and posterior CVA with left hemiparesis History of right carotid artery stenosis status post CEA  Acute metabolic encephalopathy  High anion gap metabolic acidosis  AKI on CKD-3A  Elevated troponin/elevated BNP-likely demand ischemia from severe sepsis but could be ACS.  Chronic diarrhea  Depression  Leukocytosis/bandemia  Lactic acidosis  Normocytic anemia: Hgb 6.9  Debility/physical deconditioning          Family Communication: Updated patient's husband and daughter at bedside.  Remains inpatient  appropriate because:In hospital death is expected     Consultants:  Cardiology over the phone Neurology over the phone Palliative medicine team   Sch Meds:  Scheduled Meds: Continuous Infusions: . morphine 5 mg/hr (10/06/19 2000)   PRN Meds:.acetaminophen **OR** acetaminophen, glycopyrrolate **OR** glycopyrrolate **OR** glycopyrrolate, haloperidol **OR** haloperidol **OR** haloperidol lactate, LORazepam **OR** LORazepam **OR** LORazepam, morphine, morphine CONCENTRATE **OR** morphine CONCENTRATE, ondansetron **OR** ondansetron (ZOFRAN) IV, polyvinyl  alcohol  Antimicrobials: Anti-infectives (From admission, onward)   Start     Dose/Rate Route Frequency Ordered Stop   10/21/2019 2200  vancomycin (VANCOREADY) IVPB 500 mg/100 mL  Status:  Discontinued     500 mg 100 mL/hr over 60 Minutes Intravenous Every 48 hours 10/06/19 0756 10/06/19 1501   04-09-20 2130  piperacillin-tazobactam (ZOSYN) IVPB 3.375 g  Status:  Discontinued     3.375 g 12.5 mL/hr over 240 Minutes Intravenous Every 12 hours 04-09-20 2119 10/06/19 1501   04-09-20 2130  vancomycin (VANCOREADY) IVPB 750 mg/150 mL     750 mg 150 mL/hr over 60 Minutes Intravenous  Once 04-09-20 2119 04-09-20 2239   04-09-20 2128  vancomycin variable dose per unstable renal function (pharmacist dosing)  Status:  Discontinued      Does not apply See admin instructions 04-09-20 2128 10/06/19 0755       I have personally reviewed the following labs and images: CBC: Recent Labs  Lab 09/30/19 0636 09/30/19 0636 10/01/19 0940 10/01/19 0940 10/03/19 0559 04-09-20 0851 04-09-20 1935 04-09-20 2333 10/06/19 0437  WBC 13.8*   < > 12.4*   < > 10.3 15.3* 19.4* 23.4* 20.5*  NEUTROABS 11.3*  --  9.4*  --  7.3 12.9* 18.0*  --   --   HGB 7.4*   < > 7.8*   < > 7.3* 7.1* 7.6* 6.9* 6.9*  HCT 23.9*   < > 24.8*   < > 24.6* 23.6* 25.4* 23.5* 23.3*  MCV 91.6   < > 90.8   < > 93.9 94.4 95.5 97.1 97.5  PLT 278   < > 311   < > 305 375 496* 355 337   < > = values in this interval not displayed.   BMP &GFR Recent Labs  Lab 10/04/19 0508 10/04/19 0508 04-09-20 0851 04-09-20 1935 04-09-20 2333 10/06/19 0437 10/06/19 0813  NA 141  --  141 144 143 145  --   K 3.6  --  4.1 4.2 3.7 3.5  --   CL 106  --  110 110 115* 116*  --   CO2 25  --  23 16* 18* 21*  --   GLUCOSE 95  --  122* 169* 171* 143*  --   BUN 58*  --  67* 80* 73* 72*  --   CREATININE 1.95*   < > 2.15* 2.45* 2.30* 2.34* 2.39*  CALCIUM 8.2*  --  8.0* 8.2* 7.5* 7.5*  --    < > = values in this interval not displayed.   Estimated  Creatinine Clearance: 13.5 mL/min (A) (by C-G formula based on SCr of 2.39 mg/dL (H)). Liver & Pancreas: Recent Labs  Lab 04-09-20 2333 10/06/19 0437  AST 48* 36  ALT 19 18  ALKPHOS 173* 157*  BILITOT 0.5 0.4  PROT 5.4* 5.1*  ALBUMIN 1.4* 1.3*   No results for input(s): LIPASE, AMYLASE in the last 168 hours. No results for input(s): AMMONIA in the last 168 hours. Diabetic: No results for input(s): HGBA1C in the last 72 hours. Recent Labs  Lab 10/01/19 0936  GLUCAP 94   Cardiac Enzymes: No results for input(s): CKTOTAL, CKMB, CKMBINDEX, TROPONINI in the last 168 hours. No results for input(s): PROBNP in the last 8760 hours. Coagulation Profile: No results for input(s): INR, PROTIME in the last 168 hours. Thyroid Function Tests: No results for input(s): TSH, T4TOTAL, FREET4, T3FREE, THYROIDAB in the last 72 hours. Lipid Profile: No results for input(s): CHOL, HDL, LDLCALC, TRIG, CHOLHDL, LDLDIRECT in the last 72 hours. Anemia Panel: No results for input(s): VITAMINB12, FOLATE, FERRITIN, TIBC, IRON, RETICCTPCT in the last 72 hours. Urine analysis:    Component Value Date/Time   COLORURINE YELLOW October 07, 2019 2002   APPEARANCEUR CLOUDY (A) October 07, 2019 2002   LABSPEC 1.018 Oct 07, 2019 2002   PHURINE 5.0 10/15/2019 2002   GLUCOSEU NEGATIVE 10/17/2019 2002   HGBUR MODERATE (A) 07-Oct-2019 2002   BILIRUBINUR NEGATIVE 10/25/2019 2002   KETONESUR NEGATIVE Oct 07, 2019 2002   PROTEINUR >=300 (A) 11/01/2019 2002   NITRITE NEGATIVE October 07, 2019 2002   LEUKOCYTESUR TRACE (A) October 07, 2019 2002   Sepsis Labs: Invalid input(s): PROCALCITONIN, LACTICIDVEN  Microbiology: Recent Results (from the past 240 hour(s))  Urine Culture     Status: Abnormal   Collection Time: 10/01/19  9:34 AM   Specimen: Urine, Catheterized  Result Value Ref Range Status   Specimen Description URINE, CATHETERIZED  Final   Special Requests   Final    NONE Performed at [facility], [address],  [place], [place] [address]    Culture >=100,000 COLONIES/mL ENTEROCOCCUS FAECALIS (A)  Final   Report Status 10/03/2019 FINAL  Final   Organism ID, Bacteria ENTEROCOCCUS FAECALIS (A)  Final      Susceptibility   Enterococcus faecalis - MIC*    AMPICILLIN <=2 SENSITIVE Sensitive     NITROFURANTOIN <=16 SENSITIVE Sensitive     VANCOMYCIN 1 SENSITIVE Sensitive     * >=100,000 COLONIES/mL ENTEROCOCCUS FAECALIS  Culture, blood (Routine X 2) w Reflex to ID Panel     Status: None   Collection Time: 10/01/19  9:40 AM   Specimen: BLOOD  Result Value Ref Range Status   Specimen Description BLOOD SITE NOT SPECIFIED  Final   Special Requests   Final    BOTTLES DRAWN AEROBIC AND ANAEROBIC Blood Culture adequate volume   Culture   Final    NO GROWTH 5 DAYS Performed at [facility], [address] [place], [place] [address]    Report Status 10/06/2019 FINAL  Final  Culture, blood (Routine X 2) w Reflex to ID Panel     Status: None   Collection Time: 10/01/19  9:51 AM   Specimen: BLOOD  Result Value Ref Range Status   Specimen Description BLOOD SITE NOT SPECIFIED  Final   Special Requests   Final    BOTTLES DRAWN AEROBIC AND ANAEROBIC Blood Culture adequate volume   Culture   Final    NO GROWTH 5 DAYS Performed at [facility], [address], [place], [place] [address]    Report Status 10/06/2019 FINAL  Final  Culture, Urine     Status: None   Collection Time: 07-Oct-2019  8:02 PM   Specimen: Urine, Random  Result Value Ref Range Status   Specimen Description URINE, RANDOM  Final   Special Requests NONE  Final   Culture   Final    NO GROWTH Performed at [facility], [address], [place], [place] [address]    Report Status 10/06/2019 FINAL  Final  Culture, blood (routine x 2)     Status: None (Preliminary result)   Collection Time: 10/22/2019 11:33 PM   Specimen: BLOOD  Result Value Ref Range Status   Specimen Description BLOOD LEFT ARM  Final   Special Requests    Final    BOTTLES DRAWN AEROBIC ONLY Blood Culture adequate volume   Culture   Final    NO GROWTH < 12 HOURS Performed at [facility], [address] [place], [place] [address]    Report Status PENDING  Incomplete  Culture, blood (routine x 2)     Status: None (Preliminary result)   Collection Time: 10/31/2019 11:47 PM   Specimen: BLOOD  Result Value Ref Range Status   Specimen Description BLOOD LEFT FOREARM  Final   Special Requests   Final    BOTTLES DRAWN AEROBIC ONLY Blood Culture adequate volume   Culture   Final    NO GROWTH < 12 HOURS Performed at [facility], [address] [place], [place] [address]    Report Status PENDING  Incomplete    Radiology Studies: US RENAL  Result Date: 10/06/2019 CLINICAL DATA:  Acute renal injury EXAM: RENAL / URINARY TRACT ULTRASOUND COMPLETE COMPARISON:  None. FINDINGS: Right Kidney: Renal measurements: 9.2 x 3.6 x 3.0 cm = volume: 51 mL. Mild increased echogenicity is noted without hydronephrosis. Left Kidney: Renal measurements: 10.3 x 4.2 x 2.6 cm = volume: 59 mL. Mild increased echogenicity is noted. No mass lesion or hydronephrosis is noted. Bladder: Bladder is partially distended. Other: None. IMPRESSION: Increased echogenicity bilaterally consistent with medical renal disease. Electronically Signed   By: Alcide Clever M.D.   On: 10/06/2019 01:29       Amrutha Avera T[facility] Elidia Bonen[facility]  If 7PM-7AM, please contact night-coverage [email or web address] Password [facility] 10/06/2019, 9:49 PM

## 2019-10-06 NOTE — Progress Notes (Signed)
BIPAP removed per patient request

## 2019-10-06 NOTE — Progress Notes (Signed)
   10/06/19 1636  Clinical Encounter Type  Visited With Patient and family together  Visit Type Spiritual support;Follow-up  Referral From Nurse  Consult/Referral To Chaplain  Spiritual Encounters  Spiritual Needs Emotional;Prayer  Stress Factors  Patient Stress Factors Health changes  Family Stress Factors Health changes  This chaplain responded to the consult for EOL spiritual care for the Pt. and family.  The Pt. husband and daughter are bedside.  The chaplain was pastorally present as the Pt. transitioned to comfort care.  The Pt. verbalized her desire to stop struggling to breathe and remain pain free.  The chaplain understands the family is supportive of the Pt. decision.  On the return visit, the chaplain prayed with the family and invited continuous spiritual care.

## 2019-10-06 NOTE — Progress Notes (Signed)
Late Entry**  Pt last seen at at 1758 before the rapid response event. Pt was alert, eating dinner with daughter at bedside. Pt was more alert that earlier in the day, and able to follow directions. Took oral meds in pudding with no issues. Drank some PO fluid after the meds. Noted K-pad placed on pt's chest which pt's daughter stated pt was feeling cold. No signs of distress noted. No shaking or labored breathing present at that time. Pt was at baseline state with no changes in skin color compared to earlier in the day.   Oct 19, 2019- 1830- daughter called staff to room. NT checked vital signs and noted O2 sat in low 40's and 30's, then called this RN to room to assess. (see my other note).   Marylu Lund, RN

## 2019-10-06 NOTE — Progress Notes (Signed)
Date and time critical results received: 10/06/19 at 01:11am   Test: CBC and Troponin  Critical Value: Hb 6.9, Troponin 1,171  Name of Provider Notified: Candiss Norse, NP  Orders Received: order for type and screen and prepare for 1 unit of PRBC transfusion.   Mews score was red and yellow. MD made aware. Per Dr. Loney Loh called by phone and verbally ordered to stop IV fluid, and give Lasix 40 mg IV stat. We concerned about fluid over load, we will hold blood transfusion and continue to observe because Pt does not have active bleeding. Pt's hemodynamically stable. On BiPAP, RR 24-30. SPO2 100%, HR 80s, NSR on monitor, BP 133/75- 153/75 mmHg. Continue to monitor.  Filiberto Pinks, RN

## 2019-10-06 NOTE — Progress Notes (Signed)
Pt transferred from 4M13  to 4E 23. On BIPAP machine with RT and RRT, Onalee Hua at bedside. Pt appeared lethargic, drowsy, spontaneously opened her eyes, responded to voice and followed commands. Oriented to self and family members, disoriented to time, place and situations.   RR 28 -30,  SPO2 98-100% on BiPAP Temp 97.4 -99.8 F axillary, HR 80s, NSR on monitor, BP 116/96 -145/83 mmHg,   CHG bath given, CCMD called and 2nd person verified, call bell within reached. Unit and visiting hour instructions given to family members, Pt's spouse and daughter at bed side. We will continue to monitor.  Filiberto Pinks, RN

## 2019-10-07 ENCOUNTER — Inpatient Hospital Stay: Payer: Medicare HMO | Admitting: Adult Health

## 2019-10-07 DIAGNOSIS — E8729 Other acidosis: Secondary | ICD-10-CM | POA: Diagnosis present

## 2019-10-07 DIAGNOSIS — G9341 Metabolic encephalopathy: Secondary | ICD-10-CM | POA: Diagnosis present

## 2019-10-07 DIAGNOSIS — I5033 Acute on chronic diastolic (congestive) heart failure: Secondary | ICD-10-CM | POA: Diagnosis present

## 2019-10-07 LAB — BPAM RBC
Blood Product Expiration Date: 202106232359
ISSUE DATE / TIME: 202106030335
Unit Type and Rh: 6200

## 2019-10-07 LAB — TYPE AND SCREEN
ABO/RH(D): A POS
Antibody Screen: NEGATIVE
Unit division: 0

## 2019-10-07 LAB — MISC LABCORP TEST (SEND OUT): Labcorp test code: 520085

## 2019-10-11 LAB — CULTURE, BLOOD (ROUTINE X 2)
Culture: NO GROWTH
Culture: NO GROWTH
Special Requests: ADEQUATE
Special Requests: ADEQUATE

## 2019-10-17 DIAGNOSIS — E440 Moderate protein-calorie malnutrition: Secondary | ICD-10-CM | POA: Diagnosis not present

## 2019-10-17 DIAGNOSIS — D649 Anemia, unspecified: Secondary | ICD-10-CM | POA: Diagnosis not present

## 2019-10-17 DIAGNOSIS — R5381 Other malaise: Secondary | ICD-10-CM | POA: Diagnosis not present

## 2019-10-17 DIAGNOSIS — R627 Adult failure to thrive: Secondary | ICD-10-CM | POA: Diagnosis not present

## 2019-11-03 NOTE — Progress Notes (Addendum)
Pt passed away, announcement of death at 00.18 am by Filiberto Pinks RN and Noah Delaine, RN. Pt's spouse at bedside. Emotional support given.   Notified on-call provider, Linton Flemings, NP. Death certificate signed. [facility] notified at 00.39 am.  Morphine gtt witness wasted in Stericycle  45 mg( 45 ml) with Melanee Spry RN.  Filiberto Pinks, RN

## 2019-11-03 NOTE — Death Summary Note (Signed)
DEATH SUMMARY   Patient Details  Name: Patricia Stanley MRN: [id number] DOB: Sep 06, 1945  Admission/Discharge Information   Admit Date:  2019-10-21  Date of Death: Date of Death: 10/23/19  Time of Death: Time of Death: 10/06/22  Length of Stay: 2  Referring Physician: Marden Noble, MD   Reason(s) for Hospitalization  Severe sepsis due to aspiration pneumonia and  UTI  Diagnoses  Preliminary cause of death: End of life care Secondary Diagnoses (including complications and co-morbidities):  Principal Problem:   End of life care Active Problems:   HTN (hypertension)   Acute CVA (cerebrovascular accident) (HCC)   Anemia   Physical deconditioning   Malnutrition of moderate degree   DNR (do not resuscitate)   Acute ischemic stroke (HCC)   Chronic diarrhea   Hypoalbuminemia due to protein-calorie malnutrition (HCC)   Acute kidney injury superimposed on chronic kidney disease (HCC)   Elevated troponin   UTI (urinary tract infection)   HCAP (healthcare-associated pneumonia)   Acute respiratory failure with hypoxia (HCC)   Severe sepsis (HCC)   Acute metabolic encephalopathy   High anion gap metabolic acidosis   Acute on chronic diastolic CHF (congestive heart failure) [facility])   Brief Hospital Course (including significant findings, care, treatment, and services provided and events leading to death)  Patricia Stanley is a 75 y.o. year old female with medical history significant of CVA in December 2020, carotid artery stenosis status post right CEA in December 2020, diastolic CHF, moderate malnutrition, debility, depression, GERD, kidney stones, hypertension, chronic diarrhea and chronic anemia.   Patient wasadmitted 09/11/2019-09/16/2019 for failure to thrive and was found to have chronic diarrhea with GI initiating Creon, cholestyramine, and Imodium. She was felt to be too frail for endoscopic evaluation at that time.  Subsequently admittedon 2019-10-06 for left arm numbness and left  leg weakness which had worsened compared to her chronic symptoms dating back to December 2020. She was found to have multifocal punctate anterior and posterior infarcts, embolic pattern. CTA head and neck noted patent right CEA and mild stenosis of proximal left CCA. LDL 28, A1c 5.7. Neurology recommended continuing aspirin 325 only and no DAPT due to anemia requiring treatment. Her presentation was felt to berelated to ahypercoagulable state from pulmonary infection/inflammation. Patient was also diagnosed with acute hypoxic respiratory failuresecondary to bronchiectasis versus atypical pneumonia versus recurrent aspiration.Sherequired 2 L oxygen via nasal cannula to keep her sats greater than 85% throughout most of her hospitalization. CT angiogram done during this admission noted what appeared to be interstitial pulmonary disease and significant progression/marked changes since prior imaging in December 2020. Pulmonology has seen the patient and SLP eval was done which noted no aspiration at the time of evaluation. Plan was to follow-up with pulmonology in the outpatient setting with follow-up high-resolution chest CT- ? Hydralazine associated lupus induced lung injury. Short trial of diuresis was administered. Patient also had small bilateral pleural effusions which were felt to be a consequence of malnutrition with severely depressed albumin. She received 1 unit PRBCs for anemia as hemoglobin was 7.1 at the time of presentation. Her anemia was felt to be related to chronic disease. Patient also had AKI which was thought to be due to a combination of DH and IV contrast as well as poor nutritional intake/dehydration. Creatinine stabilized at 1.3 during this admission. Patient had also endorsed approximately 20 pound weight loss over the past 6 months. CT abdomen pelvis did not reveal any findings to explain her unintentional weight loss. It was  suspected that this was likely related to  hydralazine induced lupus type reaction as family had reported symptoms of joint aches, fatigue, malar facial rash, and diffuse achy pain coincident with new initiation of hydralazine therapy. Patient also had significantly elevated D-dimer during this hospitalization 14.6 at the time of presentation and CT angiogram was negative for PE. Her Covid test was negative. Bilateral lower extremity Dopplers without evidence of DVT. 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 Patient was discharged to CIR on 5/25.  At inpatient rehab, patient was diagnosed with a UTI and urine culture growing Enterococcus. She was initially given IV Rocephin and antibiotics were changed to Keflex and then to amoxicillin.  On the day of admission, nursing staff at inpatient rehab noted patient was more lethargic. She also became acutely tachypneic and dyspneic. She was noted to have accessory muscle use with rigors and mottling of her skin.  She wasHypoxic to the 40s on 2 L supplemental oxygen when rapid response arrived and patient was placed on 15 L oxygen via nonrebreather which improved oxygen saturation. Vital signs: Rectal temperature 101.9 F, heart rate 102, blood pressure 162/82, respiratory rate 32 with sats 92% on 15 L nonrebreather.  Chest x-ray showed diffuse interstitial and hazy airspace densities.  There was interval progression of pulmonary opacities primarily involving RUL and left lung most consistent with pneumonia.-Leukocytosis to 20.  Lactic acid 5.4.  Blood cultures drawn.  She was a started on broad-spectrum antibiotics and admitted for severe sepsis.  Head CT was obtained to assess for lethargy/encephalopathy.  She was found to have new geographic area of left occipital ischemia concerning for acute to subacute infarct.  Neurology recommended MRI when patient is off BiPAP if it can be done safely.  Patient also had  elevated BNP greater than 4500 and high-sensitivity troponin of 1100.  The next day, discussed patient's condition and findings with patient's husband and daughter.  Discussed treatment options ranging from aggressive intervention to full comfort measures.  Patient's husband decided to pursue full comfort measures stating that is what she would want.  Per patient's husband, patient has been bedbound for quite some times and has poor quality of life. He doesn't foresee meaningful recovery from current illness. He he thinks she should have been kept comfortable, and wonders why he hasn't thought about it before.   Patient has been started on morphine drip to alleviate symptoms with the anticipation of in-hospital death.   Patient passed away on October 17, 2019 at 12:18 AM with family at bedside.   Pertinent Labs and Studies  Significant Diagnostic Studies CT ANGIO HEAD W OR WO CONTRAST  Result Date: 09/21/2019 CLINICAL DATA:  Follow-up stroke.  Left-sided weakness. EXAM: CT ANGIOGRAPHY HEAD AND NECK TECHNIQUE: Multidetector CT imaging of the head and neck was performed using the standard protocol during bolus administration of intravenous contrast. Multiplanar CT image reconstructions and MIPs were obtained to evaluate the vascular anatomy. Carotid stenosis measurements (when applicable) are obtained utilizing NASCET criteria, using the distal internal carotid diameter as the denominator. CONTRAST:  OMNIPAQUE IOHEXOL 350 MG/ML SOLN COMPARISON:  MRI head and CT head 09/20/2019. CT angio head and neck 04/19/2019 FINDINGS: CTA NECK FINDINGS Aortic arch: Atherosclerotic aortic arch. Mild stenosis origin of left common carotid artery. Atherosclerotic calcification left subclavian artery. Moderate stenosis left subclavian artery distal to the vertebral origin. Right carotid system: Postop right carotid endarterectomy. No significant right carotid stenosis Left carotid system: Mild  stenosis at the origin and  proximal aspect of the left common carotid artery unchanged. Mild atherosclerotic disease left carotid bifurcation without significant stenosis. Vertebral arteries: Mild stenosis origin of right vertebral artery. No additional right vertebral artery stenosis to the skull base. Left vertebral artery patent to the skull base without significant stenosis. Skeleton: Mild cervical spine degenerative change. No acute skeletal abnormality. Compression fractures of T5 and T6 appear chronic. Other neck: Negative for mass or adenopathy in the neck. Upper chest: Chest CT from today reported separately. Review of the MIP images confirms the above findings CTA HEAD FINDINGS Anterior circulation: Atherosclerotic calcification in the cavernous carotid bilaterally. Moderate stenosis of the supraclinoid internal carotid artery on the left. Both anterior cerebral arteries are patent without stenosis. Hypoplastic left A1 segment. Moderate stenosis right M1 segment.  Right MCA branches patent. Moderate stenosis mid and distal left M1 segment. Left MCA branches patent with focal moderate stenosis in the sylvian fissure M2 branch unchanged. Posterior circulation: Both vertebral arteries patent to the basilar. PICA patent bilaterally. Moderate stenosis left vertebral artery at the left PICA level. Atherosclerotic disease in the basilar artery with moderate stenosis proximally. This is unchanged. Moderate stenosis of the proximal P2 branch bilaterally. Additional moderate stenosis in the distal PCA bilaterally. No aneurysm identified as question previously. Venous sinuses: Normal venous enhancement Anatomic variants: None Review of the MIP images confirms the above findings IMPRESSION: 1. Postop right carotid endarterectomy which is widely patent. 2. Mild stenosis proximal left common carotid artery. No significant stenosis in the left carotid bifurcation 3. Both vertebral arteries patent to the basilar with moderate stenosis left V4  segment unchanged. In addition, there is moderate stenosis proximal basilar and moderate stenosis in the posterior cerebral arteries bilaterally. 4. Negative for intracranial large vessel occlusion. 5. Moderate stenosis in the middle cerebral arteries bilaterally unchanged. Electronically Signed   By: Marlan Palau M.D.   On: 09/21/2019 09:59   DG Chest 2 View  Result Date: 10/01/2019 CLINICAL DATA:  Reason for exam: fever due to infection EXAM: CHEST - 2 VIEW COMPARISON:  Chest radiograph 09/27/2019 FINDINGS: Stable cardiomediastinal contours. There are persistent diffuse bilateral reticular and ground-glass opacities. No new focal consolidation. No pneumothorax or pleural effusion. No acute finding in the visualized skeleton. IMPRESSION: Stable chest with persistent diffuse bilateral opacities. Electronically Signed   By: Emmaline Kluver M.D.   On: 10/01/2019 13:04   DG Chest 2 View  Result Date: 09/27/2019 CLINICAL DATA:  Acute respiratory failure EXAM: CHEST - 2 VIEW COMPARISON:  09/21/2019 FINDINGS: Cardiac shadow is stable. Aortic calcifications are again seen. Patchy opacities are again identified bilaterally stable from the prior exam. No new focal abnormality is seen. No bony abnormality is noted. IMPRESSION: Stable patchy opacities bilaterally Electronically Signed   By: Alcide Clever M.D.   On: 09/27/2019 08:43   DG Abd 1 View  Result Date: 09/21/2019 CLINICAL DATA:  Obstipation EXAM: ABDOMEN - 1 VIEW COMPARISON:  09/13/2019 FINDINGS: Nonobstructed gas pattern with mild stool. Gas present in the rectum. Dystrophic gluteal calcifications. Small calcification in the left upper quadrant, felt to correspond to upper pole stone in the kidney. IMPRESSION: Nonobstructed bowel-gas pattern. Electronically Signed   By: Jasmine Pang M.D.   On: 09/21/2019 00:36   CT HEAD WO CONTRAST  Result Date: 10/12/2019 CLINICAL DATA:  Altered mental status EXAM: CT HEAD WITHOUT CONTRAST TECHNIQUE: Contiguous  axial images were obtained from the base of the skull through the vertex without intravenous contrast. COMPARISON:  09/20/2019 FINDINGS: Brain: Mild atrophic changes and changes of prior lacunar infarcts are again seen and stable. New area of decreased attenuation is noted in the left occipital lobe when compared with the prior exam consistent with acute/subacute ischemia. No acute hemorrhage is identified. Vascular: No hyperdense vessel or unexpected calcification. Skull: Normal. Negative for fracture or focal lesion. Sinuses/Orbits: No acute finding. Other: None. IMPRESSION: Chronic atrophic and ischemic changes similar to that seen on the prior exam. A new geographic area of left occipital ischemia is noted which appears acute to subacute in nature. MRI may be helpful for further evaluation. Electronically Signed   By: Alcide Clever M.D.   On: 10-08-19 21:45   CT Head Wo Contrast  Result Date: 09/20/2019 CLINICAL DATA:  Left hand paralysis and numbness EXAM: CT HEAD WITHOUT CONTRAST TECHNIQUE: Contiguous axial images were obtained from the base of the skull through the vertex without intravenous contrast. COMPARISON:  MRI 04/18/2019, CT brain 04/18/2019 FINDINGS: Brain: No acute territorial infarction, hemorrhage or intracranial mass. Atrophy and chronic small vessel ischemic change of the white matter. Multiple bilateral chronic infarcts involving the white matter, basal ganglia and thalamus. Small focal hypodensity within the left cerebellum, not clearly seen on the previous exam and potentially representing age indeterminate lacunar infarct. Stable ventricle size. Vascular: No hyperdense vessels.  Carotid vascular calcification Skull: Normal. Negative for fracture or focal lesion. Sinuses/Orbits: No acute finding. Other: None IMPRESSION: 1. Negative for intracranial hemorrhage or territorial infarction. 2. Small focal hypodensity in the left cerebellum, may reflect age indeterminate lacunar infarct. 3.  Atrophy, chronic small vessel ischemic changes of the white matter and multiple bilateral chronic appearing lacunar infarcts. Electronically Signed   By: Jasmine Pang M.D.   On: 09/20/2019 20:09   CT ANGIO NECK W OR WO CONTRAST  Result Date: 09/21/2019 CLINICAL DATA:  Follow-up stroke.  Left-sided weakness. EXAM: CT ANGIOGRAPHY HEAD AND NECK TECHNIQUE: Multidetector CT imaging of the head and neck was performed using the standard protocol during bolus administration of intravenous contrast. Multiplanar CT image reconstructions and MIPs were obtained to evaluate the vascular anatomy. Carotid stenosis measurements (when applicable) are obtained utilizing NASCET criteria, using the distal internal carotid diameter as the denominator. CONTRAST:  OMNIPAQUE IOHEXOL 350 MG/ML SOLN COMPARISON:  MRI head and CT head 09/20/2019. CT angio head and neck 04/19/2019 FINDINGS: CTA NECK FINDINGS Aortic arch: Atherosclerotic aortic arch. Mild stenosis origin of left common carotid artery. Atherosclerotic calcification left subclavian artery. Moderate stenosis left subclavian artery distal to the vertebral origin. Right carotid system: Postop right carotid endarterectomy. No significant right carotid stenosis Left carotid system: Mild stenosis at the origin and proximal aspect of the left common carotid artery unchanged. Mild atherosclerotic disease left carotid bifurcation without significant stenosis. Vertebral arteries: Mild stenosis origin of right vertebral artery. No additional right vertebral artery stenosis to the skull base. Left vertebral artery patent to the skull base without significant stenosis. Skeleton: Mild cervical spine degenerative change. No acute skeletal abnormality. Compression fractures of T5 and T6 appear chronic. Other neck: Negative for mass or adenopathy in the neck. Upper chest: Chest CT from today reported separately. Review of the MIP images confirms the above findings CTA HEAD FINDINGS  Anterior circulation: Atherosclerotic calcification in the cavernous carotid bilaterally. Moderate stenosis of the supraclinoid internal carotid artery on the left. Both anterior cerebral arteries are patent without stenosis. Hypoplastic left A1 segment. Moderate stenosis right M1 segment.  Right MCA branches patent. Moderate stenosis mid and distal left  M1 segment. Left MCA branches patent with focal moderate stenosis in the sylvian fissure M2 branch unchanged. Posterior circulation: Both vertebral arteries patent to the basilar. PICA patent bilaterally. Moderate stenosis left vertebral artery at the left PICA level. Atherosclerotic disease in the basilar artery with moderate stenosis proximally. This is unchanged. Moderate stenosis of the proximal P2 branch bilaterally. Additional moderate stenosis in the distal PCA bilaterally. No aneurysm identified as question previously. Venous sinuses: Normal venous enhancement Anatomic variants: None Review of the MIP images confirms the above findings IMPRESSION: 1. Postop right carotid endarterectomy which is widely patent. 2. Mild stenosis proximal left common carotid artery. No significant stenosis in the left carotid bifurcation 3. Both vertebral arteries patent to the basilar with moderate stenosis left V4 segment unchanged. In addition, there is moderate stenosis proximal basilar and moderate stenosis in the posterior cerebral arteries bilaterally. 4. Negative for intracranial large vessel occlusion. 5. Moderate stenosis in the middle cerebral arteries bilaterally unchanged. Electronically Signed   By: Marlan Palau M.D.   On: 09/21/2019 09:59   CT ANGIO CHEST PE W OR WO CONTRAST  Result Date: 09/21/2019 CLINICAL DATA:  Shortness of breath EXAM: CT ANGIOGRAPHY CHEST WITH CONTRAST TECHNIQUE: Multidetector CT imaging of the chest was performed using the standard protocol during bolus administration of intravenous contrast. Multiplanar CT image reconstructions and  MIPs were obtained to evaluate the vascular anatomy. CONTRAST:  50 mL OMNIPAQUE IOHEXOL 350 MG/ML SOLN COMPARISON:  Chest radiograph Sep 21, 2019; chest CT April 21, 2019 FINDINGS: Cardiovascular: There is no demonstrable pulmonary embolus. There is no thoracic aortic aneurysm or dissection. There are scattered foci of calcification in visualized great vessels. There are foci of aortic atherosclerosis. There are multiple foci of coronary artery calcification. There is no pericardial effusion or pericardial thickening. Mediastinum/Nodes: Thyroid appears unremarkable. There are scattered subcentimeter mediastinal lymph nodes. There are lymph nodes in the subcarinal region, largest measuring 1.2 x 1.2 cm. There are lymph nodes anterior to the carina, largest measuring 1.6 x 1.3 cm. There is an aortopulmonary window lymph node measuring 1.8 x 1.1 cm. There is a lymph node in the inferior right hilum measuring 1.1 x 1.0 cm. There is fluid in portions of the mid to distal esophagus. Lungs/Pleura: There is a degree of underlying centrilobular emphysematous change there is extensive ground-glass type opacity throughout the lungs bilaterally, most severe in the upper lobes somewhat posteriorly but present to varying degrees throughout the lungs bilaterally. There are moderate free-flowing pleural effusions bilaterally as well. There is a degree of underlying fibrosis, primarily in the lung bases. There is also a degree of lower lobe bronchiectatic change Upper Abdomen: There is reflux of contrast into the inferior vena cava and hepatic veins. Visualized upper abdominal structures otherwise appear unremarkable. Musculoskeletal: There is slight anterior wedging of several lower thoracic vertebral bodies. There is no appreciable blastic or lytic bone lesion. No evident chest wall lesion. Review of the MIP images confirms the above findings. IMPRESSION: 1. No demonstrable pulmonary embolus. No thoracic aortic aneurysm or  dissection. There is aortic atherosclerosis as well as foci of great vessel and coronary artery calcification. 2. Multifocal airspace opacity consistent with pneumonia throughout the lungs bilaterally. Moderate free-flowing pleural effusions. No appreciable consolidation. Question atypical organism pneumonia. In this regard, advise check of COVID-19 status. 3. Underlying degree of centrilobular emphysema. There is a degree of fibrosis, primarily in the lung bases as well as lower lobe bronchiectatic change. There may be a degree of underlying usual  interstitial pneumonitis in the presence of emphysematous change. 4. Several enlarged lymph nodes which are of uncertain etiology. These lymph nodes potentially may have reactive etiology given the extensive parenchymal lung abnormalities. 5. Fluid in the mid to distal esophagus which may represent spontaneous reflux 1 motility disorder. 6. Reflux of contrast into the inferior vena cava and hepatic veins may be indicative of increased right heart pressure. Aortic Atherosclerosis (ICD10-I70.0) and Emphysema (ICD10-J43.9). Electronically Signed   By: Bretta BangWilliam  Woodruff III M.D.   On: 09/21/2019 09:41   MR BRAIN WO CONTRAST  Result Date: 09/20/2019 CLINICAL DATA:  Initial evaluation for acute left upper extremity weakness. EXAM: MRI HEAD WITHOUT CONTRAST MRI CERVICAL SPINE WITHOUT CONTRAST TECHNIQUE: Multiplanar, multiecho pulse sequences of the brain and surrounding structures, and cervical spine, to include the craniocervical junction and cervicothoracic junction, were obtained without intravenous contrast. COMPARISON:  Comparison made with prior CT from earlier the same day. FINDINGS: MRI HEAD FINDINGS Brain: Generalized age-related cerebral atrophy. Extensive chronic microvascular ischemic disease with multiple remote lacunar infarcts seen clustered about the bilateral basal ganglia and thalami. Patchy involvement of the pons noted as well. 8 mm focus of restricted  diffusion involving the left caudate consistent with an acute ischemic infarct (series 5, image 8). Associated susceptibility artifact suggests petechial hemorrhage without frank hemorrhagic transformation. No additional punctate 4 mm cortical infarct noted at the left occipital lobe (a series 5, image 76). No associated hemorrhage or mass effect. Additional faint subcentimeter diffusion abnormality at the high right frontal centrum semi ovale consistent with subacute small vessel ischemia. Mild residual diffusion abnormality seen about a largely chronic appearing lacunar infarct at the posterior right basal ganglia. Additional 12 mm vague focus of diffusion abnormality involving the left cerebellum consistent with a subacute left cerebellar infarct. No associated hemorrhage or mass effect. Multiple scattered chronic micro hemorrhages seen involving both cerebral and cerebellar hemispheres, most notable about the deep gray nuclei, favored to be related to chronic poorly controlled hypertension. Sequelae of cerebral amyloid would be the primary differential consideration. No mass lesion, midline shift or mass effect. No hydrocephalus. No extra-axial fluid collection. Pituitary gland suprasellar region normal. Midline structures intact. Vascular: Major intracranial vascular flow voids are maintained. Skull and upper cervical spine: Craniocervical junction within normal limits. Bone marrow signal intensity normal. No scalp soft tissue abnormality. Sinuses/Orbits: Globes and orbital soft tissues within normal limits. Paranasal sinuses are largely clear. No significant mastoid effusion. Other: None. MRI CERVICAL SPINE FINDINGS Alignment: Vertebral bodies normally aligned with preservation of the normal cervical lordosis. No listhesis. Vertebrae: Vertebral body height maintained without evidence for acute or chronic fracture. Bone marrow signal intensity somewhat heterogeneous but within normal limits. No discrete or  worrisome osseous lesions. No abnormal marrow edema. Cord: Signal intensity within the cervical spinal cord is within normal limits. Posterior Fossa, vertebral arteries, paraspinal tissues: Craniocervical junction within normal limits. Paraspinous and prevertebral soft tissues normal. Normal flow voids seen within the vertebral arteries bilaterally. Disc levels: C2-C3: Unremarkable. C3-C4: Broad central disc protrusion indents the ventral thecal sac, partially effacing the ventral CSF. Mild spinal stenosis without cord deformity. Superimposed uncovertebral hypertrophy without significant foraminal encroachment. C4-C5: Diffuse degenerative disc osteophyte with bilateral uncovertebral hypertrophy. Broad posterior component flattens and effaces the ventral thecal sac, greater on the left. Superimposed mild facet and ligament flavum hypertrophy. Resultant moderate spinal stenosis with mild cord flattening. Left greater than right uncinate spurring with resultant moderate left worse than right C5 foraminal stenosis. C5-C6: Chronic intervertebral disc space  narrowing with diffuse degenerative disc osteophyte. Broad posterior component flattens and partially faces the ventral thecal sac resultant mild spinal stenosis. No cord deformity. Severe right with mild left C6 foraminal stenosis. C6-C7: Mild disc bulge with uncovertebral hypertrophy. No significant spinal stenosis. Moderate right C7 foraminal stenosis. No significant left foraminal narrowing. C7-T1:  Unremarkable. Visualized upper thoracic spine demonstrates no significant finding. IMPRESSION: MRI HEAD IMPRESSION: 1. 8 mm acute ischemic left basal ganglia infarct. Associated petechial hemorrhage without frank hemorrhagic transformation. 2. Additional small volume subacute ischemic changes involving the high right frontal centrum semi ovale and left cerebellum as above. 3. Underlying severe chronic microvascular ischemic disease with multiple remote lacunar infarcts  involving the deep gray nuclei and pons. 4. Multiple scattered chronic micro hemorrhages involving both cerebral and cerebellar hemispheres, most pronounced about the deep gray nuclei. Changes related to chronic poorly controlled hypertension is favored. Cerebral amyloid would be the primary differential consideration. MRI CERVICAL SPINE IMPRESSION: 1. Degenerative disc osteophyte at C4-5 with resultant moderate spinal stenosis, with moderate left worse than right C5 foraminal narrowing. Finding could contribute to left-sided radicular symptoms. 2. Disc bulge with uncovertebral spurring at C5-6 and C6-7 with resultant moderate to severe right C6 and C7 foraminal stenosis. 3. Broad-based central disc protrusion at C3-4 with resultant mild spinal stenosis. Electronically Signed   By: Rise Mu M.D.   On: 09/20/2019 22:41   MR Cervical Spine Wo Contrast  Result Date: 09/20/2019 CLINICAL DATA:  Initial evaluation for acute left upper extremity weakness. EXAM: MRI HEAD WITHOUT CONTRAST MRI CERVICAL SPINE WITHOUT CONTRAST TECHNIQUE: Multiplanar, multiecho pulse sequences of the brain and surrounding structures, and cervical spine, to include the craniocervical junction and cervicothoracic junction, were obtained without intravenous contrast. COMPARISON:  Comparison made with prior CT from earlier the same day. FINDINGS: MRI HEAD FINDINGS Brain: Generalized age-related cerebral atrophy. Extensive chronic microvascular ischemic disease with multiple remote lacunar infarcts seen clustered about the bilateral basal ganglia and thalami. Patchy involvement of the pons noted as well. 8 mm focus of restricted diffusion involving the left caudate consistent with an acute ischemic infarct (series 5, image 8). Associated susceptibility artifact suggests petechial hemorrhage without frank hemorrhagic transformation. No additional punctate 4 mm cortical infarct noted at the left occipital lobe (a series 5, image 76). No  associated hemorrhage or mass effect. Additional faint subcentimeter diffusion abnormality at the high right frontal centrum semi ovale consistent with subacute small vessel ischemia. Mild residual diffusion abnormality seen about a largely chronic appearing lacunar infarct at the posterior right basal ganglia. Additional 12 mm vague focus of diffusion abnormality involving the left cerebellum consistent with a subacute left cerebellar infarct. No associated hemorrhage or mass effect. Multiple scattered chronic micro hemorrhages seen involving both cerebral and cerebellar hemispheres, most notable about the deep gray nuclei, favored to be related to chronic poorly controlled hypertension. Sequelae of cerebral amyloid would be the primary differential consideration. No mass lesion, midline shift or mass effect. No hydrocephalus. No extra-axial fluid collection. Pituitary gland suprasellar region normal. Midline structures intact. Vascular: Major intracranial vascular flow voids are maintained. Skull and upper cervical spine: Craniocervical junction within normal limits. Bone marrow signal intensity normal. No scalp soft tissue abnormality. Sinuses/Orbits: Globes and orbital soft tissues within normal limits. Paranasal sinuses are largely clear. No significant mastoid effusion. Other: None. MRI CERVICAL SPINE FINDINGS Alignment: Vertebral bodies normally aligned with preservation of the normal cervical lordosis. No listhesis. Vertebrae: Vertebral body height maintained without evidence for acute or chronic fracture.  Bone marrow signal intensity somewhat heterogeneous but within normal limits. No discrete or worrisome osseous lesions. No abnormal marrow edema. Cord: Signal intensity within the cervical spinal cord is within normal limits. Posterior Fossa, vertebral arteries, paraspinal tissues: Craniocervical junction within normal limits. Paraspinous and prevertebral soft tissues normal. Normal flow voids seen within  the vertebral arteries bilaterally. Disc levels: C2-C3: Unremarkable. C3-C4: Broad central disc protrusion indents the ventral thecal sac, partially effacing the ventral CSF. Mild spinal stenosis without cord deformity. Superimposed uncovertebral hypertrophy without significant foraminal encroachment. C4-C5: Diffuse degenerative disc osteophyte with bilateral uncovertebral hypertrophy. Broad posterior component flattens and effaces the ventral thecal sac, greater on the left. Superimposed mild facet and ligament flavum hypertrophy. Resultant moderate spinal stenosis with mild cord flattening. Left greater than right uncinate spurring with resultant moderate left worse than right C5 foraminal stenosis. C5-C6: Chronic intervertebral disc space narrowing with diffuse degenerative disc osteophyte. Broad posterior component flattens and partially faces the ventral thecal sac resultant mild spinal stenosis. No cord deformity. Severe right with mild left C6 foraminal stenosis. C6-C7: Mild disc bulge with uncovertebral hypertrophy. No significant spinal stenosis. Moderate right C7 foraminal stenosis. No significant left foraminal narrowing. C7-T1:  Unremarkable. Visualized upper thoracic spine demonstrates no significant finding. IMPRESSION: MRI HEAD IMPRESSION: 1. 8 mm acute ischemic left basal ganglia infarct. Associated petechial hemorrhage without frank hemorrhagic transformation. 2. Additional small volume subacute ischemic changes involving the high right frontal centrum semi ovale and left cerebellum as above. 3. Underlying severe chronic microvascular ischemic disease with multiple remote lacunar infarcts involving the deep gray nuclei and pons. 4. Multiple scattered chronic micro hemorrhages involving both cerebral and cerebellar hemispheres, most pronounced about the deep gray nuclei. Changes related to chronic poorly controlled hypertension is favored. Cerebral amyloid would be the primary differential  consideration. MRI CERVICAL SPINE IMPRESSION: 1. Degenerative disc osteophyte at C4-5 with resultant moderate spinal stenosis, with moderate left worse than right C5 foraminal narrowing. Finding could contribute to left-sided radicular symptoms. 2. Disc bulge with uncovertebral spurring at C5-6 and C6-7 with resultant moderate to severe right C6 and C7 foraminal stenosis. 3. Broad-based central disc protrusion at C3-4 with resultant mild spinal stenosis. Electronically Signed   By: Rise Mu M.D.   On: 09/20/2019 22:41   CT ABDOMEN PELVIS W CONTRAST  Result Date: 09/24/2019 CLINICAL DATA:  Weight loss EXAM: CT ABDOMEN AND PELVIS WITH CONTRAST TECHNIQUE: Multidetector CT imaging of the abdomen and pelvis was performed using the standard protocol following bolus administration of intravenous contrast. CONTRAST:  80mL OMNIPAQUE IOHEXOL 300 MG/ML  SOLN COMPARISON:  04/21/2019 FINDINGS: Lower chest: Subpleural reticulation/fibrosis at the lung bases, suggesting chronic interstitial lung disease. Small bilateral pleural effusions, new. Hepatobiliary: Liver is notable for a stable probable vascular malformation in the lateral segment left hepatic lobe (series 3/image 22). Gallbladder is unremarkable. No intrahepatic or extrahepatic ductal dilatation. Pancreas: Within normal limits. Spleen: Within normal limits. Adrenals/Urinary Tract: Adrenal glands are within normal limits. 5 mm nonobstructing left upper pole renal calculus (series 3/image 22). Right kidney is within normal limits. No hydronephrosis. Bladder is within normal limits. Stomach/Bowel: Stomach is within normal limits. No evidence of bowel obstruction. Appendix is not discretely visualized, reportedly surgically absent. Moderate colonic stool burden, suggesting constipation. Vascular/Lymphatic: No evidence of abdominal aortic aneurysm. Atherosclerotic calcifications of the abdominal aorta and branch vessels. No suspicious abdominopelvic  lymphadenopathy. Reproductive: Status post hysterectomy. Left ovary is within normal limits.  No right adnexal mass. Other: No abdominopelvic ascites. Mild body wall  edema. Soft tissue calcifications in the visualized soft tissues, including the bilateral gluteal regions, chronic. Musculoskeletal: Mild superior endplate compression fracture deformity at T9, chronic. Lumbar spine is within normal limits. IMPRESSION: Moderate colonic stool burden, suggesting constipation. 5 mm nonobstructing left upper pole renal calculus. No hydronephrosis. Small bilateral pleural effusions. No CT findings to account for the patient's weight loss. Electronically Signed   By: Julian Hy M.D.   On: 09/24/2019 05:22   US RENAL  Result Date: 10/06/2019 CLINICAL DATA:  Acute renal injury EXAM: RENAL / URINARY TRACT ULTRASOUND COMPLETE COMPARISON:  None. FINDINGS: Right Kidney: Renal measurements: 9.2 x 3.6 x 3.0 cm = volume: 51 mL. Mild increased echogenicity is noted without hydronephrosis. Left Kidney: Renal measurements: 10.3 x 4.2 x 2.6 cm = volume: 59 mL. Mild increased echogenicity is noted. No mass lesion or hydronephrosis is noted. Bladder: Bladder is partially distended. Other: None. IMPRESSION: Increased echogenicity bilaterally consistent with medical renal disease. Electronically Signed   By: Inez Catalina M.D.   On: 10/06/2019 01:29   DG Chest Port 1 View  Result Date: October 21, 2019 CLINICAL DATA:  74 year old female with acute respiratory distress EXAM: PORTABLE CHEST 1 VIEW COMPARISON:  Chest radiograph dated 10/01/2019. FINDINGS: Diffuse interstitial and hazy airspace density. Overall interval progression of the pulmonary opacities primarily involving the right upper lobe and left lung most consistent with pneumonia. Clinical correlation and follow-up recommended. No pleural effusion or pneumothorax. Stable cardiomediastinal silhouette. Atherosclerotic calcification of the aortic arch. No acute osseous pathology.  IMPRESSION: Interval worsening airspace disease. Clinical correlation and follow-up recommended. Electronically Signed   By: Anner Crete M.D.   On: 10/21/2019 19:42   DG CHEST PORT 1 VIEW  Result Date: 09/21/2019 CLINICAL DATA:  Left hand paralysis weakness EXAM: PORTABLE CHEST 1 VIEW COMPARISON:  09/11/2019, 04/19/2019, CT chest 04/21/2019 FINDINGS: Diffuse bilateral reticular and ground-glass opacity. Stable cardiomediastinal silhouette. No pleural effusion or pneumothorax. IMPRESSION: Interval diffuse bilateral interstitial and ground-glass opacity some of which is due to chronic change though suspect acute superimposed process such as possible atypical or viral pneumonia. Electronically Signed   By: Donavan Foil M.D.   On: 09/21/2019 00:34   DG Chest Port 1 View  Result Date: 09/11/2019 CLINICAL DATA:  Weakness EXAM: PORTABLE CHEST 1 VIEW COMPARISON:  04/19/2019 FINDINGS: The lung volumes are somewhat low. There are hazy ill-defined opacities at the lung bases bilaterally. There is no pneumothorax. No large pleural effusion. The heart size is normal. Aortic calcifications are noted. There is no acute osseous abnormality. IMPRESSION: Low lung volumes with bibasilar atelectasis. Electronically Signed   By: Constance Holster M.D.   On: 09/11/2019 21:34   DG Abd 2 Views  Result Date: 09/13/2019 CLINICAL DATA:  Diarrhea. EXAM: ABDOMEN - 2 VIEW COMPARISON:  CT abdomen pelvis dated April 21, 2019. FINDINGS: The bowel gas pattern is normal. No excessive colonic stool burden. There is no evidence of free air. Unchanged small calculus in the upper pole of the left kidney. Chronic interstitial changes at the lung bases again noted. Bilateral gluteal dystrophic calcifications again noted. No acute osseous abnormality. IMPRESSION: 1. No acute findings.  No excessive colonic stool burden. 2. Unchanged left nephrolithiasis. Electronically Signed   By: Titus Dubin M.D.   On: 09/13/2019 10:35   DG Abd  Portable 1V  Result Date: 09/28/2019 CLINICAL DATA:  Constipation, history kidney stones, hysterectomy EXAM: PORTABLE ABDOMEN - 1 VIEW COMPARISON:  At 09/21/2019 FINDINGS: Increased stool throughout colon especially mid transverse colon through  rectum. No bowel dilatation or bowel wall thickening. Bones demineralized. Questionable LEFT renal calculus 5 mm diameter. IMPRESSION: Significantly increased stool in the distal half of the colon and rectum. Electronically Signed   By: Ulyses Southward M.D.   On: 09/28/2019 13:04   ECHOCARDIOGRAM COMPLETE  Result Date: 09/22/2019    ECHOCARDIOGRAM REPORT   Patient Name:   GENESI STEFANKO Date of Exam: 09/22/2019 Medical Rec #:  [id number]        Height:       61.0 in Accession #:    [id number]       Weight:       107.6 lb Date of Birth:  01-15-1946         BSA:          1.451 m Patient Age:    74 years         BP:           169/86 mmHg Patient Gender: F                HR:           77 bpm. Exam Location:  Inpatient Procedure: 2D Echo Indications:    stroke 434.91  History:        Patient has prior history of Echocardiogram examinations, most                 recent 04/19/2019. Risk Factors:Hypertension.  Sonographer:    Delcie Roch Referring Phys: [id number] ANASTASSIA DOUTOVA IMPRESSIONS  1. Left ventricular ejection fraction, by estimation, is 55 to 60%. The left ventricle has normal function. The left ventricle has no regional wall motion abnormalities. Left ventricular diastolic parameters were normal.  2. Right ventricular systolic function is normal. The right ventricular size is moderately enlarged. There is moderately elevated pulmonary artery systolic pressure. The estimated right ventricular systolic pressure is 45.0 mmHg.  3. Left atrial size was moderately dilated.  4. Right atrial size was mildly dilated.  5. The mitral valve is normal in structure. Mild to moderate mitral valve regurgitation. No evidence of mitral stenosis.  6. The aortic valve is normal in  structure. Aortic valve regurgitation is not visualized. No aortic stenosis is present.  7. Tricuspid valve regurgitation is moderate.  8. Pulmonic valve regurgitation is moderate.  9. The inferior vena cava is normal in size with greater than 50% respiratory variability, suggesting right atrial pressure of 3 mmHg. FINDINGS  Left Ventricle: Left ventricular ejection fraction, by estimation, is 55 to 60%. The left ventricle has normal function. The left ventricle has no regional wall motion abnormalities. The left ventricular internal cavity size was normal in size. There is  no left ventricular hypertrophy. Left ventricular diastolic parameters were normal. Right Ventricle: The right ventricular size is moderately enlarged. No increase in right ventricular wall thickness. Right ventricular systolic function is normal. There is moderately elevated pulmonary artery systolic pressure. The tricuspid regurgitant  velocity is 3.24 m/s, and with an assumed right atrial pressure of 3 mmHg, the estimated right ventricular systolic pressure is 45.0 mmHg. Left Atrium: Left atrial size was moderately dilated. Right Atrium: Right atrial size was mildly dilated. Pericardium: Trivial pericardial effusion is present. Mitral Valve: The mitral valve is normal in structure. Normal mobility of the mitral valve leaflets. Mild mitral annular calcification. Mild to moderate mitral valve regurgitation. No evidence of mitral valve stenosis. Tricuspid Valve: The tricuspid valve is normal in structure. Tricuspid valve regurgitation is moderate . No evidence of tricuspid  stenosis. Aortic Valve: The aortic valve is normal in structure. Aortic valve regurgitation is not visualized. No aortic stenosis is present. Pulmonic Valve: The pulmonic valve was normal in structure. Pulmonic valve regurgitation is moderate. No evidence of pulmonic stenosis. Aorta: The aortic root is normal in size and structure. Venous: The inferior vena cava is normal in  size with greater than 50% respiratory variability, suggesting right atrial pressure of 3 mmHg. IAS/Shunts: No atrial level shunt detected by color flow Doppler.  LEFT VENTRICLE PLAX 2D LVIDd:         4.16 cm     Diastology LVIDs:         2.56 cm     LV e' lateral:   10.10 cm/s LV PW:         0.95 cm     LV E/e' lateral: 9.1 LV IVS:        0.74 cm     LV e' medial:    8.49 cm/s LVOT diam:     1.90 cm     LV E/e' medial:  10.8 LV SV:         51 LV SV Index:   35 LVOT Area:     2.84 cm  LV Volumes (MOD) LV vol d, MOD A4C: 46.2 ml LV vol s, MOD A4C: 20.7 ml LV SV MOD A4C:     46.2 ml RIGHT VENTRICLE             IVC RV S prime:     13.50 cm/s  IVC diam: 1.67 cm TAPSE (M-mode): 1.9 cm LEFT ATRIUM             Index       RIGHT ATRIUM           Index LA diam:        3.50 cm 2.41 cm/m  RA Area:     15.00 cm LA Vol (A2C):   51.7 ml 35.63 ml/m RA Volume:   38.80 ml  26.74 ml/m LA Vol (A4C):   69.1 ml 47.62 ml/m LA Biplane Vol: 60.9 ml 41.96 ml/m  AORTIC VALVE LVOT Vmax:   92.60 cm/s LVOT Vmean:  54.300 cm/s LVOT VTI:    0.180 m  AORTA Ao Root diam: 2.60 cm MITRAL VALVE               TRICUSPID VALVE MV Area (PHT): 4.39 cm    TR Peak grad:   42.0 mmHg MV Decel Time: 173 msec    TR Vmax:        324.00 cm/s MV E velocity: 91.70 cm/s MV A velocity: 93.60 cm/s  SHUNTS MV E/A ratio:  0.98        Systemic VTI:  0.18 m                            Systemic Diam: 1.90 cm Weston Brass MD Electronically signed by Weston Brass MD Signature Date/Time: 09/22/2019/11:21:18 AM    Final    VAS [place] LOWER EXTREMITY VENOUS (DVT)  Result Date: 09/22/2019  Lower Venous DVTStudy Indications: Stroke.  Risk Factors: None identified. Limitations: Patient positioning, patient immobility. Comparison Study: No prior studies. Performing Technologist: Chanda Busing RVT  Examination Guidelines: A complete evaluation includes B-mode imaging, spectral Doppler, color Doppler, and power Doppler as needed of all accessible portions of each vessel.  Bilateral testing is considered an integral part of a complete examination. Limited examinations for reoccurring indications  may be performed as noted. The reflux portion of the exam is performed with the patient in reverse Trendelenburg.  +---------+---------------+---------+-----------+----------+--------------+ RIGHT    CompressibilityPhasicitySpontaneityPropertiesThrombus Aging +---------+---------------+---------+-----------+----------+--------------+ CFV      Full           Yes      Yes                                 +---------+---------------+---------+-----------+----------+--------------+ SFJ      Full                                                        +---------+---------------+---------+-----------+----------+--------------+ FV Prox  Full                                                        +---------+---------------+---------+-----------+----------+--------------+ FV Mid   Full                                                        +---------+---------------+---------+-----------+----------+--------------+ FV DistalFull                                                        +---------+---------------+---------+-----------+----------+--------------+ PFV      Full                                                        +---------+---------------+---------+-----------+----------+--------------+ POP      Full           Yes      Yes                                 +---------+---------------+---------+-----------+----------+--------------+ PTV      Full                                                        +---------+---------------+---------+-----------+----------+--------------+ PERO     Full                                                        +---------+---------------+---------+-----------+----------+--------------+   +---------+---------------+---------+-----------+----------+--------------+ LEFT      CompressibilityPhasicitySpontaneityPropertiesThrombus Aging +---------+---------------+---------+-----------+----------+--------------+ CFV      Full  Yes      Yes                                 +---------+---------------+---------+-----------+----------+--------------+ SFJ      Full                                                        +---------+---------------+---------+-----------+----------+--------------+ FV Prox  Full                                                        +---------+---------------+---------+-----------+----------+--------------+ FV Mid   Full                                                        +---------+---------------+---------+-----------+----------+--------------+ FV DistalFull                                                        +---------+---------------+---------+-----------+----------+--------------+ PFV      Full                                                        +---------+---------------+---------+-----------+----------+--------------+ POP      Full           Yes      Yes                                 +---------+---------------+---------+-----------+----------+--------------+ PTV      Full                                                        +---------+---------------+---------+-----------+----------+--------------+ PERO     Full                                                        +---------+---------------+---------+-----------+----------+--------------+     Summary: RIGHT: - There is no evidence of deep vein thrombosis in the lower extremity.  - No cystic structure found in the popliteal fossa.  LEFT: - There is no evidence of deep vein thrombosis in the lower extremity.  - No cystic structure found in the popliteal fossa.  *See table(s) above for measurements and observations. Electronically signed by Sherald Hess MD on 09/22/2019 at 5:17:46 PM.  Final      Microbiology Recent Results (from the past 240 hour(s))  Urine Culture     Status: Abnormal   Collection Time: 10/01/19  9:34 AM   Specimen: Urine, Catheterized  Result Value Ref Range Status   Specimen Description URINE, CATHETERIZED  Final   Special Requests   Final    NONE Performed at [facility], [address], [place], [place] [address]    Culture >=100,000 COLONIES/mL ENTEROCOCCUS FAECALIS (A)  Final   Report Status 10/03/2019 FINAL  Final   Organism ID, Bacteria ENTEROCOCCUS FAECALIS (A)  Final      Susceptibility   Enterococcus faecalis - MIC*    AMPICILLIN <=2 SENSITIVE Sensitive     NITROFURANTOIN <=16 SENSITIVE Sensitive     VANCOMYCIN 1 SENSITIVE Sensitive     * >=100,000 COLONIES/mL ENTEROCOCCUS FAECALIS  Culture, blood (Routine X 2) w Reflex to ID Panel     Status: None   Collection Time: 10/01/19  9:40 AM   Specimen: BLOOD  Result Value Ref Range Status   Specimen Description BLOOD SITE NOT SPECIFIED  Final   Special Requests   Final    BOTTLES DRAWN AEROBIC AND ANAEROBIC Blood Culture adequate volume   Culture   Final    NO GROWTH 5 DAYS Performed at [facility], [address], [place], [place] [address]    Report Status 10/06/2019 FINAL  Final  Culture, blood (Routine X 2) w Reflex to ID Panel     Status: None   Collection Time: 10/01/19  9:51 AM   Specimen: BLOOD  Result Value Ref Range Status   Specimen Description BLOOD SITE NOT SPECIFIED  Final   Special Requests   Final    BOTTLES DRAWN AEROBIC AND ANAEROBIC Blood Culture adequate volume   Culture   Final    NO GROWTH 5 DAYS Performed at [facility], [address], [place], [place] [address]    Report Status 10/06/2019 FINAL  Final  Culture, Urine     Status: None   Collection Time: 10-11-2019  8:02 PM   Specimen: Urine, Random  Result Value Ref Range Status   Specimen Description URINE, RANDOM  Final   Special Requests NONE  Final   Culture   Final    NO  GROWTH Performed at [facility], [address], [place], [place] [address]    Report Status 10/06/2019 FINAL  Final  Culture, blood (routine x 2)     Status: None (Preliminary result)   Collection Time: Oct 11, 2019 11:33 PM   Specimen: BLOOD  Result Value Ref Range Status   Specimen Description BLOOD LEFT ARM  Final   Special Requests   Final    BOTTLES DRAWN AEROBIC ONLY Blood Culture adequate volume   Culture   Final    NO GROWTH < 12 HOURS Performed at [facility], [address], [place], [place] [address]    Report Status PENDING  Incomplete  Culture, blood (routine x 2)     Status: None (Preliminary result)   Collection Time: Oct 11, 2019 11:47 PM   Specimen: BLOOD  Result Value Ref Range Status   Specimen Description BLOOD LEFT FOREARM  Final   Special Requests   Final    BOTTLES DRAWN AEROBIC ONLY Blood Culture adequate volume   Culture   Final    NO GROWTH < 12 HOURS Performed at [facility], [address], [place], [place] [address]  Report Status PENDING  Incomplete    Lab Basic Metabolic Panel: Recent Labs  Lab 10/04/19 0508 10/04/19 0508 11-04-19 0851 11-04-19 1935 11-04-2019 2333 10/06/19 0437 10/06/19 0813  NA 141  --  141 144 143 145  --   K 3.6  --  4.1 4.2 3.7 3.5  --   CL 106  --  110 110 115* 116*  --   CO2 25  --  23 16* 18* 21*  --   GLUCOSE 95  --  122* 169* 171* 143*  --   BUN 58*  --  67* 80* 73* 72*  --   CREATININE 1.95*   < > 2.15* 2.45* 2.30* 2.34* 2.39*  CALCIUM 8.2*  --  8.0* 8.2* 7.5* 7.5*  --    < > = values in this interval not displayed.   Liver Function Tests: Recent Labs  Lab 04-Nov-2019 2333 10/06/19 0437  AST 48* 36  ALT 19 18  ALKPHOS 173* 157*  BILITOT 0.5 0.4  PROT 5.4* 5.1*  ALBUMIN 1.4* 1.3*   No results for input(s): LIPASE, AMYLASE in the last 168 hours. No results for input(s): AMMONIA in the last 168 hours. CBC: Recent Labs  Lab 10/01/19 0940 10/01/19 0940 10/03/19 0559 11-04-19 0851  04-Nov-2019 1935 11/04/2019 2333 10/06/19 0437  WBC 12.4*   < > 10.3 15.3* 19.4* 23.4* 20.5*  NEUTROABS 9.4*  --  7.3 12.9* 18.0*  --   --   HGB 7.8*   < > 7.3* 7.1* 7.6* 6.9* 6.9*  HCT 24.8*   < > 24.6* 23.6* 25.4* 23.5* 23.3*  MCV 90.8   < > 93.9 94.4 95.5 97.1 97.5  PLT 311   < > 305 375 496* 355 337   < > = values in this interval not displayed.   Cardiac Enzymes: No results for input(s): CKTOTAL, CKMB, CKMBINDEX, TROPONINI in the last 168 hours. Sepsis Labs: Recent Labs  Lab 10/01/19 0940 10/01/19 1248 10/03/19 0559 November 04, 2019 0851 2019/11/04 1935 2019/11/04 2333 11-04-2019 2347 10/06/19 0437  WBC 12.4*  --    < > 15.3* 19.4* 23.4*  --  20.5*  LATICACIDVEN 1.2 1.7  --   --  5.4*  --  2.9*  --    < > = values in this interval not displayed.    Procedures/Operations  None   Almon Hercules 10/09/2019, 9:47 AM

## 2019-11-03 DEATH — deceased

## 2019-11-15 ENCOUNTER — Ambulatory Visit: Payer: Medicare HMO | Admitting: Adult Health

## 2019-11-17 ENCOUNTER — Ambulatory Visit: Payer: Medicare HMO | Admitting: Physical Medicine and Rehabilitation

## 2019-11-18 ENCOUNTER — Ambulatory Visit: Payer: Medicare HMO | Admitting: Physical Medicine and Rehabilitation

## 2021-08-16 IMAGING — CR DG ABDOMEN 2V
2 series · 2 of 2 positions shown · non-contrast
Comparison: CT abdomen pelvis dated April 21, 2019.

CLINICAL DATA: Diarrhea.

EXAM:
ABDOMEN - 2 VIEW

[abdomen erect]
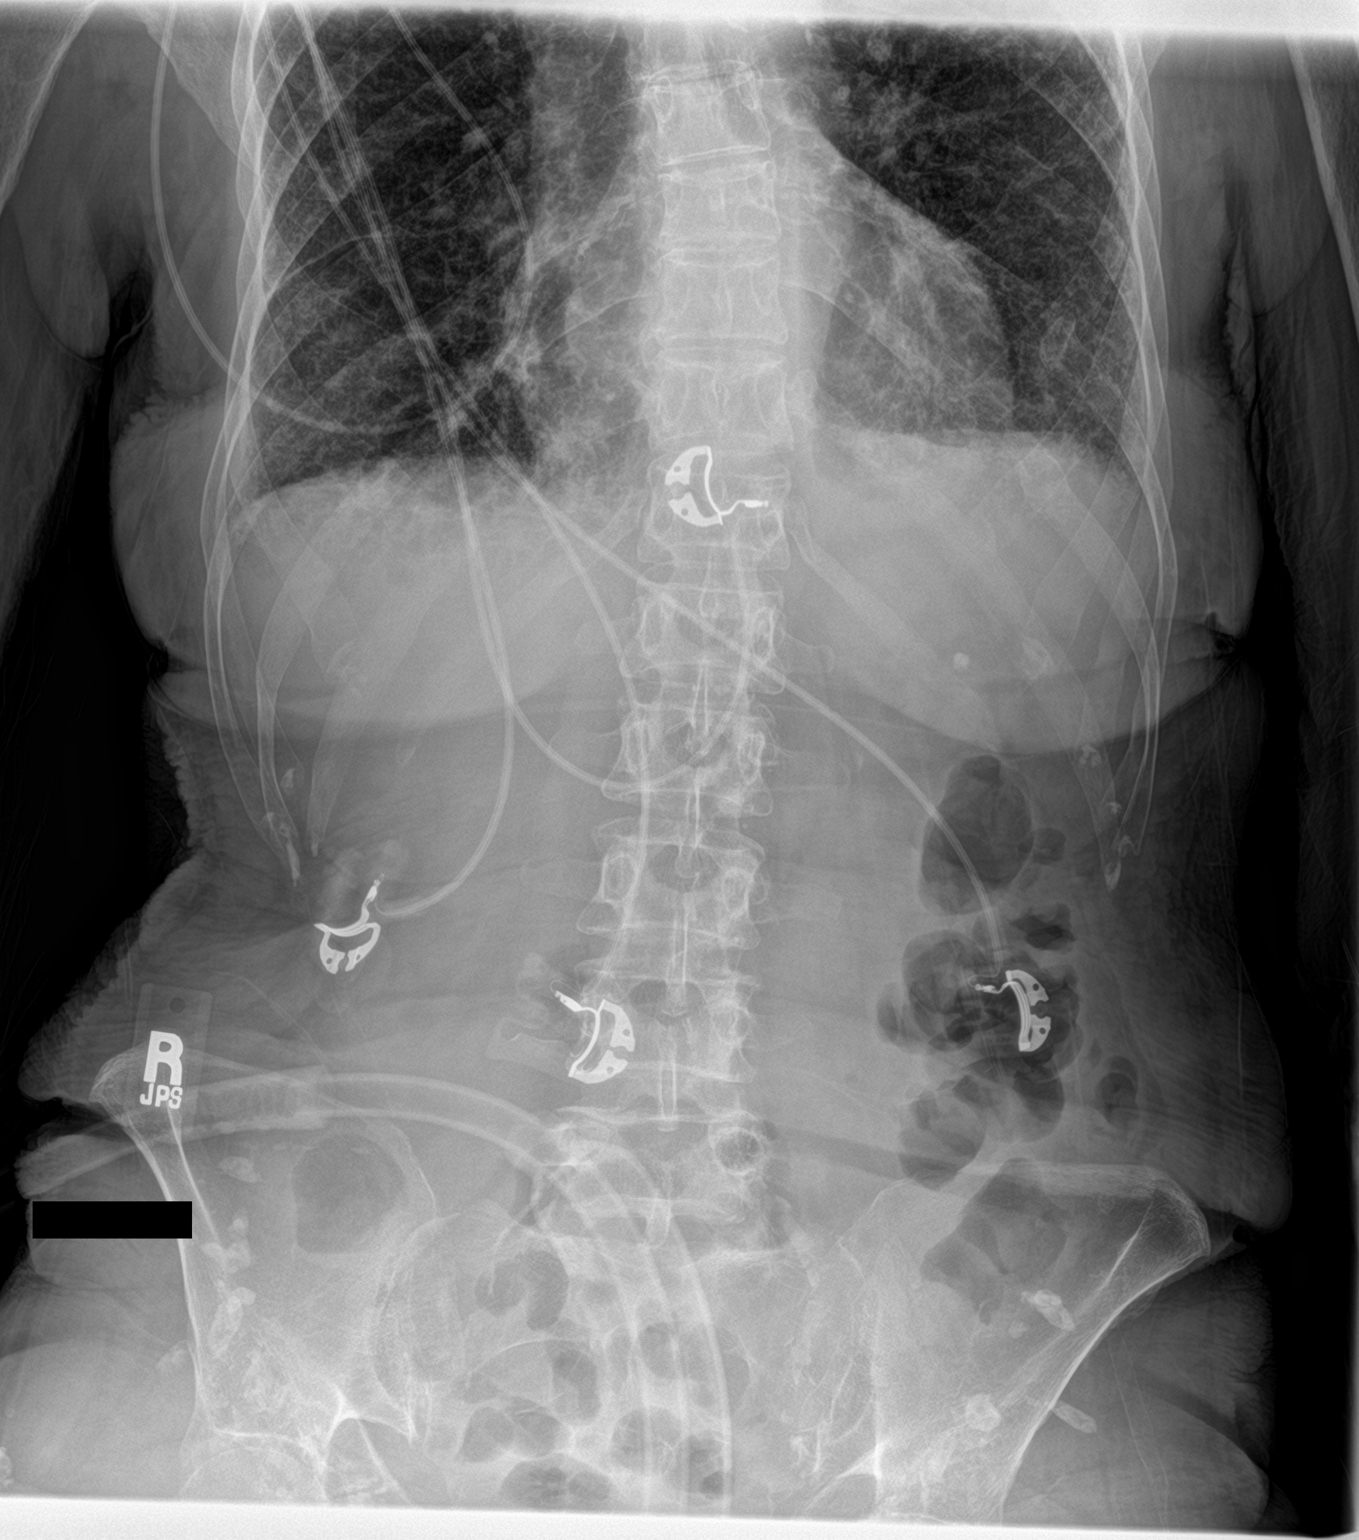

[abdomen supine]
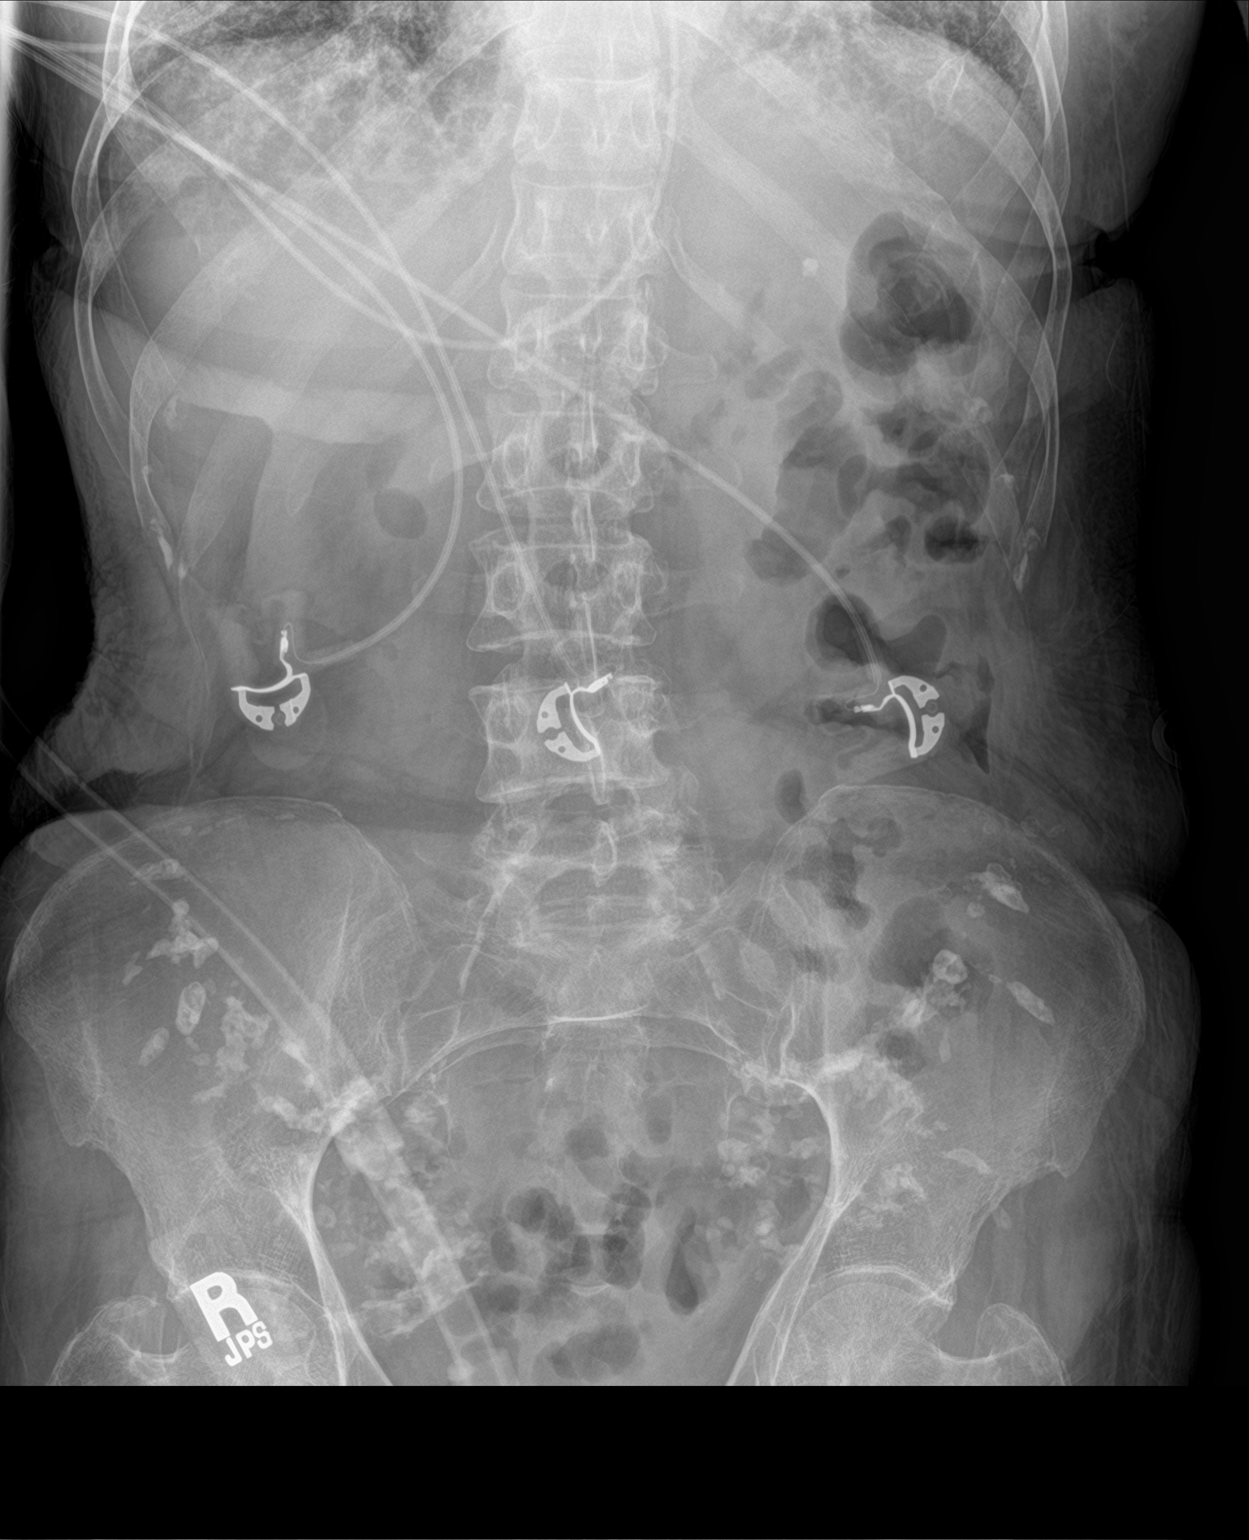

[2 of 2 positions shown; findings below may reference images not displayed]

FINDINGS: The bowel gas pattern is normal. No excessive colonic stool burden.
There is no evidence of free air. Unchanged small calculus in the
upper pole of the left kidney. Chronic interstitial changes at the
lung bases again noted. Bilateral gluteal dystrophic calcifications
again noted. No acute osseous abnormality.
IMPRESSION: 1. No acute findings.  No excessive colonic stool burden.
2. Unchanged left nephrolithiasis.

## 2021-08-24 IMAGING — CT CT ANGIO CHEST
2 of 7 series · 16 of 36 positions shown · IV contrast (omnipaque)
Comparison: Chest radiograph September 21, 2019; chest CT April 21, 2019

CLINICAL DATA: Shortness of breath

EXAM:
CT ANGIOGRAPHY CHEST WITH CONTRAST
TECHNIQUE: Multidetector CT imaging of the chest was performed using the
standard protocol during bolus administration of intravenous
contrast. Multiplanar CT image reconstructions and MIPs were
obtained to evaluate the vascular anatomy.
CONTRAST:  50 mL OMNIPAQUE IOHEXOL 350 MG/ML SOLN

[Series 6: pe thins · axial · 0.61mm/px · z∈[-494,-263]mm · 15 of 378 slices shown]
[im 24/378  lung]
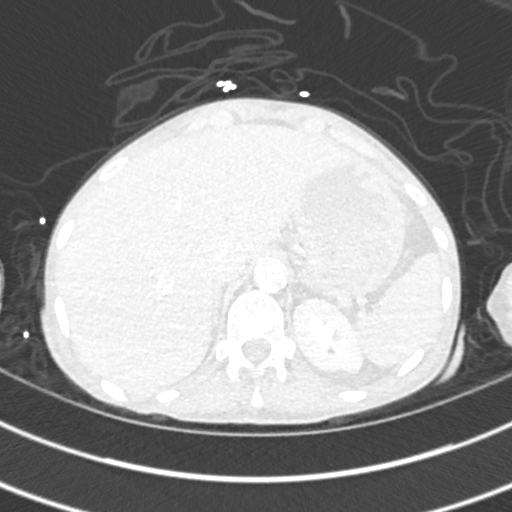
[im 48/378  mediastinal]
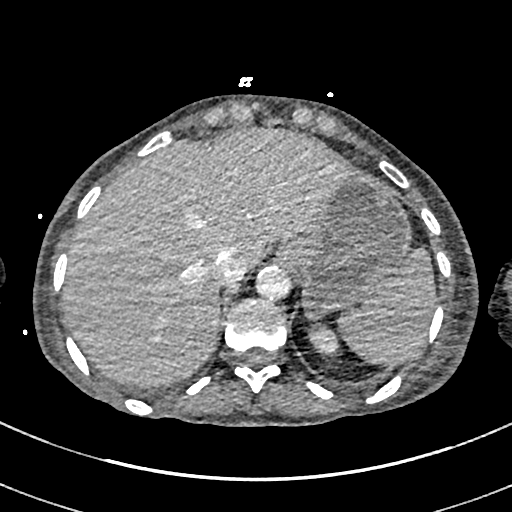
[im 71/378  lung]
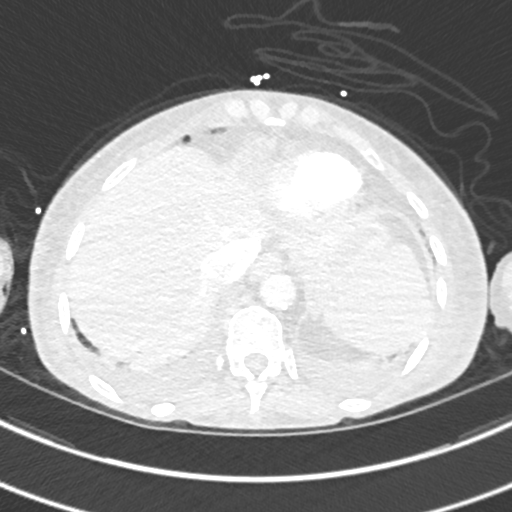
[im 95/378  mediastinal]
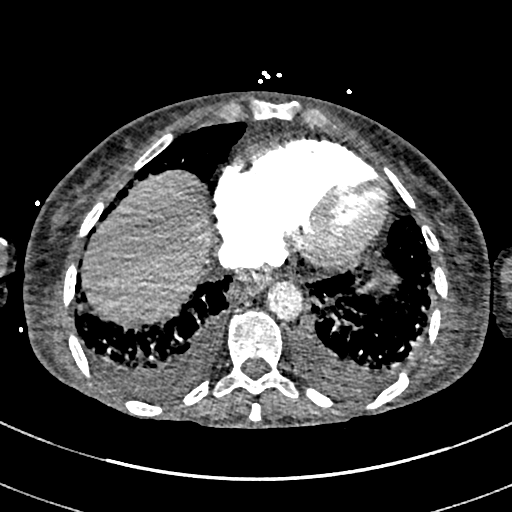
[im 118/378  lung]
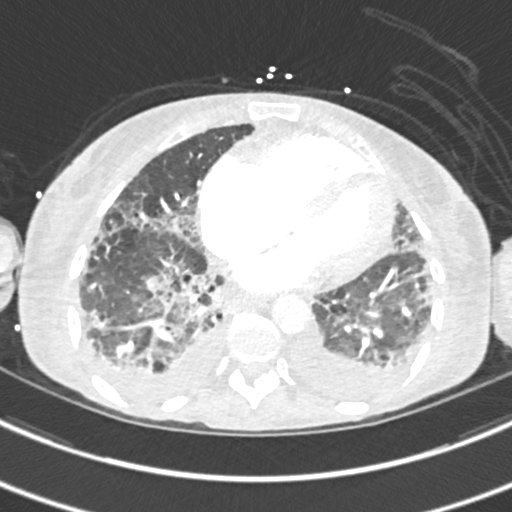
[im 142/378  mediastinal]
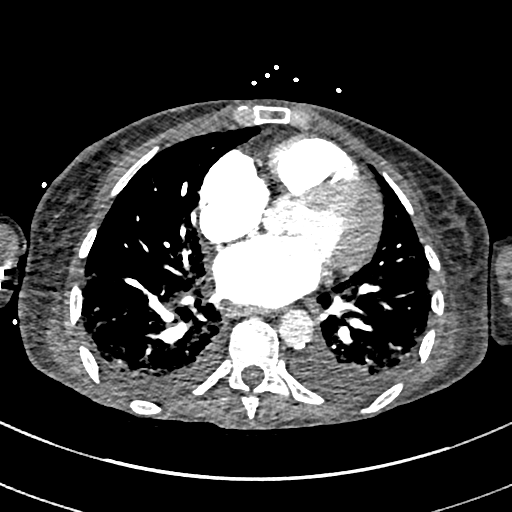
[im 165/378  lung]
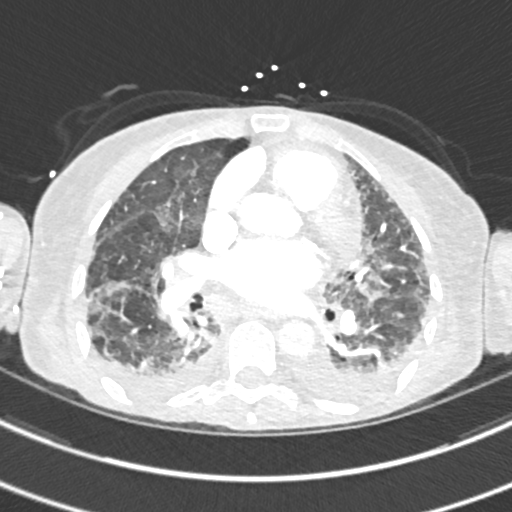
[im 189/378  mediastinal]
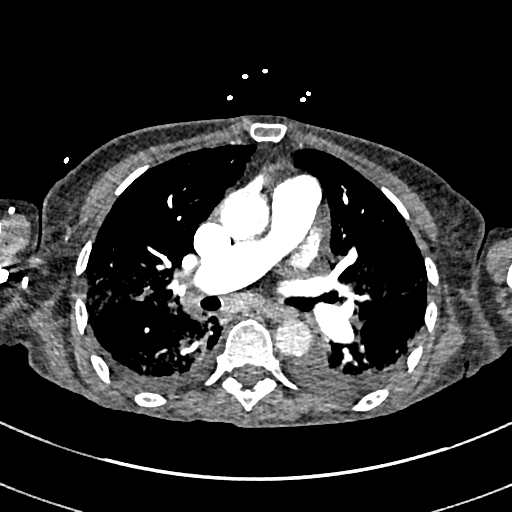
[im 213/378  lung]
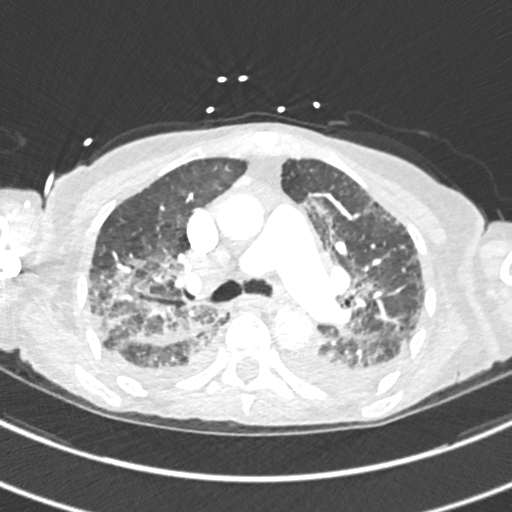
[im 236/378  mediastinal]
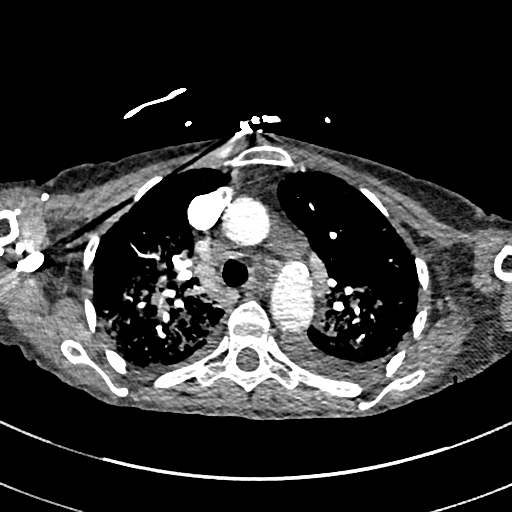
[im 260/378  lung]
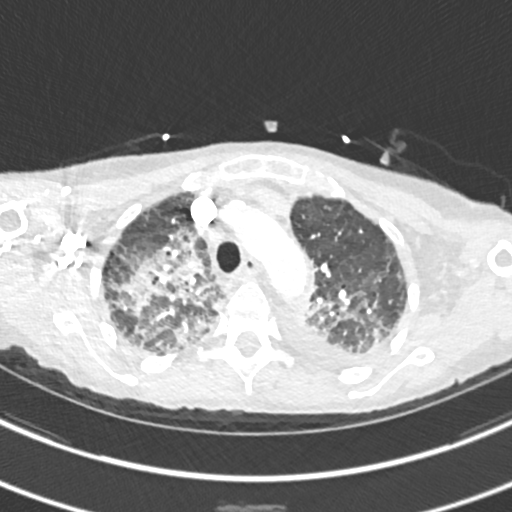
[im 283/378  mediastinal]
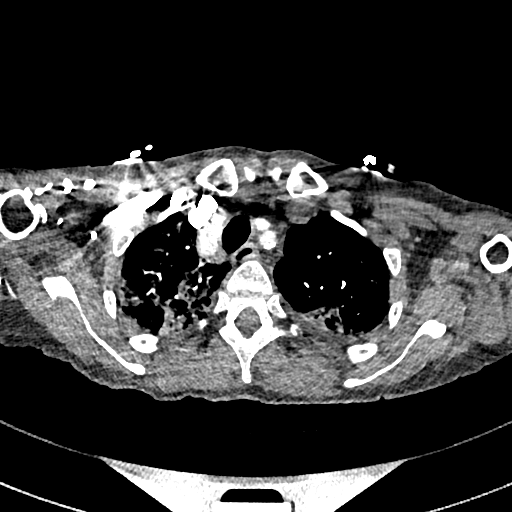
[im 307/378  lung]
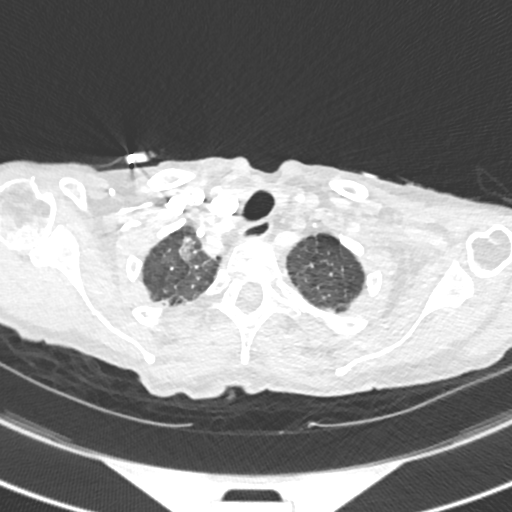
[im 330/378  mediastinal]
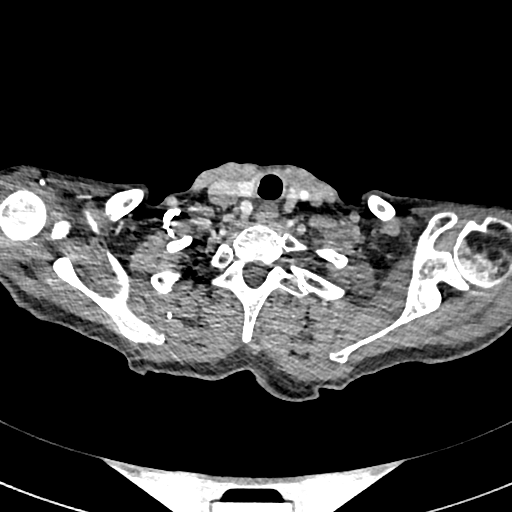
[im 354/378  lung]
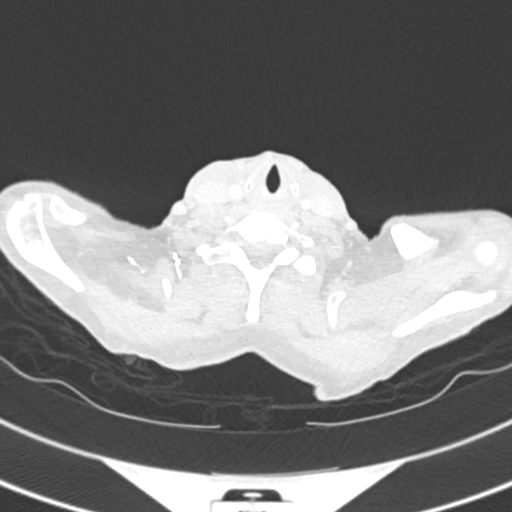

[Series 8: pe 2mm cor · coronal · 0.59mm/px · 1 of 141 slices shown]
[im 71/141  mediastinal]
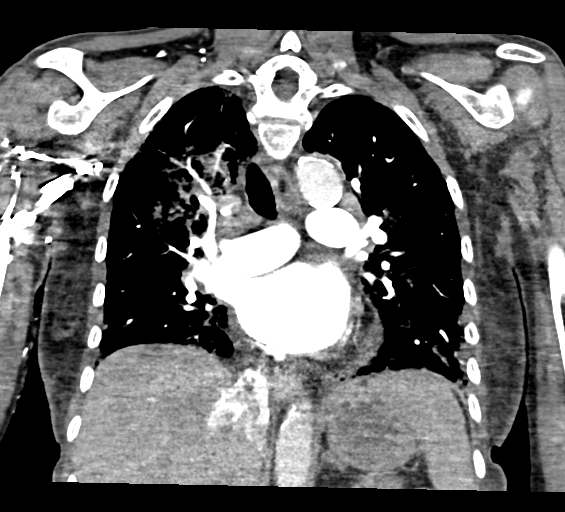

[16 of 36 positions shown; findings below may reference images not displayed]

FINDINGS: Cardiovascular: There is no demonstrable pulmonary embolus. There is
no thoracic aortic aneurysm or dissection. There are scattered foci
of calcification in visualized great vessels. There are foci of
aortic atherosclerosis. There are multiple foci of coronary artery
calcification. There is no pericardial effusion or pericardial
thickening.

Mediastinum/Nodes: Thyroid appears unremarkable. There are scattered
subcentimeter mediastinal lymph nodes. There are lymph nodes in the
subcarinal region, largest measuring 1.2 x 1.2 cm. There are lymph
nodes anterior to the carina, largest measuring 1.6 x 1.3 cm. There
is an aortopulmonary window lymph node measuring 1.8 x 1.1 cm. There
is a lymph node in the inferior right hilum measuring 1.1 x 1.0 cm.
There is fluid in portions of the mid to distal esophagus.

Lungs/Pleura: There is a degree of underlying centrilobular
emphysematous change there is extensive ground-glass type opacity
throughout the lungs bilaterally, most severe in the upper lobes
somewhat posteriorly but present to varying degrees throughout the
lungs bilaterally. There are moderate free-flowing pleural effusions
bilaterally as well. There is a degree of underlying fibrosis,
primarily in the lung bases. There is also a degree of lower lobe
bronchiectatic change

Upper Abdomen: There is reflux of contrast into the inferior vena
cava and hepatic veins. Visualized upper abdominal structures
otherwise appear unremarkable.

Musculoskeletal: There is slight anterior wedging of several lower
thoracic vertebral bodies. There is no appreciable blastic or lytic
bone lesion. No evident chest wall lesion.

Review of the MIP images confirms the above findings.
IMPRESSION: 1. No demonstrable pulmonary embolus. No thoracic aortic aneurysm or
dissection. There is aortic atherosclerosis as well as foci of great
vessel and coronary artery calcification.

2. Multifocal airspace opacity consistent with pneumonia throughout
the lungs bilaterally. Moderate free-flowing pleural effusions. No
appreciable consolidation. Question atypical organism pneumonia. In
this regard, advise check of [id number] status.

3. Underlying degree of centrilobular emphysema. There is a degree
of fibrosis, primarily in the lung bases as well as lower lobe
bronchiectatic change. There may be a degree of underlying usual
interstitial pneumonitis in the presence of emphysematous change.

4. Several enlarged lymph nodes which are of uncertain etiology.
These lymph nodes potentially may have reactive etiology given the
extensive parenchymal lung abnormalities.

5. Fluid in the mid to distal esophagus which may represent
spontaneous reflux 1 motility disorder.

6. Reflux of contrast into the inferior vena cava and hepatic veins
may be indicative of increased right heart pressure.

Aortic Atherosclerosis (70PEC-N2E.E) and Emphysema (70PEC-ELT.A).
